# Patient Record
Sex: Male | Born: 1950 | Race: White | Hispanic: No | Smoking: Never smoker
Health system: Southern US, Community
[De-identification: ages and names within clinical notes are randomized; demographics above are authoritative.]

## PROBLEM LIST (undated history)

## (undated) DIAGNOSIS — E079 Disorder of thyroid, unspecified: Secondary | ICD-10-CM

## (undated) DIAGNOSIS — F411 Generalized anxiety disorder: Secondary | ICD-10-CM

## (undated) DIAGNOSIS — S78119A Complete traumatic amputation at level between unspecified hip and knee, initial encounter: Secondary | ICD-10-CM

## (undated) DIAGNOSIS — Z87442 Personal history of urinary calculi: Secondary | ICD-10-CM

## (undated) DIAGNOSIS — N529 Male erectile dysfunction, unspecified: Secondary | ICD-10-CM

## (undated) DIAGNOSIS — S62109A Fracture of unspecified carpal bone, unspecified wrist, initial encounter for closed fracture: Secondary | ICD-10-CM

## (undated) DIAGNOSIS — K7460 Unspecified cirrhosis of liver: Secondary | ICD-10-CM

## (undated) DIAGNOSIS — B1710 Acute hepatitis C without hepatic coma: Secondary | ICD-10-CM

## (undated) DIAGNOSIS — G547 Phantom limb syndrome without pain: Secondary | ICD-10-CM

## (undated) DIAGNOSIS — G8929 Other chronic pain: Secondary | ICD-10-CM

## (undated) DIAGNOSIS — E785 Hyperlipidemia, unspecified: Secondary | ICD-10-CM

## (undated) HISTORY — DX: Male erectile dysfunction, unspecified: N52.9

## (undated) HISTORY — DX: Disorder of thyroid, unspecified: E07.9

## (undated) HISTORY — DX: Other chronic pain: G89.29

## (undated) HISTORY — DX: Hypocalcemia: E83.51

## (undated) HISTORY — DX: Unspecified cirrhosis of liver: K74.60

## (undated) HISTORY — PX: ABOVE KNEE LEG AMPUTATION: SUR20

## (undated) HISTORY — DX: Generalized anxiety disorder: F41.1

## (undated) HISTORY — DX: Personal history of urinary calculi: Z87.442

## (undated) HISTORY — DX: Phantom limb syndrome without pain: G54.7

## (undated) HISTORY — DX: Hyperlipidemia, unspecified: E78.5

## (undated) HISTORY — DX: Fracture of unspecified carpal bone, unspecified wrist, initial encounter for closed fracture: S62.109A

## (undated) HISTORY — DX: Complete traumatic amputation at level between unspecified hip and knee, initial encounter: S78.119A

## (undated) HISTORY — DX: Acute hepatitis C without hepatic coma: B17.10

---

## 1995-05-13 LAB — HM COLONOSCOPY

## 1999-05-24 ENCOUNTER — Encounter (HOSPITAL_COMMUNITY): Admission: RE | Admit: 1999-05-24 | Discharge: 1999-08-22 | Payer: Self-pay | Admitting: Gastroenterology

## 1999-08-31 ENCOUNTER — Encounter (HOSPITAL_COMMUNITY): Admission: RE | Admit: 1999-08-31 | Discharge: 1999-11-29 | Payer: Self-pay | Admitting: Gastroenterology

## 1999-11-27 ENCOUNTER — Encounter (HOSPITAL_COMMUNITY): Admission: RE | Admit: 1999-11-27 | Discharge: 2000-02-25 | Payer: Self-pay | Admitting: Gastroenterology

## 2004-08-21 ENCOUNTER — Emergency Department (HOSPITAL_COMMUNITY): Admission: EM | Admit: 2004-08-21 | Discharge: 2004-08-21 | Payer: Self-pay | Admitting: Family Medicine

## 2005-03-15 ENCOUNTER — Emergency Department (HOSPITAL_COMMUNITY): Admission: EM | Admit: 2005-03-15 | Discharge: 2005-03-16 | Payer: Self-pay | Admitting: Emergency Medicine

## 2005-03-19 ENCOUNTER — Inpatient Hospital Stay (HOSPITAL_COMMUNITY): Admission: EM | Admit: 2005-03-19 | Discharge: 2005-04-02 | Payer: Self-pay | Admitting: *Deleted

## 2005-03-19 ENCOUNTER — Ambulatory Visit: Payer: Self-pay | Admitting: Infectious Diseases

## 2005-03-20 ENCOUNTER — Encounter (INDEPENDENT_AMBULATORY_CARE_PROVIDER_SITE_OTHER): Payer: Self-pay | Admitting: *Deleted

## 2005-03-26 ENCOUNTER — Ambulatory Visit: Payer: Self-pay | Admitting: Cardiology

## 2005-03-30 ENCOUNTER — Encounter (INDEPENDENT_AMBULATORY_CARE_PROVIDER_SITE_OTHER): Payer: Self-pay | Admitting: *Deleted

## 2005-06-26 ENCOUNTER — Ambulatory Visit: Payer: Self-pay | Admitting: Endocrinology

## 2005-07-02 ENCOUNTER — Encounter: Admission: RE | Admit: 2005-07-02 | Discharge: 2005-09-07 | Payer: Self-pay | Admitting: Orthopedic Surgery

## 2005-07-10 ENCOUNTER — Ambulatory Visit: Payer: Self-pay | Admitting: Oncology

## 2005-07-17 ENCOUNTER — Ambulatory Visit: Payer: Self-pay | Admitting: Gastroenterology

## 2005-07-27 ENCOUNTER — Encounter (HOSPITAL_COMMUNITY): Payer: Self-pay | Admitting: Oncology

## 2005-07-27 ENCOUNTER — Encounter (INDEPENDENT_AMBULATORY_CARE_PROVIDER_SITE_OTHER): Payer: Self-pay | Admitting: *Deleted

## 2005-07-27 ENCOUNTER — Ambulatory Visit (HOSPITAL_COMMUNITY): Admission: RE | Admit: 2005-07-27 | Discharge: 2005-07-27 | Payer: Self-pay | Admitting: Oncology

## 2005-07-27 ENCOUNTER — Ambulatory Visit: Payer: Self-pay | Admitting: Oncology

## 2005-07-31 ENCOUNTER — Ambulatory Visit: Payer: Self-pay | Admitting: Gastroenterology

## 2005-09-06 ENCOUNTER — Ambulatory Visit: Payer: Self-pay | Admitting: Endocrinology

## 2005-10-05 ENCOUNTER — Ambulatory Visit: Payer: Self-pay | Admitting: Oncology

## 2005-11-19 LAB — CBC WITH DIFFERENTIAL/PLATELET
BASO%: 0.5 % (ref 0.0–2.0)
EOS%: 2.6 % (ref 0.0–7.0)
LYMPH%: 29.8 % (ref 14.0–48.0)
MCH: 37.4 pg — ABNORMAL HIGH (ref 28.0–33.4)
MCHC: 35.6 g/dL (ref 32.0–35.9)
MONO#: 0.3 10*3/uL (ref 0.1–0.9)
RBC: 3.21 10*6/uL — ABNORMAL LOW (ref 4.20–5.71)
WBC: 3.1 10*3/uL — ABNORMAL LOW (ref 4.0–10.0)
lymph#: 0.9 10*3/uL (ref 0.9–3.3)

## 2005-11-22 LAB — LACTATE DEHYDROGENASE: LDH: 209 U/L (ref 94–250)

## 2005-11-22 LAB — COMPREHENSIVE METABOLIC PANEL
ALT: 34 U/L (ref 0–40)
AST: 28 U/L (ref 0–37)
CO2: 21 mEq/L (ref 19–32)
Chloride: 104 mEq/L (ref 96–112)
Creatinine, Ser: 0.8 mg/dL (ref 0.4–1.5)
Sodium: 137 mEq/L (ref 135–145)
Total Bilirubin: 1.7 mg/dL — ABNORMAL HIGH (ref 0.3–1.2)
Total Protein: 7.7 g/dL (ref 6.0–8.3)

## 2005-11-22 LAB — PNH PROFILE (-HIGH SENSITIVITY)
% CD55: 100 %
% CD59: 100 %

## 2005-11-23 LAB — HAPTOGLOBIN: Haptoglobin: <6 mg/dL — ABNORMAL LOW (ref 16–200)

## 2005-11-23 LAB — HEMOCHROMATOSIS DNA-PCR(C282Y,H63D)

## 2006-02-14 ENCOUNTER — Ambulatory Visit: Payer: Self-pay | Admitting: Oncology

## 2006-05-04 ENCOUNTER — Emergency Department (HOSPITAL_COMMUNITY): Admission: EM | Admit: 2006-05-04 | Discharge: 2006-05-04 | Payer: Self-pay | Admitting: Emergency Medicine

## 2006-05-24 ENCOUNTER — Inpatient Hospital Stay (HOSPITAL_COMMUNITY): Admission: EM | Admit: 2006-05-24 | Discharge: 2006-05-31 | Payer: Self-pay | Admitting: *Deleted

## 2006-05-27 ENCOUNTER — Encounter (INDEPENDENT_AMBULATORY_CARE_PROVIDER_SITE_OTHER): Payer: Self-pay | Admitting: Interventional Cardiology

## 2006-05-28 ENCOUNTER — Encounter (INDEPENDENT_AMBULATORY_CARE_PROVIDER_SITE_OTHER): Payer: Self-pay | Admitting: Specialist

## 2006-06-03 ENCOUNTER — Ambulatory Visit: Payer: Self-pay | Admitting: Endocrinology

## 2006-06-23 ENCOUNTER — Inpatient Hospital Stay (HOSPITAL_COMMUNITY): Admission: EM | Admit: 2006-06-23 | Discharge: 2006-06-25 | Payer: Self-pay | Admitting: Emergency Medicine

## 2006-07-06 ENCOUNTER — Observation Stay (HOSPITAL_COMMUNITY): Admission: EM | Admit: 2006-07-06 | Discharge: 2006-07-07 | Payer: Self-pay | Admitting: Emergency Medicine

## 2006-07-07 ENCOUNTER — Ambulatory Visit: Payer: Self-pay | Admitting: Internal Medicine

## 2006-07-07 ENCOUNTER — Ambulatory Visit: Payer: Self-pay | Admitting: Oncology

## 2006-07-09 ENCOUNTER — Ambulatory Visit: Payer: Self-pay | Admitting: Oncology

## 2006-07-09 LAB — FERRITIN: Ferritin: 1082 ng/mL — ABNORMAL HIGH (ref 22–322)

## 2006-07-09 LAB — CBC & DIFF AND RETIC
Basophils Absolute: 0 10*3/uL (ref 0.0–0.1)
Eosinophils Absolute: 0.1 10*3/uL (ref 0.0–0.5)
HCT: 26.5 % — ABNORMAL LOW (ref 38.7–49.9)
IRF: 0.49 — ABNORMAL HIGH (ref 0.070–0.380)
LYMPH%: 22.2 % (ref 14.0–48.0)
MCH: 31 pg (ref 28.0–33.4)
MCV: 89.7 fL (ref 81.6–98.0)
MONO%: 5.5 % (ref 0.0–13.0)
NEUT#: 2.3 10*3/uL (ref 1.5–6.5)
NEUT%: 69 % (ref 40.0–75.0)
Platelets: 81 10*3/uL — ABNORMAL LOW (ref 145–400)
RDW: 18.7 % — ABNORMAL HIGH (ref 11.2–14.6)
RETIC #: 127.3 10*3/uL — ABNORMAL HIGH (ref 31.8–103.9)
WBC: 3.4 10*3/uL — ABNORMAL LOW (ref 4.0–10.0)
lymph#: 0.8 10*3/uL — ABNORMAL LOW (ref 0.9–3.3)

## 2006-07-09 LAB — MORPHOLOGY: PLT EST: DECREASED

## 2006-07-09 LAB — COMPREHENSIVE METABOLIC PANEL
AST: 14 U/L (ref 0–37)
CO2: 25 mEq/L (ref 19–32)
Chloride: 99 mEq/L (ref 96–112)
Glucose, Bld: 124 mg/dL — ABNORMAL HIGH (ref 70–99)
Total Bilirubin: 2.2 mg/dL — ABNORMAL HIGH (ref 0.3–1.2)
Total Protein: 8.1 g/dL (ref 6.0–8.3)

## 2006-07-09 LAB — ERYTHROCYTE SEDIMENTATION RATE: Sed Rate: 104 mm/hr — ABNORMAL HIGH (ref 0–20)

## 2006-07-09 LAB — IRON AND TIBC
%SAT: 22 % (ref 20–55)
TIBC: 277 ug/dL (ref 215–435)

## 2006-07-09 LAB — HAPTOGLOBIN: Haptoglobin: 154 mg/dL (ref 16–200)

## 2006-07-09 LAB — CHCC SMEAR

## 2006-07-15 ENCOUNTER — Encounter (HOSPITAL_COMMUNITY): Admission: RE | Admit: 2006-07-15 | Discharge: 2006-07-25 | Payer: Self-pay | Admitting: Oncology

## 2006-07-15 LAB — CBC WITH DIFFERENTIAL/PLATELET
BASO%: 0.4 % (ref 0.0–2.0)
EOS%: 2.7 % (ref 0.0–7.0)
MCH: 31.3 pg (ref 28.0–33.4)
MCHC: 34.7 g/dL (ref 32.0–35.9)
MONO#: 0.2 10*3/uL (ref 0.1–0.9)
RBC: 2.84 10*6/uL — ABNORMAL LOW (ref 4.20–5.71)
RDW: 18.8 % — ABNORMAL HIGH (ref 11.2–14.6)
WBC: 3.3 10*3/uL — ABNORMAL LOW (ref 4.0–10.0)
lymph#: 0.7 10*3/uL — ABNORMAL LOW (ref 0.9–3.3)

## 2006-07-17 LAB — TYPE & CROSSMATCH - CHCC

## 2006-07-26 LAB — CBC & DIFF AND RETIC
BASO%: 0.5 % (ref 0.0–2.0)
EOS%: 3.2 % (ref 0.0–7.0)
HCT: 33.7 % — ABNORMAL LOW (ref 38.7–49.9)
LYMPH%: 20.6 % (ref 14.0–48.0)
MCH: 31 pg (ref 28.0–33.4)
MCHC: 34.6 g/dL (ref 32.0–35.9)
MCV: 89.6 fL (ref 81.6–98.0)
MONO%: 7.1 % (ref 0.0–13.0)
NEUT%: 68.6 % (ref 40.0–75.0)
lymph#: 1 10*3/uL (ref 0.9–3.3)

## 2006-07-26 LAB — COMPREHENSIVE METABOLIC PANEL
Albumin: 3.9 g/dL (ref 3.5–5.2)
BUN: 13 mg/dL (ref 6–23)
CO2: 26 mEq/L (ref 19–32)
Calcium: 8.6 mg/dL (ref 8.4–10.5)
Chloride: 100 mEq/L (ref 96–112)
Glucose, Bld: 102 mg/dL — ABNORMAL HIGH (ref 70–99)
Potassium: 3.8 mEq/L (ref 3.5–5.3)
Total Protein: 7.3 g/dL (ref 6.0–8.3)

## 2006-07-26 LAB — HAPTOGLOBIN: Haptoglobin: 53 mg/dL (ref 16–200)

## 2006-08-02 LAB — CBC WITH DIFFERENTIAL/PLATELET
BASO%: 0.2 % (ref 0.0–2.0)
Basophils Absolute: 0 10*3/uL (ref 0.0–0.1)
HCT: 30.6 % — ABNORMAL LOW (ref 38.7–49.9)
HGB: 10.4 g/dL — ABNORMAL LOW (ref 13.0–17.1)
LYMPH%: 18.7 % (ref 14.0–48.0)
MCHC: 33.9 g/dL (ref 32.0–35.9)
MONO#: 0.1 10*3/uL (ref 0.1–0.9)
NEUT%: 70.2 % (ref 40.0–75.0)
Platelets: 66 10*3/uL — ABNORMAL LOW (ref 145–400)
WBC: 2.5 10*3/uL — ABNORMAL LOW (ref 4.0–10.0)

## 2006-08-27 ENCOUNTER — Ambulatory Visit: Payer: Self-pay | Admitting: Oncology

## 2006-10-14 ENCOUNTER — Ambulatory Visit: Payer: Self-pay | Admitting: Oncology

## 2006-10-16 LAB — CBC WITH DIFFERENTIAL/PLATELET
BASO%: 0.8 % (ref 0.0–2.0)
Basophils Absolute: 0 10*3/uL (ref 0.0–0.1)
EOS%: 2.3 % (ref 0.0–7.0)
HCT: 27.1 % — ABNORMAL LOW (ref 38.7–49.9)
LYMPH%: 24.5 % (ref 14.0–48.0)
MCH: 36.1 pg — ABNORMAL HIGH (ref 28.0–33.4)
MCHC: 36.3 g/dL — ABNORMAL HIGH (ref 32.0–35.9)
MCV: 99.6 fL — ABNORMAL HIGH (ref 81.6–98.0)
MONO%: 9.8 % (ref 0.0–13.0)
NEUT%: 62.6 % (ref 40.0–75.0)
Platelets: 69 10*3/uL — ABNORMAL LOW (ref 145–400)
lymph#: 0.8 10*3/uL — ABNORMAL LOW (ref 0.9–3.3)

## 2006-11-15 LAB — CBC WITH DIFFERENTIAL/PLATELET
Eosinophils Absolute: 0.1 10*3/uL (ref 0.0–0.5)
HCT: 29.8 % — ABNORMAL LOW (ref 38.7–49.9)
HGB: 10.7 g/dL — ABNORMAL LOW (ref 13.0–17.1)
LYMPH%: 24.6 % (ref 14.0–48.0)
MONO#: 0.3 10*3/uL (ref 0.1–0.9)
NEUT#: 3 10*3/uL (ref 1.5–6.5)
Platelets: 66 10*3/uL — ABNORMAL LOW (ref 145–400)
RBC: 3.07 10*6/uL — ABNORMAL LOW (ref 4.20–5.71)
WBC: 4.6 10*3/uL (ref 4.0–10.0)

## 2006-11-26 ENCOUNTER — Ambulatory Visit: Payer: Self-pay | Admitting: Oncology

## 2006-11-28 LAB — CBC WITH DIFFERENTIAL/PLATELET
BASO%: 0.4 % (ref 0.0–2.0)
Eosinophils Absolute: 0.1 10*3/uL (ref 0.0–0.5)
HCT: 25.9 % — ABNORMAL LOW (ref 38.7–49.9)
LYMPH%: 26.3 % (ref 14.0–48.0)
MCHC: 36 g/dL — ABNORMAL HIGH (ref 32.0–35.9)
MONO#: 0.4 10*3/uL (ref 0.1–0.9)
NEUT%: 61.3 % (ref 40.0–75.0)
Platelets: 84 10*3/uL — ABNORMAL LOW (ref 145–400)
WBC: 3.8 10*3/uL — ABNORMAL LOW (ref 4.0–10.0)

## 2006-12-12 LAB — CBC WITH DIFFERENTIAL/PLATELET
BASO%: 0.8 % (ref 0.0–2.0)
EOS%: 2.1 % (ref 0.0–7.0)
HCT: 30.1 % — ABNORMAL LOW (ref 38.7–49.9)
LYMPH%: 18.6 % (ref 14.0–48.0)
MCH: 34.1 pg — ABNORMAL HIGH (ref 28.0–33.4)
MCHC: 35.1 g/dL (ref 32.0–35.9)
MCV: 97.3 fL (ref 81.6–98.0)
MONO%: 4.7 % (ref 0.0–13.0)
NEUT%: 73.8 % (ref 40.0–75.0)
Platelets: 81 10*3/uL — ABNORMAL LOW (ref 145–400)

## 2006-12-26 LAB — CBC WITH DIFFERENTIAL/PLATELET
BASO%: 0.2 % (ref 0.0–2.0)
EOS%: 2.5 % (ref 0.0–7.0)
MCH: 32.3 pg (ref 28.0–33.4)
MCHC: 35.9 g/dL (ref 32.0–35.9)
MONO%: 4 % (ref 0.0–13.0)
NEUT%: 81.3 % — ABNORMAL HIGH (ref 40.0–75.0)
RDW: 20.5 % — ABNORMAL HIGH (ref 11.2–14.6)
lymph#: 0.6 10*3/uL — ABNORMAL LOW (ref 0.9–3.3)

## 2007-01-09 LAB — CBC WITH DIFFERENTIAL/PLATELET
Basophils Absolute: 0 10*3/uL (ref 0.0–0.1)
EOS%: 4.4 % (ref 0.0–7.0)
HGB: 11.1 g/dL — ABNORMAL LOW (ref 13.0–17.1)
MCH: 33.1 pg (ref 28.0–33.4)
NEUT#: 2.3 10*3/uL (ref 1.5–6.5)
RBC: 3.36 10*6/uL — ABNORMAL LOW (ref 4.20–5.71)
RDW: 22.3 % — ABNORMAL HIGH (ref 11.2–14.6)
lymph#: 0.6 10*3/uL — ABNORMAL LOW (ref 0.9–3.3)

## 2007-01-09 LAB — COMPREHENSIVE METABOLIC PANEL
ALT: 20 U/L (ref 0–53)
AST: 20 U/L (ref 0–37)
Alkaline Phosphatase: 97 U/L (ref 39–117)
BUN: 10 mg/dL (ref 6–23)
Creatinine, Ser: 0.63 mg/dL (ref 0.40–1.50)
Total Bilirubin: 1.9 mg/dL — ABNORMAL HIGH (ref 0.3–1.2)

## 2007-01-09 LAB — ERYTHROCYTE SEDIMENTATION RATE: Sed Rate: 46 mm/hr — ABNORMAL HIGH (ref 0–20)

## 2007-01-14 ENCOUNTER — Ambulatory Visit: Payer: Self-pay | Admitting: Oncology

## 2007-01-30 LAB — CBC WITH DIFFERENTIAL/PLATELET
Basophils Absolute: 0 10*3/uL (ref 0.0–0.1)
Eosinophils Absolute: 0.1 10*3/uL (ref 0.0–0.5)
HGB: 11.4 g/dL — ABNORMAL LOW (ref 13.0–17.1)
LYMPH%: 37.8 % (ref 14.0–48.0)
MCV: 97.8 fL (ref 81.6–98.0)
MONO#: 0.2 10*3/uL (ref 0.1–0.9)
MONO%: 8.1 % (ref 0.0–13.0)
NEUT#: 1 10*3/uL — ABNORMAL LOW (ref 1.5–6.5)
Platelets: 42 10*3/uL — ABNORMAL LOW (ref 145–400)
RDW: 22.8 % — ABNORMAL HIGH (ref 11.2–14.6)
WBC: 1.9 10*3/uL — ABNORMAL LOW (ref 4.0–10.0)

## 2007-02-20 LAB — COMPREHENSIVE METABOLIC PANEL
Albumin: 4.9 g/dL (ref 3.5–5.2)
Alkaline Phosphatase: 107 U/L (ref 39–117)
CO2: 24 mEq/L (ref 19–32)
Calcium: 9 mg/dL (ref 8.4–10.5)
Chloride: 106 mEq/L (ref 96–112)
Glucose, Bld: 96 mg/dL (ref 70–99)
Potassium: 3.8 mEq/L (ref 3.5–5.3)
Sodium: 142 mEq/L (ref 135–145)
Total Protein: 7.9 g/dL (ref 6.0–8.3)

## 2007-02-20 LAB — CBC WITH DIFFERENTIAL/PLATELET
Basophils Absolute: 0 10*3/uL (ref 0.0–0.1)
Eosinophils Absolute: 0.1 10*3/uL (ref 0.0–0.5)
HCT: 37.3 % — ABNORMAL LOW (ref 38.7–49.9)
HGB: 13.5 g/dL (ref 13.0–17.1)
MONO#: 0.3 10*3/uL (ref 0.1–0.9)
NEUT#: 2.1 10*3/uL (ref 1.5–6.5)
RDW: 20.4 % — ABNORMAL HIGH (ref 11.2–14.6)
lymph#: 1.2 10*3/uL (ref 0.9–3.3)

## 2007-03-04 ENCOUNTER — Encounter: Admission: RE | Admit: 2007-03-04 | Discharge: 2007-06-02 | Payer: Self-pay | Admitting: Orthopedic Surgery

## 2007-03-11 ENCOUNTER — Ambulatory Visit: Payer: Self-pay | Admitting: Oncology

## 2007-03-24 ENCOUNTER — Encounter: Payer: Self-pay | Admitting: Endocrinology

## 2007-03-24 DIAGNOSIS — B1710 Acute hepatitis C without hepatic coma: Secondary | ICD-10-CM | POA: Insufficient documentation

## 2007-03-24 DIAGNOSIS — F419 Anxiety disorder, unspecified: Secondary | ICD-10-CM | POA: Insufficient documentation

## 2007-03-24 DIAGNOSIS — F411 Generalized anxiety disorder: Secondary | ICD-10-CM

## 2007-03-24 DIAGNOSIS — K7460 Unspecified cirrhosis of liver: Secondary | ICD-10-CM

## 2007-03-24 HISTORY — DX: Acute hepatitis C without hepatic coma: B17.10

## 2007-03-24 HISTORY — DX: Generalized anxiety disorder: F41.1

## 2007-03-24 HISTORY — DX: Unspecified cirrhosis of liver: K74.60

## 2007-04-22 ENCOUNTER — Ambulatory Visit: Payer: Self-pay | Admitting: Oncology

## 2007-05-22 ENCOUNTER — Encounter: Admission: RE | Admit: 2007-05-22 | Discharge: 2007-07-30 | Payer: Self-pay | Admitting: Orthopedic Surgery

## 2007-06-30 LAB — CONVERTED CEMR LAB: PSA: NORMAL ng/mL

## 2007-07-04 ENCOUNTER — Ambulatory Visit: Payer: Self-pay | Admitting: Oncology

## 2007-07-04 ENCOUNTER — Encounter: Payer: Self-pay | Admitting: Endocrinology

## 2007-07-04 LAB — ERYTHROCYTE SEDIMENTATION RATE: Sed Rate: 22 mm/hr — ABNORMAL HIGH (ref 0–20)

## 2007-07-04 LAB — COMPREHENSIVE METABOLIC PANEL
ALT: 92 U/L — ABNORMAL HIGH (ref 0–53)
AST: 67 U/L — ABNORMAL HIGH (ref 0–37)
Albumin: 4.4 g/dL (ref 3.5–5.2)
Alkaline Phosphatase: 99 U/L (ref 39–117)
Glucose, Bld: 124 mg/dL — ABNORMAL HIGH (ref 70–99)
Potassium: 3.7 mEq/L (ref 3.5–5.3)
Sodium: 139 mEq/L (ref 135–145)
Total Protein: 7.4 g/dL (ref 6.0–8.3)

## 2007-07-04 LAB — CBC WITH DIFFERENTIAL/PLATELET
BASO%: 2.9 % — ABNORMAL HIGH (ref 0.0–2.0)
LYMPH%: 22.5 % (ref 14.0–48.0)
MCHC: 36.5 g/dL — ABNORMAL HIGH (ref 32.0–35.9)
MONO#: 0.3 10*3/uL (ref 0.1–0.9)
MONO%: 9.6 % (ref 0.0–13.0)
Platelets: 56 10*3/uL — ABNORMAL LOW (ref 145–400)
RBC: 3.19 10*6/uL — ABNORMAL LOW (ref 4.20–5.71)
WBC: 2.7 10*3/uL — ABNORMAL LOW (ref 4.0–10.0)

## 2007-07-08 LAB — PSA, MEDICARE: PSA: 0.51 ng/mL (ref 0.10–4.00)

## 2007-10-30 ENCOUNTER — Ambulatory Visit: Payer: Self-pay | Admitting: Oncology

## 2007-11-04 ENCOUNTER — Encounter: Payer: Self-pay | Admitting: Endocrinology

## 2007-11-04 LAB — CBC WITH DIFFERENTIAL/PLATELET
BASO%: 1.1 % (ref 0.0–2.0)
EOS%: 2.8 % (ref 0.0–7.0)
HCT: 33.3 % — ABNORMAL LOW (ref 38.7–49.9)
MCHC: 36.4 g/dL — ABNORMAL HIGH (ref 32.0–35.9)
NEUT#: 1.2 10*3/uL — ABNORMAL LOW (ref 1.5–6.5)
WBC: 2.4 10*3/uL — ABNORMAL LOW (ref 4.0–10.0)
lymph#: 0.9 10*3/uL (ref 0.9–3.3)

## 2007-11-04 LAB — COMPREHENSIVE METABOLIC PANEL
AST: 48 U/L — ABNORMAL HIGH (ref 0–37)
BUN: 9 mg/dL (ref 6–23)
CO2: 26 mEq/L (ref 19–32)
Calcium: 9.2 mg/dL (ref 8.4–10.5)
Chloride: 105 mEq/L (ref 96–112)
Glucose, Bld: 122 mg/dL — ABNORMAL HIGH (ref 70–99)

## 2007-12-23 ENCOUNTER — Ambulatory Visit: Payer: Self-pay | Admitting: Endocrinology

## 2007-12-23 DIAGNOSIS — S78119A Complete traumatic amputation at level between unspecified hip and knee, initial encounter: Secondary | ICD-10-CM

## 2007-12-23 DIAGNOSIS — Z87442 Personal history of urinary calculi: Secondary | ICD-10-CM

## 2007-12-23 DIAGNOSIS — N529 Male erectile dysfunction, unspecified: Secondary | ICD-10-CM | POA: Insufficient documentation

## 2007-12-23 HISTORY — DX: Personal history of urinary calculi: Z87.442

## 2007-12-23 HISTORY — DX: Male erectile dysfunction, unspecified: N52.9

## 2007-12-23 HISTORY — DX: Hypocalcemia: E83.51

## 2007-12-23 HISTORY — DX: Complete traumatic amputation at level between unspecified hip and knee, initial encounter: S78.119A

## 2007-12-24 ENCOUNTER — Ambulatory Visit: Payer: Self-pay | Admitting: Endocrinology

## 2007-12-24 LAB — CONVERTED CEMR LAB
BUN: 9 mg/dL (ref 6–23)
CO2: 27 meq/L (ref 19–32)
Calcium: 9.4 mg/dL (ref 8.4–10.5)
Chloride: 108 meq/L (ref 96–112)
GFR calc non Af Amer: 123 mL/min
Glucose, Bld: 95 mg/dL (ref 70–99)

## 2007-12-25 LAB — CONVERTED CEMR LAB: Calcium, Total (PTH): 9.4 mg/dL (ref 8.4–10.5)

## 2008-02-21 ENCOUNTER — Other Ambulatory Visit: Payer: Self-pay

## 2008-02-21 ENCOUNTER — Emergency Department: Payer: Self-pay | Admitting: Emergency Medicine

## 2008-02-27 ENCOUNTER — Encounter: Payer: Self-pay | Admitting: Endocrinology

## 2008-03-02 ENCOUNTER — Ambulatory Visit: Payer: Self-pay | Admitting: Oncology

## 2009-04-28 ENCOUNTER — Encounter: Payer: Self-pay | Admitting: Endocrinology

## 2009-07-22 ENCOUNTER — Emergency Department: Payer: Self-pay

## 2009-07-25 ENCOUNTER — Emergency Department: Payer: Self-pay | Admitting: Emergency Medicine

## 2009-09-12 ENCOUNTER — Telehealth: Payer: Self-pay | Admitting: Endocrinology

## 2009-09-12 ENCOUNTER — Ambulatory Visit: Payer: Self-pay | Admitting: Endocrinology

## 2009-09-12 DIAGNOSIS — E785 Hyperlipidemia, unspecified: Secondary | ICD-10-CM | POA: Insufficient documentation

## 2009-09-12 DIAGNOSIS — G547 Phantom limb syndrome without pain: Secondary | ICD-10-CM | POA: Insufficient documentation

## 2009-09-12 HISTORY — DX: Phantom limb syndrome without pain: G54.7

## 2009-09-12 HISTORY — DX: Hyperlipidemia, unspecified: E78.5

## 2009-09-12 LAB — CONVERTED CEMR LAB: Calcium, Total (PTH): 9.1 mg/dL (ref 8.4–10.5)

## 2009-09-13 LAB — CONVERTED CEMR LAB
Basophils Relative: 0.7 % (ref 0.0–3.0)
CO2: 28 meq/L (ref 19–32)
Calcium: 9 mg/dL (ref 8.4–10.5)
Chloride: 107 meq/L (ref 96–112)
Cholesterol: 116 mg/dL (ref 0–200)
Creatinine, Ser: 0.5 mg/dL (ref 0.4–1.5)
Direct LDL: 22.1 mg/dL
GFR calc non Af Amer: 180.15 mL/min (ref >60)
Glucose, Bld: 100 mg/dL — ABNORMAL HIGH (ref 70–99)
HCT: 36.4 % — ABNORMAL LOW (ref 39.0–52.0)
HDL: 31.8 mg/dL — ABNORMAL LOW (ref >39.00)
Monocytes Absolute: 0.2 10*3/uL (ref 0.1–1.0)
Monocytes Relative: 8.5 % (ref 3.0–12.0)
Neutrophils Relative %: 51.1 % (ref 43.0–77.0)
RBC: 3.37 M/uL — ABNORMAL LOW (ref 4.22–5.81)
RDW: 13.4 % (ref 11.5–14.6)
Sodium: 140 meq/L (ref 135–145)
Total CHOL/HDL Ratio: 4
Triglycerides: 714 mg/dL — ABNORMAL HIGH (ref 0.0–149.0)
WBC: 2.2 10*3/uL — ABNORMAL LOW (ref 4.5–10.5)

## 2009-09-22 ENCOUNTER — Ambulatory Visit: Payer: Self-pay | Admitting: Oncology

## 2009-09-27 ENCOUNTER — Encounter: Payer: Self-pay | Admitting: Endocrinology

## 2009-09-27 ENCOUNTER — Telehealth: Payer: Self-pay | Admitting: Endocrinology

## 2009-09-27 LAB — CBC WITH DIFFERENTIAL/PLATELET
BASO%: 0.4 % (ref 0.0–2.0)
Basophils Absolute: 0 10*3/uL (ref 0.0–0.1)
EOS%: 1.6 % (ref 0.0–7.0)
HCT: 43.2 % (ref 38.4–49.9)
HGB: 15.4 g/dL (ref 13.0–17.1)
MCH: 38.7 pg — ABNORMAL HIGH (ref 27.2–33.4)
MONO%: 7.1 % (ref 0.0–14.0)
RBC: 3.99 10*6/uL — ABNORMAL LOW (ref 4.20–5.82)
WBC: 4.5 10*3/uL (ref 4.0–10.3)
lymph#: 1 10*3/uL (ref 0.9–3.3)

## 2009-09-27 LAB — COMPREHENSIVE METABOLIC PANEL
ALT: 68 U/L — ABNORMAL HIGH (ref 0–53)
Alkaline Phosphatase: 97 U/L (ref 39–117)
BUN: 11 mg/dL (ref 6–23)
CO2: 23 mEq/L (ref 19–32)
Calcium: 9.5 mg/dL (ref 8.4–10.5)
Chloride: 103 mEq/L (ref 96–112)
Creatinine, Ser: 0.7 mg/dL (ref 0.40–1.50)
Sodium: 141 mEq/L (ref 135–145)
Total Bilirubin: 1.6 mg/dL — ABNORMAL HIGH (ref 0.3–1.2)
Total Protein: 8.4 g/dL — ABNORMAL HIGH (ref 6.0–8.3)

## 2009-10-12 ENCOUNTER — Ambulatory Visit: Payer: Self-pay | Admitting: Endocrinology

## 2009-10-12 DIAGNOSIS — S62109A Fracture of unspecified carpal bone, unspecified wrist, initial encounter for closed fracture: Secondary | ICD-10-CM | POA: Insufficient documentation

## 2009-10-12 HISTORY — DX: Fracture of unspecified carpal bone, unspecified wrist, initial encounter for closed fracture: S62.109A

## 2009-10-13 ENCOUNTER — Telehealth (INDEPENDENT_AMBULATORY_CARE_PROVIDER_SITE_OTHER): Payer: Self-pay | Admitting: *Deleted

## 2009-10-19 ENCOUNTER — Encounter: Payer: Self-pay | Admitting: Endocrinology

## 2009-10-20 ENCOUNTER — Encounter (INDEPENDENT_AMBULATORY_CARE_PROVIDER_SITE_OTHER): Payer: Self-pay | Admitting: *Deleted

## 2009-10-20 ENCOUNTER — Encounter: Payer: Self-pay | Admitting: Endocrinology

## 2009-10-20 LAB — CONVERTED CEMR LAB
Basophils Relative: 0.01 %
CO2: 24 meq/L
Calcium: 9.9 mg/dL
Eosinophils Relative: 0.04 %
Neutrophils Relative %: 67.6 %
Platelets: 59 10*3/uL
RBC: 3.75 M/uL
RDW: 15.5 %
WBC: 3.6 10*3/uL

## 2009-10-21 ENCOUNTER — Encounter: Payer: Self-pay | Admitting: Endocrinology

## 2009-11-22 ENCOUNTER — Encounter (INDEPENDENT_AMBULATORY_CARE_PROVIDER_SITE_OTHER): Payer: Self-pay | Admitting: *Deleted

## 2010-01-03 ENCOUNTER — Encounter: Payer: Self-pay | Admitting: Endocrinology

## 2010-01-31 ENCOUNTER — Ambulatory Visit: Payer: Self-pay | Admitting: Oncology

## 2010-04-20 ENCOUNTER — Encounter: Payer: Self-pay | Admitting: Endocrinology

## 2010-08-15 ENCOUNTER — Encounter: Payer: Self-pay | Admitting: Endocrinology

## 2010-08-31 NOTE — Letter (Signed)
Summary: [facility]  [employer]   Imported By: Lester [place] 11/16/2009 09:32:47  _____________________________________________________________________  External Attachment:    Type:   Image     Comment:   External Document

## 2010-08-31 NOTE — Letter (Signed)
Summary: [facility]  [employer]   Imported By: Lester [place] 10/25/2009 08:37:02  _____________________________________________________________________  External Attachment:    Type:   Image     Comment:   External Document

## 2010-08-31 NOTE — Assessment & Plan Note (Signed)
Summary: EVAL FOR POWER CHAIR/ NWS   Vital Signs:  Patient profile:   60 year old male Height:      72 inches (182.88 cm) Weight:      198.13 pounds (90.06 kg) O2 Sat:      96 % on Room air Temp:     97.7 degrees F (36.50 degrees C) oral Pulse rate:   77 / minute BP sitting:   138 / 82  (right arm) Cuff size:   large  Vitals Entered By: Josph Macho RMA (October 12, 2009 10:22 AM)  O2 Flow:  Room air CC: Evaluation for power chair/ pt thinks he wants to use [facility] [facility] instead of [employer]/ CF Is Patient Diabetic? No   CC:  Evaluation for power chair/ pt thinks he wants to use [employer] instead of [employer]/ CF.  History of Present Illness: pt states he has few mos of moderate pain at both wrists (left > right), after a fall.  he could not undergo surgical repain due to h/o staphlococcal infections and thrombocytopenia.  there is slight associated numbness at the left hand.  Current Medications (verified): 1)  Centrum Silver   Tabs (Multiple Vitamins-Minerals) .... Take 1 By Mouth Qd 2)  Alprazolam 1 Mg  Tabs (Alprazolam) .... Take 1 By Mouth Three Times A Day Qd 3)  Oxycodone Hcl 5 Mg  Tabs (Oxycodone Hcl) .... Take 1 Three Times A Day Once Daily Prn 4)  Viagra 100 Mg  Tabs (Sildenafil Citrate) .... For As Needed Use 5)  Xanax 1 Mg Tabs (Alprazolam) .Marland Kitchen.. 1 in Am and 2 in Pm 6)  Morphine Sulfate 30 Mg Tabs (Morphine Sulfate)  Allergies (verified): No Known Drug Allergies  Review of Systems  The patient denies syncope.         he has chronic gait instability due to his high right aka  Physical Exam  General:  no distress  Msk:  the left wrist is slightly swollen. strength is 4/5 at the right wrist, and 3/5 at the left wrist the wrists are nontender.  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  Problem # 3:  PANCYTOPENIA (ICD-284.1) this limits his medical operability  Problem # 4:  h/o staphlococcal infection this also limits his medical operability  Other Orders: Est. Patient Level IV (16109)  Patient Instructions: 1)  i did form for power chair or scooter

## 2010-08-31 NOTE — Letter (Signed)
Summary: [facility]   Imported By: Lester [place] 01/10/2010 15:16:25  _____________________________________________________________________  External Attachment:    Type:   Image     Comment:   External Document

## 2010-08-31 NOTE — Letter (Signed)
Summary: [employer]  [employer]   Imported By: Sherian Rein 10/17/2009 08:01:31  _____________________________________________________________________  External Attachment:    Type:   Image     Comment:   External Document

## 2010-08-31 NOTE — Progress Notes (Signed)
----   Converted from flag ---- ---- 09/12/2009 11:33 AM, Josph Macho RMA wrote: Platelet count 39 White count 2.2 ------------------------------  please call pt.  platelets are lower (39,000).  i have requested a f/u appt with dr Arline Asp.  Appended Document:  Left message for pt to call office.  Appended Document:  pt informed of results and referral, will expect call from [facility]

## 2010-08-31 NOTE — Progress Notes (Signed)
Summary: Sports administrator of Call: Faxed completed paperwork to [employer] and sent a copy to be scanned. Initial call taken by: Josph Macho RMA,  October 13, 2009 8:27 AM     Appended Document: [facility] [facility] Faxed Product Description paperwork to the [employer]. Sent a copy to be scanned.

## 2010-08-31 NOTE — Letter (Signed)
[facility]   Imported By: Lester [place] 10/13/2009 08:37:44  _____________________________________________________________________  External Attachment:    Type:   Image     Comment:   External Document

## 2010-08-31 NOTE — Letter (Signed)
[facility]   Imported By: Sherian Rein 05/17/2010 09:16:12  _____________________________________________________________________  External Attachment:    Type:   Image     Comment:   External Document

## 2010-08-31 NOTE — Letter (Signed)
Summary: Revised CMN for Wheelchair[facility]  Revised CMN for Wheelchair[facility]   Imported By: Lanelle Bal 10/24/2009 11:47:21  _____________________________________________________________________  External Attachment:    Type:   Image     Comment:   External Document

## 2010-08-31 NOTE — Letter (Signed)
[facility]   Imported By: Sherian Rein 11/25/2009 09:09:46  _____________________________________________________________________  External Attachment:    Type:   Image     Comment:   External Document

## 2010-08-31 NOTE — Letter (Signed)
Summary: Revised CMN for Power Wheelchair/[facility]  Revised CMN for [employer] Wheel[employer]/[facility]   Imported By: Sherian Rein 10/25/2009 10:22:31  _____________________________________________________________________  External Attachment:    Type:   Image     Comment:   External Document

## 2010-08-31 NOTE — Letter (Signed)
Summary: [employer] [facility]  [employer] [facility]   Imported By: Sherian Rein 10/24/2009 11:15:02  _____________________________________________________________________  External Attachment:    Type:   Image     Comment:   External Document

## 2010-08-31 NOTE — Assessment & Plan Note (Signed)
Summary: YEARLY FU/ MEDICARE/ MEDICAID/ LABS SAME DAY /NWS #   Vital Signs:  Patient profile:   60 year old male Height:      72 inches (182.88 cm) Weight:      204.13 pounds (92.79 kg) BMI:     27.79 O2 Sat:      97 % on Room air Temp:     96.9 degrees F (36.06 degrees C) oral Pulse rate:   69 / minute BP sitting:   128 / 72  (left arm) Cuff size:   large  Vitals Entered By: Josph Macho CMA (September 12, 2009 8:34 AM)  O2 Flow:  Room air CC: Physical/ Flu vax/ Pt states he is not taking Oxycodone 30mg /CF   CC:  Physical/ Flu vax/ Pt states he is not taking Oxycodone 30mg /CF.  History of Present Illness: here for regular wellness examination.  He's feeling pretty well in general, and does not drink or smoke.  he says he seldom falls, and can do adl's with help (has an Geophysicist/field seismologist at home).   Current Medications (verified): 1)  Centrum Silver   Tabs (Multiple Vitamins-Minerals) .... Take 1 By Mouth Qd 2)  Alprazolam 1 Mg  Tabs (Alprazolam) .... Take 1 By Mouth Three Times A Day Qd 3)  Oxycodone Hcl 30 Mg  Tabs (Oxycodone Hcl) .... Take 1 By Mouth Qd 4)  Oxycodone Hcl 5 Mg  Tabs (Oxycodone Hcl) .... Take 1 Three Times A Day Once Daily Prn 5)  Viagra 100 Mg  Tabs (Sildenafil Citrate) .... For As Needed Use 6)  Xanax 1 Mg Tabs (Alprazolam) .Marland Kitchen.. 1 in Am and 2 in Pm 7)  Morphine Sulfate 30 Mg Tabs (Morphine Sulfate)  Allergies (verified): No Known Drug Allergies  Past History:  Past Medical History: Last updated: 03/24/2007 Anxiety Cirrhosis Hepatitis C Chronic Pain Staph Bacteria Hardware (03/2005) Urolithiasis  Family History: Reviewed history from 12/23/2007 and no changes required. no cancer  Social History: Reviewed history from 12/23/2007 and no changes required. widowed 1998 disabled lives alone  Review of Systems  The patient denies chest pain, abdominal pain, hematochezia, hematuria, and depression.         denies insomnia  Physical  Exam  General:  normal appearance.   Head:  head: no deformity eyes: no periorbital swelling, no proptosis external nose and ears are normal mouth: no lesion seen Ears:  TM's intact and clear with normal canals with grossly normal hearing.   Neck:  Supple without thyroid enlargement or tenderness. No cervical lymphadenopathy, neck masses or tracheal deviation.  Lungs:  Clear to auscultation bilaterally. Normal respiratory effort.  Heart:  Regular rate and rhythm without murmurs or gallops noted. Normal S1,S2.   Abdomen:  abdomen is soft, nontender.  no hepatosplenomegaly.   not distended.  no hernia  Rectal:  normal external and internal exam.  heme neg  Prostate:  Normal size prostate without masses or tenderness.  Msk:  muscle bulk and strength are grossly normal.  no obvious joint swelling.  gait is normal and steady.  left forearm is in a cast  Pulses:  dorsalis pedis intact on the left.  no carotid bruit Extremities:  right aka left foot is normal.  left ankle has ? of charcot joint. Neurologic:  cn 2-12 grossly intact.   readily moves all 4's.   sensation is intact to touch on the feet  Skin:  normal texture and temp.  no rash.  not diaphoretic  Cervical Nodes:  No significant adenopathy.  Psych:  Alert and cooperative; normal mood and affect; normal attention span and concentration.     Impression & Recommendations:  Problem # 1:  ROUTINE GENERAL MEDICAL EXAM@HEALTH  CARE FACL (ICD-V70.0)  Medications Added to Medication List This Visit: 1)  Xanax 1 Mg Tabs (Alprazolam) .Marland Kitchen.. 1 in am and 2 in pm 2)  Morphine Sulfate 30 Mg Tabs (Morphine sulfate)  Other Orders: Flu Vaccine 11yrs + (62952) Administration Flu vaccine - MCR (G0008) EKG w/ Interpretation (93000) T-Parathyroid Hormone, Intact w/ Calcium (84132-44010) Hematology Referral (Hematology) TLB-Lipid Panel (80061-LIPID) TLB-BMP (Basic Metabolic Panel-BMET) (80048-METABOL) TLB-CBC Platelet - w/Differential  (85025-CBCD) TLB-TSH (Thyroid Stimulating Hormone) (84443-TSH) TLB-PSA (Prostate Specific Antigen) (84153-PSA) First annual wellness visit with prevention plan  (U7253) Flu Vaccine Consent Questions     Do you have a history of severe allergic reactions to this vaccine? no    Any prior history of allergic reactions to egg and/or gelatin? no    Do you have a sensitivity to the preservative Thimersol? no    Do you have a past history of Guillan-Barre Syndrome? no    Do you currently have an acute febrile illness? no    Have you ever had a severe reaction to latex? no    Vaccine information given and explained to patient? yes    Are you currently pregnant? no    Lot Number:[id number]   Exp Date:01/26/2010   Site Given  Left Deltoid IM Flu Vaccine 69yrs + (66440) Administration Flu vaccine - MCR (H4742)  Patient Instructions: 1)  please consider these measures for your health:  minimize alcohol.  do not use tobacco products.  have a colonoscopy at least every 10 years from age 1.  keep firearms safely stored.  always use seat belts.  have working smoke alarms in your home.  see the dentist regularly.  never drive under the influence of alcohol or drugs (including prescription drugs).   2)  return 1 year. 3)  blood tests today. 4)  tests are being ordered for you today.  a few days after the test(s), please call [phone] to hear your test results. 5)  we discussed code status.  he defers to his son (same name) 6)  i advised x ray of left ankle.  he says he'll get at ortho. .lbmedflu

## 2010-08-31 NOTE — Progress Notes (Signed)
Summary: referral  Phone Note Call from Patient Call back at Home Phone [phone]   Caller: Patient [facility]  Summary of Call: pt walking into clinic requesting referral to Derm for rash on LT forearm under cast. Pt was seen today at Dr. Park Pope office and was told to request from PCP. Initial call taken by: Margaret Pyle, CMA,  September 27, 2009 9:44 AM  Follow-up for Phone Call        options: refer derm ov here (faster) Follow-up by: Minus Breeding MD,  September 27, 2009 12:24 PM  Additional Follow-up for Phone Call Additional follow up Details #1::        per pt he would prefer referral to Derm because Dr. Doreatha Lew told him this would be best Additional Follow-up by: Margaret Pyle, CMA,  September 27, 2009 1:03 PM  New Problems: SKIN RASH (ICD-782.1)   Additional Follow-up for Phone Call Additional follow up Details #2::    done  Follow-up by: Minus Breeding MD,  September 27, 2009 2:02 PM  New Problems: SKIN RASH (ICD-782.1)

## 2010-09-06 NOTE — Letter (Signed)
[facility]   Imported By: Sherian Rein 09/01/2010 11:53:46  _____________________________________________________________________  External Attachment:    Type:   Image     Comment:   External Document

## 2010-09-12 ENCOUNTER — Encounter: Payer: Self-pay | Admitting: Endocrinology

## 2010-09-26 NOTE — Letter (Signed)
[facility]   Imported By: Sherian Rein 09/20/2010 09:21:09  _____________________________________________________________________  External Attachment:    Type:   Image     Comment:   External Document

## 2010-12-15 NOTE — H&P (Signed)
NAMEINDALECIO, MALMSTROM NO.:  [id number]   MEDICAL RECORD NO.:  [id number]          PATIENT TYPE:  OBV   LOCATION:  1827                         FACILITY:  [facility]   PHYSICIAN:  Bevelyn Buckles. Bensimhon, MDDATE OF BIRTH:  Oct 27, 1950   DATE OF ADMISSION:  07/06/2006  DATE OF DISCHARGE:                              HISTORY & PHYSICAL   PRIMARY CARE PHYSICIAN:  Dr. Everardo All.   HEMATOLOGIST:  Dr. Arline Asp.   REASON FOR ADMISSION:  Pancytopenia, need for transfusion.   Mr. Salamone is a 60 year old male with a history of hepatitis C thought  related to blood transfusion, chronic pancytopenia and remote right  femur fracture 12 years ago, status post repair.  Mr. Hull has  unfortunately had multiple problems with his femur fracture repair site  with disruption of the rods.  He has had previous MRSA infection of the  site.  He is now pending surgical removal of the hardware at [facility] on  July 15, 2006.  He went to [facility] earlier in the week for a preop  workup.  Tonight, he got a call at home telling him that his blood count  was low and that he needed to go to the emergency room for a  transfusion.  He presented here.  He had a CBC drawn which showed a  white count of 3.0 with a hemoglobin of 6.6 and platelets of 68,000.  He  was just at [facility] from November 25 to the 26th and a CBC on  admission showed a white count of 2.9, a hemoglobin of 5.5 and platelets  of 78,000 with a chronically elevated sed rate greater than 140.  He was  admitted on the hospitalist service and transfused 3 units of PRBCs with  a hemoglobin at 8.0.  He presents today saying he feels weak, but denies  any evidence of bleeding.  He has not had melena or bright red blood per  rectum, no cutaneous bleeding or mucosal bleeding.  He denies alcohol  use.  He says although that he was on the hospitalist service  previously, he is followed by Dr. Everardo All and was seen at his office  less than a month  ago for his flu shot and routine maintenance.   REVIEW OF SYSTEMS:  Notable for chronic leg and back pain as well as his  pancytopenia.  He also does have fatigue and it is difficult for him to  get around much.  He denies chest pain or shortness of breath.  As  above, no bleeding, no recent fevers or chills, no nausea or vomiting.  The remainder of the review of systems is negative.   PAST MEDICAL HISTORY:  1. Chronic pancytopenia previously evaluated by Dr. Arline Asp in      Hematology.  2. History of right femur fracture 12 years ago secondary to fall with      previous MRSA infection, now with disruption of the rods and      pending surgical removal of the hardware at [facility].  3. Chronic leg and back pain.  4. Anxiety/depression.   MEDICATIONS:  1. Vicodin  5/500 one tablet t.i.d. as needed.  2. Xanax 1 mg three times a day as needed.  3. Paxil 20 mg a day.  4. Celebrex p.r.n.   ALLERGIES:  No known drug allergies.   SOCIAL HISTORY:  He lives alone.  He is disabled.  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  He denies tobacco or  alcohol currently.  He said he did used to drink when he lost his son.   FAMILY HISTORY:  Mother is alive at age 85.  Father died of a drowning  accident.  He had a brother with hepatitis C and a sister who is alive  and well.   PHYSICAL EXAM:  GENERAL:  He is a somewhat chronically ill-appearing  male in no acute distress.  Respirations are unlabored.  VITAL SIGNS:  Blood pressure is 96/75.  His heart rate is 75.  He is  afebrile.  HEENT:  Sclerae are anicteric.  EOMI.  No xanthelasmas.  Mucous  membranes are moist and oropharynx is clear.  NECK:  Supple.  There is no JVD.  Carotids are 2+ bilaterally without  any bruits.  There is no lymphadenopathy or thyromegaly.  CARDIAC:  He has regular rate and rhythm.  No murmurs, rubs, or gallops.  LUNGS:  Clear.  ABDOMEN:  Soft, nontender and non-distended.  He does appear to  have  mild hepatomegaly.  He has good bowel sounds.  There are no bruits or  masses appreciated.  EXTREMITIES:  Warm with no cyanosis, clubbing or edema.  He does have  some mild deformity of the right knee, but no erythema or fluctuance.  He is mildly tender to palpation.  There are no rashes.  NEUROLOGIC:  He is alert and oriented x3.  Cranial nerves II-XII are  intact.  He moves all extremities without difficulty.  Affect is  somewhat flattened.   LABORATORY DATA:  White count of 3.0, hemoglobin of 6.6, platelets of  68,000.  AST is 16, ALT is 11, alkaline phosphatase is 80, total  bilirubin is 1.3, albumin is 3.1.  Iron levels were normal on June 23, 2006.   ASSESSMENT:  1. Acute on chronic pancytopenia.  2. Hepatitis C presumed secondary to previous blood transfusions.  3. History of right femur fracture with disrupted repair as described      above.  4. Chronic pain.  5. Chronically elevated erythrocyte sedimentation rate.   PLAN/DISCUSSION:  We will bring him in for 23-hour observation for RBC  transfusion.  To me, his pancytopenia seems out of proportion to his  degree of liver dysfunction, given his relatively normal albumin.  I  would suggest outpatient followup with Dr. Arline Asp.  While he is here,  we will check a sed rate and HIV, a TSH, a serum protein electrophoresis  and guaiac his stools.  I suspect he can be discharged home later today.      Bevelyn Buckles. Bensimhon, MD  Electronically Signed     DRB/MEDQ  D:  07/06/2006  T:  07/06/2006  Job:  [id number]   cc:   Gregary Signs A. Everardo All, MD  Samul Dada, M.D.

## 2010-12-15 NOTE — Op Note (Signed)
[name], [name] NO.:  [id number]   MEDICAL RECORD NO.:  [id number]          PATIENT TYPE:  INP   LOCATION:  5527                         FACILITY:  [facility]   PHYSICIAN:  Dyke Brackett, M.D.    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  Again, due to the severe pain, swelling, and warmth of the  shoulder, it was thought to be consistent with possible infection.   PREOPERATIVE DIAGNOSIS:  Septic arthritis, acromioclavicular joint.   POSTOPERATIVE DIAGNOSES:  1. 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  2. Labral tearing.  3. Subacromial bursitis.   PROCEDURES PERFORMED:  1. Arthroscopic debridement of torn labrum.  2. Arthroscopic bursectomy with release of coracoacromial ligament.  3. Open excision, distal clavicle.   SURGEON:  Dyke Brackett, M.D.   DESCRIPTION OF PROCEDURE:  The patient had arthroscopic portals created  laterally, anteriorly, and posteriorly.  Aspiration of the [facility] joint was  not productive of any purulent material.  He was arthroscoped into the  joint, which showed no significant degeneration of the joint, mild  hyperemia of the cuff attachment, with degenerative tear of the labrum,  which was debrided.  The subacromial space was hypertrophied and  inflamed.  There was no evidence of any gross contamination of pus,  however.  This was debrided aggressively.  In view of the significant  erosive changes on the clavicle, this was exposed  through a small  incision.  There was significant erosion of the end of the clavicle, but  no obvious purulence.  A small sliver of the clavicle was excised and  sent to pathology for examination.  There was really no purulent  material to culture.  Closure was affected on the [facility] ligamentous tissue  with 0 Vicryl and the skin with staples, placed in a sling, with lightly  compressive sterile dressing applied.  Taken to the recovery room in  stable condition.      Dyke Brackett, M.D.  Electronically Signed     WDC/MEDQ  D:  05/28/2006  T:  05/29/2006  Job:  [id number]

## 2010-12-15 NOTE — Discharge Summary (Signed)
[name], [name] NO.:  [id number]   MEDICAL RECORD NO.:  [id number]          PATIENT TYPE:  OBV   LOCATION:  3738                         FACILITY:  [facility]   PHYSICIAN:  Andres Shad. Rudean Curt, MD     DATE OF BIRTH:  10/18/1950   DATE OF ADMISSION:  05/24/2006  DATE OF DISCHARGE:  05/31/2006                               DISCHARGE SUMMARY   DISCHARGE DIAGNOSES:  1. Drug fever.  2. Degenerative joint disease.  3. Pancytopenia.   SUMMARY OF HOSPITALIZATION:  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  He  presented to the emergency department on May 23, 2006 complaining of  pain in his left foot and his right shoulder.  He had not had fevers  before admission but began to have fevers shortly after admission to the  hospital.  His CBC revealed pancytopenia which has been a chronic issue  for him.  He was treated empirically with Zosyn and vancomycin and then  because of his pancytopenia his Zosyn was switched in October to unison  plus vancomycin.  A 2D echo was performed on the 29th.  It did not  reveal evidence of endocarditis.  An MRI of his shoulder was suggestive  of osteomyelitis and septic arthritis of the right AC joint.  Orthopedics was consulted at this time who brought him to the operating  room October 20 and irrigated his shoulder.  Their findings were no  evidence of osteomyelitis of the shoulder and only degenerative disease  with clear fluid in the shoulder joint.  The shoulder was irrigated and  mild debridement was performed.  A distal clavicle was excised because  of significant degeneration.  Postoperatively the patient continued to  have temperature elevations as high as 103.  Upon looking at his  temperature curve it seems these fevers were corresponding to antibiotic  doses.  Because his pathology and operative findings  were inconsistent  with osteomyelitis.  It seems likely that his fevers were due to a drug  reaction rather than to an infection.  Thus antibiotics were  discontinued and the patient was discharged in stable condition on  November 2.      Andres Shad. Rudean Curt, MD  Electronically Signed     PML/MEDQ  D:  08/29/2006  T:  08/30/2006  Job:  [id number]

## 2010-12-15 NOTE — Consult Note (Signed)
NAMEJONAH, [name]              ACCOUNT NO.:  [id number]   MEDICAL RECORD NO.:  [id number]          PATIENT TYPE:  OBV   LOCATION:  3738                         FACILITY:  [facility]   PHYSICIAN:  Valentino Hue. 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  The patient has a history of remote right femoral  fracture (1996) with rod placement at that time.  10 years later in 2006  this was complicated by rod fracture with associated MRSA infection  status post rod replacement at [facility] September  2006, now with continuing problems, the proximal fixation screw being  fractured and a lucency surrounding the intramedullary rod suggesting at  least loosening, possibly infection. The fracture site itself continues  to look like it has not healed.  Accordingly the patient is planning  revision of the rod at [facility] under Dr. Adalberto Cole on  December 17.  The patient was seen at [facility] and the lab work was drawn  which according to the admission note showed a hemoglobin of less than  6. Accordingly Dr. Everardo All was contacted to arrange for transfusion and  Dr. Alwyn Ren tonight called for heme consult regarding the continued  problem with anemia.   Other problems included hepatitis C positivity which has been present  also for many years.  The patient was treated with a truncated course of  interferon discontinued because of thrombocytopenia. CT scan on August  2006 showed hepatosplenomegaly.  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  Other problems include depression, anxiety attacks, chronic pain,  cholelithiasis, tonsillectomy and  adenoidectomy, remote trauma to the  left thigh, and degenerative arthritis.   FAMILY HISTORY:  The patient's father died from a drowning accident.  The patient's brother also has hepatitis C.  The patient has a sister  who is alive and well and his mother is still alive.   ALLERGIES:  NO KNOWN DRUG ALLERGIES.   MEDICATIONS:  Current medications on this admission include Paxil,  Xanax, Celebrex, Lortab, Roxicodone.   REVIEW OF SYSTEMS:  This evening the patient says he feels fine.  He  denies shortness of breath, dizziness, palpitations, headaches, new  visual changes (he is blind in the left eye), swallowing difficulties,  cough, phlegm production, pleurisy, hemoptysis, hematemesis, bright red  blood per rectum or melena.  His pain is moderately well controlled on  his current medications.   PHYSICAL EXAMINATION:  VITAL SIGNS:  Vital signs show him to be afebrile  this hospitalization with a temperature of 96.6, pulse 70, respirations  20 and a blood pressure of 87/55, it was 129/76 on admission, room air  saturation is 94%.  I do not palpate any adenopathy.  LUNGS:  Lungs were auscultated with the patient supine showed no  crackles  or wheezes.  HEART:  Regular rate and rhythm.  ABDOMEN:  Positive bowel sounds, soft, and muscular. I was unable to  palpate a clear hepatic edge.  MUSCULOSKELETAL:  Exam shows no ecchymoses over the right thigh.  NEUROLOGIC:  Exam is nonfocal.   LABORATORY DATA:  Shows a normal prothrombin time and the PTT.  His post  transfusion CBC shows a white cell count of 2.2, hemoglobin 8.3, and  platelets 64,000 with no differential. His pre transfusion CBC showed a  white cell count of the 2.9 with the platelet count of 78,000. Recent  labs include a normal B12, normal folate and elevated ferritin at  greater than 2000.   IMPRESSION AND PLAN:  60 year old [place] man with a long history of  pancytopenia which is multifactorial.   1. History of  hepatitis C with chronic active hepatitis in the past,      this in itself attempts to lower the white cell production and      contribute to anemia of chronic illness.  2. Likely infection in the right femoral rod area, even though      multiple blood cultures have been negative in the past 2 months;      again this has the marrow suppressive action of anemia of chronic      inflammation.  3. Splenomegaly artifactually reduces both white cells and platelets.      In most cases the patients actually have adequate number of both      white cells and platelets but there is a traffic jam in the      spleen which reduces the number of circulating white cells and      platelets.  4. There is an element of dilution here as the patient dropped his      white cell count and platelet with transfusion of packed red cells,      of course, that is a transient phenomenon.  5. It is possible that the patient may be having some mild occult      bleeding. Some evidence for that would be if he had an elevated      reticulocyte count, which I doubt but this has not been checked and      it will be sent with him next blood draw.  He does have a history      of diverticular disease and of remote peptic ulcer disease, but      recent guaiacs have been negative as noted.  6. Finally I doubt that there is significant hemolysis but I will      check an LDH and haptoglobin since the patient does have a total      bilirubin of 1.3 which of course could be related to his hepatitis      problem.  I am also incidentally sending a cryoglobulin study and      alpha-fetoprotein to complete the workup.   My recommendation is that consideration be given to the use of Neulasta  and Aranesp. This can easily be done as an outpatient and indeed the  Neulasta can not be given as an inpatient for cost reasons.  The patient  could follow up with Dr. Arline Asp on Tuesday, December 11 and receive those drugs then with the lab  work obtained tomorrow morning would be  available by then. If the patient chooses to stay in the hospital,  however, he will see Dr. Arline Asp on Monday. In any case, my expectation  is that  the patient will require further transfusions both platelets and  red cells just preop and postop.  I do not see much benefit in  chronically transfusing the patient to some arbitrary level but would  rather only transfuse for symptom control, mainly shortness of breath,  palpitations, dizziness and so on as far as the anemia is concerned (the  patient seems to do well with a hemoglobin in the 7-8.5 range from a  symptomatic point of view).   I will make sure Dr. Arline Asp follows the patient either in the hospital  or as an outpatient this coming week.      Valentino Hue. Magrinat, M.D.  Electronically Signed     GCM/MEDQ  D:  07/06/2006  T:  07/07/2006  Job:  [id number]   cc:   Titus Dubin. Alwyn Ren, MD,FACP,FCCP  Samul Dada, M.D.  John L. Rendall, M.D.  Bevelyn Buckles. Bensimhon, MD  Dr. Adalberto Cole, [facility]

## 2011-02-01 ENCOUNTER — Telehealth: Payer: Self-pay | Admitting: *Deleted

## 2011-02-01 NOTE — Telephone Encounter (Signed)
Scheduled 02/02/11 @10 :45am

## 2011-02-01 NOTE — Telephone Encounter (Signed)
Pt advised and transferred to schedule appt.  

## 2011-02-01 NOTE — Telephone Encounter (Signed)
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 During this time Pt found out that Dr Jarold Motto treated him for hepatitis C which was ineffective.  Pt states he started Tx at [facility] on September 12, 2010 and is now in week 28 of 48 and was told to get his lab/bloodwork done at PCP and to have it faxed to [facility] on 02/06/11. Pt states "can he bring this in prior to the 10th of July or just bring in & get bloodwork done on the 10th".?  Tried to call Patient and clarify as to what "this" is that he is inquiring about bringing in. LMOM to this matter.

## 2011-02-01 NOTE — Telephone Encounter (Signed)
Please advise ov.  We'll address then 

## 2011-02-01 NOTE — Telephone Encounter (Signed)
Spoke w/Pt about lab request. He has request paperwork from [facility] to have CBC w/diff done with PCP on 02/06/11. Patient has appointment in our [place] on 02/06/11. Informed Pt to bring in request form from [facility] so that we can make copy of and fax it with the results of his lab work when they are resulted. Please enter order for CBC w/diff per patient request.

## 2011-02-02 ENCOUNTER — Ambulatory Visit: Payer: Self-pay | Admitting: Endocrinology

## 2011-02-06 ENCOUNTER — Other Ambulatory Visit (HOSPITAL_COMMUNITY): Payer: Self-pay | Admitting: Oncology

## 2011-02-06 ENCOUNTER — Encounter (HOSPITAL_BASED_OUTPATIENT_CLINIC_OR_DEPARTMENT_OTHER): Payer: Medicare Other | Admitting: Oncology

## 2011-02-06 DIAGNOSIS — D696 Thrombocytopenia, unspecified: Secondary | ICD-10-CM

## 2011-02-06 DIAGNOSIS — K7460 Unspecified cirrhosis of liver: Secondary | ICD-10-CM

## 2011-02-06 DIAGNOSIS — D649 Anemia, unspecified: Secondary | ICD-10-CM

## 2011-02-06 DIAGNOSIS — B182 Chronic viral hepatitis C: Secondary | ICD-10-CM

## 2011-02-06 LAB — CBC WITH DIFFERENTIAL/PLATELET
Basophils Absolute: 0 10*3/uL (ref 0.0–0.1)
EOS%: 3 % (ref 0.0–7.0)
Eosinophils Absolute: 0.1 10*3/uL (ref 0.0–0.5)
HGB: 8.7 g/dL — ABNORMAL LOW (ref 13.0–17.1)
MCHC: 33.2 g/dL (ref 32.0–36.0)
MCV: 104 fL — ABNORMAL HIGH (ref 79.3–98.0)
MONO#: 0.2 10*3/uL (ref 0.1–0.9)
NEUT%: 45.8 % (ref 39.0–75.0)
nRBC: 0 % (ref 0–0)

## 2011-05-16 ENCOUNTER — Other Ambulatory Visit: Payer: Self-pay | Admitting: Orthopedic Surgery

## 2012-01-29 ENCOUNTER — Encounter: Payer: Medicare Other | Admitting: Endocrinology

## 2012-02-26 ENCOUNTER — Other Ambulatory Visit (INDEPENDENT_AMBULATORY_CARE_PROVIDER_SITE_OTHER): Payer: Medicare Other

## 2012-02-26 ENCOUNTER — Ambulatory Visit (INDEPENDENT_AMBULATORY_CARE_PROVIDER_SITE_OTHER): Payer: Medicare Other | Admitting: Endocrinology

## 2012-02-26 ENCOUNTER — Encounter: Payer: Self-pay | Admitting: Endocrinology

## 2012-02-26 VITALS — BP 132/82 | HR 92 | Temp 97.3°F | Wt 213.0 lb

## 2012-02-26 DIAGNOSIS — E785 Hyperlipidemia, unspecified: Secondary | ICD-10-CM

## 2012-02-26 DIAGNOSIS — Z79899 Other long term (current) drug therapy: Secondary | ICD-10-CM | POA: Insufficient documentation

## 2012-02-26 DIAGNOSIS — Z125 Encounter for screening for malignant neoplasm of prostate: Secondary | ICD-10-CM

## 2012-02-26 DIAGNOSIS — D649 Anemia, unspecified: Secondary | ICD-10-CM

## 2012-02-26 DIAGNOSIS — S78119A Complete traumatic amputation at level between unspecified hip and knee, initial encounter: Secondary | ICD-10-CM

## 2012-02-26 DIAGNOSIS — D61818 Other pancytopenia: Secondary | ICD-10-CM

## 2012-02-26 DIAGNOSIS — B1710 Acute hepatitis C without hepatic coma: Secondary | ICD-10-CM

## 2012-02-26 DIAGNOSIS — Z0000 Encounter for general adult medical examination without abnormal findings: Secondary | ICD-10-CM

## 2012-02-26 DIAGNOSIS — D589 Hereditary hemolytic anemia, unspecified: Secondary | ICD-10-CM | POA: Insufficient documentation

## 2012-02-26 LAB — BASIC METABOLIC PANEL
CO2: 33 mEq/L — ABNORMAL HIGH (ref 19–32)
Chloride: 102 mEq/L (ref 96–112)
Potassium: 4.4 mEq/L (ref 3.5–5.1)

## 2012-02-26 LAB — PSA, MEDICARE: PSA: 0.82 ng/ml (ref 0.10–4.00)

## 2012-02-26 LAB — IBC PANEL
Iron: 310 ug/dL — ABNORMAL HIGH (ref 42–165)
Saturation Ratios: 83.5 % — ABNORMAL HIGH (ref 20.0–50.0)

## 2012-02-26 LAB — LIPID PANEL
Total CHOL/HDL Ratio: 3
VLDL: 78.6 mg/dL — ABNORMAL HIGH (ref 0.0–40.0)

## 2012-02-26 LAB — TSH: TSH: 4.5 u[IU]/mL (ref 0.35–5.50)

## 2012-02-26 NOTE — Progress Notes (Signed)
Subjective:    Patient ID: [name], male    DOB: 1950/08/06, 61 y.o.   MRN: [id number]  HPI The state of at least three ongoing medical problems is addressed today: Dyslipidemia: Denies chest pain Anxiety:  Pt says better recently: he denies sob Hypocalcemia: Denies cramps Past Medical History  Diagnosis Date  . HEPATITIS C 03/24/2007  . ANXIETY 03/24/2007  . CIRRHOSIS 03/24/2007  . HYPERLIPIDEMIA 09/12/2009  . Pancytopenia 12/23/2007  . 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 UROLITHIASIS, HX OF 12/23/2007  . Chronic pain   . 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 FRACTURE, WRIST, LEFT 10/12/2009    Qualifier: Diagnosis of  By: Everardo All MD, Cleophas Dunker   . Hypocalcemia 12/23/2007    Qualifier: Diagnosis of  By: Everardo All MD, Emalyn Schou A     No past surgical history on file.  History   Social History  . Marital Status: Single    Spouse Name: N/A    Number of Children: N/A  . Years of Education: N/A   Occupational History  . Disabled    Social History Main Topics  . Smoking status: Never Smoker   . Smokeless tobacco: Not on file  . Alcohol Use: No  . Drug Use: No  . Sexually Active: Not on file   Other Topics Concern  . Not on file   Social History Narrative   Widowed [phone] alone    Current Outpatient Prescriptions on File Prior to Visit  Medication Sig Dispense Refill  . ALPRAZolam (XANAX) 1 MG tablet Take 1 mg by mouth 3 (three) times daily.        . Multiple Vitamins-Minerals (CENTRUM SILVER PO) Take 1 tablet by mouth daily.        . sildenafil (VIAGRA) 100 MG tablet Take 100 mg by mouth as needed.          Not on File  Family History  Problem Relation Age of Onset  . Cancer Neg Hx     BP 132/82  Pulse 92  Temp 97.3 F (36.3 C) (Oral)  Wt 213 lb (96.616 kg)  SpO2 93%    Review of Systems Denies weight gain and decreased urinary stream    Objective:   Physical  Exam VITAL SIGNS:  See vs page GENERAL: no distress LUNGS:  Clear to auscultation HEART:  Regular rate and rhythm without murmurs noted. Normal S1,S2.       Lab Results  Component Value Date   WBC 1.7* 02/06/2011   HGB 8.7* 02/06/2011   HCT 26.2* 02/06/2011   PLT 24* 02/06/2011   GLUCOSE 133* 02/26/2012   CHOL 121 02/26/2012   TRIG 393.0* 02/26/2012   HDL 45.50 02/26/2012   LDLDIRECT 29.4 02/26/2012   ALT 68* 09/27/2009   AST 66* 09/27/2009   NA 144 02/26/2012   K 4.4 02/26/2012   CL 102 02/26/2012   CREATININE 0.6 02/26/2012   BUN 10 02/26/2012   CO2 33* 02/26/2012   TSH 4.50 02/26/2012   PSA 0.82 02/26/2012      Assessment & Plan:  Hypocalcemia, uncertain etiology, resolved Dyslipidemia.  well-controlled, in view of this non-fasting lab Anxiety, improved   Subjective:   Patient here for Medicare annual wellness visit and management of other chronic and acute problems.     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 Preparing food and eating?: No  Bathing yourself: No  Getting dressed: No  Using the toilet: No  Moving around from place to place: yes.  Needs to take his time.   In the past year have you fallen or had a near fall?: yes    Home Safety: Has smoke detector and wears seat belts. No firearms. No excess sun exposure.  Diet and Exercise  Current exercise habits: limited by med probs Dietary issues discussed: pt reports a healthy diet   Depression Screen  Q1: Over the past two weeks, have you felt down, depressed or hopeless? no  Q2: Over the past two weeks, have you felt little interest or pleasure in doing things? No, but he has anxiety.   The following portions of the patient's history were reviewed and updated as  appropriate: allergies, current medications, past family history, past medical history, past social history, past surgical history and problem list.  Past Medical History  Diagnosis Date  . HEPATITIS C 03/24/2007  . ANXIETY 03/24/2007  . CIRRHOSIS 03/24/2007  . HYPERLIPIDEMIA 09/12/2009  . Pancytopenia 12/23/2007  . 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 UROLITHIASIS, HX OF 12/23/2007  . Chronic pain   . 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 FRACTURE, WRIST, LEFT 10/12/2009    Qualifier: Diagnosis of  By: Everardo All MD, Cleophas Dunker   . Hypocalcemia 12/23/2007    Qualifier: Diagnosis of  By: Everardo All MD, Bilan Tedesco A     No past surgical history on file.  History   Social History  . Marital Status: Single    Spouse Name: N/A    Number of Children: N/A  . Years of Education: N/A   Occupational History  . Disabled    Social History Main Topics  . Smoking status: Never Smoker   . Smokeless tobacco: Not on file  . Alcohol Use: No  . Drug Use: No  . Sexually Active: Not on file   Other Topics Concern  . Not on file   Social History Narrative   Widowed [phone] alone    Current Outpatient Prescriptions on File Prior to Visit  Medication Sig Dispense Refill  . ALPRAZolam (XANAX) 1 MG tablet Take 1 mg by mouth 3 (three) times daily.        . Multiple Vitamins-Minerals (CENTRUM SILVER PO) Take 1 tablet by mouth daily.        . sildenafil (VIAGRA) 100 MG tablet Take 100 mg by mouth as needed.          Not on File  Family History  Problem Relation Age of Onset  . Cancer Neg Hx     BP 132/82  Pulse 92  Temp 97.3 F (36.3 C) (Oral)  Wt 213 lb (96.616 kg)  SpO2 93%   Review of Systems  Denies hearing loss, and visual loss Objective:   Vision:  Sees opthalmologist Hearing: grossly normal Body mass index:  See vs page Msk: pt slowly performs "get-up-and-go" from a sitting position, with the aid of  crutches.   Cognitive Impairment Assessment: cognition, memory and judgment appear normal.   remembers 3/3 at 5 minutes.  excellent recall.  can easily read and write a sentence.  alert and  oriented x 3   Assessment:   Medicare wellness utd on preventive parameters.     Plan:   During the course of the visit the patient was educated and counseled about appropriate screening and preventive services including:        Fall prevention  Diabetes screening  Nutrition counseling   Vaccines / LABS Zostavax / Pnemonccoal Vaccine  today  PSA  Patient Instructions (the written plan) was given to the patient.

## 2012-02-26 NOTE — Patient Instructions (Addendum)
please consider these measures for your health:  minimize alcohol.  do not use tobacco products.  have a colonoscopy at least every 10 years from age 61.  keep firearms safely stored.  always use seat belts.  have working smoke alarms in your home.  see an eye doctor and dentist regularly.  never drive under the influence of alcohol or drugs (including prescription drugs).  those with fair skin should take precautions against the sun. please let me know what your wishes would be, if artificial life support measures should become necessary.  it is critically important to prevent falling down (keep floor areas well-lit, dry, and free of loose objects.  If you have a cane, walker, or wheelchair, you should use it, even for short trips around the house.  Also, try not to rush) good diet and exercise habits significanly improve the control of your health.   blood tests are being requested for you today.  You will receive a letter with results. Please return in 1 year.

## 2012-02-27 ENCOUNTER — Telehealth: Payer: Self-pay | Admitting: *Deleted

## 2012-02-27 LAB — PTH, INTACT AND CALCIUM
Calcium, Total (PTH): 9.8 mg/dL (ref 8.4–10.5)
PTH: 40.9 pg/mL (ref 14.0–72.0)

## 2012-02-27 NOTE — Telephone Encounter (Signed)
Called pt to inform of lab results, pt informed via VM and to callback office with any questions/concerns. (Letter also mailed to pt) 

## 2012-05-21 ENCOUNTER — Encounter: Payer: Self-pay | Admitting: Endocrinology

## 2012-05-21 ENCOUNTER — Ambulatory Visit (INDEPENDENT_AMBULATORY_CARE_PROVIDER_SITE_OTHER): Payer: Medicare Other | Admitting: Endocrinology

## 2012-05-21 VITALS — BP 130/82 | HR 75 | Temp 97.6°F | Resp 16 | Wt 212.3 lb

## 2012-05-21 DIAGNOSIS — L02419 Cutaneous abscess of limb, unspecified: Secondary | ICD-10-CM

## 2012-05-21 DIAGNOSIS — Z23 Encounter for immunization: Secondary | ICD-10-CM

## 2012-05-21 DIAGNOSIS — L03115 Cellulitis of right lower limb: Secondary | ICD-10-CM

## 2012-05-21 DIAGNOSIS — L03119 Cellulitis of unspecified part of limb: Secondary | ICD-10-CM

## 2012-05-21 MED ORDER — DOXYCYCLINE HYCLATE 100 MG PO TABS: 100.0000 mg | ORAL_TABLET | Freq: Two times a day (BID) | ORAL | Status: DC

## 2012-05-21 NOTE — Patient Instructions (Addendum)
i have sent a prescription to your pharmacy, for an antibiotic pill. I hope you feel better soon.  If you don't feel better by next week, please call back.  Please go to the ER sooner if you get worse.

## 2012-05-21 NOTE — Progress Notes (Signed)
  Subjective:    Patient ID: [name], male    DOB: 08/23/1950, 61 y.o.   MRN: [id number]  HPI 1 week ago, pt stumbled and fell at home.  Since then, he has moderate pain at the right AKA stump, and assoc redness.  He says he has h/o mrsa there.   Past Medical History  Diagnosis Date  . HEPATITIS C 03/24/2007  . ANXIETY 03/24/2007  . CIRRHOSIS 03/24/2007  . HYPERLIPIDEMIA 09/12/2009  . Pancytopenia 12/23/2007  . 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 UROLITHIASIS, HX OF 12/23/2007  . Chronic pain   . 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 FRACTURE, WRIST, LEFT 10/12/2009    Qualifier: Diagnosis of  By: Everardo All MD, Cleophas Dunker   . Hypocalcemia 12/23/2007    Qualifier: Diagnosis of  By: Everardo All MD, Leroy Pettway A     No past surgical history on file.  History   Social History  . Marital Status: Single    Spouse Name: N/A    Number of Children: N/A  . Years of Education: N/A   Occupational History  . Disabled    Social History Main Topics  . Smoking status: Never Smoker   . Smokeless tobacco: Not on file  . Alcohol Use: No  . Drug Use: No  . Sexually Active: Not on file   Other Topics Concern  . Not on file   Social History Narrative   Widowed [phone] alone    Current Outpatient Prescriptions on File Prior to Visit  Medication Sig Dispense Refill  . ALPRAZolam (XANAX) 1 MG tablet Take 1 mg by mouth 3 (three) times daily.        Marland Kitchen aspirin 81 MG tablet Take 81 mg by mouth daily.      Marland Kitchen MILK THISTLE PO Take 1 capsule by mouth daily.      . Multiple Vitamins-Minerals (CENTRUM SILVER PO) Take 1 tablet by mouth daily.        Marland Kitchen oxycodone (ROXICODONE) 30 MG immediate release tablet Take 30 mg by mouth daily.       . sildenafil (VIAGRA) 100 MG tablet Take 100 mg by mouth as needed.         No current facility-administered medications on file prior to visit.    No Known Allergies  Family History   Problem Relation Age of Onset  . Cancer Neg Hx    BP 130/82  Pulse 75  Temp 97.6 F (36.4 C) (Oral)  Resp 16  Wt 212 lb 5 oz (96.304 kg)  SpO2 97%  Review of Systems Denies fever.    Objective:   Physical Exam VITAL SIGNS:  See vs page GENERAL: no distress Right AKA stump: 7 cm area of erythema and tenderness.  No break in the skin.     Assessment & Plan:  Cellulitis, new

## 2012-05-22 ENCOUNTER — Encounter (HOSPITAL_COMMUNITY): Payer: Self-pay | Admitting: Emergency Medicine

## 2012-05-22 ENCOUNTER — Emergency Department (INDEPENDENT_AMBULATORY_CARE_PROVIDER_SITE_OTHER)
Admission: EM | Admit: 2012-05-22 | Discharge: 2012-05-22 | Disposition: A | Discharge status: Home / Self Care | Payer: Medicare Other | Source: Home / Self Care | Attending: Emergency Medicine | Admitting: Emergency Medicine

## 2012-05-22 DIAGNOSIS — L0390 Cellulitis, unspecified: Secondary | ICD-10-CM

## 2012-05-22 DIAGNOSIS — L0291 Cutaneous abscess, unspecified: Secondary | ICD-10-CM

## 2012-05-22 MED ORDER — CEFTRIAXONE SODIUM 1 G IJ SOLR
INTRAMUSCULAR | Status: AC
  Filled 2012-05-22: qty 10

## 2012-05-22 MED ORDER — DOXYCYCLINE HYCLATE 100 MG PO TABS: 100.0000 mg | ORAL_TABLET | Freq: Two times a day (BID) | ORAL | Status: DC

## 2012-05-22 MED ORDER — CEFTRIAXONE SODIUM 1 G IJ SOLR
1.0000 g | Freq: Once | INTRAMUSCULAR | Status: AC
  Administered 2012-05-22: 1 g via INTRAMUSCULAR

## 2012-05-22 MED ORDER — LIDOCAINE HCL (PF) 1 % IJ SOLN
INTRAMUSCULAR | Status: AC
  Filled 2012-05-22: qty 5

## 2012-05-22 MED ORDER — MUPIROCIN 2 % EX OINT: TOPICAL_OINTMENT | CUTANEOUS | Status: DC

## 2012-05-22 NOTE — ED Notes (Signed)
Pt c/o injury to stump of right leg. Pt states that his dog got tangled up with his hover round and he was trying to get it loose and fell on the pavement hitting right stump of leg.  Pt states that it is red and has puss, some swelling.   Incident happened a week ago.

## 2012-05-22 NOTE — ED Provider Notes (Addendum)
Chief Complaint  Patient presents with  . Fall    History of Present Illness:    The patient is a 61 year old male who has a right above-the-knee amputation. He struck the stump on something about a week ago and ever since then and has been pain and swelling in that area. He has a history of MRSA in the past and is concerned that this could come back again. He denies any drainage, fever, or chills. He also has a history of hepatitis C.  Review of Systems:  Other than noted above, the patient denies any of the following symptoms: Systemic:  No fever, chills or sweats. Skin:  No rash or itching.  PMFSH:  Past medical history, family history, social history, meds, and allergies were reviewed.  No history of diabetes or prior history of abscesses or MRSA.  Physical Exam:   Vital signs:  BP 154/102  Pulse 45  Temp 99.1 F (37.3 C) (Oral)  SpO2 98% Skin:  The tip of the stump was are currently erythematous and area measuring 5 x 5 cm with central fluctuance but no drainage.  Skin exam was otherwise normal.  No rash. Ext:  Distal pulses were full, patient has full ROM of all joints.    Procedure:  Verbal informed consent was obtained.  The patient was informed of the risks and benefits of the procedure and understands and accepts.  Identity of the patient was verified verbally and by wristband.   The abscess area described above was prepped with Betadine and alcohol and anesthetized with 3 mL of 2% Xylocaine with epinephrine.  Using a #11 scalpel blade, a singe straight incision was made into the area of fluctulence, yielding a large amount of prurulent drainage.  Routine cultures were obtained.  Blunt dissection was used to break up loculations and the resulting wound cavity was packed with 1/4 inch Iodoform gauze.  A sterile pressure dressing was applied.  Course in Urgent Care Center:   He was given Rocephin 1 g IM and tolerated this well without any immediate side effects.  Assessment:   The encounter diagnosis was Abscess.  Plan:   1.  The following meds were prescribed:   New Prescriptions   DOXYCYCLINE (VIBRA-TABS) 100 MG TABLET    Take 1 tablet (100 mg total) by mouth 2 (two) times daily.   MUPIROCIN OINTMENT (BACTROBAN) 2 %    Apply a small amount to both nostrils BID for 1 month.   2.  The patient was instructed in symptomatic care and handouts were given. 3.  The patient was instructed to leave the dressing in place and return again in 48 hours for packing removal.   Reuben Likes, MD 05/22/12 2146  Reuben Likes, MD 05/22/12 2147

## 2012-05-24 ENCOUNTER — Emergency Department (INDEPENDENT_AMBULATORY_CARE_PROVIDER_SITE_OTHER)
Admission: EM | Admit: 2012-05-24 | Discharge: 2012-05-24 | Disposition: A | Discharge status: Home / Self Care | Payer: Medicare Other | Source: Home / Self Care | Attending: Emergency Medicine | Admitting: Emergency Medicine

## 2012-05-24 ENCOUNTER — Encounter (HOSPITAL_COMMUNITY): Payer: Self-pay | Admitting: Emergency Medicine

## 2012-05-24 DIAGNOSIS — Z4800 Encounter for change or removal of nonsurgical wound dressing: Secondary | ICD-10-CM

## 2012-05-24 DIAGNOSIS — L02419 Cutaneous abscess of limb, unspecified: Secondary | ICD-10-CM

## 2012-05-24 NOTE — ED Provider Notes (Signed)
History     CSN: [id number]  Arrival date & time 05/24/12  4098   First MD Initiated Contact with Patient 05/24/12 [phone]      Chief Complaint  Patient presents with  . Suture / Staple Removal    pt is here for packing removal    (Consider location/radiation/quality/duration/timing/severity/associated sxs/prior treatment) HPI Comments: Patient presents urgent care this morning to have his wound rechecked and to have the packing removed. He had an I&D of the abscess located on his right amputated lower extremity. Patient describes that the tenderness is much better as he has no pain now and the redness area looks somewhat better and smaller. Some soreness in the area, and denies any drainage. Patient also denies any fevers, chills or constitutional symptoms such as myalgias arthralgias or chills or changes in appetite. Very concerned and wants this not to be MRSA as he had a long course of antibiotic treatment on a previous location  The history is provided by the patient.    Past Medical History  Diagnosis Date  . HEPATITIS C 03/24/2007  . ANXIETY 03/24/2007  . CIRRHOSIS 03/24/2007  . HYPERLIPIDEMIA 09/12/2009  . Pancytopenia 12/23/2007  . 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 UROLITHIASIS, HX OF 12/23/2007  . Chronic pain   . 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 FRACTURE, WRIST, LEFT 10/12/2009    Qualifier: Diagnosis of  By: Everardo All MD, Cleophas Dunker   . Hypocalcemia 12/23/2007    Qualifier: Diagnosis of  By: Everardo All MD, Gregary Signs A     History reviewed. No pertinent past surgical history.  Family History  Problem Relation Age of Onset  . Cancer Neg Hx     History  Substance Use Topics  . Smoking status: Never Smoker   . Smokeless tobacco: Not on file  . Alcohol Use: No      Review of Systems  Constitutional: Negative for fever and chills.  Skin: Positive for wound.    Allergies  Review of  patient's allergies indicates no known allergies.  Home Medications   Current Outpatient Rx  Name Route Sig Dispense Refill  . ALPRAZOLAM 1 MG PO TABS Oral Take 1 mg by mouth 3 (three) times daily.      . ASPIRIN 81 MG PO TABS Oral Take 81 mg by mouth daily.    Marland Kitchen DOXYCYCLINE HYCLATE 100 MG PO TABS Oral Take 1 tablet (100 mg total) by mouth 2 (two) times daily. 14 tablet 0  . DOXYCYCLINE HYCLATE 100 MG PO TABS Oral Take 1 tablet (100 mg total) by mouth 2 (two) times daily. 20 tablet 0  . MILK THISTLE PO Oral Take 1 capsule by mouth daily.    . CENTRUM SILVER PO Oral Take 1 tablet by mouth daily.      Marland Kitchen MUPIROCIN 2 % EX OINT  Apply a small amount to both nostrils BID for 1 month. 22 g 1  . OXYCODONE HCL 30 MG PO TABS Oral Take 30 mg by mouth daily.     Marland Kitchen SILDENAFIL CITRATE 100 MG PO TABS Oral Take 100 mg by mouth as needed.        BP 145/84  Pulse 71  Temp 98.3 F (36.8 C) (Oral)  Resp 18  SpO2 100%  Physical Exam  Nursing note and vitals reviewed. Constitutional: No distress.  Musculoskeletal: He exhibits tenderness.  Legs:   ED Course  Procedures (including critical care time)  Labs Reviewed - No data to display No results found.   1. Abscess of thigh       MDM   Status post I+D 48 hours hours. Patient taking doxycycline. Preliminary culture results reveal staph aureus no antibiotic sensitivity available. Patient describes area seems to be less tender and less swollen and he feels is doing better. Patient was encouraged to return in 2 days for recheck and advised that he will be contacted if any changes will be needed based on for culture results. Patient agrees with followup care and will continue taking antibiotics and daily dressing changes.       Jimmie Molly, MD 05/24/12 1034

## 2012-05-24 NOTE — ED Notes (Signed)
Pt is his here for packing removal from right leg stump.  Pt states that he is having some soreness and more redness but denies pain.  Pt denies any odor or unusual drainage.

## 2012-05-25 LAB — CULTURE, ROUTINE-ABSCESS

## 2012-05-27 NOTE — ED Notes (Signed)
Abscess culture: rare Staph. species (coagulase neg.)- no sensitivity.  Pt. treated with Doxycycline.  Discussed with Dr. Lorenz Coaster and he said tx. was  adequate. No further action needed.

## 2012-05-28 ENCOUNTER — Encounter (HOSPITAL_COMMUNITY): Payer: Self-pay

## 2012-05-28 ENCOUNTER — Emergency Department (HOSPITAL_COMMUNITY)
Admission: EM | Admit: 2012-05-28 | Discharge: 2012-05-28 | Disposition: A | Discharge status: Home / Self Care | Payer: Medicare Other | Source: Home / Self Care

## 2012-05-28 DIAGNOSIS — L0291 Cutaneous abscess, unspecified: Secondary | ICD-10-CM

## 2012-05-28 MED ORDER — MUPIROCIN CALCIUM 2 % EX CREA: TOPICAL_CREAM | Freq: Three times a day (TID) | CUTANEOUS | Status: DC

## 2012-05-28 NOTE — ED Provider Notes (Signed)
History     CSN: [id number]  Arrival date & time 05/28/12  1054   None     Chief Complaint  Patient presents with  . Skin Ulcer    (Consider location/radiation/quality/duration/timing/severity/associated sxs/prior treatment) Patient is a 61 y.o. male presenting with abscess. The history is provided by the patient. No language interpreter was used.  Abscess  This is a new problem. The current episode started today. The problem occurs continuously. The problem has been gradually worsening. The abscess is present on the right upper leg. The problem is mild. The abscess is characterized by redness.  Pt has above the knee amputation.  Pt had I and D to abscess on stump.   Pt reports area looks better,  Decreased redness, decreased swelling,  No fever,  Past Medical History  Diagnosis Date  . HEPATITIS C 03/24/2007  . ANXIETY 03/24/2007  . CIRRHOSIS 03/24/2007  . HYPERLIPIDEMIA 09/12/2009  . Pancytopenia 12/23/2007  . 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 UROLITHIASIS, HX OF 12/23/2007  . Chronic pain   . 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 FRACTURE, WRIST, LEFT 10/12/2009    Qualifier: Diagnosis of  By: Everardo All MD, Cleophas Dunker   . Hypocalcemia 12/23/2007    Qualifier: Diagnosis of  By: Everardo All MD, Gregary Signs A     History reviewed. No pertinent past surgical history.  Family History  Problem Relation Age of Onset  . Cancer Neg Hx     History  Substance Use Topics  . Smoking status: Never Smoker   . Smokeless tobacco: Not on file  . Alcohol Use: No      Review of Systems  Skin: Positive for wound.  All other systems reviewed and are negative.    Allergies  Review of patient's allergies indicates no known allergies.  Home Medications   Current Outpatient Rx  Name Route Sig Dispense Refill  . ALPRAZOLAM 1 MG PO TABS Oral Take 1 mg by mouth 3 (three) times daily.      . ASPIRIN 81 MG PO  TABS Oral Take 81 mg by mouth daily.    Marland Kitchen DOXYCYCLINE HYCLATE 100 MG PO TABS Oral Take 1 tablet (100 mg total) by mouth 2 (two) times daily. 14 tablet 0  . DOXYCYCLINE HYCLATE 100 MG PO TABS Oral Take 1 tablet (100 mg total) by mouth 2 (two) times daily. 20 tablet 0  . MILK THISTLE PO Oral Take 1 capsule by mouth daily.    . CENTRUM SILVER PO Oral Take 1 tablet by mouth daily.      Marland Kitchen MUPIROCIN 2 % EX OINT  Apply a small amount to both nostrils BID for 1 month. 22 g 1  . OXYCODONE HCL 30 MG PO TABS Oral Take 30 mg by mouth daily.     Marland Kitchen SILDENAFIL CITRATE 100 MG PO TABS Oral Take 100 mg by mouth as needed.        BP 168/75  Pulse 70  Temp 98.1 F (36.7 C) (Oral)  Resp 22  SpO2 98%  Physical Exam  Vitals reviewed. Constitutional: He is oriented to person, place, and time. He appears well-developed and well-nourished.  Musculoskeletal: Normal range of motion. He exhibits tenderness.  Neurological: He is alert and oriented to person, place, and time. He has normal reflexes.  Skin: There is erythema.       Healing incision site,  Decrease erythema  from mark circled area  Psychiatric: He has a normal mood and affect.    ED Course  Procedures (including critical care time)  Labs Reviewed - No data to display No results found.   1. Abscess       MDM  Continue antibiotics.   Keep area clean,  rx for bactroban to use if any further skin infections        Elson Areas, [place] 05/28/12 1229  Lonia Skinner [place], [place] 05/28/12 1250

## 2012-05-28 NOTE — ED Notes (Signed)
Report on record of gram pos cocci, rare staph ; here for wound check

## 2012-05-28 NOTE — ED Notes (Signed)
Multiple social issues w son; given printed information regarding [facility] for him and substance abuse treatment , to help w his anxiety about his on. Pt will contact his MD regarding his BP maintainance

## 2012-05-29 NOTE — ED Provider Notes (Signed)
Medical screening examination/treatment/procedure(s) were performed by non-physician practitioner and as supervising physician I was immediately available for consultation/collaboration.   MORENO-COLL,Christyna Letendre; MD   Tomekia Helton Moreno-Coll, MD 05/29/12 0621 

## 2012-06-10 ENCOUNTER — Other Ambulatory Visit: Payer: Self-pay | Admitting: Endocrinology

## 2012-06-11 NOTE — Telephone Encounter (Signed)
Please advise ov 

## 2012-06-11 NOTE — Telephone Encounter (Signed)
aapt 11/14

## 2012-06-11 NOTE — Telephone Encounter (Signed)
Pt advised he needs to come in ofr an ov appt. Scheduled for 06/12/12 at 8:45

## 2012-06-11 NOTE — Telephone Encounter (Signed)
Pt advised he needs to come in for an ov 

## 2012-06-12 ENCOUNTER — Ambulatory Visit (INDEPENDENT_AMBULATORY_CARE_PROVIDER_SITE_OTHER): Payer: Medicare Other | Admitting: Endocrinology

## 2012-06-12 ENCOUNTER — Encounter: Payer: Self-pay | Admitting: Endocrinology

## 2012-06-12 VITALS — BP 126/80 | HR 68 | Temp 97.5°F

## 2012-06-12 DIAGNOSIS — L03115 Cellulitis of right lower limb: Secondary | ICD-10-CM

## 2012-06-12 DIAGNOSIS — L02419 Cutaneous abscess of limb, unspecified: Secondary | ICD-10-CM

## 2012-06-12 MED ORDER — DOXYCYCLINE HYCLATE 100 MG PO TABS: 100.0000 mg | ORAL_TABLET | Freq: Two times a day (BID) | ORAL | Status: DC

## 2012-06-12 NOTE — Progress Notes (Signed)
  Subjective:    Patient ID: [name]., male    DOB: 16-Feb-1951, 61 y.o.   MRN: [id number]  HPI Pt was seen here 3 weeks ago with cellulitis of the left AKA stump.  He was seen in ER the next day, where he underwent I and D.  He says the erythema is improved, but not resolved.    Past Medical History  Diagnosis Date  . HEPATITIS C 03/24/2007  . ANXIETY 03/24/2007  . CIRRHOSIS 03/24/2007  . HYPERLIPIDEMIA 09/12/2009  . Pancytopenia 12/23/2007  . 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 UROLITHIASIS, HX OF 12/23/2007  . Chronic pain   . 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 FRACTURE, WRIST, LEFT 10/12/2009    Qualifier: Diagnosis of  By: Everardo All MD, Cleophas Dunker   . Hypocalcemia 12/23/2007    Qualifier: Diagnosis of  By: Everardo All MD, Shady Padron A     No past surgical history on file.  History   Social History  . Marital Status: Single    Spouse Name: N/A    Number of Children: N/A  . Years of Education: N/A   Occupational History  . Disabled    Social History Main Topics  . Smoking status: Never Smoker   . Smokeless tobacco: Not on file  . Alcohol Use: No  . Drug Use: No  . Sexually Active: Not on file   Other Topics Concern  . Not on file   Social History Narrative   Widowed [phone] alone    Current Outpatient Prescriptions on File Prior to Visit  Medication Sig Dispense Refill  . ALPRAZolam (XANAX) 1 MG tablet Take 1 mg by mouth 3 (three) times daily.        Marland Kitchen aspirin 81 MG tablet Take 81 mg by mouth daily.      Marland Kitchen doxycycline (VIBRA-TABS) 100 MG tablet Take 1 tablet (100 mg total) by mouth 2 (two) times daily.  14 tablet  0  . MILK THISTLE PO Take 1 capsule by mouth daily.      . Multiple Vitamins-Minerals (CENTRUM SILVER PO) Take 1 tablet by mouth daily.        . mupirocin cream (BACTROBAN) 2 % Apply topically 3 (three) times daily.  15 g  0  . mupirocin ointment (BACTROBAN) 2 % Apply  a small amount to both nostrils BID for 1 month.  22 g  1  . oxycodone (ROXICODONE) 30 MG immediate release tablet Take 30 mg by mouth daily.       . sildenafil (VIAGRA) 100 MG tablet Take 100 mg by mouth as needed.          No Known Allergies  Family History  Problem Relation Age of Onset  . Cancer Neg Hx    BP 126/80  Pulse 68  Temp 97.5 F (36.4 C) (Oral)  SpO2 98%  Review of Systems Denies fever.    Objective:   Physical Exam VITAL SIGNS:  See vs page GENERAL: no distress Right AKA stump: Slight erythema, with slight serous drainage.   (i reviewed culture result)    Assessment & Plan:  Cellulitis, improved, but not resolved

## 2012-06-12 NOTE — Patient Instructions (Addendum)
i have sent a prescription to your pharmacy, to refill the antibiotic.  Refer to an orthopedic specialist.  you will receive a phone call, about a day and time for an appointment.

## 2012-06-16 ENCOUNTER — Telehealth: Payer: Self-pay | Admitting: *Deleted

## 2012-06-16 NOTE — Telephone Encounter (Signed)
PATIENT CALLED STATES HE HAS NOT HEARD FROM ANYONE CONCERNING REFERRAL TO ORTHOPEDIC DOCTOR LIKE DR. ELLISON SAID WAS TO DO. PATIENT GIVEN P[facility], MARY , # TO CALL TO CHECK ON THIS.

## 2012-06-17 ENCOUNTER — Telehealth: Payer: Self-pay | Admitting: Endocrinology

## 2012-06-17 NOTE — Telephone Encounter (Signed)
Please ref this message to p[facility]

## 2012-06-17 NOTE — Telephone Encounter (Signed)
Pt called regarding his leg amputation. Still having a lot of trouble. Dr. Everardo All referred him to Dr. Eulah Pont, who would not see him. He is also having trouble getting in with [place]. He spoke to his Careers adviser at [facility], who has agreed to fit him in but they need the records from this office as well as his ER visits sent to them. Attn: Dr. Archie Endo. Fax # [phone].

## 2013-01-26 ENCOUNTER — Telehealth: Payer: Self-pay

## 2013-01-26 DIAGNOSIS — S78119A Complete traumatic amputation at level between unspecified hip and knee, initial encounter: Secondary | ICD-10-CM

## 2013-01-26 NOTE — Telephone Encounter (Signed)
How do I order this?

## 2013-01-26 NOTE — Telephone Encounter (Signed)
Pt states he needs an order for motorized wheelchair, please fax to [facility] Fax # [phone]

## 2013-01-26 NOTE — Telephone Encounter (Signed)
Done

## 2013-01-27 NOTE — Telephone Encounter (Signed)
Order faxed.

## 2013-02-17 ENCOUNTER — Telehealth: Payer: Self-pay | Admitting: *Deleted

## 2013-02-17 NOTE — Telephone Encounter (Signed)
Ann from [employer] left vm inquiring about form that was faxed in for power wheel chair repair. Forms were originally faxed over 02/09/13. Order #[id number] Dewayne Hatch would like a call back with the status on forms. If any questions call [phone] ext: [phone].

## 2013-02-19 ENCOUNTER — Telehealth: Payer: Self-pay | Admitting: *Deleted

## 2013-02-19 NOTE — Telephone Encounter (Signed)
Patient is requesting a new "handicap sticker".  Please call when ready to pick up

## 2013-02-19 NOTE — Telephone Encounter (Signed)
Pt called to request his handicap Placard, he would like to pick it up tomorrow morning to take to the [facility].

## 2013-02-20 NOTE — Telephone Encounter (Signed)
done

## 2013-11-26 ENCOUNTER — Ambulatory Visit (INDEPENDENT_AMBULATORY_CARE_PROVIDER_SITE_OTHER): Payer: Medicare Other | Admitting: Endocrinology

## 2013-11-26 ENCOUNTER — Encounter: Payer: Self-pay | Admitting: Endocrinology

## 2013-11-26 ENCOUNTER — Telehealth: Payer: Self-pay | Admitting: Endocrinology

## 2013-11-26 VITALS — BP 130/80 | HR 112 | Temp 98.2°F | Ht 72.0 in | Wt 209.0 lb

## 2013-11-26 DIAGNOSIS — D649 Anemia, unspecified: Secondary | ICD-10-CM

## 2013-11-26 DIAGNOSIS — Z125 Encounter for screening for malignant neoplasm of prostate: Secondary | ICD-10-CM

## 2013-11-26 DIAGNOSIS — Z0000 Encounter for general adult medical examination without abnormal findings: Secondary | ICD-10-CM

## 2013-11-26 DIAGNOSIS — B1710 Acute hepatitis C without hepatic coma: Secondary | ICD-10-CM

## 2013-11-26 DIAGNOSIS — D61818 Other pancytopenia: Secondary | ICD-10-CM

## 2013-11-26 DIAGNOSIS — G547 Phantom limb syndrome without pain: Secondary | ICD-10-CM

## 2013-11-26 DIAGNOSIS — E785 Hyperlipidemia, unspecified: Secondary | ICD-10-CM

## 2013-11-26 LAB — BASIC METABOLIC PANEL
BUN: 12 mg/dL (ref 6–23)
CALCIUM: 9.7 mg/dL (ref 8.4–10.5)
CHLORIDE: 103 meq/L (ref 96–112)
CO2: 31 meq/L (ref 19–32)
CREATININE: 0.6 mg/dL (ref 0.4–1.5)
GFR: 146.8 mL/min (ref >60.00)
Glucose, Bld: 104 mg/dL — ABNORMAL HIGH (ref 70–99)
Potassium: 3.9 mEq/L (ref 3.5–5.1)
Sodium: 141 mEq/L (ref 135–145)

## 2013-11-26 LAB — CBC WITH DIFFERENTIAL/PLATELET
BASOS ABS: 0 10*3/uL (ref 0.0–0.1)
Basophils Relative: 0.5 % (ref 0.0–3.0)
EOS ABS: 0.1 10*3/uL (ref 0.0–0.7)
Eosinophils Relative: 2.5 % (ref 0.0–5.0)
HCT: 32.7 % — ABNORMAL LOW (ref 39.0–52.0)
Hemoglobin: 11.3 g/dL — ABNORMAL LOW (ref 13.0–17.0)
LYMPHS PCT: 17.7 % (ref 12.0–46.0)
Lymphs Abs: 0.5 10*3/uL — ABNORMAL LOW (ref 0.7–4.0)
MCHC: 34.5 g/dL (ref 30.0–36.0)
MCV: 101.1 fl — AB (ref 78.0–100.0)
MONO ABS: 0.3 10*3/uL (ref 0.1–1.0)
Monocytes Relative: 9.7 % (ref 3.0–12.0)
NEUTROS PCT: 69.6 % (ref 43.0–77.0)
Neutro Abs: 1.9 10*3/uL (ref 1.4–7.7)
RBC: 3.23 Mil/uL — ABNORMAL LOW (ref 4.22–5.81)
RDW: 18.6 % — ABNORMAL HIGH (ref 11.5–14.6)
WBC: 2.7 10*3/uL — ABNORMAL LOW (ref 4.5–10.5)

## 2013-11-26 LAB — IBC PANEL
IRON: 229 ug/dL — AB (ref 42–165)
Saturation Ratios: 66.1 % — ABNORMAL HIGH (ref 20.0–50.0)
TRANSFERRIN: 247.4 mg/dL (ref 212.0–360.0)

## 2013-11-26 LAB — PSA, MEDICARE: PSA: 0.63 ng/mL (ref 0.10–4.00)

## 2013-11-26 LAB — LIPID PANEL
Cholesterol: 104 mg/dL (ref 0–200)
HDL: 26.7 mg/dL — AB (ref >39.00)
Total CHOL/HDL Ratio: 4
VLDL: 87.6 mg/dL — AB (ref 0.0–40.0)

## 2013-11-26 LAB — TSH: TSH: 7.52 u[IU]/mL — ABNORMAL HIGH (ref 0.35–5.50)

## 2013-11-26 MED ORDER — LEVOTHYROXINE SODIUM 50 MCG PO TABS: 50.0000 ug | ORAL_TABLET | Freq: Every day | ORAL | Status: DC

## 2013-11-26 NOTE — Telephone Encounter (Signed)
call [employer] with order for them to fix his chair fax # [phone] phone # [phone]

## 2013-11-26 NOTE — Patient Instructions (Addendum)
good diet and exercise habits significanly improve the control of your health.  please let me know if you wish to be referred to a dietician.  high blood sugar is very risky to your health.  you should see an eye doctor every year.  You are at higher than average risk for pneumonia and hepatitis-B.  You should be vaccinated against both.   blood tests are being requested for you today.  We'll contact you with results. please consider these measures for your health:  minimize alcohol.  do not use tobacco products.  have a colonoscopy at least every 10 years from age 67.  keep firearms safely stored.  always use seat belts.  have working smoke alarms in your home.  see an eye doctor and dentist regularly.  never drive under the influence of alcohol or drugs (including prescription drugs).  those with fair skin should take precautions against the sun. please let me know what your wishes would be, if artificial life support measures should become necessary.  it is critically important to prevent falling down (keep floor areas well-lit, dry, and free of loose objects.  If you have a cane, walker, or wheelchair, you should use it, even for short trips around the house.  Also, try not to rush). You should have a vaccine against shingles (a painful rash which results from the  chickenpox infection which most people had many years ago).  This vaccine reduces, but does not totally eliminate the risk of shingles.  Because this is a medicare part d benefit, you should get it at a pharmacy.   Please return in 1 year.

## 2013-11-26 NOTE — Progress Notes (Signed)
Subjective:    Patient ID: Derek Rough., male    DOB: [birth year], 63 y.o.   MRN: [id number]  HPI The state of at least three ongoing medical problems is addressed today, with interval history of each noted here: Anemia: he denies BRBPR Dyslipidemia: he denies chest pain. Leukopenia/pancytopenia: he denies fever Past Medical History  Diagnosis Date  . HEPATITIS C 03/24/2007  . ANXIETY 03/24/2007  . CIRRHOSIS 03/24/2007  . HYPERLIPIDEMIA 09/12/2009  . Pancytopenia 12/23/2007  . 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 UROLITHIASIS, HX OF 12/23/2007  . Chronic pain   . 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 FRACTURE, WRIST, LEFT 10/12/2009    Qualifier: Diagnosis of  By: Loanne Drilling MD, Jacelyn Pi   . Hypocalcemia 12/23/2007    Qualifier: Diagnosis of  By: Loanne Drilling MD, Feliberto Stockley A     No past surgical history on file.  History   Social History  . Marital Status: Single    Spouse Name: N/A    Number of Children: N/A  . Years of Education: N/A   Occupational History  . Disabled    Social History Main Topics  . Smoking status: Never Smoker   . Smokeless tobacco: Not on file  . Alcohol Use: No  . Drug Use: No  . Sexual Activity: Not on file   Other Topics Concern  . Not on file   Social History Narrative   Widowed 1998   Lives alone    Current Outpatient Prescriptions on File Prior to Visit  Medication Sig Dispense Refill  . ALPRAZolam (XANAX) 1 MG tablet Take 1 mg by mouth 3 (three) times daily.        Marland Kitchen aspirin 81 MG tablet Take 81 mg by mouth daily.      Marland Kitchen MILK THISTLE PO Take 1 capsule by mouth daily.      . Multiple Vitamins-Minerals (CENTRUM SILVER PO) Take 1 tablet by mouth daily.        . mupirocin cream (BACTROBAN) 2 % Apply topically 3 (three) times daily.  15 g  0  . sildenafil (VIAGRA) 100 MG tablet Take 100 mg by mouth as needed.         No current facility-administered  medications on file prior to visit.    No Known Allergies  Family History  Problem Relation Age of Onset  . Cancer Neg Hx    BP 130/80  Pulse 112  Temp(Src) 98.2 F (36.8 C) (Oral)  Ht 6' (1.829 m)  Wt 209 lb (94.802 kg)  BMI 28.34 kg/m2  SpO2 92%  Review of Systems Denies hematuria and cramps.    Objective:   Physical Exam VITAL SIGNS:  See vs page GENERAL: no distress NECK: There is no palpable thyroid enlargement.  No thyroid nodule is palpable.  No palpable lymphadenopathy at the anterior neck. LUNGS:  Clear to auscultation HEART:  Regular rate and rhythm without murmurs noted. Normal S1,S2.   Ext: right AKA  Lab Results  Component Value Date   WBC 2.7* 11/26/2013   HGB 11.3* 11/26/2013   HCT 32.7* 11/26/2013   PLT 66.0 Repeated and verified X2.* 11/26/2013   GLUCOSE 104* 11/26/2013   CHOL 104 11/26/2013   TRIG 438.0 Triglyceride is over 400; calculations on Lipids are invalid.* 11/26/2013   HDL 26.70* 11/26/2013   LDLDIRECT 29.4 02/26/2012   ALT 68* 09/27/2009  AST 66* 09/27/2009   NA 141 11/26/2013   K 3.9 11/26/2013   CL 103 11/26/2013   CREATININE 0.6 11/26/2013   BUN 12 11/26/2013   CO2 31 11/26/2013   TSH 7.52* 11/26/2013   PSA 0.63 11/26/2013   i reviewed electrocardiogram: no change since 2013    Assessment & Plan:  Hypothyroidism, new Dyslipidemia: this can be exac by hypothyroidism. Anemia: improved Leukopenia: stable    Subjective:   Patient here for Medicare annual wellness visit and management of other chronic and acute problems.     Risk factors: advanced age.    Roster of Physicians Providing Medical Care to Patient:  See "snapshot"   Activities of Daily Living: In your present state of health, do you have any difficulty performing the following activities?:  Preparing food and eating?: No  Bathing yourself: No Getting dressed: No  Using the toilet: No  Moving around from place to place: does well with crutches In the past year have you  fallen or had a near fall?: No    Home Safety: Has smoke detector and wears seat belts. No firearms. No excess sun exposure.  Diet and Exercise  Current exercise habits: good despite disability.   Dietary issues discussed: pt reports a healthy diet.    Depression Screen  Q1: Over the past two weeks, have you felt down, depressed or hopeless? no  Q2: Over the past two weeks, have you felt little interest or pleasure in doing things? no   The following portions of the patient's history were reviewed and updated as appropriate: allergies, current medications, past family history, past medical history, past social history, past surgical history and problem list.  Past Medical History  Diagnosis Date  . HEPATITIS C 03/24/2007  . ANXIETY 03/24/2007  . CIRRHOSIS 03/24/2007  . HYPERLIPIDEMIA 09/12/2009  . Pancytopenia 12/23/2007  . 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 UROLITHIASIS, HX OF 12/23/2007  . Chronic pain   . 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 FRACTURE, WRIST, LEFT 10/12/2009    Qualifier: Diagnosis of  By: Loanne Drilling MD, Jacelyn Pi   . Hypocalcemia 12/23/2007    Qualifier: Diagnosis of  By: Loanne Drilling MD, Bonnee Zertuche A     No past surgical history on file.  History   Social History  . Marital Status: Single    Spouse Name: N/A    Number of Children: N/A  . Years of Education: N/A   Occupational History  . Disabled    Social History Main Topics  . Smoking status: Never Smoker   . Smokeless tobacco: Not on file  . Alcohol Use: No  . Drug Use: No  . Sexual Activity: Not on file   Other Topics Concern  . Not on file   Social History Narrative   Widowed 1998   Lives alone    Current Outpatient Prescriptions on File Prior to Visit  Medication Sig Dispense Refill  . ALPRAZolam (XANAX) 1 MG tablet Take 1 mg by mouth 3 (three) times daily.        Marland Kitchen aspirin 81 MG tablet Take 81 mg by mouth daily.       Marland Kitchen MILK THISTLE PO Take 1 capsule by mouth daily.      . Multiple Vitamins-Minerals (CENTRUM SILVER PO) Take 1 tablet by mouth daily.        . mupirocin cream (BACTROBAN) 2 % Apply  topically 3 (three) times daily.  15 g  0  . sildenafil (VIAGRA) 100 MG tablet Take 100 mg by mouth as needed.         No current facility-administered medications on file prior to visit.    No Known Allergies  Family History  Problem Relation Age of Onset  . Cancer Neg Hx     BP 130/80  Pulse 112  Temp(Src) 98.2 F (36.8 C) (Oral)  Ht 6' (1.829 m)  Wt 209 lb (94.802 kg)  BMI 28.34 kg/m2  SpO2 92%  Review of Systems  Denies hearing loss, and visual loss Objective:   Vision:  Sees opthalmologist Hearing: grossly normal Body mass index:  See vs page Msk: pt easily but slowly performs "get-up-and-go" from a sitting position with the aid of crutches Cognitive Impairment Assessment: cognition, memory and judgment appear normal.  remembers 2/3 at 5 minutes (? Effort).  excellent recall.  can easily read and write a sentence.  alert and oriented x 3   Assessment:   Medicare wellness utd on preventive parameters    Plan:   During the course of the visit the patient was educated and counseled about appropriate screening and preventive services including:        Fall prevention   Diabetes screening  Nutrition counseling  Pt says he had colonoscopy at [place] in 2012 Vaccines / LABS Zostavax / Pneumococcal Vaccine  today  PSA  Patient Instructions (the written plan) was given to the patient.   we discussed code status.  pt requests full code, but would not want to be started or maintained on artificial life-support measures if there was not a reasonable chance of recovery.  He wants his son (same name) to help make decisions also.

## 2013-11-27 LAB — PTH, INTACT AND CALCIUM
Calcium: 10 mg/dL (ref 8.4–10.5)
PTH: 31.3 pg/mL (ref 14.0–72.0)

## 2013-12-01 NOTE — Telephone Encounter (Signed)
Waiting on [employer] to call back and discuss.

## 2013-12-07 ENCOUNTER — Telehealth: Payer: Self-pay | Admitting: Endocrinology

## 2013-12-07 NOTE — Telephone Encounter (Signed)
Why was pt put on thyroid medicine. Pt states he doesn't know why it was rx for him

## 2013-12-08 NOTE — Telephone Encounter (Signed)
Pt informed that he was put on thyroid medication due to the fact that his thyroid was overactive last time his labs were done.

## 2013-12-15 ENCOUNTER — Telehealth: Payer: Self-pay | Admitting: Endocrinology

## 2013-12-15 NOTE — Telephone Encounter (Addendum)
Form faxed to [employer]. Pt informed.

## 2013-12-15 NOTE — Telephone Encounter (Signed)
Please call [employer] for wheelchair repair # [phone] F [phone] he needs a rx for the repairs to be done

## 2013-12-23 ENCOUNTER — Ambulatory Visit: Payer: Self-pay | Admitting: Ophthalmology

## 2013-12-23 LAB — CBC WITH DIFFERENTIAL/PLATELET
BASOS ABS: 0 10*3/uL (ref 0.0–0.1)
BASOS PCT: 0.6 %
EOS ABS: 0.1 10*3/uL (ref 0.0–0.7)
Eosinophil %: 2.5 %
HCT: 38 % — ABNORMAL LOW (ref 40.0–52.0)
HGB: 12.5 g/dL — AB (ref 13.0–18.0)
LYMPHS ABS: 0.7 10*3/uL — AB (ref 1.0–3.6)
Lymphocyte %: 20.8 %
MCH: 33.9 pg (ref 26.0–34.0)
MCHC: 33 g/dL (ref 32.0–36.0)
MCV: 103 fL — ABNORMAL HIGH (ref 80–100)
MONOS PCT: 10.1 %
Monocyte #: 0.3 x10 3/mm (ref 0.2–1.0)
Neutrophil #: 2.3 10*3/uL (ref 1.4–6.5)
Neutrophil %: 66 %
PLATELETS: 50 10*3/uL — AB (ref 150–440)
RBC: 3.7 10*6/uL — AB (ref 4.40–5.90)
RDW: 18.7 % — ABNORMAL HIGH (ref 11.5–14.5)
WBC: 3.5 10*3/uL — ABNORMAL LOW (ref 3.8–10.6)

## 2013-12-29 ENCOUNTER — Telehealth: Payer: Self-pay | Admitting: *Deleted

## 2013-12-29 NOTE — Telephone Encounter (Signed)
Called [employer]. Left voice mail stating that we had not received any form or call from [employer] stating they needed chart notes. On voice mail left phone number/fax number to contact medical records for chart note release.

## 2013-12-29 NOTE — Telephone Encounter (Signed)
Patient called about his scooter, he said he still does not have it.  He said the company needs his chart notes  [employer] power chair is the company.

## 2013-12-29 NOTE — Telephone Encounter (Signed)
Called medical records. They state they have received the record release. Records should be faxed this week.

## 2014-01-05 ENCOUNTER — Ambulatory Visit: Payer: Self-pay | Admitting: Ophthalmology

## 2014-10-18 ENCOUNTER — Ambulatory Visit: Payer: Medicare Other | Admitting: Endocrinology

## 2014-11-15 ENCOUNTER — Other Ambulatory Visit: Payer: Self-pay | Admitting: Endocrinology

## 2014-11-20 NOTE — Op Note (Signed)
PATIENT NAME:  [name], [name] MR#:  [id number] DATE OF BIRTH:  June 18, 1951  DATE OF PROCEDURE:  01/05/2014  PREOPERATIVE DIAGNOSIS: Visually significant cataract of the left eye.   POSTOPERATIVE DIAGNOSIS: Visually significant cataract of the left eye.   OPERATIVE PROCEDURE: Cataract extraction by phacoemulsification with implant of intraocular lens to right eye.   SURGEON: Birder Robson, MD.   ANESTHESIA:  1.  Managed anesthesia care.  2. Topical tetracaine drops followed by 2% Xylocaine jelly applied in the preoperative holding area.   COMPLICATIONS: None.   TECHNIQUE:  Stop and chop.  DESCRIPTION OF PROCEDURE: The patient was examined and consented in the preoperative holding area where the aforementioned topical anesthesia was applied to the left eye and then brought back to the Operating Room where the left eye was prepped and draped in the usual sterile ophthalmic fashion and a lid speculum was placed. A paracentesis was created with the side port blade and the anterior chamber was filled with viscoelastic. A near clear corneal incision was performed with the steel keratome. A continuous curvilinear capsulorrhexis was performed with a cystotome followed by the capsulorrhexis forceps. Hydrodissection and hydrodelineation were carried out with BSS on a blunt cannula. The lens was removed in a stop and chop technique and the remaining cortical material was removed with the irrigation-aspiration handpiece. The capsular bag was inflated with viscoelastic and the Tecnis ZCB00 18.5-diopter lens, serial number [id number] was placed in the capsular bag without complication. The remaining viscoelastic was removed from the eye with the irrigation-aspiration handpiece. The wounds were hydrated. The anterior chamber was flushed with Miostat and the eye was inflated to physiologic pressure. 0.1 mL of cefuroxime concentration 10 mg/mL was placed in the anterior chamber. The wounds were found to be water  tight. The eye was dressed with Vigamox. The patient was given protective glasses to wear throughout the day and a shield with which to sleep tonight. The patient was also given drops with which to begin a drop regimen today and will follow-up with me in one day.   Please note that a single 10-0 nylon suture was placed through the incision at the end of the case to ensure watertight globe.        ____________________________ Livingston Diones. Mahamadou Weltz, MD wlp:dmm D: 01/05/2014 15:27:25 ET T: 01/05/2014 19:18:00 ET JOB#: [id number]  cc: Chaniah Cisse L. Ryder Man, MD, <Dictator> Livingston Diones Shermaine Rivet MD ELECTRONICALLY SIGNED 01/08/2014 12:49

## 2014-12-14 ENCOUNTER — Other Ambulatory Visit: Payer: Self-pay | Admitting: Endocrinology

## 2015-01-04 ENCOUNTER — Telehealth: Payer: Self-pay | Admitting: Endocrinology

## 2015-01-04 MED ORDER — LEVOTHYROXINE SODIUM 50 MCG PO TABS: ORAL_TABLET | ORAL | Status: DC

## 2015-01-04 NOTE — Telephone Encounter (Signed)
Rx sent for a 30 day supply pending pt's up coming appointment.

## 2015-01-04 NOTE — Telephone Encounter (Signed)
Pt needs refill on his thyroid medicine , he has a cpe schedule for this Thursday 01/06/15 @ 830

## 2015-01-06 ENCOUNTER — Encounter: Payer: Self-pay | Admitting: Endocrinology

## 2015-01-06 ENCOUNTER — Ambulatory Visit (INDEPENDENT_AMBULATORY_CARE_PROVIDER_SITE_OTHER): Payer: Medicare Other | Admitting: Endocrinology

## 2015-01-06 VITALS — BP 152/90 | HR 86 | Temp 98.2°F | Ht 70.0 in | Wt 200.0 lb

## 2015-01-06 DIAGNOSIS — Z0000 Encounter for general adult medical examination without abnormal findings: Secondary | ICD-10-CM

## 2015-01-06 DIAGNOSIS — D649 Anemia, unspecified: Secondary | ICD-10-CM

## 2015-01-06 DIAGNOSIS — Z125 Encounter for screening for malignant neoplasm of prostate: Secondary | ICD-10-CM

## 2015-01-06 DIAGNOSIS — Z23 Encounter for immunization: Secondary | ICD-10-CM

## 2015-01-06 DIAGNOSIS — I10 Essential (primary) hypertension: Secondary | ICD-10-CM | POA: Diagnosis not present

## 2015-01-06 DIAGNOSIS — E785 Hyperlipidemia, unspecified: Secondary | ICD-10-CM | POA: Diagnosis not present

## 2015-01-06 DIAGNOSIS — D61818 Other pancytopenia: Secondary | ICD-10-CM | POA: Diagnosis not present

## 2015-01-06 DIAGNOSIS — S62109S Fracture of unspecified carpal bone, unspecified wrist, sequela: Secondary | ICD-10-CM

## 2015-01-06 LAB — BASIC METABOLIC PANEL
BUN: 9 mg/dL (ref 6–23)
CALCIUM: 9.9 mg/dL (ref 8.4–10.5)
CHLORIDE: 104 meq/L (ref 96–112)
CO2: 30 mEq/L (ref 19–32)
CREATININE: 0.6 mg/dL (ref 0.40–1.50)
GFR: 143.48 mL/min (ref >60.00)
GLUCOSE: 105 mg/dL — AB (ref 70–99)
Potassium: 4.3 mEq/L (ref 3.5–5.1)
SODIUM: 138 meq/L (ref 135–145)

## 2015-01-06 LAB — URINALYSIS, ROUTINE W REFLEX MICROSCOPIC
Bilirubin Urine: NEGATIVE
KETONES UR: NEGATIVE
Leukocytes, UA: NEGATIVE
NITRITE: NEGATIVE
PH: 6 (ref 5.0–8.0)
Specific Gravity, Urine: 1.025 (ref 1.000–1.030)
Total Protein, Urine: NEGATIVE
Urine Glucose: NEGATIVE
Urobilinogen, UA: 1 (ref 0.0–1.0)

## 2015-01-06 LAB — LIPID PANEL
CHOL/HDL RATIO: 4
Cholesterol: 119 mg/dL (ref 0–200)
HDL: 33.9 mg/dL — AB (ref >39.00)
LDL Cholesterol: 50 mg/dL (ref 0–99)
NonHDL: 85.1
TRIGLYCERIDES: 175 mg/dL — AB (ref 0.0–149.0)
VLDL: 35 mg/dL (ref 0.0–40.0)

## 2015-01-06 LAB — PSA, MEDICARE: PSA: 1.82 ng/ml (ref 0.10–4.00)

## 2015-01-06 LAB — HEPATIC FUNCTION PANEL
ALBUMIN: 4.8 g/dL (ref 3.5–5.2)
ALK PHOS: 84 U/L (ref 39–117)
ALT: 16 U/L (ref 0–53)
AST: 19 U/L (ref 0–37)
Bilirubin, Direct: 0.3 mg/dL (ref 0.0–0.3)
Total Bilirubin: 1.6 mg/dL — ABNORMAL HIGH (ref 0.2–1.2)
Total Protein: 7.9 g/dL (ref 6.0–8.3)

## 2015-01-06 LAB — IBC PANEL
Iron: 192 ug/dL — ABNORMAL HIGH (ref 42–165)
SATURATION RATIOS: 52.3 % — AB (ref 20.0–50.0)
TRANSFERRIN: 262 mg/dL (ref 212.0–360.0)

## 2015-01-06 LAB — CBC WITH DIFFERENTIAL/PLATELET
BASOS PCT: 0.3 % (ref 0.0–3.0)
Basophils Absolute: 0 10*3/uL (ref 0.0–0.1)
EOS PCT: 1.1 % (ref 0.0–5.0)
Eosinophils Absolute: 0 10*3/uL (ref 0.0–0.7)
HCT: 37 % — ABNORMAL LOW (ref 39.0–52.0)
Hemoglobin: 12.6 g/dL — ABNORMAL LOW (ref 13.0–17.0)
LYMPHS ABS: 0.5 10*3/uL — AB (ref 0.7–4.0)
Lymphocytes Relative: 18.3 % (ref 12.0–46.0)
MCHC: 34.1 g/dL (ref 30.0–36.0)
MCV: 94.6 fl (ref 78.0–100.0)
MONOS PCT: 8 % (ref 3.0–12.0)
Monocytes Absolute: 0.2 10*3/uL (ref 0.1–1.0)
Neutro Abs: 2.1 10*3/uL (ref 1.4–7.7)
Neutrophils Relative %: 72.3 % (ref 43.0–77.0)
PLATELETS: 63 10*3/uL — AB (ref 150.0–400.0)
RBC: 3.91 Mil/uL — ABNORMAL LOW (ref 4.22–5.81)
RDW: 17.7 % — AB (ref 11.5–15.5)
WBC: 2.9 10*3/uL — AB (ref 4.0–10.5)

## 2015-01-06 LAB — TSH: TSH: 0.69 u[IU]/mL (ref 0.35–4.50)

## 2015-01-06 MED ORDER — LEVOTHYROXINE SODIUM 50 MCG PO TABS: 50.0000 ug | ORAL_TABLET | Freq: Every day | ORAL | Status: DC

## 2015-01-06 MED ORDER — LOSARTAN POTASSIUM 50 MG PO TABS: 50.0000 mg | ORAL_TABLET | Freq: Every day | ORAL | Status: DC

## 2015-01-06 NOTE — Patient Instructions (Addendum)
i have sent a prescription to your pharmacy, for the blood pressure Please come in for a blood pressure recheck in 1 month.   blood tests are requested for you today.  We'll let you know about the results. please consider these measures for your health:  minimize alcohol.  do not use tobacco products.  have a colonoscopy at least every 10 years from age 64.  keep firearms safely stored.  always use seat belts.  have working smoke alarms in your home.  see an eye doctor and dentist regularly.  never drive under the influence of alcohol or drugs (including prescription drugs).  those with fair skin should take precautions against the sun. it is critically important to prevent falling down (keep floor areas well-lit, dry, and free of loose objects.  If you have a cane, walker, or wheelchair, you should use it, even for short trips around the house.  Also, try not to rush) good diet and exercise significantly improve your health.  please let me know if you wish to be referred to a dietician.  high blood sugar is very risky to your health.  you should see an eye doctor and dentist every year.  It is very important to get all recommended vaccinations.

## 2015-01-06 NOTE — Progress Notes (Signed)
Subjective:    Patient ID: Derek Rough., male    DOB: 1951-02-18, 64 y.o.   MRN: [id number]  HPI The state of at least three ongoing medical problems is addressed today, with interval history of each noted here:  Anemia: he denies BRBPR.   Dyslipidemia: he denies chest pain.   Leukopenia/pancytopenia: he denies fever.  He has 20 years of moderate pain at both UE's, and assoc weakness.   HTN: he takes cozaar as rx'ed Past Medical History  Diagnosis Date  . HEPATITIS C 03/24/2007  . ANXIETY 03/24/2007  . CIRRHOSIS 03/24/2007  . HYPERLIPIDEMIA 09/12/2009  . Pancytopenia 12/23/2007  . 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 UROLITHIASIS, HX OF 12/23/2007  . Chronic pain   . 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 FRACTURE, WRIST, LEFT 10/12/2009    Qualifier: Diagnosis of  By: Loanne Drilling MD, Jacelyn Pi   . Hypocalcemia 12/23/2007    Qualifier: Diagnosis of  By: Loanne Drilling MD, Shaena Parkerson A     No past surgical history on file.  History   Social History  . Marital Status: Widowed    Spouse Name: N/A  . Number of Children: N/A  . Years of Education: N/A   Occupational History  . Disabled    Social History Main Topics  . Smoking status: Never Smoker   . Smokeless tobacco: Not on file  . Alcohol Use: No  . Drug Use: No  . Sexual Activity: Not on file   Other Topics Concern  . Not on file   Social History Narrative   Widowed 1998   Lives alone    Current Outpatient Prescriptions on File Prior to Visit  Medication Sig Dispense Refill  . ALPRAZolam (XANAX) 1 MG tablet Take 1 mg by mouth 3 (three) times daily.      Marland Kitchen levothyroxine (SYNTHROID, LEVOTHROID) 50 MCG tablet Take 1 tablet by mouth every day before breakfast. *APPOINTMENT NEEDED FOR FURTHER REFILLS* 30 tablet 0  . mupirocin cream (BACTROBAN) 2 % Apply topically 3 (three) times daily. 15 g 0  . sildenafil (VIAGRA) 100 MG tablet Take 100 mg by  mouth as needed.       No current facility-administered medications on file prior to visit.    No Known Allergies  Family History  Problem Relation Age of Onset  . Cancer Neg Hx     BP 152/90 mmHg  Pulse 86  Temp(Src) 98.2 F (36.8 C) (Oral)  Ht 5\' 10"  (1.778 m)  Wt 200 lb (90.719 kg)  BMI 28.70 kg/m2  SpO2 98%    Review of Systems Denies sob.  He has chronic generalized pain (due to 1996 fall--sees pain mgmt).     Objective:   Physical Exam VITAL SIGNS:  See vs page GENERAL: no distress NECK: There is no palpable thyroid enlargement.  No thyroid nodule is palpable.  No palpable.  lymphadenopathy at the anterior neck. LUNGS:  Clear to auscultation HEART:  Regular rate and rhythm without murmurs noted. Normal S1,S2.   MSK: strength is 4/5 at both UE's Neuro: sensation is intact to touch on the hands.     Lab Results  Component Value Date   WBC 2.9* 01/06/2015   HGB 12.6* 01/06/2015   HCT 37.0* 01/06/2015   PLT 63.0* 01/06/2015   GLUCOSE 105* 01/06/2015   CHOL 119 01/06/2015   TRIG 175.0* 01/06/2015  HDL 33.90* 01/06/2015   LDLDIRECT 29.4 02/26/2012   LDLCALC 50 01/06/2015   ALT 16 01/06/2015   AST 19 01/06/2015   NA 138 01/06/2015   K 4.3 01/06/2015   CL 104 01/06/2015   CREATININE 0.60 01/06/2015   BUN 9 01/06/2015   CO2 30 01/06/2015   TSH 0.69 01/06/2015   PSA 1.82 01/06/2015       Assessment & Plan:  pancytopenia: stable but elevated iron persists (he sees hepatology at [facility]).  Pt is advised to avoid fe supplements.  We'll follow. Hypothyroidism: well-replaced. HTN: well-controlled. UE sxs due to injury in 1996, persistent: I told pt it is unlikely he qualifies for scooter.   Subjective:   Patient here for Medicare annual wellness visit and management of other chronic and acute problems.     Risk factors: advanced age    40 of Physicians Providing Medical Care to Patient:  See "snapshot"   Activities of Daily Living: In your  present state of health, do you have any difficulty performing the following activities (has home assistant)?:  Preparing food and eating?: yes Bathing yourself: yes Getting dressed: No  Using the toilet: No  Moving around from place to place: yes In the past year have you fallen or had a near fall?: No    Home Safety: Has smoke detector and wears seat belts. No firearms. No excess sun exposure.  Diet and Exercise  Current exercise habits: limited by health probs. Dietary issues discussed: pt reports a healthy diet.   Depression Screen  Q1: Over the past two weeks, have you felt down, depressed or hopeless? no (sees plovsky for anxiety) Q2: Over the past two weeks, have you felt little interest or pleasure in doing things? no   The following portions of the patient's history were reviewed and updated as appropriate: allergies, current medications, past family history, past medical history, past social history, past surgical history and problem list.   Review of Systems  Denies hearing loss, and visual loss Objective:   Vision:  [facility] Hearing: grossly normal Body mass index:  See vs page Msk: pt performs "get-up-and-go" from a sitting position with crutches Cognitive Impairment Assessment: cognition, memory and judgment appear normal.  remembers 3/3 at 5 minutes.  excellent recall.  can easily read and write a sentence.  alert and oriented x 3.    Assessment:   Medicare wellness utd on preventive parameters    Plan:   During the course of the visit the patient was educated and counseled about appropriate screening and preventive services including:       Fall prevention   Diabetes screening  Nutrition counseling   Vaccines / LABS Zostavax / Pneumococcal Vaccine  today  PSA  Patient Instructions (the written plan) was given to the patient.

## 2015-01-06 NOTE — Progress Notes (Signed)
we discussed code status.  pt requests full code. 

## 2015-01-07 LAB — PTH, INTACT AND CALCIUM
Calcium: 9.4 mg/dL (ref 8.4–10.5)
PTH: 51 pg/mL (ref 14–64)

## 2015-04-14 DIAGNOSIS — S78111A Complete traumatic amputation at level between right hip and knee, initial encounter: Secondary | ICD-10-CM | POA: Insufficient documentation

## 2015-06-20 ENCOUNTER — Other Ambulatory Visit (HOSPITAL_COMMUNITY): Payer: Self-pay | Admitting: Psychiatry

## 2015-07-07 ENCOUNTER — Other Ambulatory Visit (HOSPITAL_COMMUNITY): Payer: Self-pay | Admitting: Psychiatry

## 2015-09-27 ENCOUNTER — Other Ambulatory Visit (HOSPITAL_COMMUNITY): Payer: Self-pay | Admitting: Psychiatry

## 2015-09-27 ENCOUNTER — Other Ambulatory Visit: Payer: Self-pay | Admitting: Endocrinology

## 2015-12-16 ENCOUNTER — Other Ambulatory Visit: Payer: Self-pay | Admitting: Endocrinology

## 2015-12-16 ENCOUNTER — Other Ambulatory Visit (HOSPITAL_COMMUNITY): Payer: Self-pay | Admitting: Psychiatry

## 2016-03-22 ENCOUNTER — Other Ambulatory Visit: Payer: Self-pay | Admitting: Endocrinology

## 2016-03-22 NOTE — Telephone Encounter (Signed)
Please refill x 1 Ov is due  

## 2016-05-22 DIAGNOSIS — R768 Other specified abnormal immunological findings in serum: Secondary | ICD-10-CM | POA: Insufficient documentation

## 2016-06-12 ENCOUNTER — Other Ambulatory Visit: Payer: Self-pay | Admitting: Endocrinology

## 2016-08-30 ENCOUNTER — Other Ambulatory Visit: Payer: Self-pay | Admitting: Endocrinology

## 2016-09-25 ENCOUNTER — Encounter: Payer: Self-pay | Admitting: Endocrinology

## 2016-09-25 ENCOUNTER — Ambulatory Visit (INDEPENDENT_AMBULATORY_CARE_PROVIDER_SITE_OTHER): Payer: Medicare Other | Admitting: Endocrinology

## 2016-09-25 VITALS — BP 152/88 | HR 74 | Ht 70.0 in | Wt 212.0 lb

## 2016-09-25 DIAGNOSIS — Z125 Encounter for screening for malignant neoplasm of prostate: Secondary | ICD-10-CM

## 2016-09-25 DIAGNOSIS — E039 Hypothyroidism, unspecified: Secondary | ICD-10-CM | POA: Diagnosis not present

## 2016-09-25 DIAGNOSIS — I10 Essential (primary) hypertension: Secondary | ICD-10-CM

## 2016-09-25 DIAGNOSIS — Z0000 Encounter for general adult medical examination without abnormal findings: Secondary | ICD-10-CM | POA: Diagnosis not present

## 2016-09-25 LAB — VITAMIN D 25 HYDROXY (VIT D DEFICIENCY, FRACTURES): VITD: 20.89 ng/mL — AB (ref 30.00–100.00)

## 2016-09-25 LAB — TSH: TSH: 2.07 u[IU]/mL (ref 0.35–4.50)

## 2016-09-25 LAB — PSA, MEDICARE: PSA: 1.49 ng/mL (ref 0.10–4.00)

## 2016-09-25 MED ORDER — LOSARTAN POTASSIUM 100 MG PO TABS: 100.0000 mg | ORAL_TABLET | Freq: Every day | ORAL | 3 refills | Status: DC

## 2016-09-25 NOTE — Progress Notes (Signed)
we discussed code status.  pt requests full code. 

## 2016-09-25 NOTE — Patient Instructions (Addendum)
I have sent a prescription to your pharmacy, to increase the losartan.  blood tests are requested for you today.  We'll let you know about the results. good diet and exercise significantly improve your health.  please let me know if you wish to be referred to a dietician.  high blood sugar is very risky to your health.  you should see an eye doctor and dentist every year.  It is very important to get all recommended vaccinations.  Please consider these measures for your health:  minimize alcohol.  Do not use tobacco products.  Have a colonoscopy at least every 10 years from age 65.  Keep firearms safely stored.  Always use seat belts.  have working smoke alarms in your home.  See an eye doctor and dentist regularly.  Never drive under the influence of alcohol or drugs (including prescription drugs).  Those with fair skin should take precautions against the sun, and should carefully examine their skin once per month, for any new or changed moles. It is critically important to prevent falling down (keep floor areas well-lit, dry, and free of loose objects.  If you have a cane, walker, or wheelchair, you should use it, even for short trips around the house.  Wear flat-soled shoes.  Also, try not to rush). Please return in 1 year.

## 2016-09-25 NOTE — Progress Notes (Signed)
Subjective:    Patient ID: [name]., male    DOB: Dec 31, 1950, 66 y.o.   MRN: [id number]  HPI  The state of at least three ongoing medical problems is addressed today, with interval history of each noted here: HTN: denies SOB Hypocalcemia: denies leg cramps.  He takes vit-D, 4000 units/day.  Hypothyroidism: no weight change.  Past Medical History:  Diagnosis Date  . 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 ANXIETY 03/24/2007  . Chronic pain   . CIRRHOSIS 03/24/2007  . 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 FRACTURE, WRIST, LEFT 10/12/2009   Qualifier: Diagnosis of  By: Loanne Drilling MD, Jacelyn Pi   . HEPATITIS C 03/24/2007  . HYPERLIPIDEMIA 09/12/2009  . Hypocalcemia 12/23/2007   Qualifier: Diagnosis of  By: Loanne Drilling MD, Jacelyn Pi   . Pancytopenia 12/23/2007  . 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 UROLITHIASIS, HX OF 12/23/2007    No past surgical history on file.  Social History   Social History  . Marital status: Widowed    Spouse name: N/A  . Number of children: N/A  . Years of education: N/A   Occupational History  . Disabled    Social History Main Topics  . Smoking status: Never Smoker  . Smokeless tobacco: Never Used  . Alcohol use No  . Drug use: No  . Sexual activity: Not on file   Other Topics Concern  . Not on file   Social History Narrative   Widowed 1998   Lives alone    Current Outpatient Prescriptions on File Prior to Visit  Medication Sig Dispense Refill  . ALPRAZolam (XANAX) 1 MG tablet Take 1 mg by mouth 3 (three) times daily.      Marland Kitchen levothyroxine (SYNTHROID, LEVOTHROID) 50 MCG tablet TAKE 1 TABLET BY MOUTH EVERY DAY 90 tablet 0  . mupirocin cream (BACTROBAN) 2 % Apply topically 3 (three) times daily. 15 g 0  . sildenafil (VIAGRA) 100 MG tablet Take 100 mg by mouth as needed.       No current facility-administered medications on file prior to visit.     No Known Allergies  Family History  Problem  Relation Age of Onset  . Cancer Neg Hx     BP (!) 152/88   Pulse 74   Ht 5\' 10"  (1.778 m)   Wt 212 lb (96.2 kg)   SpO2 97%   BMI 30.42 kg/m   Review of Systems Denies chest pain.  He has mild dry skin.      Objective:   Physical Exam VITAL SIGNS:  See vs page GENERAL: no distress NECK: There is no palpable thyroid enlargement.  No thyroid nodule is palpable.  No palpable lymphadenopathy at the anterior neck. LUNGS:  Clear to auscultation HEART:  Regular rate and rhythm without murmurs noted. Normal S1,S2.     I personally reviewed electrocardiogram tracing (today): Indication: HTN Impression: NSR.  No MI.  No hypertrophy.  PVC's.  Compared to 2016: PVC's are new  Lab Results  Component Value Date   TSH 2.07 09/25/2016   25-OH vit-D=21    Assessment & Plan:  Vit-D deficiency: I advised non-rx vit-D Hypothyroidism: well-replaced.  Please continue the same medication. HTN: borderline control.  Please continue the same medication for now.  Recheck 1-2 weeks.    Subjective:   Patient here for Medicare annual wellness visit and management of other chronic and acute problems.  Risk factors: advanced age    36 of Physicians Providing Medical Care to Patient:  See "snapshot"    Activities of Daily Living: In your present state of health, do you have any difficulty performing the following activities (lives alone)?:  Preparing food and eating?: No  Bathing yourself: yes (has home care worker) Getting dressed: No  Using the toilet: No  Moving around from place to place: No  In the past year have you fallen or had a near fall?: No    Home Safety: Has smoke detector and wears seat belts. No firearms. No excess sun exposure.    Diet and Exercise  Current exercise habits: limited by health problems Dietary issues discussed: pt reports a healthy diet.   Depression Screen  Q1: Over the past two weeks, have you felt down, depressed or hopeless? no  Q2: Over the  past two weeks, have you felt little interest or pleasure in doing things? no   The following portions of the patient's history were reviewed and updated as appropriate: allergies, current medications, past family history, past medical history, past social history, past surgical history and problem list.   Review of Systems  Denies hearing loss, and visual loss Objective:   Vision:  Sees opthalmologist [facility], so he declines [facility] today.  Hearing: grossly normal Body mass index:  See vs page Msk: pt slowly (due to amputation), performs "get-up-and-go" from a sitting .  Cognitive Impairment Assessment: cognition, memory and judgment appear normal.  remembers 3/3 at 5 minutes.  excellent recall.  can easily read and write a sentence.  alert and oriented x 3.    Assessment:   Medicare wellness utd on preventive parameters    Plan:   During the course of the visit the patient was educated and counseled about appropriate screening and preventive services including:       Fall prevention   Diabetes screening  Nutrition counseling   LABS are ordered today Pneumococcal Vaccine is given today  PSA.   Patient Instructions (the written plan) was given to the patient.

## 2016-09-26 LAB — PTH, INTACT AND CALCIUM
Calcium: 9.5 mg/dL (ref 8.6–10.3)
PTH: 54 pg/mL (ref 14–64)

## 2016-09-26 MED ORDER — VITAMIN D (ERGOCALCIFEROL) 1.25 MG (50000 UNIT) PO CAPS: 50000.0000 [IU] | ORAL_CAPSULE | ORAL | 0 refills | Status: AC

## 2016-09-27 ENCOUNTER — Telehealth: Payer: Self-pay | Admitting: Endocrinology

## 2016-09-27 NOTE — Telephone Encounter (Signed)
please call patient: BP was high Please come in for recheck 1-2 weeks.

## 2016-09-27 NOTE — Telephone Encounter (Signed)
I contacted the patient and requested a call back to schedule nurse visit for BP to be rechecked.

## 2016-10-19 ENCOUNTER — Other Ambulatory Visit: Payer: Self-pay | Admitting: Endocrinology

## 2016-11-19 ENCOUNTER — Telehealth: Payer: Self-pay | Admitting: Endocrinology

## 2016-11-19 NOTE — Telephone Encounter (Signed)
Ok to move up appt to tue or wed

## 2016-11-19 NOTE — Telephone Encounter (Signed)
Pt is asking if the handwritten note is complete that was dropped off last week

## 2016-11-19 NOTE — Telephone Encounter (Signed)
Patient stated due to other doctors appointments he could not move his appointment up.

## 2016-11-19 NOTE — Telephone Encounter (Signed)
I contacted the patient and advised him we have received his letter. Dr. Loanne Drilling wanted to wait until his office visit on 11/22/2016 to further discuss his letter. Patient stated his lawyer needed to contents of the letter by 11/21/2016 due to his case going to court. Patient wanted to know if this could be done earlier than his scheduled appointment? Thanks !

## 2016-11-22 ENCOUNTER — Encounter: Payer: Self-pay | Admitting: Endocrinology

## 2016-11-22 ENCOUNTER — Ambulatory Visit (INDEPENDENT_AMBULATORY_CARE_PROVIDER_SITE_OTHER): Payer: Medicare Other | Admitting: Endocrinology

## 2016-11-22 ENCOUNTER — Ambulatory Visit: Payer: Medicare Other | Admitting: Endocrinology

## 2016-11-22 VITALS — BP 138/80 | HR 91 | Ht 70.0 in | Wt 214.2 lb

## 2016-11-22 DIAGNOSIS — G547 Phantom limb syndrome without pain: Secondary | ICD-10-CM | POA: Diagnosis not present

## 2016-11-22 MED ORDER — TRIAMCINOLONE ACETONIDE 0.1 % EX CREA: 1.0000 "application " | TOPICAL_CREAM | Freq: Four times a day (QID) | CUTANEOUS | 0 refills | Status: DC

## 2016-11-22 NOTE — Progress Notes (Signed)
Subjective:    Patient ID: [name]., male    DOB: 07-16-1951, 66 y.o.   MRN: [id number]  HPI Pt returns for f/u of chronic pain syndrome.  he has a nurse who comes in daily to help him.  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 He has assoc chronic bilat wrist pain, due to crutches.  He sees ortho for this.   He says he has been disabled since age 79, but he still drives. Past Medical History:  Diagnosis Date  . 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 ANXIETY 03/24/2007  . Chronic pain   . CIRRHOSIS 03/24/2007  . 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 FRACTURE, WRIST, LEFT 10/12/2009   Qualifier: Diagnosis of  By: Loanne Drilling MD, Jacelyn Pi   . HEPATITIS C 03/24/2007  . HYPERLIPIDEMIA 09/12/2009  . Hypocalcemia 12/23/2007   Qualifier: Diagnosis of  By: Loanne Drilling MD, Jacelyn Pi   . Pancytopenia 12/23/2007  . 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 UROLITHIASIS, HX OF 12/23/2007    No past surgical history on file.  Social History   Social History  . Marital status: Widowed    Spouse name: N/A  . Number of children: N/A  . Years of education: N/A   Occupational History  . Disabled    Social History Main Topics  . Smoking status: Never Smoker  . Smokeless tobacco: Never Used  . Alcohol use No  . Drug use: No  . Sexual activity: Not on file   Other Topics Concern  . Not on file   Social History Narrative   Widowed 1998   Lives alone    Current Outpatient Prescriptions on File Prior to Visit  Medication Sig Dispense Refill  . ALPRAZolam (XANAX) 1 MG tablet Take 1 mg by mouth 3 (three) times daily.      Marland Kitchen levothyroxine (SYNTHROID, LEVOTHROID) 50 MCG tablet TAKE 1 TABLET BY MOUTH EVERY DAY 90 tablet 0  . losartan (COZAAR) 100 MG tablet Take 1 tablet (100 mg total) by mouth daily. 90 tablet 3  . sildenafil (VIAGRA) 100 MG tablet Take 100 mg by mouth as needed.      . mupirocin cream (BACTROBAN) 2 % Apply topically 3 (three) times  daily. (Patient not taking: Reported on 11/22/2016) 15 g 0   No current facility-administered medications on file prior to visit.     No Known Allergies  Family History  Problem Relation Age of Onset  . Cancer Neg Hx     BP 138/80   Pulse 91   Ht 5\' 10"  (1.778 m)   Wt 214 lb 3.2 oz (97.2 kg)   SpO2 97%   BMI 30.73 kg/m    Review of Systems He has occasional falls.  He has diffuse itching    Objective:   Physical Exam VITAL SIGNS:  See vs page GENERAL: no distress Gait: steady with crutches Ext: right AKA.  no deformity of the left foot Pulses: dorsalis pedis intact bilat.   CV: no left leg edema.   Skin:  no ulcer on the left foot, but the skin is dry.  normal color and temp on the left foot Neuro: sensation is intact to touch on the left foot  PSYCH: Alert and well-oriented.  Does not appear anxious nor depressed.       Assessment & Plan:  Chronic pain syndrome.  I told pt I can't rate him totally  disabled if he still drives.  HTN: well-controlled Chronic itching: he needs ongoing rx.    Patient Instructions  I'll do the form you brought.  There are many places doing disability examinations.  Please contact one of them.  Please continue the same medication for blood pressure.   I have sent a prescription to your pharmacy, for the itching I'll see you next time.

## 2016-11-22 NOTE — Patient Instructions (Addendum)
I'll do the form you brought.  There are many places doing disability examinations.  Please contact one of them.  Please continue the same medication for blood pressure.   I have sent a prescription to your pharmacy, for the itching I'll see you next time.

## 2016-11-23 ENCOUNTER — Telehealth: Payer: Self-pay | Admitting: Endocrinology

## 2016-11-23 DIAGNOSIS — G894 Chronic pain syndrome: Secondary | ICD-10-CM

## 2016-11-23 NOTE — Telephone Encounter (Signed)
Patient notified referral has been placed.

## 2016-11-23 NOTE — Telephone Encounter (Signed)
See message and please advise, Thanks!  

## 2016-11-23 NOTE — Telephone Encounter (Signed)
Patient stated he need a referral to [facility]   [phone]  He ask for a call back

## 2016-11-28 NOTE — Telephone Encounter (Signed)
Left a voice mail for the clinic to call me back- I did see that the referral has been placed and it does state the clinic will call the patient once the referral is reviewed

## 2016-11-28 NOTE — Telephone Encounter (Signed)
Patient stated that the pain clinic haven't received the referral, he state his forms he sent need to be Fax before Monday. Please call him he asked

## 2016-11-28 NOTE — Telephone Encounter (Signed)
Unable to reach the patient by phone- see note below orders have ben placed and he will get a call from the clinic

## 2016-11-28 NOTE — Telephone Encounter (Signed)
Pt called in to speak with you regarding his paper work and referral, requests call back as soon as possible.

## 2016-11-29 NOTE — Telephone Encounter (Signed)
Patient called back and stated to disregard everything, [employer] [facility] is giving him 45 extra days.

## 2016-11-29 NOTE — Telephone Encounter (Deleted)
Patient stated that [facility] need a copy of patient last visit and a letter head stating patient need help during the day with the necessities of every day life, like bathing, getting on the toilet, getting dressed,etc.  He stated he will call back tomorrow with the fax number.  Patient stated the forms was faxed back, but Dr Loanne Drilling did not fill it out he just signed it. They did not except it, it needs to be filled out.

## 2016-11-30 NOTE — Telephone Encounter (Signed)
Patient stated the pain clinic at [facility] haven't received his referral, please advise   Fax# [phone]  Phone # [phone]

## 2016-11-30 NOTE — Telephone Encounter (Signed)
Please forward message to [facility] 

## 2016-12-03 NOTE — Telephone Encounter (Signed)
[name] Self [phone]  [name] would like a call as soon as the referral to the pain clinic has been sent. Would like this sent ASAP, he stated the appointments are already into June.

## 2017-01-01 ENCOUNTER — Other Ambulatory Visit: Payer: Self-pay | Admitting: Endocrinology

## 2017-03-05 ENCOUNTER — Other Ambulatory Visit: Payer: Self-pay | Admitting: Endocrinology

## 2017-04-03 ENCOUNTER — Telehealth: Payer: Self-pay | Admitting: Endocrinology

## 2017-04-03 DIAGNOSIS — G894 Chronic pain syndrome: Secondary | ICD-10-CM

## 2017-04-03 NOTE — Telephone Encounter (Signed)
Patient called in reference to note below about referral. Informed patient that there was a referral placed according to note below. Gave patient phone number to Dr. Mirna Mires. Patient stated he would call and make an appointment.

## 2017-04-03 NOTE — Telephone Encounter (Signed)
Call patient to discuss the pain rehab referral. Patient is concerned.   [phone] Dr. Phineas Douglas needs a referral

## 2017-04-04 ENCOUNTER — Other Ambulatory Visit: Payer: Self-pay | Admitting: Endocrinology

## 2017-04-04 NOTE — Telephone Encounter (Signed)
Patient called to check the status of the notes below. Please call patient to update on the status of his pain rehab referral. Call [phone]

## 2017-04-04 NOTE — Telephone Encounter (Signed)
Patient called in reference to note below. Patient stated he would like to talk to someone today. Please call patient and advise. OK to leave message.

## 2017-04-05 NOTE — Telephone Encounter (Signed)
Patient is still waiting on an update on this referral. Call patient as soon as possible. Patient is very upset that he has not heard anything.

## 2017-04-05 NOTE — Telephone Encounter (Signed)
Referral was done a few months ago.  Pt was declined by [facility].  I have redone the referral, to see if another practice could see pt.

## 2017-04-05 NOTE — Telephone Encounter (Signed)
Patient calling in to check on notes below. Please call patient and advise.

## 2017-04-05 NOTE — Telephone Encounter (Signed)
Routing to you °

## 2017-04-08 NOTE — Telephone Encounter (Signed)
Patient calling to check the status of the referral. I advised the patient that Dr. Loanne Drilling did place another referral. Patient stated he would call Dr. Brown Human office to see if they would see him.

## 2017-04-08 NOTE — Telephone Encounter (Signed)
Patient called again to see if Dr. Loanne Drilling sent the referral to Dr. Eulogio Bear office, he called and they did not have a referral. Call Dr. Brown Human office to ensure that they have the referral at [phone] and fax it to [phone]. Patient states he will keep calling to make sure this is done. Please execute as soon as possible and call the patient so that he is aware.

## 2017-04-10 NOTE — Telephone Encounter (Signed)
Ok, please forward this message to [facility]

## 2017-04-10 NOTE — Telephone Encounter (Signed)
[name] from Dr. Brown Human office called in reference to patient stating to them that he was told by stephanie that a referral was sent for him to Dr. Brown Human office in August. [name] stated they do not have anything and she wanted to know if a referral was sent. Please call Dr. Brown Human office and advise at number provided or [phone].

## 2017-04-10 NOTE — Telephone Encounter (Signed)
Patient wants his referral to go to Dr. Brandy Hale P:[phone].  Please call back once sent.  Ty,  -LL

## 2017-04-11 NOTE — Telephone Encounter (Signed)
Patient calling to check on status of referral to Dr. Mirna Mires.  He would like a call back once referral is placed.  He mentioned going to buy marijuana for his pain b/c he is not able to get the help he needs.  Ty,  -LL

## 2017-04-11 NOTE — Telephone Encounter (Signed)
I tried to call patient but I got no answer. I have referred this to [facility] as Dr. Loanne Drilling advised.

## 2017-04-11 NOTE — Telephone Encounter (Signed)
Patient calling back to check on status of referral to Dr. Mirna Mires.  -LL

## 2017-04-19 NOTE — Telephone Encounter (Signed)
Patient called in reference to notes below. I informed patient that the [facility] was taking care of this and he would be hearing from the [facility].

## 2017-04-22 NOTE — Telephone Encounter (Signed)
Pt states he already went to [facility] , he wanted to go to  Tried to call this office for 2wk - no one will call me back to schedule this pt - informed pt is this who he wants to go to pt stated yes will try again to get this pt schedule  [facility] [address] [place], [place] [address] [phone] Called office to see what I need to schedule

## 2017-06-24 ENCOUNTER — Other Ambulatory Visit: Payer: Self-pay | Admitting: Endocrinology

## 2017-07-25 ENCOUNTER — Other Ambulatory Visit: Payer: Self-pay | Admitting: Endocrinology

## 2017-07-29 ENCOUNTER — Other Ambulatory Visit: Payer: Self-pay | Admitting: Endocrinology

## 2017-09-17 ENCOUNTER — Other Ambulatory Visit: Payer: Self-pay | Admitting: Endocrinology

## 2017-09-25 ENCOUNTER — Ambulatory Visit: Payer: Medicare Other | Admitting: Endocrinology

## 2017-11-08 ENCOUNTER — Other Ambulatory Visit: Payer: Self-pay | Admitting: Endocrinology

## 2017-11-09 NOTE — Telephone Encounter (Signed)
Please refill x 1 Ov is due  

## 2018-01-06 ENCOUNTER — Other Ambulatory Visit: Payer: Self-pay | Admitting: Endocrinology

## 2018-02-26 DIAGNOSIS — M1712 Unilateral primary osteoarthritis, left knee: Secondary | ICD-10-CM | POA: Insufficient documentation

## 2018-04-04 ENCOUNTER — Other Ambulatory Visit: Payer: Self-pay

## 2018-04-04 ENCOUNTER — Emergency Department: Payer: Medicare Other

## 2018-04-04 ENCOUNTER — Emergency Department
Admission: EM | Admit: 2018-04-04 | Discharge: 2018-04-04 | Disposition: A | Discharge status: Home / Self Care | Payer: Medicare Other | Source: Home / Self Care | Attending: Emergency Medicine | Admitting: Emergency Medicine

## 2018-04-04 DIAGNOSIS — Y999 Unspecified external cause status: Secondary | ICD-10-CM

## 2018-04-04 DIAGNOSIS — Y939 Activity, unspecified: Secondary | ICD-10-CM

## 2018-04-04 DIAGNOSIS — Z79899 Other long term (current) drug therapy: Secondary | ICD-10-CM

## 2018-04-04 DIAGNOSIS — X58XXXA Exposure to other specified factors, initial encounter: Secondary | ICD-10-CM

## 2018-04-04 DIAGNOSIS — K7200 Acute and subacute hepatic failure without coma: Secondary | ICD-10-CM | POA: Diagnosis not present

## 2018-04-04 DIAGNOSIS — E039 Hypothyroidism, unspecified: Secondary | ICD-10-CM | POA: Insufficient documentation

## 2018-04-04 DIAGNOSIS — R1031 Right lower quadrant pain: Secondary | ICD-10-CM

## 2018-04-04 DIAGNOSIS — S32020A Wedge compression fracture of second lumbar vertebra, initial encounter for closed fracture: Secondary | ICD-10-CM

## 2018-04-04 DIAGNOSIS — K7290 Hepatic failure, unspecified without coma: Secondary | ICD-10-CM | POA: Diagnosis not present

## 2018-04-04 DIAGNOSIS — Y929 Unspecified place or not applicable: Secondary | ICD-10-CM | POA: Insufficient documentation

## 2018-04-04 DIAGNOSIS — R109 Unspecified abdominal pain: Secondary | ICD-10-CM

## 2018-04-04 LAB — URINALYSIS, COMPLETE (UACMP) WITH MICROSCOPIC
BACTERIA UA: NONE SEEN
Bilirubin Urine: NEGATIVE
Glucose, UA: NEGATIVE mg/dL
KETONES UR: NEGATIVE mg/dL
Leukocytes, UA: NEGATIVE
Nitrite: NEGATIVE
PROTEIN: 100 mg/dL — AB
SQUAMOUS EPITHELIAL / LPF: NONE SEEN (ref 0–5)
Specific Gravity, Urine: 1.021 (ref 1.005–1.030)
pH: 5 (ref 5.0–8.0)

## 2018-04-04 LAB — COMPREHENSIVE METABOLIC PANEL
ALBUMIN: 4.9 g/dL (ref 3.5–5.0)
ALT: 19 U/L (ref 0–44)
ANION GAP: 14 (ref 5–15)
AST: 31 U/L (ref 15–41)
Alkaline Phosphatase: 102 U/L (ref 38–126)
BUN: 9 mg/dL (ref 8–23)
CALCIUM: 9.4 mg/dL (ref 8.9–10.3)
CHLORIDE: 103 mmol/L (ref 98–111)
CO2: 23 mmol/L (ref 22–32)
Creatinine, Ser: 0.62 mg/dL (ref 0.61–1.24)
GFR calc Af Amer: >60 mL/min (ref >60)
GLUCOSE: 122 mg/dL — AB (ref 70–99)
POTASSIUM: 3.6 mmol/L (ref 3.5–5.1)
Sodium: 140 mmol/L (ref 135–145)
Total Bilirubin: 2.8 mg/dL — ABNORMAL HIGH (ref 0.3–1.2)
Total Protein: 8.2 g/dL — ABNORMAL HIGH (ref 6.5–8.1)

## 2018-04-04 LAB — CBC
HCT: 30.9 % — ABNORMAL LOW (ref 40.0–52.0)
Hemoglobin: 10.5 g/dL — ABNORMAL LOW (ref 13.0–18.0)
MCH: 32.8 pg (ref 26.0–34.0)
MCHC: 34 g/dL (ref 32.0–36.0)
MCV: 96.5 fL (ref 80.0–100.0)
PLATELETS: 42 10*3/uL — AB (ref 150–440)
RBC: 3.2 MIL/uL — ABNORMAL LOW (ref 4.40–5.90)
RDW: 19.6 % — AB (ref 11.5–14.5)
WBC: 2.3 10*3/uL — AB (ref 3.8–10.6)

## 2018-04-04 LAB — LIPASE, BLOOD: Lipase: 22 U/L (ref 11–51)

## 2018-04-04 LAB — TROPONIN I

## 2018-04-04 MED ORDER — MORPHINE SULFATE (PF) 2 MG/ML IV SOLN
2.0000 mg | Freq: Once | INTRAVENOUS | Status: AC
  Administered 2018-04-04: 2 mg via INTRAVENOUS
  Filled 2018-04-04: qty 1

## 2018-04-04 MED ORDER — IOPAMIDOL (ISOVUE-370) INJECTION 76%
100.0000 mL | Freq: Once | INTRAVENOUS | Status: AC | PRN
  Administered 2018-04-04: 100 mL via INTRAVENOUS

## 2018-04-04 MED ORDER — MORPHINE SULFATE (PF) 4 MG/ML IV SOLN
4.0000 mg | Freq: Once | INTRAVENOUS | Status: AC
  Administered 2018-04-04: 4 mg via INTRAVENOUS
  Filled 2018-04-04: qty 1

## 2018-04-04 MED ORDER — ONDANSETRON HCL 4 MG/2ML IJ SOLN
4.0000 mg | Freq: Once | INTRAMUSCULAR | Status: AC
  Administered 2018-04-04: 4 mg via INTRAVENOUS
  Filled 2018-04-04: qty 2

## 2018-04-04 MED ORDER — ALPRAZOLAM 0.5 MG PO TABS
1.0000 mg | ORAL_TABLET | Freq: Once | ORAL | Status: AC
  Administered 2018-04-04: 1 mg via ORAL
  Filled 2018-04-04: qty 2

## 2018-04-04 NOTE — ED Triage Notes (Signed)
Pt c/o right flank pain since last night with nausea . States he has a hx of kidney stones states this feels the same

## 2018-04-04 NOTE — ED Notes (Signed)
Report given to Brooke, RN.

## 2018-04-04 NOTE — ED Notes (Signed)
Patient transported to CT 

## 2018-04-04 NOTE — Discharge Instructions (Signed)
Follow-up with your doctor, vascular surgery (for your narrowed arteries), and neurosurgery (regarding a small compression fracture in the back).  Please return to the emergency room right away if you are to develop a fever, severe nausea, your pain becomes severe or worsens, you are unable to keep food down, begin vomiting any dark or bloody fluid, you develop any dark or bloody stools, feel dehydrated, or other new concerns or symptoms arise.

## 2018-04-04 NOTE — ED Provider Notes (Signed)
[facility] Emergency Department Provider Note   ____________________________________________   First MD Initiated Contact with Patient 04/04/18 [phone]     (approximate)  I have reviewed the triage vital signs and the nursing notes.   HISTORY  Chief Complaint Flank Pain    HPI [name]. is a 67 y.o. male previous history of kidney stones, cirrhosis, chronic pain, hepatitis  Patient very pleasant.  Reports that about mid day yesterday began experiencing a pain in his right flank that he calls a "kidney stone" in his right kidney.  Reports he had the same about 7 years ago presenting the same when he had a 9 mm stone that had to be removed by urologist at that time.  Reports yesterday he thought he saw a small amount of blood in his urine and then began having back pain.  Its moderate to severe in intensity.  He did not come in last night because he did not have anyone to drive him to the hospital, and reports he did not feel that he needed to call an ambulance.  Some nausea but no vomiting.  No chest pain or trouble breathing.  No pain under the ribs, reports all the pain is looking in his right mid/lower back region.  He had a previous right leg amputation.  Takes oxycodone 30 mg daily few times a day, follows with pain medicine.  Is not taking his pain medication yet today    Past Medical History:  Diagnosis Date  . 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 ANXIETY 03/24/2007  . Chronic pain   . CIRRHOSIS 03/24/2007  . 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 FRACTURE, WRIST, LEFT 10/12/2009   Qualifier: Diagnosis of  By: Loanne Drilling MD, Jacelyn Pi   . HEPATITIS C 03/24/2007  . HYPERLIPIDEMIA 09/12/2009  . Hypocalcemia 12/23/2007   Qualifier: Diagnosis of  By: Loanne Drilling MD, Jacelyn Pi   . Pancytopenia 12/23/2007  . 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 UROLITHIASIS, HX OF 12/23/2007    Patient Active Problem List     Diagnosis Date Noted  . Chronic pain syndrome 11/23/2016  . Hypothyroidism 09/25/2016  . Cellulitis of right thigh 05/21/2012  . Pancytopenia ([place]) 02/26/2012  . Encounter for long-term (current) use of other medications 02/26/2012  . Screening for prostate cancer 02/26/2012  . Anemia 02/26/2012  . Closed fracture of carpal bone 10/12/2009  . Dyslipidemia 09/12/2009  . Phantom limb syndrome ([place]) 09/12/2009  . HYPOCALCEMIA 12/23/2007  . 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 UROLITHIASIS, HX OF 12/23/2007  . 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 HEPATITIS C 03/24/2007  . ANXIETY 03/24/2007  . CIRRHOSIS 03/24/2007    History reviewed. No pertinent surgical history.  Prior to Admission medications   Medication Sig Start Date End Date Taking? Authorizing Provider  ALPRAZolam Duanne Moron) 1 MG tablet Take 1 mg by mouth 3 (three) times daily.      [provider]  levothyroxine (SYNTHROID, LEVOTHROID) 50 MCG tablet TAKE 1 TABLET BY MOUTH EVERY DAY 01/06/18   Renato Shin, MD  losartan (COZAAR) 100 MG tablet TAKE 1 TABLET BY MOUTH DAILY 01/06/18   Renato Shin, MD  mupirocin cream (BACTROBAN) 2 % Apply topically 3 (three) times daily. Patient not taking: Reported on 11/22/2016 05/28/12   Fransico Meadow, PA-C  sildenafil (VIAGRA) 100 MG tablet Take 100 mg by mouth as needed.      [provider]  triamcinolone cream (KENALOG) 0.1 % APPLY TO THE AFFECTED AREA FOUR TIMES DAILY AS NEEDED FOR ITCHING 01/06/18   Renato Shin, MD    Allergies Patient has no known allergies.  Family History  Problem Relation Age of Onset  . Cancer Neg Hx     Social History Social History   Tobacco Use  . Smoking status: Never Smoker  . Smokeless tobacco: Never Used  Substance Use Topics  . Alcohol use: No    Alcohol/week: 0.0 standard drinks  . Drug use: No    Review of Systems Constitutional: No fever/chills Eyes: No visual changes. ENT: No sore throat. Cardiovascular:  Denies chest pain. Respiratory: Denies shortness of breath. Gastrointestinal: No abdominal pain, or other reports the pain is back in "kidney" on the right Some mild nausea.  No vomiting.  No diarrhea.  No constipation. Genitourinary: Negative for dysuria. Musculoskeletal: Negative for back pain. Skin: Negative for rash. Neurological: Negative for headaches, focal weakness or numbness.    ____________________________________________   PHYSICAL EXAM:  VITAL SIGNS: ED Triage Vitals  Enc Vitals Group     BP 04/04/18 0752 (!) 158/81     Pulse Rate 04/04/18 0752 88     Resp 04/04/18 0752 20     Temp 04/04/18 0752 97.9 F (36.6 C)     Temp Source 04/04/18 0752 Oral     SpO2 04/04/18 0752 100 %     Weight 04/04/18 0753 210 lb (95.3 kg)     Height 04/04/18 0753 6' (1.829 m)     Head Circumference --      Peak Flow --      Pain Score 04/04/18 0748 8     Pain Loc --      Pain Edu? --      Excl. in [place]? --     Constitutional: Alert and oriented.  Very pleasant, but sitting upright appears uncomfortable though I cannot find a comfortable position.  Appears in moderate pain. Eyes: Conjunctivae are normal. Head: Atraumatic. Nose: No congestion/rhinnorhea. Mouth/Throat: Mucous membranes are moist. Neck: No stridor.   Cardiovascular: Normal rate, regular rhythm. Grossly normal heart sounds.  Good peripheral circulation. Respiratory: Normal respiratory effort.  No retractions. Lungs CTAB.  Gastrointestinal: Soft and nontender. No distention.  There is right-sided CVA tenderness.  No left CVA tenderness.  Pain does not occur with eating. Musculoskeletal: No lower extremity tenderness nor edema.  Denies mid back or low back pain.  Reports the pain is on his right lower side. Neurologic:  Normal speech and language. No gross focal neurologic deficits are appreciated.  Skin:  Skin is warm, dry and intact. No rash noted. Psychiatric: Mood and affect are normal. Speech and behavior are  normal.  ____________________________________________   LABS (all labs ordered are listed, but only abnormal results are displayed)  Labs Reviewed  CBC - Abnormal; Notable for the following components:      Result Value   WBC 2.3 (*)    RBC 3.20 (*)    Hemoglobin 10.5 (*)    HCT 30.9 (*)    RDW 19.6 (*)    Platelets 42 (*)    All other components within normal limits  COMPREHENSIVE METABOLIC PANEL - Abnormal; Notable for the following components:   Glucose, Bld 122 (*)    Total Protein 8.2 (*)    Total Bilirubin 2.8 (*)    All other components within normal limits  URINALYSIS, COMPLETE (UACMP) WITH MICROSCOPIC - Abnormal; Notable for the following  components:   Color, Urine AMBER (*)    APPearance CLOUDY (*)    Hgb urine dipstick SMALL (*)    Protein, ur 100 (*)    All other components within normal limits  LIPASE, BLOOD  TROPONIN I   ____________________________________________  EKG  Reviewed enterotomy at 940 Heart rate 70 QRS 100 QTc 420 PR interval normal Normal sinus occasional PVC.  No evidence of ischemia. ____________________________________________  RADIOLOGY  Ct Renal Stone Study  Result Date: 04/04/2018 CLINICAL DATA:  Right flank pain and nausea EXAM: CT ABDOMEN AND PELVIS WITHOUT CONTRAST TECHNIQUE: Multidetector CT imaging of the abdomen and pelvis was performed following the standard protocol without oral or IV contrast. COMPARISON:  CT abdomen and pelvis March 24, 2005 FINDINGS: Lower chest: There is mild bibasilar atelectasis. There is no lung base edema or consolidation. There are foci of coronary artery calcification. Note right-sided thoracic aorta, unchanged. Hepatobiliary: No focal liver lesions are appreciable on this noncontrast enhanced study. Liver contour is subtly nodular. Gallbladder is mildly distended with multiple gallstones present. There is no gallbladder wall thickening. There is no appreciable biliary duct dilatation. Pancreas: No  pancreatic mass or inflammatory focus evident. Spleen: Spleen measures 17.0 x 14.9 x 8.4 cm with a measured splenic volume of 1,064 cubic cm. No focal splenic lesions are demonstrable. There are perisplenic varices. Adrenals/Urinary Tract: Adrenals bilaterally appear unremarkable. There is no evident renal mass or hydronephrosis on either side. There are several calcifications in the mid right kidney which are aligned in a linear fashion measuring between 3 and 6 mm in length. No calculi are noted in the left kidney. There is no ureteral calculus on either side. The urinary bladder is midline with slight urinary bladder wall thickening. Stomach/Bowel: Rectum is distended with stool. No perirectal wall thickening evident. There are multiple sigmoid diverticula without diverticulitis. There also descending colonic diverticula without diverticulitis. There is moderate stool in the colon elsewhere. No evident bowel wall or mesenteric thickening. There is no bowel obstruction. No free air or portal venous air. There are varices near the gastric cardia. Vascular/Lymphatic: There is aortoiliac atherosclerosis. There is also calcification in both proximal renal arteries. There is fairly extensive calcification in the proximal superior mesenteric artery. There is modest calcification in the proximal celiac artery. No evident aneurysm. No adenopathy is evident in the abdomen or pelvis. Reproductive: Prostate and seminal vesicles appear normal in size and contour. There is minimal prostatic calcification. No mass is seen in the pelvis. Other: The appendix appears normal. There is no ascites or abscess in the abdomen or pelvis. There is a minimal ventral hernia containing only fat. Musculoskeletal: There is degenerative change in the lumbar spine with vacuum phenomenon at L5-S1. There is anterior wedging of the L2 vertebral body, age uncertain. No blastic or lytic bone lesions are evident. Postoperative change noted in the  proximal right femur. There is atrophy of the right gluteal and proximal right thigh muscles. Muscles on the left appear normal. IMPRESSION: 1. Nonobstructing calculi in the right kidney. No hydronephrosis or ureteral calculus on either side. There is a degree of urinary bladder wall thickening, concerning for cystitis. Correlation with urinalysis in this regard advised. 2. Findings indicative of hepatics cirrhosis. Splenomegaly noted. There are perisplenic and perigastric varices. 3. Gallbladder distended with cholelithiasis. No gallbladder wall thickening evident. 4. 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 Note that there is a right-sided thoracic aorta. 5. Anterior wedging of the L2 vertebral body. Marked narrowing L5-S1 discs. 6.  Atrophy of the right buttocks and right thigh muscles. 7. Colonic diverticulosis without diverticulitis. No bowel obstruction. No abscess in the abdomen or pelvis. Rectum is somewhat distended with stool without perirectal wall thickening. Appendix appears normal. 8.  Minimal ventral hernia containing only fat. Aortic Atherosclerosis (ICD10-I70.0). Electronically Signed   By: Lowella Grip III M.D.   On: 04/04/2018 08:39   Ct Angio Abd/pel W And/or Wo Contrast  Result Date: 04/04/2018 CLINICAL DATA:  Pt with increased back and flank pain since last night. Renal stone study preformed earlier today. Pt states complicated hx of surgeries due to 3 story fall many years ago. EXAM: CTA ABDOMEN AND PELVIS WITH CONTRAST TECHNIQUE: Multidetector CT imaging of the abdomen and pelvis was performed using the standard protocol during bolus administration of intravenous contrast. Multiplanar reconstructed images and MIPs were obtained and reviewed to evaluate the vascular anatomy. CONTRAST:  177mL ISOVUE-370 IOPAMIDOL (ISOVUE-370) INJECTION 76% COMPARISON:  04/04/2018 FINDINGS: VASCULAR Aorta: Visualized descending thoracic aorta is mildly tortuous with scattered  calcified plaque. There is moderate calcified plaque in the infrarenal aorta. No aneurysm, dissection, or stenosis. Celiac: Partially calcified ostial plaque without high-grade stenosis, patent distally. SMA: Partially calcified ostial plaque extending over length of approximately 2.5 cm resulting in at least moderately severe stenosis. The vessel is patent distally with classic distal branch anatomy. Renals: Single bilaterally, both with calcified ostial plaque resulting in at least mild stenosis, patent distally. IMA: Patent without evidence of aneurysm, dissection, vasculitis or significant stenosis. Inflow: Moderate calcified plaque in bilateral common iliac arteries without high-grade stenosis or aneurysm. External and internal iliac arteries patent. Mild iliac tortuosity. Proximal Outflow: Calcified plaque involving bilateral common femoral arteries resulting in at least moderate stenosis. Origin occlusion of the right SFA. Veins: Patent hepatic veins, portal vein, SMV, splenic vein, bilateral renal veins, IVC, and iliac venous systems. Enhancing large gastric varices with a prominent left retroperitoneal splenorenal venous shunt. Review of the MIP images confirms the above findings. NON-VASCULAR Lower chest: Posterior right lower lobe calcified granuloma. Lung bases otherwise clear. No pleural or pericardial effusion Hepatobiliary: Mildly nodular liver contour without focal lesion or biliary ductal dilatation. The gallbladder is distended containing multiple subcentimeter gallstones. No definite wall thickening, abnormal enhancement, or adjacent inflammatory/edematous change. Pancreas: Unremarkable. No pancreatic ductal dilatation or surrounding inflammatory changes. Spleen: Splenomegaly at least 17.6 craniocaudal length without focal lesion. Adrenals/Urinary Tract: Adrenal glands are unremarkable. Kidneys are normal, without renal calculi, focal lesion, or hydronephrosis. Bladder is unremarkable.  Stomach/Bowel: Stomach is nondistended. Large gastric varices as above. Small bowel is nondilated. Normal appendix. Colon nondilated. Scattered distal descending and sigmoid diverticula, without significant adjacent inflammatory/edematous change or abscess. Rectal distention by fecal material. Lymphatic: No abdominal or pelvic adenopathy. Reproductive: Prostatic enlargement Other: No ascites.  No free air. Musculoskeletal: L2 compression fracture deformity as before; no bony retropulsion or posterior element involvement. Degenerative disc disease L5-S1. Fixation hardware in the proximal right femur, incompletely visualized. Right hip DJD. IMPRESSION: VASCULAR 1. 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 2. 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 Patent celiac axis and IMA likely render this clinically insignificant. 3. Origin occlusion of right superficial femoral artery. NON-VASCULAR 1. Subacute or chronic L2 compression fracture. If pain persists and intervention is considered, recommend MR lumbar spine or bone scan for further characterization of acuity. 2. Cirrhosis with portal venous hypertension as evidenced by splenomegaly, gastric varices, left splenorenal venous shunt. 3. Descending and sigmoid diverticulosis. 4.  Cholelithiasis Electronically Signed   By: Lucrezia Europe M.D.   On: 04/04/2018 10:27    CT imaging reviewed.  Discussed with gastroenterology as well as Dr. Lucky Cowboy. ____________________________________________   PROCEDURES  Procedure(s) performed: None  Procedures  Critical Care performed: No  ____________________________________________   INITIAL IMPRESSION / ASSESSMENT AND PLAN / ED COURSE  Pertinent labs & imaging results that were available during my care of the patient were reviewed by me and considered in my medical decision making (see chart for details).  Differential diagnosis includes  but is not limited to, abdominal perforation, aortic dissection, cholecystitis, appendicitis, diverticulitis, colitis, esophagitis/gastritis, kidney stone, pyelonephritis, urinary tract infection, aortic aneurysm. All are considered in decision and treatment plan. Based upon the patient's presentation and risk factors, his clinical history seem to suggest primarily likely renal etiology such as a kidney stone.  Reports same pain in the past, pain over the right CVA and on abdominal exam really does not have much at all for abdominal pain but rather reports all pain is located in the right flank in the region region of the right kidney.  We will proceed with pain control, labs, and CT scan without contrast to evaluate for possible renal disease.     Clinical Course as of Apr 06 1609  Fri Apr 04, 2018  1023 Patient pain improving, but requesting Xanax which she is prescribed 3 times daily.  Will provide.  Fully awake and alert sitting at the bedside.  Await results from CT with angiography performed to evaluate for vascular etiology for pain as noncontrast CT did not show any definite cause for right flank pain.   [MQ]  5784 Reviewed CTA imaging and labs with Dr. 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    [MQ]  1152 Case discussed with Dr. Dian Situ, reviewed CT angio and clinical history, exam. Advises follow-up with vascular outpatient, no acute.     Patient currently resting comfortably, pain improved. No distress. Reports he is doing well.   [MQ]    Clinical Course User Index [MQ] Delman Kitten, MD     ____________________________________________   FINAL CLINICAL IMPRESSION(S) / ED DIAGNOSES  Final diagnoses:  Acute right flank pain  Closed wedge compression fracture of second lumbar vertebra, initial encounter [facility])      NEW MEDICATIONS STARTED DURING THIS  VISIT:  Discharge Medication List as of 04/04/2018  1:49 PM       Note:  This document was prepared using Dragon voice recognition software and may include unintentional dictation errors.     Delman Kitten, MD 04/06/18 1610

## 2018-04-04 NOTE — ED Notes (Signed)
Pt stating that has had progressively worsening pain in his right flank. Pt stating "I know I have kidney stones." Pt stating urine "with trace of blood."

## 2018-04-07 ENCOUNTER — Encounter: Payer: Self-pay | Admitting: Emergency Medicine

## 2018-04-07 ENCOUNTER — Emergency Department: Payer: Medicare Other

## 2018-04-07 ENCOUNTER — Inpatient Hospital Stay
Admission: EM | Admit: 2018-04-07 | Discharge: 2018-04-11 | DRG: 443 | Disposition: A | Discharge status: Home Health | Payer: Medicare Other | Attending: Internal Medicine | Admitting: Internal Medicine

## 2018-04-07 ENCOUNTER — Other Ambulatory Visit: Payer: Self-pay

## 2018-04-07 DIAGNOSIS — E039 Hypothyroidism, unspecified: Secondary | ICD-10-CM | POA: Diagnosis present

## 2018-04-07 DIAGNOSIS — I10 Essential (primary) hypertension: Secondary | ICD-10-CM | POA: Diagnosis present

## 2018-04-07 DIAGNOSIS — Z89611 Acquired absence of right leg above knee: Secondary | ICD-10-CM | POA: Diagnosis not present

## 2018-04-07 DIAGNOSIS — E785 Hyperlipidemia, unspecified: Secondary | ICD-10-CM | POA: Diagnosis present

## 2018-04-07 DIAGNOSIS — E876 Hypokalemia: Secondary | ICD-10-CM | POA: Diagnosis present

## 2018-04-07 DIAGNOSIS — B1920 Unspecified viral hepatitis C without hepatic coma: Secondary | ICD-10-CM | POA: Diagnosis present

## 2018-04-07 DIAGNOSIS — K7200 Acute and subacute hepatic failure without coma: Secondary | ICD-10-CM | POA: Diagnosis present

## 2018-04-07 DIAGNOSIS — Z79899 Other long term (current) drug therapy: Secondary | ICD-10-CM

## 2018-04-07 DIAGNOSIS — I639 Cerebral infarction, unspecified: Secondary | ICD-10-CM

## 2018-04-07 DIAGNOSIS — E538 Deficiency of other specified B group vitamins: Secondary | ICD-10-CM | POA: Diagnosis present

## 2018-04-07 DIAGNOSIS — F419 Anxiety disorder, unspecified: Secondary | ICD-10-CM | POA: Diagnosis present

## 2018-04-07 DIAGNOSIS — K7460 Unspecified cirrhosis of liver: Secondary | ICD-10-CM | POA: Diagnosis present

## 2018-04-07 DIAGNOSIS — K7290 Hepatic failure, unspecified without coma: Secondary | ICD-10-CM | POA: Diagnosis present

## 2018-04-07 DIAGNOSIS — K7682 Hepatic encephalopathy: Secondary | ICD-10-CM | POA: Diagnosis present

## 2018-04-07 DIAGNOSIS — D696 Thrombocytopenia, unspecified: Secondary | ICD-10-CM | POA: Diagnosis present

## 2018-04-07 LAB — COMPREHENSIVE METABOLIC PANEL
ALK PHOS: 122 U/L (ref 38–126)
ALT: 19 U/L (ref 0–44)
ANION GAP: 8 (ref 5–15)
AST: 35 U/L (ref 15–41)
Albumin: 4.8 g/dL (ref 3.5–5.0)
BILIRUBIN TOTAL: 3 mg/dL — AB (ref 0.3–1.2)
BUN: 14 mg/dL (ref 8–23)
CALCIUM: 9.3 mg/dL (ref 8.9–10.3)
CO2: 25 mmol/L (ref 22–32)
CREATININE: 0.64 mg/dL (ref 0.61–1.24)
Chloride: 104 mmol/L (ref 98–111)
Glucose, Bld: 103 mg/dL — ABNORMAL HIGH (ref 70–99)
Potassium: 3.9 mmol/L (ref 3.5–5.1)
Sodium: 137 mmol/L (ref 135–145)
TOTAL PROTEIN: 7.8 g/dL (ref 6.5–8.1)

## 2018-04-07 LAB — URINALYSIS, COMPLETE (UACMP) WITH MICROSCOPIC
BACTERIA UA: NONE SEEN
Bilirubin Urine: NEGATIVE
Glucose, UA: NEGATIVE mg/dL
Ketones, ur: 5 mg/dL — AB
Leukocytes, UA: NEGATIVE
NITRITE: NEGATIVE
PROTEIN: 30 mg/dL — AB
Specific Gravity, Urine: 1.024 (ref 1.005–1.030)
Squamous Epithelial / LPF: NONE SEEN (ref 0–5)
pH: 5 (ref 5.0–8.0)

## 2018-04-07 LAB — CBC
HCT: 31.2 % — ABNORMAL LOW (ref 40.0–52.0)
HEMOGLOBIN: 10.8 g/dL — AB (ref 13.0–18.0)
MCH: 33.3 pg (ref 26.0–34.0)
MCHC: 34.5 g/dL (ref 32.0–36.0)
MCV: 96.5 fL (ref 80.0–100.0)
Platelets: 52 10*3/uL — ABNORMAL LOW (ref 150–440)
RBC: 3.23 MIL/uL — AB (ref 4.40–5.90)
RDW: 21.2 % — ABNORMAL HIGH (ref 11.5–14.5)
WBC: 4.8 10*3/uL (ref 3.8–10.6)

## 2018-04-07 LAB — TROPONIN I: Troponin I: <0.03 ng/mL (ref <0.03)

## 2018-04-07 LAB — MAGNESIUM: MAGNESIUM: 1.7 mg/dL (ref 1.7–2.4)

## 2018-04-07 LAB — AMMONIA: Ammonia: 54 umol/L — ABNORMAL HIGH (ref 9–35)

## 2018-04-07 MED ORDER — OXYCODONE HCL 5 MG PO TABS
5.0000 mg | ORAL_TABLET | Freq: Four times a day (QID) | ORAL | Status: DC | PRN
  Administered 2018-04-08 – 2018-04-09 (×3): 5 mg via ORAL
  Filled 2018-04-07 (×4): qty 1

## 2018-04-07 MED ORDER — ALPRAZOLAM 0.5 MG PO TABS
0.5000 mg | ORAL_TABLET | Freq: Three times a day (TID) | ORAL | Status: DC | PRN
  Administered 2018-04-08 – 2018-04-11 (×8): 0.5 mg via ORAL
  Filled 2018-04-07 (×9): qty 1

## 2018-04-07 MED ORDER — ONDANSETRON HCL 4 MG/2ML IJ SOLN: 4.0000 mg | Freq: Four times a day (QID) | INTRAMUSCULAR | Status: DC | PRN

## 2018-04-07 MED ORDER — SODIUM CHLORIDE 0.9 % IV SOLN
INTRAVENOUS | Status: AC
  Administered 2018-04-07: via INTRAVENOUS

## 2018-04-07 MED ORDER — LOSARTAN POTASSIUM 50 MG PO TABS
100.0000 mg | ORAL_TABLET | Freq: Every day | ORAL | Status: DC
  Administered 2018-04-08 – 2018-04-11 (×4): 100 mg via ORAL
  Filled 2018-04-07 (×4): qty 2

## 2018-04-07 MED ORDER — IBUPROFEN 400 MG PO TABS: 400.0000 mg | ORAL_TABLET | Freq: Four times a day (QID) | ORAL | Status: DC | PRN

## 2018-04-07 MED ORDER — LACTULOSE 10 GM/15ML PO SOLN
30.0000 g | Freq: Once | ORAL | Status: AC
  Administered 2018-04-07: 30 g via ORAL
  Filled 2018-04-07: qty 60

## 2018-04-07 MED ORDER — ONDANSETRON HCL 4 MG PO TABS: 4.0000 mg | ORAL_TABLET | Freq: Four times a day (QID) | ORAL | Status: DC | PRN

## 2018-04-07 MED ORDER — MORPHINE SULFATE (PF) 2 MG/ML IV SOLN
2.0000 mg | INTRAVENOUS | Status: DC | PRN
  Administered 2018-04-07 – 2018-04-11 (×9): 2 mg via INTRAVENOUS
  Filled 2018-04-07 (×9): qty 1

## 2018-04-07 MED ORDER — LEVOTHYROXINE SODIUM 50 MCG PO TABS
50.0000 ug | ORAL_TABLET | Freq: Every day | ORAL | Status: DC
  Administered 2018-04-08 – 2018-04-11 (×4): 50 ug via ORAL
  Filled 2018-04-07 (×4): qty 1

## 2018-04-07 MED ORDER — LACTULOSE 10 GM/15ML PO SOLN
15.0000 g | Freq: Three times a day (TID) | ORAL | Status: DC
  Administered 2018-04-08: 11:00:00 15 g via ORAL
  Filled 2018-04-07: qty 30

## 2018-04-07 NOTE — H&P (Signed)
[facility] NAME: Jamerius Barretta    MR#:  [id number]  DATE OF BIRTH:  Aug 26, 1950  DATE OF ADMISSION:  04/07/2018  PRIMARY CARE PHYSICIAN: Renato Shin, MD   REQUESTING/REFERRING PHYSICIAN: Alfred Levins, MD  CHIEF COMPLAINT:   Chief Complaint  Patient presents with  . Altered Mental Status    HISTORY OF PRESENT ILLNESS:  Warden Horrell  is a 67 y.o. male who presents with chief complaint as above.  Patient is confused, and his contribution to the information in the HPI is partial and only moderately reliable at best.  He does seem to have some sense of his current condition, stating that he understands that he is confused and "does not feel like himself".  His brother is at bedside and contributes to the history.  His brother states that he spoke with the patient on Saturday and Sunday and the patient seemed okay, but that today when the "cleaning lady" went to his house she told him that he did not seem to be acting like himself, and she called the brother.  The brother then stated that he went to the patient's house and found him significantly confused.  They then brought him to the ED for evaluation.  Here he was found to have an elevated ammonia level.  Patient and brother state that he has never had anything like this before, but that he has been following at [facility] for some hepatitis C study being performed there.  Given his current level of encephalopathy, hospitalist were called for admission  PAST MEDICAL HISTORY:   Past Medical History:  Diagnosis Date  . 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 ANXIETY 03/24/2007  . Chronic pain   . CIRRHOSIS 03/24/2007  . 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 FRACTURE, WRIST, LEFT 10/12/2009   Qualifier: Diagnosis of  By: Loanne Drilling MD, Jacelyn Pi   . HEPATITIS C 03/24/2007  . HYPERLIPIDEMIA 09/12/2009  . Hypocalcemia 12/23/2007   Qualifier: Diagnosis  of  By: Loanne Drilling MD, Jacelyn Pi   . Pancytopenia 12/23/2007  . 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 UROLITHIASIS, HX OF 12/23/2007     PAST SURGICAL HISTORY:   Past Surgical History:  Procedure Laterality Date  . ABOVE KNEE LEG AMPUTATION Right      SOCIAL HISTORY:   Social History   Tobacco Use  . Smoking status: Never Smoker  . Smokeless tobacco: Never Used  Substance Use Topics  . Alcohol use: Not Currently    Alcohol/week: 0.0 standard drinks     FAMILY HISTORY:   Family History  Problem Relation Age of Onset  . Heart disease Mother   . Cancer Neg Hx      DRUG ALLERGIES:  No Known Allergies  MEDICATIONS AT HOME:   Prior to Admission medications   Medication Sig Start Date End Date Taking? Authorizing Provider  ALPRAZolam Duanne Moron) 1 MG tablet Take 1 mg by mouth 3 (three) times daily.     Yes [provider]  levothyroxine (SYNTHROID, LEVOTHROID) 50 MCG tablet TAKE 1 TABLET BY MOUTH EVERY DAY 01/06/18  Yes Renato Shin, MD  losartan (COZAAR) 100 MG tablet TAKE 1 TABLET BY MOUTH DAILY 01/06/18  Yes Renato Shin, MD  oxycodone (ROXICODONE) 30 MG immediate release tablet Take 1 tablet by mouth 5 (five) times daily as needed. 03/24/18  Yes [provider]  mupirocin cream (BACTROBAN) 2 % Apply  topically 3 (three) times daily. Patient not taking: Reported on 11/22/2016 05/28/12   Fransico Meadow, PA-C  sildenafil (VIAGRA) 100 MG tablet Take 100 mg by mouth as needed.      [provider]  triamcinolone cream (KENALOG) 0.1 % APPLY TO THE AFFECTED AREA FOUR TIMES DAILY AS NEEDED FOR ITCHING Patient not taking: Reported on 04/07/2018 01/06/18   Renato Shin, MD    REVIEW OF SYSTEMS:  Review of Systems  Constitutional: Negative for chills, fever, malaise/fatigue and weight loss.  HENT: Negative for ear pain, hearing loss and tinnitus.   Eyes: Negative for blurred vision, double vision, pain and redness.  Respiratory: Negative for cough, hemoptysis and  shortness of breath.   Cardiovascular: Negative for chest pain, palpitations, orthopnea and leg swelling.  Gastrointestinal: Negative for abdominal pain, constipation, diarrhea, nausea and vomiting.  Genitourinary: Negative for dysuria, frequency and hematuria.  Musculoskeletal: Negative for back pain, joint pain and neck pain.  Skin:       No acne, rash, or lesions  Neurological: Negative for dizziness, tremors, focal weakness and weakness.       Confusion  Endo/Heme/Allergies: Negative for polydipsia. Does not bruise/bleed easily.  Psychiatric/Behavioral: Negative for depression. The patient is not nervous/anxious and does not have insomnia.     Due to the patient's encephalopathy, his review of systems is only moderately reliable VITAL SIGNS:   Vitals:   04/07/18 1733 04/07/18 1734  BP: (!) 155/80   Pulse: 100   Resp: 20   Temp: 99.7 F (37.6 C)   TempSrc: Oral   SpO2: 98%   Height:  6' (1.829 m)   Wt Readings from Last 3 Encounters:  04/04/18 95.3 kg  11/22/16 97.2 kg  09/25/16 96.2 kg    PHYSICAL EXAMINATION:  Physical Exam  Vitals reviewed. Constitutional: He appears well-developed and well-nourished. No distress.  HENT:  Head: Normocephalic and atraumatic.  Mouth/Throat: Oropharynx is clear and moist.  Eyes: Pupils are equal, round, and reactive to light. Conjunctivae and EOM are normal. No scleral icterus.  Neck: Normal range of motion. Neck supple. No JVD present. No thyromegaly present.  Cardiovascular: Normal rate, regular rhythm and intact distal pulses. Exam reveals no gallop and no friction rub.  No murmur heard. Respiratory: Effort normal and breath sounds normal. No respiratory distress. He has no wheezes. He has no rales.  GI: Soft. Bowel sounds are normal. He exhibits no distension. There is no tenderness.  Musculoskeletal: Normal range of motion. He exhibits no edema.  No arthritis, no gout  Lymphadenopathy:    He has no cervical adenopathy.   Neurological: He is alert. No cranial nerve deficit.  Patient is oriented to person and place, not to time, and not entirely to circumstance, though he does seem to have some sense that he does not feel "normal".  Patient does have asterixis  Skin: Skin is warm and dry. No rash noted. No erythema.  Psychiatric:  Unable to fully assess due to patient's confusion    LABORATORY PANEL:   CBC Recent Labs  Lab 04/07/18 1740  WBC 4.8  HGB 10.8*  HCT 31.2*  PLT 52*   ------------------------------------------------------------------------------------------------------------------  Chemistries  Recent Labs  Lab 04/07/18 1740 04/07/18 1830  NA 137  --   K 3.9  --   CL 104  --   CO2 25  --   GLUCOSE 103*  --   BUN 14  --   CREATININE 0.64  --   CALCIUM 9.3  --  MG  --  1.7  AST 35  --   ALT 19  --   ALKPHOS 122  --   BILITOT 3.0*  --    ------------------------------------------------------------------------------------------------------------------  Cardiac Enzymes Recent Labs  Lab 04/07/18 1830  TROPONINI <0.03   ------------------------------------------------------------------------------------------------------------------  RADIOLOGY:  Dg Chest 2 View  Result Date: 04/07/2018 CLINICAL DATA:  Confusion, not making sense, history cirrhosis EXAM: CHEST - 2 VIEW COMPARISON:  05/23/2006 FINDINGS: Upper normal heart size. RIGHT-side aortic arch on previous exam less well demonstrated on current study with LEFT paraspinal calcification identified at the aberrant LEFT subclavian artery as demonstrated on prior CT. Mediastinal contours and pulmonary vascularity otherwise normal. Atherosclerotic calcifications aorta. Minimal RIGHT basilar atelectasis. Lungs otherwise clear. No pleural effusion or pneumothorax. Bones demineralized. 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 Minimal RIGHT basilar atelectasis. Electronically Signed   By:  Lavonia Dana M.D.   On: 04/07/2018 19:11   Ct Head Wo Contrast  Result Date: 04/07/2018 CLINICAL DATA:  Confusion, not making sense, rambling sentences, altered behavior, history hyperlipidemia, cirrhosis EXAM: CT HEAD WITHOUT CONTRAST TECHNIQUE: Contiguous axial images were obtained from the base of the skull through the vertex without intravenous contrast. Sagittal and coronal MPR images reconstructed from axial data set. COMPARISON:  None FINDINGS: Brain: Generalized atrophy. Normal ventricular morphology. No midline shift or mass effect. Minimal small vessel chronic ischemic changes of deep cerebral white matter. No intracranial hemorrhage, mass lesion, evidence of acute infarction, or extra-axial fluid collection. Vascular: Extensive atherosclerotic calcifications of internal carotid and to lesser degree vertebral arteries at skull base Skull: Demineralized but intact Sinuses/Orbits: Clear Other: N/A IMPRESSION: Atrophy with small vessel chronic ischemic changes of deep cerebral white matter. No acute intracranial abnormalities. Electronically Signed   By: Lavonia Dana M.D.   On: 04/07/2018 19:08    EKG:   Orders placed or performed during the hospital encounter of 04/07/18  . ED EKG  . ED EKG  . EKG 12-Lead  . EKG 12-Lead  . EKG 12-Lead  . EKG 12-Lead    IMPRESSION AND PLAN:  Principal Problem:   Acute hepatic encephalopathy -patient is not chronically on lactulose at home, and has not had difficulty with his ammonia level previously per his and his brother's report, and seemingly concordantly with chart review.  First dose of lactulose given in the ED.  We will continue this medication here and check an a.m. ammonia level. Active Problems:   Hepatic cirrhosis (HCC) -avoid hepatotoxins, other treatment as above, patient has follow-up with [facility] on the 12th for his hep C study treatment and cirrhosis.   Anxiety -home dose anxiolytic, reduced dose so as not to exacerbate his encephalopathy    Dyslipidemia -Home dose antilipid   Hypothyroidism -home dose thyroid replacement  Chart review performed and case discussed with ED provider. Labs, imaging and/or ECG reviewed by provider and discussed with patient/family. Management plans discussed with the patient and/or family.  DVT PROPHYLAXIS: SubQ lovenox   GI PROPHYLAXIS:  None  ADMISSION STATUS: Inpatient     CODE STATUS: Full  TOTAL TIME TAKING CARE OF THIS PATIENT: 45 minutes.   Sophia Cubero FIELDING 04/07/2018, 8:58 PM  [facility]  Office  [phone]  CC: Primary care physician; Renato Shin, MD  Note:  This document was prepared using Dragon voice recognition software and may include unintentional dictation errors.

## 2018-04-07 NOTE — ED Notes (Signed)
Patient transported to CT 

## 2018-04-07 NOTE — ED Triage Notes (Signed)
Pt was brought here by brother for confusion. He is rambling and his sentences are not making sense. His home health called his brother and told him that the pt was not acting right and was very confused. He was seen here last Wednesday and they told him that he was dehydration. The brother brought him some food and water because pt told him he has not ate or drank in 4 days. Brother also reports that his pain medication could not be found.
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
## 2018-04-08 ENCOUNTER — Inpatient Hospital Stay: Admit: 2018-04-08 | Payer: Medicare Other

## 2018-04-08 ENCOUNTER — Inpatient Hospital Stay: Payer: Medicare Other

## 2018-04-08 DIAGNOSIS — K7200 Acute and subacute hepatic failure without coma: Principal | ICD-10-CM

## 2018-04-08 LAB — BASIC METABOLIC PANEL
ANION GAP: 9 (ref 5–15)
BUN: 12 mg/dL (ref 8–23)
CHLORIDE: 102 mmol/L (ref 98–111)
CO2: 26 mmol/L (ref 22–32)
Calcium: 9.2 mg/dL (ref 8.9–10.3)
Creatinine, Ser: 0.63 mg/dL (ref 0.61–1.24)
GFR calc Af Amer: >60 mL/min (ref >60)
GFR calc non Af Amer: >60 mL/min (ref >60)
Glucose, Bld: 136 mg/dL — ABNORMAL HIGH (ref 70–99)
POTASSIUM: 3.1 mmol/L — AB (ref 3.5–5.1)
Sodium: 137 mmol/L (ref 135–145)

## 2018-04-08 LAB — LIPID PANEL
Cholesterol: 103 mg/dL (ref 0–200)
HDL: 25 mg/dL — ABNORMAL LOW (ref >40)
LDL Cholesterol: 61 mg/dL (ref 0–99)
TRIGLYCERIDES: 86 mg/dL (ref <150)
Total CHOL/HDL Ratio: 4.1 RATIO
VLDL: 17 mg/dL (ref 0–40)

## 2018-04-08 LAB — URINE DRUG SCREEN, QUALITATIVE (ARMC ONLY)
AMPHETAMINES, UR SCREEN: NOT DETECTED
Barbiturates, Ur Screen: NOT DETECTED
Benzodiazepine, Ur Scrn: NOT DETECTED
COCAINE METABOLITE, UR ~~LOC~~: NOT DETECTED
Cannabinoid 50 Ng, Ur ~~LOC~~: NOT DETECTED
MDMA (Ecstasy)Ur Screen: NOT DETECTED
Methadone Scn, Ur: NOT DETECTED
Opiate, Ur Screen: POSITIVE — AB
Phencyclidine (PCP) Ur S: NOT DETECTED
TRICYCLIC, UR SCREEN: POSITIVE — AB

## 2018-04-08 LAB — CBC
HEMATOCRIT: 30.9 % — AB (ref 40.0–52.0)
HEMOGLOBIN: 10.9 g/dL — AB (ref 13.0–18.0)
MCH: 33.9 pg (ref 26.0–34.0)
MCHC: 35.3 g/dL (ref 32.0–36.0)
MCV: 96.1 fL (ref 80.0–100.0)
Platelets: 55 10*3/uL — ABNORMAL LOW (ref 150–440)
RBC: 3.22 MIL/uL — AB (ref 4.40–5.90)
RDW: 20.3 % — ABNORMAL HIGH (ref 11.5–14.5)
WBC: 3.1 10*3/uL — ABNORMAL LOW (ref 3.8–10.6)

## 2018-04-08 LAB — HEMOGLOBIN A1C
Hgb A1c MFr Bld: 3.6 % — ABNORMAL LOW (ref 4.8–5.6)
MEAN PLASMA GLUCOSE: 56.62 mg/dL

## 2018-04-08 LAB — VITAMIN B12: VITAMIN B 12: 245 pg/mL (ref 180–914)

## 2018-04-08 LAB — AMMONIA: Ammonia: 16 umol/L (ref 9–35)

## 2018-04-08 LAB — TSH: TSH: 3.067 u[IU]/mL (ref 0.350–4.500)

## 2018-04-08 MED ORDER — IBUPROFEN 400 MG PO TABS: 400.0000 mg | ORAL_TABLET | Freq: Four times a day (QID) | ORAL | Status: DC | PRN

## 2018-04-08 MED ORDER — ASPIRIN 81 MG PO CHEW
81.0000 mg | CHEWABLE_TABLET | Freq: Every day | ORAL | Status: DC
  Administered 2018-04-08 – 2018-04-11 (×4): 81 mg via ORAL
  Filled 2018-04-08 (×4): qty 1

## 2018-04-08 MED ORDER — POTASSIUM CHLORIDE CRYS ER 20 MEQ PO TBCR
40.0000 meq | EXTENDED_RELEASE_TABLET | Freq: Once | ORAL | Status: AC
  Administered 2018-04-08: 11:00:00 40 meq via ORAL
  Filled 2018-04-08: qty 2

## 2018-04-08 MED ORDER — LORAZEPAM 2 MG/ML IJ SOLN
1.0000 mg | Freq: Once | INTRAMUSCULAR | Status: AC
  Administered 2018-04-08: 20:00:00 1 mg via INTRAVENOUS
  Filled 2018-04-08: qty 1

## 2018-04-08 MED ORDER — LORAZEPAM 2 MG/ML IJ SOLN
1.0000 mg | Freq: Once | INTRAMUSCULAR | Status: AC
  Administered 2018-04-08: 1 mg via INTRAVENOUS
  Filled 2018-04-08: qty 1

## 2018-04-08 NOTE — Plan of Care (Signed)
  Problem: Clinical Measurements: Goal: Ability to maintain clinical measurements within normal limits will improve Outcome: Progressing Goal: Will remain free from infection Outcome: Progressing   Problem: Coping: Goal: Level of anxiety will decrease Outcome: Progressing   Problem: Pain Managment: Goal: General experience of comfort will improve Outcome: Progressing   Problem: Safety: Goal: Ability to remain free from injury will improve Outcome: Progressing   Problem: Skin Integrity: Goal: Risk for impaired skin integrity will decrease Outcome: Progressing

## 2018-04-08 NOTE — Progress Notes (Signed)
[facility] [place] NAME: Siddhant Sherrard    MR#:  [id number]  DATE OF BIRTH:  1950-10-12  SUBJECTIVE:   Patient seems confused and speech appears garbled  REVIEW OF SYSTEMS:    Unable to obtain   Tolerating Diet: yes      DRUG ALLERGIES:  No Known Allergies  VITALS:  Blood pressure (!) 143/75, pulse 88, temperature 99.5 F (37.5 C), temperature source Oral, resp. rate 18, height 6' (1.829 m), weight 93.9 kg, SpO2 98 %.  PHYSICAL EXAMINATION:  Constitutional: Appears well-developed and well-nourished. No distress. HENT: Normocephalic. Marland Kitchen Oropharynx is clear and moist.  Eyes: Conjunctivae and EOM are normal. PERRLA, no scleral icterus.  Neck: Normal ROM. Neck supple. No JVD. No tracheal deviation. CVS: RRR, S1/S2 +, no murmurs, no gallops, no carotid bruit.  Pulmonary: Effort and breath sounds normal, no stridor, rhonchi, wheezes, rales.  Abdominal: Soft. BS +,  no distension, tenderness, rebound or guarding.  Musculoskeletal: Normal range of motion. No edema and no tenderness.  Neuro: Alert. CN 2-12 grossly intact. No focal deficits. Patient may be a phasic and confused Skin: Skin is warm and dry. No rash noted. Psychiatric: Confused     LABORATORY PANEL:   CBC Recent Labs  Lab 04/08/18 0435  WBC 3.1*  HGB 10.9*  HCT 30.9*  PLT 55*   ------------------------------------------------------------------------------------------------------------------  Chemistries  Recent Labs  Lab 04/07/18 1740 04/07/18 1830 04/08/18 0435  NA 137  --  137  K 3.9  --  3.1*  CL 104  --  102  CO2 25  --  26  GLUCOSE 103*  --  136*  BUN 14  --  12  CREATININE 0.64  --  0.63  CALCIUM 9.3  --  9.2  MG  --  1.7  --   AST 35  --   --   ALT 19  --   --   ALKPHOS 122  --   --   BILITOT 3.0*  --   --    ------------------------------------------------------------------------------------------------------------------  Cardiac  Enzymes Recent Labs  Lab 04/04/18 0810 04/07/18 1830  TROPONINI <0.03 <0.03   ------------------------------------------------------------------------------------------------------------------  RADIOLOGY:  Dg Chest 2 View  Result Date: 04/07/2018 CLINICAL DATA:  Confusion, not making sense, history cirrhosis EXAM: CHEST - 2 VIEW COMPARISON:  05/23/2006 FINDINGS: Upper normal heart size. RIGHT-side aortic arch on previous exam less well demonstrated on current study with LEFT paraspinal calcification identified at the aberrant LEFT subclavian artery as demonstrated on prior CT. Mediastinal contours and pulmonary vascularity otherwise normal. Atherosclerotic calcifications aorta. Minimal RIGHT basilar atelectasis. Lungs otherwise clear. No pleural effusion or pneumothorax. Bones demineralized. 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 Minimal RIGHT basilar atelectasis. Electronically Signed   By: Lavonia Dana M.D.   On: 04/07/2018 19:11   Ct Head Wo Contrast  Result Date: 04/07/2018 CLINICAL DATA:  Confusion, not making sense, rambling sentences, altered behavior, history hyperlipidemia, cirrhosis EXAM: CT HEAD WITHOUT CONTRAST TECHNIQUE: Contiguous axial images were obtained from the base of the skull through the vertex without intravenous contrast. Sagittal and coronal MPR images reconstructed from axial data set. COMPARISON:  None FINDINGS: Brain: Generalized atrophy. Normal ventricular morphology. No midline shift or mass effect. Minimal small vessel chronic ischemic changes of deep cerebral white matter. No intracranial hemorrhage, mass lesion, evidence of acute infarction, or extra-axial fluid collection. Vascular: Extensive atherosclerotic calcifications of internal carotid and to lesser degree vertebral arteries at  skull base Skull: Demineralized but intact Sinuses/Orbits: Clear Other: N/A IMPRESSION: Atrophy with small vessel chronic ischemic changes  of deep cerebral white matter. No acute intracranial abnormalities. Electronically Signed   By: Lavonia Dana M.D.   On: 04/07/2018 19:08     ASSESSMENT AND PLAN:   67 year old male with history of hepatitis C and liver cirrhosis who presents with encephalopathy.  1.  Acute encephalopathy: Initially this is thought to be hepatic in nature however ammonia level is normal and patient still appears confused. Order MRI to evaluate for CVA Order echocardiogram and carotid Doppler Neurochecks every 4 hours Further work-up pending these results. Order B12 and TSH  2.  Liver cirrhosis with hepatitis C: Patient has outpatient follow[facility] at [facility]  3.  Hypokalemia: Replete and recheck in a.m.  4.  Hypothyroidism: Continue Synthroid and check TSH  5.  Essential hypertension: Continue losartan  6.  Thrombocytopenia: This is chronic in nature and due to liver cirrhosis   Management plans discussed with nursing  CODE STATUS: FULL  TOTAL TIME TAKING CARE OF THIS PATIENT: 29 minutes.     POSSIBLE D/C 1-2 days, DEPENDING ON CLINICAL CONDITION.   Yanin Muhlestein M.D on 04/08/2018 at 10:37 AM  Between 7am to 6pm - Pager - [phone] After 6pm go to [email or web address] - password [email or web address] [facility]  Office  [phone]  CC: Primary care physician; Renato Shin, MD  Note: This dictation was prepared with Dragon dictation along with smaller phrase technology. Any transcriptional errors that result from this process are unintentional.

## 2018-04-08 NOTE — Progress Notes (Signed)
PT Cancellation Note  Patient Details Name: [name]. MRN: [id number] DOB: [birth year]   Cancelled Treatment:    Reason Eval/Treat Not Completed: PT evaluation to be held at this time per nursing request secondary to pt's level of confusion.  Will attempt to see pt at a future date as medically appropriate.    Linus Salmons PT, DPT 04/08/18, 11:35 AM

## 2018-04-08 NOTE — Progress Notes (Signed)
OT Cancellation Note  Patient Details Name: [name] [name]. MRN: [id number] DOB: Feb 10, 1951   Cancelled Treatment:    Reason Eval/Treat Not Completed: Patient at procedure or test/ unavailable. Order received, chart reviewed. Pt out of room for testing. Will re-attempt OT evaluation at later date/time as pt is available and medically appropriate.  Jeni Salles, MPH, MS, OTR/L ascom [phone] 04/08/18, 10:47 AM

## 2018-04-08 NOTE — Progress Notes (Addendum)
Security code called because pt got out of bed with using bedside table, hitting, scratching and yelling at staff when attempting to help him back to bed. Pt tried to kick nurses.  Dr called and prn order received.  Nursing supervisor able pt place IV while pt in bed and MS given and then ativan IV. Dorna Bloom RN 2018  Pt still attempting to get out of bed, States he wants his pain medication.  Pt talking repetitively about numbers and not making any sense.  Called [facility] and asked for sitter for this pt. Dorna Bloom RN

## 2018-04-08 NOTE — Consult Note (Signed)
Reason for Consult:Altered mental status Referring Physician: [name], MD  CC: Confusion  HPI: [name]. is an 67 y.o. malewith pertinent history of cirrhosis presenting with altered mental status. He is very confused and unable to provide history. Per ED records, patient was brought to the ED by brother for confusion. He was noted to be rambling and his sentences were not making sense. His home health called his brother and reported that he was not acting himself and was very confused. He was recently seen in the ED on Wednesday for dehydration. The brother brought him some food and water because pt told him he has not ate or drank in 4 days. Brother also reports that his pain medication could not be found at that time concerning for possible overdose. Initial labs and urine negative for UTI, however ammonia was elevated at 54, stable hemoglobin, no leukocytosis.  Chest x-ray showed no infiltrate.  Head CT with no acute changes. He was admitted for further work up.  Past Medical History:  Diagnosis Date  . 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 ANXIETY 03/24/2007  . Chronic pain   . CIRRHOSIS 03/24/2007  . 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 FRACTURE, WRIST, LEFT 10/12/2009   Qualifier: Diagnosis of  By: Loanne Drilling MD, Jacelyn Pi   . HEPATITIS C 03/24/2007  . HYPERLIPIDEMIA 09/12/2009  . Hypocalcemia 12/23/2007   Qualifier: Diagnosis of  By: Loanne Drilling MD, Jacelyn Pi   . Pancytopenia 12/23/2007  . 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 UROLITHIASIS, HX OF 12/23/2007    Past Surgical History:  Procedure Laterality Date  . ABOVE KNEE LEG AMPUTATION Right     Family History  Problem Relation Age of Onset  . Heart disease Mother   . Cancer Neg Hx     Social History:  reports that he has never smoked. He has never used smokeless tobacco. He reports that he drank alcohol. He reports that he does not use drugs.  No Known  Allergies  Medications:  I have reviewed the patient's current medications. Prior to Admission:  Medications Prior to Admission  Medication Sig Dispense Refill Last Dose  . ALPRAZolam (XANAX) 1 MG tablet Take 1 mg by mouth 3 (three) times daily.     04/07/2018 at Unknown time  . levothyroxine (SYNTHROID, LEVOTHROID) 50 MCG tablet TAKE 1 TABLET BY MOUTH EVERY DAY 90 tablet 0 04/07/2018 at Unknown time  . losartan (COZAAR) 100 MG tablet TAKE 1 TABLET BY MOUTH DAILY 90 tablet 0 04/07/2018 at Unknown time  . oxycodone (ROXICODONE) 30 MG immediate release tablet Take 1 tablet by mouth 5 (five) times daily as needed.   04/07/2018 at Unknown time  . mupirocin cream (BACTROBAN) 2 % Apply topically 3 (three) times daily. (Patient not taking: Reported on 11/22/2016) 15 g 0 Not Taking at Unknown time  . sildenafil (VIAGRA) 100 MG tablet Take 100 mg by mouth as needed.     0prn at prn  . triamcinolone cream (KENALOG) 0.1 % APPLY TO THE AFFECTED AREA FOUR TIMES DAILY AS NEEDED FOR ITCHING (Patient not taking: Reported on 04/07/2018) 30 g 0 Not Taking at Unknown time   Scheduled: . aspirin  81 mg Oral Daily  . levothyroxine  50 mcg Oral QAC breakfast  . losartan  100 mg Oral Daily    ROS: Unable to obtain due to altered mental sttus   Physical Examination: Blood pressure (!) 143/75,  pulse 88, temperature 99.5 F (37.5 C), temperature source Oral, resp. rate 18, height 6' (1.829 m), weight 93.9 kg, SpO2 98 %.   ? Neurological Exam  Mental Status: Alert, but confused Disoriented to time, place, and situation Attention span and concentration seemed inappropriate, mumbling to self requiring re-direction  Cranial Nerves: I. Olfactory not examined II: Visual fields were full. Pupils were equal, round and reactive to light and accommodation III,IV, VI: ptosis not present, extra-ocular motions intact bilaterally V,VII: smile symmetric, facial light touch sensation normal bilaterally VIII: Finger rub was  heard symmetric in both ears IX, X: Palate and uvular movements are normal and oral sensations are OK, gag reflex deffered XI: Neck muscle strength and shoulder shrug is normal XII: midline tongue extension  Motor Exam: Muscle strength in all extremities except right lower extremities is 5/5. No abnormal movements, fasciculations or atrophy seen  Deep Tendon Reflexes: Right Biceps is 2+, Left Biceps is 2+ Right Triceps is 2+, Left Triceps is 2+ Right Brachioradialis is 2+, Left Brachioradialis is 2+ Left Knee Jerk is 2+ Left Ankle Jerk is 2+ Left Toes are down going Right below knee amputation  Sensory Exam: Sensations were intact to light touch in all extremities  Co-ordination: Finger to nose is normal  Gait: Not tested, right below knee amputation   Data Reviewed  Laboratory Studies:   Basic Metabolic Panel: Recent Labs  Lab 04/04/18 0810 04/07/18 1740 04/07/18 1830 04/08/18 0435  NA 140 137  --  137  K 3.6 3.9  --  3.1*  CL 103 104  --  102  CO2 23 25  --  26  GLUCOSE 122* 103*  --  136*  BUN 9 14  --  12  CREATININE 0.62 0.64  --  0.63  CALCIUM 9.4 9.3  --  9.2  MG  --   --  1.7  --     Liver Function Tests: Recent Labs  Lab 04/04/18 0810 04/07/18 1740  AST 31 35  ALT 19 19  ALKPHOS 102 122  BILITOT 2.8* 3.0*  PROT 8.2* 7.8  ALBUMIN 4.9 4.8   Recent Labs  Lab 04/04/18 0810  LIPASE 22   Recent Labs  Lab 04/07/18 1830 04/08/18 0435  AMMONIA 54* 16    CBC: Recent Labs  Lab 04/04/18 0810 04/07/18 1740 04/08/18 0435  WBC 2.3* 4.8 3.1*  HGB 10.5* 10.8* 10.9*  HCT 30.9* 31.2* 30.9*  MCV 96.5 96.5 96.1  PLT 42* 52* 55*    Cardiac Enzymes: Recent Labs  Lab 04/04/18 0810 04/07/18 1830  TROPONINI <0.03 <0.03    BNP: Invalid input(s): POCBNP  CBG: No results for input(s): GLUCAP in the last 168 hours.  Microbiology: Results for orders placed or performed during the hospital encounter of 05/22/12  Culture, routine-abscess      Status: None   Collection Time: 05/22/12 12:17 PM  Result Value Ref Range Status   Specimen Description ABSCESS LEG RIGHT  Final   Special Requests BOIL ON STUMP OF RIGHT LEG  Final   Gram Stain   Final    ABUNDANT WBC PRESENT, PREDOMINANTLY PMN RARE SQUAMOUS EPITHELIAL CELLS PRESENT ABUNDANT GRAM POSITIVE COCCI IN PAIRS   Culture RARE STAPHYLOCOCCUS SPECIES (COAGULASE NEGATIVE)  Final   Report Status 05/25/2012 FINAL  Final    Coagulation Studies: No results for input(s): LABPROT, INR in the last 72 hours.  Urinalysis:  Recent Labs  Lab 04/04/18 0850 04/07/18 1830  COLORURINE AMBER* AMBER*  LABSPEC 1.021 1.024  PHURINE 5.0 5.0  GLUCOSEU NEGATIVE NEGATIVE  HGBUR SMALL* SMALL*  BILIRUBINUR NEGATIVE NEGATIVE  KETONESUR NEGATIVE 5*  PROTEINUR 100* 30*  NITRITE NEGATIVE NEGATIVE  LEUKOCYTESUR NEGATIVE NEGATIVE    Lipid Panel:     Component Value Date/Time   CHOL 103 04/08/2018 0435   TRIG 86 04/08/2018 0435   HDL 25 (L) 04/08/2018 0435   CHOLHDL 4.1 04/08/2018 0435   VLDL 17 04/08/2018 0435   LDLCALC 61 04/08/2018 0435    HgbA1C: No results found for: HGBA1C  Urine Drug Screen:  No results found for: LABOPIA, COCAINSCRNUR, LABBENZ, AMPHETMU, THCU, LABBARB  Alcohol Level: No results for input(s): ETH in the last 168 hours.  Other results: EKG: normal EKG, normal sinus rhythm, unchanged from previous tracings.  Imaging: Dg Chest 2 View  Result Date: 04/07/2018 CLINICAL DATA:  Confusion, not making sense, history cirrhosis EXAM: CHEST - 2 VIEW COMPARISON:  05/23/2006 FINDINGS: Upper normal heart size. RIGHT-side aortic arch on previous exam less well demonstrated on current study with LEFT paraspinal calcification identified at the aberrant LEFT subclavian artery as demonstrated on prior CT. Mediastinal contours and pulmonary vascularity otherwise normal. Atherosclerotic calcifications aorta. Minimal RIGHT basilar atelectasis. Lungs otherwise clear. No pleural effusion  or pneumothorax. Bones demineralized. 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 Minimal RIGHT basilar atelectasis. Electronically Signed   By: Lavonia Dana M.D.   On: 04/07/2018 19:11   Ct Head Wo Contrast  Result Date: 04/07/2018 CLINICAL DATA:  Confusion, not making sense, rambling sentences, altered behavior, history hyperlipidemia, cirrhosis EXAM: CT HEAD WITHOUT CONTRAST TECHNIQUE: Contiguous axial images were obtained from the base of the skull through the vertex without intravenous contrast. Sagittal and coronal MPR images reconstructed from axial data set. COMPARISON:  None FINDINGS: Brain: Generalized atrophy. Normal ventricular morphology. No midline shift or mass effect. Minimal small vessel chronic ischemic changes of deep cerebral white matter. No intracranial hemorrhage, mass lesion, evidence of acute infarction, or extra-axial fluid collection. Vascular: Extensive atherosclerotic calcifications of internal carotid and to lesser degree vertebral arteries at skull base Skull: Demineralized but intact Sinuses/Orbits: Clear Other: N/A IMPRESSION: Atrophy with small vessel chronic ischemic changes of deep cerebral white matter. No acute intracranial abnormalities. Electronically Signed   By: Lavonia Dana M.D.   On: 04/07/2018 19:08   US Carotid Bilateral  Result Date: 04/08/2018 CLINICAL DATA:  Stroke EXAM: BILATERAL CAROTID DUPLEX ULTRASOUND TECHNIQUE: Pearline Cables scale imaging, color Doppler and duplex ultrasound were performed of bilateral carotid and vertebral arteries in the neck. COMPARISON:  None. FINDINGS: Criteria: Quantification of carotid stenosis is based on velocity parameters that correlate the residual internal carotid diameter with NASCET-based stenosis levels, using the diameter of the distal internal carotid lumen as the denominator for stenosis measurement. The following velocity measurements were obtained: RIGHT ICA: 140/42 cm/sec CCA: 16/10  cm/sec SYSTOLIC ICA/CCA RATIO:  1.5 ECA: 106 cm/sec LEFT ICA: 104/25 cm/sec CCA: 96/04 cm/sec SYSTOLIC ICA/CCA RATIO:  1.1 ECA: 103 cm/sec RIGHT CAROTID ARTERY: Calcified plaque in the carotid bulb and proximal ICA resulting in at least mild stenosis. Normal waveforms and color Doppler signal. RIGHT VERTEBRAL ARTERY:  Normal flow direction and waveform. LEFT CAROTID ARTERY: Eccentric plaque in the common carotid artery. Calcified plaque in the bulb and proximal ICA. No high-grade stenosis. Normal waveforms and color Doppler signal. LEFT VERTEBRAL ARTERY:  Normal flow direction and waveform. IMPRESSION: 1. Bilateral carotid bifurcation and proximal ICA plaque, resulting in less than 50% diameter stenosis on the left,  50-69% diameter proximal right ICA stenosis. 2.  Antegrade bilateral vertebral arterial flow. Electronically Signed   By: Lucrezia Europe M.D.   On: 04/08/2018 11:56    Patient seen and examined.  Clinical course and management discussed.  Necessary edits performed.  I agree with the above.  Assessment and plan of care developed and discussed below.     Assessment: 67 year old male with a history of cirrhosis and chronic pain who presents for altered mental status concerning for hepatic encephalopathy. Cannot rule out possibility of stroke even though unlikely in the absence of focal neurological deficit. Pending MRI brain. Carotid ultrasound showed less than 50% stenosis on the left, and 50-69% on right ICA. Also unclear if patient has been taking pain medications as prescribed.  Labs and urine negative for UTI, leukocytosis, ammonia initially elevated at 54, given lactulose with improvement.  Chest x-ray with no infiltrate.  Head CT personally reviewed with no acute changes. Although metabolic situation is improved, mental status may lag behind improvement in the numbers.  Plan 1. MRI brain pending 2. Urine toxicology, thiamine level 3. Will continue to follow with you   This patient was  staffed with Dr. Magda Paganini, Doy Mince who personally evaluated patient, reviewed documentation and agreed with assessment and plan of care as above.  Rufina Falco, DNP, FNP-BC Board certified Nurse Practitioner Neurology Department   04/08/2018, 2:12 PM    Alexis Goodell, MD Neurology [phone]  04/08/2018  4:34 PM

## 2018-04-08 NOTE — Progress Notes (Signed)
OT Cancellation Note  Patient Details Name: [name]. MRN: [id number] DOB: 08/14/1950   Cancelled Treatment:    Reason Eval/Treat Not Completed: Other (comment). Spoke with PT. Per PT, therapy to be held at this time per nursing request secondary to pt's level of confusion.  Will re-attempt OT evaluation at later date as pt is medically appropriate.   Jeni Salles, MPH, MS, OTR/L ascom [phone] 04/08/18, 11:44 AM

## 2018-04-08 NOTE — Progress Notes (Signed)
Speech Therapy Note: received order, consulted NSG re: pt's status. NSG reported pt is "very confused" and is Edentulous. D/t being unable to see pt this late in the afternoon, diet consistency was modified to be conservative and aspiration precautions recommended also. NSG agreed.  ST services f/u w/ evaluation in the morning.    Orinda Kenner, [place], CCC-SLP

## 2018-04-08 NOTE — Progress Notes (Signed)
Patient has not urinated until now. Bladder scan revealed 19 cc. Will continue to monitor patient.

## 2018-04-08 NOTE — Progress Notes (Signed)
Pt is very confused this morning, called security to place belongings in locker. Pt had a wallet, 3 keys, $318 dollars in cash, 2 visa cards, 2 master cards, 1 EBT card and 2 ID's. Belongings were place in envelope, officer at bedside. Officer to bring locker key to Nurse. I will place the key in the chart.

## 2018-04-09 ENCOUNTER — Inpatient Hospital Stay: Payer: Medicare Other

## 2018-04-09 ENCOUNTER — Ambulatory Visit: Payer: Medicare Other | Admitting: Endocrinology

## 2018-04-09 ENCOUNTER — Inpatient Hospital Stay (HOSPITAL_COMMUNITY)
Admit: 2018-04-09 | Discharge: 2018-04-09 | Disposition: A | Discharge status: Home / Self Care | Payer: Medicare Other | Attending: Internal Medicine | Admitting: Internal Medicine

## 2018-04-09 DIAGNOSIS — Z0289 Encounter for other administrative examinations: Secondary | ICD-10-CM

## 2018-04-09 DIAGNOSIS — I10 Essential (primary) hypertension: Secondary | ICD-10-CM

## 2018-04-09 LAB — BASIC METABOLIC PANEL
ANION GAP: 9 (ref 5–15)
BUN: 14 mg/dL (ref 8–23)
CALCIUM: 9.1 mg/dL (ref 8.9–10.3)
CO2: 24 mmol/L (ref 22–32)
CREATININE: 0.46 mg/dL — AB (ref 0.61–1.24)
Chloride: 106 mmol/L (ref 98–111)
GFR calc Af Amer: >60 mL/min (ref >60)
Glucose, Bld: 122 mg/dL — ABNORMAL HIGH (ref 70–99)
Potassium: 3.5 mmol/L (ref 3.5–5.1)
SODIUM: 139 mmol/L (ref 135–145)

## 2018-04-09 LAB — HIV ANTIBODY (ROUTINE TESTING W REFLEX): HIV Screen 4th Generation wRfx: NONREACTIVE

## 2018-04-09 MED ORDER — DOCUSATE SODIUM 100 MG PO CAPS
100.0000 mg | ORAL_CAPSULE | Freq: Two times a day (BID) | ORAL | Status: DC
  Administered 2018-04-09 – 2018-04-11 (×4): 100 mg via ORAL
  Filled 2018-04-09 (×3): qty 1

## 2018-04-09 MED ORDER — CYANOCOBALAMIN 1000 MCG/ML IJ SOLN
1000.0000 ug | Freq: Once | INTRAMUSCULAR | Status: AC
  Administered 2018-04-09: 1000 ug via INTRAMUSCULAR
  Filled 2018-04-09: qty 1

## 2018-04-09 MED ORDER — OXYCODONE HCL 5 MG PO TABS
30.0000 mg | ORAL_TABLET | Freq: Every day | ORAL | Status: DC | PRN
  Administered 2018-04-09 – 2018-04-11 (×7): 30 mg via ORAL
  Filled 2018-04-09 (×7): qty 6

## 2018-04-09 MED ORDER — LORAZEPAM 2 MG/ML IJ SOLN
3.0000 mg | Freq: Once | INTRAMUSCULAR | Status: AC
  Administered 2018-04-09: 10:00:00 3 mg via INTRAVENOUS
  Filled 2018-04-09: qty 2

## 2018-04-09 MED ORDER — VITAMIN B-12 1000 MCG PO TABS: 1000.0000 ug | ORAL_TABLET | Freq: Every day | ORAL | Status: DC

## 2018-04-09 MED ORDER — SENNA 8.6 MG PO TABS
1.0000 | ORAL_TABLET | Freq: Every day | ORAL | Status: DC
  Administered 2018-04-10: 8.6 mg via ORAL
  Filled 2018-04-09: qty 1

## 2018-04-09 MED ORDER — QUETIAPINE FUMARATE 25 MG PO TABS
25.0000 mg | ORAL_TABLET | Freq: Once | ORAL | Status: AC
  Administered 2018-04-09: 01:00:00 25 mg via ORAL
  Filled 2018-04-09: qty 1

## 2018-04-09 NOTE — Progress Notes (Signed)
*  PRELIMINARY RESULTS* Echocardiogram 2D Echocardiogram has been performed.  [name] Headings 04/09/2018, 9:13 AM

## 2018-04-09 NOTE — Progress Notes (Signed)
Went into pt room on to round and found pt sitting on side of bed with only a blanket over him. Feces was all over the room. All sheets, including bottom sheet were off the bed and in the floor. Asked pt what had happened. He stated "All kinds of stuff happened. I was asleep and having dreams of things. I want my pain medicine and Xanax!" Pt received medications, a bath, and linen change.

## 2018-04-09 NOTE — Discharge Summary (Signed)
[facility] NAME: [name]    MR#:  [id number]  DATE OF BIRTH:  [birth year]  DATE OF ADMISSION:  04/07/2018 ADMITTING PHYSICIAN: [name], MD  DATE OF DISCHARGE: 04/09/2018  PRIMARY CARE PHYSICIAN: Renato Shin, MD    ADMISSION DIAGNOSIS:  Hepatic encephalopathy ([place]) [K72.90]  DISCHARGE DIAGNOSIS:  Principal Problem:   Acute hepatic encephalopathy Active Problems:   Dyslipidemia   Anxiety   Hepatic cirrhosis (HCC)   Hypothyroidism   Hepatic encephalopathy ([place])   SECONDARY DIAGNOSIS:   Past Medical History:  Diagnosis Date  . 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 ANXIETY 03/24/2007  . Chronic pain   . CIRRHOSIS 03/24/2007  . 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 FRACTURE, WRIST, LEFT 10/12/2009   Qualifier: Diagnosis of  By: Loanne Drilling MD, Jacelyn Pi   . HEPATITIS C 03/24/2007  . HYPERLIPIDEMIA 09/12/2009  . Hypocalcemia 12/23/2007   Qualifier: Diagnosis of  By: Loanne Drilling MD, Jacelyn Pi   . Pancytopenia 12/23/2007  . 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 UROLITHIASIS, HX OF 12/23/2007    HOSPITAL COURSE:  67 year old male with history of hepatitis C and liver cirrhosis who presents with encephalopathy.  1.  Acute encephalopathy: Patient had a combination of hepatic encephalopathy due to elevated ammonia level as well as pain medication induced.  He underwent CVA work-up including MRI, echo and carotid Doppler essentially all of these were unremarkable.  He is back to his baseline.  I am suspecting a combination of mild hepatic encephalopathy and opioid/pain medication and reduce encephalopathy.  2.  Liver cirrhosis with hepatitis C: Patient has outpatient follow-up at [facility]  3.  Hypokalemia: This was repleted  4.  Hypothyroidism: Continue Synthroid. TSH was within normal limits  5.  Essential hypertension: Continue losartan  6.  Thrombocytopenia: This is chronic in  nature and due to liver cirrhosis  7.  B12 deficiency: Patient will need oral B12 supplements DISCHARGE CONDITIONS AND DIET:   Stable Cardiac diet  CONSULTS OBTAINED:  Treatment Team:  Alexis Goodell, MD  DRUG ALLERGIES:  No Known Allergies  DISCHARGE MEDICATIONS:   Allergies as of 04/09/2018   No Known Allergies     Medication List    STOP taking these medications   mupirocin cream 2 % Commonly known as:  BACTROBAN   triamcinolone cream 0.1 % Commonly known as:  KENALOG     TAKE these medications   ALPRAZolam 1 MG tablet Commonly known as:  XANAX Take 1 mg by mouth 3 (three) times daily.   levothyroxine 50 MCG tablet Commonly known as:  SYNTHROID, LEVOTHROID TAKE 1 TABLET BY MOUTH EVERY DAY   losartan 100 MG tablet Commonly known as:  COZAAR TAKE 1 TABLET BY MOUTH DAILY   oxycodone 30 MG immediate release tablet Commonly known as:  ROXICODONE Take 1 tablet by mouth 5 (five) times daily as needed.   sildenafil 100 MG tablet Commonly known as:  VIAGRA Take 100 mg by mouth as needed.   vitamin B-12 1000 MCG tablet Commonly known as:  CYANOCOBALAMIN Take 1 tablet (1,000 mcg total) by mouth daily.         Today   CHIEF COMPLAINT:  Patient is back to baseline this am Son at bedside   VITAL SIGNS:  Blood pressure (!) 144/71, pulse 81, temperature 98.1 F (36.7 C), temperature source Oral, resp. rate 18, height 6' (1.829 m), weight  93.9 kg, SpO2 100 %.   REVIEW OF SYSTEMS:  Review of Systems  Constitutional: Negative.  Negative for chills, fever and malaise/fatigue.  HENT: Negative.  Negative for ear discharge, ear pain, hearing loss, nosebleeds and sore throat.   Eyes: Negative.  Negative for blurred vision and pain.  Respiratory: Negative.  Negative for cough, hemoptysis, shortness of breath and wheezing.   Cardiovascular: Negative.  Negative for chest pain, palpitations and leg swelling.  Gastrointestinal: Negative.  Negative for abdominal  pain, blood in stool, diarrhea, nausea and vomiting.  Genitourinary: Negative.  Negative for dysuria.  Musculoskeletal: Negative.  Negative for back pain.  Skin: Negative.   Neurological: Negative for dizziness, tremors, speech change, focal weakness, seizures and headaches.  Endo/Heme/Allergies: Negative.  Does not bruise/bleed easily.  Psychiatric/Behavioral: Negative.  Negative for depression, hallucinations and suicidal ideas.     PHYSICAL EXAMINATION:  GENERAL:  67 y.o.-year-old patient lying in the bed with no acute distress.  NECK:  Supple, no jugular venous distention. No thyroid enlargement, no tenderness.  LUNGS: Normal breath sounds bilaterally, no wheezing, rales,rhonchi  No use of accessory muscles of respiration.  CARDIOVASCULAR: S1, S2 normal. No murmurs, rubs, or gallops.  ABDOMEN: Soft, non-tender, non-distended. Bowel sounds present. No organomegaly or mass.  EXTREMITIES: No pedal edema, cyanosis, or clubbing. Left AKA PSYCHIATRIC: The patient is alert and oriented x 3.  SKIN: No obvious rash, lesion, or ulcer.   DATA REVIEW:   CBC Recent Labs  Lab 04/08/18 0435  WBC 3.1*  HGB 10.9*  HCT 30.9*  PLT 55*    Chemistries  Recent Labs  Lab 04/07/18 1740 04/07/18 1830  04/09/18 0817  NA 137  --    < > 139  K 3.9  --    < > 3.5  CL 104  --    < > 106  CO2 25  --    < > 24  GLUCOSE 103*  --    < > 122*  BUN 14  --    < > 14  CREATININE 0.64  --    < > 0.46*  CALCIUM 9.3  --    < > 9.1  MG  --  1.7  --   --   AST 35  --   --   --   ALT 19  --   --   --   ALKPHOS 122  --   --   --   BILITOT 3.0*  --   --   --    < > = values in this interval not displayed.    Cardiac Enzymes Recent Labs  Lab 04/04/18 0810 04/07/18 1830  TROPONINI <0.03 <0.03    Microbiology Results  @[email or web address] @  RADIOLOGY:  Dg Chest 2 View  Result Date: 04/07/2018 CLINICAL DATA:  Confusion, not making sense, history cirrhosis EXAM: CHEST - 2 VIEW COMPARISON:  05/23/2006  FINDINGS: Upper normal heart size. RIGHT-side aortic arch on previous exam less well demonstrated on current study with LEFT paraspinal calcification identified at the aberrant LEFT subclavian artery as demonstrated on prior CT. Mediastinal contours and pulmonary vascularity otherwise normal. Atherosclerotic calcifications aorta. Minimal RIGHT basilar atelectasis. Lungs otherwise clear. No pleural effusion or pneumothorax. Bones demineralized. 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 Minimal RIGHT basilar atelectasis. Electronically Signed   By: Lavonia Dana M.D.   On: 04/07/2018 19:11   Ct Head Wo Contrast  Result Date: 04/07/2018 CLINICAL DATA:  Confusion, not making sense,  rambling sentences, altered behavior, history hyperlipidemia, cirrhosis EXAM: CT HEAD WITHOUT CONTRAST TECHNIQUE: Contiguous axial images were obtained from the base of the skull through the vertex without intravenous contrast. Sagittal and coronal MPR images reconstructed from axial data set. COMPARISON:  None FINDINGS: Brain: Generalized atrophy. Normal ventricular morphology. No midline shift or mass effect. Minimal small vessel chronic ischemic changes of deep cerebral white matter. No intracranial hemorrhage, mass lesion, evidence of acute infarction, or extra-axial fluid collection. Vascular: Extensive atherosclerotic calcifications of internal carotid and to lesser degree vertebral arteries at skull base Skull: Demineralized but intact Sinuses/Orbits: Clear Other: N/A IMPRESSION: Atrophy with small vessel chronic ischemic changes of deep cerebral white matter. No acute intracranial abnormalities. Electronically Signed   By: Lavonia Dana M.D.   On: 04/07/2018 19:08   Mr Brain Wo Contrast  Result Date: 04/09/2018 CLINICAL DATA:  Confusion.  Mental status changes. EXAM: MRI HEAD WITHOUT CONTRAST TECHNIQUE: Multiplanar, multiecho pulse sequences of the brain and surrounding structures were  obtained without intravenous contrast. COMPARISON:  Head CT 04/07/2018 FINDINGS: Brain: The study suffers from motion degradation but is sufficiently diagnostic. Diffusion imaging does not show any acute or subacute infarction. The brainstem and cerebellum are normal. Cerebral hemispheres show mild generalized atrophy with mild chronic small-vessel change of the deep white matter. 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 No large vessel territory infarction. No mass lesion, hemorrhage, hydrocephalus or extra-axial collection. Vascular: Major vessels at the base of the brain show flow. Skull and upper cervical spine: Negative Sinuses/Orbits: Clear. No acute orbital finding. Previous lens implant on the left. Other: None IMPRESSION: No acute finding by MRI. Generalized atrophy. Mild chronic small-vessel change of the white matter. 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 Electronically Signed   By: Nelson Chimes M.D.   On: 04/09/2018 11:48   US Carotid Bilateral  Result Date: 04/08/2018 CLINICAL DATA:  Stroke EXAM: BILATERAL CAROTID DUPLEX ULTRASOUND TECHNIQUE: Pearline Cables scale imaging, color Doppler and duplex ultrasound were performed of bilateral carotid and vertebral arteries in the neck. COMPARISON:  None. FINDINGS: Criteria: Quantification of carotid stenosis is based on velocity parameters that correlate the residual internal carotid diameter with NASCET-based stenosis levels, using the diameter of the distal internal carotid lumen as the denominator for stenosis measurement. The following velocity measurements were obtained: RIGHT ICA: 140/42 cm/sec CCA: 66/44 cm/sec SYSTOLIC ICA/CCA RATIO:  1.5 ECA: 106 cm/sec LEFT ICA: 104/25 cm/sec CCA: 03/47 cm/sec SYSTOLIC ICA/CCA RATIO:  1.1 ECA: 103 cm/sec RIGHT CAROTID ARTERY: Calcified plaque in the carotid bulb and proximal ICA resulting  in at least mild stenosis. Normal waveforms and color Doppler signal. RIGHT VERTEBRAL ARTERY:  Normal flow direction and waveform. LEFT CAROTID ARTERY: Eccentric plaque in the common carotid artery. Calcified plaque in the bulb and proximal ICA. No high-grade stenosis. Normal waveforms and color Doppler signal. LEFT VERTEBRAL ARTERY:  Normal flow direction and waveform. IMPRESSION: 1. Bilateral carotid bifurcation and proximal ICA plaque, resulting in less than 50% diameter stenosis on the left, 50-69% diameter proximal right ICA stenosis. 2.  Antegrade bilateral vertebral arterial flow. Electronically Signed   By: Lucrezia Europe M.D.   On: 04/08/2018 11:56      Allergies as of 04/09/2018   No Known Allergies     Medication List    STOP taking these medications   mupirocin cream 2 % Commonly known as:  [employer]  triamcinolone cream 0.1 % Commonly known as:  KENALOG     TAKE these medications   ALPRAZolam 1 MG tablet Commonly known as:  XANAX Take 1 mg by mouth 3 (three) times daily.   levothyroxine 50 MCG tablet Commonly known as:  SYNTHROID, LEVOTHROID TAKE 1 TABLET BY MOUTH EVERY DAY   losartan 100 MG tablet Commonly known as:  COZAAR TAKE 1 TABLET BY MOUTH DAILY   oxycodone 30 MG immediate release tablet Commonly known as:  ROXICODONE Take 1 tablet by mouth 5 (five) times daily as needed.   sildenafil 100 MG tablet Commonly known as:  VIAGRA Take 100 mg by mouth as needed.   vitamin B-12 1000 MCG tablet Commonly known as:  CYANOCOBALAMIN Take 1 tablet (1,000 mcg total) by mouth daily.        }   Management plans discussed with the patient and he is in agreement. Stable for discharge   Patient should follow up with pcp  CODE STATUS:     Code Status Orders  (From admission, onward)         Start     Ordered   04/07/18 2207  Full code  Continuous     04/07/18 2206        Code Status History    This patient has a current code status but no historical  code status.      TOTAL TIME TAKING CARE OF THIS PATIENT: 38 minutes.    Note: This dictation was prepared with Dragon dictation along with smaller phrase technology. Any transcriptional errors that result from this process are unintentional.  Emsley Custer M.D on 04/09/2018 at 12:07 PM  Between 7am to 6pm - Pager - [phone] After 6pm go to [email or web address] - password [email or web address] [facility]  Office  [phone]  CC: Primary care physician; Renato Shin, MD

## 2018-04-09 NOTE — Evaluation (Addendum)
Clinical/Bedside Swallow Evaluation Patient Details  Name: [name] [name]. MRN: [id number] Date of Birth: [birth year]  Today's Date: 04/09/2018 Time: SLP Start Time (ACUTE ONLY): 68 SLP Stop Time (ACUTE ONLY): 1015 SLP Time Calculation (min) (ACUTE ONLY): 60 min  Past Medical History:  Past Medical History:  Diagnosis Date  . 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 ANXIETY 03/24/2007  . Chronic pain   . CIRRHOSIS 03/24/2007  . 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 FRACTURE, WRIST, LEFT 10/12/2009   Qualifier: Diagnosis of  By: Loanne Drilling MD, Jacelyn Pi   . HEPATITIS C 03/24/2007  . HYPERLIPIDEMIA 09/12/2009  . Hypocalcemia 12/23/2007   Qualifier: Diagnosis of  By: Loanne Drilling MD, Jacelyn Pi   . Pancytopenia 12/23/2007  . 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 UROLITHIASIS, HX OF 12/23/2007   Past Surgical History:  Past Surgical History:  Procedure Laterality Date  . ABOVE KNEE LEG AMPUTATION Right    HPI:  Pt is a 67 y.o. male who presents with chief complaint acute encephalopathy.  Urine toxicology positive for opiods and tricyclics.  Patient is confused, and his contribution to the information in the HPI is partial and only moderately reliable at best.  He does seem to have some sense of his current condition, stating that he understands that he is confused and "does not feel like himself".  His brother is at bedside and contributes to the history.  His brother states that he spoke with the patient on Saturday and Sunday and the patient seemed okay, but that today when the "cleaning lady" went to his house she told him that he did not seem to be acting like himself, and she called the brother.  The brother then stated that he went to the patient's house and found him significantly confused.  They then brought him to the ED for evaluation.  Here he was found to have an elevated ammonia level.  Patient if being followed at [facility] for some hepatitis C study  being performed there. Patient is less confused per Son present this morning but endorsed some confusion still.  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    Assessment / Plan / Recommendation Clinical Impression  Pt appears to present w/ fairly adequate oropharyngeal phase swallowing function w/ only slight increased oral phase time for mastication effort and efficiency d/t missing dentition - pt is not wearing his partial plate. Pt appears at reduced risk for aspiration when following general aspiration precautions. Pt required min setup and positioning assistance then fed self trials of thin liquids via Cup and purees/soft solids w/ no overt s/s of aspiration noted; clear vocal quality noted b/t trials and no decline in respiratory status post trials. Oral phase c/b min increased masticaiton time/effort for increased textured foods but pt was able to effectively chew, swallow, and clear all trials he fed self. Min assistance given during feeding as he tended to lean to his Left side and not use his Left hand to support feeding self as much/intermittently. Pt's Cognitive status is declined at this time as noted in his verbal engagement w/ Son and SLP. Unsure of pt's baseline Cognitive status - see MRI results of old infarcts in frontal lobes. Recommend a Mech Soft diet w/ thin liquids  w/ general aspiration precautions. Pills Whole in Puree for easier swallowing as needed. Tray setup at meals. Son denied any swallowing issues as did pt. Recommend ST f/u for formal Cognitive assessment if pt is returning home alone to address awareness of safety issues, responses. Son updated on recommendation for Supervision if returning home alone. CM/NSG updated.  SLP Visit Diagnosis: Dysphagia, oral  phase (R13.11)(missing dentition impacting efficiency)    Aspiration Risk  (reduced following general aspiration precautions)    Diet Recommendation  Dysphagia level 3 (mech soft) w/ Thin liquids; aspiration precautions  Medication Administration: Whole meds with puree(for safer swallowing)    Other  Recommendations Recommended Consults: (Neurology f/u) Oral Care Recommendations: Oral care BID;Patient independent with oral care;Staff/trained caregiver to provide oral care Other Recommendations: (n/a)   Follow up Recommendations None      Frequency and Duration (n/a)  (n/a)       Prognosis Prognosis for Safe Diet Advancement: Fair(-Good) Barriers to Reach Goals: Cognitive deficits      Swallow Study   General Date of Onset: 04/07/18 HPI: Pt is a 67 y.o. male who presents with chief complaint acute encephalopathy.  Patient is confused, and his contribution to the information in the HPI is partial and only moderately reliable at best.  He does seem to have some sense of his current condition, stating that he understands that he is confused and "does not feel like himself".  His brother is at bedside and contributes to the history.  His brother states that he spoke with the patient on Saturday and Sunday and the patient seemed okay, but that today when the "cleaning lady" went to his house she told him that he did not seem to be acting like himself, and she called the brother.  The brother then stated that he went to the patient's house and found him significantly confused.  They then brought him to the ED for evaluation.  Here he was found to have an elevated ammonia level.  Patient if being followed at [facility] for some hepatitis C study being performed there. Patient is less confused per Son present this morning but endorsed some confusion still.  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  Type of Study: Bedside Swallow Evaluation Previous Swallow Assessment: none Diet Prior to this Study: Regular;Thin liquids Temperature Spikes Noted: No(wbc 3.1) Respiratory Status: Room air History of Recent Intubation: No Behavior/Cognition: Alert;Cooperative;Pleasant mood;Distractible;Requires cueing;Confused Oral Cavity Assessment: Within Functional Limits Oral Care Completed by SLP: Recent completion by staff Oral Cavity - Dentition: Missing dentition(wears an upper partial - but not in place) Vision: Functional for self-feeding Self-Feeding Abilities: Able to feed self;Needs assist;Needs set up Patient Positioning: Upright in bed(needed more positioning) Baseline Vocal Quality: Normal Volitional Cough: Strong Volitional Swallow: Able to elicit    Oral/Motor/Sensory Function Overall Oral Motor/Sensory Function: Within functional limits(grossly appeared w/ bolus management and clearing)   Ice Chips Ice chips: Not tested   Thin Liquid Thin Liquid: Within functional limits Presentation: Cup;Self Fed(~6+ ozs)    Nectar Thick Nectar Thick Liquid: Not tested   Honey Thick Honey Thick Liquid: Not tested   Puree Puree: Within functional limits Presentation: Spoon;Self Fed(~8+ ozs)   Solid     Solid: Impaired Presentation: Self Fed;Spoon(8-9+ bites) Oral Phase Impairments: Impaired mastication(increased time needed) Oral Phase Functional Implications:  Impaired mastication(missing dentition) Pharyngeal Phase Impairments: (none)      Orinda Kenner, MS, CCC-SLP Watson,Katherine 04/09/2018,1:18 PM

## 2018-04-09 NOTE — Progress Notes (Signed)
OT Cancellation Note  Patient Details Name: [name] [name]. MRN: [id number] DOB: 04/08/1951   Cancelled Treatment:    Reason Eval/Treat Not Completed: Other (comment). Chart reviewed. Spoke with RN. Pt sleeping now, had a rough night with staff. Pt not appropriate for OT evaluation this morning. Will re-attempt this afternoon as pt is medically appropriate and as schedule permits.   Jeni Salles, MPH, MS, OTR/L ascom [phone] 04/09/18, 8:08 AM

## 2018-04-09 NOTE — Progress Notes (Addendum)
[facility] NAME: [name]    MR#:  [id number]  DATE OF BIRTH:  Oct 22, 1950  SUBJECTIVE:   Patient much improved this am  REVIEW OF SYSTEMS:    Review of Systems  Constitutional: Negative.  Negative for chills, fever and malaise/fatigue.  HENT: Negative.  Negative for ear discharge, ear pain, hearing loss, nosebleeds and sore throat.   Eyes: Negative.  Negative for blurred vision and pain.  Respiratory: Negative.  Negative for cough, hemoptysis, shortness of breath and wheezing.   Cardiovascular: Negative.  Negative for chest pain, palpitations and leg swelling.  Gastrointestinal: Negative.  Negative for abdominal pain, blood in stool, diarrhea, nausea and vomiting.  Genitourinary: Negative.  Negative for dysuria.  Musculoskeletal: Negative.  Negative for back pain.  Skin: Negative.   Neurological: Negative for dizziness, tremors, speech change, focal weakness, seizures and headaches.  Endo/Heme/Allergies: Negative.  Does not bruise/bleed easily.  Psychiatric/Behavioral: Negative.  Negative for depression, hallucinations and suicidal ideas.      Tolerating Diet: yes      DRUG ALLERGIES:  No Known Allergies  VITALS:  Blood pressure (!) 144/71, pulse 81, temperature 98.1 F (36.7 C), temperature source Oral, resp. rate 18, height 6' (1.829 m), weight 93.9 kg, SpO2 100 %.  PHYSICAL EXAMINATION:  Constitutional: Appears well-developed and well-nourished. No distress. HENT: Normocephalic. Marland Kitchen Oropharynx is clear and moist.  Eyes: Conjunctivae and EOM are normal. PERRLA, no scleral icterus.  Neck: Normal ROM. Neck supple. No JVD. No tracheal deviation. CVS: RRR, S1/S2 +, no murmurs, no gallops, no carotid bruit.  Pulmonary: Effort and breath sounds normal, no stridor, rhonchi, wheezes, rales.  Abdominal: Soft. BS +,  no distension, tenderness, rebound or guarding.  Musculoskeletal: Normal range of motion. No edema and  no tenderness. riggt AKA Neuro: Alert. CN 2-12 grossly intact. No focal deficits. Patient may be a phasic and confused Skin: Skin is warm and dry. No rash noted. Psychiatric: Confused     LABORATORY PANEL:   CBC Recent Labs  Lab 04/08/18 0435  WBC 3.1*  HGB 10.9*  HCT 30.9*  PLT 55*   ------------------------------------------------------------------------------------------------------------------  Chemistries  Recent Labs  Lab 04/07/18 1740 04/07/18 1830  04/09/18 0817  NA 137  --    < > 139  K 3.9  --    < > 3.5  CL 104  --    < > 106  CO2 25  --    < > 24  GLUCOSE 103*  --    < > 122*  BUN 14  --    < > 14  CREATININE 0.64  --    < > 0.46*  CALCIUM 9.3  --    < > 9.1  MG  --  1.7  --   --   AST 35  --   --   --   ALT 19  --   --   --   ALKPHOS 122  --   --   --   BILITOT 3.0*  --   --   --    < > = values in this interval not displayed.   ------------------------------------------------------------------------------------------------------------------  Cardiac Enzymes Recent Labs  Lab 04/04/18 0810 04/07/18 1830  TROPONINI <0.03 <0.03   ------------------------------------------------------------------------------------------------------------------  RADIOLOGY:  Dg Chest 2 View  Result Date: 04/07/2018 CLINICAL DATA:  Confusion, not making sense, history cirrhosis EXAM: CHEST - 2 VIEW COMPARISON:  05/23/2006 FINDINGS: Upper normal heart size. RIGHT-side aortic arch  on previous exam less well demonstrated on current study with LEFT paraspinal calcification identified at the aberrant LEFT subclavian artery as demonstrated on prior CT. Mediastinal contours and pulmonary vascularity otherwise normal. Atherosclerotic calcifications aorta. Minimal RIGHT basilar atelectasis. Lungs otherwise clear. No pleural effusion or pneumothorax. Bones demineralized. 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 Minimal RIGHT basilar  atelectasis. Electronically Signed   By: Lavonia Dana M.D.   On: 04/07/2018 19:11   Ct Head Wo Contrast  Result Date: 04/07/2018 CLINICAL DATA:  Confusion, not making sense, rambling sentences, altered behavior, history hyperlipidemia, cirrhosis EXAM: CT HEAD WITHOUT CONTRAST TECHNIQUE: Contiguous axial images were obtained from the base of the skull through the vertex without intravenous contrast. Sagittal and coronal MPR images reconstructed from axial data set. COMPARISON:  None FINDINGS: Brain: Generalized atrophy. Normal ventricular morphology. No midline shift or mass effect. Minimal small vessel chronic ischemic changes of deep cerebral white matter. No intracranial hemorrhage, mass lesion, evidence of acute infarction, or extra-axial fluid collection. Vascular: Extensive atherosclerotic calcifications of internal carotid and to lesser degree vertebral arteries at skull base Skull: Demineralized but intact Sinuses/Orbits: Clear Other: N/A IMPRESSION: Atrophy with small vessel chronic ischemic changes of deep cerebral white matter. No acute intracranial abnormalities. Electronically Signed   By: Lavonia Dana M.D.   On: 04/07/2018 19:08   Mr Brain Wo Contrast  Result Date: 04/09/2018 CLINICAL DATA:  Confusion.  Mental status changes. EXAM: MRI HEAD WITHOUT CONTRAST TECHNIQUE: Multiplanar, multiecho pulse sequences of the brain and surrounding structures were obtained without intravenous contrast. COMPARISON:  Head CT 04/07/2018 FINDINGS: Brain: The study suffers from motion degradation but is sufficiently diagnostic. Diffusion imaging does not show any acute or subacute infarction. The brainstem and cerebellum are normal. Cerebral hemispheres show mild generalized atrophy with mild chronic small-vessel change of the deep white matter. 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 No large vessel territory  infarction. No mass lesion, hemorrhage, hydrocephalus or extra-axial collection. Vascular: Major vessels at the base of the brain show flow. Skull and upper cervical spine: Negative Sinuses/Orbits: Clear. No acute orbital finding. Previous lens implant on the left. Other: None IMPRESSION: No acute finding by MRI. Generalized atrophy. Mild chronic small-vessel change of the white matter. 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 Electronically Signed   By: Nelson Chimes M.D.   On: 04/09/2018 11:48   US Carotid Bilateral  Result Date: 04/08/2018 CLINICAL DATA:  Stroke EXAM: BILATERAL CAROTID DUPLEX ULTRASOUND TECHNIQUE: Pearline Cables scale imaging, color Doppler and duplex ultrasound were performed of bilateral carotid and vertebral arteries in the neck. COMPARISON:  None. FINDINGS: Criteria: Quantification of carotid stenosis is based on velocity parameters that correlate the residual internal carotid diameter with NASCET-based stenosis levels, using the diameter of the distal internal carotid lumen as the denominator for stenosis measurement. The following velocity measurements were obtained: RIGHT ICA: 140/42 cm/sec CCA: 24/40 cm/sec SYSTOLIC ICA/CCA RATIO:  1.5 ECA: 106 cm/sec LEFT ICA: 104/25 cm/sec CCA: 10/27 cm/sec SYSTOLIC ICA/CCA RATIO:  1.1 ECA: 103 cm/sec RIGHT CAROTID ARTERY: Calcified plaque in the carotid bulb and proximal ICA resulting in at least mild stenosis. Normal waveforms and color Doppler signal. RIGHT VERTEBRAL ARTERY:  Normal flow direction and waveform. LEFT CAROTID ARTERY: Eccentric plaque in the common carotid artery. Calcified plaque in the bulb and proximal ICA. No high-grade stenosis. Normal waveforms and color Doppler  signal. LEFT VERTEBRAL ARTERY:  Normal flow direction and waveform. IMPRESSION: 1. Bilateral carotid bifurcation and proximal ICA plaque, resulting in less than 50% diameter stenosis on the left, 50-69% diameter proximal right ICA stenosis. 2.   Antegrade bilateral vertebral arterial flow. Electronically Signed   By: Lucrezia Europe M.D.   On: 04/08/2018 11:56     ASSESSMENT AND PLAN:   67 year old male with history of hepatitis C and liver cirrhosis who presents with encephalopathy.  1. Acute encephalopathy: Patient had a combination of hepatic encephalopathy due to elevated ammonia level as well as pain medication induced.  He underwent CVA work-up including MRI, echo and carotid Doppler essentially all of these were unremarkable.  He is back to his baseline.  I am suspecting a combination of mild hepatic encephalopathy and opioid/pain medication and reduce encephalopathy.  2. Liver cirrhosis with hepatitis C: Patient has outpatient follow-up at [facility]  3. Hypokalemia: This was repleted  4. Hypothyroidism: Continue Synthroid. TSH was within normal limits  5. Essential hypertension: Continue losartan  6. Thrombocytopenia: This is chronic in nature and due to liver cirrhosis  7.  B12 deficiency: Patient will need oral B12 supplements  Management plans discussed with nursing  CODE STATUS: FULL  TOTAL TIME TAKING CARE OF THIS PATIENT: 6 minutes.  D/w patient's son   POSSIBLE D/C today, DEPENDING ON CLINICAL CONDITION.   Kassius Battiste M.D on 04/09/2018 at 12:58 PM  Between 7am to 6pm - Pager - [phone] After 6pm go to [email or web address] - password [email or web address] [facility]  Office  [phone]  CC: Primary care physician; Renato Shin, MD  Note: This dictation was prepared with Dragon dictation along with smaller phrase technology. Any transcriptional errors that result from this process are unintentional.

## 2018-04-09 NOTE — Progress Notes (Signed)
PT Cancellation Note  Patient Details Name: [name]. MRN: [id number] DOB: [birth year]   Cancelled Treatment:    Reason Eval/Treat Not Completed: Patient's level of consciousness.  PT evaluation held this PM per nursing request secondary to pt presenting with increased confusion.  Will attempt PT evaluation at another date/time as medically appropriate.     Linus Salmons PT, DPT 04/09/18, 3:59 PM

## 2018-04-09 NOTE — Progress Notes (Signed)
OT Cancellation Note  Patient Details Name: [name]. MRN: [id number] DOB: [birth year]   Cancelled Treatment:    Reason Eval/Treat Not Completed: Patient's level of consciousness. OT evaluation held this PM per nursing request secondary to pt presenting with increased confusion.  Will attempt OT evaluation at another date/time as medically appropriate.    Jeni Salles, MPH, MS, OTR/L ascom [phone] 04/09/18, 4:17 PM

## 2018-04-09 NOTE — Progress Notes (Signed)
Security code called for second time as pt is trying to get out of bed and cannot be redirected. Once officer came in room, pt laid back down in bed. Pt talking rapidly and nonsensical. Unable to reorient.  Spoke with Dr Jannifer Franklin and  Sequel ordered and given. Dorna Bloom RN

## 2018-04-09 NOTE — Progress Notes (Signed)
Subjective: Per nursing staff, security code called twice at around 12:30 am as patient was trying to get out of bed and cannot be redirected. Once officer came in room,he was able to calm down but still talking rapidly and nonsensical. Unable to reorient. He was given Seroquel once with improvement. He is awake and alert this morning with son at bedside. He is oriented x3 and was able to converse appropriately without any difficulty. Per son, his mental status seemed much better than previous when he saw him.  Objective: Current vital signs: BP (!) 144/71   Pulse 81   Temp 98.1 F (36.7 C) (Oral)   Resp 18   Ht 6' (1.829 m)   Wt 93.9 kg   SpO2 100%   BMI 28.07 kg/m  Vital signs in last 24 hours: Temp:  [97.7 F (36.5 C)-98.2 F (36.8 C)] 98.1 F (36.7 C) (09/11 1022) Pulse Rate:  [81-102] 81 (09/11 1022) Resp:  [18] 18 (09/10 1921) BP: (127-162)/(71-92) 144/71 (09/11 1022) SpO2:  [97 %-100 %] 100 % (09/11 1022)  Intake/Output from previous day: 09/10 0701 - 09/11 0700 In: -  Out: 250 [Urine:250] Intake/Output this shift: No intake/output data recorded. Nutritional status:  Diet Order            DIET DYS 3 Room service appropriate? Yes with Assist; Fluid consistency: Thin  Diet effective now             Physical Exam   Vitals Blood pressure (!) 144/71, pulse 81, temperature 98.1 F (36.7 C), temperature source Oral, resp. rate 18, height 6' (1.829 m), weight 93.9 kg, SpO2 100 %.  Neurological Exam   Mental Status: Alert, oriented to person, place and situation. Able to identify son by name and relationship, knew his exact birth date.  Speech fluent without evidence of aphasia.  Able to follow 2 step commands with difficulty.  He required re-direction. Attention span and concentration seemed mildly inappropriate, goes off topic Cranial Nerves: II: Discs flat bilaterally; Visual fields grossly normal, pupils equal, round, reactive to light and accommodation III,IV,  VI: ptosis not present, extra-ocular motions intact bilaterally V,VII: smile symmetric, facial light touch sensation intact VIII: hearing normal bilaterally IX,X: gag reflex present XI: bilateral shoulder shrug XII: midline tongue extension Motor: Right :  Upper extremity   5-/5 without pronator drift      Left: Upper extremity   5/5 without pronator drift Right:   Below knee amputation                                        Left: Lower extremity   5/5 Tone and bulk:normal tone throughout; no atrophy noted Sensory: Pinprick and light touch intact throughout Deep Tendon Reflexes: 2+ and symmetric throughout Plantars: Right: Below know amputation                           Left: mute Cerebellar: Finger-to-nose testing intact bilaterally.  Gait: not tested due to safety as patient with right below knee amputation  Lab Results: Basic Metabolic Panel: Recent Labs  Lab 04/04/18 0810 04/07/18 1740 04/07/18 1830 04/08/18 0435 04/09/18 0817  NA 140 137  --  137 139  K 3.6 3.9  --  3.1* 3.5  CL 103 104  --  102 106  CO2 23 25  --  26 24  GLUCOSE 122*  103*  --  136* 122*  BUN 9 14  --  12 14  CREATININE 0.62 0.64  --  0.63 0.46*  CALCIUM 9.4 9.3  --  9.2 9.1  MG  --   --  1.7  --   --     Liver Function Tests: Recent Labs  Lab 04/04/18 0810 04/07/18 1740  AST 31 35  ALT 19 19  ALKPHOS 102 122  BILITOT 2.8* 3.0*  PROT 8.2* 7.8  ALBUMIN 4.9 4.8   Recent Labs  Lab 04/04/18 0810  LIPASE 22   Recent Labs  Lab 04/07/18 1830 04/08/18 0435  AMMONIA 54* 16    CBC: Recent Labs  Lab 04/04/18 0810 04/07/18 1740 04/08/18 0435  WBC 2.3* 4.8 3.1*  HGB 10.5* 10.8* 10.9*  HCT 30.9* 31.2* 30.9*  MCV 96.5 96.5 96.1  PLT 42* 52* 55*    Cardiac Enzymes: Recent Labs  Lab 04/04/18 0810 04/07/18 1830  TROPONINI <0.03 <0.03    Lipid Panel: Recent Labs  Lab 04/08/18 0435  CHOL 103  TRIG 86  HDL 25*  CHOLHDL 4.1  VLDL 17  LDLCALC 61    CBG: No results for  input(s): GLUCAP in the last 168 hours.  Microbiology: Results for orders placed or performed during the hospital encounter of 05/22/12  Culture, routine-abscess     Status: None   Collection Time: 05/22/12 12:17 PM  Result Value Ref Range Status   Specimen Description ABSCESS LEG RIGHT  Final   Special Requests BOIL ON STUMP OF RIGHT LEG  Final   Gram Stain   Final    ABUNDANT WBC PRESENT, PREDOMINANTLY PMN RARE SQUAMOUS EPITHELIAL CELLS PRESENT ABUNDANT GRAM POSITIVE COCCI IN PAIRS   Culture RARE STAPHYLOCOCCUS SPECIES (COAGULASE NEGATIVE)  Final   Report Status 05/25/2012 FINAL  Final    Coagulation Studies: No results for input(s): LABPROT, INR in the last 72 hours.  Imaging: Dg Chest 2 View  Result Date: 04/07/2018 CLINICAL DATA:  Confusion, not making sense, history cirrhosis EXAM: CHEST - 2 VIEW COMPARISON:  05/23/2006 FINDINGS: Upper normal heart size. RIGHT-side aortic arch on previous exam less well demonstrated on current study with LEFT paraspinal calcification identified at the aberrant LEFT subclavian artery as demonstrated on prior CT. Mediastinal contours and pulmonary vascularity otherwise normal. Atherosclerotic calcifications aorta. Minimal RIGHT basilar atelectasis. Lungs otherwise clear. No pleural effusion or pneumothorax. Bones demineralized. 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 Minimal RIGHT basilar atelectasis. Electronically Signed   By: Lavonia Dana M.D.   On: 04/07/2018 19:11   Ct Head Wo Contrast  Result Date: 04/07/2018 CLINICAL DATA:  Confusion, not making sense, rambling sentences, altered behavior, history hyperlipidemia, cirrhosis EXAM: CT HEAD WITHOUT CONTRAST TECHNIQUE: Contiguous axial images were obtained from the base of the skull through the vertex without intravenous contrast. Sagittal and coronal MPR images reconstructed from axial data set. COMPARISON:  None FINDINGS: Brain: Generalized atrophy.  Normal ventricular morphology. No midline shift or mass effect. Minimal small vessel chronic ischemic changes of deep cerebral white matter. No intracranial hemorrhage, mass lesion, evidence of acute infarction, or extra-axial fluid collection. Vascular: Extensive atherosclerotic calcifications of internal carotid and to lesser degree vertebral arteries at skull base Skull: Demineralized but intact Sinuses/Orbits: Clear Other: N/A IMPRESSION: Atrophy with small vessel chronic ischemic changes of deep cerebral white matter. No acute intracranial abnormalities. Electronically Signed   By: Lavonia Dana M.D.   On: 04/07/2018 19:08   US Carotid Bilateral  Result Date: 04/08/2018 CLINICAL DATA:  Stroke EXAM: BILATERAL CAROTID DUPLEX ULTRASOUND TECHNIQUE: Pearline Cables scale imaging, color Doppler and duplex ultrasound were performed of bilateral carotid and vertebral arteries in the neck. COMPARISON:  None. FINDINGS: Criteria: Quantification of carotid stenosis is based on velocity parameters that correlate the residual internal carotid diameter with NASCET-based stenosis levels, using the diameter of the distal internal carotid lumen as the denominator for stenosis measurement. The following velocity measurements were obtained: RIGHT ICA: 140/42 cm/sec CCA: 38/25 cm/sec SYSTOLIC ICA/CCA RATIO:  1.5 ECA: 106 cm/sec LEFT ICA: 104/25 cm/sec CCA: 05/39 cm/sec SYSTOLIC ICA/CCA RATIO:  1.1 ECA: 103 cm/sec RIGHT CAROTID ARTERY: Calcified plaque in the carotid bulb and proximal ICA resulting in at least mild stenosis. Normal waveforms and color Doppler signal. RIGHT VERTEBRAL ARTERY:  Normal flow direction and waveform. LEFT CAROTID ARTERY: Eccentric plaque in the common carotid artery. Calcified plaque in the bulb and proximal ICA. No high-grade stenosis. Normal waveforms and color Doppler signal. LEFT VERTEBRAL ARTERY:  Normal flow direction and waveform. IMPRESSION: 1. Bilateral carotid bifurcation and proximal ICA plaque,  resulting in less than 50% diameter stenosis on the left, 50-69% diameter proximal right ICA stenosis. 2.  Antegrade bilateral vertebral arterial flow. Electronically Signed   By: Lucrezia Europe M.D.   On: 04/08/2018 11:56    Medications:  I have reviewed the patient's current medications. Prior to Admission:  Medications Prior to Admission  Medication Sig Dispense Refill Last Dose  . ALPRAZolam (XANAX) 1 MG tablet Take 1 mg by mouth 3 (three) times daily.     04/07/2018 at Unknown time  . levothyroxine (SYNTHROID, LEVOTHROID) 50 MCG tablet TAKE 1 TABLET BY MOUTH EVERY DAY 90 tablet 0 04/07/2018 at Unknown time  . losartan (COZAAR) 100 MG tablet TAKE 1 TABLET BY MOUTH DAILY 90 tablet 0 04/07/2018 at Unknown time  . oxycodone (ROXICODONE) 30 MG immediate release tablet Take 1 tablet by mouth 5 (five) times daily as needed.   04/07/2018 at Unknown time  . mupirocin cream (BACTROBAN) 2 % Apply topically 3 (three) times daily. (Patient not taking: Reported on 11/22/2016) 15 g 0 Not Taking at Unknown time  . sildenafil (VIAGRA) 100 MG tablet Take 100 mg by mouth as needed.     0prn at prn  . triamcinolone cream (KENALOG) 0.1 % APPLY TO THE AFFECTED AREA FOUR TIMES DAILY AS NEEDED FOR ITCHING (Patient not taking: Reported on 04/07/2018) 30 g 0 Not Taking at Unknown time   Scheduled: . aspirin  81 mg Oral Daily  . levothyroxine  50 mcg Oral QAC breakfast  . losartan  100 mg Oral Daily    Patient seen and examined.  Clinical course and management discussed.  Necessary edits performed.  I agree with the above.  Assessment and plan of care developed and discussed below.    Assessment: 67 year old male with a history of cirrhosis and chronic pain who presents for altered mental status likely due to hepatic encephalopathy.  Patient improving. Ammonia levels down from 54 umo/L to 16 umo/L. Urine toxicology positive for opiods and tricyclics. MRI remains pending  Plan 1. MRI brain pending.  If no acute changes noted  no further neurologic intervention is recommended at this time.  If further questions arise, please call or page at that time.  Thank you for allowing neurology to participate in the care of this patient. 2. Agree with continuing medical management   This patient was staffed with Dr. Magda Paganini, Doy Mince who personally evaluated patient, reviewed  documentation and agreed with assessment and plan of care as above.  Rufina Falco, DNP, FNP-BC Board certified Nurse Practitioner Neurology Department   LOS: 2 days    04/09/2018  11:00 AM   Alexis Goodell, MD Neurology [phone]  04/09/2018  1:10 PM

## 2018-04-09 NOTE — Progress Notes (Signed)
PT Cancellation Note  Patient Details Name: [name]. MRN: [id number] DOB: 1951/04/07   Cancelled Treatment:    Reason Eval/Treat Not Completed: Other (comment): Per RN, pt sleeping now, had a rough night with staff. Pt not appropriate for PT evaluation this morning. Will re-attempt this afternoon as medically appropriate and as schedule permits.    Linus Salmons PT, DPT 04/09/18, 8:21 AM

## 2018-04-10 LAB — ECHOCARDIOGRAM COMPLETE
HEIGHTINCHES: 72 in
Weight: 3312 oz

## 2018-04-10 MED ORDER — LACTULOSE 10 GM/15ML PO SOLN
30.0000 g | Freq: Every day | ORAL | Status: DC
  Administered 2018-04-10 – 2018-04-11 (×2): 30 g via ORAL
  Filled 2018-04-10 (×2): qty 60

## 2018-04-10 NOTE — Evaluation (Signed)
Speech Language Pathology Evaluation Patient Details Name: [name]. MRN: [id number] DOB: [birth year] Today's Date: 04/10/2018 Time: 6269-4854 SLP Time Calculation (min) (ACUTE ONLY): 45 min  Problem List:  Patient Active Problem List   Diagnosis Date Noted  . Acute hepatic encephalopathy 04/07/2018  . Hepatic encephalopathy ([place]) 04/07/2018  . Chronic pain syndrome 11/23/2016  . Hypothyroidism 09/25/2016  . Cellulitis of right thigh 05/21/2012  . Pancytopenia ([place]) 02/26/2012  . Encounter for long-term (current) use of other medications 02/26/2012  . Screening for prostate cancer 02/26/2012  . Anemia 02/26/2012  . Closed fracture of carpal bone 10/12/2009  . Dyslipidemia 09/12/2009  . Phantom limb syndrome ([place]) 09/12/2009  . HYPOCALCEMIA 12/23/2007  . 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 UROLITHIASIS, HX OF 12/23/2007  . 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 HEPATITIS C 03/24/2007  . Anxiety 03/24/2007  . Hepatic cirrhosis ([place]) 03/24/2007   Past Medical History:  Past Medical History:  Diagnosis Date  . 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 ANXIETY 03/24/2007  . Chronic pain   . CIRRHOSIS 03/24/2007  . 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 FRACTURE, WRIST, LEFT 10/12/2009   Qualifier: Diagnosis of  By: Loanne Drilling MD, Jacelyn Pi   . HEPATITIS C 03/24/2007  . HYPERLIPIDEMIA 09/12/2009  . Hypocalcemia 12/23/2007   Qualifier: Diagnosis of  By: Loanne Drilling MD, Jacelyn Pi   . Pancytopenia 12/23/2007  . 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 UROLITHIASIS, HX OF 12/23/2007   Past Surgical History:  Past Surgical History:  Procedure Laterality Date  . ABOVE KNEE LEG AMPUTATION Right    HPI:  Pt is a 67 y.o. male who presents with chief complaint hepatic encephalopathy.  Urine toxicology positive for opiods and tricyclics.  Patient is confused, and his contribution to the information in the HPI is partial and only moderately reliable at  best.  He does seem to have some sense of his current condition, stating that he understands that he is confused and "does not feel like himself".  His brother is at bedside and contributes to the history.  His brother states that he spoke with the patient on Saturday and Sunday and the patient seemed okay, but that today when the "cleaning lady" went to his house she told him that he did not seem to be acting like himself, and she called the brother.  The brother then stated that he went to the patient's house and found him significantly confused.  They then brought him to the ED for evaluation.  Here he was found to have an elevated ammonia level.  Patient if being followed at [facility] for some hepatitis C study being performed there. Patient is less confused per Son present this morning but endorsed some confusion still.  MRI revealed generalized atrophy with chronic small-vessel change of the deep white matter w/ old scattered cortical and subcortical infarctions, most notable in the left posterior frontal and right posterior frontal regions.   Assessment / Plan / Recommendation Clinical Impression  Pt seen today for an informal Cognitive-linguistic screening using the [facility] Cognitive Assessment [facility]). Pt was sitting in his chair in the room; mild discomfort d/t chronic back issues(NSG aware). Pt is verbally conversive often talking quickly. He appeared somewhat distracted but was able to focus on all tasks of the screening tool. Pt wears glasses (in place) and wears partials w/ native dentition. Overall speech intelligibility was adequate but improved when pt slowed  down. No overt Motor Speech deficits noted. Pt demonstrated a score of 22/30 on the [facility] w/ strengths in the areas of naming, attention w/ calculation, repetition(immediate), and abstraction. Pt exhibited mild weaknesses in the areas of executive functioning, delayed recall of words/info, fluency in language, and orientation. With verbal cues  (semantic and/or choice of 2/3), pt was able to improve accuracy of responses. Suspect recent illness could impact Cognitive attention and follow through but unsure of pt's baseline Cognitive functioning in general as Son was not present. Pt does live alone in an apartment but does have personal care attendants for some self care items daily per chart notes. Recommend f/u w/ a more formal Cognitive assessment and tx if warranted in order to address any safety awareness issues/concerns.      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    Follow Up Recommendations  Home health SLP    Frequency and Duration           SLP Evaluation Cognition  Overall Cognitive Status: Impaired/Different from baseline Arousal/Alertness: Awake/alert Orientation Level: Oriented to person;Oriented to place(Oriented to month; NOT oriented to year) Attention: Focused Focused Attention: Impaired Focused Attention Impairment: Verbal complex;Functional complex(somewhat distracted) Memory: Impaired Memory Impairment: Decreased recall of new information Awareness: Appears intact(grossly) Problem Solving: Impaired(min) Problem Solving Impairment: Verbal complex;Functional complex(min) Executive Function: Reasoning;Sequencing Reasoning: Impaired Reasoning Impairment: Verbal complex;Functional complex(min) Sequencing: Impaired Sequencing Impairment: Verbal complex;Functional complex(min) Behaviors: Impulsive;Perseveration(repeated some information) Safety/Judgment: (needs further assessment in ADLs)       Comprehension  Auditory Comprehension Overall Auditory Comprehension: Appears within functional limits for tasks assessed(grossly w/ basic information) Visual  Recognition/Discrimination Discrimination: Not tested Reading Comprehension Reading Status: Not tested    Expression Expression Primary Mode of Expression: Verbal Verbal Expression Overall Verbal Expression: Appears within functional limits for tasks assessed(grossly w/ basic information) Written Expression Dominant Hand: Right Written Expression: Not tested   Oral / Motor  Oral Motor/Sensory Function Overall Oral Motor/Sensory Function: Within functional limits(grossly) Motor Speech Overall Motor Speech: Appears within functional limits for tasks assessed(grossly; wears partials) Respiration: Within functional limits Phonation: Normal Resonance: Within functional limits Articulation: (slightly decreased but grossly WFL - no changes per pt) Intelligibility: Intelligible Motor Planning: Witnin functional limits Motor Speech Errors: Not applicable Interfering Components: Inadequate dentition(wears partials ) Effective Techniques: Slow rate   GO                     Orinda Kenner, MS, CCC-SLP Starling Jessie 04/10/2018, 2:23 PM

## 2018-04-10 NOTE — Care Management Note (Signed)
Case Management Note  Patient Details  Name: [name]. MRN: [id number] Date of Birth: May 17, 1951  Subjective/Objective: Admitted to [facility] with the diagnosis of acute hepatic encephalopathy. Lives alone. Son is [name] [phone]). Dr. Hilliard Clark is listed at primary care physician., Prescriptions are filled at [facility]. No home health. No skilled facility. No home oxygen. Receives 60 hours per CAP program. Motorized wheelchair, wheelchair, and crutches in the home. Takes care of all basic activities of daily living himself, doesn't drive.  Family will transport                 Action/Plan:Will need services in the home. Discussed agencies in the home. Would like [facility]. Floydene Flock, [facility] updated. Will need rolling walker   Expected Discharge Date:  04/09/18               Expected Discharge Plan:     In-House Referral:   yes  Discharge planning Services   yes  Post Acute Care Choice:   yes Choice offered to:   patient  DME Arranged:   yes [facility] Agency[facility]  HH Arranged:   yes HH Agency[facility]   [facility]  Status of Service:     If discussed at [place] of Stay Meetings, dates discussed:    Additional Comments:  Shelbie Ammons, RN MSN CCM Care Management [phone] 04/10/2018, 11:18 AM

## 2018-04-10 NOTE — Evaluation (Signed)
Physical Therapy Evaluation Patient Details Name: [name] [name]. MRN: [id number] DOB: 1951-07-12 Today's Date: 04/10/2018   History of Present Illness  Pt is a66 y.o.malewho presents with chief complaint of AMS. Pt's brother stated that he spoke with the patient and found him significantly confused. They then brought him to the ED for evaluation. Pt was found to have an elevated ammonia level. Patient and brother stated that he has never had anything like this before, but that he has been following at [facility] for some hepatitis C study being performed there. Given his level of encephalopathy, hospitalist were called for admission.  Assessment includes: Acute encephalopathy secondary to a combination of hepatic encephalopathy due to elevated ammonia level as well as pain medication induced, liver cirrhosis with hep C, hypokalemia, hypothyroidism, HTN, thrombocytopenia, B12 deficiency, weakness, and a h/o RLE AKA.     Clinical Impression  Pt presents with deficits in strength, transfers, mobility, gait, balance, and activity tolerance.  Pt required extra time and effort with bed mobility tasks but no physical assistance.  Pt required CGA and cues for sequencing to stand from an elevated surface and was steady once in standing with a RW.  Pt was able to amb with hop-to pattern 10+ feet in the room with occasional min instability but no physical assistance required to prevent LOB.  Overall pt performed well during the session and will benefit from HHPT services upon discharge to safely address above deficits for decreased caregiver assistance and eventual return to PLOF.      Follow Up Recommendations Home health PT;Supervision for mobility/OOB    Equipment Recommendations  Rolling walker with 5" wheels    Recommendations for Other Services       Precautions / Restrictions Precautions Precautions: Fall Restrictions Weight Bearing Restrictions: No      Mobility  Bed  Mobility Overal bed mobility: Modified Independent             General bed mobility comments: Extra time and effort but no physical assistance required  Transfers Overall transfer level: Needs assistance Equipment used: Rolling walker (2 wheeled) Transfers: Sit to/from Stand Sit to Stand: From elevated surface;Min guard         General transfer comment: Mod verbal cues for sequencing  Ambulation/Gait Ambulation/Gait assistance: Min guard Gait Distance (Feet): 10 Feet Assistive device: Rolling walker (2 wheeled)       General Gait Details: Hop-to gait pattern with min instability that pt was able to self-correct without therapist intervention  Stairs            Wheelchair Mobility    Modified Rankin (Stroke Patients Only)       Balance Overall balance assessment: Mild deficits observed, not formally tested                                           Pertinent Vitals/Pain Pain Assessment: 0-10 Pain Score: 7  Pain Location: Back pain, chronic per pt Pain Descriptors / Indicators: Aching;Sore Pain Intervention(s): Premedicated before session;Monitored during session    [place] expects to be discharged to:: Private residence Living Arrangements: Alone Available Help at Discharge: Family;Personal care attendant;Available PRN/intermittently(PCA 6 days/wk for 2-3 hours/day) Type of Home: Apartment Home Access: Level entry     Home Layout: One level Home Equipment: Crutches;Wheelchair - power;Wheelchair - manual      Prior Function Level of Independence: Needs  assistance   Gait / Transfers Assistance Needed: Limited amb in home with crutches, w/c for most home and community access, no fall history   ADL's / Homemaking Assistance Needed: PCA assists with bathing and set-up for dressing        Hand Dominance        Extremity/Trunk Assessment   Upper Extremity Assessment Upper Extremity Assessment: Overall WFL  for tasks assessed    Lower Extremity Assessment Lower Extremity Assessment: Generalized weakness       Communication   Communication: No difficulties  Cognition Arousal/Alertness: Awake/alert Behavior During Therapy: WFL for tasks assessed/performed Overall Cognitive Status: Impaired/Different from baseline Area of Impairment: Safety/judgement;Awareness                         Safety/Judgement: Decreased awareness of safety     General Comments: Pt able to follow commands and provide a history but pt reports continues to feel "foggy" and minimally confused at times; pt impulsive during the session      General Comments      Exercises Other Exercises Other Exercises: Sit to/from stand transfer training from various surfaces with cues for sequencing   Assessment/Plan    PT Assessment Patient needs continued PT services  PT Problem List Decreased strength;Decreased activity tolerance;Decreased balance;Decreased mobility;Decreased knowledge of use of DME;Decreased safety awareness       PT Treatment Interventions DME instruction;Gait training;Functional mobility training;Balance training;Therapeutic exercise;Therapeutic activities;Patient/family education    PT Goals (Current goals can be found in the Care Plan section)  Acute Rehab PT Goals Patient Stated Goal: To get stronger and return home PT Goal Formulation: With patient Time For Goal Achievement: 04/23/18 Potential to Achieve Goals: Good    Frequency Min 2X/week   Barriers to discharge        Co-evaluation               AM-PAC PT "6 Clicks" Daily Activity  Outcome Measure Difficulty turning over in bed (including adjusting bedclothes, sheets and blankets)?: A Little Difficulty moving from lying on back to sitting on the side of the bed? : A Little Difficulty sitting down on and standing up from a chair with arms (e.g., wheelchair, bedside commode, etc,.)?: Unable Help needed moving to and  from a bed to chair (including a wheelchair)?: A Little Help needed walking in hospital room?: A Little Help needed climbing 3-5 steps with a railing? : Total 6 Click Score: 14    End of Session Equipment Utilized During Treatment: Gait belt Activity Tolerance: Patient tolerated treatment well Patient left: in chair;with chair alarm set;with call bell/phone within reach Nurse Communication: Mobility status PT Visit Diagnosis: Muscle weakness (generalized) (M62.81);Difficulty in walking, not elsewhere classified (R26.2)    Time: 0932-6712 PT Time Calculation (min) (ACUTE ONLY): 25 min   Charges:   PT Evaluation $PT Eval Low Complexity: 1 Low PT Treatments $Therapeutic Activity: 8-22 mins        D. Scott Jennye Runquist PT, DPT 04/10/18, 1:25 PM

## 2018-04-10 NOTE — Progress Notes (Signed)
[facility] [place] NAME: [name]    MR#:  [id number]  DATE OF BIRTH:  [birth year]  SUBJECTIVE:   Patient seen today Alert and responds to all commands No fever  REVIEW OF SYSTEMS:    Review of Systems  Constitutional: Negative.  Negative for chills, fever and malaise/fatigue.  HENT: Negative.  Negative for ear discharge, ear pain, hearing loss, nosebleeds and sore throat.   Eyes: Negative.  Negative for blurred vision and pain.  Respiratory: Negative.  Negative for cough, hemoptysis, shortness of breath and wheezing.   Cardiovascular: Negative.  Negative for chest pain, palpitations and leg swelling.  Gastrointestinal: Negative.  Negative for abdominal pain, blood in stool, diarrhea, nausea and vomiting.  Genitourinary: Negative.  Negative for dysuria.  Musculoskeletal: Negative.  Negative for back pain.  Skin: Negative.   Neurological: Negative for dizziness, tremors, speech change, focal weakness, seizures and headaches.  Endo/Heme/Allergies: Negative.  Does not bruise/bleed easily.  Psychiatric/Behavioral: Negative.  Negative for depression, hallucinations and suicidal ideas.      Tolerating Diet: yes      DRUG ALLERGIES:  No Known Allergies  VITALS:  Blood pressure 137/81, pulse 91, temperature 98.7 F (37.1 C), temperature source Oral, resp. rate 18, height 6' (1.829 m), weight 93.9 kg, SpO2 98 %.  PHYSICAL EXAMINATION:  Constitutional: Appears well-developed and well-nourished. No distress. HENT: Normocephalic. Marland Kitchen Oropharynx is clear and moist.  Eyes: Conjunctivae and EOM are normal. PERRLA, no scleral icterus.  Neck: Normal ROM. Neck supple. No JVD. No tracheal deviation. CVS: RRR, S1/S2 +, no murmurs, no gallops, no carotid bruit.  Pulmonary: Effort and breath sounds normal, no stridor, rhonchi, wheezes, rales.  Abdominal: Soft. BS +,  no distension, tenderness, rebound or guarding.  Musculoskeletal: Normal  range of motion. No edema and no tenderness. right AKA Neuro: Alert. CN 2-12 grossly intact. No focal deficits. Patient may be a phasic and confused Skin: Skin is warm and dry. No rash noted. Psychiatric: Confused     LABORATORY PANEL:   CBC Recent Labs  Lab 04/08/18 0435  WBC 3.1*  HGB 10.9*  HCT 30.9*  PLT 55*   ------------------------------------------------------------------------------------------------------------------  Chemistries  Recent Labs  Lab 04/07/18 1740 04/07/18 1830  04/09/18 0817  NA 137  --    < > 139  K 3.9  --    < > 3.5  CL 104  --    < > 106  CO2 25  --    < > 24  GLUCOSE 103*  --    < > 122*  BUN 14  --    < > 14  CREATININE 0.64  --    < > 0.46*  CALCIUM 9.3  --    < > 9.1  MG  --  1.7  --   --   AST 35  --   --   --   ALT 19  --   --   --   ALKPHOS 122  --   --   --   BILITOT 3.0*  --   --   --    < > = values in this interval not displayed.   ------------------------------------------------------------------------------------------------------------------  Cardiac Enzymes Recent Labs  Lab 04/04/18 0810 04/07/18 1830  TROPONINI <0.03 <0.03   ------------------------------------------------------------------------------------------------------------------  RADIOLOGY:  Mr Brain Wo Contrast  Result Date: 04/09/2018 CLINICAL DATA:  Confusion.  Mental status changes. EXAM: MRI HEAD WITHOUT CONTRAST TECHNIQUE: Multiplanar, multiecho pulse sequences of the  brain and surrounding structures were obtained without intravenous contrast. COMPARISON:  Head CT 04/07/2018 FINDINGS: Brain: The study suffers from motion degradation but is sufficiently diagnostic. Diffusion imaging does not show any acute or subacute infarction. The brainstem and cerebellum are normal. Cerebral hemispheres show mild generalized atrophy with mild chronic small-vessel change of the deep white matter. There are a few old scattered cortical and subcortical infarctions,  most notable in the left posterior frontal and right posterior frontal regions. No large vessel territory infarction. No mass lesion, hemorrhage, hydrocephalus or extra-axial collection. Vascular: Major vessels at the base of the brain show flow. Skull and upper cervical spine: Negative Sinuses/Orbits: Clear. No acute orbital finding. Previous lens implant on the left. Other: None IMPRESSION: No acute finding by MRI. Generalized atrophy. Mild chronic small-vessel change of the white matter. 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 Electronically Signed   By: Nelson Chimes M.D.   On: 04/09/2018 11:48     ASSESSMENT AND PLAN:   67 year old male with history of hepatitis C and liver cirrhosis who presents with encephalopathy.  1. Acute encephalopathy: Patient had a combination of hepatic encephalopathy due to elevated ammonia level as well as pain medication induced.  He underwent CVA work-up including MRI, echo and carotid Doppler essentially all of these were unremarkable.  He is back to his baseline.  I am suspecting a combination of mild hepatic encephalopathy and opioid/pain medication induced encephalopathy. Lactulose resumed  2. Liver cirrhosis with hepatitis C: Patient has outpatient follow-up at [facility]  3. Hypokalemia: This was repleted  4. Hypothyroidism: Continue Synthroid. TSH was within normal limits  5. Essential hypertension: Continue losartan  6. Thrombocytopenia: This is chronic in nature and due to liver cirrhosis  7.  B12 deficiency: Patient will need oral B12 supplements  8. Ambulatory dysfunction and weakness PT evaluation today  Management plans discussed with nursing  CODE STATUS: FULL  TOTAL TIME TAKING CARE OF THIS PATIENT: 32 minutes.  D/w patient's son   POSSIBLE D/C today, DEPENDING ON CLINICAL CONDITION.   Saundra Shelling M.D on 04/10/2018 at 11:09 AM  Between 7am to 6pm - Pager - [phone] After 6pm go to  [email or web address] - password [email or web address] [place] [facility]  Office  [phone]  CC: Primary care physician; Renato Shin, MD  Note: This dictation was prepared with Dragon dictation along with smaller phrase technology. Any transcriptional errors that result from this process are unintentional.

## 2018-04-10 NOTE — Care Management Important Message (Signed)
Important Message  Patient Details  Name: [name]. MRN: [id number] Date of Birth: 1950/11/15   Medicare Important Message Given:  Yes    Juliann Pulse A Ameliarose Shark 04/10/2018, 11:25 AM

## 2018-04-10 NOTE — Evaluation (Signed)
Occupational Therapy Evaluation Patient Details Name: [name]. MRN: [id number] DOB: 01-01-1951 Today's Date: 04/10/2018    History of Present Illness Pt is a66 y.o.malewho presents with chief complaint of AMS. Pt's brother stated that he spoke with the patient and found him significantly confused. They then brought him to the ED for evaluation. Pt was found to have an elevated ammonia level. Patient and brother stated that he has never had anything like this before, but that he has been following at [facility] for some hepatitis C study being performed there. Given his level of encephalopathy, hospitalist were called for admission.  Assessment includes: Acute encephalopathy secondary to a combination of hepatic encephalopathy due to elevated ammonia level as well as pain medication induced, liver cirrhosis with hep C, hypokalemia, hypothyroidism, HTN, thrombocytopenia, B12 deficiency, weakness, and a h/o RLE AKA.    Clinical Impression   Pt seen for OT evaluation this date. Prior to hospital admission, pt was living independently in a handicap accessible apartment. Pt typically in electric scooter or w/c for mobility needs. Indep with ADL and IADL, including driving despite R AKA. Currently pt demonstrates impairments in chronic pain, activity tolerance, cognition, and requiring min assist for LB ADL and CGA for mobility w/ RW. Pt/brother instructed in home/routines mods, set up, medication mgt strategies, and falls prevention to maximize safety at home. Pt would benefit from skilled OT to address noted impairments and functional limitations (see below for any additional details) in order to maximize safety and independence while minimizing falls risk and caregiver burden.  Upon hospital discharge, recommend pt discharge to home with initial 24/7 assist and [place] services to maximize safe return to prior daily routines.     Follow Up Recommendations  Home health OT;Supervision/Assistance -  24 hour    Equipment Recommendations  None recommended by OT    Recommendations for Other Services       Precautions / Restrictions Precautions Precautions: Fall Restrictions Weight Bearing Restrictions: No      Mobility Bed Mobility             General bed mobility comments: deferred, up in recliner  Transfers Overall transfer level: Needs assistance Equipment used: Rolling walker (2 wheeled) Transfers: Sit to/from Stand Sit to Stand: From elevated surface;Min guard         General transfer comment: Mod verbal cues for sequencing    Balance Overall balance assessment: Mild deficits observed, not formally tested                                         ADL either performed or assessed with clinical judgement   ADL Overall ADL's : Needs assistance/impaired Eating/Feeding: Sitting;Independent   Grooming: Sitting;Independent   Upper Body Bathing: Sitting;Supervision/ safety   Lower Body Bathing: Sit to/from stand;Minimal assistance   Upper Body Dressing : Sitting;Supervision/safety   Lower Body Dressing: Sit to/from stand;Minimal assistance   Toilet Transfer: RW;BSC;Ambulation;Min guard                   Vision Baseline Vision/History: Wears glasses Wears Glasses: At all times Patient Visual Report: No change from baseline Vision Assessment?: No apparent visual deficits     Perception     Praxis      Pertinent Vitals/Pain Pain Assessment: 0-10 Pain Score: 7  Pain Location: Back pain, chronic per pt Pain Descriptors / Indicators: Aching;Sore Pain Intervention(s): Limited  activity within patient's tolerance;Monitored during session;Premedicated before session;Repositioned     Hand Dominance Right   Extremity/Trunk Assessment Upper Extremity Assessment Upper Extremity Assessment: Overall WFL for tasks assessed   Lower Extremity Assessment Lower Extremity Assessment: Defer to PT evaluation;Generalized weakness        Communication Communication Communication: No difficulties   Cognition Arousal/Alertness: Awake/alert Behavior During Therapy: WFL for tasks assessed/performed Overall Cognitive Status: Impaired/Different from baseline Area of Impairment: Safety/judgement;Awareness                         Safety/Judgement: Decreased awareness of safety     General Comments: Pt able to follow commands and provide a history but pt reports continues to feel "foggy" and minimally confused at times; pt impulsive during the session   General Comments       Exercises Other Exercises Other Exercises: OT facilitated problem solving for eventual return home. Pt/brother verbalized plan to set up home delivery for medications and for groceries. Son setting up medical transport per pt.  Other Exercises: Pt/brother educated in medication mgt options to maximize adherence. Pt states he places all medicine bottles in a plastic bag. OT educated pt/brother in benefits of compensatory tools such as weekly pill organizers to maximize safety and adherence   Shoulder Instructions      Home Living Family/patient expects to be discharged to:: Private residence Living Arrangements: Alone Available Help at Discharge: Family;Personal care attendant;Available PRN/intermittently(PCA ~2hrs/day, 60 hrs/mo) Type of Home: Apartment Home Access: Level entry     Home Layout: One level     Bathroom Shower/Tub: Occupational psychologist: Handicapped height Bathroom Accessibility: Yes   Home Equipment: Crutches;Wheelchair - power;Wheelchair - Press photographer      Lives With: Alone    Prior Functioning/Environment Level of Independence: Needs assistance  Gait / Transfers Assistance Needed: Limited amb in home with crutches, w/c for most home and community access, ~12 falls/12 months ADL's / Homemaking Assistance Needed: PCA assists with bathing, set up for dressing, and housekeeping tasks             OT Problem List: Decreased strength;Decreased knowledge of use of DME or AE;Decreased activity tolerance;Decreased cognition;Pain;Impaired balance (sitting and/or standing);Decreased safety awareness      OT Treatment/Interventions: Self-care/ADL training;Balance training;Therapeutic exercise;Therapeutic activities;DME and/or AE instruction;Patient/family education    OT Goals(Current goals can be found in the care plan section) Acute Rehab OT Goals Patient Stated Goal: To get stronger and return home OT Goal Formulation: With patient/family Time For Goal Achievement: 04/24/18 Potential to Achieve Goals: Good ADL Goals Pt Will Perform Lower Body Dressing: with modified independence;sit to/from stand Pt Will Transfer to Toilet: with modified independence;ambulating;regular height toilet(LRAD for amb) Additional ADL Goal #1: Pt will demonstrate independence with medication mgt with 100% accuracy including reading bottle labels, filling pill box, and verbalizing schedule for taking each to maximize safety and independence with medication mgt at home.  OT Frequency: Min 1X/week   Barriers to D/C: Decreased caregiver support          Co-evaluation              AM-PAC PT "6 Clicks" Daily Activity     Outcome Measure Help from another person eating meals?: None Help from another person taking care of personal grooming?: None Help from another person toileting, which includes using toliet, bedpan, or urinal?: A Little Help from another person bathing (including washing, rinsing, drying)?: A Little Help from another  person to put on and taking off regular upper body clothing?: None Help from another person to put on and taking off regular lower body clothing?: A Little 6 Click Score: 21   End of Session    Activity Tolerance: Patient tolerated treatment well Patient left: in chair;with call bell/phone within reach;with chair alarm set;with family/visitor present  OT  Visit Diagnosis: Other abnormalities of gait and mobility (R26.89);Repeated falls (R29.6)                Time: 1314-3888 OT Time Calculation (min): 18 min Charges:  OT General Charges $OT Visit: 1 Visit OT Evaluation $OT Eval Low Complexity: 1 Low OT Treatments $Self Care/Home Management : 8-22 mins  Jeni Salles, MPH, MS, OTR/L ascom [phone] 04/10/18, 2:37 PM

## 2018-04-11 ENCOUNTER — Other Ambulatory Visit: Payer: Self-pay | Admitting: Endocrinology

## 2018-04-11 MED ORDER — LACTULOSE 10 GM/15ML PO SOLN: 30.0000 g | Freq: Two times a day (BID) | ORAL | 0 refills | Status: DC | PRN

## 2018-04-11 NOTE — Discharge Summary (Signed)
[facility] [place] NAME: [name]    MR#:  [id number]  DATE OF BIRTH:  [birth year]  DATE OF ADMISSION:  04/07/2018 ADMITTING PHYSICIAN: [name], MD  DATE OF DISCHARGE: 04/11/2018  PRIMARY CARE PHYSICIAN: Renato Shin, MD    ADMISSION DIAGNOSIS:  Hepatic encephalopathy ([place]) [K72.90]  DISCHARGE DIAGNOSIS:  Principal Problem:   Acute hepatic encephalopathy Active Problems:   Dyslipidemia   Anxiety   Hepatic cirrhosis (H[facility])   Hypothyroidism   Hepatic encephalopathy ([place])   SECONDARY DIAGNOSIS:   Past Medical History:  Diagnosis Date  . 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 ANXIETY 03/24/2007  . Chronic pain   . CIRRHOSIS 03/24/2007  . 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 FRACTURE, WRIST, LEFT 10/12/2009   Qualifier: Diagnosis of  By: Loanne Drilling MD, Jacelyn Pi   . HEPATITIS C 03/24/2007  . HYPERLIPIDEMIA 09/12/2009  . Hypocalcemia 12/23/2007   Qualifier: Diagnosis of  By: Loanne Drilling MD, Jacelyn Pi   . Pancytopenia 12/23/2007  . 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 UROLITHIASIS, HX OF 12/23/2007    HOSPITAL COURSE:  67 year old male with history of hepatitis C and liver cirrhosis who presents with encephalopathy.  1.  Acute encephalopathy: Patient had a combination of hepatic encephalopathy due to elevated ammonia level as well as pain medication induced.  He underwent CVA work-up including MRI, echo and carotid Doppler essentially all of these were unremarkable.  He is back to his baseline.  I am suspecting a combination of mild hepatic encephalopathy and opioid/pain medication and reduce encephalopathy.  2.  Liver cirrhosis with hepatitis C: Patient has outpatient follow-up at [facility]  3.  Hypokalemia: This was repleted  4.  Hypothyroidism: Continue Synthroid. TSH was within normal limits  5.  Essential hypertension: Continue losartan  6.  Thrombocytopenia: This is chronic in  nature and due to liver cirrhosis  7.  B12 deficiency: Patient will need oral B12 supplements DISCHARGE CONDITIONS AND DIET:   Stable Cardiac diet  CONSULTS OBTAINED:  Treatment Team:  Alexis Goodell, MD  DRUG ALLERGIES:  No Known Allergies  DISCHARGE MEDICATIONS:   Allergies as of 04/11/2018   No Known Allergies     Medication List    STOP taking these medications   mupirocin cream 2 % Commonly known as:  BACTROBAN   triamcinolone cream 0.1 % Commonly known as:  KENALOG     TAKE these medications   ALPRAZolam 1 MG tablet Commonly known as:  XANAX Take 1 mg by mouth 3 (three) times daily.   lactulose 10 GM/15ML solution Commonly known as:  CHRONULAC Take 45 mLs (30 g total) by mouth 2 (two) times daily as needed for mild constipation or moderate constipation.   levothyroxine 50 MCG tablet Commonly known as:  SYNTHROID, LEVOTHROID TAKE 1 TABLET BY MOUTH EVERY DAY   losartan 100 MG tablet Commonly known as:  COZAAR TAKE 1 TABLET BY MOUTH DAILY   oxycodone 30 MG immediate release tablet Commonly known as:  ROXICODONE Take 1 tablet by mouth 5 (five) times daily as needed.   sildenafil 100 MG tablet Commonly known as:  VIAGRA Take 100 mg by mouth as needed.   vitamin B-12 1000 MCG tablet Commonly known as:  CYANOCOBALAMIN Take 1 tablet (1,000 mcg total) by mouth daily.            Durable Medical Equipment  (From admission, onward)  Start     Ordered   04/11/18 0906  For home use only DME Walker rolling  Once    Question:  Patient needs a walker to treat with the following condition  Answer:  Ambulatory dysfunction   04/11/18 0905            Today  Patient seen and evaluated today No new complaints Tolerating diet well Will be discharged home with home health services and rolling walker  CHIEF COMPLAINT:  Patient is back to baseline this am Son at bedside   VITAL SIGNS:  Blood pressure 124/71, pulse 88, temperature 98.1 F  (36.7 C), temperature source Oral, resp. rate 18, height 6' (1.829 m), weight 93.9 kg, SpO2 98 %.   REVIEW OF SYSTEMS:  Review of Systems  Constitutional: Negative.  Negative for chills, fever and malaise/fatigue.  HENT: Negative.  Negative for ear discharge, ear pain, hearing loss, nosebleeds and sore throat.   Eyes: Negative.  Negative for blurred vision and pain.  Respiratory: Negative.  Negative for cough, hemoptysis, shortness of breath and wheezing.   Cardiovascular: Negative.  Negative for chest pain, palpitations and leg swelling.  Gastrointestinal: Negative.  Negative for abdominal pain, blood in stool, diarrhea, nausea and vomiting.  Genitourinary: Negative.  Negative for dysuria.  Musculoskeletal: Negative.  Negative for back pain.  Skin: Negative.   Neurological: Negative for dizziness, tremors, speech change, focal weakness, seizures and headaches.  Endo/Heme/Allergies: Negative.  Does not bruise/bleed easily.  Psychiatric/Behavioral: Negative.  Negative for depression, hallucinations and suicidal ideas.     PHYSICAL EXAMINATION:  GENERAL:  67 y.o.-year-old patient lying in the bed with no acute distress.  NECK:  Supple, no jugular venous distention. No thyroid enlargement, no tenderness.  LUNGS: Normal breath sounds bilaterally, no wheezing, rales,rhonchi  No use of accessory muscles of respiration.  CARDIOVASCULAR: S1, S2 normal. No murmurs, rubs, or gallops.  ABDOMEN: Soft, non-tender, non-distended. Bowel sounds present. No organomegaly or mass.  EXTREMITIES: No pedal edema, cyanosis, or clubbing. Left AKA PSYCHIATRIC: The patient is alert and oriented x 3.  SKIN: No obvious rash, lesion, or ulcer.   DATA REVIEW:   CBC Recent Labs  Lab 04/08/18 0435  WBC 3.1*  HGB 10.9*  HCT 30.9*  PLT 55*    Chemistries  Recent Labs  Lab 04/07/18 1740 04/07/18 1830  04/09/18 0817  NA 137  --    < > 139  K 3.9  --    < > 3.5  CL 104  --    < > 106  CO2 25  --    <  > 24  GLUCOSE 103*  --    < > 122*  BUN 14  --    < > 14  CREATININE 0.64  --    < > 0.46*  CALCIUM 9.3  --    < > 9.1  MG  --  1.7  --   --   AST 35  --   --   --   ALT 19  --   --   --   ALKPHOS 122  --   --   --   BILITOT 3.0*  --   --   --    < > = values in this interval not displayed.    Cardiac Enzymes Recent Labs  Lab 04/07/18 1830  TROPONINI <0.03    Microbiology Results  @MICRORSLT48 @  RADIOLOGY:  Mr Brain Wo Contrast  Result Date: 04/09/2018 CLINICAL DATA:  Confusion.  Mental status changes. EXAM: MRI HEAD WITHOUT CONTRAST TECHNIQUE: Multiplanar, multiecho pulse sequences of the brain and surrounding structures were obtained without intravenous contrast. COMPARISON:  Head CT 04/07/2018 FINDINGS: Brain: The study suffers from motion degradation but is sufficiently diagnostic. Diffusion imaging does not show any acute or subacute infarction. The brainstem and cerebellum are normal. Cerebral hemispheres show mild generalized atrophy with mild chronic small-vessel change of the deep white matter. 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 No large vessel territory infarction. No mass lesion, hemorrhage, hydrocephalus or extra-axial collection. Vascular: Major vessels at the base of the brain show flow. Skull and upper cervical spine: Negative Sinuses/Orbits: Clear. No acute orbital finding. Previous lens implant on the left. Other: None IMPRESSION: No acute finding by MRI. Generalized atrophy. Mild chronic small-vessel change of the white matter. 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 Electronically Signed   By: Nelson Chimes M.D.   On: 04/09/2018 11:48      Allergies as of 04/11/2018   No Known Allergies     Medication List    STOP taking these medications   mupirocin cream 2 % Commonly known as:  BACTROBAN   triamcinolone cream 0.1  % Commonly known as:  KENALOG     TAKE these medications   ALPRAZolam 1 MG tablet Commonly known as:  XANAX Take 1 mg by mouth 3 (three) times daily.   lactulose 10 GM/15ML solution Commonly known as:  CHRONULAC Take 45 mLs (30 g total) by mouth 2 (two) times daily as needed for mild constipation or moderate constipation.   levothyroxine 50 MCG tablet Commonly known as:  SYNTHROID, LEVOTHROID TAKE 1 TABLET BY MOUTH EVERY DAY   losartan 100 MG tablet Commonly known as:  COZAAR TAKE 1 TABLET BY MOUTH DAILY   oxycodone 30 MG immediate release tablet Commonly known as:  ROXICODONE Take 1 tablet by mouth 5 (five) times daily as needed.   sildenafil 100 MG tablet Commonly known as:  VIAGRA Take 100 mg by mouth as needed.   vitamin B-12 1000 MCG tablet Commonly known as:  CYANOCOBALAMIN Take 1 tablet (1,000 mcg total) by mouth daily.            Durable Medical Equipment  (From admission, onward)         Start     Ordered   04/11/18 0906  For home use only DME Walker rolling  Once    Question:  Patient needs a walker to treat with the following condition  Answer:  Ambulatory dysfunction   04/11/18 0905           }   Management plans discussed with the patient and he is in agreement. Stable for discharge   Patient should follow up with pcp  CODE STATUS: Full code    Code Status Orders  (From admission, onward)         Start     Ordered   04/07/18 2207  Full code  Continuous     04/07/18 2206        Code Status History    This patient has a current code status but no historical code status.      TOTAL TIME TAKING CARE OF THIS PATIENT: 35 minutes.    Note: This dictation was prepared with Dragon dictation along with smaller phrase technology. Any transcriptional errors that result from this process are unintentional.  Saundra Shelling M.D on 04/11/2018 at 11:00 AM  Between 7am to 6pm - Pager - [phone] After 6pm go to [email or web address] -  password [email or web address] [facility]  Office  [phone]  CC: Primary care physician; Renato Shin, MD

## 2018-04-11 NOTE — Progress Notes (Signed)
9/13 @ 1345 RX for Lactulose 30g PO daily called into [facility] per verbal order from Dr. Mamie Nick. [place].   Pernell Dupre, PharmD, BCPS Clinical Pharmacist 04/11/2018 2:01 PM

## 2018-04-11 NOTE — Progress Notes (Signed)
Pt being discharge home with home health, discharge instructions reviewed with pt and son, states understanding, belongings returned from safe, pt with no complaints

## 2018-04-14 LAB — VITAMIN B1: VITAMIN B1 (THIAMINE): 85.1 nmol/L (ref 66.5–200.0)

## 2018-04-16 ENCOUNTER — Other Ambulatory Visit: Payer: Self-pay

## 2018-04-16 MED ORDER — LOSARTAN POTASSIUM 100 MG PO TABS: 100.0000 mg | ORAL_TABLET | Freq: Every day | ORAL | 0 refills | Status: DC

## 2018-04-16 MED ORDER — LEVOTHYROXINE SODIUM 50 MCG PO TABS: 50.0000 ug | ORAL_TABLET | Freq: Every day | ORAL | 0 refills | Status: DC

## 2018-05-21 DIAGNOSIS — C4492 Squamous cell carcinoma of skin, unspecified: Secondary | ICD-10-CM

## 2018-05-21 HISTORY — DX: Squamous cell carcinoma of skin, unspecified: C44.92

## 2018-07-12 ENCOUNTER — Other Ambulatory Visit: Payer: Self-pay | Admitting: Endocrinology

## 2018-07-12 NOTE — Telephone Encounter (Signed)
Please refill x 3 months Further refills would have to be considered by new PCP   

## 2018-08-11 ENCOUNTER — Ambulatory Visit: Payer: Self-pay | Admitting: Family Medicine

## 2018-09-19 ENCOUNTER — Telehealth: Payer: Self-pay | Admitting: Endocrinology

## 2018-09-19 ENCOUNTER — Other Ambulatory Visit: Payer: Self-pay | Admitting: Endocrinology

## 2018-09-19 NOTE — Telephone Encounter (Signed)
MEDICATION: losartan (COZAAR) 100 MG tablet  PHARMACY:  [id number] - [place], [place] - [address] AT [address] OF [address] THIS A 90 DAY SUPPLY :  30 DAY SUPPLY ONLY  IS PATIENT OUT OF MEDICATION:  yes IF NOT; HOW MUCH IS LEFT:   LAST APPOINTMENT DATE: @2 /21/2020  NEXT APPOINTMENT DATE:@Visit  date not found  DO WE HAVE YOUR PERMISSION TO LEAVE A DETAILED MESSAGE:  OTHER COMMENTS:   Patient would like a refill of the above medication.  He was advised to establish care with a new PCP, he does not have one at the time since Dr Loanne Drilling no longer does primary care.   He would like to see if a 30 day supply could be sent     **Let patient know to contact pharmacy at the end of the day to make sure medication is ready. **  ** Please notify patient to allow 48-72 hours to process**  **Encourage patient to contact the pharmacy for refills or they can request refills through [facility]**

## 2018-09-22 NOTE — Telephone Encounter (Signed)
Called [phone] to inform pt that this Rx refill request will need to be sent to PCP. Male automated voice informed # not in service. Will not be able to send Rx refill request d/t Dr. Loanne Drilling no longer providing primary care.

## 2018-10-01 ENCOUNTER — Telehealth: Payer: Self-pay | Admitting: Endocrinology

## 2018-10-01 NOTE — Telephone Encounter (Signed)
Called pt and left detailed voicemail was left for pt explaining that all refills for Losartan must come from PCP, as Dr. Loanne Drilling is no longer a PCP.

## 2018-10-01 NOTE — Telephone Encounter (Signed)
Per patient we were supposed to have sent Losartan to the [facility] in [place]. Notes show that there was a final 30 day supply to be sent 09/22/2018.  Patient states that he went to pharmacy today and that there was not an RX there for him for his blood pressure medication.    Please call him at [phone]

## 2018-10-03 ENCOUNTER — Ambulatory Visit (INDEPENDENT_AMBULATORY_CARE_PROVIDER_SITE_OTHER): Payer: Medicare Other | Admitting: Family Medicine

## 2018-10-03 ENCOUNTER — Encounter: Payer: Self-pay | Admitting: Family Medicine

## 2018-10-03 VITALS — BP 140/80 | HR 86 | Temp 98.3°F

## 2018-10-03 DIAGNOSIS — Z23 Encounter for immunization: Secondary | ICD-10-CM | POA: Diagnosis not present

## 2018-10-03 DIAGNOSIS — E039 Hypothyroidism, unspecified: Secondary | ICD-10-CM

## 2018-10-03 DIAGNOSIS — I10 Essential (primary) hypertension: Secondary | ICD-10-CM | POA: Diagnosis not present

## 2018-10-03 MED ORDER — LOSARTAN POTASSIUM 100 MG PO TABS: ORAL_TABLET | ORAL | 0 refills | Status: DC

## 2018-10-03 MED ORDER — LEVOTHYROXINE SODIUM 50 MCG PO TABS: ORAL_TABLET | ORAL | 0 refills | Status: DC

## 2018-10-03 NOTE — Progress Notes (Signed)
Subjective:    Patient ID: [name]., male    DOB: 07-20-1951, 68 y.o.   MRN: [id number]  HPI   Patient presents to clinic to establish with PCP.  Patient has an extensive past medical history.  Patient's active problem list reviewed and updated to reflect current issues being treated.  Patient's past medical records reviewed via epic.  Patient's main concern today is to get refills on medications for his blood pressure and thyroid.  He also would like the seasonal influenza vaccine.  Currently he denies any issues with chest pain, shortness of breath, wheezing, nausea, vomiting, diarrhea.  He does have chronic pain and is treated and managed by a pain management.  Past social, surgical and family history also reviewed and updated accordingly in chart.  Patient Active Problem List   Diagnosis Date Noted  . Acute hepatic encephalopathy 04/07/2018  . Hepatic encephalopathy ([place]) 04/07/2018  . Chronic pain syndrome 11/23/2016  . Hypothyroidism 09/25/2016  . 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 Cellulitis of right thigh 05/21/2012  . Pancytopenia ([place]) 02/26/2012  . Encounter for long-term (current) use of other medications 02/26/2012  . Screening for prostate cancer 02/26/2012  . Anemia 02/26/2012  . Closed fracture of carpal bone 10/12/2009  . Dyslipidemia 09/12/2009  . Phantom limb syndrome ([place]) 09/12/2009  . HYPOCALCEMIA 12/23/2007  . 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 UROLITHIASIS, HX OF 12/23/2007  . 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 HEPATITIS C 03/24/2007  . Anxiety 03/24/2007  . Hepatic cirrhosis ([place]) 03/24/2007   Social History   Tobacco Use  . Smoking status: Never Smoker  . Smokeless tobacco: Never Used  Substance Use Topics  . Alcohol use: Not Currently    Alcohol/week: 0.0 standard drinks   Past Surgical History:  Procedure Laterality Date  . ABOVE KNEE LEG AMPUTATION Right      Family History  Problem Relation Age of Onset  . Heart disease Mother   . Cancer Neg Hx    Review of Systems  Constitutional: Negative for chills, fatigue and fever.  HENT: Negative for congestion, ear pain, sinus pain and sore throat.   Eyes: Negative.   Respiratory: Negative for cough, shortness of breath and wheezing.   Cardiovascular: Negative for chest pain, palpitations and leg swelling.  Gastrointestinal: Negative for abdominal pain, diarrhea, nausea and vomiting.  Genitourinary: Negative for dysuria, frequency and urgency.  Musculoskeletal:+chronic pain syndrome  Skin: Negative for color change, pallor and rash.  Neurological: Negative for syncope, light-headedness and headaches.  Psychiatric/Behavioral: The patient is not nervous/anxious.       Objective:   Physical Exam  Constitutional: He appears well-developed and well-nourished. No distress.  HENT:  Head: Normocephalic and atraumatic.  Eyes: Pupils are equal, round, and reactive to light. Conjunctivae and EOM are normal. No scleral icterus.  Neck: Normal range of motion. Neck supple. No tracheal deviation present.  Cardiovascular: Normal rate, regular rhythm and normal heart sounds.  Pulmonary/Chest: Effort normal and breath sounds normal. No respiratory distress. He has no wheezes. He has no rales.  Abdominal: Soft. Bowel sounds are normal. There is no tenderness.  Neurological: He is alert and oriented to person, place, and time.  Musculoskeletal: Right leg above-knee amputation.  Walks with a crutch. Skin: Skin is warm and dry. He is not diaphoretic. No pallor.  Psychiatric: He has a normal mood and affect. His behavior is normal. Thought content normal.  Nursing note and vitals reviewed.  Vitals:   10/03/18 1323  BP: 140/80  Pulse: 86  Temp: 98.3 F (36.8 C)  SpO2: 98%       Assessment & Plan:   Essential hypertension- pressure remains controlled on current medications.  He will continue  losartan.  Hypothyroidism- thyroid levels remain stable on current dose of levothyroxine.  He will continue this dose.  Refill sent in.  Offered to do lab work today in clinic, but patient declines.  States he had lab work recently and his endocrinologist.  We will request these records.  High-dose flu vaccine given in clinic.  Patient agreeable to follow-up in approximately 3 months, we will plan to do complete physical exam with full panel fasting blood work at that time.  Patient aware he can return to clinic sooner if any issues arise.

## 2018-11-19 ENCOUNTER — Ambulatory Visit: Payer: Medicare Other | Admitting: Family Medicine

## 2018-12-29 ENCOUNTER — Other Ambulatory Visit: Payer: Self-pay | Admitting: Family Medicine

## 2018-12-29 DIAGNOSIS — I10 Essential (primary) hypertension: Secondary | ICD-10-CM

## 2019-02-09 ENCOUNTER — Encounter: Payer: Medicare Other | Admitting: Family Medicine

## 2019-03-21 ENCOUNTER — Emergency Department
Admission: EM | Admit: 2019-03-21 | Discharge: 2019-03-21 | Disposition: A | Discharge status: Home / Self Care | Payer: Medicare Other | Attending: Student in an Organized Health Care Education/Training Program | Admitting: Student in an Organized Health Care Education/Training Program

## 2019-03-21 ENCOUNTER — Emergency Department: Payer: Medicare Other

## 2019-03-21 ENCOUNTER — Other Ambulatory Visit: Payer: Self-pay

## 2019-03-21 DIAGNOSIS — L02414 Cutaneous abscess of left upper limb: Secondary | ICD-10-CM | POA: Insufficient documentation

## 2019-03-21 DIAGNOSIS — L0291 Cutaneous abscess, unspecified: Secondary | ICD-10-CM

## 2019-03-21 DIAGNOSIS — Z79899 Other long term (current) drug therapy: Secondary | ICD-10-CM | POA: Diagnosis not present

## 2019-03-21 DIAGNOSIS — E039 Hypothyroidism, unspecified: Secondary | ICD-10-CM | POA: Diagnosis not present

## 2019-03-21 DIAGNOSIS — L539 Erythematous condition, unspecified: Secondary | ICD-10-CM | POA: Diagnosis present

## 2019-03-21 LAB — COMPREHENSIVE METABOLIC PANEL
ALT: 21 U/L (ref 0–44)
AST: 28 U/L (ref 15–41)
Albumin: 4.7 g/dL (ref 3.5–5.0)
Alkaline Phosphatase: 67 U/L (ref 38–126)
Anion gap: 9 (ref 5–15)
BUN: 11 mg/dL (ref 8–23)
CO2: 22 mmol/L (ref 22–32)
Calcium: 9.9 mg/dL (ref 8.9–10.3)
Chloride: 107 mmol/L (ref 98–111)
Creatinine, Ser: 0.51 mg/dL — ABNORMAL LOW (ref 0.61–1.24)
GFR calc Af Amer: >60 mL/min (ref >60)
GFR calc non Af Amer: >60 mL/min (ref >60)
Glucose, Bld: 161 mg/dL — ABNORMAL HIGH (ref 70–99)
Potassium: 3.7 mmol/L (ref 3.5–5.1)
Sodium: 138 mmol/L (ref 135–145)
Total Bilirubin: 2.9 mg/dL — ABNORMAL HIGH (ref 0.3–1.2)
Total Protein: 8.2 g/dL — ABNORMAL HIGH (ref 6.5–8.1)

## 2019-03-21 LAB — CBC
HCT: 33.3 % — ABNORMAL LOW (ref 39.0–52.0)
Hemoglobin: 11.1 g/dL — ABNORMAL LOW (ref 13.0–17.0)
MCH: 32.6 pg (ref 26.0–34.0)
MCHC: 33.3 g/dL (ref 30.0–36.0)
MCV: 97.7 fL (ref 80.0–100.0)
Platelets: 65 10*3/uL — ABNORMAL LOW (ref 150–400)
RBC: 3.41 MIL/uL — ABNORMAL LOW (ref 4.22–5.81)
RDW: 18.8 % — ABNORMAL HIGH (ref 11.5–15.5)
WBC: 4.1 10*3/uL (ref 4.0–10.5)
nRBC: 0 % (ref 0.0–0.2)

## 2019-03-21 MED ORDER — TETRAHYDROZOLINE HCL 0.05 % OP SOLN: 2.0000 [drp] | Freq: Three times a day (TID) | OPHTHALMIC | 0 refills | Status: DC

## 2019-03-21 MED ORDER — FLUORESCEIN SODIUM 1 MG OP STRP
1.0000 | ORAL_STRIP | Freq: Once | OPHTHALMIC | Status: AC
  Administered 2019-03-21: 1 via OPHTHALMIC
  Filled 2019-03-21: qty 1

## 2019-03-21 MED ORDER — SULFAMETHOXAZOLE-TRIMETHOPRIM 800-160 MG PO TABS
1.0000 | ORAL_TABLET | Freq: Once | ORAL | Status: AC
  Administered 2019-03-21: 1 via ORAL
  Filled 2019-03-21: qty 1

## 2019-03-21 MED ORDER — SULFAMETHOXAZOLE-TRIMETHOPRIM 800-160 MG PO TABS: 1.0000 | ORAL_TABLET | Freq: Two times a day (BID) | ORAL | 0 refills | Status: AC

## 2019-03-21 MED ORDER — LIDOCAINE HCL (PF) 1 % IJ SOLN
5.0000 mL | Freq: Once | INTRAMUSCULAR | Status: DC
  Filled 2019-03-21: qty 5

## 2019-03-21 MED ORDER — BACITRACIN ZINC 500 UNIT/GM EX OINT
TOPICAL_OINTMENT | Freq: Once | CUTANEOUS | Status: AC
  Administered 2019-03-21: 1 via TOPICAL
  Filled 2019-03-21: qty 0.9

## 2019-03-21 NOTE — ED Notes (Signed)
Dr at bedside for I&d

## 2019-03-21 NOTE — ED Triage Notes (Signed)
Pt arrives via pov from home. Pt c/o arm infection starting 4 day ago. Thinks he may hit it against something. Wound on left anterior forearm. Around 3" open skin in area with redness extending around the area. 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 NAD noted at this time.

## 2019-03-21 NOTE — ED Notes (Signed)
Pt declined signing form for discharge

## 2019-03-21 NOTE — ED Provider Notes (Signed)
[facility] Emergency Department Provider Note    First MD Initiated Contact with Patient 03/21/19 1252     (approximate)  I have reviewed the triage vital signs and the nursing notes.   HISTORY  Chief Complaint Wound Infection    HPI [name] Derek Bonito. is a 68 y.o. male presents the ER for evaluation of wound and pain to the left forearm.  States that he thinks that he burned or hit his arm on a frying pan a few days ago and has developed worsening pain and swelling to that area.  No measured fevers.  Does have complex past medical history with a history of cirrhosis as well as staph infections.  Not currently on any antibiotics.    Past Medical History:  Diagnosis Date  . 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 ANXIETY 03/24/2007  . Chronic pain   . CIRRHOSIS 03/24/2007  . 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 FRACTURE, WRIST, LEFT 10/12/2009   Qualifier: Diagnosis of  By: Loanne Drilling MD, Jacelyn Pi   . HEPATITIS C 03/24/2007  . HYPERLIPIDEMIA 09/12/2009  . Hypocalcemia 12/23/2007   Qualifier: Diagnosis of  By: Loanne Drilling MD, Jacelyn Pi   . Pancytopenia 12/23/2007  . 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 UROLITHIASIS, HX OF 12/23/2007   Family History  Problem Relation Age of Onset  . Heart disease Mother   . Cancer Neg Hx    Past Surgical History:  Procedure Laterality Date  . ABOVE KNEE LEG AMPUTATION Right    Patient Active Problem List   Diagnosis Date Noted  . Acute hepatic encephalopathy 04/07/2018  . Hepatic encephalopathy ([place]) 04/07/2018  . Chronic pain syndrome 11/23/2016  . Hypothyroidism 09/25/2016  . 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 Cellulitis of right thigh 05/21/2012  . Pancytopenia ([place]) 02/26/2012  . Encounter for long-term (current) use of other medications 02/26/2012  . Screening for prostate cancer 02/26/2012  . Anemia  02/26/2012  . Closed fracture of carpal bone 10/12/2009  . Dyslipidemia 09/12/2009  . Phantom limb syndrome ([place]) 09/12/2009  . HYPOCALCEMIA 12/23/2007  . 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 UROLITHIASIS, HX OF 12/23/2007  . 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 HEPATITIS C 03/24/2007  . Anxiety 03/24/2007  . Hepatic cirrhosis ([place]) 03/24/2007      Prior to Admission medications   Medication Sig Start Date End Date Taking? Authorizing Provider  ALPRAZolam Duanne Moron) 1 MG tablet Take 1 mg by mouth 3 (three) times daily.      [provider]  lactulose (CHRONULAC) 10 GM/15ML solution Take 45 mLs (30 g total) by mouth 2 (two) times daily as needed for mild constipation or moderate constipation. 04/11/18   Saundra Shelling, MD  levothyroxine (SYNTHROID, LEVOTHROID) 50 MCG tablet TAKE 1 TABLET(50 MCG) BY MOUTH DAILY 10/03/18   Guse, Jacquelynn Cree, FNP  losartan (COZAAR) 100 MG tablet TAKE 1 TABLET(100 MG) BY MOUTH DAILY 12/29/18   Guse, Jacquelynn Cree, FNP  lubiprostone (AMITIZA) 24 MCG capsule daily with breakfast. Reported on 10/18/2015 08/13/14   [provider]  oxycodone (ROXICODONE) 30 MG immediate release tablet Take 1 tablet by mouth 5 (five) times daily as needed. 03/24/18   [provider]  sildenafil (VIAGRA) 100 MG tablet Take 100 mg by mouth as needed.      [provider]  sulfamethoxazole-trimethoprim (BACTRIM DS) 800-160 MG  tablet Take 1 tablet by mouth 2 (two) times daily for 7 days. 03/21/19 03/28/19  Merlyn Lot, MD  vitamin B-12 (CYANOCOBALAMIN) 1000 MCG tablet Take 1 tablet (1,000 mcg total) by mouth daily. 04/09/18   Bettey Costa, MD    Allergies Clindamycin    Social History Social History   Tobacco Use  . Smoking status: Never Smoker  . Smokeless tobacco: Never Used  Substance Use Topics  . Alcohol use: Not Currently    Alcohol/week: 0.0 standard drinks  . Drug use: No    Review of Systems Patient denies headaches, rhinorrhea, blurry  vision, numbness, shortness of breath, chest pain, edema, cough, abdominal pain, nausea, vomiting, diarrhea, dysuria, fevers, rashes or hallucinations unless otherwise stated above in HPI. ____________________________________________   PHYSICAL EXAM:  VITAL SIGNS: Vitals:   03/21/19 1205  BP: (!) 152/62  Pulse: (!) 106  Resp: 20  Temp: 98.9 F (37.2 C)  SpO2: 99%    Constitutional: Alert and oriented.  Eyes: Conjunctivae are normal. Normal woods lamp exam, no conjunctivitis.  eomi Head: Atraumatic. Nose: No congestion/rhinnorhea. Mouth/Throat: Mucous membranes are moist.   Neck: No stridor. Painless ROM.  Cardiovascular: Normal rate, regular rhythm. Grossly normal heart sounds.  Good peripheral circulation. Respiratory: Normal respiratory effort.  No retractions. Lungs CTAB. Gastrointestinal: Soft and nontender. No distention. No abdominal bruits. No CVA tenderness. Genitourinary:  Musculoskeletal: No left mid forearm volar aspect has a 1.5 cm fluctuant area consistent with abscess with some overlying cellulitis just distal to what appears to be previous abrasion her small burn.  No streaking more proximally.  No lymphadenopathy.  No crepitus.  No lower extremity tenderness nor edema.   Neurologic:  Normal speech and language. No gross focal neurologic deficits are appreciated. No facial droop Skin:  Skin is warm, dry and intact. No rash noted. Psychiatric: Mood and affect are normal. Speech and behavior are normal.  ____________________________________________   LABS (all labs ordered are listed, but only abnormal results are displayed)  Results for orders placed or performed during the hospital encounter of 03/21/19 (from the past 24 hour(s))  CBC     Status: Abnormal   Collection Time: 03/21/19 12:16 PM  Result Value Ref Range   WBC 4.1 4.0 - 10.5 K/uL   RBC 3.41 (L) 4.22 - 5.81 MIL/uL   Hemoglobin 11.1 (L) 13.0 - 17.0 g/dL   HCT 33.3 (L) 39.0 - 52.0 %   MCV 97.7 80.0  - 100.0 fL   MCH 32.6 26.0 - 34.0 pg   MCHC 33.3 30.0 - 36.0 g/dL   RDW 18.8 (H) 11.5 - 15.5 %   Platelets 65 (L) 150 - 400 K/uL   nRBC 0.0 0.0 - 0.2 %  Comprehensive metabolic panel     Status: Abnormal   Collection Time: 03/21/19 12:16 PM  Result Value Ref Range   Sodium 138 135 - 145 mmol/L   Potassium 3.7 3.5 - 5.1 mmol/L   Chloride 107 98 - 111 mmol/L   CO2 22 22 - 32 mmol/L   Glucose, Bld 161 (H) 70 - 99 mg/dL   BUN 11 8 - 23 mg/dL   Creatinine, Ser 0.51 (L) 0.61 - 1.24 mg/dL   Calcium 9.9 8.9 - 10.3 mg/dL   Total Protein 8.2 (H) 6.5 - 8.1 g/dL   Albumin 4.7 3.5 - 5.0 g/dL   AST 28 15 - 41 U/L   ALT 21 0 - 44 U/L   Alkaline Phosphatase 67 38 - 126 U/L  Total Bilirubin 2.9 (H) 0.3 - 1.2 mg/dL   GFR calc non Af Amer >60 >60 mL/min   GFR calc Af Amer >60 >60 mL/min   Anion gap 9 5 - 15   ____________________________________________  ____________________________________________  RADIOLOGY  I personally reviewed all radiographic images ordered to evaluate for the above acute complaints and reviewed radiology reports and findings.  These findings were personally discussed with the patient.  Please see medical record for radiology report.  ____________________________________________   PROCEDURES  Procedure(s) performed:  Marland KitchenMarland KitchenIncision and Drainage  Date/Time: 03/21/2019 1:25 PM Performed by: Merlyn Lot, MD Authorized by: Merlyn Lot, MD   Consent:    Consent obtained:  Verbal   Consent given by:  Patient   Risks discussed:  Bleeding, infection, incomplete drainage and pain   Alternatives discussed:  Alternative treatment, delayed treatment and observation Location:    Type:  Abscess   Location:  Upper extremity   Upper extremity location:  Arm   Arm location:  L lower arm Pre-procedure details:    Skin preparation:  Betadine Anesthesia (see MAR for exact dosages):    Anesthesia method:  Local infiltration   Local anesthetic:  Lidocaine 1% w/o epi  Procedure type:    Complexity:  Simple Procedure details:    Incision types:  Stab incision   Scalpel blade:  11   Wound management:  Probed and deloculated   Drainage amount:  Moderate   Wound treatment:  Wound left open   Packing materials:  None Post-procedure details:    Patient tolerance of procedure:  Tolerated well, no immediate complications      Critical Care performed: no ____________________________________________   INITIAL IMPRESSION / ASSESSMENT AND PLAN / ED COURSE  Pertinent labs & imaging results that were available during my care of the patient were reviewed by me and considered in my medical decision making (see chart for details).   DDX: Cellulitis, abscess, burn, abrasion, foreign body, osteo  Donne Anon [place]kn[place] Derek Bonito. is a 68 y.o. who presents to the ED with early developing abscess with some surrounding cellulitic changes to the left forearm.  Patient was also complaining some irritation and itching of his right eye.  Wood's lamp exam shows no evidence of ulcer or fluorescein uptake.  Does not seem to have any evidence of conjunctivitis.  No hyphema or hypopyon.  I&D performed on the left forearm with purulent drainage.  Wound care was provided.  Patient started on Bactrim.  He has normal renal function at this time.  Patient instructed to return to the ER or PCP in 48 hours for wound check.  Have discussed with the patient and available family all diagnostics and treatments performed thus far and all questions were answered to the best of my ability. The patient demonstrates understanding and agreement with plan.      The patient was evaluated in Emergency Department today for the symptoms described in the history of present illness. He/she was evaluated in the context of the global COVID-19 pandemic, which necessitated consideration that the patient might be at risk for infection with the SARS-CoV-2 virus that causes COVID-19. Institutional protocols and algorithms  that pertain to the evaluation of patients at risk for COVID-19 are in a state of rapid change based on information released by regulatory bodies including the CDC and federal and state organizations. These policies and algorithms were followed during the patient's care in the ED.  As part of my medical decision making, I reviewed the following data within  the electronic MEDICAL RECORD NUMBER Nursing notes reviewed and incorporated, Labs reviewed, notes from prior ED visits and [place] Controlled Substance Database   ____________________________________________   FINAL CLINICAL IMPRESSION(S) / ED DIAGNOSES  Final diagnoses:  Abscess      NEW MEDICATIONS STARTED DURING THIS VISIT:  New Prescriptions   SULFAMETHOXAZOLE-TRIMETHOPRIM (BACTRIM DS) 800-160 MG TABLET    Take 1 tablet by mouth 2 (two) times daily for 7 days.     Note:  This document was prepared using Dragon voice recognition software and may include unintentional dictation errors.    Merlyn Lot, MD 03/21/19 [phone]

## 2019-03-21 NOTE — Discharge Instructions (Addendum)
As we discussed, please change the dressing twice a day.  Please reapply triple antibiotic ointment.  Please return in 48 hours either to the ER or with your primary care physician to be evaluated for a wound recheck.  Return sooner if you develop any fevers or if you have worsening pain or redness extending beyond your elbow.

## 2019-03-25 ENCOUNTER — Other Ambulatory Visit: Payer: Self-pay

## 2019-03-25 ENCOUNTER — Other Ambulatory Visit: Payer: Self-pay | Admitting: Family Medicine

## 2019-03-25 DIAGNOSIS — I10 Essential (primary) hypertension: Secondary | ICD-10-CM

## 2019-03-25 DIAGNOSIS — E039 Hypothyroidism, unspecified: Secondary | ICD-10-CM

## 2019-03-27 ENCOUNTER — Ambulatory Visit: Payer: Medicare Other | Admitting: Physician Assistant

## 2019-05-26 ENCOUNTER — Telehealth: Payer: Self-pay | Admitting: Endocrinology

## 2019-05-26 DIAGNOSIS — E039 Hypothyroidism, unspecified: Secondary | ICD-10-CM

## 2019-05-27 NOTE — Telephone Encounter (Signed)
Please advise 

## 2019-05-27 NOTE — Telephone Encounter (Signed)
1.  Please schedule f/u appt 2.  Then please refill x 1, pending that appt.  

## 2019-05-28 ENCOUNTER — Telehealth: Payer: Self-pay

## 2019-05-28 ENCOUNTER — Other Ambulatory Visit: Payer: Self-pay | Admitting: Lab

## 2019-05-28 DIAGNOSIS — E039 Hypothyroidism, unspecified: Secondary | ICD-10-CM

## 2019-05-28 DIAGNOSIS — I10 Essential (primary) hypertension: Secondary | ICD-10-CM

## 2019-05-28 NOTE — Telephone Encounter (Signed)
Called patient to schedule appointment-patient was confused due to Dr. Loanne Hall was his PCP for years and was told that he needed to find a new PCP. Patient is seeing a PCP and will call back to schedule an appointment with Dr. Loanne Hall as a Specialist to treat him if his PCP will not treat his Thyroid condition.

## 2019-05-28 NOTE — Telephone Encounter (Signed)
Per Dr. Ellison, unable to refill Levothyroxine without an appt. Routing this message to the front desk for scheduling purposes.  

## 2019-05-28 NOTE — Telephone Encounter (Signed)
Noted  

## 2019-05-28 NOTE — Telephone Encounter (Signed)
Copied from [place] [id number]. Topic: Quick Communication - Rx Refill/Question >> May 28, 2019 11:26 AM Carolyn Stare wrote: Medication   levothyroxine (SYNTHROID) 50 MCG tablet     Pt never picked up the RX sent in in Aug please resend    losartan (COZAAR) 100 MG tablet   Preferred Pharmacy [facility]   Agent: Please be advised that RX refills may take up to 3 business days. We ask that you follow-up with your pharmacy.

## 2019-05-29 ENCOUNTER — Other Ambulatory Visit: Payer: Self-pay

## 2019-05-29 DIAGNOSIS — E039 Hypothyroidism, unspecified: Secondary | ICD-10-CM

## 2019-05-29 MED ORDER — LEVOTHYROXINE SODIUM 50 MCG PO TABS: 50.0000 ug | ORAL_TABLET | Freq: Every day | ORAL | 0 refills | Status: DC

## 2019-05-29 NOTE — Telephone Encounter (Signed)
levothyroxine (SYNTHROID) 50 MCG tablet 30 tablet 0 05/29/2019    Sig - Route: Take 1 tablet (50 mcg total) by mouth daily before breakfast. - Oral   Sent to pharmacy as: levothyroxine (SYNTHROID) 50 MCG tablet   E-Prescribing Status: Receipt confirmed by pharmacy (05/29/2019 11:26 AM EDT)

## 2019-05-29 NOTE — Telephone Encounter (Signed)
Patient scheduled 06/01/19 11:15

## 2019-06-01 ENCOUNTER — Other Ambulatory Visit: Payer: Self-pay | Admitting: Endocrinology

## 2019-06-01 ENCOUNTER — Ambulatory Visit: Payer: Medicare Other | Admitting: Endocrinology

## 2019-06-01 DIAGNOSIS — E039 Hypothyroidism, unspecified: Secondary | ICD-10-CM

## 2019-06-08 ENCOUNTER — Other Ambulatory Visit: Payer: Self-pay

## 2019-06-08 ENCOUNTER — Ambulatory Visit (INDEPENDENT_AMBULATORY_CARE_PROVIDER_SITE_OTHER): Payer: Medicare Other | Admitting: Endocrinology

## 2019-06-08 ENCOUNTER — Other Ambulatory Visit: Payer: Self-pay | Admitting: Family Medicine

## 2019-06-08 ENCOUNTER — Encounter: Payer: Self-pay | Admitting: Endocrinology

## 2019-06-08 VITALS — BP 172/70 | HR 79 | Ht 70.0 in | Wt 207.4 lb

## 2019-06-08 DIAGNOSIS — I10 Essential (primary) hypertension: Secondary | ICD-10-CM

## 2019-06-08 DIAGNOSIS — E039 Hypothyroidism, unspecified: Secondary | ICD-10-CM | POA: Diagnosis not present

## 2019-06-08 MED ORDER — LOSARTAN POTASSIUM 100 MG PO TABS: 100.0000 mg | ORAL_TABLET | Freq: Every day | ORAL | 0 refills | Status: DC

## 2019-06-08 NOTE — Telephone Encounter (Signed)
Pt called stating he only has half a pill left. Please advise as he has waited a week.

## 2019-06-08 NOTE — Progress Notes (Signed)
Subjective:    Patient ID: Derek Rough., male    DOB: Nov 04, 1950, 68 y.o.   MRN: [id number]  HPI Pt returns for f/u of chronic primary hypothyroidism (dx'ed 2015; he has been on synthroid since then; he has never had thyroid imaging).   pt states he feels well in general.   Past Medical History:  Diagnosis Date  . 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 ANXIETY 03/24/2007  . Chronic pain   . CIRRHOSIS 03/24/2007  . 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 FRACTURE, WRIST, LEFT 10/12/2009   Qualifier: Diagnosis of  By: Loanne Drilling MD, Jacelyn Pi   . HEPATITIS C 03/24/2007  . HYPERLIPIDEMIA 09/12/2009  . Hypocalcemia 12/23/2007   Qualifier: Diagnosis of  By: Loanne Drilling MD, Jacelyn Pi   . Pancytopenia 12/23/2007  . 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 UROLITHIASIS, HX OF 12/23/2007    Past Surgical History:  Procedure Laterality Date  . ABOVE KNEE LEG AMPUTATION Right     Social History   Socioeconomic History  . Marital status: Widowed    Spouse name: Not on file  . Number of children: Not on file  . Years of education: Not on file  . Highest education level: Not on file  Occupational History  . Occupation: Disabled  Social Needs  . Financial resource strain: Not on file  . Food insecurity    Worry: Not on file    Inability: Not on file  . Transportation needs    Medical: Not on file    Non-medical: Not on file  Tobacco Use  . Smoking status: Never Smoker  . Smokeless tobacco: Never Used  Substance and Sexual Activity  . Alcohol use: Not Currently    Alcohol/week: 0.0 standard drinks  . Drug use: No  . Sexual activity: Not on file  Lifestyle  . Physical activity    Days per week: Not on file    Minutes per session: Not on file  . Stress: Not on file  Relationships  . Social Herbalist on phone: Not on file    Gets together: Not on file    Attends religious service: Not on file    Active member of club or  organization: Not on file    Attends meetings of clubs or organizations: Not on file    Relationship status: Not on file  . Intimate partner violence    Fear of current or ex partner: Not on file    Emotionally abused: Not on file    Physically abused: Not on file    Forced sexual activity: Not on file  Other Topics Concern  . Not on file  Social History Narrative   Widowed 1998   Lives alone    Current Outpatient Medications on File Prior to Visit  Medication Sig Dispense Refill  . ALPRAZolam (XANAX) 1 MG tablet Take 1 mg by mouth 3 (three) times daily.      Marland Kitchen levothyroxine (SYNTHROID) 50 MCG tablet Take 1 tablet (50 mcg total) by mouth daily before breakfast. 30 tablet 0  . losartan (COZAAR) 100 MG tablet TAKE 1 TABLET(100 MG) BY MOUTH DAILY 90 tablet 0  . lubiprostone (AMITIZA) 24 MCG capsule Take 24 mcg by mouth daily as needed.     Marland Kitchen NARCAN 4 MG/0.1ML LIQD nasal spray kit SPRAY 0.1 ML (4 MG) IN 1 NOSTRIL/ MAY REPEAT DOSE EVERY 2 3 MINUTES AS  NEEDED ALTERNATING NOSTRILS    . oxycodone (ROXICODONE) 30 MG immediate release tablet Take 1 tablet by mouth 5 (five) times daily as needed.    Marland Kitchen losartan (COZAAR) 100 MG tablet Take 1 tablet (100 mg total) by mouth daily. 30 tablet 0  . vitamin B-12 (CYANOCOBALAMIN) 1000 MCG tablet Take 1 tablet (1,000 mcg total) by mouth daily. (Patient not taking: Reported on 06/08/2019)     No current facility-administered medications on file prior to visit.     Allergies  Allergen Reactions  . Clindamycin Dermatitis    Family History  Problem Relation Age of Onset  . Heart disease Mother   . Cancer Neg Hx     BP (!) 172/70 (BP Location: Left Arm, Patient Position: Sitting, Cuff Size: Normal)   Pulse 79   Ht 5' 10"  (1.778 m)   Wt 207 lb 6.4 oz (94.1 kg)   SpO2 97%   BMI 29.76 kg/m    Review of Systems Denies weight change.      Objective:   Physical Exam VITAL SIGNS:  See vs page GENERAL: no distress NECK: There is no palpable  thyroid enlargement.  No thyroid nodule is palpable.  No palpable lymphadenopathy at the anterior neck.        Assessment & Plan:  HTN: is noted today Hypothyroidism: recheck today  Patient Instructions  Your blood pressure is high today.  Please see a primary care provider soon, to have it rechecked Blood tests are requested for you today.  We'll let you know about the results.  Please come back for a follow-up appointment in 1 year.

## 2019-06-08 NOTE — Telephone Encounter (Signed)
Spoke to [facility] pharmacy staff who confirmed that pt filled Rx for losartan on 03/26/19. This Rx was for 90 tabs.  Pt should have meds left until the end of the November.  Pt is saying he only has 1 pill left.  Do you want to refill?  Also, pt requesting a refill on Sythriod.  This was refilled for 30 tabs on 05/29/19 and is prescribed by another provider.  Please advise.

## 2019-06-08 NOTE — Telephone Encounter (Signed)
Patient said that he has already contacted Dr. Loanne Drilling to get a refill on the Synthroid.  Pt aware that 30 supply of losartan has been sent in.  Pt scheduled a medication refill appt with Philis Nettle, FNP on 06/15/19 @ 8:20 am.

## 2019-06-08 NOTE — Telephone Encounter (Signed)
If synthroid filled by another provider he should contact them  I will do 30 days of losartan.  He has not been seen since March of 2020 so he either needs to come in before I leave on 06/17/19 to see me or be scheduled at some point with another provider  Thanks  LG

## 2019-06-08 NOTE — Patient Instructions (Addendum)
Your blood pressure is high today.  Please see a primary care provider soon, to have it rechecked Blood tests are requested for you today.  We'll let you know about the results.  Please come back for a follow-up appointment in 1 year.

## 2019-06-08 NOTE — Telephone Encounter (Signed)
Pt is calling and needs a refill on losartan. [facility]

## 2019-06-15 ENCOUNTER — Ambulatory Visit (INDEPENDENT_AMBULATORY_CARE_PROVIDER_SITE_OTHER): Payer: Medicare Other | Admitting: Family Medicine

## 2019-06-15 ENCOUNTER — Encounter: Payer: Self-pay | Admitting: Family Medicine

## 2019-06-15 ENCOUNTER — Other Ambulatory Visit: Payer: Self-pay

## 2019-06-15 VITALS — Ht 70.0 in | Wt 207.0 lb

## 2019-06-15 DIAGNOSIS — E039 Hypothyroidism, unspecified: Secondary | ICD-10-CM | POA: Diagnosis not present

## 2019-06-15 DIAGNOSIS — G894 Chronic pain syndrome: Secondary | ICD-10-CM

## 2019-06-15 DIAGNOSIS — I10 Essential (primary) hypertension: Secondary | ICD-10-CM

## 2019-06-15 MED ORDER — HYDROCHLOROTHIAZIDE 12.5 MG PO TABS: 12.5000 mg | ORAL_TABLET | Freq: Every day | ORAL | 3 refills | Status: DC

## 2019-06-15 NOTE — Progress Notes (Signed)
Patient ID: [name]., male   DOB: May 22, 1951, 68 y.o.   MRN: [id number]    Virtual Visit via ph Note  This visit type was conducted due to national recommendations for restrictions regarding the COVID-19 pandemic (e.g. social distancing).  This format is felt to be most appropriate for this patient at this time.  All issues noted in this document were discussed and addressed.  No physical exam was performed (except for noted visual exam findings with Video Visits).   I connected with Takuma Hornstein today at  8:20 AM EST by a video enabled telemedicine application or telephone and verified that I am speaking with the correct person using two identifiers. Location patient: home Location provider: work or home office Persons participating in the virtual visit: patient, provider  I discussed the limitations, risks, security and privacy concerns of performing an evaluation and management service by telephone and the availability of in person appointments. I also discussed with the patient that there may be a patient responsible charge related to this service. The patient expressed understanding and agreed to proceed.  HPI:  Patient and I connected via telephonic to follow-up on blood pressure.  Patient states she has been taking losartan without any problems with the medicine, tolerating this medicine well for many years.  States at his endocrinology appointment last week his BP was elevated in the 170s over 80s and at home it usually runs in the 150s over 80s.  Denies chest pain, palpitations or any swelling in extremities.  Patient states they were concerned that his blood pressure was running more high.  Follows with endocrinology for hypothyroidism.  Does see chronic pain management for his multiple chronic pain issues.  Has not had lab work in the past many months, last was in office in March 2020   ROS: See pertinent positives and negatives per HPI.  Past Medical History:   Diagnosis Date  . 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 ANXIETY 03/24/2007  . Chronic pain   . CIRRHOSIS 03/24/2007  . 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 FRACTURE, WRIST, LEFT 10/12/2009   Qualifier: Diagnosis of  By: Loanne Drilling MD, Jacelyn Pi   . HEPATITIS C 03/24/2007  . HYPERLIPIDEMIA 09/12/2009  . Hypocalcemia 12/23/2007   Qualifier: Diagnosis of  By: Loanne Drilling MD, Jacelyn Pi   . Pancytopenia 12/23/2007  . 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 UROLITHIASIS, HX OF 12/23/2007    Past Surgical History:  Procedure Laterality Date  . ABOVE KNEE LEG AMPUTATION Right     Family History  Problem Relation Age of Onset  . Heart disease Mother   . Cancer Neg Hx    Social History   Tobacco Use  . Smoking status: Never Smoker  . Smokeless tobacco: Never Used  Substance Use Topics  . Alcohol use: Not Currently    Alcohol/week: 0.0 standard drinks     Current Outpatient Medications:  .  ALPRAZolam (XANAX) 1 MG tablet, Take 1 mg by mouth 3 (three) times daily.  , Disp: , Rfl:  .  levothyroxine (SYNTHROID) 50 MCG tablet, Take 1 tablet (50 mcg total) by mouth daily before breakfast., Disp: 30 tablet, Rfl: 0 .  losartan (COZAAR) 100 MG tablet, TAKE 1 TABLET(100 MG) BY MOUTH DAILY, Disp: 90 tablet, Rfl: 1 .  lubiprostone (AMITIZA) 24 MCG capsule, Take 24 mcg by mouth daily as needed. , Disp: , Rfl:  .  NARCAN 4  MG/0.1ML LIQD nasal spray kit, SPRAY 0.1 ML (4 MG) IN 1 NOSTRIL/ MAY REPEAT DOSE EVERY 2 3 MINUTES AS NEEDED ALTERNATING NOSTRILS, Disp: , Rfl:  .  oxycodone (ROXICODONE) 30 MG immediate release tablet, Take 1 tablet by mouth 5 (five) times daily as needed., Disp: , Rfl:  .  vitamin B-12 (CYANOCOBALAMIN) 1000 MCG tablet, Take 1 tablet (1,000 mcg total) by mouth daily., Disp: , Rfl:  .  hydrochlorothiazide (HYDRODIURIL) 12.5 MG tablet, Take 1 tablet (12.5 mg total) by mouth daily., Disp: 90 tablet, Rfl: 3  EXAM:  GENERAL: alert, oriented, sounds  well and in no acute distress  LUNGS: speaking in full sentences, no signs of respiratory distress, breathing rate appears normal, no obvious gross SOB, gasping or wheezing  PSYCH/NEURO: pleasant and cooperative, no obvious depression or anxiety, speech and thought processing grossly intact  ASSESSMENT AND PLAN:  Discussed the following assessment and plan:  Essential hypertension - Plan: hydrochlorothiazide (HYDRODIURIL) 12.5 MG tablet, Basic metabolic panel, Lipid panel, CBC  Hypothyroidism, unspecified type - Plan: Lipid panel  Chronic pain syndrome  Patient will come into clinic the next few weeks for blood work.  We will also add hydrochlorothiazide on top of losartan for better overall BP control.  In 2 weeks when he comes in for lab work we will plan to do nurse visit blood pressure check as well.  He also needs a flu shot for this season.   I discussed the assessment and treatment plan with the patient. The patient was provided an opportunity to ask questions and all were answered. The patient agreed with the plan and demonstrated an understanding of the instructions.   The patient was advised to call back or seek an in-person evaluation if the symptoms worsen or if the condition fails to improve as anticipated.  I provided 15 minutes of non-face-to-face time during this encounter.   Jodelle Green, FNP

## 2019-06-27 ENCOUNTER — Other Ambulatory Visit: Payer: Self-pay

## 2019-06-27 ENCOUNTER — Emergency Department: Payer: Medicare Other

## 2019-06-27 ENCOUNTER — Emergency Department
Admission: EM | Admit: 2019-06-27 | Discharge: 2019-06-28 | Disposition: A | Discharge status: Home / Self Care | Payer: Medicare Other | Attending: Student | Admitting: Student

## 2019-06-27 ENCOUNTER — Encounter: Payer: Self-pay | Admitting: Emergency Medicine

## 2019-06-27 DIAGNOSIS — Z89611 Acquired absence of right leg above knee: Secondary | ICD-10-CM | POA: Insufficient documentation

## 2019-06-27 DIAGNOSIS — G9340 Encephalopathy, unspecified: Secondary | ICD-10-CM

## 2019-06-27 DIAGNOSIS — E039 Hypothyroidism, unspecified: Secondary | ICD-10-CM | POA: Insufficient documentation

## 2019-06-27 DIAGNOSIS — R4182 Altered mental status, unspecified: Secondary | ICD-10-CM | POA: Diagnosis present

## 2019-06-27 DIAGNOSIS — Z79899 Other long term (current) drug therapy: Secondary | ICD-10-CM | POA: Diagnosis not present

## 2019-06-27 LAB — CBC WITH DIFFERENTIAL/PLATELET
Abs Immature Granulocytes: 0.05 10*3/uL (ref 0.00–0.07)
Basophils Absolute: 0 10*3/uL (ref 0.0–0.1)
Basophils Relative: 1 %
Eosinophils Absolute: 0.2 10*3/uL (ref 0.0–0.5)
Eosinophils Relative: 4 %
HCT: 31.4 % — ABNORMAL LOW (ref 39.0–52.0)
Hemoglobin: 10.4 g/dL — ABNORMAL LOW (ref 13.0–17.0)
Immature Granulocytes: 1 %
Lymphocytes Relative: 35 %
Lymphs Abs: 1.3 10*3/uL (ref 0.7–4.0)
MCH: 32.6 pg (ref 26.0–34.0)
MCHC: 33.1 g/dL (ref 30.0–36.0)
MCV: 98.4 fL (ref 80.0–100.0)
Monocytes Absolute: 0.4 10*3/uL (ref 0.1–1.0)
Monocytes Relative: 9 %
Neutro Abs: 1.9 10*3/uL (ref 1.7–7.7)
Neutrophils Relative %: 50 %
Platelets: 56 10*3/uL — ABNORMAL LOW (ref 150–400)
RBC: 3.19 MIL/uL — ABNORMAL LOW (ref 4.22–5.81)
RDW: 18.7 % — ABNORMAL HIGH (ref 11.5–15.5)
WBC: 3.8 10*3/uL — ABNORMAL LOW (ref 4.0–10.5)
nRBC: 0 % (ref 0.0–0.2)

## 2019-06-27 LAB — URINALYSIS, COMPLETE (UACMP) WITH MICROSCOPIC
Bacteria, UA: NONE SEEN
Bilirubin Urine: NEGATIVE
Glucose, UA: NEGATIVE mg/dL
Hgb urine dipstick: NEGATIVE
Ketones, ur: NEGATIVE mg/dL
Leukocytes,Ua: NEGATIVE
Nitrite: NEGATIVE
Protein, ur: NEGATIVE mg/dL
Specific Gravity, Urine: 1.018 (ref 1.005–1.030)
pH: 5 (ref 5.0–8.0)

## 2019-06-27 LAB — COMPREHENSIVE METABOLIC PANEL
ALT: 21 U/L (ref 0–44)
AST: 28 U/L (ref 15–41)
Albumin: 4.5 g/dL (ref 3.5–5.0)
Alkaline Phosphatase: 66 U/L (ref 38–126)
Anion gap: 11 (ref 5–15)
BUN: 22 mg/dL (ref 8–23)
CO2: 29 mmol/L (ref 22–32)
Calcium: 9.5 mg/dL (ref 8.9–10.3)
Chloride: 102 mmol/L (ref 98–111)
Creatinine, Ser: 0.89 mg/dL (ref 0.61–1.24)
GFR calc Af Amer: >60 mL/min (ref >60)
GFR calc non Af Amer: >60 mL/min (ref >60)
Glucose, Bld: 96 mg/dL (ref 70–99)
Potassium: 3.7 mmol/L (ref 3.5–5.1)
Sodium: 142 mmol/L (ref 135–145)
Total Bilirubin: 2.1 mg/dL — ABNORMAL HIGH (ref 0.3–1.2)
Total Protein: 8.1 g/dL (ref 6.5–8.1)

## 2019-06-27 LAB — URINE DRUG SCREEN, QUALITATIVE (ARMC ONLY)
Amphetamines, Ur Screen: NOT DETECTED
Barbiturates, Ur Screen: NOT DETECTED
Benzodiazepine, Ur Scrn: POSITIVE — AB
Cannabinoid 50 Ng, Ur ~~LOC~~: NOT DETECTED
Cocaine Metabolite,Ur ~~LOC~~: NOT DETECTED
MDMA (Ecstasy)Ur Screen: NOT DETECTED
Methadone Scn, Ur: NOT DETECTED
Opiate, Ur Screen: POSITIVE — AB
Phencyclidine (PCP) Ur S: NOT DETECTED
Tricyclic, Ur Screen: NOT DETECTED

## 2019-06-27 LAB — SALICYLATE LEVEL: Salicylate Lvl: <7 mg/dL (ref 2.8–30.0)

## 2019-06-27 LAB — ETHANOL: Alcohol, Ethyl (B): <10 mg/dL (ref <10)

## 2019-06-27 LAB — ACETAMINOPHEN LEVEL: Acetaminophen (Tylenol), Serum: <10 ug/mL — ABNORMAL LOW (ref 10–30)

## 2019-06-27 NOTE — ED Notes (Signed)
[name], [phone], waiting to come in

## 2019-06-27 NOTE — ED Notes (Signed)
Patient transported to CT 

## 2019-06-27 NOTE — ED Notes (Signed)
Pt back from CT, son and 1st RN called for family back

## 2019-06-27 NOTE — ED Notes (Addendum)
Pt Sats 85%, 2 lpm [place] added   son at bedside updated, reports pt has no hx of abuse, "christian man" and has been treating chronic pain since right leg amputation (d/t

## 2019-06-27 NOTE — ED Triage Notes (Signed)
Patient presents to Emergency Department via [facility] from home (with son) with complaints of difficulty arousing and repeating speech.   Pt has right leg amputation but when asked about that pt repeatedly reports eye injury to left eye, pt reports "I know I'm repeating myself."  History of HTN  Right pupil constricted, empty bottle of alprazolam and hydrocodone (count 5 filled 10/21)

## 2019-06-27 NOTE — ED Provider Notes (Signed)
[facility] Emergency Department Provider Note  ____________________________________________   First MD Initiated Contact with Patient 06/27/19 2113     (approximate)  I have reviewed the triage vital signs and the nursing notes.  History  Chief Complaint Altered Mental Status and possible overdose    HPI [name] Mia [name]. is a 68 y.o. male with history of cirrhosis, hepatitis C, chronic pain, high AKA of RLE who presents to the ER for AMS and possible accidental overdose.  EMS was called by the patient's son, who stated the patient was difficult to arouse, not making sense, repeating himself.  Patient is on chronic opiates as well as benzodiazepines.  Question accidental overdose, though patient denies any purposeful or intentional medication misuse.  Moving all extremities equally.  Oriented to self, birthdate, but repetitive.  Answers further questions with his birthdate.  Caveat: history limited due to patient's AMS.   Past Medical Hx Past Medical History:  Diagnosis Date  . 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 ANXIETY 03/24/2007  . Chronic pain   . CIRRHOSIS 03/24/2007  . 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 FRACTURE, WRIST, LEFT 10/12/2009   Qualifier: Diagnosis of  By: Loanne Drilling MD, Jacelyn Pi   . HEPATITIS C 03/24/2007  . HYPERLIPIDEMIA 09/12/2009  . Hypocalcemia 12/23/2007   Qualifier: Diagnosis of  By: Loanne Drilling MD, Jacelyn Pi   . Pancytopenia 12/23/2007  . 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 UROLITHIASIS, HX OF 12/23/2007    Problem List Patient Active Problem List   Diagnosis Date Noted  . Acute hepatic encephalopathy 04/07/2018  . Hepatic encephalopathy ([place]) 04/07/2018  . Chronic pain syndrome 11/23/2016  . Hypothyroidism 09/25/2016  . 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[place]) 04/14/2015  . Cellulitis of right thigh 05/21/2012  . Pancytopenia  ([place]) 02/26/2012  . Encounter for long-term (current) use of other medications 02/26/2012  . Screening for prostate cancer 02/26/2012  . Anemia 02/26/2012  . Closed fracture of carpal bone 10/12/2009  . Dyslipidemia 09/12/2009  . Phantom limb syndrome ([place]) 09/12/2009  . HYPOCALCEMIA 12/23/2007  . 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 UROLITHIASIS, HX OF 12/23/2007  . 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 HEPATITIS C 03/24/2007  . Anxiety 03/24/2007  . Hepatic cirrhosis ([place]) 03/24/2007    Past Surgical Hx Past Surgical History:  Procedure Laterality Date  . ABOVE KNEE LEG AMPUTATION Right     Medications Prior to Admission medications   Medication Sig Start Date End Date Taking? Authorizing Provider  ALPRAZolam Duanne Moron) 1 MG tablet Take 1 mg by mouth 3 (three) times daily.      [provider]  hydrochlorothiazide (HYDRODIURIL) 12.5 MG tablet Take 1 tablet (12.5 mg total) by mouth daily. 06/15/19   Jodelle Green, FNP  levothyroxine (SYNTHROID) 50 MCG tablet Take 1 tablet (50 mcg total) by mouth daily before breakfast. 05/29/19   Renato Shin, MD  losartan (COZAAR) 100 MG tablet TAKE 1 TABLET(100 MG) BY MOUTH DAILY 06/09/19   Guse, Jacquelynn Cree, FNP  lubiprostone (AMITIZA) 24 MCG capsule Take 24 mcg by mouth daily as needed.  08/13/14   [provider]  NARCAN 4 MG/0.1ML LIQD nasal spray kit SPRAY 0.1 ML (4 MG) IN 1 NOSTRIL/ MAY REPEAT DOSE EVERY 2 3 MINUTES AS NEEDED ALTERNATING NOSTRILS 12/29/18   [provider]  oxycodone (ROXICODONE) 30 MG immediate release tablet Take 1  tablet by mouth 5 (five) times daily as needed. 03/24/18   [provider]  vitamin B-12 (CYANOCOBALAMIN) 1000 MCG tablet Take 1 tablet (1,000 mcg total) by mouth daily. 04/09/18   Bettey Costa, MD    Allergies Clindamycin  Family Hx Family History  Problem Relation Age of Onset  . Heart disease Mother   . Cancer Neg Hx     Social Hx Social History   Tobacco Use  .  Smoking status: Never Smoker  . Smokeless tobacco: Never Used  Substance Use Topics  . Alcohol use: Not Currently    Alcohol/week: 0.0 standard drinks  . Drug use: No     Review of Systems Unable to obtain 2/2 AMS.   Physical Exam  Vital Signs: ED Triage Vitals  Enc Vitals Group     BP 06/27/19 2131 (!) 170/89     Pulse Rate 06/27/19 2131 (!) 101     Resp 06/27/19 2131 20     Temp 06/27/19 2131 98.1 F (36.7 C)     Temp Source 06/27/19 2131 Oral     SpO2 06/27/19 2131 100 %     Weight 06/27/19 2132 200 lb (90.7 kg)     Height 06/27/19 2132 _0  (1.702 m)     Head Circumference --      Peak Flow --      Pain Score 06/27/19 2132 0     Pain Loc --      Pain Edu? --      Excl. in [place]? --     Constitutional: Alert. Drowsy but arouses easily to voice. Oriented to self, birthday, then answers any further questions with his birthday repeatedly.  Head: Normocephalic. Atraumatic. Eyes: R pupil 1-2 mm, pinpoint. L pupil irregular 2/2 prior eye surgery.  Nose: No congestion. No rhinorrhea. Mouth/Throat: Wearing mask.  Neck: No stridor.   Cardiovascular: Mild tachycardia, HR low 100s. Extremities well perfused. Respiratory: Normal respiratory effort.  Lungs CTAB. Placed on [place] when sleeping. No apnea.  Gastrointestinal: Soft. Non-tender. Non-distended.  Musculoskeletal: S/p high AKA on right.  Neurologic:  Moves extremities equally. No focal deficits.  Oriented to self, birthday.  Then any further questions are answered with his birthday. Repetitive. Skin: Skin is warm, dry and intact. No rash noted. Psychiatric: Mood and affect are appropriate for situation.  EKG  N/A    Radiology  XR: IMPRESSION:  No active disease.   CT: IMPRESSION:  1. No acute intracranial abnormality. No acute orbital abnormality.  Prior left lens extraction.  2. Brain atrophy and chronic microvascular angiopathy.    Procedures  Procedure(s) performed (including critical care):   Procedures   Initial Impression / Assessment and Plan / ED Course  68 y.o. male who presents to the ED for AMS as above.  Ddx: accidental overdose/medication misuse, hepatic encephalopathy, infection, intracranial etiology, electrolyte abnormality  Will obtain labs, urine studies, imaging, continue to monitor.  At this point, patient is drowsy, but arouses easily to voice.  Placed on [place] for oxygen while sleeping, but no evidence of apnea or respiratory distress.  No indication for Narcan at this time.  CT head negative.  Electrolytes without actionable derangements.  UA negative for infection.  Awaiting UDS and pneumonia.  Remainder of work-up is unremarkable, suspect likely accidental overdose/medication misuse, and plan to observe until return to baseline.   Final Clinical Impression(s) / ED Diagnosis  Final diagnoses:  Encephalopathy       Note:  This document was prepared using  [employer] education [employer] and may include unintentional dictation errors.   Lilia Pro., MD 06/27/19 [phone]

## 2019-06-28 LAB — AMMONIA: Ammonia: 42 umol/L — ABNORMAL HIGH (ref 9–35)

## 2019-06-28 NOTE — ED Notes (Signed)
Peripheral IV discontinued. Catheter intact. No signs of infiltration or redness. Gauze applied to IV site.   Discharge instructions reviewed with patient. Questions fielded by this RN. Patient verbalizes understanding of instructions. Patient discharged home in stable condition per brown. No acute distress noted at time of discharge.   Pt wheeled to car and loaded; DC discussed with son; POC to document pt's meds taken discussed

## 2019-07-02 ENCOUNTER — Other Ambulatory Visit: Payer: Self-pay | Admitting: Endocrinology

## 2019-07-02 DIAGNOSIS — E039 Hypothyroidism, unspecified: Secondary | ICD-10-CM

## 2019-07-14 ENCOUNTER — Other Ambulatory Visit: Payer: Self-pay

## 2019-07-14 DIAGNOSIS — I10 Essential (primary) hypertension: Secondary | ICD-10-CM

## 2019-07-14 MED ORDER — LOSARTAN POTASSIUM 100 MG PO TABS: ORAL_TABLET | ORAL | 1 refills | Status: DC

## 2019-08-04 ENCOUNTER — Encounter (INDEPENDENT_AMBULATORY_CARE_PROVIDER_SITE_OTHER): Payer: Self-pay

## 2019-08-04 ENCOUNTER — Other Ambulatory Visit: Payer: Self-pay

## 2019-08-04 ENCOUNTER — Telehealth: Payer: Self-pay | Admitting: Endocrinology

## 2019-08-04 ENCOUNTER — Ambulatory Visit (INDEPENDENT_AMBULATORY_CARE_PROVIDER_SITE_OTHER): Payer: Medicare Other

## 2019-08-04 VITALS — Ht 67.0 in | Wt 200.0 lb

## 2019-08-04 DIAGNOSIS — Z0000 Encounter for general adult medical examination without abnormal findings: Secondary | ICD-10-CM | POA: Diagnosis not present

## 2019-08-04 NOTE — Telephone Encounter (Signed)
Pt did not have labs completed as ordered from 06/08/19 OV. Per Dr. Loanne Drilling, unable to refill Levothyroxine without current labs. Routing this message to the front desk for scheduling purposes.

## 2019-08-04 NOTE — Telephone Encounter (Signed)
Re: leothyroxine: 1.  Please have the blood test done 2.  Then please refill x 1, pending that appt.

## 2019-08-04 NOTE — Progress Notes (Addendum)
Subjective:   [name]. is a 69 y.o. male who presents for Medicare Annual/Subsequent preventive examination.  Review of Systems:  No ROS.  Medicare Wellness Virtual Visit.  Visual/audio telehealth visit, UTA vital signs. Wt/Ht provided.    See social history for additional risk factors.   Cardiac Risk Factors include: advanced age (>33mn, >>55women);male gender     Objective:    Vitals: Ht 5' 7"  (1.702 m)   Wt 200 lb (90.7 kg)   BMI 31.32 kg/m   Body mass index is 31.32 kg/m.  Advanced Directives 08/04/2019 06/27/2019 03/21/2019 04/07/2018 04/07/2018 04/04/2018 09/25/2016  Does Patient Have a Medical Advance Directive? No No Yes No No No No  Type of Advance Directive - - Living will - - - -  Does patient want to make changes to medical advance directive? No - Patient declined - - - - - -  Would patient like information on creating a medical advance directive? - No - Patient declined No - Patient declined No - Patient declined No - Patient declined No - Patient declined -    Tobacco Social History   Tobacco Use  Smoking Status Never Smoker  Smokeless Tobacco Never Used     Counseling given: Not Answered   Clinical Intake:  Pre-visit preparation completed: Yes        Diabetes: No  How often do you need to have someone help you when you read instructions, pamphlets, or other written materials from your doctor or pharmacy?: 1 - Never  Interpreter Needed?: No     Past Medical History:  Diagnosis Date  . AKA, RIGHT, HX OF 12/23/2007   Qualifier: Diagnosis of  By: ELoanne DrillingMD, SJacelyn Pi  . ANXIETY 03/24/2007  . Chronic pain   . CIRRHOSIS 03/24/2007  . 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 FRACTURE, WRIST, LEFT 10/12/2009   Qualifier: Diagnosis of  By: ELoanne DrillingMD, SJacelyn Pi  . HEPATITIS C 03/24/2007  . HYPERLIPIDEMIA 09/12/2009  . Hypocalcemia 12/23/2007   Qualifier: Diagnosis of  By: ELoanne DrillingMD, SJacelyn Pi  . Pancytopenia 12/23/2007  . 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 UROLITHIASIS, HX OF 12/23/2007   Past Surgical History:  Procedure Laterality Date  . ABOVE KNEE LEG AMPUTATION Right    Family History  Problem Relation Age of Onset  . Heart disease Mother   . Cancer Neg Hx    Social History   Socioeconomic History  . Marital status: Widowed    Spouse name: Not on file  . Number of children: Not on file  . Years of education: Not on file  . Highest education level: Not on file  Occupational History  . Occupation: Disabled  Tobacco Use  . Smoking status: Never Smoker  . Smokeless tobacco: Never Used  Substance and Sexual Activity  . Alcohol use: Not Currently    Alcohol/week: 0.0 standard drinks  . Drug use: No  . Sexual activity: Not on file  Other Topics Concern  . Not on file  Social History Narrative   Widowed 1998   Lives alone   Social Determinants of Health   Financial Resource Strain:   . Difficulty of Paying Living Expenses: Not on file  Food Insecurity:   . Worried About RCharity fundraiserin the Last Year: Not on file  . Ran Out of Food in the Last Year: Not on file  Transportation Needs:   . Lack of Transportation (Medical): Not on file  .  Lack of Transportation (Non-Medical): Not on file  Physical Activity:   . Days of Exercise per Week: Not on file  . Minutes of Exercise per Session: Not on file  Stress:   . Feeling of Stress : Not on file  Social Connections:   . Frequency of Communication with Friends and Family: Not on file  . Frequency of Social Gatherings with Friends and Family: Not on file  . Attends Religious Services: Not on file  . Active Member of Clubs or Organizations: Not on file  . Attends Archivist Meetings: Not on file  . Marital Status: Not on file    Outpatient Encounter Medications as of 08/04/2019  Medication Sig  . ALPRAZolam (XANAX) 1 MG tablet Take 1 mg by mouth 3 (three) times daily.    . hydrochlorothiazide (HYDRODIURIL) 12.5 MG tablet Take 1 tablet (12.5 mg  total) by mouth daily.  Marland Kitchen levothyroxine (SYNTHROID) 50 MCG tablet TAKE 1 TABLET(50 MCG) BY MOUTH DAILY BEFORE BREAKFAST  . losartan (COZAAR) 100 MG tablet TAKE 1 TABLET(100 MG) BY MOUTH DAILY  . lubiprostone (AMITIZA) 24 MCG capsule Take 24 mcg by mouth daily as needed.   Marland Kitchen NARCAN 4 MG/0.1ML LIQD nasal spray kit SPRAY 0.1 ML (4 MG) IN 1 NOSTRIL/ MAY REPEAT DOSE EVERY 2 3 MINUTES AS NEEDED ALTERNATING NOSTRILS  . oxycodone (ROXICODONE) 30 MG immediate release tablet Take 1 tablet by mouth 5 (five) times daily as needed.  . vitamin B-12 (CYANOCOBALAMIN) 1000 MCG tablet Take 1 tablet (1,000 mcg total) by mouth daily.   No facility-administered encounter medications on file as of 08/04/2019.    Activities of Daily Living In your present state of health, do you have any difficulty performing the following activities: 08/04/2019  Hearing? N  Vision? N  Difficulty concentrating or making decisions? N  Walking or climbing stairs? Y  Comment Unsteady gait; wheelchair, crutches, scooter in use  Dressing or bathing? Y  Comment Assistance as needed  Doing errands, shopping? N  Preparing Food and eating ? N  Using the Toilet? N  In the past six months, have you accidently leaked urine? N  Do you have problems with loss of bowel control? N  Managing your Medications? N  Managing your Finances? N  Housekeeping or managing your Housekeeping? Y  Comment Maid assist  Some recent data might be hidden    Patient Care Team: Jodelle Green, FNP as PCP - General (Family Medicine) Sable Feil, MD as Attending Physician (Gastroenterology) Shanon Ace, MD as Referring Physician (Anesthesiology) Jari Pigg, MD as Attending Physician (Dermatology) Mahalia Longest (Internal Medicine) Norma Fredrickson, MD as Consulting Physician (Psychiatry)   Assessment:   This is a routine wellness examination for [name].  Nurse connected with patient 08/04/19 at  9:30 AM EST by a telephone enabled  telemedicine application and verified that I am speaking with the correct person using two identifiers. Patient stated full name and DOB. Patient gave permission to continue with virtual visit. Patient's location was at home and Nurse's location was at [place] office.   Patient is alert and oriented x3. Patient denies difficulty focusing or concentrating. Patient likes to talk with others and spends time online for brain stimulation.  Health Maintenance Due: -Tdap, Shingles, Influenza vaccine- postponed per patient preference. He plans to complete at later date with doctor in visit or local pharmacy.   -Colonoscopy- declined.  See completed HM at the end of note.   Eye: Visual acuity not assessed. Virtual visit.  Dental: Dentures- yes  Hearing: Demonstrates normal hearing during visit.  Safety:  Patient feels safe at home- yes Patient does have smoke detectors at home- yes Patient does wear sunscreen or protective clothing when in direct sunlight - yes Patient does wear seat belt when in a moving vehicle - yes Patient drives- yes Adequate lighting in walkways free from debris- yes Grab bars and handrails used as appropriate- yes Ambulates with an assistive device- yes; wheelchair as needed, scooter, crutches. Cell phone on person when ambulating outside of the home- yes  Social: Alcohol intake - no      Smoking history- never   Smokers in home? none Illicit drug use? none  Medication: Taking as directed and without issues.  Pill box in use -yes  Self managed - yes   Covid-19: Precautions and sickness symptoms discussed. Wears mask, social distancing, hand hygiene as appropriate.   Activities of Daily Living Patient denies needing assistance with: feeding themselves, getting from bed to chair, getting to the toilet, dressing, managing money, or preparing meals.  Assisted with housekeeping, bathing/dressing as needed.   Discussed the importance of a healthy diet, water  intake and the benefits of aerobic exercise.  Physical activity- walking as tolerated.   Diet:  Regular Water: fair intake  Other Providers Patient Care Team: Jodelle Green, FNP as PCP - General (Family Medicine) Sable Feil, MD as Attending Physician (Gastroenterology) Shanon Ace, MD as Referring Physician (Anesthesiology) Jari Pigg, MD as Attending Physician (Dermatology) Mahalia Longest (Internal Medicine) Norma Fredrickson, MD as Consulting Physician (Psychiatry)  Exercise Activities and Dietary recommendations Current Exercise Habits: Home exercise routine, Type of exercise: walking, Intensity: Mild  Goals      Patient Stated   . Follow up with Primary Care Provider (pt-stated)       Fall Risk Fall Risk  08/04/2019 06/15/2019  Falls in the past year? 0 0  Comment none since last reported -  Number falls in past yr: - 0  Risk for fall due to : Impaired balance/gait -  Follow up Falls prevention discussed Falls evaluation completed   Timed Get Up and Go Performed: no, virtual visit  Depression Screen PHQ 2/9 Scores 08/04/2019 06/15/2019  PHQ - 2 Score 0 0    Cognitive Function     6CIT Screen 08/04/2019  What Year? 0 points  What month? 0 points  What time? 0 points  Count back from 20 0 points  Months in reverse 0 points  Repeat phrase 0 points  Total Score 0    Immunization History  Administered Date(s) Administered  . Influenza Split 05/21/2012  . Influenza Whole 09/12/2009  . Influenza, High Dose Seasonal PF 05/22/2016, 10/03/2018  . Influenza, Seasonal, Injecte, Preservative Fre 05/06/2013  . Influenza-Unspecified 05/17/2011, 04/29/2013  . Pneumococcal Conjugate-13 01/06/2015  . Pneumococcal Polysaccharide-23 08/18/2014  . Pneumococcal-Unspecified 10/20/2009   Screening Tests Health Maintenance  Topic Date Due  . INFLUENZA VACCINE  10/28/2019 (Originally 02/28/2019)  . COLONOSCOPY  08/03/2020 (Originally 05/12/2005)  .  TETANUS/TDAP  08/03/2020 (Originally 06/29/1968)  . PNA vac Low Risk Adult  Completed  . Hepatitis C Screening  Discontinued       Plan:   Keep all routine maintenance appointments.   Follow up 2/15/21and establish with Mable Paris.   Medicare Attestation I have personally reviewed: The patient's medical and social history Their use of alcohol, tobacco or illicit drugs Their current medications and supplements The patient's functional ability including ADLs,fall risks, home safety risks, cognitive,  and hearing and visual impairment Diet and physical activities Evidence for depression   I have reviewed and discussed with patient certain preventive protocols, quality metrics, and best practice recommendations.     Varney Biles, LPN  01/04/6719  Agree with plan. Mable Paris, NP

## 2019-08-04 NOTE — Patient Instructions (Addendum)
  Mr. [name] , Thank you for taking time to come for your Medicare Wellness Visit. I appreciate your ongoing commitment to your health goals. Please review the following plan we discussed and let me know if I can assist you in the future.   These are the goals we discussed: Goals      Patient Stated   . Follow up with Primary Care Provider (pt-stated)       This is a list of the screening recommended for you and due dates:  Health Maintenance  Topic Date Due  . Flu Shot  10/28/2019*  . Colon Cancer Screening  08/03/2020*  . Tetanus Vaccine  08/03/2020*  . Pneumonia vaccines  Completed  .  Hepatitis C: One time screening is recommended by Center for Disease Control  (CDC) for  adults born from [birth year] through 1967.   Discontinued  *Topic was postponed. The date shown is not the original due date.

## 2019-08-06 NOTE — Telephone Encounter (Signed)
Patient scheduled for labs on 08/06/2019

## 2019-08-10 ENCOUNTER — Other Ambulatory Visit: Payer: Medicare Other

## 2019-09-02 ENCOUNTER — Other Ambulatory Visit (INDEPENDENT_AMBULATORY_CARE_PROVIDER_SITE_OTHER): Payer: Medicare Other

## 2019-09-02 DIAGNOSIS — E039 Hypothyroidism, unspecified: Secondary | ICD-10-CM

## 2019-09-02 LAB — TSH: TSH: 8.46 u[IU]/mL — ABNORMAL HIGH (ref 0.35–4.50)

## 2019-09-02 LAB — T4, FREE: Free T4: 0.82 ng/dL (ref 0.60–1.60)

## 2019-09-03 ENCOUNTER — Telehealth: Payer: Self-pay

## 2019-09-03 NOTE — Telephone Encounter (Signed)
Routing this message to the front desk for scheduling purposes. 

## 2019-09-03 NOTE — Telephone Encounter (Signed)
Called patient to schedule appointment. Patient states he was just seen in November. Patient was seen on 06/08/19. Patient states he come is once per year. Per AVS dated 06/08/19 states: Please come back for follow up in 1 year. Patient states he had his labs done at [facility] on [address] yesterday (09/02/19). Patient was scheduled for 1 year follow up on 06/07/20. Patient apologizes for delaying his blood work. If patient needs to come in sooner please advise.  Patient requests the following RX with refills be sent asap:  MEDICATION: levothyroxine (SYNTHROID) 50 MCG tablet  PHARMACY:   [facility] [id number], [place] AT [address] Phone:  [phone]  Fax:  [phone]      IS THIS A 90 DAY SUPPLY : Yes   IS PATIENT OUT OF MEDICATION: Yes-for about 3 weeks  IF NOT; HOW MUCH IS LEFT: 0  LAST APPOINTMENT DATE: @11 /03/2019  NEXT APPOINTMENT DATE:@11 /03/2020  DO WE HAVE YOUR PERMISSION TO LEAVE A DETAILED MESSAGE: Yes  OTHER COMMENTS: Patient states he has been out of the above medication for over 3 weeks and is feeling the effects-sluggish, tired..   **Let patient know to contact pharmacy at the end of the day to make sure medication is ready. **  ** Please notify patient to allow 48-72 hours to process**  **Encourage patient to contact the pharmacy for refills or they can request refills through [facility]**

## 2019-09-03 NOTE — Telephone Encounter (Signed)
-----   Message from Renato Shin, MD sent at 09/02/2019  5:52 PM EST ----- please contact patient: F/u ov is due

## 2019-09-04 ENCOUNTER — Other Ambulatory Visit: Payer: Self-pay

## 2019-09-04 DIAGNOSIS — E039 Hypothyroidism, unspecified: Secondary | ICD-10-CM

## 2019-09-04 MED ORDER — LEVOTHYROXINE SODIUM 50 MCG PO TABS: 50.0000 ug | ORAL_TABLET | Freq: Every day | ORAL | 0 refills | Status: DC

## 2019-09-04 NOTE — Telephone Encounter (Signed)
Based on recent blood work, does pt need to change dose?

## 2019-09-04 NOTE — Telephone Encounter (Signed)
Based on previous entries and given pt statements, is Levothyroxine dosage appropriate to refill BEFORE scheduling an appt. OR do wish for pt to come in FIRST before refilling?

## 2019-09-04 NOTE — Telephone Encounter (Signed)
Patient called today extremely upset- he came in for a visit on 06/08/19 and was told he did not have to come in again for a year and this is on his AVS-patient was told he would have his medication filled for the year and than he would have to follow up-patient is stating his Levothyroxine is not being filled as promised and he has been out for over three weeks-patient reported he was told to have lab work done and he did that on 09/02/19 but his medication was still not sent in-patient stating he should not have to come in just to get a refill on his medicine-he reports coming in is a hardship for him due to being disabled and now he has a gap in therapy since his medication has not been refilled-please advise-patient would like a call back today

## 2019-09-04 NOTE — Telephone Encounter (Signed)
Pt was called and advised of the message below that indicates a message was sent to Dr. Loanne Drilling regarding reviewing pt's blood work to determine if dose needs to be changed. I cannot find a note indicating that pt still requires an office visit.

## 2019-09-04 NOTE — Telephone Encounter (Signed)
-----   Message from Renato Shin, MD sent at 09/02/2019  5:52 PM EST ----- please contact patient: F/u ov is due

## 2019-09-04 NOTE — Telephone Encounter (Signed)
1.  Please schedule f/u appt 2.  Then please refill x 1, pending that appt.  

## 2019-09-04 NOTE — Telephone Encounter (Signed)
Pt schedule 09/07/19. 30 day Rx sent per Dr. Cordelia Pen orders. Please refer to medications re: date/time/location sent.

## 2019-09-07 ENCOUNTER — Ambulatory Visit: Payer: Medicare Other | Admitting: Endocrinology

## 2019-09-14 ENCOUNTER — Encounter: Payer: Self-pay | Admitting: Family

## 2019-09-14 ENCOUNTER — Other Ambulatory Visit: Payer: Self-pay

## 2019-09-14 ENCOUNTER — Ambulatory Visit (INDEPENDENT_AMBULATORY_CARE_PROVIDER_SITE_OTHER): Payer: Medicare Other | Admitting: Family

## 2019-09-14 DIAGNOSIS — F419 Anxiety disorder, unspecified: Secondary | ICD-10-CM

## 2019-09-14 DIAGNOSIS — E039 Hypothyroidism, unspecified: Secondary | ICD-10-CM | POA: Diagnosis not present

## 2019-09-14 DIAGNOSIS — K7460 Unspecified cirrhosis of liver: Secondary | ICD-10-CM | POA: Diagnosis not present

## 2019-09-14 DIAGNOSIS — G894 Chronic pain syndrome: Secondary | ICD-10-CM

## 2019-09-14 DIAGNOSIS — I10 Essential (primary) hypertension: Secondary | ICD-10-CM | POA: Insufficient documentation

## 2019-09-14 MED ORDER — LOSARTAN POTASSIUM 100 MG PO TABS: ORAL_TABLET | ORAL | 1 refills | Status: DC

## 2019-09-14 MED ORDER — HYDROCHLOROTHIAZIDE 12.5 MG PO TABS: 12.5000 mg | ORAL_TABLET | Freq: Every day | ORAL | 3 refills | Status: DC

## 2019-09-14 NOTE — Assessment & Plan Note (Signed)
BP Readings from Last 3 Encounters:  06/28/19 (!) 135/56  06/08/19 (!) 172/70  03/21/19 (!) 152/62   Improved. Asked patient to follow up in office and bring BP cuff so we can ensure accurate blood pressure.

## 2019-09-14 NOTE — Progress Notes (Signed)
Verbal consent for services obtained from patient prior to services given to TELEPHONE visit:   Location of call:  provider at work patient at home  Names of all persons present for services: Mable Paris, NP  Chief complaint:  Establish care, transfer care  HTN-started on HCTZ 4 months ago, Bp at home 140/70, 131/68, 135/74. No CP.   Hypothyroidism-TSH elevated 08/2019. Follows with Dr. Loanne Drilling  Hepatic cirrhosis ( dx 2008), portal hypertension, pancytopenia with prominent thrombocytopenia-follows with Dr. Verl Blalock, prescribes on amitiza, Treated with interferon in 2008. He ensures that he has bowel movement every day.    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 Dr Kennis Carina whom prescribes narcan, roxicodone.   GAD- at stable. Follows with Dr Nehemiah Massed for xanax.   Declines colonoscopy  A/P/next steps: Problem List Items Addressed This Visit      Cardiovascular and Mediastinum   HTN (hypertension)    BP Readings from Last 3 Encounters:  06/28/19 (!) 135/56  06/08/19 (!) 172/70  03/21/19 (!) 152/62   Improved. Asked patient to follow up in office and bring BP cuff so we can ensure accurate blood pressure.       Relevant Medications   hydrochlorothiazide (HYDRODIURIL) 12.5 MG tablet   losartan (COZAAR) 100 MG tablet     Digestive   Hepatic cirrhosis (HCC)    Continues to follow with [facility]; will follow        Endocrine   Hypothyroidism    Patient is not euthyroid. Advised that he makes a follow up with Dr Loanne Drilling; patient aware of this.         Other   Anxiety    Stable. Will continue to follow      Chronic pain syndrome    Chronic, at baseline. Will follow.          I spent 25 min  discussing plan of care over the phone.

## 2019-09-14 NOTE — Assessment & Plan Note (Signed)
Patient is not euthyroid. Advised that he makes a follow up with Dr Loanne Drilling; patient aware of this.

## 2019-09-14 NOTE — Assessment & Plan Note (Signed)
Chronic, at baseline. Will follow.

## 2019-09-14 NOTE — Assessment & Plan Note (Signed)
Stable.  Will continue to follow

## 2019-09-14 NOTE — Assessment & Plan Note (Signed)
Continues to follow with [facility]; will follow

## 2019-09-30 ENCOUNTER — Encounter: Payer: Medicare Other | Admitting: Endocrinology

## 2019-09-30 ENCOUNTER — Other Ambulatory Visit: Payer: Self-pay

## 2019-09-30 NOTE — Progress Notes (Signed)
   Subjective:    Patient ID: [name]., male    DOB: Dec 14, 1950, 69 y.o.   MRN: [id number]  HPI    Review of Systems     Objective:   Physical Exam        Assessment & Plan:

## 2019-10-05 ENCOUNTER — Other Ambulatory Visit: Payer: Self-pay | Admitting: Endocrinology

## 2019-10-05 DIAGNOSIS — E039 Hypothyroidism, unspecified: Secondary | ICD-10-CM

## 2019-11-02 ENCOUNTER — Other Ambulatory Visit: Payer: Self-pay | Admitting: Endocrinology

## 2019-11-02 DIAGNOSIS — E039 Hypothyroidism, unspecified: Secondary | ICD-10-CM

## 2019-11-13 ENCOUNTER — Other Ambulatory Visit: Payer: Self-pay

## 2019-11-13 ENCOUNTER — Other Ambulatory Visit: Payer: Self-pay | Admitting: Family

## 2019-11-13 ENCOUNTER — Telehealth: Payer: Self-pay

## 2019-11-13 ENCOUNTER — Telehealth: Payer: Self-pay | Admitting: Family

## 2019-11-13 ENCOUNTER — Encounter: Payer: Self-pay | Admitting: Family

## 2019-11-13 ENCOUNTER — Ambulatory Visit (INDEPENDENT_AMBULATORY_CARE_PROVIDER_SITE_OTHER): Payer: Medicare Other | Admitting: Family

## 2019-11-13 ENCOUNTER — Other Ambulatory Visit (INDEPENDENT_AMBULATORY_CARE_PROVIDER_SITE_OTHER): Payer: Medicare Other

## 2019-11-13 VITALS — BP 148/64 | HR 84 | Temp 96.3°F | Ht 71.0 in | Wt 210.2 lb

## 2019-11-13 DIAGNOSIS — K7460 Unspecified cirrhosis of liver: Secondary | ICD-10-CM

## 2019-11-13 DIAGNOSIS — R7301 Impaired fasting glucose: Secondary | ICD-10-CM

## 2019-11-13 DIAGNOSIS — E785 Hyperlipidemia, unspecified: Secondary | ICD-10-CM | POA: Diagnosis not present

## 2019-11-13 DIAGNOSIS — G894 Chronic pain syndrome: Secondary | ICD-10-CM | POA: Diagnosis not present

## 2019-11-13 DIAGNOSIS — I10 Essential (primary) hypertension: Secondary | ICD-10-CM | POA: Diagnosis not present

## 2019-11-13 DIAGNOSIS — Z125 Encounter for screening for malignant neoplasm of prostate: Secondary | ICD-10-CM

## 2019-11-13 DIAGNOSIS — R39198 Other difficulties with micturition: Secondary | ICD-10-CM

## 2019-11-13 LAB — CBC WITH DIFFERENTIAL/PLATELET
Basophils Absolute: 0 10*3/uL (ref 0.0–0.1)
Basophils Relative: 0.9 % (ref 0.0–3.0)
Eosinophils Absolute: 0.1 10*3/uL (ref 0.0–0.7)
Eosinophils Relative: 4.1 % (ref 0.0–5.0)
HCT: 29 % — ABNORMAL LOW (ref 39.0–52.0)
Hemoglobin: 9.8 g/dL — ABNORMAL LOW (ref 13.0–17.0)
Lymphocytes Relative: 26.4 % (ref 12.0–46.0)
Lymphs Abs: 0.5 10*3/uL — ABNORMAL LOW (ref 0.7–4.0)
MCHC: 33.7 g/dL (ref 30.0–36.0)
MCV: 97.4 fl (ref 78.0–100.0)
Monocytes Absolute: 0.1 10*3/uL (ref 0.1–1.0)
Monocytes Relative: 7.5 % (ref 3.0–12.0)
Neutro Abs: 1.2 10*3/uL — ABNORMAL LOW (ref 1.4–7.7)
Neutrophils Relative %: 61.1 % (ref 43.0–77.0)
Platelets: 48 10*3/uL — CL (ref 150.0–400.0)
RBC: 2.98 Mil/uL — ABNORMAL LOW (ref 4.22–5.81)
RDW: 22.2 % — ABNORMAL HIGH (ref 11.5–15.5)
WBC: 2 10*3/uL — ABNORMAL LOW (ref 4.0–10.5)

## 2019-11-13 LAB — LIPID PANEL
Cholesterol: 121 mg/dL (ref 0–200)
HDL: 41.3 mg/dL (ref >39.00)
LDL Cholesterol: 54 mg/dL (ref 0–99)
NonHDL: 79.51
Total CHOL/HDL Ratio: 3
Triglycerides: 127 mg/dL (ref 0.0–149.0)
VLDL: 25.4 mg/dL (ref 0.0–40.0)

## 2019-11-13 LAB — COMPREHENSIVE METABOLIC PANEL
ALT: 17 U/L (ref 0–53)
AST: 20 U/L (ref 0–37)
Albumin: 4.6 g/dL (ref 3.5–5.2)
Alkaline Phosphatase: 63 U/L (ref 39–117)
BUN: 11 mg/dL (ref 6–23)
CO2: 26 mEq/L (ref 19–32)
Calcium: 9.5 mg/dL (ref 8.4–10.5)
Chloride: 99 mEq/L (ref 96–112)
Creatinine, Ser: 0.67 mg/dL (ref 0.40–1.50)
GFR: 117.15 mL/min (ref >60.00)
Glucose, Bld: 107 mg/dL — ABNORMAL HIGH (ref 70–99)
Potassium: 4.1 mEq/L (ref 3.5–5.1)
Sodium: 136 mEq/L (ref 135–145)
Total Bilirubin: 2 mg/dL — ABNORMAL HIGH (ref 0.2–1.2)
Total Protein: 7.3 g/dL (ref 6.0–8.3)

## 2019-11-13 LAB — HEMOGLOBIN A1C: Hgb A1c MFr Bld: 3.4 % — ABNORMAL LOW (ref 4.6–6.5)

## 2019-11-13 LAB — PSA, MEDICARE: PSA: 1.29 ng/ml (ref 0.10–4.00)

## 2019-11-13 MED ORDER — PROPRANOLOL HCL 40 MG PO TABS: 40.0000 mg | ORAL_TABLET | Freq: Two times a day (BID) | ORAL | 1 refills | Status: DC

## 2019-11-13 NOTE — Patient Instructions (Addendum)
Please call [facility] Dr Tawanna Solo and very important to  Have follow up for liver disease.  Please also ask when your colonoscopy is due.   [name], [place], [place] [address]  [id number]  Start Propranolol twice daily.  This medication will lower your blood pressure as well as your heart rate.  It I s imperative that you are careful with changing positions.  Please monitor blood pressure at home over the next week we should start seeing it coming down closer to less than 130/80, closer to 120/80. Please call with any questions.

## 2019-11-13 NOTE — Telephone Encounter (Signed)
Critical  Platelet of 48,000 [name] from [facility] called.

## 2019-11-13 NOTE — Assessment & Plan Note (Signed)
Uncontrolled. Reviewed last note with [facility] , in setting portal hypertension, I have opted to use propranolol in adjunct with losartan, hctz. Close follow up

## 2019-11-13 NOTE — Assessment & Plan Note (Signed)
Per patient, pain management office is going out of business.  I given referral to establish.  I explained today that I would not take over his pain management medications

## 2019-11-13 NOTE — Assessment & Plan Note (Signed)
Pending urology consult, psa

## 2019-11-13 NOTE — Assessment & Plan Note (Signed)
Lost to follow-up.  Overdue for surveillance. discussed importance of this with patient today.  Our office will call to inform [facility] of this regard and also given patient phone number to you make an appointment for himself as well. Will follow

## 2019-11-13 NOTE — Progress Notes (Signed)
Subjective:    Patient ID: Derek Rough., male    DOB: 01/15/1951, 69 y.o.   MRN: [id number]  CC: Derek Sudduth Mabee [name]. is a 69 y.o. male who presents today for follow up.   HPI: HTN- compliant with hctz, losartan. At home around 148/64. No NSAID use.   Denies exertional chest pain or pressure, numbness or tingling radiating to left arm or jaw, palpitations, dizziness, frequent headaches, changes in vision, or shortness of breath.   Checked for sleep apnea, and negative per patient.   Needs referral for pain management as [facility] is going out of business.   H/o enlarged prostate. Doesn't feel that empties bladder. No hematuria. No h/o prostate cancer.       CT Angio abdomen 03/2018-mildly nodular liver contour without focal lesion. 9/2019Tawanna Solo PA .portal hypertension with splenomegaly   Patient follows with psychiatry, pain management, endocrine Last colonoscopy 2017 Had followed with  Franco Collet PA at [facility] for cirrhosis  HISTORY:  Past Medical History:  Diagnosis Date  . 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 ANXIETY 03/24/2007  . Chronic pain   . CIRRHOSIS 03/24/2007  . 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 FRACTURE, WRIST, LEFT 10/12/2009   Qualifier: Diagnosis of  By: Loanne Drilling MD, Jacelyn Pi   . HEPATITIS C 03/24/2007  . HYPERLIPIDEMIA 09/12/2009  . Hypocalcemia 12/23/2007   Qualifier: Diagnosis of  By: Loanne Drilling MD, Jacelyn Pi   . Pancytopenia 12/23/2007  . 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 UROLITHIASIS, HX OF 12/23/2007   Past Surgical History:  Procedure Laterality Date  . ABOVE KNEE LEG AMPUTATION Right    Right femur fracture, 11/1994, resulted in right AKA 2006 after staph infection   Family History  Problem Relation Age of Onset  . Heart disease Mother   . Lung cancer Mother 13  . Heart attack Paternal Grandmother   . Heart attack Paternal Grandfather   . Liver cancer Neg Hx     Allergies: Clindamycin Current  Outpatient Medications on File Prior to Visit  Medication Sig Dispense Refill  . ALPRAZolam (XANAX) 1 MG tablet Take 1 mg by mouth 3 (three) times daily.      . Cholecalciferol (VITAMIN D3) 10 MCG (400 UNIT) CAPS Take 4 capsules by mouth daily.    . hydrochlorothiazide (HYDRODIURIL) 12.5 MG tablet Take 1 tablet (12.5 mg total) by mouth daily. 90 tablet 3  . levothyroxine (SYNTHROID) 50 MCG tablet TAKE 1 TABLET(50 MCG) BY MOUTH DAILY BEFORE BREAKFAST 30 tablet 0  . losartan (COZAAR) 100 MG tablet TAKE 1 TABLET(100 MG) BY MOUTH DAILY 90 tablet 1  . lubiprostone (AMITIZA) 24 MCG capsule Take 24 mcg by mouth daily as needed.     Marland Kitchen NARCAN 4 MG/0.1ML LIQD nasal spray kit SPRAY 0.1 ML (4 MG) IN 1 NOSTRIL/ MAY REPEAT DOSE EVERY 2 3 MINUTES AS NEEDED ALTERNATING NOSTRILS    . oxycodone (ROXICODONE) 30 MG immediate release tablet Take 1 tablet by mouth 5 (five) times daily as needed.    . vitamin B-12 (CYANOCOBALAMIN) 1000 MCG tablet Take 1 tablet (1,000 mcg total) by mouth daily.     No current facility-administered medications on file prior to visit.    Social History   Tobacco Use  . Smoking status: Never Smoker  . Smokeless tobacco: Never Used  Substance Use Topics  . Alcohol use: Not Currently    Alcohol/week: 0.0 standard drinks  .  Drug use: No    Review of Systems  Constitutional: Negative for chills and fever.  HENT: Negative for congestion, ear pain, rhinorrhea, sinus pressure and sore throat.   Respiratory: Negative for cough, shortness of breath and wheezing.   Cardiovascular: Negative for chest pain and palpitations.  Gastrointestinal: Negative for diarrhea, nausea and vomiting.  Genitourinary: Positive for difficulty urinating. Negative for dysuria and hematuria.  Musculoskeletal: Negative for myalgias.  Skin: Negative for rash.  Neurological: Negative for headaches.  Hematological: Negative for adenopathy.      Objective:    BP (!) 148/64   Pulse 84   Temp (!) 96.3 F  (35.7 C) (Temporal)   Ht 5' 11"  (1.803 m)   Wt 210 lb 3.2 oz (95.3 kg)   SpO2 98%   BMI 29.32 kg/m  BP Readings from Last 3 Encounters:  11/13/19 (!) 148/64  06/28/19 (!) 135/56  06/08/19 (!) 172/70   Wt Readings from Last 3 Encounters:  11/13/19 210 lb 3.2 oz (95.3 kg)  09/14/19 210 lb (95.3 kg)  08/04/19 200 lb (90.7 kg)    Physical Exam Vitals reviewed.  Constitutional:      Appearance: He is well-developed.  Cardiovascular:     Rate and Rhythm: Regular rhythm.     Heart sounds: Normal heart sounds.  Pulmonary:     Effort: Pulmonary effort is normal. No respiratory distress.     Breath sounds: Normal breath sounds. No wheezing, rhonchi or rales.  Skin:    General: Skin is warm and dry.  Neurological:     Mental Status: He is alert.  Psychiatric:        Speech: Speech normal.        Behavior: Behavior normal.        Assessment & Plan:   Problem List Items Addressed This Visit      Cardiovascular and Mediastinum   HTN (hypertension)    Uncontrolled. Reviewed last note with [facility] , in setting portal hypertension, I have opted to use propranolol in adjunct with losartan, hctz. Close follow up      Relevant Medications   propranolol (INDERAL) 40 MG tablet   Other Relevant Orders   CBC with Differential/Platelet   Comprehensive metabolic panel   Hemoglobin A1c     Digestive   Hepatic cirrhosis ([place])    Lost to follow-up.  Overdue for surveillance. discussed importance of this with patient today.  Our office will call to inform PA [facility] of this regard and also given patient phone number to you make an appointment for himself as well. Will follow      Relevant Orders   CBC with Differential/Platelet   Comprehensive metabolic panel   Lipid panel   Hemoglobin A1c     Other   Chronic pain syndrome - Primary    Per patient, pain management office is going out of business.  I given referral to establish.  I explained today that I would not take over his  pain management medications      Relevant Orders   Ambulatory referral to Pain Clinic   Dyslipidemia   Relevant Orders   Lipid panel   Hemoglobin A1c   Screening for prostate cancer    Pending urology consult, psa      Relevant Orders   Ambulatory referral to Urology   PSA, Medicare ( [place] only)    Other Visit Diagnoses    Difficulty urinating       Impaired fasting glucose  Relevant Orders   Hemoglobin A1c       I am having Derek C. Etcheverry Jr. start on propranolol. I am also having him maintain his ALPRAZolam, oxycodone, vitamin B-12, lubiprostone, Narcan, Vitamin D3, hydrochlorothiazide, losartan, and levothyroxine.   Meds ordered this encounter  Medications  . propranolol (INDERAL) 40 MG tablet    Sig: Take 1 tablet (40 mg total) by mouth 2 (two) times daily.    Dispense:  120 tablet    Refill:  1    Order Specific Question:   Supervising Provider    Answer:   Crecencio Mc [[id number]]    Return precautions given.   Risks, benefits, and alternatives of the medications and treatment plan prescribed today were discussed, and patient expressed understanding.   Education regarding symptom management and diagnosis given to patient on AVS.  Continue to follow with [name], FNP for routine health maintenance.   Derek Anon Ricci Jr. and I agreed with plan.   Mable Paris, FNP

## 2019-11-13 NOTE — Telephone Encounter (Signed)
Close  

## 2019-11-16 ENCOUNTER — Ambulatory Visit (INDEPENDENT_AMBULATORY_CARE_PROVIDER_SITE_OTHER): Payer: Medicare Other | Admitting: Endocrinology

## 2019-11-16 ENCOUNTER — Other Ambulatory Visit: Payer: Self-pay

## 2019-11-16 ENCOUNTER — Encounter: Payer: Self-pay | Admitting: Endocrinology

## 2019-11-16 ENCOUNTER — Telehealth: Payer: Self-pay

## 2019-11-16 VITALS — BP 130/70 | HR 85 | Ht 71.0 in | Wt 205.0 lb

## 2019-11-16 DIAGNOSIS — E039 Hypothyroidism, unspecified: Secondary | ICD-10-CM

## 2019-11-16 LAB — BILIRUBIN, DIRECT: Bilirubin, Direct: 0.4 mg/dL — ABNORMAL HIGH (ref 0.0–0.3)

## 2019-11-16 NOTE — Patient Instructions (Addendum)
Blood tests are requested for you today.  We'll let you know about the results.   ?It is best to never miss the levothyroxine.  However, if you do miss it, next best is to double up the next time.   ?Please come back for a follow-up appointment in 6 months.   ? ?

## 2019-11-16 NOTE — Telephone Encounter (Signed)
Lab at [facility] called and stated they added a Direct Bilirubin instead of the hepatic since a lot of the test where duplicate.

## 2019-11-16 NOTE — Progress Notes (Signed)
Subjective:    Patient ID: Derek Rough., male    DOB: 14-Apr-1951, 69 y.o.   MRN: [id number]  HPI Pt returns for f/u of chronic primary hypothyroidism (dx'ed 2015; he has been on synthroid since then; he has never had thyroid imaging).   pt states he feels well in general.  He takes synthroid as rx'ed.   Past Medical History:  Diagnosis Date  . 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 ANXIETY 03/24/2007  . Chronic pain   . CIRRHOSIS 03/24/2007  . 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 FRACTURE, WRIST, LEFT 10/12/2009   Qualifier: Diagnosis of  By: Loanne Drilling MD, Jacelyn Pi   . HEPATITIS C 03/24/2007  . HYPERLIPIDEMIA 09/12/2009  . Hypocalcemia 12/23/2007   Qualifier: Diagnosis of  By: Loanne Drilling MD, Jacelyn Pi   . Pancytopenia 12/23/2007  . 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 UROLITHIASIS, HX OF 12/23/2007    Past Surgical History:  Procedure Laterality Date  . ABOVE KNEE LEG AMPUTATION Right    Right femur fracture, 11/1994, resulted in right AKA 2006 after staph infection    Social History   Socioeconomic History  . Marital status: Widowed    Spouse name: Not on file  . Number of children: Not on file  . Years of education: Not on file  . Highest education level: Not on file  Occupational History  . Occupation: Disabled  Tobacco Use  . Smoking status: Never Smoker  . Smokeless tobacco: Never Used  Substance and Sexual Activity  . Alcohol use: Not Currently    Alcohol/week: 0.0 standard drinks  . Drug use: No  . Sexual activity: Not on file  Other Topics Concern  . Not on file  Social History Narrative   Widowed 1997   Lives alone   Lost a son in his arms in 13.    3 sons   Social Determinants of Health   Financial Resource Strain:   . Difficulty of Paying Living Expenses:   Food Insecurity:   . Worried About Charity fundraiser in the Last Year:   . Arboriculturist in the Last Year:   Transportation Needs:   . Lexicographer (Medical):   Marland Kitchen Lack of Transportation (Non-Medical):   Physical Activity:   . Days of Exercise per Week:   . Minutes of Exercise per Session:   Stress:   . Feeling of Stress :   Social Connections:   . Frequency of Communication with Friends and Family:   . Frequency of Social Gatherings with Friends and Family:   . Attends Religious Services:   . Active Member of Clubs or Organizations:   . Attends Archivist Meetings:   Marland Kitchen Marital Status:   Intimate Partner Violence:   . Fear of Current or Ex-Partner:   . Emotionally Abused:   Marland Kitchen Physically Abused:   . Sexually Abused:     Current Outpatient Medications on File Prior to Visit  Medication Sig Dispense Refill  . ALPRAZolam (XANAX) 1 MG tablet Take 1 mg by mouth 3 (three) times daily.      . Cholecalciferol (VITAMIN D3) 10 MCG (400 UNIT) CAPS Take 4 capsules by mouth daily.    . hydrochlorothiazide (HYDRODIURIL) 12.5 MG tablet Take 1 tablet (12.5 mg total) by mouth daily. 90 tablet 3  . levothyroxine (SYNTHROID) 50 MCG tablet TAKE 1 TABLET(50 MCG) BY MOUTH DAILY BEFORE  BREAKFAST 30 tablet 0  . losartan (COZAAR) 100 MG tablet TAKE 1 TABLET(100 MG) BY MOUTH DAILY 90 tablet 1  . lubiprostone (AMITIZA) 24 MCG capsule Take 24 mcg by mouth daily as needed.     Marland Kitchen NARCAN 4 MG/0.1ML LIQD nasal spray kit SPRAY 0.1 ML (4 MG) IN 1 NOSTRIL/ MAY REPEAT DOSE EVERY 2 3 MINUTES AS NEEDED ALTERNATING NOSTRILS    . oxycodone (ROXICODONE) 30 MG immediate release tablet Take 1 tablet by mouth 5 (five) times daily as needed.    . propranolol (INDERAL) 40 MG tablet Take 1 tablet (40 mg total) by mouth 2 (two) times daily. 120 tablet 1  . vitamin B-12 (CYANOCOBALAMIN) 1000 MCG tablet Take 1 tablet (1,000 mcg total) by mouth daily.     No current facility-administered medications on file prior to visit.    Allergies  Allergen Reactions  . Clindamycin Dermatitis    Family History  Problem Relation Age of Onset  . Heart disease  Mother   . Lung cancer Mother 76  . Heart attack Paternal Grandmother   . Heart attack Paternal Grandfather   . Liver cancer Neg Hx     BP 130/70   Pulse 85   Ht 5' 11"  (1.803 m)   Wt 205 lb (93 kg)   SpO2 99%   BMI 28.59 kg/m    Review of Systems Denies weight change.      Objective:   Physical Exam VITAL SIGNS:  See vs page GENERAL: no distress NECK: There is no palpable thyroid enlargement.  No thyroid nodule is palpable.  No palpable lymphadenopathy at the anterior neck.     Lab Results  Component Value Date   TSH 4.47 11/16/2019      Assessment & Plan:  Hypothyroidism: well-replaced.  Please continue the same medication

## 2019-11-17 ENCOUNTER — Telehealth: Payer: Self-pay | Admitting: Family

## 2019-11-17 ENCOUNTER — Telehealth: Payer: Self-pay

## 2019-11-17 DIAGNOSIS — K7460 Unspecified cirrhosis of liver: Secondary | ICD-10-CM

## 2019-11-17 LAB — TSH: TSH: 4.47 u[IU]/mL (ref 0.35–4.50)

## 2019-11-17 LAB — T4, FREE: Free T4: 0.88 ng/dL (ref 0.60–1.60)

## 2019-11-17 NOTE — Telephone Encounter (Signed)
I called patient to advise on below. I let him know he should get a call within the day to get set up with an appointment at [facility]. He will come by to pick up stool which I am leaving at front desk.

## 2019-11-17 NOTE — Telephone Encounter (Signed)
[name], I placed urgent referral for pt to get re-established with due to liver disease. Let me know if you need anything from my end.    Sarah, Call patient and let him know that I have submitted referral to our referral coordinator in the office.  Since he last saw Ileana Ladd PA in 2018, advise him to call below to make an appt as soon as possible. Ask him to let us know if any problems.   Please also remind  Him to pick up stool cards here. Please ensure they are out front for patient.  [facility]:  [address], [address]  [place], [place] [address]  [phone]  [phone] (Fax)

## 2019-11-17 NOTE — Telephone Encounter (Signed)
-----   Message from Renato Shin, MD sent at 11/17/2019  2:34 PM EDT ----- please contact patient: Normal.  Please continue the same medication.  I'll see you next time.

## 2019-11-17 NOTE — Telephone Encounter (Signed)
LAB RESULTS  Lab results were reviewed by Dr. Ellison. A letter has been mailed to pt home address. For future reference, letter can be found in Epic. 

## 2019-11-17 NOTE — Telephone Encounter (Signed)
Referral telephone note and ofc notes from last visit ins information was faxed. Thank you for the information.

## 2019-11-17 NOTE — Telephone Encounter (Signed)
Pt states that he needs a referral sent to GI due to recent low platelets. He states that this needs to be done ASAP. [facility] fax # [phone]

## 2019-11-19 ENCOUNTER — Other Ambulatory Visit: Payer: Medicare Other

## 2019-11-19 ENCOUNTER — Other Ambulatory Visit (INDEPENDENT_AMBULATORY_CARE_PROVIDER_SITE_OTHER): Payer: Medicare Other

## 2019-11-19 ENCOUNTER — Other Ambulatory Visit: Payer: Self-pay

## 2019-11-19 DIAGNOSIS — K7460 Unspecified cirrhosis of liver: Secondary | ICD-10-CM

## 2019-11-19 LAB — FECAL OCCULT BLOOD, IMMUNOCHEMICAL: Fecal Occult Bld: NEGATIVE

## 2019-11-20 NOTE — Telephone Encounter (Signed)
FYI I called patient & he does have an appointment Monday with Cardiology at [facility]. He asked if I would fax labs to their office & I will do so.

## 2019-11-20 NOTE — Telephone Encounter (Signed)
Pt returned your call.  

## 2019-11-23 NOTE — Telephone Encounter (Signed)
Call pt ( there is also a result note out there for patient) Cardiology?  Does he mean pain management?

## 2019-11-23 NOTE — Telephone Encounter (Signed)
So sorry I typed Cardiology but it was Gertie Fey that he was going to this morning.

## 2019-11-23 NOTE — Telephone Encounter (Signed)
noted 

## 2019-11-24 ENCOUNTER — Other Ambulatory Visit: Payer: Self-pay | Admitting: Family

## 2019-11-25 ENCOUNTER — Telehealth: Payer: Self-pay | Admitting: Family

## 2019-11-25 ENCOUNTER — Other Ambulatory Visit: Payer: Self-pay | Admitting: Family

## 2019-11-25 DIAGNOSIS — K7460 Unspecified cirrhosis of liver: Secondary | ICD-10-CM

## 2019-11-25 NOTE — Telephone Encounter (Signed)
Angela Nevin from [facility], [phone] is returning your phone call.

## 2019-11-25 NOTE — Telephone Encounter (Signed)
I called patient & let him know that referral was placed. Patient stated to me that he had a lot going through his mind. He wanted to make sure that he wasn't going to die. I told him I would ask you on some guidance with this? I told him that he was going to GI for surveillance & monitoring to help prolong his life. We wanted to keep him as healthy as possible & quality as best as possible. I told him there was no mention of anything to me terminal & that this would have been dicussed with him. I just did not want patient to worry.  Patient also wanted to to thank you so much for your thoroughness & care. He was nervous about having a male physician, but patient stated that he was so happy you were now his PCP.   Elvina Mattes is working on referral now to [facility].

## 2019-11-25 NOTE — Telephone Encounter (Signed)
R,  Urgent referral to local GI due to cirrhorosis How soon can he been seen?  Sarah,  Call pt Let pt know ref is in Let us know if you dont hear back within a week in regards to an appointment being scheduled.

## 2019-11-25 NOTE — Telephone Encounter (Signed)
I called [facility] to get a urgent appt. I had to leave vm. I left my return call information. Referral was sent to [facility].

## 2019-11-25 NOTE — Telephone Encounter (Signed)
I called patient to make sure that he knew about appointment with Dr. Allen Norris tomorrow. I called patient & he stated that they had called him.

## 2019-11-25 NOTE — Telephone Encounter (Signed)
appt with wohl

## 2019-11-26 ENCOUNTER — Ambulatory Visit (INDEPENDENT_AMBULATORY_CARE_PROVIDER_SITE_OTHER): Payer: Medicare Other | Admitting: Gastroenterology

## 2019-11-26 ENCOUNTER — Other Ambulatory Visit: Payer: Self-pay

## 2019-11-26 ENCOUNTER — Encounter: Payer: Self-pay | Admitting: Gastroenterology

## 2019-11-26 VITALS — BP 113/69 | HR 72 | Ht 71.0 in

## 2019-11-26 DIAGNOSIS — K7460 Unspecified cirrhosis of liver: Secondary | ICD-10-CM

## 2019-11-26 NOTE — Progress Notes (Signed)
Gastroenterology Consultation  Referring Provider:     [name], FNP Primary Care Physician:  [name], FNP Primary Gastroenterologist:  Dr. Allen Norris     Reason for Consultation:     Portal hypertension        HPI:   [name]. is a 69 y.o. y/o male referred for consultation & management of portal hypertension by Dr. Vidal Hall, [name], FNP.  This patient comes to see me after being followed at [facility] for many years and had been in contact with them just a few days ago and was made an appointment for July to be seen at their liver clinic.  The patient had labs 13 days ago that showed normal liver enzymes except a bilirubin of 2.0 which showed on fractionation to be mostly indirect and not from the liver.  It appears from the record that the patient had a colonoscopy in 2017 that showed a tubular adenoma.  The patient's last imaging was in 2019 and it was a CT angiogram that showed:  IMPRESSION: VASCULAR  1. 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 2. 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 Patent celiac axis and IMA likely render this clinically insignificant. 3. 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 Subacute or chronic L2 compression fracture. If pain persists and intervention is considered, recommend MR lumbar spine or bone scan for further characterization of acuity. 2. Cirrhosis with portal venous hypertension as evidenced by splenomegaly, gastric varices, left splenorenal venous shunt. 3. Descending and sigmoid diverticulosis. 4. Cholelithiasis  The patient was recently seen by his primary care provider and blood work showed his platelets to be low.  The trending of his platelets were as follows since 2016:  Component     Latest Ref Rng & Units 01/06/2015 04/04/2018 04/07/2018 04/08/2018  Platelets     150.0 -  400.0 K/uL 63.0 (L) 42 (L) 52 (L) 55 (L)   Component     Latest Ref Rng & Units 03/21/2019 06/27/2019  Platelets     150.0 - 400.0 K/uL 65 (L) 56 (L)   Component     Latest Ref Rng & Units 11/13/2019  Platelets     150.0 - 400.0 K/uL 48.0 Repeated and verified X2. (LL)    It appears that the patient's hepatitis C viral load has been negative since 2014.  The patient reports that he has been doing well without any complaints.  The patient was started on propranolol by his primary care provider for his varices and states that his blood pressure has been doing very well.  There is no report of any abdominal pain black stools or bloody stools and he reports that he had a Hemoccult negative stool recently.  He also had his last colonoscopy in 2017.  The patient does have a history of adenomatous polyps.  He now reports that he like to transfer his care from [facility] to me since he is getting older and lives very close to the hospital.  Past Medical History:  Diagnosis Date  . 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 ANXIETY 03/24/2007  . Chronic pain   . CIRRHOSIS 03/24/2007  . 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 FRACTURE, WRIST, LEFT 10/12/2009   Qualifier: Diagnosis of  By: Loanne Drilling MD, Jacelyn Pi   . HEPATITIS C 03/24/2007  .  HYPERLIPIDEMIA 09/12/2009  . Hypocalcemia 12/23/2007   Qualifier: Diagnosis of  By: Loanne Drilling MD, Jacelyn Pi   . Pancytopenia 12/23/2007  . 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 UROLITHIASIS, HX OF 12/23/2007    Past Surgical History:  Procedure Laterality Date  . ABOVE KNEE LEG AMPUTATION Right    Right femur fracture, 11/1994, resulted in right AKA 2006 after staph infection    Prior to Admission medications   Medication Sig Start Date End Date Taking? Authorizing Provider  ALPRAZolam Duanne Moron) 1 MG tablet Take 1 mg by mouth 3 (three) times daily.      [provider]  Cholecalciferol (VITAMIN D3) 10 MCG  (400 UNIT) CAPS Take 4 capsules by mouth daily.    [provider]  hydrochlorothiazide (HYDRODIURIL) 12.5 MG tablet Take 1 tablet (12.5 mg total) by mouth daily. 09/14/19   [name], FNP  levothyroxine (SYNTHROID) 50 MCG tablet TAKE 1 TABLET(50 MCG) BY MOUTH DAILY BEFORE BREAKFAST 11/02/19   Renato Shin, MD  losartan (COZAAR) 100 MG tablet TAKE 1 TABLET(100 MG) BY MOUTH DAILY 09/14/19   [name], FNP  lubiprostone (AMITIZA) 24 MCG capsule Take 24 mcg by mouth daily as needed.  08/13/14   [provider]  NARCAN 4 MG/0.1ML LIQD nasal spray kit SPRAY 0.1 ML (4 MG) IN 1 NOSTRIL/ MAY REPEAT DOSE EVERY 2 3 MINUTES AS NEEDED ALTERNATING NOSTRILS 12/29/18   [provider]  oxycodone (ROXICODONE) 30 MG immediate release tablet Take 1 tablet by mouth 5 (five) times daily as needed. 03/24/18   [provider]  propranolol (INDERAL) 40 MG tablet Take 1 tablet (40 mg total) by mouth 2 (two) times daily. 11/13/19   [name], FNP  vitamin B-12 (CYANOCOBALAMIN) 1000 MCG tablet Take 1 tablet (1,000 mcg total) by mouth daily. 04/09/18   Bettey Costa, MD    Family History  Problem Relation Age of Onset  . Heart disease Mother   . Lung cancer Mother 62  . Heart attack Paternal Grandmother   . Heart attack Paternal Grandfather   . Liver cancer Neg Hx      Social History   Tobacco Use  . Smoking status: Never Smoker  . Smokeless tobacco: Never Used  Substance Use Topics  . Alcohol use: Not Currently    Alcohol/week: 0.0 standard drinks  . Drug use: No    Allergies as of 11/26/2019 - Review Complete 11/16/2019  Allergen Reaction Noted  . Clindamycin Dermatitis 07/08/2012    Review of Systems:    All systems reviewed and negative except where noted in HPI.   Physical Exam:  There were no vitals taken for this visit. No LMP for male patient. General:   Alert,  Well-developed, well-nourished, pleasant and cooperative in NAD Head:   Normocephalic and atraumatic. Eyes:  Sclera clear, no icterus.   Conjunctiva pink. Ears:  Normal auditory acuity. Neck:  Supple; no masses or thyromegaly. Lungs:  Respirations even and unlabored.  Clear throughout to auscultation.   No wheezes, crackles, or rhonchi. No acute distress. Heart:  Regular rate and rhythm; no murmurs, clicks, rubs, or gallops. Abdomen:  Normal bowel sounds.  No bruits.  Soft, non-tender and non-distended without masses, hepatosplenomegaly or hernias noted.  No guarding or rebound tenderness.  Negative Carnett sign.   Rectal:  Deferred.  Pulses:  Normal pulses noted. Extremities:  Right AKA. Neurologic:  Alert and oriented x3;  grossly normal neurologically. Skin:  Intact without significant lesions or rashes.  No  jaundice. Lymph Nodes:  No significant cervical adenopathy. Psych:  Alert and cooperative. Normal mood and affect.  Imaging Studies: No results found.  Assessment and Plan:   Hassan Blackshire Mazzola [name]. is a 69 y.o. y/o male who comes in today with a history of cirrhosis with gastric varices and portal hypertension who has thrombocytopenia.  As the labs show above his thrombocytopenia has been as low as 42 in the past and that has come back up in the 60s and is now back down to 48.  This is not uncommon in patients with cirrhosis and portal hypertension with splenomegaly.  There is nothing to do to alleviate that at this point.  The patient has not followed up with his [facility] surveillance.  The patient will be set up for an ultrasound of the right upper quadrant and blood work for alpha-fetoprotein.  The patient has been explained the plan and agrees with it.    Lucilla Lame, MD. Marval Regal    Note: This dictation was prepared with Dragon dictation along with smaller phrase technology. Any transcriptional errors that result from this process are unintentional.

## 2019-11-27 ENCOUNTER — Telehealth: Payer: Self-pay | Admitting: Family

## 2019-11-27 ENCOUNTER — Other Ambulatory Visit: Payer: Self-pay | Admitting: Family

## 2019-11-27 ENCOUNTER — Encounter: Payer: Self-pay | Admitting: Family

## 2019-11-27 DIAGNOSIS — R7989 Other specified abnormal findings of blood chemistry: Secondary | ICD-10-CM

## 2019-11-27 NOTE — Telephone Encounter (Signed)
Patient called and he will be on the look out for a call about appointment with hematology.

## 2019-11-27 NOTE — Telephone Encounter (Signed)
Call pt Glad he saw Dr Allen Norris yesterday and has est care for surveillance of cirrhosis  I dont want to overwhelm him however I would like to also involve hematology locally at [facility]. HE has seen oncology in the past with Dr Ralene Ok in 2009.  His WBCs are low and in the setting of elevated direct bilirubin and anemia, I want to ensure no further evaluation warranted Referral placed Let us know if you dont hear back within a week in regards to an appointment being scheduled.

## 2019-12-02 ENCOUNTER — Telehealth: Payer: Self-pay

## 2019-12-02 ENCOUNTER — Ambulatory Visit: Payer: Medicare Other | Attending: Gastroenterology

## 2019-12-02 NOTE — Telephone Encounter (Signed)
Patient did not show up for Ultrasound of RUQ this morning on 12/02/2019

## 2019-12-03 ENCOUNTER — Other Ambulatory Visit: Payer: Self-pay

## 2019-12-03 ENCOUNTER — Other Ambulatory Visit: Payer: Self-pay | Admitting: Endocrinology

## 2019-12-03 DIAGNOSIS — E039 Hypothyroidism, unspecified: Secondary | ICD-10-CM

## 2019-12-03 DIAGNOSIS — K7460 Unspecified cirrhosis of liver: Secondary | ICD-10-CM

## 2019-12-03 NOTE — Telephone Encounter (Signed)
Contacted pt regarding missed ultrasound appt. Rescheduled for Tuesday, May 11th @ 9:00am. Pt advised to arrive at 8:45am and to be NPO after midnight.

## 2019-12-08 ENCOUNTER — Ambulatory Visit: Payer: Medicare Other | Attending: Gastroenterology

## 2019-12-09 ENCOUNTER — Ambulatory Visit: Payer: Medicare Other | Admitting: Urology

## 2019-12-14 ENCOUNTER — Telehealth: Payer: Self-pay | Admitting: Family

## 2019-12-14 NOTE — Telephone Encounter (Signed)
Pt canceled apt he will call us back to r/s Urology

## 2019-12-14 NOTE — Telephone Encounter (Signed)
noted 

## 2019-12-14 NOTE — Telephone Encounter (Signed)
Pt declined referral for now & stated that it was nothing urgent. He said that he had been dealing with issues for years now. He recently fell this weekend so has cancelled some of his appointments due to being so sore. He wants to take care of his immediate needs first. He does plan to reschedule abdominal US Dr. Allen Norris ordered & plans next week to see Dr. Janese Banks as well as pain management.

## 2019-12-14 NOTE — Telephone Encounter (Signed)
call patient I see that he is declining urology consult.  Please let them know that I placed this consult as he stated he does not like the empties his bladder very well. This could mean prostate is enlarged.  This may be benign or from malignancy.  I would certainly advise with this consult and for him to call back urology

## 2019-12-16 ENCOUNTER — Ambulatory Visit: Payer: Medicare Other | Admitting: Family

## 2019-12-17 ENCOUNTER — Ambulatory Visit: Payer: Medicare Other | Admitting: Dermatology

## 2019-12-21 ENCOUNTER — Inpatient Hospital Stay: Payer: Medicare Other | Attending: Oncology | Admitting: Oncology

## 2019-12-21 ENCOUNTER — Emergency Department: Payer: Medicare Other

## 2019-12-21 ENCOUNTER — Other Ambulatory Visit: Payer: Self-pay

## 2019-12-21 ENCOUNTER — Encounter: Payer: Self-pay | Admitting: Oncology

## 2019-12-21 ENCOUNTER — Other Ambulatory Visit: Payer: Self-pay | Admitting: *Deleted

## 2019-12-21 ENCOUNTER — Telehealth: Payer: Medicare Other | Admitting: Pain Medicine

## 2019-12-21 ENCOUNTER — Inpatient Hospital Stay: Payer: Medicare Other

## 2019-12-21 ENCOUNTER — Emergency Department
Admission: EM | Admit: 2019-12-21 | Discharge: 2019-12-21 | Disposition: A | Discharge status: Home / Self Care | Payer: Medicare Other | Attending: Emergency Medicine | Admitting: Emergency Medicine

## 2019-12-21 ENCOUNTER — Encounter: Payer: Self-pay | Admitting: *Deleted

## 2019-12-21 VITALS — BP 131/66 | HR 69 | Temp 98.4°F | Resp 18 | Wt 210.0 lb

## 2019-12-21 DIAGNOSIS — Z801 Family history of malignant neoplasm of trachea, bronchus and lung: Secondary | ICD-10-CM | POA: Diagnosis not present

## 2019-12-21 DIAGNOSIS — I517 Cardiomegaly: Secondary | ICD-10-CM | POA: Diagnosis not present

## 2019-12-21 DIAGNOSIS — D649 Anemia, unspecified: Secondary | ICD-10-CM | POA: Diagnosis not present

## 2019-12-21 DIAGNOSIS — F419 Anxiety disorder, unspecified: Secondary | ICD-10-CM | POA: Insufficient documentation

## 2019-12-21 DIAGNOSIS — Z89611 Acquired absence of right leg above knee: Secondary | ICD-10-CM | POA: Insufficient documentation

## 2019-12-21 DIAGNOSIS — G894 Chronic pain syndrome: Secondary | ICD-10-CM | POA: Insufficient documentation

## 2019-12-21 DIAGNOSIS — E039 Hypothyroidism, unspecified: Secondary | ICD-10-CM | POA: Insufficient documentation

## 2019-12-21 DIAGNOSIS — K7290 Hepatic failure, unspecified without coma: Secondary | ICD-10-CM | POA: Diagnosis not present

## 2019-12-21 DIAGNOSIS — R569 Unspecified convulsions: Secondary | ICD-10-CM | POA: Diagnosis present

## 2019-12-21 DIAGNOSIS — J811 Chronic pulmonary edema: Secondary | ICD-10-CM | POA: Insufficient documentation

## 2019-12-21 DIAGNOSIS — E785 Hyperlipidemia, unspecified: Secondary | ICD-10-CM | POA: Insufficient documentation

## 2019-12-21 DIAGNOSIS — R17 Unspecified jaundice: Secondary | ICD-10-CM | POA: Diagnosis not present

## 2019-12-21 DIAGNOSIS — R4189 Other symptoms and signs involving cognitive functions and awareness: Secondary | ICD-10-CM

## 2019-12-21 DIAGNOSIS — K7460 Unspecified cirrhosis of liver: Secondary | ICD-10-CM | POA: Diagnosis not present

## 2019-12-21 DIAGNOSIS — Z85828 Personal history of other malignant neoplasm of skin: Secondary | ICD-10-CM | POA: Insufficient documentation

## 2019-12-21 DIAGNOSIS — D696 Thrombocytopenia, unspecified: Secondary | ICD-10-CM | POA: Diagnosis not present

## 2019-12-21 DIAGNOSIS — Z79899 Other long term (current) drug therapy: Secondary | ICD-10-CM | POA: Insufficient documentation

## 2019-12-21 DIAGNOSIS — I10 Essential (primary) hypertension: Secondary | ICD-10-CM | POA: Diagnosis not present

## 2019-12-21 DIAGNOSIS — I119 Hypertensive heart disease without heart failure: Secondary | ICD-10-CM | POA: Insufficient documentation

## 2019-12-21 DIAGNOSIS — D61818 Other pancytopenia: Secondary | ICD-10-CM | POA: Diagnosis present

## 2019-12-21 DIAGNOSIS — Z20822 Contact with and (suspected) exposure to covid-19: Secondary | ICD-10-CM | POA: Insufficient documentation

## 2019-12-21 DIAGNOSIS — Z0189 Encounter for other specified special examinations: Secondary | ICD-10-CM

## 2019-12-21 DIAGNOSIS — Z881 Allergy status to other antibiotic agents status: Secondary | ICD-10-CM | POA: Diagnosis not present

## 2019-12-21 DIAGNOSIS — J9811 Atelectasis: Secondary | ICD-10-CM | POA: Insufficient documentation

## 2019-12-21 LAB — URINE DRUG SCREEN, QUALITATIVE (ARMC ONLY)
Amphetamines, Ur Screen: NOT DETECTED
Barbiturates, Ur Screen: NOT DETECTED
Benzodiazepine, Ur Scrn: NOT DETECTED
Cannabinoid 50 Ng, Ur ~~LOC~~: NOT DETECTED
Cocaine Metabolite,Ur ~~LOC~~: NOT DETECTED
MDMA (Ecstasy)Ur Screen: NOT DETECTED
Methadone Scn, Ur: NOT DETECTED
Opiate, Ur Screen: POSITIVE — AB
Phencyclidine (PCP) Ur S: NOT DETECTED
Tricyclic, Ur Screen: NOT DETECTED

## 2019-12-21 LAB — CBC WITH DIFFERENTIAL/PLATELET
Abs Immature Granulocytes: 0.1 10*3/uL — ABNORMAL HIGH (ref 0.00–0.07)
Basophils Absolute: 0 10*3/uL (ref 0.0–0.1)
Basophils Relative: 1 %
Eosinophils Absolute: 0.1 10*3/uL (ref 0.0–0.5)
Eosinophils Relative: 2 %
HCT: 28.8 % — ABNORMAL LOW (ref 39.0–52.0)
Hemoglobin: 9.7 g/dL — ABNORMAL LOW (ref 13.0–17.0)
Immature Granulocytes: 3 %
Lymphocytes Relative: 25 %
Lymphs Abs: 0.9 10*3/uL (ref 0.7–4.0)
MCH: 32.4 pg (ref 26.0–34.0)
MCHC: 33.7 g/dL (ref 30.0–36.0)
MCV: 96.3 fL (ref 80.0–100.0)
Monocytes Absolute: 0.3 10*3/uL (ref 0.1–1.0)
Monocytes Relative: 8 %
Neutro Abs: 2.1 10*3/uL (ref 1.7–7.7)
Neutrophils Relative %: 61 %
Platelets: 65 10*3/uL — ABNORMAL LOW (ref 150–400)
RBC: 2.99 MIL/uL — ABNORMAL LOW (ref 4.22–5.81)
RDW: 19.9 % — ABNORMAL HIGH (ref 11.5–15.5)
WBC: 3.5 10*3/uL — ABNORMAL LOW (ref 4.0–10.5)
nRBC: 0 % (ref 0.0–0.2)

## 2019-12-21 LAB — FOLATE: Folate: 6.6 ng/mL (ref >5.9)

## 2019-12-21 LAB — CBC
HCT: 30.2 % — ABNORMAL LOW (ref 39.0–52.0)
Hemoglobin: 10.3 g/dL — ABNORMAL LOW (ref 13.0–17.0)
MCH: 32.9 pg (ref 26.0–34.0)
MCHC: 34.1 g/dL (ref 30.0–36.0)
MCV: 96.5 fL (ref 80.0–100.0)
Platelets: 99 10*3/uL — ABNORMAL LOW (ref 150–400)
RBC: 3.13 MIL/uL — ABNORMAL LOW (ref 4.22–5.81)
RDW: 20.3 % — ABNORMAL HIGH (ref 11.5–15.5)
WBC: 6.2 10*3/uL (ref 4.0–10.5)
nRBC: 0.8 % — ABNORMAL HIGH (ref 0.0–0.2)

## 2019-12-21 LAB — IRON AND TIBC
Iron: 129 ug/dL (ref 45–182)
Saturation Ratios: 30 % (ref 17.9–39.5)
TIBC: 434 ug/dL (ref 250–450)
UIBC: 305 ug/dL

## 2019-12-21 LAB — BASIC METABOLIC PANEL
Anion gap: 10 (ref 5–15)
BUN: 19 mg/dL (ref 8–23)
CO2: 28 mmol/L (ref 22–32)
Calcium: 9.3 mg/dL (ref 8.9–10.3)
Chloride: 98 mmol/L (ref 98–111)
Creatinine, Ser: 1.06 mg/dL (ref 0.61–1.24)
GFR calc Af Amer: >60 mL/min (ref >60)
GFR calc non Af Amer: >60 mL/min (ref >60)
Glucose, Bld: 123 mg/dL — ABNORMAL HIGH (ref 70–99)
Potassium: 4.1 mmol/L (ref 3.5–5.1)
Sodium: 136 mmol/L (ref 135–145)

## 2019-12-21 LAB — COMPREHENSIVE METABOLIC PANEL
ALT: 25 U/L (ref 0–44)
AST: 29 U/L (ref 15–41)
Albumin: 4.7 g/dL (ref 3.5–5.0)
Alkaline Phosphatase: 72 U/L (ref 38–126)
Anion gap: 11 (ref 5–15)
BUN: 16 mg/dL (ref 8–23)
CO2: 27 mmol/L (ref 22–32)
Calcium: 9.5 mg/dL (ref 8.9–10.3)
Chloride: 97 mmol/L — ABNORMAL LOW (ref 98–111)
Creatinine, Ser: 0.95 mg/dL (ref 0.61–1.24)
GFR calc Af Amer: >60 mL/min (ref >60)
GFR calc non Af Amer: >60 mL/min (ref >60)
Glucose, Bld: 142 mg/dL — ABNORMAL HIGH (ref 70–99)
Potassium: 3.5 mmol/L (ref 3.5–5.1)
Sodium: 135 mmol/L (ref 135–145)
Total Bilirubin: 3.1 mg/dL — ABNORMAL HIGH (ref 0.3–1.2)
Total Protein: 8.1 g/dL (ref 6.5–8.1)

## 2019-12-21 LAB — FERRITIN: Ferritin: 540 ng/mL — ABNORMAL HIGH (ref 24–336)

## 2019-12-21 LAB — PROTIME-INR
INR: 1.2 (ref 0.8–1.2)
Prothrombin Time: 14.6 seconds (ref 11.4–15.2)

## 2019-12-21 LAB — URINALYSIS, COMPLETE (UACMP) WITH MICROSCOPIC
Bacteria, UA: NONE SEEN
Bilirubin Urine: NEGATIVE
Glucose, UA: NEGATIVE mg/dL
Hgb urine dipstick: NEGATIVE
Ketones, ur: NEGATIVE mg/dL
Leukocytes,Ua: NEGATIVE
Nitrite: NEGATIVE
Protein, ur: NEGATIVE mg/dL
Specific Gravity, Urine: 1.018 (ref 1.005–1.030)
Squamous Epithelial / HPF: NONE SEEN (ref 0–5)
pH: 5 (ref 5.0–8.0)

## 2019-12-21 LAB — HEPATIC FUNCTION PANEL
ALT: 22 U/L (ref 0–44)
AST: 31 U/L (ref 15–41)
Albumin: 4.2 g/dL (ref 3.5–5.0)
Alkaline Phosphatase: 62 U/L (ref 38–126)
Bilirubin, Direct: 0.3 mg/dL — ABNORMAL HIGH (ref 0.0–0.2)
Indirect Bilirubin: 3 mg/dL — ABNORMAL HIGH (ref 0.3–0.9)
Total Bilirubin: 3.3 mg/dL — ABNORMAL HIGH (ref 0.3–1.2)
Total Protein: 7.2 g/dL (ref 6.5–8.1)

## 2019-12-21 LAB — LACTIC ACID, PLASMA: Lactic Acid, Venous: 1.5 mmol/L (ref 0.5–1.9)

## 2019-12-21 LAB — SARS CORONAVIRUS 2 BY RT PCR (HOSPITAL ORDER, PERFORMED IN ~~LOC~~ HOSPITAL LAB): SARS Coronavirus 2: NEGATIVE

## 2019-12-21 LAB — LACTATE DEHYDROGENASE: LDH: 256 U/L — ABNORMAL HIGH (ref 98–192)

## 2019-12-21 LAB — APTT: aPTT: 32 seconds (ref 24–36)

## 2019-12-21 LAB — BILIRUBIN, FRACTIONATED(TOT/DIR/INDIR)
Bilirubin, Direct: 0.4 mg/dL — ABNORMAL HIGH (ref 0.0–0.2)
Indirect Bilirubin: 2.7 mg/dL — ABNORMAL HIGH (ref 0.3–0.9)
Total Bilirubin: 3.1 mg/dL — ABNORMAL HIGH (ref 0.3–1.2)

## 2019-12-21 LAB — TECHNOLOGIST SMEAR REVIEW: Plt Morphology: DECREASED

## 2019-12-21 LAB — RETICULOCYTES
Immature Retic Fract: 29.8 % — ABNORMAL HIGH (ref 2.3–15.9)
RBC.: 3.02 MIL/uL — ABNORMAL LOW (ref 4.22–5.81)
Retic Count, Absolute: 318.6 10*3/uL — ABNORMAL HIGH (ref 19.0–186.0)
Retic Ct Pct: 10.6 % — ABNORMAL HIGH (ref 0.4–3.1)

## 2019-12-21 LAB — DAT, POLYSPECIFIC AHG (ARMC ONLY): Polyspecific AHG test: NEGATIVE

## 2019-12-21 LAB — VITAMIN B12: Vitamin B-12: 1441 pg/mL — ABNORMAL HIGH (ref 180–914)

## 2019-12-21 LAB — TROPONIN I (HIGH SENSITIVITY)
Troponin I (High Sensitivity): 6 ng/L (ref <18)
Troponin I (High Sensitivity): 13 ng/L (ref <18)

## 2019-12-21 LAB — GLUCOSE, CAPILLARY: Glucose-Capillary: 123 mg/dL — ABNORMAL HIGH (ref 70–99)

## 2019-12-21 LAB — AMMONIA: Ammonia: 48 umol/L — ABNORMAL HIGH (ref 9–35)

## 2019-12-21 MED ORDER — MIDAZOLAM HCL 5 MG/5ML IJ SOLN
2.0000 mg | Freq: Once | INTRAMUSCULAR | Status: AC
  Administered 2019-12-21: 2 mg via INTRAVENOUS

## 2019-12-21 MED ORDER — SODIUM CHLORIDE 0.9% FLUSH: 3.0000 mL | Freq: Once | INTRAVENOUS | Status: DC

## 2019-12-21 MED ORDER — FENTANYL CITRATE (PF) 100 MCG/2ML IJ SOLN
50.0000 ug | Freq: Once | INTRAMUSCULAR | Status: AC
  Administered 2019-12-21: 50 ug via INTRAVENOUS

## 2019-12-21 MED ORDER — NOREPINEPHRINE 4 MG/250ML-% IV SOLN
0.0000 ug/min | INTRAVENOUS | Status: DC
  Administered 2019-12-21: 5 ug/min via INTRAVENOUS

## 2019-12-21 MED ORDER — NOREPINEPHRINE 4 MG/250ML-% IV SOLN
INTRAVENOUS | Status: AC
  Filled 2019-12-21: qty 250

## 2019-12-21 MED ORDER — ROCURONIUM BROMIDE 50 MG/5ML IV SOLN
30.0000 mg | Freq: Once | INTRAVENOUS | Status: AC
  Administered 2019-12-21: 30 mg via INTRAVENOUS
  Filled 2019-12-21: qty 3

## 2019-12-21 MED ORDER — PROPOFOL 1000 MG/100ML IV EMUL
INTRAVENOUS | Status: AC
  Filled 2019-12-21: qty 100

## 2019-12-21 MED ORDER — ETOMIDATE 2 MG/ML IV SOLN
30.0000 mg | Freq: Once | INTRAVENOUS | Status: AC
  Administered 2019-12-21: 30 mg via INTRAVENOUS

## 2019-12-21 NOTE — ED Notes (Signed)
Son in room with pt again.

## 2019-12-21 NOTE — Discharge Instructions (Addendum)
Please feel free to return at any time if you change your mind about admission. Please seek medical attention for any high fevers, chest pain, shortness of breath, change in behavior, persistent vomiting, bloody stool or any other new or concerning symptoms.

## 2019-12-21 NOTE — ED Triage Notes (Signed)
Pt brought in via ems from home with witnessed seizures.  Pt bagged on arrival to ER by EMS  Iv in place/  Versed 5mg  given by EMS  md and RT at bedside.

## 2019-12-21 NOTE — ED Notes (Addendum)
Pt brought in via ems from home with seizures witnessed by EMS.  Pt had a recent fall and has red area to center of forehead.  No hx seizures.  Pt has right aka . Pt bagged on arrival by ems with oral airway in place.  Ems gave 5 mg ov versed en route.  Pt intubated by dr Archie Balboa with 7.5 ETT and placed on the vent after meds given for sedation.  nsr on monitor.

## 2019-12-21 NOTE — ED Notes (Addendum)
Pt extubated by RT.  Pt talking.    md with pt.  nsr on monitor.  Pt on 4 liters oxygen [place]

## 2019-12-21 NOTE — ED Notes (Signed)
Pt awake.   md wants to extubate pt.  RT at bedside.  md at bedside.

## 2019-12-21 NOTE — ED Notes (Signed)
Return from ct scan  meds given  Rt with pt and md.

## 2019-12-21 NOTE — ED Notes (Addendum)
Son at bedside with pt.   Offered son chaplain to room, son states not at this time.

## 2019-12-21 NOTE — ED Notes (Signed)
Levophed turned off per dr Archie Balboa order.

## 2019-12-21 NOTE — Progress Notes (Signed)
Patient extubated per Dr. Montine Circle. Patient now on 4L nasal cannula.

## 2019-12-21 NOTE — ED Notes (Addendum)
To ct scan with RT and RN  Levophed infusing   md with pt to ct scan

## 2019-12-21 NOTE — ED Notes (Signed)
1 blue tennis shoe, shorts, wallet, blue underwear given to son.

## 2019-12-21 NOTE — ED Notes (Signed)
fsbs 126 on arrival

## 2019-12-21 NOTE — ED Provider Notes (Signed)
[name] Emergency Department Provider Note   ____________________________________________   I have reviewed the triage vital signs and the nursing notes.   HISTORY  Chief Complaint Seizures   History limited by and level 5 caveat due to: Unresponsiveness, hx obtained from EMS and family   HPI [name]. is a 69 y.o. male who presents to the emergency department today via EMS as emergency traffic because of concerns for she seizure-like activity.  Per the son they were actually in the middle of discussing a living well when he started having full body tonic-clonic motion.  When EMS arrived he was still having the seizure activity.  He was given Versed. Oral airway was placed and patient was being bagged. Son states that he was seen in oncology clinic earlier today.  Records reviewed. Per medical record review patient has a history of cirrhosis, right aka.   Past Medical History:  Diagnosis Date  . 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 ANXIETY 03/24/2007  . Chronic pain   . CIRRHOSIS 03/24/2007  . 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 FRACTURE, WRIST, LEFT 10/12/2009   Qualifier: Diagnosis of  By: Loanne Drilling MD, Jacelyn Pi   . HEPATITIS C 03/24/2007  . HYPERLIPIDEMIA 09/12/2009  . Hypocalcemia 12/23/2007   Qualifier: Diagnosis of  By: Loanne Drilling MD, Jacelyn Pi   . Pancytopenia 12/23/2007  . 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 Squamous cell carcinoma of skin 05/21/2018   R mid volar forearm  . Thyroid disease   . UROLITHIASIS, HX OF 12/23/2007    Patient Active Problem List   Diagnosis Date Noted  . HTN (hypertension) 09/14/2019  . Hepatic encephalopathy ([place] 04/07/2018  . Chronic pain syndrome 11/23/2016  . Hypothyroidism 09/25/2016  . 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 Cellulitis of right thigh 05/21/2012  . Pancytopenia ([place]) 02/26/2012   . Encounter for long-term (current) use of other medications 02/26/2012  . Screening for prostate cancer 02/26/2012  . Anemia 02/26/2012  . Closed fracture of carpal bone 10/12/2009  . Dyslipidemia 09/12/2009  . Phantom limb syndrome ([place]) 09/12/2009  . HYPOCALCEMIA 12/23/2007  . 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 UROLITHIASIS, HX OF 12/23/2007  . Anxiety 03/24/2007  . Hepatic cirrhosis ([place]) 03/24/2007    Past Surgical History:  Procedure Laterality Date  . ABOVE KNEE LEG AMPUTATION Right    Right femur fracture, 11/1994, resulted in right AKA 2006 after staph infection    Prior to Admission medications   Medication Sig Start Date End Date Taking? Authorizing Provider  ALPRAZolam Duanne Moron) 1 MG tablet Take 1 mg by mouth 3 (three) times daily.      [provider]  Cholecalciferol (VITAMIN D3) 10 MCG (400 UNIT) CAPS Take 4 capsules by mouth daily.    [provider]  hydrochlorothiazide (HYDRODIURIL) 12.5 MG tablet Take 1 tablet (12.5 mg total) by mouth daily. 09/14/19   [name], FNP  levothyroxine (SYNTHROID) 50 MCG tablet TAKE 1 TABLET(50 MCG) BY MOUTH DAILY BEFORE BREAKFAST 12/03/19   Renato Shin, MD  losartan (COZAAR) 100 MG tablet TAKE 1 TABLET(100 MG) BY MOUTH DAILY 09/14/19   [name], FNP  lubiprostone (AMITIZA) 24 MCG capsule Take 24 mcg by mouth daily as needed.  08/13/14   [provider]  NARCAN 4 MG/0.1ML LIQD nasal spray kit SPRAY 0.1 ML (4 MG) IN 1  NOSTRIL/ MAY REPEAT DOSE EVERY 2 3 MINUTES AS NEEDED ALTERNATING NOSTRILS 12/29/18   [provider]  oxycodone (ROXICODONE) 30 MG immediate release tablet Take 1 tablet by mouth 5 (five) times daily as needed. 03/24/18   [provider]  propranolol (INDERAL) 40 MG tablet Take 1 tablet (40 mg total) by mouth 2 (two) times daily. 11/13/19   [name], FNP  vitamin B-12 (CYANOCOBALAMIN) 1000 MCG tablet Take 1 tablet (1,000 mcg total) by mouth daily.  04/09/18   Bettey Costa, MD    Allergies Clindamycin  Family History  Problem Relation Age of Onset  . Heart disease Mother   . Lung cancer Mother 2  . Heart attack Paternal Grandmother   . Heart attack Paternal Grandfather   . Liver cancer Neg Hx     Social History Social History   Tobacco Use  . Smoking status: Never Smoker  . Smokeless tobacco: Never Used  Substance Use Topics  . Alcohol use: Not Currently    Alcohol/week: 0.0 standard drinks  . Drug use: No    Review of Systems Unable to obtain secondary to unresponsiveness ____________________________________________   PHYSICAL EXAM:  VITAL SIGNS: ED Triage Vitals  Enc Vitals Group     BP 12/21/19 1607 (!) 85/41     Pulse Rate 12/21/19 1607 62     Resp 12/21/19 1607 16     Temp --      Temp src --      SpO2 12/21/19 1607 100 %     Weight 12/21/19 1608 210 lb (95.3 kg)     Height 12/21/19 1608 6' (1.829 m)   Constitutional: Unresponsive.  Eyes: Conjunctivae are normal.  ENT      Head: Normocephalic and atraumatic.      Nose: No congestion/rhinnorhea.      Mouth/Throat: Mucous membranes are moist.      Neck: No stridor. Hematological/Lymphatic/Immunilogical: No cervical lymphadenopathy. Cardiovascular: Normal rate, regular rhythm.  No murmurs, rubs, or gallops.  Respiratory: BVM. Breath sounds clear bilaterally. Gastrointestinal: Soft and non tender. No rebound. No guarding.  Genitourinary: Deferred Musculoskeletal: s/p right aka. Neurologic:  Unresponsive. Minimal gag reflex. No response to sternal rub. Skin:  Skin is warm, dry and intact. No rash noted.  ____________________________________________    LABS (pertinent positives/negatives)  BMP wnl except glu 123 Trop hs 6 Hepatic function panel wnl except t bili 3.3, bili d 0.3, indirect bili 3.0 Ammonia 48 INR 1.2 UA clear, unremarkable  ____________________________________________   EKG  I, Nance Pear, attending physician,  personally viewed and interpreted this EKG  EKG Time: 1607 Rate: 61 Rhythm: sinus rhythm Axis: left axis deviation Intervals: qtc 460 QRS: narrow ST changes: no st elevation Impression: abnormal ekg  ____________________________________________    RADIOLOGY  CT head No acute intracranial process  CXR ET tube in satisfactory position, mild pulmonary edema.  ____________________________________________   PROCEDURES  Procedures  CRITICAL CARE Performed by: Nance Pear   Total critical care time: 40 minutes  Critical care time was exclusive of separately billable procedures and treating other patients.  Critical care was necessary to treat or prevent imminent or life-threatening deterioration.  Critical care was time spent personally by me on the following activities: development of treatment plan with patient and/or surrogate as well as nursing, discussions with consultants, evaluation of patient's response to treatment, examination of patient, obtaining history from patient or surrogate, ordering and performing treatments and interventions, ordering and review of laboratory studies, ordering and review of  radiographic studies, pulse oximetry and re-evaluation of patient's condition.  INTUBATION Performed by: Nance Pear  Required items: required blood products, implants, devices, and special equipment available Patient identity confirmed: provided demographic data and hospital-assigned identification number Time out: Immediately prior to procedure a "time out" was called to verify the correct patient, procedure, equipment, support staff and site/side marked as required.  Indications: unresponsiveness, airway protection  Intubation method: Glidescope   Preoxygenation: BVM  Sedatives: Etomidate Paralytic: Rocuronium   Tube Size: 7.5 cuffed  Post-procedure assessment: chest rise and ETCO2 monitor Breath sounds: equal and absent over the epigastrium Tube  secured with: ETT holder Chest x-ray interpreted by radiologist and me.  Chest x-ray findings: endotracheal tube in appropriate position  Patient tolerated the procedure well with no immediate complications.    ____________________________________________   INITIAL IMPRESSION / ASSESSMENT AND PLAN / ED COURSE  Pertinent labs & imaging results that were available during my care of the patient were reviewed by me and considered in my medical decision making (see chart for details).   Patient presented to the emergency department today via EMS because of concerns for seizure and decreased responsiveness. MS did give the patient Versed on route. They were bagging patient and placed an oral airway. On my initial exam patient did have spontaneous breathing. Very minimal gag reflex and no response to sternal rub. I did discussion with the son at this time is that the patient would want to be intubated for further evaluation however would not want to be intubated if there was a known terminal or nonrecoverable process. Given concern for airway protection as well as decreased responsiveness and possible intracranial lesion the patient was intubated. Patient tolerated intubation well. Stat head CT was performed which did not show any bleed. Broad blood work was initiated. Sometime after CT scan the patient did appear to wake up. He was able to mouth words over his ET tube. Given this level of alertness the decision was made to extubate the patient. The patient tolerated extubation well without any breathing difficulty. Blood work without obvious etiology of the patient's seizure. Ammonia is slightly elevated however is not far off of previous results. The son stated the patient was back to his baseline mental status. The patient did not want to be admitted to the hospital. I had a long discussion with the patient discussed my concerns about further seizure with resultant neurologic dysfunction or death. Also  discussed that further tests could be performed to try to find etiology of the patient's seizure. The patient was able to verbalize back to me my concerns. At this time given his level of alertness and able to answer questions appropriately and verbalize my response I do not feel he lacks capacity to make his decisions. I had another discussion with the son who felt also unwilling to want to override his father's wishes at this time. I did have a discussion with the patient about importance of returning for any concern.  ____________________________________________   FINAL CLINICAL IMPRESSION(S) / ED DIAGNOSES  Final diagnoses:  Seizure ([place])  Unresponsiveness     Note: This dictation was prepared with Dragon dictation. Any transcriptional errors that result from this process are unintentional     Nance Pear, MD 12/21/19 1859

## 2019-12-21 NOTE — Progress Notes (Signed)
Transported patient to and from CT with no issues. 

## 2019-12-21 NOTE — ED Notes (Signed)
Pt alert and talking  Oxygen in place.  Foley in place.  nsr on monitor.  Skin warm and dry.  Son at bedside.

## 2019-12-21 NOTE — ED Notes (Signed)
nsr on monitor

## 2019-12-21 NOTE — Progress Notes (Signed)
Patient here for initial oncology appointment, expresses complaints of new bruises otherwise no other concerns.

## 2019-12-22 LAB — HAPTOGLOBIN: Haptoglobin: <10 mg/dL — ABNORMAL LOW (ref 32–363)

## 2019-12-22 LAB — AFP TUMOR MARKER: AFP, Serum, Tumor Marker: 4.3 ng/mL (ref 0.0–8.3)

## 2019-12-23 LAB — MULTIPLE MYELOMA PANEL, SERUM
Albumin SerPl Elph-Mcnc: 4.3 g/dL (ref 2.9–4.4)
Albumin/Glob SerPl: 1.4 (ref 0.7–1.7)
Alpha 1: 0.3 g/dL (ref 0.0–0.4)
Alpha2 Glob SerPl Elph-Mcnc: 0.6 g/dL (ref 0.4–1.0)
B-Globulin SerPl Elph-Mcnc: 1.1 g/dL (ref 0.7–1.3)
Gamma Glob SerPl Elph-Mcnc: 1.3 g/dL (ref 0.4–1.8)
Globulin, Total: 3.3 g/dL (ref 2.2–3.9)
IgA: 297 mg/dL (ref 61–437)
IgG (Immunoglobin G), Serum: 1150 mg/dL (ref 603–1613)
IgM (Immunoglobulin M), Srm: 113 mg/dL (ref 20–172)
Total Protein ELP: 7.6 g/dL (ref 6.0–8.5)

## 2019-12-24 ENCOUNTER — Encounter: Payer: Self-pay | Admitting: Oncology

## 2019-12-24 NOTE — Progress Notes (Signed)
Hematology/Oncology Consult note [facility] [phone] [phone]  Patient Care Team: [name], FNP as PCP - General (Family Medicine) Sable Feil, MD as Attending Physician (Gastroenterology) Shanon Ace, MD as Referring Physician (Anesthesiology) Jari Pigg, MD as Attending Physician (Dermatology) Mahalia Longest (Internal Medicine) Norma Fredrickson, MD as Consulting Physician (Psychiatry)   Name of the patient: [name]  [id number]  Dec 10, 1950    Reason for referral-pancytopenia   Referring physician-Margaret Arnett, FNP  Date of visit: 12/24/19   History of presenting illness- Patient is a 69 year old male with a past medical history significant for hepatitis C, prior history of alcohol intake, cirrhosis, 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  He has seen Dr. Allen Norris for cirrhosis in the past.  He has not had any recent episodes of ascites.  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  With regards to his CBC patient has had baseline thrombocytopenia and his platelet counts mainly fluctuate between 40-60 at least since 2015.  He also has chronic leukopenia with a white count that fluctuates between 2.3-3.5 at least dating back to 2015.  Differential has mainly showed lymphopenia but at times neutropenia.  Hemoglobin has been mostly between 9.5-10.5 most recently patient was found to have a white count of 3.5, H&H of 9.7/28.8 and a platelet count of 65 and has been referred to [place] for pancytopenia.  In terms of his CMP he has had elevated bilirubin which fluctuates between 1.6-2.9 in the past but was recently noted to have a bilirubin of 3.1 mostly indirect hyperbilirubinemia with normal AST and ALT  Patient uses a motorized wheelchair for ambulation but is independent of his ADLs and IADLs and is able to drive as well.  Appetite and weight have been  stable.  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  Currently patient denies any bleeding in his stool or urine.  Denies any dark tarry stools  ECOG PS- 1  Pain scale- 0   Review of systems- Review of Systems  Constitutional: Positive for malaise/fatigue. Negative for chills, fever and weight loss.  HENT: Negative for congestion, ear discharge and nosebleeds.   Eyes: Negative for blurred vision.  Respiratory: Negative for cough, hemoptysis, sputum production, shortness of breath and wheezing.   Cardiovascular: Negative for chest pain, palpitations, orthopnea and claudication.  Gastrointestinal: Negative for abdominal pain, blood in stool, constipation, diarrhea, heartburn, melena, nausea and vomiting.  Genitourinary: Negative for dysuria, flank pain, frequency, hematuria and urgency.  Musculoskeletal: Negative for back pain, joint pain and myalgias.  Skin: Negative for rash.  Neurological: Negative for dizziness, tingling, focal weakness, seizures, weakness and headaches.  Endo/Heme/Allergies: Does not bruise/bleed easily.  Psychiatric/Behavioral: Negative for depression and suicidal ideas. The patient does not have insomnia.     Allergies  Allergen Reactions  . Clindamycin Dermatitis    Patient Active Problem List   Diagnosis Date Noted  . HTN (hypertension) 09/14/2019  . Hepatic encephalopathy ([place]) 04/07/2018  . Chronic pain syndrome 11/23/2016  . Hypothyroidism 09/25/2016  . 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 Cellulitis of right thigh 05/21/2012  . Pancytopenia ([place]) 02/26/2012  . Encounter for long-term (current) use of other medications 02/26/2012  . Screening for prostate cancer 02/26/2012  . Anemia 02/26/2012  . Closed fracture of carpal bone 10/12/2009  .  Dyslipidemia 09/12/2009  . Phantom limb syndrome  ([place]) 09/12/2009  . HYPOCALCEMIA 12/23/2007  . 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 UROLITHIASIS, HX OF 12/23/2007  . Anxiety 03/24/2007  . Hepatic cirrhosis ([place]) 03/24/2007     Past Medical History:  Diagnosis Date  . 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 ANXIETY 03/24/2007  . Chronic pain   . CIRRHOSIS 03/24/2007  . 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 FRACTURE, WRIST, LEFT 10/12/2009   Qualifier: Diagnosis of  By: Loanne Drilling MD, Jacelyn Pi   . HEPATITIS C 03/24/2007  . HYPERLIPIDEMIA 09/12/2009  . Hypocalcemia 12/23/2007   Qualifier: Diagnosis of  By: Loanne Drilling MD, Jacelyn Pi   . Pancytopenia 12/23/2007  . 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 Squamous cell carcinoma of skin 05/21/2018   R mid volar forearm  . Thyroid disease   . UROLITHIASIS, HX OF 12/23/2007     Past Surgical History:  Procedure Laterality Date  . ABOVE KNEE LEG AMPUTATION Right    Right femur fracture, 11/1994, resulted in right AKA 2006 after staph infection    Social History   Socioeconomic History  . Marital status: Widowed    Spouse name: Not on file  . Number of children: Not on file  . Years of education: Not on file  . Highest education level: Not on file  Occupational History  . Occupation: Disabled  Tobacco Use  . Smoking status: Never Smoker  . Smokeless tobacco: Never Used  Substance and Sexual Activity  . Alcohol use: Not Currently    Alcohol/week: 0.0 standard drinks  . Drug use: No  . Sexual activity: Not on file  Other Topics Concern  . Not on file  Social History Narrative   Widowed 1997   Lives alone   Lost a son in his arms in 23.    3 sons   Social Determinants of Health   Financial Resource Strain:   . Difficulty of Paying Living Expenses:   Food Insecurity:   . Worried About Charity fundraiser in the Last Year:   . Arboriculturist in the Last Year:   Transportation Needs:   . Film/video editor (Medical):   Marland Kitchen  Lack of Transportation (Non-Medical):   Physical Activity:   . Days of Exercise per Week:   . Minutes of Exercise per Session:   Stress:   . Feeling of Stress :   Social Connections:   . Frequency of Communication with Friends and Family:   . Frequency of Social Gatherings with Friends and Family:   . Attends Religious Services:   . Active Member of Clubs or Organizations:   . Attends Archivist Meetings:   Marland Kitchen Marital Status:   Intimate Partner Violence:   . Fear of Current or Ex-Partner:   . Emotionally Abused:   Marland Kitchen Physically Abused:   . Sexually Abused:      Family History  Problem Relation Age of Onset  . Heart disease Mother   . Lung cancer Mother 13  . Heart attack Paternal Grandmother   . Heart attack Paternal Grandfather   . Liver cancer Neg Hx      Current Outpatient Medications:  .  ALPRAZolam (XANAX) 1 MG tablet, Take 1 mg by mouth 3 (three) times daily.  , Disp: , Rfl:  .  Cholecalciferol (VITAMIN D3) 10 MCG (400 UNIT) CAPS, Take 4 capsules by mouth daily., Disp: ,  Rfl:  .  hydrochlorothiazide (HYDRODIURIL) 12.5 MG tablet, Take 1 tablet (12.5 mg total) by mouth daily., Disp: 90 tablet, Rfl: 3 .  levothyroxine (SYNTHROID) 50 MCG tablet, TAKE 1 TABLET(50 MCG) BY MOUTH DAILY BEFORE BREAKFAST, Disp: 30 tablet, Rfl: 3 .  losartan (COZAAR) 100 MG tablet, TAKE 1 TABLET(100 MG) BY MOUTH DAILY, Disp: 90 tablet, Rfl: 1 .  lubiprostone (AMITIZA) 24 MCG capsule, Take 24 mcg by mouth daily as needed. , Disp: , Rfl:  .  oxycodone (ROXICODONE) 30 MG immediate release tablet, Take 1 tablet by mouth 5 (five) times daily as needed., Disp: , Rfl:  .  propranolol (INDERAL) 40 MG tablet, Take 1 tablet (40 mg total) by mouth 2 (two) times daily., Disp: 120 tablet, Rfl: 1 .  vitamin B-12 (CYANOCOBALAMIN) 1000 MCG tablet, Take 1 tablet (1,000 mcg total) by mouth daily., Disp: , Rfl:  .  NARCAN 4 MG/0.1ML LIQD nasal spray kit, SPRAY 0.1 ML (4 MG) IN 1 NOSTRIL/ MAY REPEAT DOSE EVERY  2 3 MINUTES AS NEEDED ALTERNATING NOSTRILS, Disp: , Rfl:    Physical exam:  Vitals:   12/21/19 1341  BP: 131/66  Pulse: 69  Resp: 18  Temp: 98.4 F (36.9 C)  TempSrc: Oral  SpO2: 100%  Weight: 210 lb (95.3 kg)   Physical Exam Constitutional:      General: He is not in acute distress. Cardiovascular:     Rate and Rhythm: Normal rate and regular rhythm.     Heart sounds: Normal heart sounds.  Pulmonary:     Effort: Pulmonary effort is normal.     Breath sounds: Normal breath sounds.  Abdominal:     General: Bowel sounds are normal.     Palpations: Abdomen is soft.     Comments: Obese, distended.  Spleen tip is palpable.  No palpable hepatomegaly  Musculoskeletal:     Comments: Right AKA  Skin:    General: Skin is warm and dry.  Neurological:     Mental Status: He is alert and oriented to person, place, and time.        CMP Latest Ref Rng & Units 12/21/2019  Glucose 70 - 99 mg/dL 123(H)  BUN 8 - 23 mg/dL 19  Creatinine 0.61 - 1.24 mg/dL 1.06  Sodium 135 - 145 mmol/L 136  Potassium 3.5 - 5.1 mmol/L 4.1  Chloride 98 - 111 mmol/L 98  CO2 22 - 32 mmol/L 28  Calcium 8.9 - 10.3 mg/dL 9.3  Total Protein 6.5 - 8.1 g/dL 7.2  Total Bilirubin 0.3 - 1.2 mg/dL 3.3(H)  Alkaline Phos 38 - 126 U/L 62  AST 15 - 41 U/L 31  ALT 0 - 44 U/L 22   CBC Latest Ref Rng & Units 12/21/2019  WBC 4.0 - 10.5 K/uL 6.2  Hemoglobin 13.0 - 17.0 g/dL 10.3(L)  Hematocrit 39.0 - 52.0 % 30.2(L)  Platelets 150 - 400 K/uL 99(L)    No images are attached to the encounter.  CT Head Wo Contrast  Result Date: 12/21/2019 CLINICAL DATA:  Seizures. EXAM: CT HEAD WITHOUT CONTRAST TECHNIQUE: Contiguous axial images were obtained from the base of the skull through the vertex without intravenous contrast. COMPARISON:  June 27, 2019 FINDINGS: Brain: There is mild cerebral atrophy with widening of the extra-axial spaces and ventricular dilatation. There are areas of decreased attenuation within the white  matter tracts of the supratentorial brain, consistent with microvascular disease changes. Vascular: No hyperdense vessel or unexpected calcification. Skull: Normal. Negative for fracture or  focal lesion. Sinuses/Orbits: No acute finding. Other: None. IMPRESSION: No acute intracranial abnormality. Electronically Signed   By: Virgina Norfolk M.D.   On: 12/21/2019 16:46   DG Chest Portable 1 View  Result Date: 12/21/2019 CLINICAL DATA:  Status post intubation. EXAM: PORTABLE CHEST 1 VIEW COMPARISON:  None. FINDINGS: An endotracheal tube is seen. Its distal tip is to the left of midline and is approximately 1.5 cm from the carina. Decreased lung volumes are seen with mild diffusely increased interstitial lung markings. Mild atelectasis is noted within the bilateral lung bases. There is no evidence of a pleural effusion or pneumothorax. The cardiac silhouette is moderately enlarged. The visualized skeletal structures are unremarkable. IMPRESSION: 1. Endotracheal tube positioning, as described above. 2. Mild interstitial pulmonary edema. 3. Mild bibasilar atelectasis. 4. Moderate cardiomegaly. Electronically Signed   By: Virgina Norfolk M.D.   On: 12/21/2019 16:41    Assessment and plan- Patient is a 69 y.o. male with history of hep C related cirrhosis referred for pancytopenia  1.  Patient has had chronic pancytopenia dating back to 2015 and his platelet counts at baseline are in the 50s.  Suspect pancytopenia secondary to cirrhosis and hypersplenism causing sequestration.  Today I will check a comprehensive anemia work-up including CBC with differential, CMP, smear review, ferritin and iron studies, B12 and folate, haptoglobin reticulocyte count, myeloma panel, LDH and Coombs test.  I do not see an indication for bone marrow biopsy at this time.  If his cytopenias worsen over time it may be indicated  2.  Given patient's history of cirrhosis he is at increased risk of hepatocellular carcinoma and I will  check an AFPAs well as right upper quadrant ultrasound today.  I will see him back in 2 weeks time for an in person visit.  Patient was here with his son today who has come from [place].  They had multiple insightful questions which were answered to their satisfaction.   Thank you for this kind referral and the opportunity to participate in the care of this patient   Visit Diagnosis 1. Elevated bilirubin   2. Normocytic anemia   3. Thrombocytopenia ([place])   4. Cirrhosis of liver without ascites, unspecified hepatic cirrhosis type ([place])     Dr. Randa Evens, MD, MPH [facility] [phone] 12/24/2019  10:24 AM

## 2019-12-31 ENCOUNTER — Ambulatory Visit: Payer: Medicare Other

## 2020-01-01 ENCOUNTER — Other Ambulatory Visit: Payer: Self-pay

## 2020-01-01 ENCOUNTER — Ambulatory Visit
Admission: RE | Admit: 2020-01-01 | Discharge: 2020-01-01 | Disposition: A | Discharge status: Home / Self Care | Payer: Medicare Other | Source: Ambulatory Visit | Attending: Oncology | Admitting: Oncology

## 2020-01-01 DIAGNOSIS — D696 Thrombocytopenia, unspecified: Secondary | ICD-10-CM | POA: Diagnosis present

## 2020-01-01 DIAGNOSIS — K7460 Unspecified cirrhosis of liver: Secondary | ICD-10-CM

## 2020-01-04 ENCOUNTER — Other Ambulatory Visit: Payer: Self-pay

## 2020-01-04 ENCOUNTER — Encounter: Payer: Self-pay | Admitting: Oncology

## 2020-01-04 ENCOUNTER — Inpatient Hospital Stay: Payer: Medicare Other | Attending: Oncology | Admitting: Oncology

## 2020-01-04 VITALS — BP 133/47 | HR 61 | Temp 97.6°F | Resp 16 | Wt 210.0 lb

## 2020-01-04 DIAGNOSIS — D696 Thrombocytopenia, unspecified: Secondary | ICD-10-CM | POA: Insufficient documentation

## 2020-01-04 DIAGNOSIS — D591 Autoimmune hemolytic anemia, unspecified: Secondary | ICD-10-CM | POA: Insufficient documentation

## 2020-01-04 DIAGNOSIS — D594 Other nonautoimmune hemolytic anemias: Secondary | ICD-10-CM | POA: Diagnosis not present

## 2020-01-04 DIAGNOSIS — Z89512 Acquired absence of left leg below knee: Secondary | ICD-10-CM | POA: Insufficient documentation

## 2020-01-04 DIAGNOSIS — D72819 Decreased white blood cell count, unspecified: Secondary | ICD-10-CM | POA: Diagnosis not present

## 2020-01-04 DIAGNOSIS — Z801 Family history of malignant neoplasm of trachea, bronchus and lung: Secondary | ICD-10-CM | POA: Insufficient documentation

## 2020-01-04 DIAGNOSIS — R161 Splenomegaly, not elsewhere classified: Secondary | ICD-10-CM | POA: Insufficient documentation

## 2020-01-04 DIAGNOSIS — K7460 Unspecified cirrhosis of liver: Secondary | ICD-10-CM | POA: Insufficient documentation

## 2020-01-04 MED ORDER — FOLIC ACID 1 MG PO TABS
1.0000 mg | ORAL_TABLET | Freq: Every day | ORAL | 3 refills | Status: DC
Start: 2020-01-04 — End: 2020-03-15

## 2020-01-04 NOTE — Progress Notes (Signed)
Patient here for initial oncology appointment, expresses complaints of generalized pain, no other complaints.

## 2020-01-05 ENCOUNTER — Ambulatory Visit: Payer: Medicare Other | Attending: Orthopedic Surgery

## 2020-01-05 ENCOUNTER — Other Ambulatory Visit: Payer: Self-pay

## 2020-01-05 DIAGNOSIS — M6281 Muscle weakness (generalized): Secondary | ICD-10-CM

## 2020-01-05 DIAGNOSIS — R293 Abnormal posture: Secondary | ICD-10-CM

## 2020-01-05 NOTE — Therapy (Addendum)
[place] MAIN [facility] SERVICES [address] [place], [place], [address] Phone: [phone]   Fax:  [phone]  Physical Therapy Evaluation  Patient Details  Name: [name]. MRN: [id number] Date of Birth: 12-27-1950 Referring Provider (PT): [place]   Encounter Date: 01/05/2020  PT End of Session - 01/06/20 0933    Visit Number  1    Number of Visits  1    Date for PT Re-Evaluation  01/05/20    PT Start Time  9480    PT Stop Time  1620    PT Time Calculation (min)  65 min    Equipment Utilized During Treatment  Gait belt    Activity Tolerance  Patient tolerated treatment well;Patient limited by fatigue    Behavior During Therapy  WFL for tasks assessed/performed       Past Medical History:  Diagnosis Date  . 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 ANXIETY 03/24/2007  . Chronic pain   . CIRRHOSIS 03/24/2007  . 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 FRACTURE, WRIST, LEFT 10/12/2009   Qualifier: Diagnosis of  By: Loanne Drilling MD, Jacelyn Pi   . HEPATITIS C 03/24/2007  . HYPERLIPIDEMIA 09/12/2009  . Hypocalcemia 12/23/2007   Qualifier: Diagnosis of  By: Loanne Drilling MD, Jacelyn Pi   . Pancytopenia 12/23/2007  . 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 Squamous cell carcinoma of skin 05/21/2018   R mid volar forearm  . Thyroid disease   . UROLITHIASIS, HX OF 12/23/2007    Past Surgical History:  Procedure Laterality Date  . ABOVE KNEE LEG AMPUTATION Right    Right femur fracture, 11/1994, resulted in right AKA 2006 after staph infection    There were no vitals filed for this visit.   Subjective Assessment - 01/06/20 0930    Subjective  Patient presents to physical therapy wheelchair evaluation    Pertinent History  History of seizures, AKA (R 2009), anxiety, chronic pain syndrome, cirrhosis, fracture L wrist, Hep C, HLD, hypocalcemia, pancytopenia, phantom limb syndrome, squamous cell carcinoma  of skin, thyroid disease, gilbert's syndrome, anemia, cellulitis of R thigh.  Patient was intubated on last visit to ED, was extubated without difficulty however patient declined admission to hospital despite doctor's education on necessity. Patient uses a motorized wheelchair for mobility but is independent of his ADLs and IADLs and is able to drive as well. Used to hop with crutches but can't anymore due to L knee pain. Uses crutches to get into/out of car into/out of wheelchair but has severe pain and frequent falls.  Patient uses his chair for all ADL's in home.   Has spasms in R hip with phantom limb pain, as well as pain in L knee/leg; back, and shoulders.    Limitations  Standing;Walking;House hold activities;Other (comment)    How long can you sit comfortably?  n/a    How long can you stand comfortably?  n/a    How long can you walk comfortably?  n/a ; non ambulatory    Patient Stated Goals  to have a new power chair    Currently in Pain?  --   multiple pain locations including phantom limb pain, L knee pain, back pain, shoulder pain        [facility] PT Assessment - 01/06/20 0001      Assessment   Medical Diagnosis  R AKA    Referring Provider (PT)  Mack Guise  Lennette Bihari    Onset Date/Surgical Date  --   2009   Hand Dominance  Right      Precautions   Precautions  Fall      Restrictions   Weight Bearing Restrictions  No      Balance Screen   Has the patient fallen in the past 6 months  Yes    How many times?  multiple    Has the patient had a decrease in activity level because of a fear of falling?   Yes    Is the patient reluctant to leave their home because of a fear of falling?   Yes      [place]  Private residence    Living Arrangements  Alone    Available Help at Discharge  Available PRN/intermittently;Home health    Type of [place] Access  Level entry    [place]  One level    Home Equipment  Wheelchair -  power;Crutches;Shower seat;Grab bars - toilet;Grab bars - tub/shower;Adaptive equipment           PATIENT INFORMATION: This Evaluation form will serve as the LMN for the following suppliers:  Supplier: [employer] Contact Person: Mammie Lorenzo Phone: [phone]   Reason for Referral: Patient/caregiver Goals: Patient was seen for face-to-face evaluation for new power wheelchair.  Also present was   Deberah Pelton from [facility]    to discuss recommendations and wheelchair options.  Further paperwork was completed and sent to vendor.  Patient appears to qualify for power mobility device at this time per objective findings.   MEDICAL HISTORY: Diagnosis: Above Knee Amputation, wheelchair bound Primary Diagnosis Onset: 12/23/2007 _0 Progressive Disease Relevant Past and Future Surgeries: amputation (above mentioned),  Height: 6 ft Weight: 210 lb Explain and recent changes or trends in weight: weight fluctuates a little, nothing too bad.   Relevant History including falls:  History of seizures, AKA (R 2009), anxiety, chronic pain syndrome, cirrhosis, fracture L wrist, Hep C, HLD, hypocalcemia, pancytopenia, phantom limb syndrome, squamous cell carcinoma of skin, thyroid disease, gilbert's syndrome, anemia, cellulitis of R thigh.  Patient was intubated on last visit to ED, was extubated without difficulty however patient declined admission to hospital despite doctor's education on necessity. Patient uses a motorized wheelchair for mobility but is independent of his ADLs and IADLs and is able to drive as well. Used to hop with crutches but can't anymore due to L knee pain. Uses crutches to get into/out of car into/out of wheelchair but has severe pain and frequent falls.  Patient uses his chair for all ADL's in home.   Has spasms in R hip with phantom limb pain, as well as pain in L knee/leg; back, and shoulders.    HOME ENVIRONMENT: _1 House  _2 Condo/town home  _3 Apartment  _4 Assisted Living     _5 Lives Alone _6  Lives with Others                                                    Hours with caregiver:  5 days a week have an aide who helps with household chores; getting ready, meal prep, etc.   _7 Home is accessible to patient            Stairs  _8 Yes _9  No     Ramp _10 Yes [  x]No Comments:  Handicap accessible s/p amputation.  Level entry, 36" door, been there 12 years.    COMMUNITY ADL: TRANSPORTATION: _0 Car    _1 Van    <XTGGYIRSWNIOEVOJ>_5<\/KKXFGHWEXHBZJIRC>_7 Public Transportation    _3 Adapted w/c Lift   _4 Ambulance   _5 Other:       _6 Sits in wheelchair during transport  Employment/School:     Specific requirements pertaining to mobility                                                     Other:   Uses left leg to drive, no hand controls.       Looking to get a new vehicle to drive his power chair into.                               FUNCTIONAL/SENSORY PROCESSING SKILLS:  Handedness:   _7 Right     _8 Left    _9 NA  Comments:                                 Functional Processing Skills for Wheeled Mobility _10 Processing Skills are adequate for safe wheelchair operation  Areas of concern than may interfere with safe operation of wheelchair Description of problem   _11  Attention to environment     _12 Judgment     _13  Hearing  _14  Vision or visual processing    _15 Motor Planning  _16  Fluctuations in Behavior                                                   VERBAL COMMUNICATION: _17 WFL receptive _18  WFL expressive _19 Understandable  _20 Difficult to understand  _21 non-communicative _22  Uses an augmented communication device    CURRENT SEATING / MOBILITY: Current Mobility Base:   _23 None  _24 Dependent  _25 Manual  _26 Scooter  _27 Power   Type of Control:                       Manufacturer:   Engineering geologist                  Size:  standard                Age:     69 years old                      Current Condition of Mobility Base:  52+ years old, very worn with disrepair  Current Wheelchair components:   Patient did not bring chair in with him, came in transport chair                                                                                                                                Describe posture in present seating system:        Didn't come in chair, does have posterior pelvic tilt with external rotation of LLE in hospital transport chair.                                                                     SENSATION and SKIN ISSUES: Sensation _0 Intact _1 Impaired _2 Absent   Level of sensation: impaired left shin and below.                           Pressure Relief: Able to perform effective pressure relief :   _3 Yes  _4  No Method:   tricep press: unable to extend wrists to performs with punch motion                                                                         If not, Why?:    Limited mobility and strength of UE's; not able to clear bottom.   Unable to extend wrists for proper weight shift at this time.        **ADDENDUM CREATED 06/02/20 DUE TO INCORRECT BOX CLICKED ON EVALUATION DATE OF 01/05/20. INITIAL EVALUATION BUTTON CLICKED WAS YES, SHOULD BE NO AS PATIENT IS UNABLE TO EFFECTIVELY PRESSURE RELIEF DUE TO CONTRACTIONS OF HANDS/WRISTS WHICH ADDITIONALLY LIMIT HIS ABILITY TO TRANSFER AS WELL.  Janna Arch, PT, DPT                                                               Skin Issues/Skin Integrity Current Skin Issues   _5 Yes _6 No  _7 Intact _8  Red area _9  Open Area  _10 Scar Tissue _11 At risk from prolonged sitting  Where                              History of Skin Issues   _12 Yes _13   No  Where; cellulitis R leg,                                         When                                               Hx of skin flap surgeries _0 Yes _1 No  Where                  When                                                  Limited sitting tolerance  _2 Yes _3 No Hours spent sitting in wheelchair daily: all day                                                        Complaint of Pain:  Please describe:   Bilateral LE pain, back pain, wrist pain.                                                                                                           Swelling/Edema:    L knee, and left lower leg                                                                                                                                              ADL STATUS (in reference to wheelchair use):  Indep Assist Unable Indep with Equip Not assessed Comments  Dressing                                   x                   Lay in bed and get dressed  Eating                                x                                                                                              Toileting                 x                        x                           Able to do it mostly by self but occasionally needs help. Has to use a bottle next to bed at night.                                                           Bathing                                           x                            Uses a medical seat and arm rest, rides scooter up to it and transfers over with assistance                                                               Grooming/ Hygiene                                          x                                                                                    Meal Prep               x  Aide helps with meal prep                                                              IADLS                 x                                                                                                 Bowel Management: _0 Continent  _1 Incontinent  _2 Accidents Comments:                                                  Bladder Management: _3 Continent  _4 Incontinent  _5 Accidents Comments:           Doesn't always make it in time, sometimes sudden urge                                    WHEELCHAIR SKILLS: Manual w/c Propulsion: _6 UE or LE strength and endurance sufficient to participate in ADLs using manual wheelchair Arm :  _7 left _8 right  _9 Both                                   Foot:   _10 left _11 right  _12 Both  Distance:   Operate Scooter: _13  Strength, hand grip, balance and transfer appropriate for use _14 Living environment is accessible for use of scooter  Operate Power w/c:  _15  Std. Joystick   _16  Alternative Controls Indep _17  Assist _18  Dependent/ Unable _19  N/A _20  _21 Safe          _22  Functional      Distance:  At baseline uses power chair.               Bed confined without wheelchair _23  Yes _24  No   STRENGTH/RANGE OF MOTION:  Range of Motion Strength  Shoulder      Limited by ~ degrees in flexion and abduction.                                                                                                      Grossly 3/5; painful against resistance  Elbow  WFL                                        3+                                                              Wrist/Hand           Unable to extend past neutral, unable to flex more than 2 degrees.                                                               Unable to extend wrist to measure.                                                                 Hip      L hip: limited hip extension                                                                                                                   Grossly 3-/5      Knee             -40 degrees extension L knee                                                                                                        2+/5 knee extension, 3-/5 knee flexion          Ankle  slight eversion at resting position (~10 degrees)       2+/5 foot  MOBILITY/BALANCE:  _0  Patient is totally dependent for  mobility                                                                                               Balance Transfers Ambulation  Sitting Balance: Standing Balance: _1  Independent _2  Independent/Modified Independent  _3  WFL     _4  WFL _5  Supervision; uses sliding transfer and/or crutches to transfer to/from chair/vehicle, multiple falls.  _6  Supervision  _7  Uses UE for balance  _8  Supervision _9  Min Assist _10  Ambulates with Assist                           _11  Min Assist _12  Min assist _13  Mod Assist _14  Ambulates with Device:  _15  RW   _16  StW   _17  Cane   _18                 _19  Mod Assist _20  Mod assist: _21  Max assist   _22  Max Assist _23  Max assist _24  Dependent _25  Indep. Short Distance Only  _26  Unable _27  Unable _28  Lift / Sling Required Distance (in feet)                             _29  Sliding board _30  Unable to Ambulate: (Explain: single limb, multiple falls  Cardio Status:  _31 Intact  _32  Impaired   _33  NA                              Respiratory Status:  _34 Intact   _35 Impaired   _36 NA                                     Orthotics/Prosthetics:    Unable to wear prosthetic on R residual limb due to limited residual tissue.                                                                      Comments (Address manual vs power w/c vs scooter):    Patient is a pleasant 69 year old male who has a high level AKA of RLE, he is unable to utilize a prosthesis due to limited limb length of residual limb. Patient uses a power chair at baseline and occasionally utilizes crutches for squat pivot transfer into/out of chair into/out of bed or car. Patient has frequent falls with crutches now and requires use of an aide 5 days a week for iADLs His UE's are limited with limited to no wrist extension making it so he is unable to propel a manual chair. His back pain and limited trunk stability is not able to support use of a scooter.  The Quantum  Edge 2.0 will be the most applicable fit for patient mobility and safety.                                                          Anterior / Posterior Obliquity Rotation-Pelvis  PELVIS    _0 Neutral  _1  Posterior  _2  Anterior     _3 WFL  _4 Right Elevated  _5 Left Elevated   _6 WFL  _7 Right Anterior _8   Left Anterior    _9  Fixed _10  Partly Flexible _11  Flexible  _12  Other  _13  Fixed  _14  Partly Flexible  _15  Flexible _16  Other  _17  Fixed  _18  Partly Flexible  _19  Flexible _20  Other  TRUNK _21 WFL _22 Thoracic Kyphosis _23 Lumbar Lordosis   _24  WFL _25 Convex Right _26 Convex Left   _27 c-curve _28 s-curve _29 multiple  _30  Neutral _31  Left-anterior _32  Right-anterior    _33  Fixed _34  Flexible _35  Partly Flexible       Other  _36  Fixed _37  Flexible _38  Partly Flexible _39  Other  _40  Fixed           _41  Flexible _42  Partly Flexible _43  Other   Position Windswept   HIPS  _44  Neutral _45  Abduct / External rotate _46  Adduct/ _47  Neutral _48  Right _49  Left       _50  Fixed  _51  Partly Flexible             _52  Dislocated _53  Flexible _54  Subluxed    _55  Fixed _56  Partly Flexible  _57  Flexible _58  Other              Foot Positioning Knee Positioning   Knees and  Feet  _59  WFL _60 Left _61 Right _62  WFL _63 Left _64 Right   KNEES ROM concerns: ROM concerns: limited knee extension   & Dorsi-Flexed                    _65 Lt _66 Rt                                  FEET Plantar Flexed                  _67 Lt _68 Rt     Inversion                    _69 Lt _70 Rt     Eversion                    _71 Lt _72 Rt    HEAD _73  Functional _74  Good Head Control   & _75  Flexed         _76  Extended _77  Adequate Head Control   NECK _78  Rotated  Lt  _79  Lat Flexed Lt _80  Rotated  Rt _81  Lat Flexed Rt _82  Limited Head Control    _83  Cervical Hyperextension _84  Absent  Head Control    SHOULDERS ELBOWS WRIST& HAND         Left     Right    Left     Right  U/E _85 Functional  Left            _86 Functional  Right                                 _87   Fisting             _0 Fisting      _1 elevated Left _2 depressed  Left _3 elevated Right _4 depressed  Right      _5 protracted Left _6 retracted Left _7 protracted Right _8 retracted Right _9 subluxed  Left              _10 subluxed  Right         Goals for Wheelchair Mobility  _11  Independence with mobility in the home with motor related ADLs (MRADLs)  _12  Independence with MRADLs in the community _13  Provide dependent mobility  _14  Provide recline     _15 Provide tilt   Goals for Seating system _16  Optimize pressure distribution _17  Provide support needed to facilitate function or safety _18  Provide corrective forces to assist with maintaining or improving posture _19  Accommodate client's posture: current seated postures and positions are not flexible or will not tolerate corrective forces _20  Client to be independent with relieving pressure in the wheelchair _21 Enhance physiological function such as breathing, swallowing, digestion  Simulation ideas/Equipment trials:      The Quantum Edge 2.0 will be the optimal chair selection for this patient to provide transport and improve independent mobility. Backwards power tilt will allow patient to pressure relief due to limited wrist extension and limb positioning, it will also help relieve edema.  Recline is required for phantom limb pain syndrome resulting in twitches and spasms of residual R limb as well as assist in positioning in chair and pressure relief. TruComfort back and TruBalance seat cushion will provide optimal support and positioning.  A left lateral thigh guide will reduce external rotation of left limb.    Arm rest and foot plates will allow for positioning of self, transferring, and ADL function. Head rest and belt will allow for safe body mechanics and positioning.  In conclusion the Quantum Edge 2.0 is the optimal chair for this patient to improve mobility, safety, reduce health risks, and improve quality of life.                                                                                  State why other equipment was unsuccessful:        Patient uses a power chair at baseline and occasionally utilizes crutches for squat pivot transfer into/out of chair into/out of bed or car. Patient has frequent falls with crutches now and requires use of an aide 5 days a week for iADLs His UE's are limited with limited to no wrist extension making it so he is unable to propel a manual chair. His back pain and limited trunk stability is not able to support use of a scooter.  The Quantum Edge 2.0 will be the most applicable fit for patient mobility and safety.  MOBILITY BASE RECOMMENDATIONS and JUSTIFICATION: MOBILITY COMPONENT JUSTIFICATION  Manufacturer:   Quantum        Model:     Edge 2.0         Size: Width 20          Seat Depth  20           _0 provide transport from point A to B _1 promote Indep mobility  _2 is not a safe, functional ambulator _3 walker or cane inadequate _4 non-standard width/depth necessary to accommodate anatomical measurement _5                             _6 Manual Mobility Base _7 non-functional ambulator    _8 Scooter/POV  _9 can safely operate  _10 can safely transfer   _11 has adequate trunk stability  _12 cannot functionally propel manual w/c  _13 Power Mobility Base  _14 non-ambulatory  _15 cannot functionally propel manual wheelchair  _16  cannot functionally and safely operate scooter/POV _17 can safely operate and willing to  _18 Stroller Base _19 infant/child  _20 unable to propel manual wheelchair _21 allows for growth _22 non-functional ambulator _23 non-functional UE _24 Indep mobility is not a goal at this time  _25 Tilt  _26 Forward                   _27 Backward                  _28 Powered tilt              _29 Manual tilt  _30 change position against gravitational force on head and shoulders  _31 change position for pressure relief/cannot weight shift _32 transfers   _33 management of tone _34 rest periods _35 control edema _36 facilitate postural control  _37                                       _38 Recline  _39 Power recline on power base _40 Manual recline on manual base  _41 accommodate femur to back angle  _42 bring to full recline for ADL care  _43 change position for pressure relief/cannot weight shift _44 rest periods _45 repositioning for transfers or clothing/diaper /catheter changes _46 head positioning, reduction of tremors/spasms of residual limb.   _47 Lighter weight required _48 self- propulsion  _49 lifting _50                                                 _51 Heavy Duty required _52 user weight greater than 250# _53 extreme tone/ over active movement _54 broken frame on previous chair _55                                     _56  Back  _57  Angle Adjustable _58  Custom molded       TruComfort             _59 postural control _60 control of tone/spasticity _61 accommodation of range of motion _62 UE functional control _63 accommodation for seating system _64                                          _65 provide lateral trunk support _66 accommodate deformity _67 provide posterior trunk support _68 provide lumbar/sacral support _69 support trunk in midline _70 Pressure relief over spinal processes  _71  Seat Cushion TruBalance:  skin protectant and positioning                       _0 impaired sensation  _1 decubitus ulcers present _2 history of pressure ulceration _3 prevent pelvic extension _4 low maintenance  _5 stabilize pelvis  _6 accommodate obliquity _7 accommodate multiple deformity _8 neutralize lower extremity position _9 increase pressure distribution _10                                           _11  Pelvic/thigh support  _12  Lateral thigh guide (L) _13  Distal medial pad  _14  Distal lateral pad _15  pelvis in neutral _16 accommodate pelvis _17  position upper legs _18  alignment _19  accommodate ROM _20  decrease adduction _21 accommodate tone _22 removable for  transfers _23 decrease abduction  _24  Lateral trunk Supports _25  Lt     _26  Rt _27 decrease lateral trunk leaning _28 control tone _29 contour for increased contact _30 safety  _31 accommodate asymmetry _32                                                 _33  Mounting hardware  _34 lateral trunk supports  _35 back   _36 seat _37 headrest      _38  thigh support _39 fixed   _40 swing away _41 attach seat platform/cushion to w/c frame _42 attach back cushion to w/c frame _43 mount postural supports _44 mount headrest  _45 swing medial thigh support away _46 swing lateral supports away for transfers  _47                                                     Armrests  _48 fixed _49 adjustable height _50 removable   _51 swing away  _52 flip back   _53 reclining _54 full length pads _55 desk    _56 pads tubular  _57 provide support with elbow at 90   _58 provide support for w/c tray _59 change of height/angles for variable activities _60 remove for transfers _61 allow to come closer to table top _62 remove for access to tables _63                                               Hangers/ Leg rests  _64 60 _65 70 _66 90 _67 elevating _68 heavy duty  _69 articulating _70 fixed _71 lift off _72 swing away     _73 power _74 provide LE support  _75 accommodate to hamstring tightness _76 elevate legs during recline   _77 provide change in position for Legs _78 Maintain placement of feet on footplate _79 durability _80 enable transfers _81 decrease edema _82 Accommodate lower leg length _83                                         Foot support Footplate    <YNWGNFAOZHYQMVHQ>_4<\/ONGEXBMWUXLKGMWN>_02 Lt  _85  Rt  _86  Center mount _87 flip up                            _88 depth/angle adjustable _89 Amputee adapter    _90  Lt     _91  Rt _92 provide foot support _93 accommodate to ankle ROM _94 transfers _95 Provide support for residual extremity _96   allow foot to go under wheelchair base _0  decrease tone  _1                                                 _2  Ankle strap/heel loops _3 support foot on foot support _4 decrease extraneous  movement _5 provide input to heel  _6 protect foot  Tires: _7 pneumatic  _8 flat free inserts  _9 solid  _10 decrease maintenance  _11 prevent frequent flats _12 increase shock absorbency _13 decrease pain from road shock _14 decrease spasms from road shock _15                                              _16  Headrest ; 10 inch  _17 provide posterior head support _18 provide posterior neck support _19 provide lateral head support _20 provide anterior head support _21 support during tilt and recline _22 improve feeding   _23 improve respiration _24 placement of switches _25 safety  _26 accommodate ROM  _27 accommodate tone _28 improve visual orientation  _29  Anterior chest strap _30  Vest _31  Shoulder retractors  _32 decrease forward movement of shoulder _33 accommodation of TLSO _34 decrease forward movement of trunk _35 decrease shoulder elevation _36 added abdominal support _37 alignment _38 assistance with shoulder control  _39                                               Pelvic Positioner _40 Belt _41 SubASIS bar _42 Dual Pull _43 stabilize tone _44 decrease falling out of chair/ **will not Decrease potential for sliding due to pelvic tilting _45 prevent excessive rotation _46 pad for protection over boney prominence _47 prominence comfort _48 special pull angle to control rotation _49                                                  Upper ExtremitySupport  _50 L   _51  R _52 Arm trough   _53 hand support _54  tray       _55 full tray _56 swivel mount _57 decrease edema      _58 decrease subluxation   _59 control tone   _60 placement for AAC/Computer/EADL _61 decrease gravitational pull on shoulders _62 provide midline positioning _63 provide support to increase UE function _64 provide hand support in natural position _65 provide work surface   POWER WHEELCHAIR CONTROLS  _66 Proportional  _67 Non-Proportional Type                                      _68 Left  _69 Right _70 provides access for controlling wheelchair   _71 lacks motor control to operate proportional  drive control <QMGQQPYPPJKDTOIZ>_1<\/IWPYKDXIPJASNKNL>_97 unable to understand proportional controls  Actuator Control Module  _73 Single  _74 Multiple   _75 Allow the client to operate the power seat function(s) through the joystick control   _76 Safety Reset Switches _77 Used to change modes and stop the wheelchair when driving in latch mode    _78 [employer]   _79 programming for accurate control _80 progressive Disease/changing condition _81 non-proportional drive control needed _82 Needed in order to operate power seat functions through joystick control   _83 Display box _84 Allows user to see in which mode and drive the wheelchair is set  _85 necessary for alternate  controls    _0 Digital interface electronics _1 Allows w/c to operate when using alternative drive controls  <EYCXKGYJEHUDJSHF>_0<\/YOVZCHYIFOYDXAJO>_8 ASL Head Array _3 Allows client to operate wheelchair  through switches placed in tri-panel headrest  _4 Sip and puff with tubing kit _5 needed to operate sip and puff drive controls  <NOMVEHMCNOBSJGGE>_3<\/MOQHUTMLYYTKPTWS>_5 Upgraded tracking electronics _7 increase safety when driving <KCLEXNTZGYFVCBSW>_9<\/QPRFFMBWGYKZLDJT>_7 correct tracking when on uneven surfaces  _9 [facility] for switches or joystick _10 Attaches switches to w/c  _11 Swing away for access or transfers _12 midline for optimal placement _13 provides for consistent access  _14 Attendant controlled joystick plus mount _15 safety _16 long distance driving <SVXBLTJQZESPQZRA>_0<\/TMAUQJFHLKTGYBWL>_89 operation of seat functions _18 compliance with transportation regulations _19                                             Rear wheel placement/Axle adjustability _20 None _21 semi adjustable _22 fully adjustable  _23 improved UE access to wheels _24 improved stability _25 changing angle in space for improvement of postural stability _26 1-arm drive access <HTDSKAJGOTLXBWIO>_0<\/BTDHRCBULAGTXMIW>_80 amputee pad placement _28                                Wheel rims/ hand rims  _29 metal   _30 plastic coated _31 oblique projections           _32 vertical projections _33 Provide ability to propel manual wheelchair  _34  Increase self-propulsion with hand weakness/decreased grasp  Push handles _35 extended    _36 angle adjustable              _37 standard _38 caregiver access _39 caregiver assist _40 allows "hooking" to enable increased ability to perform ADLs or maintain balance  One armed device   _41 Lt   _42 Rt _43 enable propulsion of manual wheelchair with one arm   _44                                            Brake/wheel lock extension _45  Lt   _46  Rt _47 increase indep in applying wheel locks   _48 Side guards _49 prevent clothing getting caught in wheel or becoming soiled _50  prevent skin tears/abrasions  Battery:  Group 22x2                                           _51 to power wheelchair                                                         Other:  The above equipment has a life- long use expectancy. Growth and changes in medical and/or functional conditions would be the exceptions. This is to certify that the therapist has no financial relationship with durable medical provider or manufacturer. The therapist will not receive remuneration of any kind for the equipment recommended in this evaluation.   Patient has mobility limitation that significantly impairs safe, timely participation in one or more mobility related ADL's. (bathing, toileting, feeding, dressing, grooming, moving from room to room)  _0  Yes _1  No  Will mobility device sufficiently improve ability to participate and/or be aided in participation of MRADL's?      _2  Yes _3  No  Can limitation be compensated for with use of a cane or walker?                                    _4  Yes _5  No  Does patient or caregiver demonstrate ability/potential ability & willingness to safely use the mobility device?    _6  Yes _7  No  Does patient's home environment support use of recommended mobility device?            _8  Yes _9  No  Does patient have sufficient upper extremity function necessary to functionally propel a manual wheelchair?      _10  Yes _11  No  Does patient have sufficient strength and trunk stability to safely operate a POV (scooter)?                                  _12  Yes _13  No  Does patient need additional features/benefits provided by a power wheelchair for MRADL's in the home?        _14  Yes _15  No  Does the patient demonstrate the ability to safely use a power wheelchair?                   _16  Yes _17  No     Physician's Name Printed:                                                        Physician's Signature:  Date:     This is to certify that I, the above signed therapist have the following affiliations: _18  This DME provider _19  Manufacturer of recommended equipment _20  Patient's long term care facility _21  None of the above  Therapist Name/Signature:        Janna Arch, PT, DPT                                      Date: 01/05/20          Objective measurements completed on examination: See above findings.              PT Education - 01/06/20 0933    Education Details  wheelchair eval    Person(s) Educated  Patient    Methods  Explanation    Comprehension  Verbalized understanding       PT Short Term Goals - 01/06/20 0936      PT SHORT TERM GOAL #1   Title  Pt  and caregivers will understand PT recommendation and appropriate/safe use for wheelchair and seating for home use.    Baseline  met    Time  1    Period  Days    Status  Achieved    Target Date  01/05/20                Plan - 01/06/20 0934    Clinical Impression Statement  Patient is a pleasant 69 year old male who has a high level AKA of RLE, he is unable to utilize a prosthesis due to limited limb length of residual limb. Patient uses a power chair at baseline and occasionally utilizes crutches for squat pivot transfer into/out of chair into/out of bed or car. Patient has frequent falls with crutches now and requires use of an aide 5 days a week for iADLs His UE's are limited with limited to no wrist  extension making it so he is unable to propel a manual chair. His back pain and limited trunk stability is not able to support use of a scooter.  The Quantum Edge 2.0 will be the most applicable fit for patient mobility and safety.    Personal Factors and Comorbidities  Age;Comorbidity 3+;Fitness;Past/Current Experience;Time since onset of injury/illness/exacerbation;Transportation    Comorbidities  seizures, AKA (R 2009), anxiety, chronic pain syndrome, cirrhosis, fracture L wrist, Hep C, HLD, hypocalcemia, pancytopenia, phantom limb syndrome, squamous cell carcinoma of skin, thyroid disease, gilbert's syndrome, anemia, cellulitis of R thigh    Examination-Activity Limitations  Bathing;Bed Mobility;Carry;Caring for Others;Bend;Dressing;Hygiene/Grooming;Squat;Sit;Locomotion Level;Lift;Stand;Toileting;Transfers    Examination-Participation Restrictions  Church;Cleaning;Community Activity;Interpersonal Relationship;Driving;Volunteer;Laundry;Shop;Meal Prep;Yard Work    [employer]  Evolving/Moderate complexity    Clinical Decision Making  Moderate    PT Frequency  One time visit    Consulted and Agree with Plan of Care  Patient       Patient will benefit from skilled therapeutic intervention in order to improve the following deficits and impairments:  Decreased activity tolerance, Decreased endurance, Decreased mobility, Decreased strength, Postural dysfunction, Improper body mechanics, Pain, Obesity, Decreased range of motion, Increased edema, Impaired flexibility  Visit Diagnosis: Muscle weakness (generalized)  Abnormal posture     Problem List Patient Active Problem List   Diagnosis Date Noted  . HTN (hypertension) 09/14/2019  . Hepatic encephalopathy ([place]) 04/07/2018  . Chronic pain syndrome 11/23/2016  . Hypothyroidism 09/25/2016  . 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 Cellulitis of right thigh  05/21/2012  . Pancytopenia ([place]) 02/26/2012  . Encounter for long-term (current) use of other medications 02/26/2012  . Screening for prostate cancer 02/26/2012  . Anemia 02/26/2012  . Closed fracture of carpal bone 10/12/2009  . Dyslipidemia 09/12/2009  . Phantom limb syndrome ([place]) 09/12/2009  . HYPOCALCEMIA 12/23/2007  . 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 UROLITHIASIS, HX OF 12/23/2007  . Anxiety 03/24/2007  . Hepatic cirrhosis ([place]) 03/24/2007   Janna Arch, PT, DPT   01/06/2020, 9:37 AM  **ADDENDUM CREATED 06/02/20 DUE TO INCORRECT BOX CLICKED ON EVALUATION DATE OF 01/05/20. INITIAL EVALUATION BUTTON CLICKED WAS YES, SHOULD BE NO AS PATIENT IS UNABLE TO EFFECTIVELY PRESSURE RELIEF DUE TO CONTRACTIONS OF HANDS/WRISTS WHICH ADDITIONALLY LIMIT HIS ABILITY TO TRANSFER AS WELL.  Janna Arch, PT, [place] [facility] [address] [place], [place], [address] Phone: [phone]   Fax:  [phone]  Name: Nihaal Friesen Chancellor [name]. MRN: [id number] Date of Birth: 1951/03/09

## 2020-01-06 NOTE — Progress Notes (Signed)
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  He has seen Dr. Allen Norris for cirrhosis in the past.  He has not had any recent episodes of ascites.  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  With regards to his CBC patient has had baseline thrombocytopenia and his platelet counts mainly fluctuate between 40-60 at least since 2015.  He also has chronic leukopenia with a white count that fluctuates between 2.3-3.5 at least dating back to 2015.  Differential has mainly showed lymphopenia but at times neutropenia.  Hemoglobin has been mostly between 9.5-10.5 most recently patient was found to have a white count of 3.5, H&H of 9.7/28.8 and a platelet count of 65 and has been referred to [place] for pancytopenia.  In terms of his CMP he has had elevated bilirubin which fluctuates between 1.6-2.9 in the past but was recently noted to have a bilirubin of 3.1 mostly indirect hyperbilirubinemia with normal AST and ALT  Patient uses a motorized wheelchair for ambulation but is  independent of his ADLs and IADLs and is able to drive as well.  Appetite and weight have been stable.  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  Currently patient denies any bleeding in his stool or urine.  Denies any dark tarry stools  Results of blood work from 12/21/2019 were as follows: CBC showed white count of 6.2, H&H of 10.3/30.2 with an MCV of 96 and a platelet count of 99.  Total bilirubin was elevated at 3.1 with predominantly indirect hyperbilirubinemia.  Folic acid was normal at 6.6.  B12 levels were elevated at 1441.  Ferritin levels were elevated at 540 iron studies were normal.  Haptoglobin was less than 10.  Reticulocyte count was elevated at 10.6.  Myeloma panel showed no M protein.  LDH was mildly elevated at 256.  Coombs test was negative.  AFP was normal at 4.3.   Interval history- no acute issues since last visit.   ECOG PS- 2 Pain scale- 0   Review of systems- Review of Systems  Constitutional: Positive for malaise/fatigue. Negative for chills, fever and weight loss.  HENT: Negative for congestion, ear discharge and nosebleeds.   Eyes: Negative for blurred vision.  Respiratory: Negative for cough, hemoptysis, sputum production, shortness of breath and wheezing.   Cardiovascular: Negative for chest pain, palpitations, orthopnea and claudication.  Gastrointestinal: Negative for abdominal pain, blood in stool, constipation, diarrhea, heartburn, melena, nausea and vomiting.  Genitourinary: Negative for dysuria, flank pain, frequency, hematuria and urgency.  Musculoskeletal: Negative for back pain, joint pain and myalgias.  Skin: Negative for rash.  Neurological: Negative for dizziness, tingling, focal  weakness, seizures, weakness and headaches.  Endo/Heme/Allergies: Does not bruise/bleed easily.  Psychiatric/Behavioral: Negative for depression and suicidal ideas. The patient does  not have insomnia.        Allergies  Allergen Reactions  . Clindamycin Dermatitis     Past Medical History:  Diagnosis Date  . 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 ANXIETY 03/24/2007  . Chronic pain   . CIRRHOSIS 03/24/2007  . 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 FRACTURE, WRIST, LEFT 10/12/2009   Qualifier: Diagnosis of  By: Loanne Drilling MD, Jacelyn Pi   . HEPATITIS C 03/24/2007  . HYPERLIPIDEMIA 09/12/2009  . Hypocalcemia 12/23/2007   Qualifier: Diagnosis of  By: Loanne Drilling MD, Jacelyn Pi   . Pancytopenia 12/23/2007  . 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 Squamous cell carcinoma of skin 05/21/2018   R mid volar forearm  . Thyroid disease   . UROLITHIASIS, HX OF 12/23/2007     Past Surgical History:  Procedure Laterality Date  . ABOVE KNEE LEG AMPUTATION Right    Right femur fracture, 11/1994, resulted in right AKA 2006 after staph infection    Social History   Socioeconomic History  . Marital status: Widowed    Spouse name: Not on file  . Number of children: Not on file  . Years of education: Not on file  . Highest education level: Not on file  Occupational History  . Occupation: Disabled  Tobacco Use  . Smoking status: Never Smoker  . Smokeless tobacco: Never Used  Substance and Sexual Activity  . Alcohol use: Not Currently    Alcohol/week: 0.0 standard drinks  . Drug use: No  . Sexual activity: Not on file  Other Topics Concern  . Not on file  Social History Narrative   Widowed 1997   Lives alone   Lost a son in his arms in 5.    3 sons   Social Determinants of Health   Financial Resource Strain:   . Difficulty of Paying Living Expenses:   Food Insecurity:   . Worried About Charity fundraiser in the Last Year:   . Arboriculturist in the Last Year:   Transportation Needs:   . Film/video editor (Medical):   Marland Kitchen Lack of Transportation (Non-Medical):   Physical Activity:   . Days of Exercise per Week:   .  Minutes of Exercise per Session:   Stress:   . Feeling of Stress :   Social Connections:   . Frequency of Communication with Friends and Family:   . Frequency of Social Gatherings with Friends and Family:   . Attends Religious Services:   . Active Member of Clubs or Organizations:   . Attends Archivist Meetings:   Marland Kitchen Marital Status:   Intimate Partner Violence:   . Fear of Current or Ex-Partner:   . Emotionally Abused:   Marland Kitchen Physically Abused:   . Sexually Abused:     Family History  Problem Relation Age of Onset  . Heart disease Mother   . Lung cancer Mother 41  . Heart attack Paternal Grandmother   . Heart attack Paternal Grandfather   . Liver cancer Neg Hx      Current Outpatient Medications:  .  ALPRAZolam (XANAX) 1 MG tablet, Take 1 mg by mouth 3 (three) times daily.  , Disp: , Rfl:  .  Cholecalciferol (VITAMIN D3) 10 MCG (400 UNIT) CAPS, Take 4 capsules by  mouth daily., Disp: , Rfl:  .  hydrochlorothiazide (HYDRODIURIL) 12.5 MG tablet, Take 1 tablet (12.5 mg total) by mouth daily., Disp: 90 tablet, Rfl: 3 .  levothyroxine (SYNTHROID) 50 MCG tablet, TAKE 1 TABLET(50 MCG) BY MOUTH DAILY BEFORE BREAKFAST, Disp: 30 tablet, Rfl: 3 .  losartan (COZAAR) 100 MG tablet, TAKE 1 TABLET(100 MG) BY MOUTH DAILY, Disp: 90 tablet, Rfl: 1 .  lubiprostone (AMITIZA) 24 MCG capsule, Take 24 mcg by mouth daily as needed. , Disp: , Rfl:  .  oxycodone (ROXICODONE) 30 MG immediate release tablet, Take 1 tablet by mouth 5 (five) times daily as needed., Disp: , Rfl:  .  propranolol (INDERAL) 40 MG tablet, Take 1 tablet (40 mg total) by mouth 2 (two) times daily., Disp: 120 tablet, Rfl: 1 .  vitamin B-12 (CYANOCOBALAMIN) 1000 MCG tablet, Take 1 tablet (1,000 mcg total) by mouth daily., Disp: , Rfl:  .  folic acid (FOLVITE) 1 MG tablet, Take 1 tablet (1 mg total) by mouth daily., Disp: 30 tablet, Rfl: 3 .  NARCAN 4 MG/0.1ML LIQD nasal spray kit, SPRAY 0.1 ML (4 MG) IN 1 NOSTRIL/ MAY REPEAT  DOSE EVERY 2 3 MINUTES AS NEEDED ALTERNATING NOSTRILS, Disp: , Rfl:   Physical exam:  Vitals:   01/04/20 1426  BP: (!) 133/47  Pulse: 61  Resp: 16  Temp: 97.6 F (36.4 C)  TempSrc: Tympanic  SpO2: 97%  Weight: 210 lb (95.3 kg)   Physical Exam Cardiovascular:     Rate and Rhythm: Normal rate and regular rhythm.     Heart sounds: Normal heart sounds.  Pulmonary:     Effort: Pulmonary effort is normal.     Breath sounds: Normal breath sounds.  Abdominal:     General: Bowel sounds are normal.     Palpations: Abdomen is soft.  Musculoskeletal:     Comments: Right AKA  Skin:    General: Skin is warm and dry.  Neurological:     Mental Status: He is alert and oriented to person, place, and time.      CMP Latest Ref Rng & Units 12/21/2019  Glucose 70 - 99 mg/dL 123(H)  BUN 8 - 23 mg/dL 19  Creatinine 0.61 - 1.24 mg/dL 1.06  Sodium 135 - 145 mmol/L 136  Potassium 3.5 - 5.1 mmol/L 4.1  Chloride 98 - 111 mmol/L 98  CO2 22 - 32 mmol/L 28  Calcium 8.9 - 10.3 mg/dL 9.3  Total Protein 6.5 - 8.1 g/dL 7.2  Total Bilirubin 0.3 - 1.2 mg/dL 3.3(H)  Alkaline Phos 38 - 126 U/L 62  AST 15 - 41 U/L 31  ALT 0 - 44 U/L 22   CBC Latest Ref Rng & Units 12/21/2019  WBC 4.0 - 10.5 K/uL 6.2  Hemoglobin 13.0 - 17.0 g/dL 10.3(L)  Hematocrit 39.0 - 52.0 % 30.2(L)  Platelets 150 - 400 K/uL 99(L)    No images are attached to the encounter.  CT Head Wo Contrast  Result Date: 12/21/2019 CLINICAL DATA:  Seizures. EXAM: CT HEAD WITHOUT CONTRAST TECHNIQUE: Contiguous axial images were obtained from the base of the skull through the vertex without intravenous contrast. COMPARISON:  June 27, 2019 FINDINGS: Brain: There is mild cerebral atrophy with widening of the extra-axial spaces and ventricular dilatation. There are areas of decreased attenuation within the white matter tracts of the supratentorial brain, consistent with microvascular disease changes. Vascular: No hyperdense vessel or unexpected  calcification. Skull: Normal. Negative for fracture or focal lesion. Sinuses/Orbits: No acute  finding. Other: None. IMPRESSION: No acute intracranial abnormality. Electronically Signed   By: Virgina Norfolk M.D.   On: 12/21/2019 16:46   US Abdomen Complete  Result Date: 01/02/2020 CLINICAL DATA:  Cirrhosis, assess for hepatocellular carcinoma, splenomegaly EXAM: ABDOMEN ULTRASOUND COMPLETE COMPARISON:  03/21/2005 FINDINGS: Gallbladder: Multiple shadowing calculi within gallbladder up to 8 mm diameter. Upper normal gallbladder wall thickness. Small amount of gallbladder sludge. No pericholecystic fluid or sonographic Murphy sign. Common bile duct: Diameter: 5 mm, normal Liver: Heterogeneous slightly increased echogenicity with minimally nodular margins consistent with cirrhosis. No focal hepatic mass or nodularity. Portal vein is patent on color Doppler imaging with normal direction of blood flow towards the liver. IVC: Normal appearance Pancreas: Visualized portion of head and body normal appearance remainder obscured by bowel gas Spleen: 14.2 cm length with a calculated volume of 1104 mL. No focal mass. Right Kidney: Length: 12.3 cm. Normal morphology without mass or hydronephrosis. Left Kidney: Length: 13.3 cm. Normal morphology without mass or hydronephrosis. Abdominal aorta: Normal caliber.  Mild atherosclerotic changes. Other findings: No free fluid. IMPRESSION: Cirrhotic appearing liver with splenomegaly. Gallstones and sludge within gallbladder without evidence of acute cholecystitis. Electronically Signed   By: Lavonia Dana M.D.   On: 01/02/2020 20:57   DG Chest Portable 1 View  Result Date: 12/21/2019 CLINICAL DATA:  Status post intubation. EXAM: PORTABLE CHEST 1 VIEW COMPARISON:  None. FINDINGS: An endotracheal tube is seen. Its distal tip is to the left of midline and is approximately 1.5 cm from the carina. Decreased lung volumes are seen with mild diffusely increased interstitial lung markings.  Mild atelectasis is noted within the bilateral lung bases. There is no evidence of a pleural effusion or pneumothorax. The cardiac silhouette is moderately enlarged. The visualized skeletal structures are unremarkable. IMPRESSION: 1. Endotracheal tube positioning, as described above. 2. Mild interstitial pulmonary edema. 3. Mild bibasilar atelectasis. 4. Moderate cardiomegaly. Electronically Signed   By: Virgina Norfolk M.D.   On: 12/21/2019 16:41     Assessment and plan- Patient is a 69 y.o. male with history of hep C cirrhosis referred for pancytopenia  1.  Thrombocytopenia:Likely secondary to cirrhosis and splenomegaly.  His platelet counts have been between 40s to 60s.  Most recent count was 99.  B12 and folate are normal.  Smear review was unremarkable.  Continue to monitor  Leukopenia: White count fluctuates anywhere between 2-6.  Presently white count is normal.  Differential in the past has mainly showed neutropenia and lymphopenia which I suspect is secondary to his hypersplenism.  Anemia: Results of anemia work-up showed elevated ferritin and B12 consistent with chronic disease.  Folate levels were normal.  Coombs test was negative but he does have an elevated reticulocyte count and low haptoglobin which may indicate ongoing hemolysis.  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  This does not require any steroids or Rituxan.  I will put him on folic acid 1 mg daily and continue to monitor his anemia conservatively which is essentially remained stable around 10 over the last 2 years.  Also his bilirubin fluctuates between 2-3 and is presently 3.3.  Will hold off on bone marrow biopsy at this time   Repeat CBC with differential, CMP, haptoglobin and reticulocyte count in 1 month's time and see me Visit Diagnosis 1. Other non-autoimmune hemolytic anemias ([place])      Dr. Randa Evens, MD,  [facility]  [facility] [phone] 01/06/2020 10:44 AM

## 2020-01-30 ENCOUNTER — Other Ambulatory Visit: Payer: Self-pay | Admitting: Endocrinology

## 2020-01-30 DIAGNOSIS — E039 Hypothyroidism, unspecified: Secondary | ICD-10-CM

## 2020-02-09 ENCOUNTER — Inpatient Hospital Stay: Payer: Medicare Other | Attending: Oncology

## 2020-02-09 ENCOUNTER — Other Ambulatory Visit: Payer: Self-pay

## 2020-02-09 DIAGNOSIS — Z881 Allergy status to other antibiotic agents status: Secondary | ICD-10-CM | POA: Diagnosis not present

## 2020-02-09 DIAGNOSIS — Z79899 Other long term (current) drug therapy: Secondary | ICD-10-CM | POA: Diagnosis not present

## 2020-02-09 DIAGNOSIS — E079 Disorder of thyroid, unspecified: Secondary | ICD-10-CM | POA: Diagnosis not present

## 2020-02-09 DIAGNOSIS — Z85828 Personal history of other malignant neoplasm of skin: Secondary | ICD-10-CM | POA: Insufficient documentation

## 2020-02-09 DIAGNOSIS — Z801 Family history of malignant neoplasm of trachea, bronchus and lung: Secondary | ICD-10-CM | POA: Diagnosis not present

## 2020-02-09 DIAGNOSIS — M7989 Other specified soft tissue disorders: Secondary | ICD-10-CM | POA: Insufficient documentation

## 2020-02-09 DIAGNOSIS — B1920 Unspecified viral hepatitis C without hepatic coma: Secondary | ICD-10-CM | POA: Insufficient documentation

## 2020-02-09 DIAGNOSIS — R161 Splenomegaly, not elsewhere classified: Secondary | ICD-10-CM | POA: Insufficient documentation

## 2020-02-09 DIAGNOSIS — Z8249 Family history of ischemic heart disease and other diseases of the circulatory system: Secondary | ICD-10-CM | POA: Insufficient documentation

## 2020-02-09 DIAGNOSIS — D589 Hereditary hemolytic anemia, unspecified: Secondary | ICD-10-CM | POA: Insufficient documentation

## 2020-02-09 DIAGNOSIS — K7460 Unspecified cirrhosis of liver: Secondary | ICD-10-CM | POA: Insufficient documentation

## 2020-02-09 DIAGNOSIS — Z89611 Acquired absence of right leg above knee: Secondary | ICD-10-CM | POA: Diagnosis not present

## 2020-02-09 DIAGNOSIS — Z87442 Personal history of urinary calculi: Secondary | ICD-10-CM | POA: Insufficient documentation

## 2020-02-09 DIAGNOSIS — E785 Hyperlipidemia, unspecified: Secondary | ICD-10-CM | POA: Diagnosis not present

## 2020-02-09 DIAGNOSIS — I10 Essential (primary) hypertension: Secondary | ICD-10-CM | POA: Diagnosis not present

## 2020-02-09 DIAGNOSIS — R5383 Other fatigue: Secondary | ICD-10-CM | POA: Diagnosis not present

## 2020-02-09 DIAGNOSIS — D594 Other nonautoimmune hemolytic anemias: Secondary | ICD-10-CM

## 2020-02-09 LAB — COMPREHENSIVE METABOLIC PANEL
ALT: 17 U/L (ref 0–44)
AST: 20 U/L (ref 15–41)
Albumin: 4.1 g/dL (ref 3.5–5.0)
Alkaline Phosphatase: 57 U/L (ref 38–126)
Anion gap: 8 (ref 5–15)
BUN: 13 mg/dL (ref 8–23)
CO2: 27 mmol/L (ref 22–32)
Calcium: 8.8 mg/dL — ABNORMAL LOW (ref 8.9–10.3)
Chloride: 102 mmol/L (ref 98–111)
Creatinine, Ser: 0.72 mg/dL (ref 0.61–1.24)
GFR calc Af Amer: >60 mL/min (ref >60)
GFR calc non Af Amer: >60 mL/min (ref >60)
Glucose, Bld: 113 mg/dL — ABNORMAL HIGH (ref 70–99)
Potassium: 3.9 mmol/L (ref 3.5–5.1)
Sodium: 137 mmol/L (ref 135–145)
Total Bilirubin: 1.9 mg/dL — ABNORMAL HIGH (ref 0.3–1.2)
Total Protein: 7.4 g/dL (ref 6.5–8.1)

## 2020-02-09 LAB — RETICULOCYTES
Immature Retic Fract: 26.3 % — ABNORMAL HIGH (ref 2.3–15.9)
RBC.: 2.6 MIL/uL — ABNORMAL LOW (ref 4.22–5.81)
Retic Count, Absolute: 303 10*3/uL — ABNORMAL HIGH (ref 19.0–186.0)
Retic Ct Pct: 11.7 % — ABNORMAL HIGH (ref 0.4–3.1)

## 2020-02-09 LAB — CBC WITH DIFFERENTIAL/PLATELET
Abs Immature Granulocytes: 0.04 10*3/uL (ref 0.00–0.07)
Basophils Absolute: 0 10*3/uL (ref 0.0–0.1)
Basophils Relative: 1 %
Eosinophils Absolute: 0.1 10*3/uL (ref 0.0–0.5)
Eosinophils Relative: 4 %
HCT: 25.6 % — ABNORMAL LOW (ref 39.0–52.0)
Hemoglobin: 8.7 g/dL — ABNORMAL LOW (ref 13.0–17.0)
Immature Granulocytes: 1 %
Lymphocytes Relative: 24 %
Lymphs Abs: 0.7 10*3/uL (ref 0.7–4.0)
MCH: 33.1 pg (ref 26.0–34.0)
MCHC: 34 g/dL (ref 30.0–36.0)
MCV: 97.3 fL (ref 80.0–100.0)
Monocytes Absolute: 0.2 10*3/uL (ref 0.1–1.0)
Monocytes Relative: 9 %
Neutro Abs: 1.7 10*3/uL (ref 1.7–7.7)
Neutrophils Relative %: 61 %
Platelets: 42 10*3/uL — ABNORMAL LOW (ref 150–400)
RBC: 2.63 MIL/uL — ABNORMAL LOW (ref 4.22–5.81)
RDW: 20.1 % — ABNORMAL HIGH (ref 11.5–15.5)
WBC: 2.8 10*3/uL — ABNORMAL LOW (ref 4.0–10.5)
nRBC: 0 % (ref 0.0–0.2)

## 2020-02-09 LAB — PATHOLOGIST SMEAR REVIEW

## 2020-02-09 LAB — LACTATE DEHYDROGENASE: LDH: 178 U/L (ref 98–192)

## 2020-02-10 LAB — HAPTOGLOBIN: Haptoglobin: <10 mg/dL — ABNORMAL LOW (ref 32–363)

## 2020-02-11 ENCOUNTER — Other Ambulatory Visit: Payer: Self-pay

## 2020-02-11 ENCOUNTER — Inpatient Hospital Stay (HOSPITAL_BASED_OUTPATIENT_CLINIC_OR_DEPARTMENT_OTHER): Payer: Medicare Other | Admitting: Oncology

## 2020-02-11 VITALS — BP 98/50 | HR 70 | Temp 99.6°F | Resp 16 | Wt 210.0 lb

## 2020-02-11 DIAGNOSIS — D594 Other nonautoimmune hemolytic anemias: Secondary | ICD-10-CM | POA: Diagnosis not present

## 2020-02-11 DIAGNOSIS — D589 Hereditary hemolytic anemia, unspecified: Secondary | ICD-10-CM | POA: Diagnosis not present

## 2020-02-12 LAB — PNH PROFILE (-HIGH SENSITIVITY)

## 2020-02-13 ENCOUNTER — Encounter: Payer: Self-pay | Admitting: Oncology

## 2020-02-13 NOTE — Progress Notes (Signed)
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 He has seen Dr. Allen Norris for cirrhosis in the past. He has not had any recent episodes of ascites. 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 With regards to his CBC patient has had baseline thrombocytopenia and his platelet counts mainly fluctuate between 40-60 at least since 2015. He also has chronic leukopenia with a white count that fluctuates between 2.3-3.5 at least dating back to 2015. Differential has mainly showed lymphopenia but at times neutropenia. Hemoglobin has been mostly between 9.5-10.5 most recently patient was found to have a white count of 3.5, H&H of 9.7/28.8 and a platelet count of 65 and has been referred to [place] for pancytopenia. In terms of his CMP he has had elevated bilirubin which fluctuates between 1.6-2.9 in the past but was recently noted to have a bilirubin of 3.1 mostly indirect hyperbilirubinemia with normal AST and ALT  Patient uses a motorized wheelchair for ambulation but is independent of his ADLs and IADLs  and isable to drive as well. Appetite and weight have been stable. 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 Currently patient denies any bleeding in his stool or urine. Denies any dark tarry stools  Results of blood work from 12/21/2019 were as follows: CBC showed white count of 6.2, H&H of 10.3/30.2 with an MCV of 96 and a platelet count of 99.  Total bilirubin was elevated at 3.1 with predominantly indirect hyperbilirubinemia.  Folic acid was normal at 6.6.  B12 levels were elevated at 1441.  Ferritin levels were elevated at 540 iron studies were normal.  Haptoglobin was less than 10.  Reticulocyte count was elevated at 10.6.  Myeloma panel showed no M protein.  LDH was mildly elevated at 256.  Coombs test was negative.  AFP was normal at 4.3.   Interval history-patient has been feeling at his baseline.  He has fatigue and bilateral leg swelling which is unchanged.  Appetite and weight have essentially remained stable. His blood pressure has been running low today  ECOG PS- 2 Pain scale- 0  Review of systems- Review of Systems  Constitutional: Positive for malaise/fatigue. Negative for chills, fever and weight loss.  HENT: Negative for congestion, ear discharge and nosebleeds.   Eyes: Negative for blurred vision.  Respiratory: Negative for cough, hemoptysis, sputum production, shortness of breath and wheezing.   Cardiovascular: Positive for leg swelling. Negative for chest pain, palpitations, orthopnea and claudication.  Gastrointestinal: Negative for abdominal pain, blood in stool, constipation, diarrhea, heartburn, melena, nausea and vomiting.  Genitourinary: Negative for dysuria, flank pain, frequency, hematuria and urgency.  Musculoskeletal: Negative for back pain, joint pain and myalgias.  Skin:  Negative for rash.  Neurological: Negative for dizziness, tingling, focal weakness, seizures,  weakness and headaches.  Endo/Heme/Allergies: Does not bruise/bleed easily.  Psychiatric/Behavioral: Negative for depression and suicidal ideas. The patient does not have insomnia.       Allergies  Allergen Reactions  . Clindamycin Dermatitis     Past Medical History:  Diagnosis Date  . 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 ANXIETY 03/24/2007  . Chronic pain   . CIRRHOSIS 03/24/2007  . 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 FRACTURE, WRIST, LEFT 10/12/2009   Qualifier: Diagnosis of  By: Loanne Drilling MD, Jacelyn Pi   . HEPATITIS C 03/24/2007  . HYPERLIPIDEMIA 09/12/2009  . Hypocalcemia 12/23/2007   Qualifier: Diagnosis of  By: Loanne Drilling MD, Jacelyn Pi   . Pancytopenia 12/23/2007  . 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 Squamous cell carcinoma of skin 05/21/2018   R mid volar forearm  . Thyroid disease   . UROLITHIASIS, HX OF 12/23/2007     Past Surgical History:  Procedure Laterality Date  . ABOVE KNEE LEG AMPUTATION Right    Right femur fracture, 11/1994, resulted in right AKA 2006 after staph infection    Social History   Socioeconomic History  . Marital status: Widowed    Spouse name: Not on file  . Number of children: Not on file  . Years of education: Not on file  . Highest education level: Not on file  Occupational History  . Occupation: Disabled  Tobacco Use  . Smoking status: Never Smoker  . Smokeless tobacco: Never Used  Substance and Sexual Activity  . Alcohol use: Not Currently    Alcohol/week: 0.0 standard drinks  . Drug use: No  . Sexual activity: Not on file  Other Topics Concern  . Not on file  Social History Narrative   Widowed 1997   Lives alone   Lost a son in his arms in 50.    3 sons   Social Determinants of Health   Financial Resource Strain:   . Difficulty of Paying Living Expenses:   Food Insecurity:   . Worried About Charity fundraiser in the Last Year:   . Arboriculturist in the Last Year:     Transportation Needs:   . Film/video editor (Medical):   Marland Kitchen Lack of Transportation (Non-Medical):   Physical Activity:   . Days of Exercise per Week:   . Minutes of Exercise per Session:   Stress:   . Feeling of Stress :   Social Connections:   . Frequency of Communication with Friends and Family:   . Frequency of Social Gatherings with Friends and Family:   . Attends Religious Services:   . Active Member of Clubs or Organizations:   . Attends Archivist Meetings:   Marland Kitchen Marital Status:   Intimate Partner Violence:   . Fear of Current or Ex-Partner:   . Emotionally Abused:   Marland Kitchen Physically Abused:   . Sexually Abused:     Family History  Problem Relation Age of Onset  . Heart disease Mother   . Lung cancer Mother 35  . Heart attack Paternal Grandmother   . Heart attack Paternal Grandfather   . Liver cancer Neg Hx      Current Outpatient Medications:  .  ALPRAZolam (XANAX) 1 MG tablet, Take 1 mg by mouth 3 (three) times daily.  , Disp: , Rfl:  .  Cholecalciferol (VITAMIN  D3) 10 MCG (400 UNIT) CAPS, Take 4 capsules by mouth daily., Disp: , Rfl:  .  hydrochlorothiazide (HYDRODIURIL) 12.5 MG tablet, Take 1 tablet (12.5 mg total) by mouth daily., Disp: 90 tablet, Rfl: 3 .  levothyroxine (SYNTHROID) 50 MCG tablet, TAKE 1 TABLET(50 MCG) BY MOUTH DAILY BEFORE BREAKFAST, Disp: 30 tablet, Rfl: 3 .  losartan (COZAAR) 100 MG tablet, TAKE 1 TABLET(100 MG) BY MOUTH DAILY, Disp: 90 tablet, Rfl: 1 .  lubiprostone (AMITIZA) 24 MCG capsule, Take 24 mcg by mouth daily as needed. , Disp: , Rfl:  .  NARCAN 4 MG/0.1ML LIQD nasal spray kit, SPRAY 0.1 ML (4 MG) IN 1 NOSTRIL/ MAY REPEAT DOSE EVERY 2 3 MINUTES AS NEEDED ALTERNATING NOSTRILS, Disp: , Rfl:  .  oxycodone (ROXICODONE) 30 MG immediate release tablet, Take 1 tablet by mouth 5 (five) times daily as needed., Disp: , Rfl:  .  propranolol (INDERAL) 40 MG tablet, Take 1 tablet (40 mg total) by mouth 2 (two) times daily., Disp: 120  tablet, Rfl: 1 .  vitamin B-12 (CYANOCOBALAMIN) 1000 MCG tablet, Take 1 tablet (1,000 mcg total) by mouth daily., Disp: , Rfl:  .  folic acid (FOLVITE) 1 MG tablet, Take 1 tablet (1 mg total) by mouth daily. (Patient not taking: Reported on 02/11/2020), Disp: 30 tablet, Rfl: 3  Physical exam:  Vitals:   02/11/20 1459  BP: (!) 98/50  Pulse: 70  Resp: 16  Temp: 99.6 F (37.6 C)  TempSrc: Tympanic  SpO2: 96%  Weight: 210 lb (95.3 kg)   Physical Exam Constitutional:      Comments: He is sitting in a wheelchair.  Appears in no acute distress.  He is s/p right AKA  Cardiovascular:     Rate and Rhythm: Normal rate and regular rhythm.     Heart sounds: Normal heart sounds.  Pulmonary:     Effort: Pulmonary effort is normal.     Breath sounds: Normal breath sounds.  Abdominal:     General: Bowel sounds are normal.     Palpations: Abdomen is soft.  Musculoskeletal:     Cervical back: Normal range of motion.  Skin:    General: Skin is warm and dry.  Neurological:     Mental Status: He is alert and oriented to person, place, and time.      CMP Latest Ref Rng & Units 02/09/2020  Glucose 70 - 99 mg/dL 113(H)  BUN 8 - 23 mg/dL 13  Creatinine 0.61 - 1.24 mg/dL 0.72  Sodium 135 - 145 mmol/L 137  Potassium 3.5 - 5.1 mmol/L 3.9  Chloride 98 - 111 mmol/L 102  CO2 22 - 32 mmol/L 27  Calcium 8.9 - 10.3 mg/dL 8.8(L)  Total Protein 6.5 - 8.1 g/dL 7.4  Total Bilirubin 0.3 - 1.2 mg/dL 1.9(H)  Alkaline Phos 38 - 126 U/L 57  AST 15 - 41 U/L 20  ALT 0 - 44 U/L 17   CBC Latest Ref Rng & Units 02/09/2020  WBC 4.0 - 10.5 K/uL 2.8(L)  Hemoglobin 13.0 - 17.0 g/dL 8.7(L)  Hematocrit 39 - 52 % 25.6(L)  Platelets 150 - 400 K/uL 42(L)    Assessment and plan- Patient is a 69 y.o. 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  Anemia work-up revealed  evidence of hemolysis given the reticulocytosis and haptoglobin less than 10.  However Coombs test was negative.  PNH testing was also negative.  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  This type of hemolysis does not respond to steroids or IVIG as it is not immune mediated.  I am inclined to monitor his anemia conservatively and if there is a further drop in his hemoglobin I will consider getting a bone marrow biopsy at that time  Thrombocytopenia: Suspect this is again secondary to Cirrhosis his platelet counts wildly fluctuate and was up to 99 in May 2021 and now down to 42.  Historically his platelet counts have been between 40s to 60s.  Continue to monitor   I will see him back in 2 months with CBC with differential, reticulocyte count, haptoglobin, CMP, Coombs test  Visit Diagnosis 1. Other non-autoimmune hemolytic anemias ([place])      Dr. Randa Evens, MD, MPH [facility] [phone] 02/13/2020 12:08 PM

## 2020-02-28 NOTE — Progress Notes (Signed)
No show

## 2020-02-29 ENCOUNTER — Encounter: Payer: Medicare Other | Admitting: Pain Medicine

## 2020-02-29 DIAGNOSIS — R5381 Other malaise: Secondary | ICD-10-CM | POA: Insufficient documentation

## 2020-02-29 DIAGNOSIS — S0590XA Unspecified injury of unspecified eye and orbit, initial encounter: Secondary | ICD-10-CM | POA: Insufficient documentation

## 2020-02-29 DIAGNOSIS — B182 Chronic viral hepatitis C: Secondary | ICD-10-CM | POA: Insufficient documentation

## 2020-02-29 DIAGNOSIS — Z7189 Other specified counseling: Secondary | ICD-10-CM | POA: Insufficient documentation

## 2020-02-29 DIAGNOSIS — K7460 Unspecified cirrhosis of liver: Secondary | ICD-10-CM | POA: Insufficient documentation

## 2020-02-29 DIAGNOSIS — Z862 Personal history of diseases of the blood and blood-forming organs and certain disorders involving the immune mechanism: Secondary | ICD-10-CM | POA: Insufficient documentation

## 2020-02-29 DIAGNOSIS — Z789 Other specified health status: Secondary | ICD-10-CM | POA: Insufficient documentation

## 2020-02-29 DIAGNOSIS — K766 Portal hypertension: Secondary | ICD-10-CM | POA: Insufficient documentation

## 2020-02-29 DIAGNOSIS — Z79899 Other long term (current) drug therapy: Secondary | ICD-10-CM | POA: Insufficient documentation

## 2020-02-29 DIAGNOSIS — Z89619 Acquired absence of unspecified leg above knee: Secondary | ICD-10-CM | POA: Insufficient documentation

## 2020-02-29 DIAGNOSIS — M899 Disorder of bone, unspecified: Secondary | ICD-10-CM | POA: Insufficient documentation

## 2020-03-13 NOTE — Progress Notes (Signed)
This patient's chart is under "My Open Charts". These are cancelled appointments that keep popping into my "In Basket" as a deficiency. See what you can do to remove them.   Thank you. 

## 2020-03-15 ENCOUNTER — Other Ambulatory Visit: Payer: Self-pay | Admitting: Oncology

## 2020-03-15 ENCOUNTER — Other Ambulatory Visit: Payer: Self-pay | Admitting: Family Medicine

## 2020-03-15 ENCOUNTER — Other Ambulatory Visit: Payer: Self-pay | Admitting: Family

## 2020-03-15 DIAGNOSIS — I10 Essential (primary) hypertension: Secondary | ICD-10-CM

## 2020-04-06 ENCOUNTER — Other Ambulatory Visit: Payer: Self-pay

## 2020-04-06 ENCOUNTER — Inpatient Hospital Stay: Payer: Medicare Other | Attending: Oncology

## 2020-04-06 DIAGNOSIS — K7469 Other cirrhosis of liver: Secondary | ICD-10-CM | POA: Insufficient documentation

## 2020-04-06 DIAGNOSIS — D589 Hereditary hemolytic anemia, unspecified: Secondary | ICD-10-CM | POA: Insufficient documentation

## 2020-04-06 DIAGNOSIS — D6959 Other secondary thrombocytopenia: Secondary | ICD-10-CM | POA: Diagnosis not present

## 2020-04-06 DIAGNOSIS — R161 Splenomegaly, not elsewhere classified: Secondary | ICD-10-CM | POA: Insufficient documentation

## 2020-04-06 DIAGNOSIS — D594 Other nonautoimmune hemolytic anemias: Secondary | ICD-10-CM

## 2020-04-06 LAB — CBC WITH DIFFERENTIAL/PLATELET
Abs Immature Granulocytes: 0.03 10*3/uL (ref 0.00–0.07)
Basophils Absolute: 0 10*3/uL (ref 0.0–0.1)
Basophils Relative: 0 %
Eosinophils Absolute: 0 10*3/uL (ref 0.0–0.5)
Eosinophils Relative: 1 %
HCT: 34.5 % — ABNORMAL LOW (ref 39.0–52.0)
Hemoglobin: 11.8 g/dL — ABNORMAL LOW (ref 13.0–17.0)
Immature Granulocytes: 1 %
Lymphocytes Relative: 19 %
Lymphs Abs: 0.7 10*3/uL (ref 0.7–4.0)
MCH: 33.1 pg (ref 26.0–34.0)
MCHC: 34.2 g/dL (ref 30.0–36.0)
MCV: 96.9 fL (ref 80.0–100.0)
Monocytes Absolute: 0.3 10*3/uL (ref 0.1–1.0)
Monocytes Relative: 7 %
Neutro Abs: 2.6 10*3/uL (ref 1.7–7.7)
Neutrophils Relative %: 72 %
Platelets: 50 10*3/uL — ABNORMAL LOW (ref 150–400)
RBC: 3.56 MIL/uL — ABNORMAL LOW (ref 4.22–5.81)
RDW: 17.2 % — ABNORMAL HIGH (ref 11.5–15.5)
WBC: 3.6 10*3/uL — ABNORMAL LOW (ref 4.0–10.5)
nRBC: 0 % (ref 0.0–0.2)

## 2020-04-06 LAB — COMPREHENSIVE METABOLIC PANEL
ALT: 23 U/L (ref 0–44)
AST: 26 U/L (ref 15–41)
Albumin: 5 g/dL (ref 3.5–5.0)
Alkaline Phosphatase: 58 U/L (ref 38–126)
Anion gap: 12 (ref 5–15)
BUN: 14 mg/dL (ref 8–23)
CO2: 24 mmol/L (ref 22–32)
Calcium: 9.4 mg/dL (ref 8.9–10.3)
Chloride: 99 mmol/L (ref 98–111)
Creatinine, Ser: 0.59 mg/dL — ABNORMAL LOW (ref 0.61–1.24)
GFR calc Af Amer: >60 mL/min (ref >60)
GFR calc non Af Amer: >60 mL/min (ref >60)
Glucose, Bld: 114 mg/dL — ABNORMAL HIGH (ref 70–99)
Potassium: 3.8 mmol/L (ref 3.5–5.1)
Sodium: 135 mmol/L (ref 135–145)
Total Bilirubin: 2.8 mg/dL — ABNORMAL HIGH (ref 0.3–1.2)
Total Protein: 8.6 g/dL — ABNORMAL HIGH (ref 6.5–8.1)

## 2020-04-06 LAB — RETICULOCYTES
Immature Retic Fract: 18.2 % — ABNORMAL HIGH (ref 2.3–15.9)
RBC.: 3.2 MIL/uL — ABNORMAL LOW (ref 4.22–5.81)
Retic Count, Absolute: 261 10*3/uL — ABNORMAL HIGH (ref 19.0–186.0)
Retic Ct Pct: 8.2 % — ABNORMAL HIGH (ref 0.4–3.1)

## 2020-04-06 LAB — DAT, POLYSPECIFIC AHG (ARMC ONLY): Polyspecific AHG test: NEGATIVE

## 2020-04-07 LAB — HAPTOGLOBIN: Haptoglobin: <10 mg/dL — ABNORMAL LOW (ref 32–363)

## 2020-04-08 ENCOUNTER — Inpatient Hospital Stay (HOSPITAL_BASED_OUTPATIENT_CLINIC_OR_DEPARTMENT_OTHER): Payer: Medicare Other | Admitting: Oncology

## 2020-04-08 ENCOUNTER — Encounter: Payer: Self-pay | Admitting: Oncology

## 2020-04-08 ENCOUNTER — Other Ambulatory Visit: Payer: Self-pay

## 2020-04-08 VITALS — BP 134/61 | HR 67 | Temp 97.5°F | Resp 16 | Wt 215.0 lb

## 2020-04-08 DIAGNOSIS — K7469 Other cirrhosis of liver: Secondary | ICD-10-CM

## 2020-04-08 DIAGNOSIS — D649 Anemia, unspecified: Secondary | ICD-10-CM | POA: Diagnosis not present

## 2020-04-08 DIAGNOSIS — D589 Hereditary hemolytic anemia, unspecified: Secondary | ICD-10-CM | POA: Diagnosis not present

## 2020-04-13 ENCOUNTER — Telehealth (HOSPITAL_COMMUNITY): Payer: Self-pay | Admitting: *Deleted

## 2020-04-13 NOTE — Telephone Encounter (Signed)
Opened in error

## 2020-04-13 NOTE — Telephone Encounter (Signed)
Pharmacy requested refill of Xanax.  Patient appt not until 11/21.Please advise.

## 2020-04-14 NOTE — Progress Notes (Signed)
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 He has seen Dr. Allen Norris for cirrhosis in the past. He has not had any recent episodes of ascites. 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 With regards to his CBC patient has had baseline thrombocytopenia and his platelet counts mainly fluctuate between 40-60 at least since 2015. He also has chronic leukopenia with a white count that fluctuates between 2.3-3.5 at least dating back to 2015. Differential has mainly showed lymphopenia but at times neutropenia. Hemoglobin has been mostly between 9.5-10.5 most recently patient was found to have a white count of 3.5, H&H of 9.7/28.8 and a platelet count of 65 and has been referred to [place] for pancytopenia. In terms of his CMP he has had elevated bilirubin which fluctuates between 1.6-2.9 in the past but was recently noted to have a bilirubin of 3.1 mostly indirect hyperbilirubinemia with normal AST and ALT  Patient uses a motorized wheelchair for ambulation but is  independent of his ADLs and IADLs and isable to drive as well. Appetite and weight have been stable. 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 Currently patient denies any bleeding in his stool or urine. Denies any dark tarry stools  Results of blood work from 12/21/2019 were as follows: CBC showed white count of 6.2, H&H of 10.3/30.2 with an MCV of 96 and a platelet count of 99. Total bilirubin was elevated at 3.1 with predominantly indirect hyperbilirubinemia. Folic acid was normal at 6.6. B12 levels were elevated at 1441. Ferritin levels were elevated at 540 iron studies were normal. Haptoglobin was less than 10. Reticulocyte count was elevated at 10.6. Myeloma panel showed no M protein. LDH was mildly elevated at 256. Coombs test was negative. AFP was normal at 4.3.  Interval history-no new complaints since his last visit.  He has chronic fatigue.  No bleeding bruising or recurrent infections.  Denies any bleeding in his stool or urine.  Denies any dark melanotic stools  ECOG PS- 2 Pain scale- 0   Review of systems- Review of Systems  Constitutional: Positive for malaise/fatigue. Negative for chills, fever and weight loss.  HENT: Negative for congestion, ear discharge and nosebleeds.   Eyes: Negative for blurred vision.  Respiratory: Negative for cough, hemoptysis, sputum production, shortness of breath and wheezing.   Cardiovascular: Negative for chest pain, palpitations, orthopnea and claudication.  Gastrointestinal: Negative for abdominal pain, blood in stool, constipation, diarrhea, heartburn, melena, nausea and vomiting.  Genitourinary: Negative for dysuria, flank pain, frequency, hematuria and urgency.  Musculoskeletal: Negative for back pain, joint pain and myalgias.  Skin: Negative for rash.  Neurological: Negative for dizziness, tingling, focal  weakness, seizures, weakness  and headaches.  Endo/Heme/Allergies: Does not bruise/bleed easily.  Psychiatric/Behavioral: Negative for depression and suicidal ideas. The patient does not have insomnia.       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   Squamous cell carcinoma of skin 05/21/2018   R mid volar forearm   Thyroid disease    UROLITHIASIS, HX OF 12/23/2007     Past Surgical History:  Procedure Laterality Date   ABOVE KNEE LEG AMPUTATION Right    Right femur fracture, 11/1994, resulted in right AKA 2006 after staph infection    Social History   Socioeconomic History   Marital status: Widowed    Spouse name: Not on file   Number of children: Not on file   Years of education: Not on file   Highest education level: Not on file  Occupational History   Occupation: Disabled  Tobacco Use   Smoking status: Never Smoker   Smokeless tobacco: Never Used  Substance and Sexual Activity   Alcohol use: Not Currently    Alcohol/week: 0.0 standard drinks   Drug use: No   Sexual activity: Not on file  Other Topics Concern   Not on file  Social History Narrative   Widowed 1997   Lives alone   Lost a son in his arms in 67.    3 sons   Social Determinants of Health   Financial Resource Strain:    Difficulty of Paying Living Expenses: Not on file  Food Insecurity:    Worried About Charity fundraiser in the Last Year: Not on file   [employer] of Food in the Last  Year: Not on file  Transportation Needs:    Lack of Transportation (Medical): Not on file   Lack of Transportation (Non-Medical): Not on file  Physical Activity:    Days of Exercise per Week: Not on file   Minutes of Exercise per Session: Not on file  Stress:    Feeling of Stress : Not on file  Social Connections:    Frequency of Communication with Friends and Family: Not on file   Frequency of Social Gatherings with Friends and Family: Not on file   Attends Religious Services: Not on file   Active Member of Clubs or Organizations: Not on file   Attends Archivist Meetings: Not on file   Marital Status: Not on file  Intimate Partner Violence:    Fear of Current or Ex-Partner: Not on file   Emotionally Abused: Not on file   Physically Abused: Not on file   Sexually Abused: Not on file    Family History  Problem Relation Age of Onset   Heart disease Mother    Lung cancer Mother 4   Heart attack Paternal Grandmother    Heart attack Paternal Grandfather    Liver cancer Neg Hx      Current Outpatient Medications:  ALPRAZolam (XANAX) 1 MG tablet, Take 1 mg by mouth 3 (three) times daily.  , Disp: , Rfl:    Cholecalciferol (VITAMIN D3) 10 MCG (400 UNIT) CAPS, Take 4 capsules by mouth daily., Disp: , Rfl:    folic acid (FOLVITE) 1 MG tablet, TAKE 1 TABLET(1 MG) BY MOUTH DAILY, Disp: 30 tablet, Rfl: 3   levothyroxine (SYNTHROID) 50 MCG tablet, TAKE 1 TABLET(50 MCG) BY MOUTH DAILY BEFORE BREAKFAST, Disp: 30 tablet, Rfl: 3   losartan (COZAAR) 100 MG tablet, TAKE 1 TABLET(100 MG) BY MOUTH DAILY, Disp: 90 tablet, Rfl: 1   morphine (MS CONTIN) 15 MG 12 hr tablet, Take 15 mg by mouth 2 (two) times daily as needed., Disp: , Rfl:    NARCAN 4 MG/0.1ML LIQD nasal spray kit, SPRAY 0.1 ML (4 MG) IN 1 NOSTRIL/ MAY REPEAT DOSE EVERY 2 3 MINUTES AS NEEDED ALTERNATING NOSTRILS, Disp: , Rfl:    oxycodone (ROXICODONE) 30 MG immediate release tablet, Take 1  tablet by mouth 5 (five) times daily as needed., Disp: , Rfl:    propranolol (INDERAL) 40 MG tablet, TAKE 1 TABLET(40 MG) BY MOUTH TWICE DAILY, Disp: 120 tablet, Rfl: 1   lubiprostone (AMITIZA) 24 MCG capsule, Take 24 mcg by mouth daily as needed.  (Patient not taking: Reported on 04/08/2020), Disp: , Rfl:    vitamin B-12 (CYANOCOBALAMIN) 1000 MCG tablet, Take 1 tablet (1,000 mcg total) by mouth daily., Disp: , Rfl:   Physical exam:  Vitals:   04/08/20 1350  BP: 134/61  Pulse: 67  Resp: 16  Temp: (!) 97.5 F (36.4 C)  SpO2: 96%  Weight: 215 lb (97.5 kg)   Physical Exam Constitutional:      Comments: Sitting in a wheelchair.  Appears in no acute distress  Cardiovascular:     Rate and Rhythm: Normal rate and regular rhythm.     Heart sounds: Normal heart sounds.  Pulmonary:     Effort: Pulmonary effort is normal.     Breath sounds: Normal breath sounds.  Abdominal:     General: Bowel sounds are normal.     Palpations: Abdomen is soft.  Musculoskeletal:     Comments: S/p right AKA.  Left leg +1 edema  Skin:    General: Skin is warm and dry.  Neurological:     Mental Status: He is alert and oriented to person, place, and time.      CMP Latest Ref Rng & Units 04/06/2020  Glucose 70 - 99 mg/dL 114(H)  BUN 8 - 23 mg/dL 14  Creatinine 0.61 - 1.24 mg/dL 0.59(L)  Sodium 135 - 145 mmol/L 135  Potassium 3.5 - 5.1 mmol/L 3.8  Chloride 98 - 111 mmol/L 99  CO2 22 - 32 mmol/L 24  Calcium 8.9 - 10.3 mg/dL 9.4  Total Protein 6.5 - 8.1 g/dL 8.6(H)  Total Bilirubin 0.3 - 1.2 mg/dL 2.8(H)  Alkaline Phos 38 - 126 U/L 58  AST 15 - 41 U/L 26  ALT 0 - 44 U/L 23   CBC Latest Ref Rng & Units 04/06/2020  WBC 4.0 - 10.5 K/uL 3.6(L)  Hemoglobin 13.0 - 17.0 g/dL 11.8(L)  Hematocrit 39 - 52 % 34.5(L)  Platelets 150 - 400 K/uL 50(L)     Assessment and plan- Patient is a 69 y.o. 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  Normocytic anemia: This waxes and wanes and was down to 8.7 in July  and up to 11.8 today.  Coombs test again was negative.  PNH profile negative.  Haptoglobin remains less than 10 and reticulocyte count elevated at 11.7.  No indication for bone marrow biopsy at this time.  He would also not benefit from steroids or Rituxan as this is nonimmune hemolysis.  He will continue folic acid supplements  2.  Thrombocytopenia: Secondary to cirrhosis and splenomegaly.  Platelet counts have remained stable between 50s to 60s and again waxes and wanes.  3.  Leukopenia: Mild likely secondary to splenic sequestration.  Continue to monitor  4. hcc surveillance: I will consider ordering abdominal ultrasound at next visit.  Baseline AFP was normal  Visit Diagnosis 1. Normocytic anemia   2. Other cirrhosis of liver ([place])      Dr. Randa Evens, MD, MPH [facility] at [facility] [phone] 04/14/2020 9:01 AM

## 2020-05-16 ENCOUNTER — Telehealth: Payer: Self-pay

## 2020-05-16 ENCOUNTER — Other Ambulatory Visit: Payer: Self-pay

## 2020-05-16 NOTE — Telephone Encounter (Signed)
I spoke with patient & he stated he was no longer taking HCTZ. We had received refill request from [facility]. I see where it was d/c by someone idk? It says d/c by provider. Just wanted to make you aware & I have not refilled per patient. He states that his BP is the low end when he checks.

## 2020-05-17 NOTE — Telephone Encounter (Signed)
BP Readings from Last 3 Encounters:  04/08/20 134/61  02/11/20 (!) 98/50  01/04/20 (!) 133/47   Please sch in person f/u so we can check BP in office since off of hctz

## 2020-05-17 NOTE — Telephone Encounter (Signed)
Pt has f/u in 08/05/20 are you okay with patient waiting until then? Currently you are booked until last week oif November.

## 2020-05-18 NOTE — Telephone Encounter (Signed)
Lets move up, nov or early dec is fine

## 2020-05-18 NOTE — Telephone Encounter (Signed)
Just FYI patients appointment moved up to 11/22. He will bring his medications bc he does know their names only their shapes & colors.

## 2020-05-18 NOTE — Telephone Encounter (Signed)
LMTCB to move patient's appointment up sooner.

## 2020-05-24 ENCOUNTER — Other Ambulatory Visit: Payer: Self-pay | Admitting: Endocrinology

## 2020-05-24 DIAGNOSIS — E039 Hypothyroidism, unspecified: Secondary | ICD-10-CM

## 2020-06-05 ENCOUNTER — Other Ambulatory Visit: Payer: Self-pay | Admitting: Family

## 2020-06-05 DIAGNOSIS — I10 Essential (primary) hypertension: Secondary | ICD-10-CM

## 2020-06-07 ENCOUNTER — Ambulatory Visit: Payer: Medicare Other | Admitting: Endocrinology

## 2020-06-20 ENCOUNTER — Ambulatory Visit: Payer: Medicare Other | Admitting: Family

## 2020-07-08 ENCOUNTER — Inpatient Hospital Stay: Payer: Medicare Other | Attending: Oncology

## 2020-07-14 ENCOUNTER — Ambulatory Visit: Payer: Medicare Other | Attending: Oncology

## 2020-08-01 ENCOUNTER — Telehealth (INDEPENDENT_AMBULATORY_CARE_PROVIDER_SITE_OTHER): Payer: Medicare Other | Admitting: Endocrinology

## 2020-08-01 ENCOUNTER — Other Ambulatory Visit: Payer: Self-pay

## 2020-08-01 ENCOUNTER — Encounter: Payer: Self-pay | Admitting: Endocrinology

## 2020-08-01 VITALS — Ht 72.0 in | Wt 220.0 lb

## 2020-08-01 DIAGNOSIS — E039 Hypothyroidism, unspecified: Secondary | ICD-10-CM

## 2020-08-01 NOTE — Progress Notes (Signed)
Subjective:    Patient ID: [name]., male    DOB: April 30, 1951, 70 y.o.   MRN: [id number]  HPI  telehealth visit today via phone x 10 minutes.   Alternatives to telehealth are presented to this patient, and the patient agrees to the telehealth visit. Pt is advised of the cost of the visit, and agrees to this, also.   Patient is at home, and I am at the office.   Persons attending the telehealth visit: the patient and I.   Pt returns for f/u of chronic primary hypothyroidism (dx'ed 2015; he has been on synthroid since then; he has never had thyroid imaging).   pt states he feels well in general, except for phantom limb pain.  He takes synthroid as rx'ed.  Past Medical History:  Diagnosis Date  . 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 ANXIETY 03/24/2007  . Chronic pain   . CIRRHOSIS 03/24/2007  . 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 FRACTURE, WRIST, LEFT 10/12/2009   Qualifier: Diagnosis of  By: Loanne Drilling MD, Jacelyn Pi   . HEPATITIS C 03/24/2007  . HYPERLIPIDEMIA 09/12/2009  . Hypocalcemia 12/23/2007   Qualifier: Diagnosis of  By: Loanne Drilling MD, Jacelyn Pi   . Pancytopenia 12/23/2007  . 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 Squamous cell carcinoma of skin 05/21/2018   R mid volar forearm  . Thyroid disease   . UROLITHIASIS, HX OF 12/23/2007    Past Surgical History:  Procedure Laterality Date  . ABOVE KNEE LEG AMPUTATION Right    Right femur fracture, 11/1994, resulted in right AKA 2006 after staph infection    Social History   Socioeconomic History  . Marital status: Widowed    Spouse name: Not on file  . Number of children: Not on file  . Years of education: Not on file  . Highest education level: Not on file  Occupational History  . Occupation: Disabled  Tobacco Use  . Smoking status: Never Smoker  . Smokeless tobacco: Never Used  Substance and Sexual Activity  . Alcohol use: Not Currently    Alcohol/week: 0.0 standard drinks   . Drug use: No  . Sexual activity: Not on file  Other Topics Concern  . Not on file  Social History Narrative   Widowed 1997   Lives alone   Lost a son in his arms in 82.    3 sons   Social Determinants of Health   Financial Resource Strain: Not on file  Food Insecurity: Not on file  Transportation Needs: Not on file  Physical Activity: Not on file  Stress: Not on file  Social Connections: Not on file  Intimate Partner Violence: Not on file    Current Outpatient Medications on File Prior to Visit  Medication Sig Dispense Refill  . ALPRAZolam (XANAX) 1 MG tablet Take 1 mg by mouth 3 (three) times daily.    . Cholecalciferol (VITAMIN D3) 10 MCG (400 UNIT) CAPS Take 4 capsules by mouth daily.    . folic acid (FOLVITE) 1 MG tablet TAKE 1 TABLET(1 MG) BY MOUTH DAILY 30 tablet 3  . levothyroxine (SYNTHROID) 50 MCG tablet TAKE 1 TABLET(50 MCG) BY MOUTH DAILY BEFORE BREAKFAST 30 tablet 3  . losartan (COZAAR) 100 MG tablet TAKE 1 TABLET(100 MG) BY MOUTH DAILY 90 tablet 1  . lubiprostone (AMITIZA) 24 MCG capsule Take 24 mcg by mouth daily as needed.    Marland Kitchen morphine (  MS CONTIN) 15 MG 12 hr tablet Take 15 mg by mouth 2 (two) times daily as needed.    Marland Kitchen NARCAN 4 MG/0.1ML LIQD nasal spray kit SPRAY 0.1 ML (4 MG) IN 1 NOSTRIL/ MAY REPEAT DOSE EVERY 2 3 MINUTES AS NEEDED ALTERNATING NOSTRILS    . oxycodone (ROXICODONE) 30 MG immediate release tablet Take 1 tablet by mouth 5 (five) times daily as needed.    . propranolol (INDERAL) 40 MG tablet TAKE 1 TABLET(40 MG) BY MOUTH TWICE DAILY 180 tablet 1  . vitamin B-12 (CYANOCOBALAMIN) 1000 MCG tablet Take 1 tablet (1,000 mcg total) by mouth daily.     No current facility-administered medications on file prior to visit.    Allergies  Allergen Reactions  . Clindamycin Dermatitis    Family History  Problem Relation Age of Onset  . Heart disease Mother   . Lung cancer Mother 3  . Heart attack Paternal Grandmother   . Heart attack Paternal  Grandfather   . Liver cancer Neg Hx     Ht 6' (1.829 m)   Wt 220 lb (99.8 kg)   BMI 29.84 kg/m    Review of Systems     Objective:   Physical Exam      Assessment & Plan:  Hypothyroidism, due for recheck.    Patient Instructions  Blood tests are requested for you today.  We'll let you know about the results.   It is best to never miss the levothyroxine.  However, if you do miss it, next best is to double up the next time.   Please come back for a follow-up appointment in 6 months.

## 2020-08-01 NOTE — Patient Instructions (Addendum)
Blood tests are requested for you today.  We'll let you know about the results.   ?It is best to never miss the levothyroxine.  However, if you do miss it, next best is to double up the next time.   ?Please come back for a follow-up appointment in 6 months.   ? ?

## 2020-08-04 ENCOUNTER — Ambulatory Visit: Payer: Medicare Other

## 2020-08-05 ENCOUNTER — Encounter: Payer: Self-pay | Admitting: Family

## 2020-08-05 ENCOUNTER — Other Ambulatory Visit: Payer: Self-pay | Admitting: Oncology

## 2020-08-05 ENCOUNTER — Ambulatory Visit: Payer: Medicare Other | Admitting: Family

## 2020-08-05 ENCOUNTER — Telehealth: Payer: Self-pay

## 2020-08-05 ENCOUNTER — Telehealth (INDEPENDENT_AMBULATORY_CARE_PROVIDER_SITE_OTHER): Payer: Medicare Other | Admitting: Family

## 2020-08-05 VITALS — Wt 220.0 lb

## 2020-08-05 DIAGNOSIS — E039 Hypothyroidism, unspecified: Secondary | ICD-10-CM

## 2020-08-05 DIAGNOSIS — D649 Anemia, unspecified: Secondary | ICD-10-CM

## 2020-08-05 DIAGNOSIS — Z23 Encounter for immunization: Secondary | ICD-10-CM

## 2020-08-05 DIAGNOSIS — I10 Essential (primary) hypertension: Secondary | ICD-10-CM

## 2020-08-05 DIAGNOSIS — K7460 Unspecified cirrhosis of liver: Secondary | ICD-10-CM

## 2020-08-05 NOTE — Assessment & Plan Note (Signed)
Controlled based on readings from other offices. Continue  losartan 100mg , propranolol 40mg  BID. Advised to have IN person visit at follow up so I may assess myself. Advised to get batteries for BP machine at home so he can monitor at home. Patient verbalized understanding.

## 2020-08-05 NOTE — Patient Instructions (Addendum)
Please call Dr Allen Norris, [facility] at [phone] for follow up regarding cirrhosis. You are also do for colon cancer screening, please discus this with Dr Allen Norris as well.  Call Dr Elroy Channel office as well to schedule ultrasound of abdomen.   Always a pleasure to speaking with you; your next visit, please make in person.

## 2020-08-05 NOTE — Progress Notes (Signed)
Verbal consent for services obtained from patient prior to services given to TELEPHONE visit:   Location of call:  provider at work patient at home  Names of all persons present for services: Mable Paris, NP and patient Chief complaint:    Feels well today. He is sitting in his car for visit. He would like to get flu vaccine while sitting in his car today.   No complaints.   HTN- compliant with losartan 148m, propranolol 468mBID. No sob, cp.   Hypothyroidism- follows with Dr ElLoanne Drillinghom ordered repeat thyroid studies 08/02/19. Compliant with synthroid 5043m Anemia- follows with Dr RaoJanese Bankslast seen 03/2020 whom stated no bone marrow biopsy indicated. Thrombocytopenia secondary to cirrhosis and splenomegaly. Pending ultrasound of abdomen.   Cirrhosis last seen by Dr WohAllen Norris2021. Due for follow up.   Left knee pain- using wheelchair due to pain. Follows with Dr KraLorie Apleyth pain management, Dr RavClarnce FlockHe is on roxicodone.   BP Readings from Last 3 Encounters:  04/08/20 134/61  02/11/20 (!) 98/50  01/04/20 (!) 133/47    A/P/next steps:  Problem List Items Addressed This Visit      Cardiovascular and Mediastinum   HTN (hypertension)    Controlled based on readings from other offices. Continue  losartan 100m48mropranolol 40mg63m. Advised to have IN person visit at follow up so I may assess myself. Advised to get batteries for BP machine at home so he can monitor at home. Patient verbalized understanding.         Digestive   Hepatic cirrhosis (HCC) [place]. I have provided patient with number to Dr Wohl Allen Norrispatient states he will call to schedule a follow up.         Endocrine   Hypothyroidism    Stable. Advised he needs to complete labs with Dr elisoArnoldo Lenistinue synthroid.         Other   Anemia    Following closely with Dr Rao. Janese Banksding US abKoreamen. Will follow.        Other Visit Diagnoses    Need for immunization against  influenza    -  Primary   Relevant Orders   Flu Vaccine QUAD High Dose(Fluad) (Completed)       I spent 18 min  discussing plan of care over the phone.

## 2020-08-05 NOTE — Assessment & Plan Note (Signed)
Following closely with Dr Janese Banks. Pending US abdomen. Will follow.

## 2020-08-05 NOTE — Telephone Encounter (Signed)
lvm to schedule 3 month follow up °

## 2020-08-05 NOTE — Assessment & Plan Note (Signed)
Stable. Advised he needs to complete labs with Dr Arnoldo Lenis. Continue synthroid.

## 2020-08-05 NOTE — Assessment & Plan Note (Signed)
Overdue for follow up. I have provided patient with number to Dr Allen Norris and patient states he will call to schedule a follow up.

## 2020-08-08 ENCOUNTER — Ambulatory Visit (INDEPENDENT_AMBULATORY_CARE_PROVIDER_SITE_OTHER): Payer: Medicare Other

## 2020-08-08 VITALS — Ht 73.0 in | Wt 220.0 lb

## 2020-08-08 DIAGNOSIS — Z0000 Encounter for general adult medical examination without abnormal findings: Secondary | ICD-10-CM | POA: Diagnosis not present

## 2020-08-08 NOTE — Progress Notes (Addendum)
Subjective:   Derek Anon Gessel [name]. is a 70 y.o. male who presents for Medicare Annual/Subsequent preventive examination.  Review of Systems    No ROS.  Medicare Wellness Virtual Visit.   Cardiac Risk Factors include: advanced age (>49mn, >>109women);male gender;hypertension     Objective:    Today's Vitals   08/08/20 1037  Weight: 220 lb (99.8 kg)  Height: 6' 1"  (1.854 m)   Body mass index is 29.03 kg/m.  Advanced Directives 08/08/2020 04/08/2020 02/11/2020 01/04/2020 12/21/2019 12/21/2019 08/04/2019  Does Patient Have a Medical Advance Directive? Yes Yes No Yes No No No  Type of AParamedicof [place] will [place] will - [place] Does patient want to make changes to medical advance directive? No - Patient declined - - No - Patient declined - - No - Patient declined  Copy of [place] Chart? Yes - validated most recent copy scanned in chart (See row information) - - Yes - validated most recent copy scanned in chart (See row information) - - -  Would patient like information on creating a medical advance directive? - - - No - Patient declined No - Patient declined Yes (MAU/Ambulatory/Procedural Areas - Information given) -    Current Medications (verified) Outpatient Encounter Medications as of 08/08/2020  Medication Sig  . ALPRAZolam (XANAX) 1 MG tablet Take 1 mg by mouth 3 (three) times daily.  . Cholecalciferol (VITAMIN D3) 10 MCG (400 UNIT) CAPS Take 4 capsules by mouth daily.  . folic acid (FOLVITE) 1 MG tablet TAKE 1 TABLET(1 MG) BY MOUTH DAILY  . levothyroxine (SYNTHROID) 50 MCG tablet TAKE 1 TABLET(50 MCG) BY MOUTH DAILY BEFORE BREAKFAST  . losartan (COZAAR) 100 MG tablet TAKE 1 TABLET(100 MG) BY MOUTH DAILY  . lubiprostone (AMITIZA) 24 MCG capsule Take 24 mcg by mouth daily as needed.  .Marland Kitchenmorphine (MS CONTIN) 15 MG 12 hr tablet Take 15 mg by mouth 2 (two) times daily as  needed.  .Marland KitchenNARCAN 4 MG/0.1ML LIQD nasal spray kit SPRAY 0.1 ML (4 MG) IN 1 NOSTRIL/ MAY REPEAT DOSE EVERY 2 3 MINUTES AS NEEDED ALTERNATING NOSTRILS  . oxycodone (ROXICODONE) 30 MG immediate release tablet Take 1 tablet by mouth 5 (five) times daily as needed.  . propranolol (INDERAL) 40 MG tablet TAKE 1 TABLET(40 MG) BY MOUTH TWICE DAILY  . vitamin B-12 (CYANOCOBALAMIN) 1000 MCG tablet Take 1 tablet (1,000 mcg total) by mouth daily.   No facility-administered encounter medications on file as of 08/08/2020.    Allergies (verified) Clindamycin   History: Past Medical History:  Diagnosis Date  . AKA, RIGHT, HX OF 12/23/2007   Qualifier: Diagnosis of  By: ELoanne DrillingMD, SJacelyn Pi  . ANXIETY 03/24/2007  . Chronic pain   . CIRRHOSIS 03/24/2007  . 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 FRACTURE, WRIST, LEFT 10/12/2009   Qualifier: Diagnosis of  By: ELoanne DrillingMD, SJacelyn Pi  . HEPATITIS C 03/24/2007  . HYPERLIPIDEMIA 09/12/2009  . Hypocalcemia 12/23/2007   Qualifier: Diagnosis of  By: ELoanne DrillingMD, SJacelyn Pi  . Pancytopenia 12/23/2007  . 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 Squamous cell carcinoma of skin 05/21/2018   R mid volar forearm  . Thyroid disease   . UROLITHIASIS, HX OF 12/23/2007   Past Surgical History:  Procedure Laterality Date  . ABOVE KNEE LEG AMPUTATION Right    Right femur fracture, 11/1994, resulted in right AKA  2006 after staph infection   Family History  Problem Relation Age of Onset  . Heart disease Mother   . Lung cancer Mother 38  . Heart attack Paternal Grandmother   . Heart attack Paternal Grandfather   . Liver cancer Neg Hx    Social History   Socioeconomic History  . Marital status: Widowed    Spouse name: Not on file  . Number of children: Not on file  . Years of education: Not on file  . Highest education level: Not on file  Occupational History  . Occupation: Disabled  Tobacco Use  . Smoking status: Never Smoker  . Smokeless tobacco: Never Used  Substance  and Sexual Activity  . Alcohol use: Not Currently    Alcohol/week: 0.0 standard drinks  . Drug use: No  . Sexual activity: Not on file  Other Topics Concern  . Not on file  Social History Narrative   Widowed 1997   Lives alone   Lost a son in his arms in 1978.    3 sons   Social Determinants of Health   Financial Resource Strain: Low Risk   . Difficulty of Paying Living Expenses: Not hard at all  Food Insecurity: No Food Insecurity  . Worried About Charity fundraiser in the Last Year: Never true  . Ran Out of Food in the Last Year: Never true  Transportation Needs: No Transportation Needs  . Lack of Transportation (Medical): No  . Lack of Transportation (Non-Medical): No  Physical Activity: Not on file  Stress: No Stress Concern Present  . Feeling of Stress : Not at all  Social Connections: Unknown  . Frequency of Communication with Friends and Family: More than three times a week  . Frequency of Social Gatherings with Friends and Family: More than three times a week  . Attends Religious Services: Not on file  . Active Member of Clubs or Organizations: Not on file  . Attends Archivist Meetings: Not on file  . Marital Status: Widowed    Tobacco Counseling Counseling given: Not Answered   Clinical Intake:  Pre-visit preparation completed: Yes        Diabetes: No  How often do you need to have someone help you when you read instructions, pamphlets, or other written materials from your doctor or pharmacy?: 1 - Never    Interpreter Needed?: No      Activities of Daily Living In your present state of health, do you have any difficulty performing the following activities: 08/08/2020  Hearing? N  Vision? N  Difficulty concentrating or making decisions? N  Walking or climbing stairs? Y  Comment Wheelchair/crutches in use when ambulating.  Dressing or bathing? Y  Comment Aide assist  Doing errands, shopping? N  Preparing Food and eating ? Y   Comment Aide assist with meal prep. Self feed.  Using the Toilet? N  In the past six months, have you accidently leaked urine? N  Do you have problems with loss of bowel control? N  Managing your Medications? N  Managing your Finances? N  Housekeeping or managing your Housekeeping? Y  Comment Aide assist  Some recent data might be hidden    Patient Care Team: [name], FNP as PCP - General (Family Medicine) Sable Feil, MD as Attending Physician (Gastroenterology) Shanon Ace, MD as Referring Physician (Anesthesiology) Jari Pigg, MD as Attending Physician (Dermatology) Mahalia Longest (Internal Medicine) Norma Fredrickson, MD as Consulting Physician (Psychiatry)  Indicate any recent  Medical Services you may have received from other than [facility] providers in the past year (date may be approximate).     Assessment:   This is a routine wellness examination for [name].  I connected with [name] today by telephone and verified that I am speaking with the correct person using two identifiers. Location patient: home Location provider: work Persons participating in the virtual visit: patient, Marine scientist.    I discussed the limitations, risks, security and privacy concerns of performing an evaluation and management service by telephone and the availability of in person appointments. The patient expressed understanding and verbally consented to this telephonic visit.    Interactive audio and video telecommunications were attempted between this provider and patient, however failed, due to patient having technical difficulties OR patient did not have access to video capability.  We continued and completed visit with audio only.  Some vital signs may be absent or patient reported.   Hearing/Vision screen  Hearing Screening   125Hz  250Hz  500Hz  1000Hz  2000Hz  3000Hz  4000Hz  6000Hz  8000Hz   Right ear:           Left ear:           Comments: Patient is able to hear  conversational tones without difficulty. No issues reported.  Vision Screening Comments: Wears corrective lenses. Visual acuity not assessed, virtual visit.   Dietary issues and exercise activities discussed: Current Exercise Habits: The patient does not participate in regular exercise at present   Healthy diet Good water intake   Goals      Patient Stated   .  Follow up with Primary Care Provider (pt-stated)      Depression Screen PHQ 2/9 Scores 08/08/2020 08/04/2019 06/15/2019  PHQ - 2 Score 0 0 0    Fall Risk Fall Risk  08/08/2020 08/04/2019 06/15/2019  Falls in the past year? 0 0 0  Comment - none since last reported -  Number falls in past yr: 0 - 0  Risk for fall due to : History of fall(s) Impaired balance/gait -  Follow up Falls evaluation completed Falls prevention discussed Falls evaluation completed    FALL RISK PREVENTION PERTAINING TO THE HOME: Handrails in use when climbing stairs?Yes Home free of loose throw rugs in walkways, pet beds, electrical cords, etc? Yes  Adequate lighting in your home to reduce risk of falls? Yes   ASSISTIVE DEVICES UTILIZED TO PREVENT FALLS: Life alert? No  Use of a cane, walker or w/c? Yes   Grab bars in the bathroom? Yes  Shower chair or bench in shower? Yes  Elevated toilet seat or a handicapped toilet? Yes   TIMED UP AND GO: Was the test performed? No . Virtual visit.  Cognitive Function:     6CIT Screen 08/08/2020 08/04/2019  What Year? 0 points 0 points  What month? 0 points 0 points  What time? 0 points 0 points  Count back from 20 0 points 0 points  Months in reverse 0 points 0 points  Repeat phrase - 0 points  Total Score - 0    Immunizations Immunization History  Administered Date(s) Administered  . Fluad Quad(high Dose 65+) 08/05/2020  . Influenza Split 05/21/2012  . Influenza Whole 09/12/2009  . Influenza, High Dose Seasonal PF 05/22/2016, 10/03/2018  . Influenza, Seasonal, Injecte, Preservative Fre  05/06/2013  . Influenza-Unspecified 05/17/2011, 04/29/2013  . PFIZER SARS-COV-2 Vaccination 09/21/2019, 10/12/2019, 07/16/2020  . Pneumococcal Conjugate-13 01/06/2015  . Pneumococcal Polysaccharide-23 08/18/2014  . Pneumococcal-Unspecified 10/20/2009    TDAP status: Due,  Education has been provided regarding the importance of this vaccine. Advised may receive this vaccine at local pharmacy or Health Dept. Aware to provide a copy of the vaccination record if obtained from local pharmacy or Health Dept. Verbalized acceptance and understanding. Deferred.   Health Maintenance Health Maintenance  Topic Date Due  . COLONOSCOPY (Pts 45-70yr Insurance coverage will need to be confirmed)  08/08/2021 (Originally 05/12/2005)  . TETANUS/TDAP  08/08/2021 (Originally [birth year]/1968)  . INFLUENZA VACCINE  Completed  . COVID-19 Vaccine  Completed  . PNA vac Low Risk Adult  Completed  . Hepatitis C Screening  Discontinued   Colonoscopy- Deferred per patient preference.   Lung Cancer Screening: (Low Dose CT Chest recommended if Age 72-80years, 30 pack-year currently smoking OR have quit w/in 15years.) does not qualify.   Hepatitis C Screening: previously discontinued.   Vision Screening: Recommended annual ophthalmology exams for early detection of glaucoma and other disorders of the eye. Is the patient up to date with their annual eye exam?  Yes  Who is the provider or what is the name of the office in which the patient attends annual eye exams? [facility]  Dental Screening: Recommended annual dental exams for proper oral hygiene.  Community Resource Referral / Chronic Care Management: CRR required this visit?  No   CCM required this visit?  No      Plan:   Keep all routine maintenance appointments.   I have personally reviewed and noted the following in the patient's chart:   . Medical and social history . Use of alcohol, tobacco or illicit drugs  . Current medications and  supplements . Functional ability and status . Nutritional status . Physical activity . Advanced directives . List of other physicians . Hospitalizations, surgeries, and ER visits in previous 12 months . Vitals . Screenings to include cognitive, depression, and falls . Referrals and appointments  In addition, I have reviewed and discussed with patient certain preventive protocols, quality metrics, and best practice recommendations. A written personalized care plan for preventive services as well as general preventive health recommendations were provided to patient via mychart.     OVarney Biles LPN   17/67/2094   Agree with plan. MMable Paris NP

## 2020-08-08 NOTE — Patient Instructions (Addendum)
Mr. [name] , Thank you for taking time to come for your Medicare Wellness Visit. I appreciate your ongoing commitment to your health goals. Please review the following plan we discussed and let me know if I can assist you in the future.   These are the goals we discussed: Goals      Patient Stated   .  Follow up with Primary Care Provider (pt-stated)       This is a list of the screening recommended for you and due dates:  Health Maintenance  Topic Date Due  . Colon Cancer Screening  08/08/2021*  . Tetanus Vaccine  08/08/2021*  . Flu Shot  Completed  . COVID-19 Vaccine  Completed  . Pneumonia vaccines  Completed  .  Hepatitis C: One time screening is recommended by Center for Disease Control  (CDC) for  adults born from [birth year] through 1967.   Discontinued  *Topic was postponed. The date shown is not the original due date.    Immunizations Immunization History  Administered Date(s) Administered  . Fluad Quad(high Dose 72+) 08/05/2020  . Influenza Split 05/21/2012  . Influenza Whole 09/12/2009  . Influenza, High Dose Seasonal PF 05/22/2016, 10/03/2018  . Influenza, Seasonal, Injecte, Preservative Fre 05/06/2013  . Influenza-Unspecified 05/17/2011, 04/29/2013  . PFIZER SARS-COV-2 Vaccination 09/21/2019, 10/12/2019, 07/16/2020  . Pneumococcal Conjugate-13 01/06/2015  . Pneumococcal Polysaccharide-23 08/18/2014  . Pneumococcal-Unspecified 10/20/2009   Advanced directives: on file  Conditions/risks identified: none new  Follow up in one year for your annual wellness visit.   Preventive Care 63 Years and Older, Male Preventive care refers to lifestyle choices and visits with your health care provider that can promote health and wellness. What does preventive care include?  A yearly physical exam. This is also called an annual well check.  Dental exams once or twice a year.  Routine eye exams. Ask your health care provider how often you should have your eyes  checked.  Personal lifestyle choices, including:  Daily care of your teeth and gums.  Regular physical activity.  Eating a healthy diet.  Avoiding tobacco and drug use.  Limiting alcohol use.  Practicing safe sex.  Taking low doses of aspirin every day.  Taking vitamin and mineral supplements as recommended by your health care provider. What happens during an annual well check? The services and screenings done by your health care provider during your annual well check will depend on your age, overall health, lifestyle risk factors, and family history of disease. Counseling  Your health care provider may ask you questions about your:  Alcohol use.  Tobacco use.  Drug use.  Emotional well-being.  Home and relationship well-being.  Sexual activity.  Eating habits.  History of falls.  Memory and ability to understand (cognition).  Work and work Statistician. Screening  You may have the following tests or measurements:  Height, weight, and BMI.  Blood pressure.  Lipid and cholesterol levels. These may be checked every 5 years, or more frequently if you are over 54 years old.  Skin check.  Lung cancer screening. You may have this screening every year starting at age 39 if you have a 30-pack-year history of smoking and currently smoke or have quit within the past 15 years.  Fecal occult blood test (FOBT) of the stool. You may have this test every year starting at age 55.  Flexible sigmoidoscopy or colonoscopy. You may have a sigmoidoscopy every 5 years or a colonoscopy every 10 years starting at age 87.  Prostate  cancer screening. Recommendations will vary depending on your family history and other risks.  Hepatitis C blood test.  Hepatitis B blood test.  Sexually transmitted disease (STD) testing.  Diabetes screening. This is done by checking your blood sugar (glucose) after you have not eaten for a while (fasting). You may have this done every 1-3  years.  Abdominal aortic aneurysm (AAA) screening. You may need this if you are a current or former smoker.  Osteoporosis. You may be screened starting at age 42 if you are at high risk. Talk with your health care provider about your test results, treatment options, and if necessary, the need for more tests. Vaccines  Your health care provider may recommend certain vaccines, such as:  Influenza vaccine. This is recommended every year.  Tetanus, diphtheria, and acellular pertussis (Tdap, Td) vaccine. You may need a Td booster every 10 years.  Zoster vaccine. You may need this after age 75.  Pneumococcal 13-valent conjugate (PCV13) vaccine. One dose is recommended after age 65.  Pneumococcal polysaccharide (PPSV23) vaccine. One dose is recommended after age 1. Talk to your health care provider about which screenings and vaccines you need and how often you need them. This information is not intended to replace advice given to you by your health care provider. Make sure you discuss any questions you have with your health care provider. Document Released: 08/12/2015 Document Revised: 04/04/2016 Document Reviewed: 05/17/2015 Elsevier Interactive Patient Education  2017 [place] Prevention in the Home Falls can cause injuries. They can happen to people of all ages. There are many things you can do to make your home safe and to help prevent falls. What can I do on the outside of my home?  Regularly fix the edges of walkways and driveways and fix any cracks.  Remove anything that might make you trip as you walk through a door, such as a raised step or threshold.  Trim any bushes or trees on the path to your home.  Use bright outdoor lighting.  Clear any walking paths of anything that might make someone trip, such as rocks or tools.  Regularly check to see if handrails are loose or broken. Make sure that both sides of any steps have handrails.  Any raised decks and porches  should have guardrails on the edges.  Have any leaves, snow, or ice cleared regularly.  Use sand or salt on walking paths during winter.  Clean up any spills in your garage right away. This includes oil or grease spills. What can I do in the bathroom?  Use night lights.  Install grab bars by the toilet and in the tub and shower. Do not use towel bars as grab bars.  Use non-skid mats or decals in the tub or shower.  If you need to sit down in the shower, use a plastic, non-slip stool.  Keep the floor dry. Clean up any water that spills on the floor as soon as it happens.  Remove soap buildup in the tub or shower regularly.  Attach bath mats securely with double-sided non-slip rug tape.  Do not have throw rugs and other things on the floor that can make you trip. What can I do in the bedroom?  Use night lights.  Make sure that you have a light by your bed that is easy to reach.  Do not use any sheets or blankets that are too big for your bed. They should not hang down onto the floor.  Have a  firm chair that has side arms. You can use this for support while you get dressed.  Do not have throw rugs and other things on the floor that can make you trip. What can I do in the kitchen?  Clean up any spills right away.  Avoid walking on wet floors.  Keep items that you use a lot in easy-to-reach places.  If you need to reach something above you, use a strong step stool that has a grab bar.  Keep electrical cords out of the way.  Do not use floor polish or wax that makes floors slippery. If you must use wax, use non-skid floor wax.  Do not have throw rugs and other things on the floor that can make you trip. What can I do with my stairs?  Do not leave any items on the stairs.  Make sure that there are handrails on both sides of the stairs and use them. Fix handrails that are broken or loose. Make sure that handrails are as long as the stairways.  Check any carpeting to  make sure that it is firmly attached to the stairs. Fix any carpet that is loose or worn.  Avoid having throw rugs at the top or bottom of the stairs. If you do have throw rugs, attach them to the floor with carpet tape.  Make sure that you have a light switch at the top of the stairs and the bottom of the stairs. If you do not have them, ask someone to add them for you. What else can I do to help prevent falls?  Wear shoes that:  Do not have high heels.  Have rubber bottoms.  Are comfortable and fit you well.  Are closed at the toe. Do not wear sandals.  If you use a stepladder:  Make sure that it is fully opened. Do not climb a closed stepladder.  Make sure that both sides of the stepladder are locked into place.  Ask someone to hold it for you, if possible.  Clearly mark and make sure that you can see:  Any grab bars or handrails.  First and last steps.  Where the edge of each step is.  Use tools that help you move around (mobility aids) if they are needed. These include:  Canes.  Walkers.  Scooters.  Crutches.  Turn on the lights when you go into a dark area. Replace any light bulbs as soon as they burn out.  Set up your furniture so you have a clear path. Avoid moving your furniture around.  If any of your floors are uneven, fix them.  If there are any pets around you, be aware of where they are.  Review your medicines with your doctor. Some medicines can make you feel dizzy. This can increase your chance of falling. Ask your doctor what other things that you can do to help prevent falls. This information is not intended to replace advice given to you by your health care provider. Make sure you discuss any questions you have with your health care provider. Document Released: 05/12/2009 Document Revised: 12/22/2015 Document Reviewed: 08/20/2014 Elsevier Interactive Patient Education  2017 [employer].

## 2020-08-19 IMAGING — CT CT HEAD W/O CM
3 series · 15 of 47 positions shown, 18 images · non-contrast
Comparison: CT head 04/07/2018

CLINICAL DATA: Altered mental status

EXAM:
CT HEAD WITHOUT CONTRAST
TECHNIQUE: Contiguous axial images were obtained from the base of the skull
through the vertex without intravenous contrast.

[Series 2: head wo · axial · 0.45mm/px · z∈[+401,+541]mm · 9 of 34 slices shown, 12 images]
[im 3/34  brain]
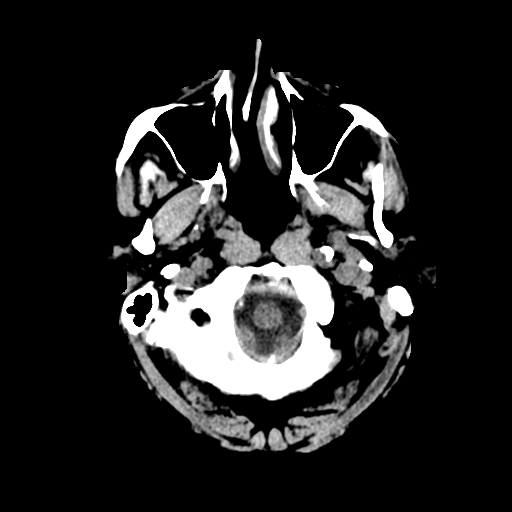
[im 3/34  bone]
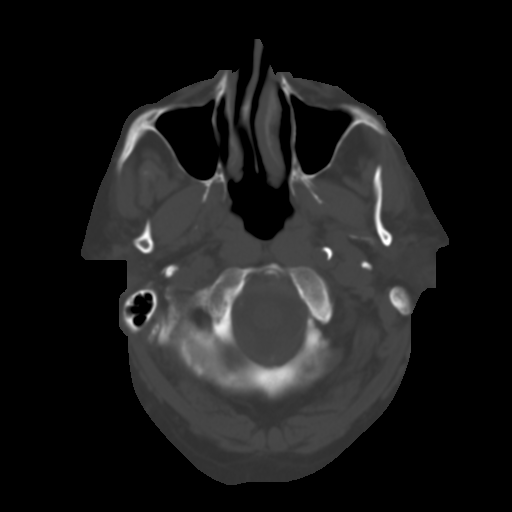
[im 6/34  brain]
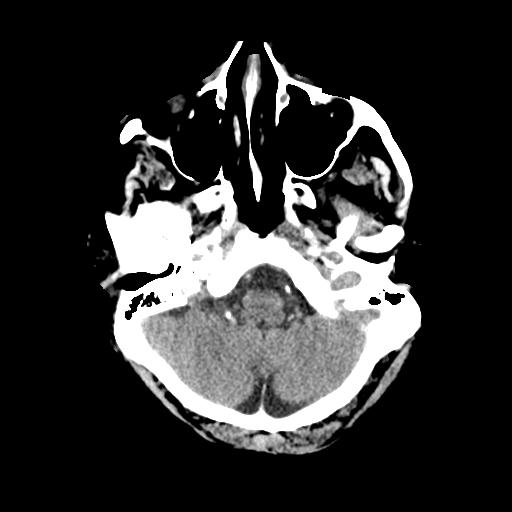
[im 10/34  brain]
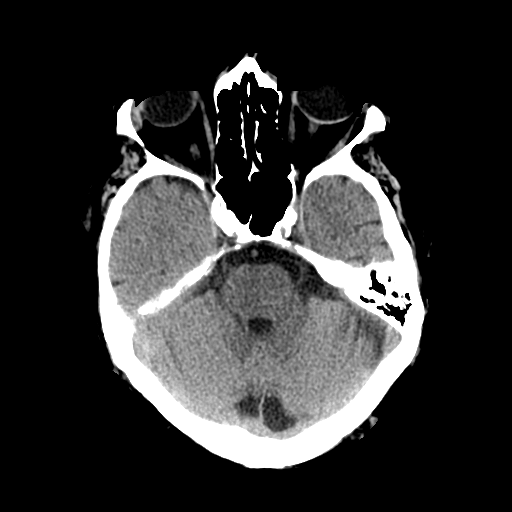
[im 13/34  brain]
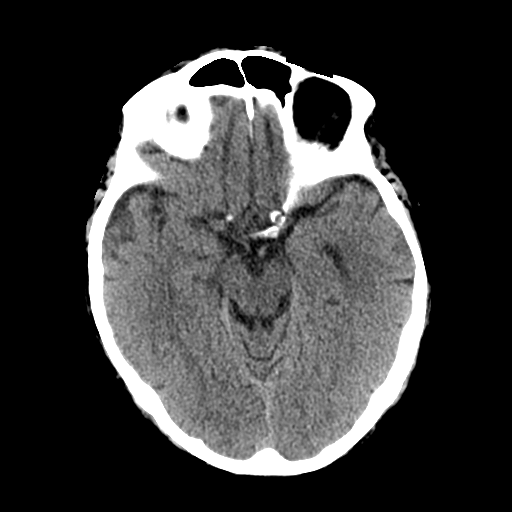
[im 18/34  brain]
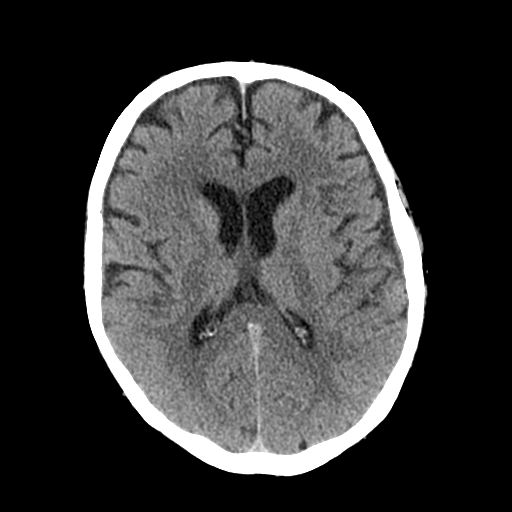
[im 18/34  bone]
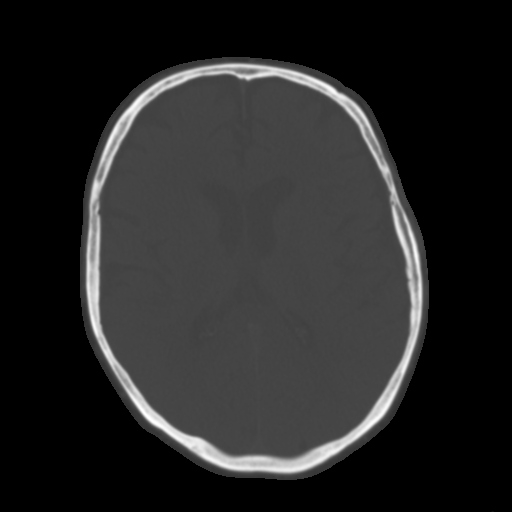
[im 21/34  brain]
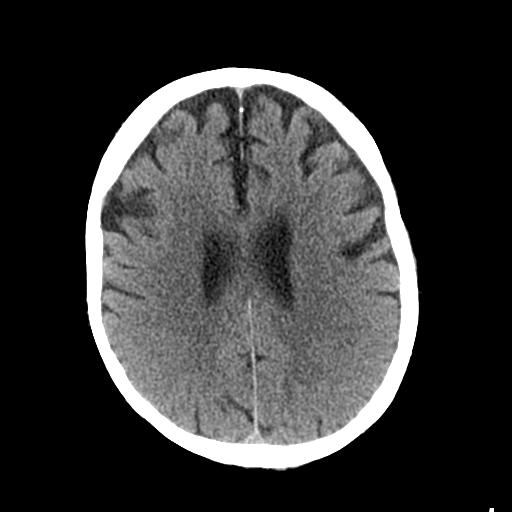
[im 24/34  brain]
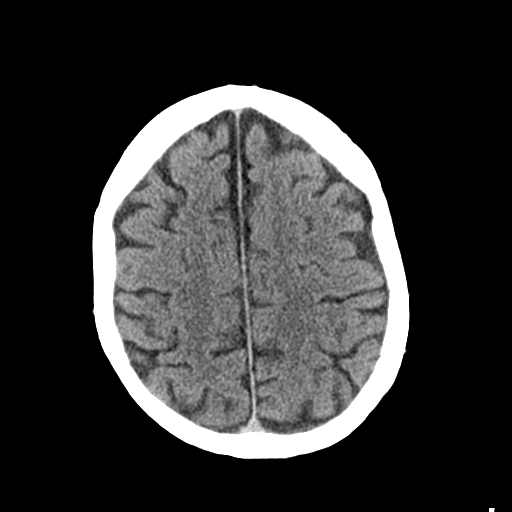
[im 28/34  brain]
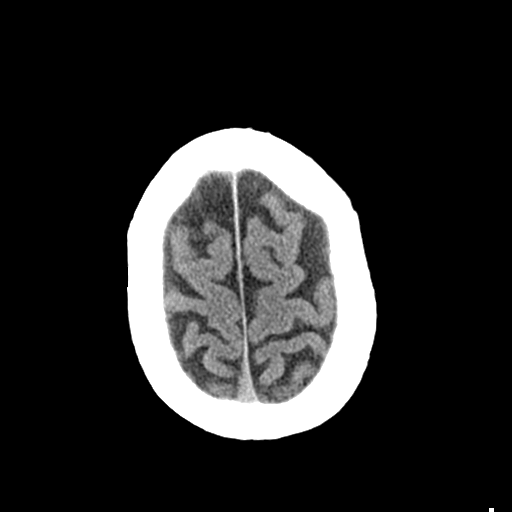
[im 31/34  brain]
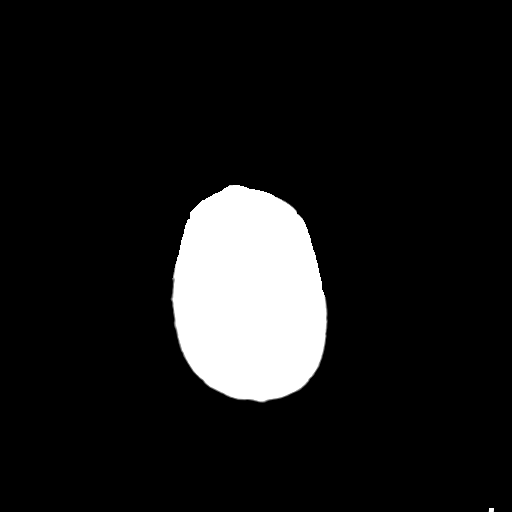
[im 31/34  bone]
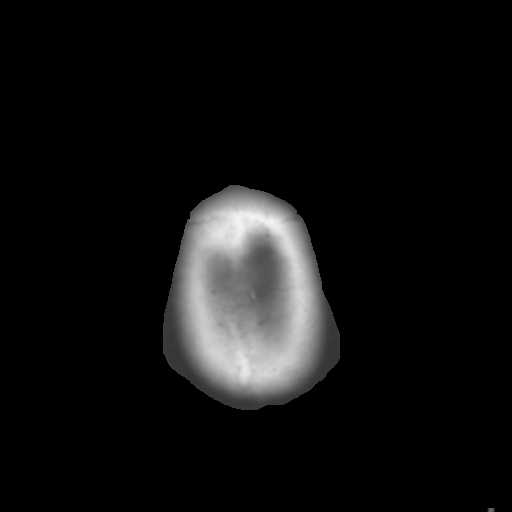

[Series 4: coronal soft tissue · coronal · 0.32mm/px · 3 of 65 slices shown]
[im 22/65  brain]
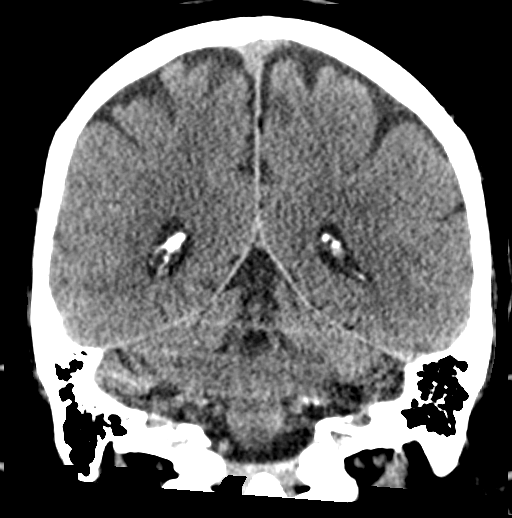
[im 29/65  brain]
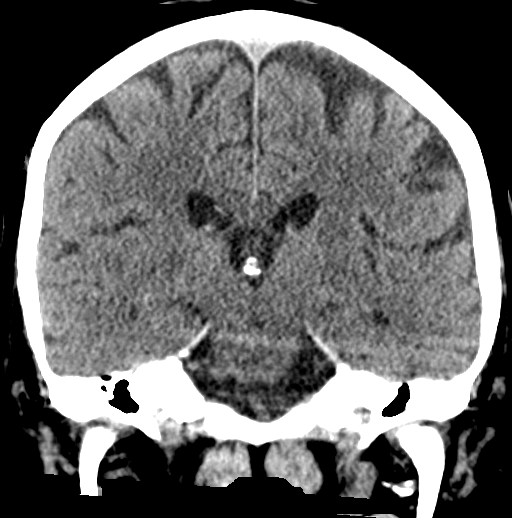
[im 36/65  brain]
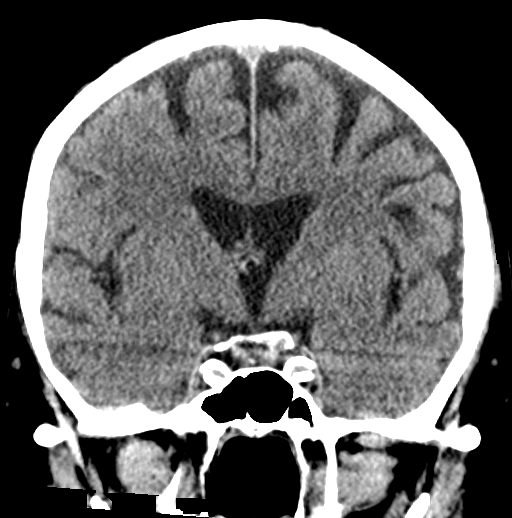

[Series 5: sagittal soft tissue · sagittal · 0.32mm/px · 3 of 52 slices shown]
[im 18/52  brain]
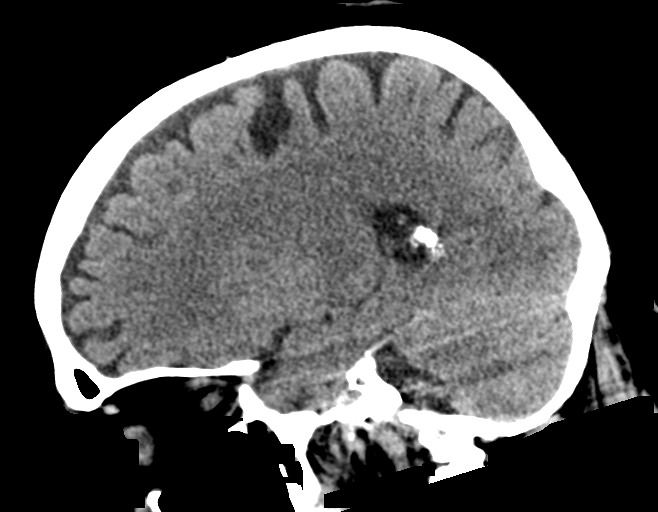
[im 26/52  brain]
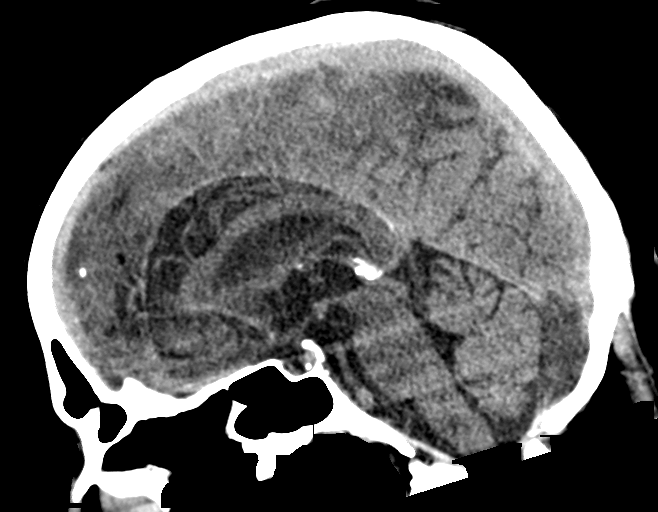
[im 35/52  brain]
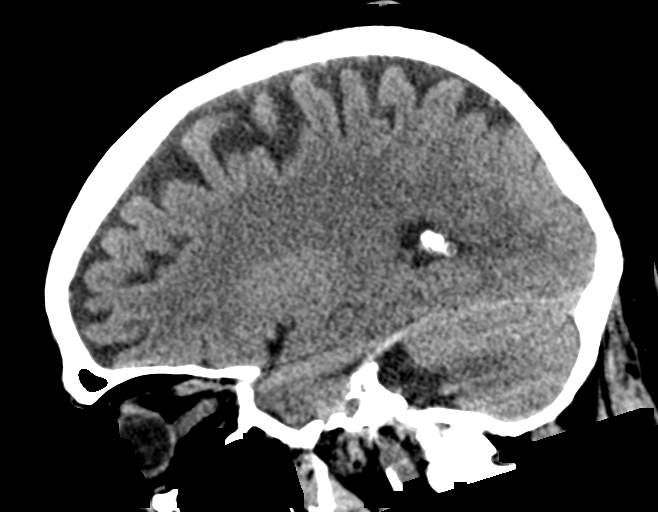

[15 of 47 positions shown; findings below may reference images not displayed]

FINDINGS: Brain: No evidence of acute infarction, hemorrhage, hydrocephalus,
extra-axial collection or mass lesion/mass effect. Symmetric
prominence of the ventricles, cisterns and sulci compatible with
parenchymal volume loss. Patchy areas of white matter
hypoattenuation are most compatible with chronic microvascular
angiopathy.

Vascular: Atherosclerotic calcification of the carotid siphons and
intradural vertebral arteries. No hyperdense vessel.

Skull: No calvarial fracture or suspicious osseous lesion. No scalp
swelling or hematoma.

Sinuses/Orbits: Paranasal sinuses and mastoid air cells are
predominantly clear. Debris in the left external auditory canal,
likely cerumen impaction. Prior left lens extraction. Orbits are
otherwise unremarkable.

Other: None
IMPRESSION: 1. No acute intracranial abnormality. No acute orbital abnormality.
Prior left lens extraction.
2. Brain atrophy and chronic microvascular angiopathy.

## 2020-08-22 ENCOUNTER — Other Ambulatory Visit: Payer: Self-pay | Admitting: Endocrinology

## 2020-08-22 DIAGNOSIS — E039 Hypothyroidism, unspecified: Secondary | ICD-10-CM

## 2020-08-26 ENCOUNTER — Other Ambulatory Visit: Payer: Self-pay | Admitting: Endocrinology

## 2020-08-26 DIAGNOSIS — E039 Hypothyroidism, unspecified: Secondary | ICD-10-CM

## 2020-10-07 ENCOUNTER — Encounter: Payer: Self-pay | Admitting: Oncology

## 2020-10-07 ENCOUNTER — Inpatient Hospital Stay: Payer: Medicare Other | Admitting: Oncology

## 2020-10-07 ENCOUNTER — Inpatient Hospital Stay: Payer: Medicare Other | Attending: Oncology

## 2020-11-03 ENCOUNTER — Telehealth: Payer: Self-pay | Admitting: Oncology

## 2020-11-03 NOTE — Telephone Encounter (Signed)
Left VM with patient to reschedule missed appts on 10/07/20. Requested a call back.

## 2020-11-15 ENCOUNTER — Other Ambulatory Visit: Payer: Self-pay | Admitting: Family

## 2020-11-15 DIAGNOSIS — I10 Essential (primary) hypertension: Secondary | ICD-10-CM

## 2020-11-15 NOTE — Telephone Encounter (Signed)
Please advise to refill looks like was DC by oncology due to labs in September 2021. No labs reported since that date .

## 2020-11-16 ENCOUNTER — Telehealth: Payer: Self-pay | Admitting: Family

## 2020-11-16 NOTE — Telephone Encounter (Signed)
Call pt rec'ed refill request for hctz. I declined.   I wasn't aware he was on this medication  At last visit, he was on losartan 100mg , propranolol 40mg  BID  Please sch IN person f/u for BP management

## 2020-11-17 NOTE — Telephone Encounter (Signed)
LMTCB

## 2020-11-22 NOTE — Telephone Encounter (Signed)
Pt stated that he is taking the propanolol 40mg  BID & the Losartan 100mg  every day as well as HCTZ. He is scheduled 12/09/20 to come into office. He may need to have help getting to building in wheelchair. As of now wheelchair bound due to his left leg giving him trouble & right being amputated. He will call when he is in route to office bc he is only about 10 minutes away.

## 2020-11-23 ENCOUNTER — Other Ambulatory Visit: Payer: Self-pay

## 2020-11-23 ENCOUNTER — Other Ambulatory Visit: Payer: Self-pay | Admitting: Family

## 2020-11-23 MED ORDER — HYDROCHLOROTHIAZIDE 12.5 MG PO TABS: 12.5000 mg | ORAL_TABLET | Freq: Every day | ORAL | 1 refills | Status: DC

## 2020-11-23 NOTE — Telephone Encounter (Signed)
Pt called and I have sent 12.5mg  HCTZ to [facility]. He will come in person & will be early so we can get patient in if needed in his wheelchair.

## 2020-11-23 NOTE — Telephone Encounter (Signed)
Call pt  Please refill hctz at prior dose  Ensure f/u schedule reiterate importance of in person so we can check BP and check electrolytes, renal function to ensure safe to be on medications.

## 2020-12-02 ENCOUNTER — Other Ambulatory Visit: Payer: Self-pay | Admitting: Family

## 2020-12-02 DIAGNOSIS — I10 Essential (primary) hypertension: Secondary | ICD-10-CM

## 2020-12-09 ENCOUNTER — Ambulatory Visit: Payer: Medicare Other | Admitting: Family

## 2020-12-13 ENCOUNTER — Telehealth: Payer: Self-pay | Admitting: Family

## 2020-12-13 NOTE — Telephone Encounter (Signed)
PT called in and requested a appointment for infection in left leg and hip. Provided appointment for May 20 at 10:30 am.

## 2020-12-13 NOTE — Telephone Encounter (Signed)
If patient has drainage  and pain, he may need to be seen this evening for evaluation and labs at emergency room/ Urgent care.

## 2020-12-13 NOTE — Telephone Encounter (Signed)
Patient has to have caregiver bring him to appointments & would be better for him to be seen in the morning when is available to bring him.

## 2020-12-13 NOTE — Telephone Encounter (Signed)
FYI I called patient & he stated that it was not his hip that was possibly infected, but the left knee & only knee ( patient amputee). He said that he saw some pus come out where he had an injection at pain management. I asked if he had let them know & he stated that he hadn't. I asked that he please call them to tell them this as well. Patient started that knee was red, painful & somewhat swollen. He is concerned & told him that I did not feel the need to send him to UC at 4:30p, but that I wanted him seen sooner than Friday. Patient is scheduled for tomorrow with Sharyn Lull at 9:30. He apologized for his no show last week & his caregiver was sick. He is now wheelchair bound & needed her to help get him here. I told & stressed the importance of him being seen tomorrow.

## 2020-12-13 NOTE — Telephone Encounter (Signed)
Unable to leave message for patient to return call back. Phone kept ringing. Pt has appointment in the am @0930 

## 2020-12-14 ENCOUNTER — Emergency Department: Payer: Medicare Other

## 2020-12-14 ENCOUNTER — Inpatient Hospital Stay
Admission: EM | Admit: 2020-12-14 | Discharge: 2020-12-18 | DRG: 603 | Disposition: A | Discharge status: Home Health | Payer: Medicare Other | Attending: Internal Medicine | Admitting: Internal Medicine

## 2020-12-14 ENCOUNTER — Ambulatory Visit (INDEPENDENT_AMBULATORY_CARE_PROVIDER_SITE_OTHER): Payer: Medicare Other | Admitting: Adult Health

## 2020-12-14 ENCOUNTER — Encounter: Payer: Self-pay | Admitting: Adult Health

## 2020-12-14 ENCOUNTER — Telehealth: Payer: Self-pay

## 2020-12-14 ENCOUNTER — Other Ambulatory Visit: Payer: Self-pay

## 2020-12-14 VITALS — BP 120/50 | HR 76 | Temp 97.5°F | Ht 72.99 in

## 2020-12-14 DIAGNOSIS — E878 Other disorders of electrolyte and fluid balance, not elsewhere classified: Secondary | ICD-10-CM | POA: Diagnosis not present

## 2020-12-14 DIAGNOSIS — R579 Shock, unspecified: Secondary | ICD-10-CM | POA: Diagnosis not present

## 2020-12-14 DIAGNOSIS — Z79899 Other long term (current) drug therapy: Secondary | ICD-10-CM

## 2020-12-14 DIAGNOSIS — M7989 Other specified soft tissue disorders: Secondary | ICD-10-CM | POA: Insufficient documentation

## 2020-12-14 DIAGNOSIS — K7031 Alcoholic cirrhosis of liver with ascites: Secondary | ICD-10-CM | POA: Diagnosis not present

## 2020-12-14 DIAGNOSIS — Z89619 Acquired absence of unspecified leg above knee: Secondary | ICD-10-CM

## 2020-12-14 DIAGNOSIS — L03116 Cellulitis of left lower limb: Principal | ICD-10-CM

## 2020-12-14 DIAGNOSIS — R5381 Other malaise: Secondary | ICD-10-CM | POA: Diagnosis present

## 2020-12-14 DIAGNOSIS — Z85828 Personal history of other malignant neoplasm of skin: Secondary | ICD-10-CM

## 2020-12-14 DIAGNOSIS — E039 Hypothyroidism, unspecified: Secondary | ICD-10-CM | POA: Diagnosis present

## 2020-12-14 DIAGNOSIS — K648 Other hemorrhoids: Secondary | ICD-10-CM | POA: Diagnosis present

## 2020-12-14 DIAGNOSIS — G894 Chronic pain syndrome: Secondary | ICD-10-CM | POA: Diagnosis present

## 2020-12-14 DIAGNOSIS — N179 Acute kidney failure, unspecified: Secondary | ICD-10-CM | POA: Diagnosis not present

## 2020-12-14 DIAGNOSIS — B182 Chronic viral hepatitis C: Secondary | ICD-10-CM | POA: Diagnosis present

## 2020-12-14 DIAGNOSIS — K8042 Calculus of bile duct with acute cholecystitis without obstruction: Secondary | ICD-10-CM | POA: Diagnosis not present

## 2020-12-14 DIAGNOSIS — I10 Essential (primary) hypertension: Secondary | ICD-10-CM | POA: Diagnosis present

## 2020-12-14 DIAGNOSIS — D62 Acute posthemorrhagic anemia: Secondary | ICD-10-CM

## 2020-12-14 DIAGNOSIS — E785 Hyperlipidemia, unspecified: Secondary | ICD-10-CM | POA: Diagnosis present

## 2020-12-14 DIAGNOSIS — R21 Rash and other nonspecific skin eruption: Secondary | ICD-10-CM | POA: Diagnosis present

## 2020-12-14 DIAGNOSIS — A419 Sepsis, unspecified organism: Secondary | ICD-10-CM | POA: Diagnosis not present

## 2020-12-14 DIAGNOSIS — K7460 Unspecified cirrhosis of liver: Secondary | ICD-10-CM | POA: Diagnosis present

## 2020-12-14 DIAGNOSIS — K635 Polyp of colon: Secondary | ICD-10-CM | POA: Diagnosis not present

## 2020-12-14 DIAGNOSIS — E871 Hypo-osmolality and hyponatremia: Secondary | ICD-10-CM | POA: Diagnosis present

## 2020-12-14 DIAGNOSIS — D61818 Other pancytopenia: Secondary | ICD-10-CM | POA: Diagnosis present

## 2020-12-14 DIAGNOSIS — Z801 Family history of malignant neoplasm of trachea, bronchus and lung: Secondary | ICD-10-CM

## 2020-12-14 DIAGNOSIS — R778 Other specified abnormalities of plasma proteins: Secondary | ICD-10-CM | POA: Diagnosis not present

## 2020-12-14 DIAGNOSIS — D649 Anemia, unspecified: Secondary | ICD-10-CM

## 2020-12-14 DIAGNOSIS — K819 Cholecystitis, unspecified: Secondary | ICD-10-CM | POA: Diagnosis not present

## 2020-12-14 DIAGNOSIS — F419 Anxiety disorder, unspecified: Secondary | ICD-10-CM | POA: Diagnosis present

## 2020-12-14 DIAGNOSIS — D589 Hereditary hemolytic anemia, unspecified: Secondary | ICD-10-CM

## 2020-12-14 DIAGNOSIS — D594 Other nonautoimmune hemolytic anemias: Secondary | ICD-10-CM | POA: Diagnosis not present

## 2020-12-14 DIAGNOSIS — Z79890 Hormone replacement therapy (postmenopausal): Secondary | ICD-10-CM

## 2020-12-14 DIAGNOSIS — E875 Hyperkalemia: Secondary | ICD-10-CM | POA: Diagnosis not present

## 2020-12-14 DIAGNOSIS — Z87442 Personal history of urinary calculi: Secondary | ICD-10-CM

## 2020-12-14 DIAGNOSIS — Z89611 Acquired absence of right leg above knee: Secondary | ICD-10-CM

## 2020-12-14 DIAGNOSIS — D6959 Other secondary thrombocytopenia: Secondary | ICD-10-CM | POA: Diagnosis present

## 2020-12-14 DIAGNOSIS — Z20822 Contact with and (suspected) exposure to covid-19: Secondary | ICD-10-CM | POA: Diagnosis present

## 2020-12-14 DIAGNOSIS — K5730 Diverticulosis of large intestine without perforation or abscess without bleeding: Secondary | ICD-10-CM | POA: Diagnosis present

## 2020-12-14 DIAGNOSIS — Z8249 Family history of ischemic heart disease and other diseases of the circulatory system: Secondary | ICD-10-CM

## 2020-12-14 DIAGNOSIS — L270 Generalized skin eruption due to drugs and medicaments taken internally: Secondary | ICD-10-CM | POA: Diagnosis not present

## 2020-12-14 DIAGNOSIS — K3189 Other diseases of stomach and duodenum: Secondary | ICD-10-CM | POA: Diagnosis not present

## 2020-12-14 DIAGNOSIS — K7030 Alcoholic cirrhosis of liver without ascites: Secondary | ICD-10-CM | POA: Diagnosis not present

## 2020-12-14 DIAGNOSIS — Z515 Encounter for palliative care: Secondary | ICD-10-CM | POA: Diagnosis not present

## 2020-12-14 DIAGNOSIS — D696 Thrombocytopenia, unspecified: Secondary | ICD-10-CM | POA: Diagnosis not present

## 2020-12-14 DIAGNOSIS — Z7189 Other specified counseling: Secondary | ICD-10-CM | POA: Diagnosis not present

## 2020-12-14 DIAGNOSIS — R6521 Severe sepsis with septic shock: Secondary | ICD-10-CM | POA: Diagnosis not present

## 2020-12-14 DIAGNOSIS — T360X5A Adverse effect of penicillins, initial encounter: Secondary | ICD-10-CM | POA: Diagnosis not present

## 2020-12-14 DIAGNOSIS — L309 Dermatitis, unspecified: Secondary | ICD-10-CM | POA: Diagnosis present

## 2020-12-14 DIAGNOSIS — G9341 Metabolic encephalopathy: Secondary | ICD-10-CM | POA: Diagnosis not present

## 2020-12-14 DIAGNOSIS — K766 Portal hypertension: Secondary | ICD-10-CM | POA: Diagnosis present

## 2020-12-14 DIAGNOSIS — I864 Gastric varices: Secondary | ICD-10-CM | POA: Diagnosis present

## 2020-12-14 DIAGNOSIS — R932 Abnormal findings on diagnostic imaging of liver and biliary tract: Secondary | ICD-10-CM | POA: Diagnosis not present

## 2020-12-14 LAB — HEMOGLOBIN AND HEMATOCRIT, BLOOD
HCT: 20.4 % — ABNORMAL LOW (ref 39.0–52.0)
Hemoglobin: 6.7 g/dL — ABNORMAL LOW (ref 13.0–17.0)

## 2020-12-14 LAB — BASIC METABOLIC PANEL
Anion gap: 11 (ref 5–15)
BUN: 18 mg/dL (ref 8–23)
CO2: 26 mmol/L (ref 22–32)
Calcium: 9.5 mg/dL (ref 8.9–10.3)
Chloride: 100 mmol/L (ref 98–111)
Creatinine, Ser: 0.7 mg/dL (ref 0.61–1.24)
GFR, Estimated: >60 mL/min (ref >60)
Glucose, Bld: 114 mg/dL — ABNORMAL HIGH (ref 70–99)
Potassium: 4.4 mmol/L (ref 3.5–5.1)
Sodium: 137 mmol/L (ref 135–145)

## 2020-12-14 LAB — CBC
HCT: 19.2 % — ABNORMAL LOW (ref 39.0–52.0)
Hemoglobin: 6.1 g/dL — ABNORMAL LOW (ref 13.0–17.0)
MCH: 31.8 pg (ref 26.0–34.0)
MCHC: 31.8 g/dL (ref 30.0–36.0)
MCV: 100 fL (ref 80.0–100.0)
Platelets: 42 10*3/uL — ABNORMAL LOW (ref 150–400)
RBC: 1.92 MIL/uL — ABNORMAL LOW (ref 4.22–5.81)
RDW: 21 % — ABNORMAL HIGH (ref 11.5–15.5)
WBC: 3.5 10*3/uL — ABNORMAL LOW (ref 4.0–10.5)
nRBC: 0 % (ref 0.0–0.2)

## 2020-12-14 LAB — TSH: TSH: 8 u[IU]/mL — ABNORMAL HIGH (ref 0.35–4.50)

## 2020-12-14 LAB — RESP PANEL BY RT-PCR (FLU A&B, COVID) ARPGX2
Influenza A by PCR: NEGATIVE
Influenza B by PCR: NEGATIVE
SARS Coronavirus 2 by RT PCR: NEGATIVE

## 2020-12-14 LAB — LACTIC ACID, PLASMA
Lactic Acid, Venous: 1.6 mmol/L (ref 0.5–1.9)
Lactic Acid, Venous: 1.5 mmol/L (ref 0.5–1.9)

## 2020-12-14 LAB — PREPARE RBC (CROSSMATCH)

## 2020-12-14 MED ORDER — OXYCODONE-ACETAMINOPHEN 5-325 MG PO TABS
2.0000 | ORAL_TABLET | Freq: Once | ORAL | Status: AC
  Administered 2020-12-14: 2 via ORAL
  Filled 2020-12-14: qty 2

## 2020-12-14 MED ORDER — ALBUTEROL SULFATE (2.5 MG/3ML) 0.083% IN NEBU: 2.5000 mg | INHALATION_SOLUTION | RESPIRATORY_TRACT | Status: DC | PRN

## 2020-12-14 MED ORDER — PROPRANOLOL HCL 20 MG PO TABS
20.0000 mg | ORAL_TABLET | Freq: Two times a day (BID) | ORAL | Status: DC
  Administered 2020-12-15 – 2020-12-18 (×6): 20 mg via ORAL
  Filled 2020-12-14 (×7): qty 1

## 2020-12-14 MED ORDER — ADULT MULTIVITAMIN W/MINERALS CH
1.0000 | ORAL_TABLET | Freq: Every day | ORAL | Status: DC
  Administered 2020-12-15 – 2020-12-18 (×4): 1 via ORAL
  Filled 2020-12-14 (×4): qty 1

## 2020-12-14 MED ORDER — SENNA 8.6 MG PO TABS
1.0000 | ORAL_TABLET | Freq: Two times a day (BID) | ORAL | Status: DC
  Administered 2020-12-14 – 2020-12-17 (×4): 8.6 mg via ORAL
  Filled 2020-12-14 (×5): qty 1

## 2020-12-14 MED ORDER — LEVOTHYROXINE SODIUM 50 MCG PO TABS
50.0000 ug | ORAL_TABLET | Freq: Every day | ORAL | Status: DC
  Administered 2020-12-15 – 2020-12-18 (×3): 50 ug via ORAL
  Filled 2020-12-14 (×3): qty 1

## 2020-12-14 MED ORDER — SODIUM CHLORIDE 0.9 % IV SOLN: INTRAVENOUS | Status: AC

## 2020-12-14 MED ORDER — MORPHINE SULFATE ER 15 MG PO TBCR
15.0000 mg | EXTENDED_RELEASE_TABLET | Freq: Two times a day (BID) | ORAL | Status: DC
  Administered 2020-12-14 – 2020-12-18 (×8): 15 mg via ORAL
  Filled 2020-12-14 (×8): qty 1

## 2020-12-14 MED ORDER — HYDRALAZINE HCL 25 MG PO TABS: 25.0000 mg | ORAL_TABLET | Freq: Three times a day (TID) | ORAL | Status: DC | PRN

## 2020-12-14 MED ORDER — SODIUM CHLORIDE 0.9 % IV SOLN: 10.0000 mL/h | Freq: Once | INTRAVENOUS | Status: DC

## 2020-12-14 MED ORDER — VANCOMYCIN HCL IN DEXTROSE 1-5 GM/200ML-% IV SOLN
1000.0000 mg | Freq: Once | INTRAVENOUS | Status: AC
  Administered 2020-12-14: 1000 mg via INTRAVENOUS
  Filled 2020-12-14: qty 200

## 2020-12-14 MED ORDER — OXYCODONE HCL 5 MG PO TABS
30.0000 mg | ORAL_TABLET | Freq: Four times a day (QID) | ORAL | Status: DC | PRN
  Administered 2020-12-14 – 2020-12-18 (×12): 30 mg via ORAL
  Filled 2020-12-14 (×12): qty 6

## 2020-12-14 MED ORDER — PANTOPRAZOLE SODIUM 40 MG IV SOLR
40.0000 mg | Freq: Two times a day (BID) | INTRAVENOUS | Status: DC
  Administered 2020-12-14 – 2020-12-18 (×7): 40 mg via INTRAVENOUS
  Filled 2020-12-14 (×7): qty 40

## 2020-12-14 MED ORDER — SODIUM CHLORIDE 0.9 % IV SOLN
2.0000 g | Freq: Every day | INTRAVENOUS | Status: DC
  Administered 2020-12-14 – 2020-12-15 (×2): 2 g via INTRAVENOUS
  Filled 2020-12-14 (×3): qty 20

## 2020-12-14 MED ORDER — NALOXONE HCL 4 MG/0.1ML NA LIQD
0.4000 mg | NASAL | Status: DC | PRN
  Filled 2020-12-14: qty 8

## 2020-12-14 MED ORDER — ALPRAZOLAM 0.5 MG PO TABS
1.0000 mg | ORAL_TABLET | Freq: Three times a day (TID) | ORAL | Status: DC
  Administered 2020-12-14 – 2020-12-18 (×11): 1 mg via ORAL
  Filled 2020-12-14 (×11): qty 2

## 2020-12-14 MED ORDER — LOSARTAN POTASSIUM 25 MG PO TABS
25.0000 mg | ORAL_TABLET | Freq: Every day | ORAL | Status: DC
  Administered 2020-12-15 – 2020-12-18 (×4): 25 mg via ORAL
  Filled 2020-12-14 (×4): qty 1

## 2020-12-14 MED ORDER — LUBIPROSTONE 24 MCG PO CAPS
24.0000 ug | ORAL_CAPSULE | Freq: Every day | ORAL | Status: DC | PRN
  Filled 2020-12-14: qty 1

## 2020-12-14 NOTE — ED Notes (Signed)
Patient provided pain medication for chronic pain at this time.

## 2020-12-14 NOTE — ED Provider Notes (Signed)
[facility] Emergency Department Provider Note  Time seen: 12:32 PM  I have reviewed the triage vital signs and the nursing notes.   HISTORY  Chief Complaint Leg Pain   HPI [name]. is a 70 y.o. 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  According to the patient over the past 2 weeks or so he has had increased redness swelling and now weeping from the left lower extremity.  Patient saw his doctor who thought he needed to be admitted for IV antibiotics and sent the patient to the emergency department.  Patient denies any fever.  Does state mild discomfort in the leg.  States the redness swelling and weeping is gotten significantly worse over the past 1 to 2 weeks.   Past Medical History:  Diagnosis Date  . 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 ANXIETY 03/24/2007  . Chronic pain   . CIRRHOSIS 03/24/2007  . 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 FRACTURE, WRIST, LEFT 10/12/2009   Qualifier: Diagnosis of  By: Loanne Drilling MD, Jacelyn Pi   . HEPATITIS C 03/24/2007  . HYPERLIPIDEMIA 09/12/2009  . Hypocalcemia 12/23/2007   Qualifier: Diagnosis of  By: Loanne Drilling MD, Jacelyn Pi   . Pancytopenia 12/23/2007  . 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 Squamous cell carcinoma of skin 05/21/2018   R mid volar forearm  . Thyroid disease   . UROLITHIASIS, HX OF 12/23/2007    Patient Active Problem List   Diagnosis Date Noted  . Cellulitis of left lower extremity 12/14/2020  . Eye trauma 02/29/2020  . History of pancytopenia 02/29/2020  . Portal venous hypertension ([place]) 02/29/2020  . S/P AKA (above knee amputation) unilateral ([place]) 02/29/2020  . Hepatic cirrhosis due to chronic hepatitis C infection ([place]) 02/29/2020  . Pharmacologic therapy 02/29/2020  . Disorder of skeletal  system 02/29/2020  . Problems influencing health status 02/29/2020  . HTN (hypertension) 09/14/2019  . Hepatic encephalopathy ([place]) 04/07/2018  . Osteoarthritis of left knee 02/26/2018  . Chronic pain syndrome 11/23/2016  . Hypothyroidism 09/25/2016  . 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 Hepatitis B core antibody positive 05/22/2016  . 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[place]) 04/14/2015  . Cellulitis of right thigh 05/21/2012  . Pancytopenia ([place]) 02/26/2012  . Encounter for long-term (current) use of other medications 02/26/2012  . Screening for prostate cancer 02/26/2012  . Anemia 02/26/2012  . Closed fracture of carpal bone 10/12/2009  . Dyslipidemia 09/12/2009  . Phantom limb syndrome ([place]) 09/12/2009  . Hypocalcemia 12/23/2007  . 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 History of nephrolithiasis 12/23/2007  . Anxiety 03/24/2007  . Hepatic cirrhosis ([place]) 03/24/2007    Past Surgical History:  Procedure Laterality Date  . ABOVE KNEE LEG AMPUTATION Right    Right femur fracture, 11/1994, resulted in right AKA 2006 after staph infection    Prior to Admission medications   Medication Sig Start Date End Date Taking? Authorizing Provider  ALPRAZolam Duanne Moron) 1 MG tablet Take 1 mg by mouth 3 (three) times daily.    [provider]  Cholecalciferol (VITAMIN D3) 10 MCG (400 UNIT) CAPS Take 4 capsules by mouth daily.    [provider]  folic acid (FOLVITE) 1 MG tablet TAKE 1 TABLET(1 MG) BY MOUTH DAILY 08/05/20   Randa Evens  C, MD  hydrochlorothiazide (HYDRODIURIL) 12.5 MG tablet TAKE 1 TABLET(12.5 MG) BY MOUTH DAILY 11/23/20   [name], FNP  levothyroxine (SYNTHROID) 50 MCG tablet TAKE 1 TABLET(50 MCG) BY MOUTH DAILY BEFORE BREAKFAST 08/26/20   Renato Shin, MD  losartan (COZAAR) 100 MG tablet TAKE 1 TABLET(100 MG) BY MOUTH DAILY 11/15/20   [name], FNP  lubiprostone (AMITIZA) 24 MCG capsule Take 24 mcg by mouth daily as needed.  08/13/14   [provider]  meloxicam (MOBIC) 15 MG tablet Take 1 tablet by mouth every morning. 11/11/20   [provider]  morphine (MS CONTIN) 15 MG 12 hr tablet Take 15 mg by mouth 2 (two) times daily as needed. 04/06/20   [provider]  NARCAN 4 MG/0.1ML LIQD nasal spray kit SPRAY 0.1 ML (4 MG) IN 1 NOSTRIL/ MAY REPEAT DOSE EVERY 2 3 MINUTES AS NEEDED ALTERNATING NOSTRILS 12/29/18   [provider]  oxycodone (ROXICODONE) 30 MG immediate release tablet Take 1 tablet by mouth 5 (five) times daily as needed. 03/24/18   [provider]  propranolol (INDERAL) 40 MG tablet TAKE 1 TABLET(40 MG) BY MOUTH TWICE DAILY 12/02/20   [name], FNP  vitamin B-12 (CYANOCOBALAMIN) 1000 MCG tablet Take 1 tablet (1,000 mcg total) by mouth daily. 04/09/18   Bettey Costa, MD    Allergies  Allergen Reactions  . Clindamycin Dermatitis    Family History  Problem Relation Age of Onset  . Heart disease Mother   . Lung cancer Mother 45  . Heart attack Paternal Grandmother   . Heart attack Paternal Grandfather   . Liver cancer Neg Hx     Social History Social History   Tobacco Use  . Smoking status: Never Smoker  . Smokeless tobacco: Never Used  Substance Use Topics  . Alcohol use: Not Currently    Alcohol/week: 0.0 standard drinks  . Drug use: No    Review of Systems Constitutional: Negative for fever. Cardiovascular: Negative for chest pain. Respiratory: Negative for shortness of breath. Gastrointestinal: Negative for abdominal pain Musculoskeletal: Left leg redness swelling and weeping Skin: Redness of the left lower extremity below the knee Neurological: Negative for headache All other ROS negative  ____________________________________________   PHYSICAL EXAM:  VITAL SIGNS: ED Triage Vitals  Enc Vitals Group     BP 12/14/20 1151 (!) 127/44     Pulse Rate 12/14/20 1151 61     Resp 12/14/20 1151 18     Temp 12/14/20 1151 98.4 F (36.9  C)     Temp Source 12/14/20 1151 Oral     SpO2 12/14/20 1151 97 %     Weight 12/14/20 1211 220 lb 7.4 oz (100 kg)     Height 12/14/20 1211 6' (1.829 m)     Head Circumference --      Peak Flow --      Pain Score 12/14/20 1152 7     Pain Loc --      Pain Edu? --      Excl. in [place]? --    Constitutional: Alert and oriented. Well appearing and in no distress. Eyes: Normal exam ENT      Head: Normocephalic and atraumatic.      Mouth/Throat: Mucous membranes are moist. Cardiovascular: Normal rate, regular rhythm.  Respiratory: Normal respiratory effort without tachypnea nor retractions. Breath sounds are clear Gastrointestinal: Soft and nontender. No distention.  Musculoskeletal: Right AKA.  Left lower extremity has 1-2+ edema below the knee with  erythema and several areas of weeping Neurologic:  Normal speech and language. No gross focal neurologic deficits Skin:  Skin is warm.  Redness and weeping as described above in the left lower extremity. Psychiatric: Mood and affect are normal.  ____________________________________________   RADIOLOGY  Ultrasound negative for DVT  ____________________________________________   INITIAL IMPRESSION / ASSESSMENT AND PLAN / ED COURSE  Pertinent labs & imaging results that were available during my care of the patient were reviewed by me and considered in my medical decision making (see chart for details).   Patient presents emergency department for left lower extremity redness discomfort weeping concern for cellulitis sent by his PCP for IV antibiotics.  Patient's exam is consistent with likely cellulitis versus peripheral edema.  We will also check an ultrasound to rule out DVT.  We will check labs including a lactic acid and blood cultures.  We will start the patient on IV vancomycin while awaiting lab results.  Patient agreeable to plan of care.  Patient's hemoglobin is 6.1.  Rectal examination shows light brown stool but it is guaiac  positive.  Likely the cause of the patient's anemia we will transfuse with 2 units of packed red blood cells and admit to the hospitalist service.  Patient's ultrasound is negative for DVT but clinically appears to have cellulitis we will continue with IV antibiotics and admit to the hospitalist service.  Donne Anon Rivero Jr. was evaluated in Emergency Department on 12/14/2020 for the symptoms described in the history of present illness. He was evaluated in the context of the global COVID-19 pandemic, which necessitated consideration that the patient might be at risk for infection with the SARS-CoV-2 virus that causes COVID-19. Institutional protocols and algorithms that pertain to the evaluation of patients at risk for COVID-19 are in a state of rapid change based on information released by regulatory bodies including the CDC and federal and state organizations. These policies and algorithms were followed during the patient's care in the ED.  CRITICAL CARE Performed by: Harvest Dark   Total critical care time: 30 minutes  Critical care time was exclusive of separately billable procedures and treating other patients.  Critical care was necessary to treat or prevent imminent or life-threatening deterioration.  Critical care was time spent personally by me on the following activities: development of treatment plan with patient and/or surrogate as well as nursing, discussions with consultants, evaluation of patient's response to treatment, examination of patient, obtaining history from patient or surrogate, ordering and performing treatments and interventions, ordering and review of laboratory studies, ordering and review of radiographic studies, pulse oximetry and re-evaluation of patient's condition.  ____________________________________________   FINAL CLINICAL IMPRESSION(S) / ED DIAGNOSES  Left lower extremity cellulitis Symptomatic anemia GI bleed   Harvest Dark, MD 12/14/20  1406

## 2020-12-14 NOTE — Progress Notes (Signed)
TSH for follow up visit on 12/14/20 he was sent to the emergency room at that visit and is now admitted.

## 2020-12-14 NOTE — ED Notes (Signed)
Pt moved to room 38. Provided with new urinal and tv remote. Has no additional requests at this time.

## 2020-12-14 NOTE — ED Notes (Signed)
Patient moved to [place] pending admission to floor.

## 2020-12-14 NOTE — Consult Note (Signed)
[name], MD [address], [place], [place], [place], [address] [id number] [place], [place], [place], [address] Phone: [phone]  Fax: [phone]  Consultation  Referring Provider:     Dr. Phineas Inches Primary Care Physician:  [name], FNP Reason for Consultation:     Anemia Primary gastroenterologist: Dr. Allen Norris  Date of Admission:  12/14/2020 Date of Consultation:  12/14/2020         HPI:   [name]. is a 70 y.o. male with history of hep C status posttreatment, cirrhosis, admitted for lower extremity cellulitis, with previous history of right AKA, with GI consulted for anemia with no evidence of active GI bleeding.  The patient denies abdominal or flank pain, anorexia, nausea or vomiting, dysphagia, change in bowel habits or black or bloody stools or weight loss.  Patient has previously been evaluated by hematology, Dr. 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  Patient was previously evaluated at [facility] in 2018 and as per their last clinic note in April 2018, patient had no evidence of varices on upper endoscopy in 2017, esophageal nodule was seen and biopsied, Z-line was regular.  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  Gastric erythema was noted.  Biopsies were negative for H. pylori.    Colonoscopy in 2017 showed poor prep, external hemorrhoids,, internal hemorrhoids, 7 polyps removed and showed tubular adenoma.  Past Medical History:  Diagnosis Date  . AKA, RIGHT, HX OF 12/23/2007   Qualifier: Diagnosis of  By: Loanne Drilling MD,  Jacelyn Pi   . ANXIETY 03/24/2007  . Chronic pain   . CIRRHOSIS 03/24/2007  . 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 FRACTURE, WRIST, LEFT 10/12/2009   Qualifier: Diagnosis of  By: Loanne Drilling MD, Jacelyn Pi   . HEPATITIS C 03/24/2007  . HYPERLIPIDEMIA 09/12/2009  . Hypocalcemia 12/23/2007   Qualifier: Diagnosis of  By: Loanne Drilling MD, Jacelyn Pi   . Pancytopenia 12/23/2007  . 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 Squamous cell carcinoma of skin 05/21/2018   R mid volar forearm  . Thyroid disease   . UROLITHIASIS, HX OF 12/23/2007    Past Surgical History:  Procedure Laterality Date  . ABOVE KNEE LEG AMPUTATION Right    Right femur fracture, 11/1994, resulted in right AKA 2006 after staph infection    Prior to Admission medications   Medication Sig Start Date End Date Taking? Authorizing Provider  ALPRAZolam Duanne Moron) 1 MG tablet Take 1 mg by mouth 3 (three) times daily.   Yes [provider]  Cholecalciferol (VITAMIN D3) 10 MCG (400 UNIT) CAPS Take 4 capsules by mouth daily.   Yes [provider]  folic acid (FOLVITE) 1 MG tablet TAKE 1 TABLET(1 MG) BY MOUTH DAILY 08/05/20  Yes Sindy Guadeloupe, MD  hydrochlorothiazide (HYDRODIURIL) 12.5 MG tablet TAKE 1 TABLET(12.5 MG) BY MOUTH DAILY 11/23/20  Yes [name], FNP  levothyroxine (SYNTHROID) 50 MCG tablet TAKE 1 TABLET(50 MCG) BY MOUTH DAILY BEFORE BREAKFAST 08/26/20  Yes Renato Shin, MD  losartan (COZAAR) 100 MG tablet TAKE 1 TABLET(100 MG) BY MOUTH DAILY 11/15/20  Yes [name],  FNP  lubiprostone (AMITIZA) 24 MCG capsule Take 24 mcg by mouth daily as needed. 08/13/14  Yes [provider]  meloxicam (MOBIC) 15 MG tablet Take 1 tablet by mouth every morning. 11/11/20  Yes [provider]  morphine (MS CONTIN) 15 MG 12 hr tablet Take 15 mg by mouth 2 (two) times daily as needed. 04/06/20  Yes [provider]  oxycodone (ROXICODONE) 30 MG immediate release tablet Take 1 tablet by mouth 5 (five) times daily as  needed. 03/24/18  Yes [provider]  propranolol (INDERAL) 40 MG tablet TAKE 1 TABLET(40 MG) BY MOUTH TWICE DAILY 12/02/20  Yes Arnett, [name], FNP  vitamin B-12 (CYANOCOBALAMIN) 1000 MCG tablet Take 1 tablet (1,000 mcg total) by mouth daily. 04/09/18  Yes Mody, Sital, MD  NARCAN 4 MG/0.1ML LIQD nasal spray kit SPRAY 0.1 ML (4 MG) IN 1 NOSTRIL/ MAY REPEAT DOSE EVERY 2 3 MINUTES AS NEEDED ALTERNATING NOSTRILS 12/29/18   [provider]    Family History  Problem Relation Age of Onset  . Heart disease Mother   . Lung cancer Mother 42  . Heart attack Paternal Grandmother   . Heart attack Paternal Grandfather   . Liver cancer Neg Hx      Social History   Tobacco Use  . Smoking status: Never Smoker  . Smokeless tobacco: Never Used  Substance Use Topics  . Alcohol use: Not Currently    Alcohol/week: 0.0 standard drinks  . Drug use: No    Allergies as of 12/14/2020 - Review Complete 12/14/2020  Allergen Reaction Noted  . Clindamycin Dermatitis 07/08/2012    Review of Systems:    All systems reviewed and negative except where noted in HPI.   Physical Exam:  Vital signs in last 24 hours: Vitals:   12/14/20 1211 12/14/20 1706 12/14/20 1832 12/14/20 1955  BP:  (!) 144/63 (!) 148/62 (!) 105/42  Pulse:  64 77 (!) 59  Resp:  18  18  Temp:  98.7 F (37.1 C)  98 F (36.7 C)  TempSrc:  Oral  Oral  SpO2:  100% 99% 98%  Weight: 100 kg     Height: 6' (1.829 m)        General:   Pleasant, cooperative in NAD Head:  Normocephalic and atraumatic. Eyes:   No icterus.   Conjunctiva pink. PERRLA. Ears:  Normal auditory acuity. Neck:  Supple; no masses or thyroidomegaly Lungs: Respirations even and unlabored. Lungs clear to auscultation bilaterally.   No wheezes, crackles, or rhonchi.  Abdomen:  Soft, nondistended, nontender. Normal bowel sounds. No appreciable masses or hepatomegaly.  No rebound or guarding.  Neurologic:  Alert and oriented x3;  grossly normal  neurologically. Skin:  Intact without significant lesions or rashes. Cervical Nodes:  No significant cervical adenopathy. Psych:  Alert and cooperative. Normal affect.  LAB RESULTS: Recent Labs    12/14/20 1159 12/14/20 1800  WBC 3.5*  --   HGB 6.1* 6.7*  HCT 19.2* 20.4*  PLT 42*  --    BMET Recent Labs    12/14/20 1159  NA 137  K 4.4  CL 100  CO2 26  GLUCOSE 114*  BUN 18  CREATININE 0.70  CALCIUM 9.5   LFT No results for input(s): PROT, ALBUMIN, AST, ALT, ALKPHOS, BILITOT, BILIDIR, IBILI in the last 72 hours. PT/INR No results for input(s): LABPROT, INR in the last 72 hours.  STUDIES: US Venous Img Lower Unilateral Left  Result Date: 12/14/2020 CLINICAL DATA:  Swelling  redness of the left leg. EXAM: LEFT LOWER EXTREMITY VENOUS DOPPLER ULTRASOUND TECHNIQUE: Gray-scale sonography with compression, as well as color and duplex ultrasound, were performed to evaluate the deep venous system(s) from the level of the common femoral vein through the popliteal and proximal calf veins. COMPARISON:  None. FINDINGS: VENOUS Normal compressibility of the common femoral, superficial femoral, and popliteal veins, as well as the visualized calf veins. Visualized portions of profunda femoral vein and great saphenous vein unremarkable. No filling defects to suggest DVT on grayscale or color Doppler imaging. Doppler waveforms show normal direction of venous flow, normal respiratory plasticity and response to augmentation. Limited views of the contralateral common femoral vein are unremarkable. IMPRESSION: No evidence of DVT. Electronically Signed   By: Margaretha Sheffield MD   On: 12/14/2020 14:03      Impression / Plan:   Derek Anon Goley [name]. is a 70 y.o. 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  Once patient is medically optimized as far as improvement in Hgb, treatment of his subacute cellulitis, endoscopy can be done after platelet transfusion with goal of platelet count to be above 50,000.    Patient is also due for EGD for variceal screening as repeat EGD was recommended in 2 years after the one in 2017, and also due for colonoscopy given his history of polyps  Patient is tolerating solid food diet today without difficulty  Please also check INR and correct with vitamin K and FFP's if necessary  Low suspicion for variceal bleed given absence of any gross bleeding  PPI IV twice daily is reasonable at this time  If any evidence of active GI bleeding occur, please also start octreotide drip  PPI IV twice daily  Continue serial CBCs and transfuse PRN Avoid NSAIDs Maintain 2 large-bore IV lines Please page GI with any acute hemodynamic changes, or signs of active GI bleeding   Thank you for involving me in the care of this patient.      LOS: 0 days   Virgel Manifold, MD  12/14/2020, 8:26 PM

## 2020-12-14 NOTE — ED Notes (Signed)
Patient reports weeping, and dry, cracking skin and redness to the LLE. Patient reports his doctor "sent him here to be admitted for antibiotics". Patient is noted to have a AKA to the RLE. Patient reports hx of skin infections, and reports hx of PICC line for home abx. Redness and abrasions noted to LLE.

## 2020-12-14 NOTE — Progress Notes (Signed)
Acute Office Visit  Subjective:    Patient ID: [name]., male    DOB: [birth year], 70 y.o.   MRN: [id number]  Chief Complaint  Patient presents with  . Follow-up    Pt fell and broke his knee. Pt had to have a foam injection in left knee. Pt c/o leg peeling and dry skin, knee pain and swelling.    HPI   His caregiver is with him.   Patient is in today for peeling skin in his lower left leg swelling and foot swelling for 2 months. He does have pain in the area. Warm foot. Warm lower leg and pain is increasing over the last 2 weeks and leg is peeling. Draining. Increasing Fatigue.   He had an injection in his left knee 4 months ago at pain management.  He is amputee of right leg.  Denies any fever or chills.   Denies any loss of appetite.  Mild shortness of breath.    Of note he would like TSH rechecked, he has not had level checked he had another prescribe prescribing it and would like this office to manage.   Past Medical History:  Diagnosis Date  . 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 ANXIETY 03/24/2007  . Chronic pain   . CIRRHOSIS 03/24/2007  . 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 FRACTURE, WRIST, LEFT 10/12/2009   Qualifier: Diagnosis of  By: Loanne Drilling MD, Jacelyn Pi   . HEPATITIS C 03/24/2007  . HYPERLIPIDEMIA 09/12/2009  . Hypocalcemia 12/23/2007   Qualifier: Diagnosis of  By: Loanne Drilling MD, Jacelyn Pi   . Pancytopenia 12/23/2007  . 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 Squamous cell carcinoma of skin 05/21/2018   R mid volar forearm  . Thyroid disease   . UROLITHIASIS, HX OF 12/23/2007    Past Surgical History:  Procedure Laterality Date  . ABOVE KNEE LEG AMPUTATION Right    Right femur fracture, 11/1994, resulted in right AKA 2006 after staph infection    Family History  Problem Relation Age of Onset  . Heart disease Mother   . Lung cancer Mother 4  . Heart attack Paternal Grandmother   . Heart attack  Paternal Grandfather   . Liver cancer Neg Hx     Social History   Socioeconomic History  . Marital status: Widowed    Spouse name: Not on file  . Number of children: Not on file  . Years of education: Not on file  . Highest education level: Not on file  Occupational History  . Occupation: Disabled  Tobacco Use  . Smoking status: Never Smoker  . Smokeless tobacco: Never Used  Substance and Sexual Activity  . Alcohol use: Not Currently    Alcohol/week: 0.0 standard drinks  . Drug use: No  . Sexual activity: Not on file  Other Topics Concern  . Not on file  Social History Narrative   Widowed 1997   Lives alone   Lost a son in his arms in 1978.    3 sons   Social Determinants of Health   Financial Resource Strain: Low Risk   . Difficulty of Paying Living Expenses: Not hard at all  Food Insecurity: No Food Insecurity  . Worried About Charity fundraiser in the Last Year: Never true  . Ran Out of Food in the Last Year: Never true  Transportation Needs: No Transportation Needs  . Lack of Transportation (  Medical): No  . Lack of Transportation (Non-Medical): No  Physical Activity: Not on file  Stress: No Stress Concern Present  . Feeling of Stress : Not at all  Social Connections: Unknown  . Frequency of Communication with Friends and Family: More than three times a week  . Frequency of Social Gatherings with Friends and Family: More than three times a week  . Attends Religious Services: Not on file  . Active Member of Clubs or Organizations: Not on file  . Attends Archivist Meetings: Not on file  . Marital Status: Widowed  Intimate Partner Violence: Not At Risk  . Fear of Current or Ex-Partner: No  . Emotionally Abused: No  . Physically Abused: No  . Sexually Abused: No    Outpatient Medications Prior to Visit  Medication Sig Dispense Refill  . ALPRAZolam (XANAX) 1 MG tablet Take 1 mg by mouth 3 (three) times daily.    . Cholecalciferol (VITAMIN D3) 10  MCG (400 UNIT) CAPS Take 4 capsules by mouth daily.    . folic acid (FOLVITE) 1 MG tablet TAKE 1 TABLET(1 MG) BY MOUTH DAILY 30 tablet 3  . hydrochlorothiazide (HYDRODIURIL) 12.5 MG tablet TAKE 1 TABLET(12.5 MG) BY MOUTH DAILY 90 tablet 1  . levothyroxine (SYNTHROID) 50 MCG tablet TAKE 1 TABLET(50 MCG) BY MOUTH DAILY BEFORE BREAKFAST 90 tablet 0  . losartan (COZAAR) 100 MG tablet TAKE 1 TABLET(100 MG) BY MOUTH DAILY 90 tablet 1  . lubiprostone (AMITIZA) 24 MCG capsule Take 24 mcg by mouth daily as needed.    . meloxicam (MOBIC) 15 MG tablet Take 1 tablet by mouth every morning.    Marland Kitchen morphine (MS CONTIN) 15 MG 12 hr tablet Take 15 mg by mouth 2 (two) times daily as needed.    Marland Kitchen NARCAN 4 MG/0.1ML LIQD nasal spray kit SPRAY 0.1 ML (4 MG) IN 1 NOSTRIL/ MAY REPEAT DOSE EVERY 2 3 MINUTES AS NEEDED ALTERNATING NOSTRILS    . oxycodone (ROXICODONE) 30 MG immediate release tablet Take 1 tablet by mouth 5 (five) times daily as needed.    . propranolol (INDERAL) 40 MG tablet TAKE 1 TABLET(40 MG) BY MOUTH TWICE DAILY 180 tablet 1  . vitamin B-12 (CYANOCOBALAMIN) 1000 MCG tablet Take 1 tablet (1,000 mcg total) by mouth daily.     No facility-administered medications prior to visit.    Allergies  Allergen Reactions  . Clindamycin Dermatitis    Review of Systems  Constitutional: Positive for fatigue. Negative for activity change, appetite change, chills, diaphoresis, fever and unexpected weight change.  HENT: Negative.   Eyes: Negative.   Respiratory: Positive for shortness of breath. Negative for apnea, cough, choking, chest tightness, wheezing and stridor.   Cardiovascular: Negative.   Gastrointestinal: Negative.   Genitourinary: Negative.   Musculoskeletal: Positive for arthralgias and joint swelling.  Skin: Positive for color change and wound.  Neurological: Negative.   Psychiatric/Behavioral: Negative.        Objective:    Physical Exam Vitals reviewed.  Constitutional:      General: He  is not in acute distress.    Appearance: He is ill-appearing. He is not toxic-appearing or diaphoretic.     Comments: Caregiver present with him.   HENT:     Mouth/Throat:     Mouth: Mucous membranes are moist.  Eyes:     Conjunctiva/sclera: Conjunctivae normal.     Pupils: Pupils are equal, round, and reactive to light.  Cardiovascular:     Rate and Rhythm: Normal  rate and regular rhythm.     Pulses: Normal pulses.     Heart sounds: Normal heart sounds.  Pulmonary:     Effort: Pulmonary effort is normal.     Breath sounds: Normal breath sounds.  Abdominal:     Palpations: Abdomen is soft.  Musculoskeletal:        General: Deformity (right leg AKA ) present.     Cervical back: Normal range of motion and neck supple.     Left lower leg: Edema present.  Skin:    General: Skin is warm.     Findings: Erythema, rash and wound present. Rash is crusting and scaling. Rash is not macular.          Comments: Left lower extremity with moderate erythema and peeling skin from knee to foot, tight with fluid, left lower foot is warm, swollen and drainage of clear fluid with moderate erythema entire foot.  DP pulse is not able to be palpated.   Neurological:     Mental Status: He is alert and oriented to person, place, and time.     Gait: Gait abnormal (right leg AKA, ).  Psychiatric:        Mood and Affect: Mood normal.        Behavior: Behavior normal.        Thought Content: Thought content normal.        Judgment: Judgment normal.     BP (!) 120/50 (BP Location: Left Arm, Patient Position: Sitting)   Pulse 76   Temp (!) 97.5 F (36.4 C)   Ht 6' 0.99" (1.854 m)   SpO2 96%   BMI 29.03 kg/m  Wt Readings from Last 3 Encounters:  08/08/20 220 lb (99.8 kg)  08/05/20 220 lb (99.8 kg)  08/01/20 220 lb (99.8 kg)   Today's Vitals   12/14/20 0920  BP: (!) 120/50  Pulse: 76  Temp: (!) 97.5 F (36.4 C)  SpO2: 96%  Height: 6' 0.99" (1.854 m)  PainSc: 6   PainLoc: Leg    Vitals  with BMI 12/14/2020 08/08/2020 08/05/2020  Height 6' 0.992" 6' 1"  -  Weight - 220 lbs 220 lbs  BMI - 16.10 96.04  Systolic 540 (No Data) -  Diastolic 50 (No Data) -  Pulse 76 - -   Body mass index is 29.03 kg/m. There are no preventive care reminders to display for this patient.  There are no preventive care reminders to display for this patient.   Media Information         Document Information  Photos  Left lower foot   12/14/2020 09:44  Attached To:  Office Visit on 12/14/20 with Hydie Langan, Kelby Aline, FNP   Source Information  Anni Hocevar, Kelby Aline, FNP  [facility]   Media Information         Document Information  Photos  Left leg   12/14/2020 09:44  Attached To:  Office Visit on 12/14/20 with Leigha Olberding, Kelby Aline, FNP   Source Information  Doreen Beam, FNP  [facility]     Lab Results  Component Value Date   TSH 4.47 11/16/2019   Lab Results  Component Value Date   WBC 3.6 (L) 04/06/2020   HGB 11.8 (L) 04/06/2020   HCT 34.5 (L) 04/06/2020   MCV 96.9 04/06/2020   PLT 50 (L) 04/06/2020   Lab Results  Component Value Date   NA 135 04/06/2020   K 3.8 04/06/2020   CO2 24 04/06/2020   GLUCOSE 114 (H)  04/06/2020   BUN 14 04/06/2020   CREATININE 0.59 (L) 04/06/2020   BILITOT 2.8 (H) 04/06/2020   ALKPHOS 58 04/06/2020   AST 26 04/06/2020   ALT 23 04/06/2020   PROT 8.6 (H) 04/06/2020   ALBUMIN 5.0 04/06/2020   CALCIUM 9.4 04/06/2020   ANIONGAP 12 04/06/2020   GFR 117.15 11/13/2019   Lab Results  Component Value Date   CHOL 121 11/13/2019   Lab Results  Component Value Date   HDL 41.30 11/13/2019   Lab Results  Component Value Date   LDLCALC 54 11/13/2019   Lab Results  Component Value Date   TRIG 127.0 11/13/2019   Lab Results  Component Value Date   CHOLHDL 3 11/13/2019   Lab Results  Component Value Date   HGBA1C 3.4 (L) 11/13/2019       Assessment & Plan:   Problem List Items Addressed This  Visit      Endocrine   Hypothyroidism   Relevant Orders   TSH     Other   S/P AKA (above knee amputation) unilateral (HCC)   Cellulitis of left lower extremity - Primary   Relevant Orders   CBC with Differential/Platelet   Comprehensive metabolic panel     1. Cellulitis of left lower extremity Given his history in the past with staph and hospitalization, concerned he needs to go to the emergency room for higher level of care ,left lower extremity is very warm and tender to touch and very tight with fluid swelling. He has a caregiver with him and they are both in agreement to go.  He will need blood cultures, DVT lower extremity to rule out DVT and likely IV antibiotics will benefit him the best given the over 2 month duration of present illness.  Provider is concerned for possible sepsis.  He does have low temperature today in office. Heart rate is within normal.   - CBC with Differential/Platelet - Comprehensive metabolic panel  2. Hypothyroidism, unspecified type This is not acute but he request to have office manage his Synthroid. Will check lab.  - TSH  3. S/P AKA (above knee amputation) unilateral ([place])- right knee Noted for consistency of care.  No orders of the defined types were placed in this encounter.   DISPOSITION: GO TO THE EMERGENCY ROOM NOW, Patient and caregiver verbalized understanding of all instructions given and denies any further questions at this time.    Return precautions given. Red Flags discussed. The patient was given clear instructions to go to ER or return to medical center if any red flags develop, symptoms do not improve, worsen or new problems develop. They verbalized understanding.    Risks, benefits, and alternatives of the medications and treatment plan prescribed today were discussed, and patient expressed understanding.    Education regarding symptom management and diagnosis given to patient on AVS.  Patient was in agreement with  treatment plan.   Continue to follow with  Kelby Aline. Kaiel Weide AGNP-C, FNP-C for routine health maintenance.   Kelby Aline. Jayline Kilburg AGNP-C, FNP-C   Marcille Buffy, FNP

## 2020-12-14 NOTE — Addendum Note (Signed)
Addended by: Neta Ehlers on: 12/14/2020 03:38 PM   Modules accepted: Orders

## 2020-12-14 NOTE — ED Notes (Signed)
Ultrasound at bedside

## 2020-12-14 NOTE — Telephone Encounter (Signed)
Received a call from [place] at [facility] hospital. Spoke with Laverna Peace, FNP, and informed Edwin Dada that Zyen is not receiving a direct admission from the ER and that he needs to be evaluated. Sherika verbalized understanding and had no further questions.

## 2020-12-14 NOTE — Patient Instructions (Addendum)
Go to the emergency room now, for cellulitis left lower leg knee to foot will need blood cultures, and IV antibiotics. DVT ultrasound needed.     Cellulitis, Adult  Cellulitis is a skin infection. The infected area is often warm, red, swollen, and sore. It occurs most often in the arms and lower legs. It is very important to get treated for this condition. What are the causes? This condition is caused by bacteria. The bacteria enter through a break in the skin, such as a cut, burn, insect bite, open sore, or crack. What increases the risk? This condition is more likely to occur in people who:  Have a weak body defense system (immune system).  Have open cuts, burns, bites, or scrapes on the skin.  Are older than 72 years of age.  Have a blood sugar problem (diabetes).  Have a long-lasting (chronic) liver disease (cirrhosis) or kidney disease.  Are very overweight (obese).  Have a skin problem, such as: ? Itchy rash (eczema). ? Slow movement of blood in the veins (venous stasis). ? Fluid buildup below the skin (edema).  Have been treated with high-energy rays (radiation).  Use IV drugs. What are the signs or symptoms? Symptoms of this condition include:  Skin that is: ? Red. ? Streaking. ? Spotting. ? Swollen. ? Sore or painful when you touch it. ? Warm.  A fever.  Chills.  Blisters. How is this diagnosed? This condition is diagnosed based on:  Medical history.  Physical exam.  Blood tests.  Imaging tests. How is this treated? Treatment for this condition may include:  Medicines to treat infections or allergies.  Home care, such as: ? Rest. ? Placing cold or warm cloths (compresses) on the skin.  Hospital care, if the condition is very bad. Follow these instructions at home: Medicines  Take over-the-counter and prescription medicines only as told by your doctor.  If you were prescribed an antibiotic medicine, take it as told by your doctor. Do  not stop taking it even if you start to feel better. General instructions  Drink enough fluid to keep your pee (urine) pale yellow.  Do not touch or rub the infected area.  Raise (elevate) the infected area above the level of your heart while you are sitting or lying down.  Place cold or warm cloths on the area as told by your doctor.  Keep all follow-up visits as told by your doctor. This is important.   Contact a doctor if:  You have a fever.  You do not start to get better after 1-2 days of treatment.  Your bone or joint under the infected area starts to hurt after the skin has healed.  Your infection comes back. This can happen in the same area or another area.  You have a swollen bump in the area.  You have new symptoms.  You feel ill and have muscle aches and pains. Get help right away if:  Your symptoms get worse.  You feel very sleepy.  You throw up (vomit) or have watery poop (diarrhea) for a long time.  You see red streaks coming from the area.  Your red area gets larger.  Your red area turns dark in color. These symptoms may represent a serious problem that is an emergency. Do not wait to see if the symptoms will go away. Get medical help right away. Call your local emergency services (911 in the [place] Do not drive yourself to the hospital. Summary  Cellulitis is a  skin infection. The area is often warm, red, swollen, and sore.  This condition is treated with medicines, rest, and cold and warm cloths.  Take all medicines only as told by your doctor.  Tell your doctor if symptoms do not start to get better after 1-2 days of treatment. This information is not intended to replace advice given to you by your health care provider. Make sure you discuss any questions you have with your health care provider. Document Revised: 12/05/2017 Document Reviewed: 12/05/2017 Elsevier Patient Education  [place].

## 2020-12-14 NOTE — H&P (Signed)
.   Chief Complaint: Referred from PCPs office on account of left lower extremity cellulitis. HPI: Derek Vandermeulen Revard [name]. is an 70 y.o. 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  At baseline, patient ambulates with the use of crutches and has been doing well until about a month ago he noted left lower extremity soreness with episodes of skin peeling.  He admitted to intermittent episodes of subjective chills but no recorded fevers.  He has noted progressive worsening soreness in left lower extremity, hence decided to see PCP today and was referred to the ED for further evaluation for suspected cellulitis.  He denies any known history of peripheral vascular disease.  Denies any claudications.  He admitted to an arthrocentesis procedure done about the same time of onset of symptoms about a month ago.  In the ED, duplex ultrasound of left lower extremity was negative for DVT.  Work-up in the ED also revealed acute anemia from baseline hemoglobin of 11.8 in 2021.  Hemoglobin on this admission was 6.1.  He denied any bleeding diathesis.  Denied any hematemesis.  Denied any dark tarry stools.  Stool occult as per ED was positive for blood.  Patient scheduled for 2 units of PRBC transfusion.  Past Medical History:  Diagnosis Date  . 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 ANXIETY 03/24/2007  . Chronic pain   . CIRRHOSIS 03/24/2007  . 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 FRACTURE, WRIST, LEFT 10/12/2009   Qualifier: Diagnosis of  By: Loanne Drilling MD, Jacelyn Pi   . HEPATITIS C 03/24/2007  . HYPERLIPIDEMIA 09/12/2009  . Hypocalcemia 12/23/2007   Qualifier: Diagnosis of  By: Loanne Drilling MD, Jacelyn Pi   . Pancytopenia 12/23/2007  . 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 Squamous cell carcinoma of skin 05/21/2018   R mid volar forearm  .  Thyroid disease   . UROLITHIASIS, HX OF 12/23/2007    Past Surgical History:  Procedure Laterality Date  . ABOVE KNEE LEG AMPUTATION Right    Right femur fracture, 11/1994, resulted in right AKA 2006 after staph infection    Family History  Problem Relation Age of Onset  . Heart disease Mother   . Lung cancer Mother 3  . Heart attack Paternal Grandmother   . Heart attack Paternal Grandfather   . Liver cancer Neg Hx    Social History:  reports that he has never smoked. He has never used smokeless tobacco. He reports previous alcohol use. He reports that he does not use drugs.  Allergies:  Allergies  Allergen Reactions  . Clindamycin Dermatitis    Medications Prior to Admission  Medication Sig Dispense Refill  . ALPRAZolam (XANAX) 1 MG tablet Take 1 mg by mouth 3 (three) times daily.    . Cholecalciferol (VITAMIN D3) 10 MCG (400 UNIT) CAPS Take 4 capsules by mouth daily.    . folic acid (FOLVITE) 1 MG tablet TAKE 1 TABLET(1 MG) BY MOUTH DAILY 30 tablet 3  . hydrochlorothiazide (HYDRODIURIL) 12.5 MG tablet TAKE 1 TABLET(12.5 MG) BY MOUTH DAILY 90 tablet 1  . levothyroxine (SYNTHROID) 50 MCG tablet TAKE 1 TABLET(50 MCG) BY MOUTH DAILY BEFORE BREAKFAST 90 tablet 0  . losartan (COZAAR) 100 MG tablet TAKE 1 TABLET(100 MG) BY MOUTH DAILY 90 tablet 1  . lubiprostone (AMITIZA) 24 MCG capsule Take  24 mcg by mouth daily as needed.    . meloxicam (MOBIC) 15 MG tablet Take 1 tablet by mouth every morning.    Marland Kitchen morphine (MS CONTIN) 15 MG 12 hr tablet Take 15 mg by mouth 2 (two) times daily as needed.    Marland Kitchen oxycodone (ROXICODONE) 30 MG immediate release tablet Take 1 tablet by mouth 5 (five) times daily as needed.    . propranolol (INDERAL) 40 MG tablet TAKE 1 TABLET(40 MG) BY MOUTH TWICE DAILY 180 tablet 1  . vitamin B-12 (CYANOCOBALAMIN) 1000 MCG tablet Take 1 tablet (1,000 mcg total) by mouth daily.    Marland Kitchen NARCAN 4 MG/0.1ML LIQD nasal spray kit SPRAY 0.1 ML (4 MG) IN 1 NOSTRIL/ MAY REPEAT DOSE  EVERY 2 3 MINUTES AS NEEDED ALTERNATING NOSTRILS      Results for orders placed or performed during the hospital encounter of 12/14/20 (from the past 48 hour(s))  CBC     Status: Abnormal   Collection Time: 12/14/20 11:59 AM  Result Value Ref Range   WBC 3.5 (L) 4.0 - 10.5 K/uL   RBC 1.92 (L) 4.22 - 5.81 MIL/uL   Hemoglobin 6.1 (L) 13.0 - 17.0 g/dL   HCT 19.2 (L) 39.0 - 52.0 %   MCV 100.0 80.0 - 100.0 fL   MCH 31.8 26.0 - 34.0 pg   MCHC 31.8 30.0 - 36.0 g/dL   RDW 21.0 (H) 11.5 - 15.5 %   Platelets 42 (L) 150 - 400 K/uL    Comment: Immature Platelet Fraction may be clinically indicated, consider ordering this additional test [id number]    nRBC 0.0 0.0 - 0.2 %    Comment: Performed at [facility], [address] [place], [place] [address]  Basic metabolic panel     Status: Abnormal   Collection Time: 12/14/20 11:59 AM  Result Value Ref Range   Sodium 137 135 - 145 mmol/L   Potassium 4.4 3.5 - 5.1 mmol/L   Chloride 100 98 - 111 mmol/L   CO2 26 22 - 32 mmol/L   Glucose, Bld 114 (H) 70 - 99 mg/dL    Comment: Glucose reference range applies only to samples taken after fasting for at least 8 hours.   BUN 18 8 - 23 mg/dL   Creatinine, Ser 0.70 0.61 - 1.24 mg/dL   Calcium 9.5 8.9 - 10.3 mg/dL   GFR, Estimated >60 >60 mL/min    Comment: (NOTE) Calculated using the CKD-EPI Creatinine Equation (2021)    Anion gap 11 5 - 15    Comment: Performed at [facility], [address] [place], [place] [address]  Lactic acid, plasma     Status: None   Collection Time: 12/14/20 12:00 PM  Result Value Ref Range   Lactic Acid, Venous 1.6 0.5 - 1.9 mmol/L    Comment: Performed at [facility], [address] [place], [place] [address]  Lactic acid, plasma     Status: None   Collection Time: 12/14/20  1:49 PM  Result Value Ref Range   Lactic Acid, Venous 1.5 0.5 - 1.9 mmol/L    Comment: Performed at [facility], [address] [place], [place]  [address]  Prepare RBC (crossmatch)     Status: None (Preliminary result)   Collection Time: 12/14/20  2:05 PM  Result Value Ref Range   Order Confirmation PENDING   Resp Panel by RT-PCR (Flu A&B, Covid) Nasopharyngeal Swab     Status: None   Collection Time: 12/14/20  2:36 PM   Specimen: Nasopharyngeal Swab; Nasopharyngeal(NP) swabs in vial transport medium  Result Value Ref Range   SARS Coronavirus 2 by RT PCR NEGATIVE NEGATIVE    Comment: (NOTE) SARS-CoV-2 target nucleic acids are NOT DETECTED.  The SARS-CoV-2 RNA is generally detectable in upper respiratory specimens during the acute phase of infection. The lowest concentration of SARS-CoV-2 viral copies this assay can detect is 138 copies/mL. A negative result does not preclude SARS-Cov-2 infection and should not be used as the sole basis for treatment or other patient management decisions. A negative result may occur with  improper specimen collection/handling, submission of specimen other than nasopharyngeal swab, presence of viral mutation(s) within the areas targeted by this assay, and inadequate number of viral copies(<138 copies/mL). A negative result must be combined with clinical observations, patient history, and epidemiological information. The expected result is Negative.  Fact Sheet for Patients:  EntrepreneurPulse.com.au  Fact Sheet for Healthcare Providers:  IncredibleEmployment.be  This test is no t yet approved or cleared by the [place] FDA and  has been authorized for detection and/or diagnosis of SARS-CoV-2 by FDA under an Emergency Use Authorization (EUA). This EUA will remain  in effect (meaning this test can be used) for the duration of the COVID-19 declaration under Section 564(b)(1) of the Act, 21 U.S.C.section 360bbb-3(b)(1), unless the authorization is terminated  or revoked sooner.       Influenza A by PCR NEGATIVE NEGATIVE   Influenza B by PCR NEGATIVE  NEGATIVE    Comment: (NOTE) The Xpert Xpress SARS-CoV-2/FLU/RSV plus assay is intended as an aid in the diagnosis of influenza from Nasopharyngeal swab specimens and should not be used as a sole basis for treatment. Nasal washings and aspirates are unacceptable for Xpert Xpress SARS-CoV-2/FLU/RSV testing.  Fact Sheet for Patients: EntrepreneurPulse.com.au  Fact Sheet for Healthcare Providers: IncredibleEmployment.be  This test is not yet approved or cleared by the [place] FDA and has been authorized for detection and/or diagnosis of SARS-CoV-2 by FDA under an Emergency Use Authorization (EUA). This EUA will remain in effect (meaning this test can be used) for the duration of the COVID-19 declaration under Section 564(b)(1) of the Act, 21 U.S.C. section 360bbb-3(b)(1), unless the authorization is terminated or revoked.  Performed at [facility], [address] [place], [place] [address]   Type and screen     Status: None (Preliminary result)   Collection Time: 12/14/20  2:36 PM  Result Value Ref Range   ABO/RH(D) B NEG    Antibody Screen POS    Sample Expiration      12/17/2020,2359 Performed at [facility], [place], [place] [address]    Antibody Identification PENDING    US Venous Img Lower Unilateral Left  Result Date: 12/14/2020 CLINICAL DATA:  Swelling redness of the left leg. EXAM: LEFT LOWER EXTREMITY VENOUS DOPPLER ULTRASOUND TECHNIQUE: Gray-scale sonography with compression, as well as color and duplex ultrasound, were performed to evaluate the deep venous system(s) from the level of the common femoral vein through the popliteal and proximal calf veins. COMPARISON:  None. FINDINGS: VENOUS Normal compressibility of the common femoral, superficial femoral, and popliteal veins, as well as the visualized calf veins. Visualized portions of profunda femoral vein and great saphenous vein  unremarkable. No filling defects to suggest DVT on grayscale or color Doppler imaging. Doppler waveforms show normal direction of venous flow, normal respiratory plasticity and response to augmentation. Limited views of the contralateral common femoral vein are unremarkable.  IMPRESSION: No evidence of DVT. Electronically Signed   By: Margaretha Sheffield MD   On: 12/14/2020 14:03    Review of Systems  Musculoskeletal: Positive for back pain and joint swelling.  Skin: Positive for color change, rash and wound.  Psychiatric/Behavioral: Negative.   All other systems reviewed and are negative.   Blood pressure (!) 127/44, pulse 61, temperature 98.4 F (36.9 C), temperature source Oral, resp. rate 18, height 6' (1.829 m), weight 100 kg, SpO2 97 %. Physical Exam Vitals reviewed.  Constitutional:      Appearance: Normal appearance. He is obese.  HENT:     Head: Normocephalic.     Nose: Nose normal.     Mouth/Throat:     Mouth: Mucous membranes are moist.  Eyes:     Extraocular Movements: Extraocular movements intact.     Pupils: Pupils are equal, round, and reactive to light.  Cardiovascular:     Rate and Rhythm: Regular rhythm.     Pulses: Normal pulses.     Heart sounds: Normal heart sounds.  Abdominal:     General: Bowel sounds are normal.     Palpations: Abdomen is soft.  Musculoskeletal:     Cervical back: Normal range of motion and neck supple.     Comments: S/P right AKA  Skin:    General: Skin is warm.     Findings: Erythema and rash present.     Comments: LLE with swelling and warmth. Skin erosions and crusty rash. Some wounds noted with no obvious discharges appreciated at this time.  Neurological:     General: No focal deficit present.     Mental Status: He is alert and oriented to person, place, and time.  Psychiatric:        Mood and Affect: Mood normal.        Thought Content: Thought content normal.      Assessment/Plan  70 year old gentleman with history of  cirrhosis of the liver secondary to chronic hepatitis C infection.  Admitted on account of left lower extremity cellulitis and found to have hemoglobin of 6.1.  Suspected to be GI blood loss.  Cellulitis of the left lower extremity: Subacute.  Onset of symptoms about a month ago.  DVT was ruled out with duplex ultrasound on this admission.  Patient was initiated on IV antibiotics with vancomycin.  We will continue with 2 g Rocephin as per protocol.  Work-up for MRSA pending.  Elevate foot of bed.  Continue with analgesics.  Acute on chronic anemia: Patient has underlying cirrhosis of liver with chronic thrombocytopenia.  Denies any hematemesis or hematochezia.  Patient will be transfused with 2 units of PRBC.  GI will be consulted to evaluate for possible GI source of bleeding.  Hold all anticoagulation.  Chronic debility secondary to right AKA.  At baseline, patient ambulates with the use of crutches.  Physical therapy and outpatient therapy to work with patient.  History of cirrhosis of the liver: Most likely due to chronic hepatitis C.  Looks compensated.  No significant evidence of fluid overload.  We will continue to monitor for now.  GI inputs pending.  Thrombocytopenia: Most likely secondary to chronic liver disease.  No obvious active bleeding diathesis noted at this time.  Will monitor platelet count daily.    [place], MD 12/14/2020, 4:57 PM

## 2020-12-14 NOTE — ED Triage Notes (Signed)
Pt comes with c/o left leg pain that started months ago. Pt states it has gotten worse. Pt states infection and was sent here for IV meds per MD.  Pt has redness, swelling and drainage.

## 2020-12-15 ENCOUNTER — Encounter: Payer: Self-pay | Admitting: Internal Medicine

## 2020-12-15 DIAGNOSIS — K7460 Unspecified cirrhosis of liver: Secondary | ICD-10-CM

## 2020-12-15 DIAGNOSIS — K766 Portal hypertension: Secondary | ICD-10-CM

## 2020-12-15 DIAGNOSIS — L03116 Cellulitis of left lower limb: Secondary | ICD-10-CM | POA: Diagnosis not present

## 2020-12-15 DIAGNOSIS — B182 Chronic viral hepatitis C: Secondary | ICD-10-CM | POA: Diagnosis not present

## 2020-12-15 DIAGNOSIS — D649 Anemia, unspecified: Secondary | ICD-10-CM | POA: Diagnosis not present

## 2020-12-15 DIAGNOSIS — D696 Thrombocytopenia, unspecified: Secondary | ICD-10-CM

## 2020-12-15 DIAGNOSIS — D61818 Other pancytopenia: Secondary | ICD-10-CM | POA: Diagnosis not present

## 2020-12-15 LAB — CBC WITH DIFFERENTIAL/PLATELET
Abs Immature Granulocytes: 0.04 10*3/uL (ref 0.00–0.07)
Basophils Absolute: 0 10*3/uL (ref 0.0–0.1)
Basophils Relative: 1 %
Eosinophils Absolute: 0.1 10*3/uL (ref 0.0–0.5)
Eosinophils Relative: 4 %
HCT: 26 % — ABNORMAL LOW (ref 39.0–52.0)
Hemoglobin: 8.5 g/dL — ABNORMAL LOW (ref 13.0–17.0)
Immature Granulocytes: 1 %
Lymphocytes Relative: 28 %
Lymphs Abs: 0.9 10*3/uL (ref 0.7–4.0)
MCH: 30.8 pg (ref 26.0–34.0)
MCHC: 32.7 g/dL (ref 30.0–36.0)
MCV: 94.2 fL (ref 80.0–100.0)
Monocytes Absolute: 0.3 10*3/uL (ref 0.1–1.0)
Monocytes Relative: 8 %
Neutro Abs: 2 10*3/uL (ref 1.7–7.7)
Neutrophils Relative %: 58 %
Platelets: 40 10*3/uL — ABNORMAL LOW (ref 150–400)
RBC: 2.76 MIL/uL — ABNORMAL LOW (ref 4.22–5.81)
RDW: 22 % — ABNORMAL HIGH (ref 11.5–15.5)
WBC: 3.4 10*3/uL — ABNORMAL LOW (ref 4.0–10.5)
nRBC: 0 % (ref 0.0–0.2)

## 2020-12-15 LAB — BASIC METABOLIC PANEL
Anion gap: 8 (ref 5–15)
BUN: 19 mg/dL (ref 8–23)
CO2: 26 mmol/L (ref 22–32)
Calcium: 8.8 mg/dL — ABNORMAL LOW (ref 8.9–10.3)
Chloride: 104 mmol/L (ref 98–111)
Creatinine, Ser: 0.72 mg/dL (ref 0.61–1.24)
GFR, Estimated: >60 mL/min (ref >60)
Glucose, Bld: 111 mg/dL — ABNORMAL HIGH (ref 70–99)
Potassium: 4 mmol/L (ref 3.5–5.1)
Sodium: 138 mmol/L (ref 135–145)

## 2020-12-15 LAB — PROTIME-INR
INR: 1.2 (ref 0.8–1.2)
Prothrombin Time: 15.5 seconds — ABNORMAL HIGH (ref 11.4–15.2)

## 2020-12-15 LAB — HEMOGLOBIN AND HEMATOCRIT, BLOOD
HCT: 20.4 % — ABNORMAL LOW (ref 39.0–52.0)
Hemoglobin: 6.7 g/dL — ABNORMAL LOW (ref 13.0–17.0)

## 2020-12-15 LAB — CBC
HCT: 20.9 % — ABNORMAL LOW (ref 39.0–52.0)
Hemoglobin: 6.9 g/dL — ABNORMAL LOW (ref 13.0–17.0)
MCH: 31.4 pg (ref 26.0–34.0)
MCHC: 33 g/dL (ref 30.0–36.0)
MCV: 95 fL (ref 80.0–100.0)
Platelets: 27 10*3/uL — CL (ref 150–400)
RBC: 2.2 MIL/uL — ABNORMAL LOW (ref 4.22–5.81)
RDW: 22.9 % — ABNORMAL HIGH (ref 11.5–15.5)
WBC: 2.2 10*3/uL — ABNORMAL LOW (ref 4.0–10.5)
nRBC: 0 % (ref 0.0–0.2)

## 2020-12-15 LAB — PREPARE RBC (CROSSMATCH)

## 2020-12-15 MED ORDER — DOXYCYCLINE HYCLATE 100 MG PO TABS
100.0000 mg | ORAL_TABLET | Freq: Two times a day (BID) | ORAL | Status: DC
  Administered 2020-12-15 – 2020-12-18 (×7): 100 mg via ORAL
  Filled 2020-12-15 (×7): qty 1

## 2020-12-15 MED ORDER — FUROSEMIDE 10 MG/ML IJ SOLN
40.0000 mg | Freq: Once | INTRAMUSCULAR | Status: AC
  Administered 2020-12-15: 40 mg via INTRAVENOUS
  Filled 2020-12-15: qty 4

## 2020-12-15 MED ORDER — SODIUM CHLORIDE 0.9% IV SOLUTION: Freq: Once | INTRAVENOUS | Status: DC

## 2020-12-15 MED ORDER — PEG 3350-KCL-NA BICARB-NACL 420 G PO SOLR
4000.0000 mL | Freq: Once | ORAL | Status: AC
  Administered 2020-12-15: 4000 mL via ORAL
  Filled 2020-12-15: qty 4000

## 2020-12-15 MED ORDER — BISACODYL 5 MG PO TBEC
10.0000 mg | DELAYED_RELEASE_TABLET | Freq: Once | ORAL | Status: AC
  Administered 2020-12-15: 10 mg via ORAL
  Filled 2020-12-15: qty 2

## 2020-12-15 NOTE — Plan of Care (Signed)

## 2020-12-15 NOTE — Progress Notes (Signed)
Derek Antigua, MD [address], [place], [place], [place], [address] [address], [place], [place], [place], [address] Phone: [phone]  Fax: [phone]   Subjective: Patient denies any episodes of active GI bleeding.  No occult blood test was positive in the ER.  Hemoglobin remains low today at 6.7 despite PRBC transfusion yesterday   Objective: Exam: Vital signs in last 24 hours: Vitals:   12/15/20 1455 12/15/20 1458 12/15/20 1512 12/15/20 1527  BP:  (!) 132/53 (!) 127/57 (!) 133/57  Pulse:  60 (!) 59 61  Resp:  18 18 17   Temp:  98.1 F (36.7 C) 97.8 F (36.6 C) 97.7 F (36.5 C)  TempSrc: Oral Oral  Oral  SpO2:  99% 100% 98%  Weight:      Height:       Weight change:   Intake/Output Summary (Last 24 hours) at 12/15/2020 1555 Last data filed at 12/15/2020 1455 Gross per 24 hour  Intake 2822.25 ml  Output 2150 ml  Net 672.25 ml    General: No acute distress, AAO x3 Abd: Soft, NT/ND, No HSM Skin: Warm, no rashes Neck: Supple, Trachea midline   Lab Results: Lab Results  Component Value Date   WBC 2.2 (L) 12/15/2020   HGB 6.7 (L) 12/15/2020   HCT 20.4 (L) 12/15/2020   MCV 95.0 12/15/2020   PLT 27 (LL) 12/15/2020   Micro Results: Recent Results (from the past 240 hour(s))  Blood culture (routine x 2)     Status: None (Preliminary result)   Collection Time: 12/14/20 12:15 PM   Specimen: BLOOD  Result Value Ref Range Status   Specimen Description BLOOD RIGHT AC  Final   Special Requests   Final    BOTTLES DRAWN AEROBIC AND ANAEROBIC Blood Culture results may not be optimal due to an inadequate volume of blood received in culture bottles   Culture   Final    NO GROWTH < 24 HOURS Performed at [facility], [address] [place], [place] [address]    Report Status PENDING  Incomplete  Blood culture (routine x 2)     Status: None (Preliminary result)   Collection Time: 12/14/20  1:07 PM   Specimen: BLOOD  Result Value Ref Range  Status   Specimen Description BLOOD LEFT AC  Final   Special Requests   Final    BOTTLES DRAWN AEROBIC AND ANAEROBIC Blood Culture results may not be optimal due to an inadequate volume of blood received in culture bottles   Culture   Final    NO GROWTH < 24 HOURS Performed at [facility], [address] [place], [place] [address]    Report Status PENDING  Incomplete  Resp Panel by RT-PCR (Flu A&B, Covid) Nasopharyngeal Swab     Status: None   Collection Time: 12/14/20  2:36 PM   Specimen: Nasopharyngeal Swab; Nasopharyngeal(NP) swabs in vial transport medium  Result Value Ref Range Status   SARS Coronavirus 2 by RT PCR NEGATIVE NEGATIVE Final    Comment: (NOTE) SARS-CoV-2 target nucleic acids are NOT DETECTED.  The SARS-CoV-2 RNA is generally detectable in upper respiratory specimens during the acute phase of infection. The lowest concentration of SARS-CoV-2 viral copies this assay can detect is 138 copies/mL. A negative result does not preclude SARS-Cov-2 infection and should not be used as the sole basis for treatment or other patient management decisions. A negative result may occur with  improper specimen collection/handling, submission of specimen other than nasopharyngeal swab,  presence of viral mutation(s) within the areas targeted by this assay, and inadequate number of viral copies(<138 copies/mL). A negative result must be combined with clinical observations, patient history, and epidemiological information. The expected result is Negative.  Fact Sheet for Patients:  EntrepreneurPulse.com.au  Fact Sheet for Healthcare Providers:  IncredibleEmployment.be  This test is no t yet approved or cleared by the [place] FDA and  has been authorized for detection and/or diagnosis of SARS-CoV-2 by FDA under an Emergency Use Authorization (EUA). This EUA will remain  in effect (meaning this test can be used) for the duration of  the COVID-19 declaration under Section 564(b)(1) of the Act, 21 U.S.C.section 360bbb-3(b)(1), unless the authorization is terminated  or revoked sooner.       Influenza A by PCR NEGATIVE NEGATIVE Final   Influenza B by PCR NEGATIVE NEGATIVE Final    Comment: (NOTE) The Xpert Xpress SARS-CoV-2/FLU/RSV plus assay is intended as an aid in the diagnosis of influenza from Nasopharyngeal swab specimens and should not be used as a sole basis for treatment. Nasal washings and aspirates are unacceptable for Xpert Xpress SARS-CoV-2/FLU/RSV testing.  Fact Sheet for Patients: EntrepreneurPulse.com.au  Fact Sheet for Healthcare Providers: IncredibleEmployment.be  This test is not yet approved or cleared by the [place] FDA and has been authorized for detection and/or diagnosis of SARS-CoV-2 by FDA under an Emergency Use Authorization (EUA). This EUA will remain in effect (meaning this test can be used) for the duration of the COVID-19 declaration under Section 564(b)(1) of the Act, 21 U.S.C. section 360bbb-3(b)(1), unless the authorization is terminated or revoked.  Performed at [facility], [address] [place], [place] [address]    Studies/Results: US Venous Img Lower Unilateral Left  Result Date: 12/14/2020 CLINICAL DATA:  Swelling redness of the left leg. EXAM: LEFT LOWER EXTREMITY VENOUS DOPPLER ULTRASOUND TECHNIQUE: Gray-scale sonography with compression, as well as color and duplex ultrasound, were performed to evaluate the deep venous system(s) from the level of the common femoral vein through the popliteal and proximal calf veins. COMPARISON:  None. FINDINGS: VENOUS Normal compressibility of the common femoral, superficial femoral, and popliteal veins, as well as the visualized calf veins. Visualized portions of profunda femoral vein and great saphenous vein unremarkable. No filling defects to suggest DVT on grayscale or color  Doppler imaging. Doppler waveforms show normal direction of venous flow, normal respiratory plasticity and response to augmentation. Limited views of the contralateral common femoral vein are unremarkable. IMPRESSION: No evidence of DVT. Electronically Signed   By: Margaretha Sheffield MD   On: 12/14/2020 14:03   Medications:  Scheduled Meds: . sodium chloride   Intravenous Once  . ALPRAZolam  1 mg Oral TID  . bisacodyl  10 mg Oral Once  . doxycycline  100 mg Oral Q12H  . levothyroxine  50 mcg Oral Q0600  . losartan  25 mg Oral Daily  . morphine  15 mg Oral Q12H  . multivitamin with minerals  1 tablet Oral Daily  . pantoprazole (PROTONIX) IV  40 mg Intravenous Q12H  . polyethylene glycol-electrolytes  4,000 mL Oral Once  . propranolol  20 mg Oral BID  . senna  1 tablet Oral BID   Continuous Infusions: . sodium chloride Stopped (12/14/20 1425)  . cefTRIAXone (ROCEPHIN)  IV 2 g (12/14/20 1849)   PRN Meds:.albuterol, hydrALAZINE, lubiprostone, naloxone, oxycodone   Assessment: Active Problems:   Pancytopenia (HCC)   Portal venous hypertension (HCC)   Hepatic cirrhosis due to chronic  hepatitis C infection ([place])   Cellulitis of left lower extremity    Plan: Given persistent anemia despite PRBC transfusion, stool occult blood test positive, and this being the lowest hemoglobin patient has had in the past, EGD and colonoscopy indicated for ongoing anemia, requiring multiple PRBC transfusions  Patient's platelet count is 27,000 today and platelet transfusion is underway.  I discussed with Dr. Roosevelt Locks that platelet count will need to be 50,000 or above prior to endoscopy and he states that repeat is pending for tomorrow morning  Once patient is optimized from hemoglobin and platelet standpoint, can proceed with EGD and colonoscopy for evaluation of acute on chronic anemia, FOBT positive.  Please also note patient is past due for his variceal screening and polyp surveillance colonoscopies as  well  I did discuss several options with the patient's including inpatient EGD and colonoscopy, versus outpatient work-up, information prefers to get this done as an inpatient.  Patient has right AKA and states he is not going to be able to take the prep at home and make it to the bathroom with multiple bowel movements with the prep and would rather get it done as an inpatient if possible as well.   I have discussed alternative options, risks & benefits,  which include, but are not limited to, bleeding, infection, perforation,respiratory complication & drug reaction.  The patient agrees with this plan & written consent will be obtained.       LOS: 1 day   Derek Antigua, MD 12/15/2020, 3:55 PM

## 2020-12-15 NOTE — Progress Notes (Signed)
PROGRESS NOTE    Derek Anon Lank Jr.  CHE:[id number] DOB: 12-31-1950 DOA: 12/14/2020 PCP: [name], FNP   Chief complaint.  Left lower extremity redness. Brief Narrative:  [name]. is an 70 y.o. 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  He reported progressively worsening pain in the left lower extremity.  Patient was started on Rocephin.  Patient also had acute on chronic anemia with chronic thrombocytopenia, he received 2 units of PRBC.   Assessment & Plan:   Active Problems:   Cellulitis of left lower extremity  #1.  Cellulitis of the left lower extremity. No DVT.  Patient only has some mild redness today.  Continue Rocephin, added doxycycline.  #2.  Pancytopenia. Acute on chronic symptomatic anemia. Liver cirrhosis with portal hypertension. Has been evaluated by GI for concerning of possible GI bleed from portal hypertension.  He received 2 units of PRBC yesterday, hemoglobin today 6.7, will give another unit PRBC.  Give Lasix 40 mg IV after transfusion. Due to concern of bleed, will transfuse 1 unit of platelets for platelet count of 27. Also started IV Protonix.  #3.  Hypothyroidism. TSH 8.0.  Synthroid started.    DVT prophylaxis: SCDs Code Status: Full Family Communication:  Disposition Plan:  .   Status is: Inpatient  Remains inpatient appropriate because:Inpatient level of care appropriate due to severity of illness   Dispo: The patient is from: Home              Anticipated d/c is to: Home              Patient currently is not medically stable to d/c.   Difficult to place patient No        I/O last 3 completed shifts: In: 1599.3 [Blood:1599.3] Out: 1700 [Urine:1700] No intake/output data recorded.     Consultants:   GI  Procedures: None  Antimicrobials:  Rocephin and  doxycycline.  Subjective: Patient states that her left leg pain is better today.  He did not notice any black stool or rectal bleeding. No abdominal pain. Denies any short of breath or cough. No dysuria hematuria No fever or chills.   Objective: Vitals:   12/15/20 0248 12/15/20 0256 12/15/20 0632 12/15/20 0824  BP: (!) 114/45 (!) 114/45 (!) 131/56 134/69  Pulse: 62 62 61 61  Resp: 17 17 17 20   Temp: 98.4 F (36.9 C) 98.4 F (36.9 C) (!) 97.4 F (36.3 C) 98 F (36.7 C)  TempSrc: Oral Oral Oral   SpO2:  97% 99% 99%  Weight:      Height:        Intake/Output Summary (Last 24 hours) at 12/15/2020 1100 Last data filed at 12/15/2020 0635 Gross per 24 hour  Intake 1599.25 ml  Output 1700 ml  Net -100.75 ml   Filed Weights   12/14/20 1211  Weight: 100 kg    Examination:  General exam: Appears calm and comfortable  Respiratory system: Clear to auscultation. Respiratory effort normal. Cardiovascular system: S1 & S2 heard, RRR. No JVD, murmurs, rubs, gallops or clicks. No pedal edema. Gastrointestinal system: Abdomen is nondistended, soft and nontender. No organomegaly or masses felt. Normal bowel sounds heard. Central nervous system: Alert and oriented. No focal neurological deficits. Extremities: Right AKA, left leg mild edema with redness. Skin: No rashes, lesions or ulcers Psychiatry:  Mood & affect appropriate.  Data Reviewed: I have personally reviewed following labs and imaging studies  CBC: Recent Labs  Lab 12/14/20 1159 12/14/20 1800 12/15/20 0516 12/15/20 0859  WBC 3.5*  --  2.2*  --   HGB 6.1* 6.7* 6.9* 6.7*  HCT 19.2* 20.4* 20.9* 20.4*  MCV 100.0  --  95.0  --   PLT 42*  --  27*  --    Basic Metabolic Panel: Recent Labs  Lab 12/14/20 1159 12/15/20 0516  NA 137 138  K 4.4 4.0  CL 100 104  CO2 26 26  GLUCOSE 114* 111*  BUN 18 19  CREATININE 0.70 0.72  CALCIUM 9.5 8.8*   GFR: Estimated Creatinine Clearance: 103.7 mL/min (by C-G formula  based on SCr of 0.72 mg/dL). Liver Function Tests: No results for input(s): AST, ALT, ALKPHOS, BILITOT, PROT, ALBUMIN in the last 168 hours. No results for input(s): LIPASE, AMYLASE in the last 168 hours. No results for input(s): AMMONIA in the last 168 hours. Coagulation Profile: No results for input(s): INR, PROTIME in the last 168 hours. Cardiac Enzymes: No results for input(s): CKTOTAL, CKMB, CKMBINDEX, TROPONINI in the last 168 hours. BNP (last 3 results) No results for input(s): PROBNP in the last 8760 hours. HbA1C: No results for input(s): HGBA1C in the last 72 hours. CBG: No results for input(s): GLUCAP in the last 168 hours. Lipid Profile: No results for input(s): CHOL, HDL, LDLCALC, TRIG, CHOLHDL, LDLDIRECT in the last 72 hours. Thyroid Function Tests: Recent Labs    12/14/20 1003  TSH 8.00*   Anemia Panel: No results for input(s): VITAMINB12, FOLATE, FERRITIN, TIBC, IRON, RETICCTPCT in the last 72 hours. Sepsis Labs: Recent Labs  Lab 12/14/20 1200 12/14/20 1349  LATICACIDVEN 1.6 1.5    Recent Results (from the past 240 hour(s))  Blood culture (routine x 2)     Status: None (Preliminary result)   Collection Time: 12/14/20 12:15 PM   Specimen: BLOOD  Result Value Ref Range Status   Specimen Description BLOOD RIGHT [facility]  Final   Special Requests   Final    BOTTLES DRAWN AEROBIC AND ANAEROBIC Blood Culture results may not be optimal due to an inadequate volume of blood received in culture bottles   Culture   Final    NO GROWTH < 24 HOURS Performed at [facility], [address], [place], [place] [address]    Report Status PENDING  Incomplete  Blood culture (routine x 2)     Status: None (Preliminary result)   Collection Time: 12/14/20  1:07 PM   Specimen: BLOOD  Result Value Ref Range Status   Specimen Description BLOOD LEFT AC  Final   Special Requests   Final    BOTTLES DRAWN AEROBIC AND ANAEROBIC Blood Culture results may not be optimal due to an  inadequate volume of blood received in culture bottles   Culture   Final    NO GROWTH < 24 HOURS Performed at [facility], [address], [place], [place] [address]    Report Status PENDING  Incomplete  Resp Panel by RT-PCR (Flu A&B, Covid) Nasopharyngeal Swab     Status: None   Collection Time: 12/14/20  2:36 PM   Specimen: Nasopharyngeal Swab; Nasopharyngeal(NP) swabs in vial transport medium  Result Value Ref Range Status   SARS Coronavirus 2 by RT PCR NEGATIVE NEGATIVE Final    Comment: (NOTE) SARS-CoV-2 target nucleic acids are NOT DETECTED.  The SARS-CoV-2 RNA is generally detectable in upper respiratory specimens during the acute  phase of infection. The lowest concentration of SARS-CoV-2 viral copies this assay can detect is 138 copies/mL. A negative result does not preclude SARS-Cov-2 infection and should not be used as the sole basis for treatment or other patient management decisions. A negative result may occur with  improper specimen collection/handling, submission of specimen other than nasopharyngeal swab, presence of viral mutation(s) within the areas targeted by this assay, and inadequate number of viral copies(<138 copies/mL). A negative result must be combined with clinical observations, patient history, and epidemiological information. The expected result is Negative.  Fact Sheet for Patients:  EntrepreneurPulse.com.au  Fact Sheet for Healthcare Providers:  IncredibleEmployment.be  This test is no t yet approved or cleared by the [place] FDA and  has been authorized for detection and/or diagnosis of SARS-CoV-2 by FDA under an Emergency Use Authorization (EUA). This EUA will remain  in effect (meaning this test can be used) for the duration of the COVID-19 declaration under Section 564(b)(1) of the Act, 21 U.S.C.section 360bbb-3(b)(1), unless the authorization is terminated  or revoked sooner.        Influenza A by PCR NEGATIVE NEGATIVE Final   Influenza B by PCR NEGATIVE NEGATIVE Final    Comment: (NOTE) The Xpert Xpress SARS-CoV-2/FLU/RSV plus assay is intended as an aid in the diagnosis of influenza from Nasopharyngeal swab specimens and should not be used as a sole basis for treatment. Nasal washings and aspirates are unacceptable for Xpert Xpress SARS-CoV-2/FLU/RSV testing.  Fact Sheet for Patients: EntrepreneurPulse.com.au  Fact Sheet for Healthcare Providers: IncredibleEmployment.be  This test is not yet approved or cleared by the [place] FDA and has been authorized for detection and/or diagnosis of SARS-CoV-2 by FDA under an Emergency Use Authorization (EUA). This EUA will remain in effect (meaning this test can be used) for the duration of the COVID-19 declaration under Section 564(b)(1) of the Act, 21 U.S.C. section 360bbb-3(b)(1), unless the authorization is terminated or revoked.  Performed at [facility], [address] [place], [place] [address]          Radiology Studies: US Venous Img Lower Unilateral Left  Result Date: 12/14/2020 CLINICAL DATA:  Swelling redness of the left leg. EXAM: LEFT LOWER EXTREMITY VENOUS DOPPLER ULTRASOUND TECHNIQUE: Gray-scale sonography with compression, as well as color and duplex ultrasound, were performed to evaluate the deep venous system(s) from the level of the common femoral vein through the popliteal and proximal calf veins. COMPARISON:  None. FINDINGS: VENOUS Normal compressibility of the common femoral, superficial femoral, and popliteal veins, as well as the visualized calf veins. Visualized portions of profunda femoral vein and great saphenous vein unremarkable. No filling defects to suggest DVT on grayscale or color Doppler imaging. Doppler waveforms show normal direction of venous flow, normal respiratory plasticity and response to augmentation. Limited views of the  contralateral common femoral vein are unremarkable. IMPRESSION: No evidence of DVT. Electronically Signed   By: Margaretha Sheffield MD   On: 12/14/2020 14:03        Scheduled Meds: . sodium chloride   Intravenous Once  . ALPRAZolam  1 mg Oral TID  . furosemide  40 mg Intravenous Once  . levothyroxine  50 mcg Oral Q0600  . losartan  25 mg Oral Daily  . morphine  15 mg Oral Q12H  . multivitamin with minerals  1 tablet Oral Daily  . pantoprazole (PROTONIX) IV  40 mg Intravenous Q12H  . propranolol  20 mg Oral BID  . senna  1 tablet Oral  BID   Continuous Infusions: . sodium chloride Stopped (12/14/20 1425)  . cefTRIAXone (ROCEPHIN)  IV 2 g (12/14/20 1849)     LOS: 1 day    Time spent: 32 minutes    Sharen Hones, MD [facility]   To contact the attending provider between 7A-7P or the covering provider during after hours 7P-7A, please log into the web site [email or web address] and access using universal [place] password for that web site. If you do not have the password, please call the hospital operator.  12/15/2020, 11:00 AM

## 2020-12-15 NOTE — Care Management Important Message (Signed)
Important Message  Patient Details  Name: [name]. MRN: [id number] Date of Birth: [birth year]   Medicare Important Message Given:  N/A - LOS <3 / Initial given by admissions  Initial Medicare IM reviewed with patient by Thornton Dales, Patient Access Associate on 12/15/2020 at 9:48am.     Dannette Barbara 12/15/2020, 2:53 PM

## 2020-12-16 ENCOUNTER — Encounter
Admission: EM | Disposition: A | Discharge status: Home Health | Payer: Self-pay | Source: Home / Self Care | Attending: Internal Medicine

## 2020-12-16 ENCOUNTER — Inpatient Hospital Stay: Payer: Medicare Other | Admitting: Anesthesiology

## 2020-12-16 ENCOUNTER — Encounter: Payer: Self-pay | Admitting: Internal Medicine

## 2020-12-16 ENCOUNTER — Ambulatory Visit: Payer: Medicare Other | Admitting: Family

## 2020-12-16 DIAGNOSIS — D61818 Other pancytopenia: Secondary | ICD-10-CM

## 2020-12-16 DIAGNOSIS — B182 Chronic viral hepatitis C: Secondary | ICD-10-CM | POA: Diagnosis not present

## 2020-12-16 DIAGNOSIS — K635 Polyp of colon: Secondary | ICD-10-CM

## 2020-12-16 DIAGNOSIS — D649 Anemia, unspecified: Secondary | ICD-10-CM | POA: Diagnosis not present

## 2020-12-16 DIAGNOSIS — K3189 Other diseases of stomach and duodenum: Secondary | ICD-10-CM | POA: Diagnosis not present

## 2020-12-16 DIAGNOSIS — D589 Hereditary hemolytic anemia, unspecified: Secondary | ICD-10-CM

## 2020-12-16 DIAGNOSIS — D62 Acute posthemorrhagic anemia: Secondary | ICD-10-CM

## 2020-12-16 DIAGNOSIS — K7460 Unspecified cirrhosis of liver: Secondary | ICD-10-CM | POA: Diagnosis not present

## 2020-12-16 DIAGNOSIS — L03116 Cellulitis of left lower limb: Secondary | ICD-10-CM | POA: Diagnosis not present

## 2020-12-16 HISTORY — PX: ESOPHAGOGASTRODUODENOSCOPY: SHX5428

## 2020-12-16 HISTORY — PX: COLONOSCOPY: SHX5424

## 2020-12-16 LAB — PREPARE PLATELET PHERESIS: Unit division: 0

## 2020-12-16 LAB — CBC WITH DIFFERENTIAL/PLATELET
Abs Immature Granulocytes: 0.03 10*3/uL (ref 0.00–0.07)
Basophils Absolute: 0 10*3/uL (ref 0.0–0.1)
Basophils Relative: 0 %
Eosinophils Absolute: 0.1 10*3/uL (ref 0.0–0.5)
Eosinophils Relative: 4 %
HCT: 24.1 % — ABNORMAL LOW (ref 39.0–52.0)
Hemoglobin: 8 g/dL — ABNORMAL LOW (ref 13.0–17.0)
Immature Granulocytes: 1 %
Lymphocytes Relative: 24 %
Lymphs Abs: 0.7 10*3/uL (ref 0.7–4.0)
MCH: 31 pg (ref 26.0–34.0)
MCHC: 33.2 g/dL (ref 30.0–36.0)
MCV: 93.4 fL (ref 80.0–100.0)
Monocytes Absolute: 0.2 10*3/uL (ref 0.1–1.0)
Monocytes Relative: 7 %
Neutro Abs: 1.7 10*3/uL (ref 1.7–7.7)
Neutrophils Relative %: 64 %
Platelets: 41 10*3/uL — ABNORMAL LOW (ref 150–400)
RBC: 2.58 MIL/uL — ABNORMAL LOW (ref 4.22–5.81)
RDW: 22.1 % — ABNORMAL HIGH (ref 11.5–15.5)
Smear Review: NORMAL
WBC: 2.7 10*3/uL — ABNORMAL LOW (ref 4.0–10.5)
nRBC: 0 % (ref 0.0–0.2)

## 2020-12-16 LAB — BPAM PLATELET PHERESIS
Blood Product Expiration Date: 202205202359
ISSUE DATE / TIME: 202205191200
Unit Type and Rh: 9500

## 2020-12-16 LAB — BASIC METABOLIC PANEL
Anion gap: 10 (ref 5–15)
BUN: 14 mg/dL (ref 8–23)
CO2: 27 mmol/L (ref 22–32)
Calcium: 9 mg/dL (ref 8.9–10.3)
Chloride: 97 mmol/L — ABNORMAL LOW (ref 98–111)
Creatinine, Ser: 0.76 mg/dL (ref 0.61–1.24)
GFR, Estimated: >60 mL/min (ref >60)
Glucose, Bld: 95 mg/dL (ref 70–99)
Potassium: 3.8 mmol/L (ref 3.5–5.1)
Sodium: 134 mmol/L — ABNORMAL LOW (ref 135–145)

## 2020-12-16 LAB — PHOSPHORUS: Phosphorus: 3.3 mg/dL (ref 2.5–4.6)

## 2020-12-16 LAB — MAGNESIUM: Magnesium: 1.5 mg/dL — ABNORMAL LOW (ref 1.7–2.4)

## 2020-12-16 LAB — PLATELET COUNT
Platelets: 45 10*3/uL — ABNORMAL LOW (ref 150–400)
Platelets: 39 10*3/uL — ABNORMAL LOW (ref 150–400)

## 2020-12-16 SURGERY — EGD (ESOPHAGOGASTRODUODENOSCOPY)
Anesthesia: General

## 2020-12-16 MED ORDER — FENTANYL CITRATE (PF) 100 MCG/2ML IJ SOLN: 25.0000 ug | INTRAMUSCULAR | Status: DC | PRN

## 2020-12-16 MED ORDER — PROPOFOL 500 MG/50ML IV EMUL
INTRAVENOUS | Status: AC
  Filled 2020-12-16: qty 50

## 2020-12-16 MED ORDER — ONDANSETRON HCL 4 MG/2ML IJ SOLN: 4.0000 mg | Freq: Once | INTRAMUSCULAR | Status: DC | PRN

## 2020-12-16 MED ORDER — SODIUM CHLORIDE 0.9 % IV SOLN
3.0000 g | Freq: Four times a day (QID) | INTRAVENOUS | Status: DC
  Administered 2020-12-16 – 2020-12-18 (×7): 3 g via INTRAVENOUS
  Filled 2020-12-16 (×2): qty 8
  Filled 2020-12-16: qty 3
  Filled 2020-12-16 (×3): qty 8
  Filled 2020-12-16: qty 3
  Filled 2020-12-16 (×5): qty 8

## 2020-12-16 MED ORDER — SODIUM CHLORIDE 0.9 % IV SOLN
INTRAVENOUS | Status: DC
  Administered 2020-12-16: 20 mL/h via INTRAVENOUS

## 2020-12-16 MED ORDER — PROPOFOL 500 MG/50ML IV EMUL
INTRAVENOUS | Status: DC | PRN
  Administered 2020-12-16: 175 ug/kg/min via INTRAVENOUS

## 2020-12-16 MED ORDER — MAGNESIUM SULFATE 2 GM/50ML IV SOLN
2.0000 g | Freq: Once | INTRAVENOUS | Status: AC
  Administered 2020-12-16: 2 g via INTRAVENOUS
  Filled 2020-12-16: qty 50

## 2020-12-16 MED ORDER — LIDOCAINE HCL (PF) 2 % IJ SOLN
INTRAMUSCULAR | Status: AC
  Filled 2020-12-16: qty 2

## 2020-12-16 MED ORDER — DEXMEDETOMIDINE (PRECEDEX) IN NS 20 MCG/5ML (4 MCG/ML) IV SYRINGE
PREFILLED_SYRINGE | INTRAVENOUS | Status: DC | PRN
  Administered 2020-12-16: 20 ug via INTRAVENOUS

## 2020-12-16 MED ORDER — FUROSEMIDE 40 MG PO TABS
40.0000 mg | ORAL_TABLET | Freq: Every day | ORAL | Status: DC
  Administered 2020-12-16 – 2020-12-18 (×3): 40 mg via ORAL
  Filled 2020-12-16 (×3): qty 1

## 2020-12-16 MED ORDER — PROPOFOL 10 MG/ML IV BOLUS
INTRAVENOUS | Status: DC | PRN
  Administered 2020-12-16: 90 mg via INTRAVENOUS

## 2020-12-16 MED ORDER — SODIUM CHLORIDE 0.9% IV SOLUTION: Freq: Once | INTRAVENOUS | Status: DC

## 2020-12-16 MED ORDER — EPHEDRINE SULFATE 50 MG/ML IJ SOLN
INTRAMUSCULAR | Status: DC | PRN
  Administered 2020-12-16 (×2): 10 mg via INTRAVENOUS

## 2020-12-16 NOTE — Anesthesia Postprocedure Evaluation (Signed)
Anesthesia Post Note  Patient: Derek Hedglin Mcmahen Jr.  Procedure(s) Performed: ESOPHAGOGASTRODUODENOSCOPY (EGD) (N/A ) COLONOSCOPY (N/A )  Patient location during evaluation: PACU Anesthesia Type: General Level of consciousness: awake and alert and oriented Pain management: pain level controlled Vital Signs Assessment: post-procedure vital signs reviewed and stable Respiratory status: spontaneous breathing Cardiovascular status: blood pressure returned to baseline Anesthetic complications: no   No complications documented.   Last Vitals:  Vitals:   12/16/20 1217 12/16/20 1339  BP: (!) 128/56 132/67  Pulse: 64 (!) 59  Resp: 20 17  Temp: 36.5 C (!) 36.1 C  SpO2: 99% 100%    Last Pain:  Vitals:   12/16/20 1339  TempSrc: Temporal  PainSc:                  Dorothy Polhemus

## 2020-12-16 NOTE — Transfer of Care (Signed)
Immediate Anesthesia Transfer of Care Note  Patient: Derek Berg Connelly Jr.  Procedure(s) Performed: ESOPHAGOGASTRODUODENOSCOPY (EGD) (N/A ) COLONOSCOPY (N/A )  Patient Location: PACU  Anesthesia Type:General  Level of Consciousness: sedated  Airway & Oxygen Therapy: Patient Spontanous Breathing and Supernova  Post-op Assessment: Report given to RN and Post -op Vital signs reviewed and stable  Post vital signs: Reviewed and stable  Last Vitals:  Vitals Value Taken Time  BP 132/67 12/16/20 1339  Temp    Pulse 59 12/16/20 1339  Resp 17 12/16/20 1339  SpO2 100 % 12/16/20 1339    Last Pain:  Vitals:   12/16/20 1217  TempSrc: Temporal  PainSc:          Complications: No complications documented.

## 2020-12-16 NOTE — TOC Progression Note (Signed)
Transition of Care (TOC) - Progression Note    Patient Details  Name: [name]. MRN: [id number] Date of Birth: [birth year]  Transition of Care [facility]) CM/SW Romeo, RN Phone Number: 12/16/2020, 8:47 AM  Clinical Narrative:   TOC spoke with patient who was anxious about procedure.  Discussed procedure with patient, remained anxious, stated he was going to call the nurse and Dr.  Donella Stade notified nurse.  Patient lives at home by himself, states he has an Engineer, production for household duties  only (non clinical) through [employer].  He has no concerns about getting to appointments or getting medications, as his aide assists him with this.  No current concerns about returning home after discharge, but asks TOC to follow up with him.  TOC contact information given, TOC will follow for needs.    Expected Discharge Plan:  (TBD) Barriers to Discharge: Continued Medical Work up  Expected Discharge Plan and Services Expected Discharge Plan:  (TBD)   Discharge Planning Services: CM Consult   Living arrangements for the past 2 months: Single Family Home                 DME Arranged:  (TBD)         HH Arranged:  (TBD)           Social Determinants of Health (SDOH) Interventions    Readmission Risk Interventions No flowsheet data found.

## 2020-12-16 NOTE — Anesthesia Preprocedure Evaluation (Signed)
Anesthesia Evaluation  Patient identified by MRN, date of birth, ID band Patient awake    Reviewed: Allergy & Precautions, NPO status , Patient's Chart, lab work & pertinent test results  Airway Mallampati: III       Dental   Pulmonary    Pulmonary exam normal        Cardiovascular hypertension, + Peripheral Vascular Disease  Normal cardiovascular exam     Neuro/Psych Anxiety  Neuromuscular disease    GI/Hepatic (+) Cirrhosis       , Hepatitis -, C  Endo/Other  Hypothyroidism   Renal/GU   negative genitourinary   Musculoskeletal  (+) Arthritis ,   Abdominal Normal abdominal exam  (+)   Peds negative pediatric ROS (+)  Hematology  (+) anemia ,   Anesthesia Other Findings Past Medical History: 12/23/2007: AKA, RIGHT, HX OF     Comment:  Qualifier: Diagnosis of  By: Loanne Drilling MD, Sean A  03/24/2007: ANXIETY No date: Chronic pain 03/24/2007: CIRRHOSIS 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, ORGANIC 10/12/2009: FRACTURE, WRIST, LEFT     Comment:  Qualifier: Diagnosis of  By: Loanne Drilling MD, Sean A  03/24/2007: HEPATITIS C 09/12/2009: HYPERLIPIDEMIA 12/23/2007: Hypocalcemia     Comment:  Qualifier: Diagnosis of  By: Loanne Drilling MD, Sean A  12/23/2007: Pancytopenia 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     Comment:  R mid volar forearm No date: Thyroid disease 12/23/2007: UROLITHIASIS, HX OF  Reproductive/Obstetrics                             Anesthesia Physical Anesthesia Plan  ASA: III  Anesthesia Plan: General   Post-op Pain Management:    Induction: Intravenous  PONV Risk Score and Plan:   Airway Management Planned: Nasal Cannula  Additional Equipment:   Intra-op Plan:   Post-operative Plan:   Informed Consent: I have reviewed the patients History and Physical, chart, labs and discussed the procedure including the risks, benefits and alternatives for  the proposed anesthesia with the patient or authorized representative who has indicated his/her understanding and acceptance.     Dental advisory given  Plan Discussed with: CRNA and Surgeon  Anesthesia Plan Comments:         Anesthesia Quick Evaluation

## 2020-12-16 NOTE — Consult Note (Signed)
[place] Nurse Consult Note: Reason for Consult: Left LE with erythema, edema, mild weeping of serous fluid from partial thickness areas of skin loss. See photos provided from ED Provider and uploaded to EMR. The presence of a thrombus has been ruled out. Patient is s/p amputation on the contralateral limb. Wound type: infectious Pressure Injury POA: N/A Measurement: N/A Wound UMP:NTIR, moist Drainage (amount, consistency, odor) serous to light yellow Periwound: erythematous, edematous. Dressing procedure/placement/frequency: I will implement a conservative POC to augment the systemic antibiotic therapy using an antimicrobial nonadherent (xeroform) over any areas of weeping and securing with Kerlix roll gauze from just below toes to just below knee.This will be topped with an ACE bandage applied in a similar manner, from toes to knee. The foot will be placed into a pressure redistribution heel boot to prevent pressure injury. Dressing changes will be daily for visualization of the affected areas.  [place] nursing team will not follow, but will remain available to this patient, the nursing and medical teams.  Please re-consult if needed. Thanks, Maudie Flakes, MSN, RN, [place], Arther Abbott  Pager# [phone]

## 2020-12-16 NOTE — Op Note (Signed)
[facility] Gastroenterology Patient Name: [name] Procedure Date: 12/16/2020 12:49 PM MRN: [id number] Account #: [id number] Date of Birth: 1951/04/29 Admit Type: Inpatient Age: 70 Room: [facility] ENDO ROOM 2 Gender: Male Note Status: Finalized Procedure:             Colonoscopy Indications:           Positive fecal immunochemical test, Acute on chronic                         anemia. FOBT positive. Providers:             Lennette Bihari. Bonna Gains MD, MD Medicines:             Monitored Anesthesia Care Complications:         No immediate complications. Procedure:             Pre-Anesthesia Assessment:                        - Prior to the procedure, a History and Physical was                         performed, and patient medications, allergies and                         sensitivities were reviewed. The patient's tolerance                         of previous anesthesia was reviewed.                        - The risks and benefits of the procedure and the                         sedation options and risks were discussed with the                         patient. All questions were answered and informed                         consent was obtained.                        - Patient identification and proposed procedure were                         verified prior to the procedure by the physician, the                         nurse, the anesthesiologist, the anesthetist and the                         technician. The procedure was verified in the                         pre-procedure area in the procedure room in the                         endoscopy suite.                        -  Prophylactic Antibiotics: The patient does not                         require prophylactic antibiotics.                        - ASA Grade Assessment: III - A patient with severe                         systemic disease.                        - After reviewing the risks and benefits, the  patient                         was deemed in satisfactory condition to undergo the                         procedure.                        - Monitored anesthesia care was determined to be                         medically necessary for this procedure based on review                         of the patient's medical history, medications, and                         prior anesthesia history.                        - The anesthesia plan was to use monitored anesthesia                         care (MAC).                        After obtaining informed consent, the colonoscope was                         passed under direct vision. Throughout the procedure,                         the patient's blood pressure, pulse, and oxygen                         saturations were monitored continuously. The                         Colonoscope was introduced through the anus and                         advanced to the the cecum, identified by appendiceal                         orifice and ileocecal valve. The colonoscopy was  performed with ease. The patient tolerated the                         procedure well. The quality of the bowel preparation                         was fair. Findings:      The perianal and digital rectal examinations were normal.      Three sessile, non-bleeding polyps were found in the descending colon,       transverse colon and ascending colon. The polyps were 5 to 10 mm in       size. These were not removed due to platelet count less than 50,000 and       to prevent bleeding complications from polypectomy in this setting.      There is no endoscopic evidence of bleeding or mass in the entire colon.      Multiple diverticula were found in the sigmoid colon.      The exam was otherwise normal throughout the examined colon.      Non-bleeding internal hemorrhoids were found during retroflexion. The       hemorrhoids were large. Impression:            -  Preparation of the colon was fair.                        - Three 5 to 10 mm, non-bleeding polyps in the                         descending colon, in the transverse colon and in the                         ascending colon.                        - Diverticulosis in the sigmoid colon.                        - Non-bleeding internal hemorrhoids.                        - No specimens collected. Recommendation:        - Return patient to hospital ward for ongoing care.                        - Consider hematology referral and anemia workup for                         this patient with chronic anemia, and already sees                         hematology as an outpatient                        - Return to GI clinic in 4 weeks.                        - If further anemia workup shows iron deficiency  capsule study can be considered as an outpatient                        - - Repeat colonoscopy within 3 months for removal of                         polyps seen (after appropriate steps to improve                         platelet count pre-procedure).                        - Low sodium diet.                        - The findings and recommendations were discussed with                         the patient. Procedure Code(s):     --- Professional ---                        [phone], Colonoscopy, flexible; diagnostic, including                         collection of specimen(s) by brushing or washing, when                         performed (separate procedure) Diagnosis Code(s):     --- Professional ---                        K63.5, Polyp of colon                        R19.5, Other fecal abnormalities CPT copyright 2019 American Medical Association. All rights reserved. The codes documented in this report are preliminary and upon coder review may  be revised to meet current compliance requirements.  [name], MD Margretta Sidle B. Bonna Gains MD, MD 12/16/2020 1:51:44 PM This  report has been signed electronically. Number of Addenda: 0 Note Initiated On: 12/16/2020 12:49 PM Scope Withdrawal Time: 0 hours 11 minutes 4 seconds  Total Procedure Duration: 0 hours 26 minutes 16 seconds  Estimated Blood Loss:  Estimated blood loss: none.      [facility]

## 2020-12-16 NOTE — Progress Notes (Signed)
Please see procedure report from today for findings and recommendations.  Patient will need outpatient GI and hematology follow-up at the time of discharge.  GI service will sign off at this time.  Please page GI on-call with any concerns or questions, or changes in clinical status

## 2020-12-16 NOTE — Progress Notes (Signed)
PROGRESS NOTE    Derek Anon Printup Jr.  HYI:[id number] DOB: 13-Jul-1951 DOA: 12/14/2020 PCP: [name], FNP   Follow-up on cellulitis. Brief Narrative:  Derek Hallis an 70 y.o.malewith multiple medical comorbidities that include chronic hepatitis C, chronic cirrhosis of the liver, status post right AKA, chronic pain syndrome secondary to trauma in the past.  He reported progressively worsening pain in the left lower extremity.  Patient was started on Rocephin.  Patient also had acute on chronic anemia with chronic thrombocytopenia, he received 2 units of PRBC.   Assessment & Plan:   Active Problems:   Pancytopenia (HCC)   Portal venous hypertension (HCC)   Hepatic cirrhosis due to chronic hepatitis C infection ([place])   Cellulitis of left lower extremity   Hypomagnesemia  #1.  Left lower extremity cellulitis. Upon examination today, patient still has significant swelling and tenderness in the left lower extremity.  Duplex ultrasound did not show DVT.  Currently treated with Rocephin and a doxycycline.  At this point I will change to Unasyn and doxycycline. Obtain wound care.  Due to significant edema and associated with liver cirrhosis, I will also start oral Lasix.  #2.  Pancytopenia. Acute on chronic symptomatic anemia Liver cirrhosis with portal hypertension. Patient is status post a transfusion of PRBC, he also received a 1 unit platelets.  Platelet count 41 today from 27 yesterday.  Discussed with GI, patient is scheduled for work-up with colonoscopy/EGD today.  Give another unit of platelet transfusion, recheck platelet count afterwards to make sure platelet count go up above 50 before procedure. Continue IV Protonix.  #3.  Hypomagnesemia. Mild hyponatremia. Repleted.  4.  Hypothyroidism. Synthroid.    DVT prophylaxis: SCDs Code Status: Full Family Communication:  Disposition Plan:  .   Status is: Inpatient  Remains inpatient appropriate  because:Inpatient level of care appropriate due to severity of illness   Dispo: The patient is from: Home              Anticipated d/c is to: Home              Patient currently is not medically stable to d/c.   Difficult to place patient No        I/O last 3 completed shifts: In: 4302.3 [P.O.:1200; I.V.:420; Blood:2582.3; IV Piggyback:100] Out: 2700 [Urine:2700] Total I/O In: 291 [Blood:291] Out: -      Consultants:   GI  Procedures: None  Antimicrobials:  Unasyn and doxycycline.  Subjective: Patient still has significant swelling in the left leg with the redness.  No significant pain. He does not have any rectal bleeding or black stool.  He had multiple yellow stools after bowel prep last night. Denies any fever or chills. No short of breath or cough. No abdominal pain or nausea vomiting.   Objective: Vitals:   12/16/20 0826 12/16/20 0853 12/16/20 0857 12/16/20 0915  BP: (!) 137/59 (!) 155/73  (!) 122/54  Pulse: 63 67  66  Resp: 16 (P) 16 16   Temp: 97.9 F (36.6 C) 97.7 F (36.5 C)  97.6 F (36.4 C)  TempSrc:  Oral Axillary Oral  SpO2: 96% 99%  99%  Weight:      Height:        Intake/Output Summary (Last 24 hours) at 12/16/2020 1049 Last data filed at 12/16/2020 0857 Gross per 24 hour  Intake 2994 ml  Output 1500 ml  Net 1494 ml   Filed Weights   12/14/20 1211  Weight: 100  kg    Examination:  General exam: Appears calm and comfortable  Respiratory system: Clear to auscultation. Respiratory effort normal. Cardiovascular system: S1 & S2 heard, RRR. No JVD, murmurs, rubs, gallops or clicks. No pedal edema. Gastrointestinal system: Abdomen is nondistended, soft and nontender. No organomegaly or masses felt. Normal bowel sounds heard. Central nervous system: Alert and oriented. No focal neurological deficits. Extremities: Right AKA, left leg still red with significant swelling Skin: No rashes, lesions or ulcers Psychiatry:  Mood & affect  appropriate.     Data Reviewed: I have personally reviewed following labs and imaging studies  CBC: Recent Labs  Lab 12/14/20 1159 12/14/20 1800 12/15/20 0516 12/15/20 0859 12/15/20 1855 12/16/20 0426  WBC 3.5*  --  2.2*  --  3.4* 2.7*  NEUTROABS  --   --   --   --  2.0 1.7  HGB 6.1* 6.7* 6.9* 6.7* 8.5* 8.0*  HCT 19.2* 20.4* 20.9* 20.4* 26.0* 24.1*  MCV 100.0  --  95.0  --  94.2 93.4  PLT 42*  --  27*  --  40* 41*   Basic Metabolic Panel: Recent Labs  Lab 12/14/20 1159 12/15/20 0516 12/16/20 0426  NA 137 138 134*  K 4.4 4.0 3.8  CL 100 104 97*  CO2 26 26 27   GLUCOSE 114* 111* 95  BUN 18 19 14   CREATININE 0.70 0.72 0.76  CALCIUM 9.5 8.8* 9.0  MG  --   --  1.5*  PHOS  --   --  3.3   GFR: Estimated Creatinine Clearance: 103.7 mL/min (by C-G formula based on SCr of 0.76 mg/dL). Liver Function Tests: No results for input(s): AST, ALT, ALKPHOS, BILITOT, PROT, ALBUMIN in the last 168 hours. No results for input(s): LIPASE, AMYLASE in the last 168 hours. No results for input(s): AMMONIA in the last 168 hours. Coagulation Profile: Recent Labs  Lab 12/15/20 0859  INR 1.2   Cardiac Enzymes: No results for input(s): CKTOTAL, CKMB, CKMBINDEX, TROPONINI in the last 168 hours. BNP (last 3 results) No results for input(s): PROBNP in the last 8760 hours. HbA1C: No results for input(s): HGBA1C in the last 72 hours. CBG: No results for input(s): GLUCAP in the last 168 hours. Lipid Profile: No results for input(s): CHOL, HDL, LDLCALC, TRIG, CHOLHDL, LDLDIRECT in the last 72 hours. Thyroid Function Tests: Recent Labs    12/14/20 1003  TSH 8.00*   Anemia Panel: No results for input(s): VITAMINB12, FOLATE, FERRITIN, TIBC, IRON, RETICCTPCT in the last 72 hours. Sepsis Labs: Recent Labs  Lab 12/14/20 1200 12/14/20 1349  LATICACIDVEN 1.6 1.5    Recent Results (from the past 240 hour(s))  Blood culture (routine x 2)     Status: None (Preliminary result)    Collection Time: 12/14/20 12:15 PM   Specimen: BLOOD  Result Value Ref Range Status   Specimen Description BLOOD RIGHT AC  Final   Special Requests   Final    BOTTLES DRAWN AEROBIC AND ANAEROBIC Blood Culture results may not be optimal due to an inadequate volume of blood received in culture bottles   Culture   Final    NO GROWTH 2 DAYS Performed at [facility], [address] [place], [place] [address]    Report Status PENDING  Incomplete  Blood culture (routine x 2)     Status: None (Preliminary result)   Collection Time: 12/14/20  1:07 PM   Specimen: BLOOD  Result Value Ref Range Status   Specimen Description BLOOD LEFT  [facility]  Final   Special Requests   Final    BOTTLES DRAWN AEROBIC AND ANAEROBIC Blood Culture results may not be optimal due to an inadequate volume of blood received in culture bottles   Culture   Final    NO GROWTH 2 DAYS Performed at [facility], [address] [place], [place] [address]    Report Status PENDING  Incomplete  Resp Panel by RT-PCR (Flu A&B, Covid) Nasopharyngeal Swab     Status: None   Collection Time: 12/14/20  2:36 PM   Specimen: Nasopharyngeal Swab; Nasopharyngeal(NP) swabs in vial transport medium  Result Value Ref Range Status   SARS Coronavirus 2 by RT PCR NEGATIVE NEGATIVE Final    Comment: (NOTE) SARS-CoV-2 target nucleic acids are NOT DETECTED.  The SARS-CoV-2 RNA is generally detectable in upper respiratory specimens during the acute phase of infection. The lowest concentration of SARS-CoV-2 viral copies this assay can detect is 138 copies/mL. A negative result does not preclude SARS-Cov-2 infection and should not be used as the sole basis for treatment or other patient management decisions. A negative result may occur with  improper specimen collection/handling, submission of specimen other than nasopharyngeal swab, presence of viral mutation(s) within the areas targeted by this assay, and inadequate number of  viral copies(<138 copies/mL). A negative result must be combined with clinical observations, patient history, and epidemiological information. The expected result is Negative.  Fact Sheet for Patients:  EntrepreneurPulse.com.au  Fact Sheet for Healthcare Providers:  IncredibleEmployment.be  This test is no t yet approved or cleared by the [place] FDA and  has been authorized for detection and/or diagnosis of SARS-CoV-2 by FDA under an Emergency Use Authorization (EUA). This EUA will remain  in effect (meaning this test can be used) for the duration of the COVID-19 declaration under Section 564(b)(1) of the Act, 21 U.S.C.section 360bbb-3(b)(1), unless the authorization is terminated  or revoked sooner.       Influenza A by PCR NEGATIVE NEGATIVE Final   Influenza B by PCR NEGATIVE NEGATIVE Final    Comment: (NOTE) The Xpert Xpress SARS-CoV-2/FLU/RSV plus assay is intended as an aid in the diagnosis of influenza from Nasopharyngeal swab specimens and should not be used as a sole basis for treatment. Nasal washings and aspirates are unacceptable for Xpert Xpress SARS-CoV-2/FLU/RSV testing.  Fact Sheet for Patients: EntrepreneurPulse.com.au  Fact Sheet for Healthcare Providers: IncredibleEmployment.be  This test is not yet approved or cleared by the [place] FDA and has been authorized for detection and/or diagnosis of SARS-CoV-2 by FDA under an Emergency Use Authorization (EUA). This EUA will remain in effect (meaning this test can be used) for the duration of the COVID-19 declaration under Section 564(b)(1) of the Act, 21 U.S.C. section 360bbb-3(b)(1), unless the authorization is terminated or revoked.  Performed at [facility], [address], [place], [place] [address]          Radiology Studies: US Venous Img Lower Unilateral Left  Result Date: 12/14/2020 CLINICAL DATA:   Swelling redness of the left leg. EXAM: LEFT LOWER EXTREMITY VENOUS DOPPLER ULTRASOUND TECHNIQUE: Gray-scale sonography with compression, as well as color and duplex ultrasound, were performed to evaluate the deep venous system(s) from the level of the common femoral vein through the popliteal and proximal calf veins. COMPARISON:  None. FINDINGS: VENOUS Normal compressibility of the common femoral, superficial femoral, and popliteal veins, as well as the visualized calf veins. Visualized portions of profunda femoral vein and great saphenous vein unremarkable.  No filling defects to suggest DVT on grayscale or color Doppler imaging. Doppler waveforms show normal direction of venous flow, normal respiratory plasticity and response to augmentation. Limited views of the contralateral common femoral vein are unremarkable. IMPRESSION: No evidence of DVT. Electronically Signed   By: Margaretha Sheffield MD   On: 12/14/2020 14:03        Scheduled Meds: . sodium chloride   Intravenous Once  . sodium chloride   Intravenous Once  . ALPRAZolam  1 mg Oral TID  . doxycycline  100 mg Oral Q12H  . furosemide  40 mg Oral Daily  . levothyroxine  50 mcg Oral Q0600  . losartan  25 mg Oral Daily  . morphine  15 mg Oral Q12H  . multivitamin with minerals  1 tablet Oral Daily  . pantoprazole (PROTONIX) IV  40 mg Intravenous Q12H  . propranolol  20 mg Oral BID  . senna  1 tablet Oral BID   Continuous Infusions: . sodium chloride 20 mL/hr at 12/16/20 0127  . ampicillin-sulbactam (UNASYN) IV    . magnesium sulfate bolus IVPB       LOS: 2 days    Time spent: 32 minutes    Sharen Hones, MD [facility]   To contact the attending provider between 7A-7P or the covering provider during after hours 7P-7A, please log into the web site [email or web address] and access using universal [place] password for that web site. If you do not have the password, please call the hospital operator.  12/16/2020, 10:49 AM

## 2020-12-16 NOTE — Op Note (Signed)
[facility] Patient Name: [name] Procedure Date: 12/16/2020 12:50 PM MRN: [id number] Account #: [id number] Date of Birth: 14-Jun-1951 Admit Type: Inpatient Age: 70 Room: [facility] ENDO ROOM 2 Gender: Male Note Status: Finalized Procedure:             Upper GI endoscopy Indications:           Anemia Providers:             Yeng Perz B. Bonna Gains MD, MD Medicines:             Monitored Anesthesia Care Complications:         No immediate complications. Procedure:             Pre-Anesthesia Assessment:                        - The risks and benefits of the procedure and the                         sedation options and risks were discussed with the                         patient. All questions were answered and informed                         consent was obtained.                        - Patient identification and proposed procedure were                         verified prior to the procedure.                        - ASA Grade Assessment: III - A patient with severe                         systemic disease.                        After obtaining informed consent, the endoscope was                         passed under direct vision. Throughout the procedure,                         the patient's blood pressure, pulse, and oxygen                         saturations were monitored continuously. The Endoscope                         was introduced through the mouth, and advanced to the                         second part of duodenum. The upper GI endoscopy was                         accomplished with ease. The patient tolerated the  procedure well. Findings:      Mucosal changes characterized by discoloration were found in the distal       esophagus. Biopsies were taken with a cold forceps for histology. The       very distal esophagus close the GE junction was normal. The white       plaques seen here and biopsied were about 2  cm proximal to the GE       junction.      There is no endoscopic evidence of varices in the entire esophagus.      The exam of the esophagus was otherwise normal.      A single 10 mm mucosal papule (nodule) with no bleeding and no stigmata       of recent bleeding was found in the gastric antrum. Biopsies were taken       with a cold forceps for histology.      Varices with no bleeding were found in the gastric fundus. There were no       stigmata of recent bleeding.      The exam of the stomach was otherwise normal.      Patchy mildly erythematous mucosa without active bleeding and with no       stigmata of bleeding was found in the duodenal bulb. Biopsies were taken       with a cold forceps for histology.      The exam of the duodenum was otherwise normal. Impression:            - Discolored mucosa in the esophagus. Biopsied.                        - A single mucosal papule (nodule) found in the                         stomach. Biopsied.                        - Gastric varices, without bleeding.                        - Erythematous duodenopathy. Biopsied. Recommendation:        - Await pathology results.                        - Return patient to hospital ward for ongoing care.                        - Continue present medications.                        - The findings and recommendations were discussed with                         the patient. Procedure Code(s):     --- Professional ---                        [phone], Esophagogastroduodenoscopy, flexible,                         transoral; with biopsy, single or multiple Diagnosis Code(s):     --- Professional ---  K22.8, Other specified diseases of esophagus                        K31.89, Other diseases of stomach and duodenum                        I86.4, Gastric varices CPT copyright 2019 American Medical Association. All rights reserved. The codes documented in this report are preliminary and upon coder  review may  be revised to meet current compliance requirements.  [name], MD Margretta Sidle B. Bonna Gains MD, MD 12/16/2020 1:04:02 PM This report has been signed electronically. Number of Addenda: 0 Note Initiated On: 12/16/2020 12:50 PM Estimated Blood Loss:  Estimated blood loss: none.      [facility]

## 2020-12-16 NOTE — Progress Notes (Signed)
[name], MD [address], [place], [place], [place], [address] [address], [place], [place], [place], [address] Phone: [phone]  Fax: [phone]   Subjective:  Patient denies any abdominal pain.  No episodes of bleeding.  No nausea or vomiting.  Completed most to all of the prep  Objective: Exam: Vital signs in last 24 hours: Vitals:   12/16/20 0853 12/16/20 0857 12/16/20 0915 12/16/20 1130  BP: (!) 155/73  (!) 122/54 132/61  Pulse: 67  66 65  Resp: (P) 16 16  18   Temp: 97.7 F (36.5 C)  97.6 F (36.4 C) 97.6 F (36.4 C)  TempSrc: Oral Axillary Oral Oral  SpO2: 99%  99% 99%  Weight:      Height:       Weight change:   Intake/Output Summary (Last 24 hours) at 12/16/2020 1140 Last data filed at 12/16/2020 1129 Gross per 24 hour  Intake 3298 ml  Output 1500 ml  Net 1798 ml    General: No acute distress, AAO x3 Abd: Soft, NT/ND, No HSM Skin: Warm, no rashes Neck: Supple, Trachea midline   Lab Results: Lab Results  Component Value Date   WBC 2.7 (L) 12/16/2020   HGB 8.0 (L) 12/16/2020   HCT 24.1 (L) 12/16/2020   MCV 93.4 12/16/2020   PLT 45 (L) 12/16/2020   Micro Results: Recent Results (from the past 240 hour(s))  Blood culture (routine x 2)     Status: None (Preliminary result)   Collection Time: 12/14/20 12:15 PM   Specimen: BLOOD  Result Value Ref Range Status   Specimen Description BLOOD RIGHT AC  Final   Special Requests   Final    BOTTLES DRAWN AEROBIC AND ANAEROBIC Blood Culture results may not be optimal due to an inadequate volume of blood received in culture bottles   Culture   Final    NO GROWTH 2 DAYS Performed at [facility], [address] [place], [place] [address]    Report Status PENDING  Incomplete  Blood culture (routine x 2)     Status: None (Preliminary result)   Collection Time: 12/14/20  1:07 PM   Specimen: BLOOD  Result Value Ref Range Status   Specimen Description BLOOD LEFT AC  Final    Special Requests   Final    BOTTLES DRAWN AEROBIC AND ANAEROBIC Blood Culture results may not be optimal due to an inadequate volume of blood received in culture bottles   Culture   Final    NO GROWTH 2 DAYS Performed at [facility], [address] [place], [place] [address]    Report Status PENDING  Incomplete  Resp Panel by RT-PCR (Flu A&B, Covid) Nasopharyngeal Swab     Status: None   Collection Time: 12/14/20  2:36 PM   Specimen: Nasopharyngeal Swab; Nasopharyngeal(NP) swabs in vial transport medium  Result Value Ref Range Status   SARS Coronavirus 2 by RT PCR NEGATIVE NEGATIVE Final    Comment: (NOTE) SARS-CoV-2 target nucleic acids are NOT DETECTED.  The SARS-CoV-2 RNA is generally detectable in upper respiratory specimens during the acute phase of infection. The lowest concentration of SARS-CoV-2 viral copies this assay can detect is 138 copies/mL. A negative result does not preclude SARS-Cov-2 infection and should not be used as the sole basis for treatment or other patient management decisions. A negative result may occur with  improper specimen collection/handling, submission of specimen other than nasopharyngeal swab, presence of viral mutation(s) within the areas targeted by  this assay, and inadequate number of viral copies(<138 copies/mL). A negative result must be combined with clinical observations, patient history, and epidemiological information. The expected result is Negative.  Fact Sheet for Patients:  EntrepreneurPulse.com.au  Fact Sheet for Healthcare Providers:  IncredibleEmployment.be  This test is no t yet approved or cleared by the [place] FDA and  has been authorized for detection and/or diagnosis of SARS-CoV-2 by FDA under an Emergency Use Authorization (EUA). This EUA will remain  in effect (meaning this test can be used) for the duration of the COVID-19 declaration under Section 564(b)(1) of the  Act, 21 U.S.C.section 360bbb-3(b)(1), unless the authorization is terminated  or revoked sooner.       Influenza A by PCR NEGATIVE NEGATIVE Final   Influenza B by PCR NEGATIVE NEGATIVE Final    Comment: (NOTE) The Xpert Xpress SARS-CoV-2/FLU/RSV plus assay is intended as an aid in the diagnosis of influenza from Nasopharyngeal swab specimens and should not be used as a sole basis for treatment. Nasal washings and aspirates are unacceptable for Xpert Xpress SARS-CoV-2/FLU/RSV testing.  Fact Sheet for Patients: EntrepreneurPulse.com.au  Fact Sheet for Healthcare Providers: IncredibleEmployment.be  This test is not yet approved or cleared by the [place] FDA and has been authorized for detection and/or diagnosis of SARS-CoV-2 by FDA under an Emergency Use Authorization (EUA). This EUA will remain in effect (meaning this test can be used) for the duration of the COVID-19 declaration under Section 564(b)(1) of the Act, 21 U.S.C. section 360bbb-3(b)(1), unless the authorization is terminated or revoked.  Performed at [facility], [address] [place], [place] [address]    Studies/Results: US Venous Img Lower Unilateral Left  Result Date: 12/14/2020 CLINICAL DATA:  Swelling redness of the left leg. EXAM: LEFT LOWER EXTREMITY VENOUS DOPPLER ULTRASOUND TECHNIQUE: Gray-scale sonography with compression, as well as color and duplex ultrasound, were performed to evaluate the deep venous system(s) from the level of the common femoral vein through the popliteal and proximal calf veins. COMPARISON:  None. FINDINGS: VENOUS Normal compressibility of the common femoral, superficial femoral, and popliteal veins, as well as the visualized calf veins. Visualized portions of profunda femoral vein and great saphenous vein unremarkable. No filling defects to suggest DVT on grayscale or color Doppler imaging. Doppler waveforms show normal direction of  venous flow, normal respiratory plasticity and response to augmentation. Limited views of the contralateral common femoral vein are unremarkable. IMPRESSION: No evidence of DVT. Electronically Signed   By: Margaretha Sheffield MD   On: 12/14/2020 14:03   Medications:  Scheduled Meds: . sodium chloride   Intravenous Once  . sodium chloride   Intravenous Once  . ALPRAZolam  1 mg Oral TID  . doxycycline  100 mg Oral Q12H  . furosemide  40 mg Oral Daily  . levothyroxine  50 mcg Oral Q0600  . losartan  25 mg Oral Daily  . morphine  15 mg Oral Q12H  . multivitamin with minerals  1 tablet Oral Daily  . pantoprazole (PROTONIX) IV  40 mg Intravenous Q12H  . propranolol  20 mg Oral BID  . senna  1 tablet Oral BID   Continuous Infusions: . sodium chloride 20 mL/hr at 12/16/20 0127  . ampicillin-sulbactam (UNASYN) IV    . magnesium sulfate bolus IVPB     PRN Meds:.albuterol, hydrALAZINE, lubiprostone, naloxone, oxycodone   Assessment: Active Problems:   Pancytopenia (HCC)   Hypothyroidism   Portal venous hypertension (HCC)   Hepatic cirrhosis due to chronic  hepatitis C infection ([place])   Cellulitis of left lower extremity   Hypomagnesemia    Plan: Primary team optimizing the patient with platelet transfusion as it was 27,000 yesterday  INR is less than 1.5  Proceed with EGD and colonoscopy for evaluation of acute on chronic anemia with hemoglobin of 6.7 on presentation and is improved to 8.0 after PRBC transfusion  I have discussed alternative options, risks & benefits,  which include, but are not limited to, bleeding, infection, perforation,respiratory complication & drug reaction.  The patient agrees with this plan & written consent will be obtained.       LOS: 2 days   [name], MD 12/16/2020, 11:40 AM

## 2020-12-16 NOTE — Anesthesia Procedure Notes (Signed)
Performed by: Abou Sterkel, CRNA Pre-anesthesia Checklist: Patient identified, Emergency Drugs available, Suction available and Patient being monitored Patient Re-evaluated:Patient Re-evaluated prior to induction Oxygen Delivery Method: Supernova nasal CPAP Induction Type: IV induction Dental Injury: Teeth and Oropharynx as per pre-operative assessment  Comments: Nasal cannula with etCO2 monitoring       

## 2020-12-16 NOTE — Plan of Care (Signed)
  Problem: Activity: Goal: Risk for activity intolerance will decrease Outcome: Progressing   Problem: Nutrition: Goal: Adequate nutrition will be maintained Outcome: Progressing   Problem: Elimination: Goal: Will not experience complications related to bowel motility Outcome: Progressing Goal: Will not experience complications related to urinary retention Outcome: Progressing   

## 2020-12-17 DIAGNOSIS — D61818 Other pancytopenia: Secondary | ICD-10-CM | POA: Diagnosis not present

## 2020-12-17 DIAGNOSIS — L03116 Cellulitis of left lower limb: Secondary | ICD-10-CM | POA: Diagnosis not present

## 2020-12-17 DIAGNOSIS — B182 Chronic viral hepatitis C: Secondary | ICD-10-CM | POA: Diagnosis not present

## 2020-12-17 DIAGNOSIS — K7460 Unspecified cirrhosis of liver: Secondary | ICD-10-CM | POA: Diagnosis not present

## 2020-12-17 LAB — BASIC METABOLIC PANEL
Anion gap: 9 (ref 5–15)
BUN: 18 mg/dL (ref 8–23)
CO2: 25 mmol/L (ref 22–32)
Calcium: 9 mg/dL (ref 8.9–10.3)
Chloride: 101 mmol/L (ref 98–111)
Creatinine, Ser: 0.8 mg/dL (ref 0.61–1.24)
GFR, Estimated: >60 mL/min (ref >60)
Glucose, Bld: 116 mg/dL — ABNORMAL HIGH (ref 70–99)
Potassium: 4 mmol/L (ref 3.5–5.1)
Sodium: 135 mmol/L (ref 135–145)

## 2020-12-17 LAB — CBC WITH DIFFERENTIAL/PLATELET
Abs Immature Granulocytes: 0.02 10*3/uL (ref 0.00–0.07)
Basophils Absolute: 0 10*3/uL (ref 0.0–0.1)
Basophils Relative: 1 %
Eosinophils Absolute: 0.1 10*3/uL (ref 0.0–0.5)
Eosinophils Relative: 4 %
HCT: 23.7 % — ABNORMAL LOW (ref 39.0–52.0)
Hemoglobin: 7.9 g/dL — ABNORMAL LOW (ref 13.0–17.0)
Immature Granulocytes: 1 %
Lymphocytes Relative: 26 %
Lymphs Abs: 0.5 10*3/uL — ABNORMAL LOW (ref 0.7–4.0)
MCH: 30.9 pg (ref 26.0–34.0)
MCHC: 33.3 g/dL (ref 30.0–36.0)
MCV: 92.6 fL (ref 80.0–100.0)
Monocytes Absolute: 0.1 10*3/uL (ref 0.1–1.0)
Monocytes Relative: 6 %
Neutro Abs: 1.3 10*3/uL — ABNORMAL LOW (ref 1.7–7.7)
Neutrophils Relative %: 62 %
Platelets: 38 10*3/uL — ABNORMAL LOW (ref 150–400)
RBC: 2.56 MIL/uL — ABNORMAL LOW (ref 4.22–5.81)
RDW: 20.7 % — ABNORMAL HIGH (ref 11.5–15.5)
WBC: 2.1 10*3/uL — ABNORMAL LOW (ref 4.0–10.5)
nRBC: 0 % (ref 0.0–0.2)

## 2020-12-17 LAB — MAGNESIUM: Magnesium: 1.9 mg/dL (ref 1.7–2.4)

## 2020-12-17 NOTE — Plan of Care (Signed)
  Problem: Pain Managment: Goal: General experience of comfort will improve Outcome: Progressing   Problem: Safety: Goal: Ability to remain free from injury will improve Outcome: Progressing   Problem: Skin Integrity: Goal: Risk for impaired skin integrity will decrease Outcome: Progressing   

## 2020-12-17 NOTE — Progress Notes (Signed)
PROGRESS NOTE    [name] Jr.  PYP:[id number] DOB: 11-30-1950 DOA: 12/14/2020 PCP: [name], FNP   Follow-up on cellulitis. Brief Narrative:  Derek Hallis an 70 y.o.malewith multiple medical comorbidities that include chronic hepatitis C, chronic cirrhosis of the liver, status post right AKA, chronic pain syndrome secondary to trauma in the past.He reported progressively worsening pain in the left lower extremity. Patient was started on Rocephin.Patient also had acute on chronic anemia with chronic thrombocytopenia, he received 2 units of PRBC. 5/20.  EGD showed gastric varices, no bleeding.  Also biopsied several discolored area.  No active bleeding.  Colonoscopy showed multiple polyps, could not remove due to thrombocytopenia.   Assessment & Plan:   Active Problems:   Pancytopenia ([place])   Hypothyroidism   Portal venous hypertension (HCC)   Hepatic cirrhosis due to chronic hepatitis C infection ([place])   Cellulitis of left lower extremity   Hypomagnesemia   Symptomatic anemia   Gastric nodule   Duodenal erythema   Polyp of colon  #1.  Left lower extremity cellulitis. Antibiotic changed to Unasyn and doxycycline yesterday, Ace wrap applied.  Condition much improved today, I will continue 1 more day antibiotics, I will switch to Augmentin and doxycycline tomorrow and discharge. I also added 40 mg of Lasix orally to reduce edema.  #2.  Pancytopenia. Acute on chronic symptomatic anemia Liver cirrhosis with portal hypertension. EGD and colonoscopy did not show active bleeding.  Pancytopenia likely due to liver cirrhosis.  #3.  Hypomagnesemia Hyponatremia. Continue to follow.  4.  Hypothyroidism. Continue Synthroid.    DVT prophylaxis: SCD Code Status: Full Family Communication:  Disposition Plan:  .   Status is: Inpatient  Remains inpatient appropriate because:Inpatient level of care appropriate due to severity of illness   Dispo:  The patient is from: Home              Anticipated d/c is to: Home              Patient currently is not medically stable to d/c.   Difficult to place patient No        I/O last 3 completed shifts: In: 2095 [P.O.:240; I.V.:1010; Blood:595; IV Piggyback:250] Out: 2000 [Urine:2000] No intake/output data recorded.     Consultants:   GI  Procedures:   Antimicrobials:  Unasyn and doxycycline.  Subjective: Patient doing well today, does not have any short of breath, no cough. Denies any abdominal pain or nausea vomiting.  No black stool or rectal bleeding. No dysuria hematuria pain No fever chills No chest pain or palpitation.  Objective: Vitals:   12/16/20 2008 12/16/20 2200 12/17/20 0516 12/17/20 0852  BP: (!) 109/39 (!) 121/44 (!) 114/49 134/72  Pulse: (!) 59 68 66 68  Resp: 17 17 18 18   Temp: 98 F (36.7 C)  97.7 F (36.5 C) 97.6 F (36.4 C)  TempSrc:      SpO2: 97%  97% 99%  Weight:      Height:        Intake/Output Summary (Last 24 hours) at 12/17/2020 1014 Last data filed at 12/17/2020 0530 Gross per 24 hour  Intake 1284 ml  Output 1350 ml  Net -66 ml   Filed Weights   12/14/20 1211  Weight: 100 kg    Examination:  General exam: Appears calm and comfortable  Respiratory system: Clear to auscultation. Respiratory effort normal. Cardiovascular system: S1 & S2 heard, RRR. No JVD, murmurs, rubs, gallops or clicks. No  pedal edema. Gastrointestinal system: Abdomen is nondistended, soft and nontender. No organomegaly or masses felt. Normal bowel sounds heard. Central nervous system: Alert and oriented. No focal neurological deficits. Extremities: Right AKA, left leg swelling and redness much improved. Skin: No rashes, lesions or ulcers Psychiatry: Judgement and insight appear normal. Mood & affect appropriate.     Data Reviewed: I have personally reviewed following labs and imaging studies  CBC: Recent Labs  Lab 12/14/20 1159 12/14/20 1800  12/15/20 0516 12/15/20 0859 12/15/20 1855 12/16/20 0426 12/16/20 1033 12/16/20 1228 12/17/20 0733  WBC 3.5*  --  2.2*  --  3.4* 2.7*  --   --  2.1*  NEUTROABS  --   --   --   --  2.0 1.7  --   --  1.3*  HGB 6.1*   < > 6.9* 6.7* 8.5* 8.0*  --   --  7.9*  HCT 19.2*   < > 20.9* 20.4* 26.0* 24.1*  --   --  23.7*  MCV 100.0  --  95.0  --  94.2 93.4  --   --  92.6  PLT 42*  --  27*  --  40* 41* 45* 39* 38*   < > = values in this interval not displayed.   Basic Metabolic Panel: Recent Labs  Lab 12/14/20 1159 12/15/20 0516 12/16/20 0426 12/17/20 0733  NA 137 138 134* 135  K 4.4 4.0 3.8 4.0  CL 100 104 97* 101  CO2 26 26 27 25   GLUCOSE 114* 111* 95 116*  BUN 18 19 14 18   CREATININE 0.70 0.72 0.76 0.80  CALCIUM 9.5 8.8* 9.0 9.0  MG  --   --  1.5* 1.9  PHOS  --   --  3.3  --    GFR: Estimated Creatinine Clearance: 103.7 mL/min (by C-G formula based on SCr of 0.8 mg/dL). Liver Function Tests: No results for input(s): AST, ALT, ALKPHOS, BILITOT, PROT, ALBUMIN in the last 168 hours. No results for input(s): LIPASE, AMYLASE in the last 168 hours. No results for input(s): AMMONIA in the last 168 hours. Coagulation Profile: Recent Labs  Lab 12/15/20 0859  INR 1.2   Cardiac Enzymes: No results for input(s): CKTOTAL, CKMB, CKMBINDEX, TROPONINI in the last 168 hours. BNP (last 3 results) No results for input(s): PROBNP in the last 8760 hours. HbA1C: No results for input(s): HGBA1C in the last 72 hours. CBG: No results for input(s): GLUCAP in the last 168 hours. Lipid Profile: No results for input(s): CHOL, HDL, LDLCALC, TRIG, CHOLHDL, LDLDIRECT in the last 72 hours. Thyroid Function Tests: No results for input(s): TSH, T4TOTAL, FREET4, T3FREE, THYROIDAB in the last 72 hours. Anemia Panel: No results for input(s): VITAMINB12, FOLATE, FERRITIN, TIBC, IRON, RETICCTPCT in the last 72 hours. Sepsis Labs: Recent Labs  Lab 12/14/20 1200 12/14/20 1349  LATICACIDVEN 1.6 1.5     Recent Results (from the past 240 hour(s))  Blood culture (routine x 2)     Status: None (Preliminary result)   Collection Time: 12/14/20 12:15 PM   Specimen: BLOOD  Result Value Ref Range Status   Specimen Description BLOOD RIGHT [facility]  Final   Special Requests   Final    BOTTLES DRAWN AEROBIC AND ANAEROBIC Blood Culture results may not be optimal due to an inadequate volume of blood received in culture bottles   Culture   Final    NO GROWTH 3 DAYS Performed at [facility], [address] [place], [place] [address]  Report Status PENDING  Incomplete  Blood culture (routine x 2)     Status: None (Preliminary result)   Collection Time: 12/14/20  1:07 PM   Specimen: BLOOD  Result Value Ref Range Status   Specimen Description BLOOD LEFT AC  Final   Special Requests   Final    BOTTLES DRAWN AEROBIC AND ANAEROBIC Blood Culture results may not be optimal due to an inadequate volume of blood received in culture bottles   Culture   Final    NO GROWTH 3 DAYS Performed at [facility], [address] [place], [place] [address]    Report Status PENDING  Incomplete  Resp Panel by RT-PCR (Flu A&B, Covid) Nasopharyngeal Swab     Status: None   Collection Time: 12/14/20  2:36 PM   Specimen: Nasopharyngeal Swab; Nasopharyngeal(NP) swabs in vial transport medium  Result Value Ref Range Status   SARS Coronavirus 2 by RT PCR NEGATIVE NEGATIVE Final    Comment: (NOTE) SARS-CoV-2 target nucleic acids are NOT DETECTED.  The SARS-CoV-2 RNA is generally detectable in upper respiratory specimens during the acute phase of infection. The lowest concentration of SARS-CoV-2 viral copies this assay can detect is 138 copies/mL. A negative result does not preclude SARS-Cov-2 infection and should not be used as the sole basis for treatment or other patient management decisions. A negative result may occur with  improper specimen collection/handling, submission of specimen  other than nasopharyngeal swab, presence of viral mutation(s) within the areas targeted by this assay, and inadequate number of viral copies(<138 copies/mL). A negative result must be combined with clinical observations, patient history, and epidemiological information. The expected result is Negative.  Fact Sheet for Patients:  EntrepreneurPulse.com.au  Fact Sheet for Healthcare Providers:  IncredibleEmployment.be  This test is no t yet approved or cleared by the [place] FDA and  has been authorized for detection and/or diagnosis of SARS-CoV-2 by FDA under an Emergency Use Authorization (EUA). This EUA will remain  in effect (meaning this test can be used) for the duration of the COVID-19 declaration under Section 564(b)(1) of the Act, 21 U.S.C.section 360bbb-3(b)(1), unless the authorization is terminated  or revoked sooner.       Influenza A by PCR NEGATIVE NEGATIVE Final   Influenza B by PCR NEGATIVE NEGATIVE Final    Comment: (NOTE) The Xpert Xpress SARS-CoV-2/FLU/RSV plus assay is intended as an aid in the diagnosis of influenza from Nasopharyngeal swab specimens and should not be used as a sole basis for treatment. Nasal washings and aspirates are unacceptable for Xpert Xpress SARS-CoV-2/FLU/RSV testing.  Fact Sheet for Patients: EntrepreneurPulse.com.au  Fact Sheet for Healthcare Providers: IncredibleEmployment.be  This test is not yet approved or cleared by the [place] FDA and has been authorized for detection and/or diagnosis of SARS-CoV-2 by FDA under an Emergency Use Authorization (EUA). This EUA will remain in effect (meaning this test can be used) for the duration of the COVID-19 declaration under Section 564(b)(1) of the Act, 21 U.S.C. section 360bbb-3(b)(1), unless the authorization is terminated or revoked.  Performed at [facility], [address],  [place], [place] [address]          Radiology Studies: No results found.      Scheduled Meds: . sodium chloride   Intravenous Once  . sodium chloride   Intravenous Once  . ALPRAZolam  1 mg Oral TID  . doxycycline  100 mg Oral Q12H  . furosemide  40 mg Oral Daily  . levothyroxine  50 mcg Oral Q0600  . losartan  25 mg Oral Daily  . morphine  15 mg Oral Q12H  . multivitamin with minerals  1 tablet Oral Daily  . pantoprazole (PROTONIX) IV  40 mg Intravenous Q12H  . propranolol  20 mg Oral BID  . senna  1 tablet Oral BID   Continuous Infusions: . ampicillin-sulbactam (UNASYN) IV 3 g (12/17/20 0931)     LOS: 3 days    Time spent: 28 minutes    Sharen Hones, MD [facility]   To contact the attending provider between 7A-7P or the covering provider during after hours 7P-7A, please log into the web site [email or web address] and access using universal [place] password for that web site. If you do not have the password, please call the hospital operator.  12/17/2020, 10:14 AM

## 2020-12-18 DIAGNOSIS — B182 Chronic viral hepatitis C: Secondary | ICD-10-CM | POA: Diagnosis not present

## 2020-12-18 DIAGNOSIS — D61818 Other pancytopenia: Secondary | ICD-10-CM | POA: Diagnosis not present

## 2020-12-18 DIAGNOSIS — L03116 Cellulitis of left lower limb: Secondary | ICD-10-CM | POA: Diagnosis not present

## 2020-12-18 DIAGNOSIS — K7460 Unspecified cirrhosis of liver: Secondary | ICD-10-CM | POA: Diagnosis not present

## 2020-12-18 LAB — CBC WITH DIFFERENTIAL/PLATELET
Abs Immature Granulocytes: 0.01 10*3/uL (ref 0.00–0.07)
Basophils Absolute: 0 10*3/uL (ref 0.0–0.1)
Basophils Relative: 1 %
Eosinophils Absolute: 0.1 10*3/uL (ref 0.0–0.5)
Eosinophils Relative: 5 %
HCT: 25.1 % — ABNORMAL LOW (ref 39.0–52.0)
Hemoglobin: 8.1 g/dL — ABNORMAL LOW (ref 13.0–17.0)
Immature Granulocytes: 1 %
Lymphocytes Relative: 28 %
Lymphs Abs: 0.6 10*3/uL — ABNORMAL LOW (ref 0.7–4.0)
MCH: 30.7 pg (ref 26.0–34.0)
MCHC: 32.3 g/dL (ref 30.0–36.0)
MCV: 95.1 fL (ref 80.0–100.0)
Monocytes Absolute: 0.2 10*3/uL (ref 0.1–1.0)
Monocytes Relative: 9 %
Neutro Abs: 1.3 10*3/uL — ABNORMAL LOW (ref 1.7–7.7)
Neutrophils Relative %: 56 %
Platelets: 37 10*3/uL — ABNORMAL LOW (ref 150–400)
RBC: 2.64 MIL/uL — ABNORMAL LOW (ref 4.22–5.81)
RDW: 20.8 % — ABNORMAL HIGH (ref 11.5–15.5)
WBC: 2.2 10*3/uL — ABNORMAL LOW (ref 4.0–10.5)
nRBC: 0 % (ref 0.0–0.2)

## 2020-12-18 LAB — BPAM RBC
Blood Product Expiration Date: 202205242359
Blood Product Expiration Date: 202205252359
Blood Product Expiration Date: 202206242359
Blood Product Expiration Date: 202206242359
Blood Product Expiration Date: 202206282359
ISSUE DATE / TIME: 202205182031
ISSUE DATE / TIME: 202205190012
ISSUE DATE / TIME: 202205191505
Unit Type and Rh: 1700
Unit Type and Rh: 5100
Unit Type and Rh: 5100
Unit Type and Rh: 9500
Unit Type and Rh: 9500

## 2020-12-18 LAB — TYPE AND SCREEN
ABO/RH(D): B NEG
Antibody Screen: POSITIVE
PT AG Type: POSITIVE
Unit division: 0
Unit division: 0
Unit division: 0
Unit division: 0
Unit division: 0

## 2020-12-18 LAB — BASIC METABOLIC PANEL
Anion gap: 8 (ref 5–15)
BUN: 17 mg/dL (ref 8–23)
CO2: 26 mmol/L (ref 22–32)
Calcium: 9 mg/dL (ref 8.9–10.3)
Chloride: 105 mmol/L (ref 98–111)
Creatinine, Ser: 0.73 mg/dL (ref 0.61–1.24)
GFR, Estimated: >60 mL/min (ref >60)
Glucose, Bld: 132 mg/dL — ABNORMAL HIGH (ref 70–99)
Potassium: 4 mmol/L (ref 3.5–5.1)
Sodium: 139 mmol/L (ref 135–145)

## 2020-12-18 LAB — PREPARE PLATELET PHERESIS: Unit division: 0

## 2020-12-18 LAB — BPAM PLATELET PHERESIS
Blood Product Expiration Date: 202205212359
ISSUE DATE / TIME: 202205200846
Unit Type and Rh: 7300

## 2020-12-18 LAB — MAGNESIUM: Magnesium: 1.8 mg/dL (ref 1.7–2.4)

## 2020-12-18 MED ORDER — DOXYCYCLINE HYCLATE 100 MG PO TABS: 100.0000 mg | ORAL_TABLET | Freq: Two times a day (BID) | ORAL | 0 refills | Status: AC

## 2020-12-18 MED ORDER — FUROSEMIDE 40 MG PO TABS: 40.0000 mg | ORAL_TABLET | Freq: Every day | ORAL | 0 refills | Status: DC

## 2020-12-18 MED ORDER — PANTOPRAZOLE SODIUM 40 MG PO TBEC: 40.0000 mg | DELAYED_RELEASE_TABLET | Freq: Every day | ORAL | 0 refills | Status: DC

## 2020-12-18 MED ORDER — AMOXICILLIN-POT CLAVULANATE 875-125 MG PO TABS: 1.0000 | ORAL_TABLET | Freq: Two times a day (BID) | ORAL | 0 refills | Status: AC

## 2020-12-18 NOTE — TOC Transition Note (Addendum)
Transition of Care [facility]) - CM/SW Discharge Note   Patient Details  Name: [name]. MRN: [id number] Date of Birth: [birth year]  [facility] CM/SW Contact:  Boris Sharper, LCSW Phone Number: 12/18/2020, 10:16 AM   Clinical Narrative:    Pt medically stable for discharge per MD. Pt will be transported home by his private pay caregiver Hassan Rowan. Pt will receive [place] RN through [facility] for Wound Care.    Final next level of care: Home w Home Health Services Barriers to Discharge: No Barriers Identified   Patient Goals and CMS Choice   CMS Medicare.gov Compare Post Acute Care list provided to:: Patient Choice offered to / list presented to : Patient  Discharge Placement                  Name of family member notified: R[place] Patient and family notified of of transfer: 12/18/20  Discharge Plan and Services   Discharge Planning Services: CM Consult            DME Arranged:  (TBD)         HH Arranged: RN [place] Agency: [place] Date [place]: 12/18/20 Time [place]: 1015 Representative spoke with at [place]: [place] ([place]) Interventions     Readmission Risk Interventions No flowsheet data found.

## 2020-12-18 NOTE — Discharge Summary (Addendum)
Physician Discharge Summary  Patient ID: [name]. MRN: [id number] DOB/AGE: 1951-06-26 70 y.o.  Admit date: 12/14/2020 Discharge date: 12/18/2020  Admission Diagnoses:  Discharge Diagnoses:  Active Problems:   Pancytopenia ([place])   Hypothyroidism   Portal venous hypertension (HCC)   Hepatic cirrhosis due to chronic hepatitis C infection ([place])   Cellulitis of left lower extremity   Hypomagnesemia   Symptomatic anemia   Gastric nodule   Duodenal erythema   Polyp of colon   Discharged Condition: good  Hospital Course:  Derek Hallis an 70 y.o.malewith multiple medical comorbidities that include chronic hepatitis C, chronic cirrhosis of the liver, status post right AKA, chronic pain syndrome secondary to trauma in the past.He reported progressively worsening pain in the left lower extremity. Patient was started on Rocephin.Patient also had acute on chronic anemia with chronic thrombocytopenia, he received 2 units of PRBC. 5/20.  EGD showed gastric varices, no bleeding.  Also biopsied several discolored area.  No active bleeding.  Colonoscopy showed multiple polyps, could not remove due to thrombocytopenia.  #1.  Left lower extremity cellulitis. Initially treated with Rocephin, and antibiotic changed to Unasyn and doxycycline 5/20, Ace wrap applied.    Condition much improved.  Leg edema much improved.  I also added 40 mg of Lasix orally to reduce edema, will continue 5 days of oral Lasix.  #2. Pancytopenia. Acute on chronic symptomatic anemia Liver cirrhosis with portal hypertension. Colon polyps. Discoloration of stomach.  With gastric nodule. EGD and colonoscopy did not show active bleeding.  Pancytopenia likely due to liver cirrhosis. Patient received total of 3 units of PRBC, also received 2 units of platelet transfusion prior to procedure.  Condition has been stable.  Patient does not have any active bleeding. Currently, hemoglobin and platelets are  stable.  Patient be followed with hematology as outpatient. Patient be also followed by GI as outpatient in 2 weeks.  #3.  Hypomagnesemia Hyponatremia. Improved.  4.  Hypothyroidism. Continue Synthroid.    Consults: GI  Significant Diagnostic Studies:  Colonoscopy - Preparation of the colon was fair. - Three 5 to 10 mm, non-bleeding polyps in the descending colon, in the transverse colon and in the ascending colon. - Diverticulosis in the sigmoid colon. - Non-bleeding internal hemorrhoids. - No specimens collected. Impression: - Return patient to hospital ward for ongoing care. - Consider hematology referral and anemia workup for this patient with chronic anemia, and already sees hematology as an outpatient - Return to GI clinic in 4 weeks. - If further anemia workup shows iron deficiency capsule study can be considered as an outpatient - - Repeat colonoscopy within 3 months for removal of polyps seen (after appropriate steps to improve platelet count pre-procedure). - Low sodium diet. - The findings and recommendations were discussed with the patient.  EGD Discolored mucosa in the esophagus. Biopsied. - A single mucosal papule (nodule) found in the stomach. Biopsied. - Gastric varices, without bleeding. - Erythematous duodenopathy. Biopsied. Impression: - Await pathology results. - Return patient to hospital ward for ongoing care. - Continue present medications. - The findings and recommendations were discussed with the patient.  Treatments: EGD, PRBC, Platelets transfusion  Discharge Exam: Blood pressure 135/63, pulse 71, temperature 99.1 F (37.3 C), resp. rate 16, height 6' (1.829 m), weight 100 kg, SpO2 97 %. General appearance: alert and cooperative Resp: clear to auscultation bilaterally Cardio: regular rate and rhythm, S1, S2 normal, no murmur, click, rub or gallop GI: soft, non-tender; bowel sounds normal;  no masses,  no organomegaly Extremities: Right  AKA, left leg swelling much better, tenderness resolved.  Redness much improved.  Disposition: Discharge disposition: 01-Home or Self Care       Discharge Instructions    Ambulatory referral to Wound Clinic   Complete by: As directed    Diet - low sodium heart healthy   Complete by: As directed    Discharge wound care:   Complete by: As directed    Follow with RN, refer to wc outpatient   Increase activity slowly   Complete by: As directed      Allergies as of 12/18/2020      Reactions   Clindamycin Dermatitis      Medication List    STOP taking these medications   hydrochlorothiazide 12.5 MG tablet Commonly known as: HYDRODIURIL   meloxicam 15 MG tablet Commonly known as: MOBIC     TAKE these medications   ALPRAZolam 1 MG tablet Commonly known as: XANAX Take 1 mg by mouth 3 (three) times daily.   amoxicillin-clavulanate 875-125 MG tablet Commonly known as: Augmentin Take 1 tablet by mouth 2 (two) times daily for 5 days.   doxycycline 100 MG tablet Commonly known as: VIBRA-TABS Take 1 tablet (100 mg total) by mouth every 12 (twelve) hours for 5 days.   folic acid 1 MG tablet Commonly known as: FOLVITE TAKE 1 TABLET(1 MG) BY MOUTH DAILY   furosemide 40 MG tablet Commonly known as: LASIX Take 1 tablet (40 mg total) by mouth daily for 5 days. Start taking on: Dec 19, 2020   levothyroxine 50 MCG tablet Commonly known as: SYNTHROID TAKE 1 TABLET(50 MCG) BY MOUTH DAILY BEFORE BREAKFAST   losartan 100 MG tablet Commonly known as: COZAAR TAKE 1 TABLET(100 MG) BY MOUTH DAILY   lubiprostone 24 MCG capsule Commonly known as: AMITIZA Take 24 mcg by mouth daily as needed.   morphine 15 MG 12 hr tablet Commonly known as: MS CONTIN Take 15 mg by mouth 2 (two) times daily as needed.   Narcan 4 MG/0.1ML Liqd nasal spray kit Generic drug: naloxone SPRAY 0.1 ML (4 MG) IN 1 NOSTRIL/ MAY REPEAT DOSE EVERY 2 3 MINUTES AS NEEDED ALTERNATING NOSTRILS   oxycodone  30 MG immediate release tablet Commonly known as: ROXICODONE Take 1 tablet by mouth 5 (five) times daily as needed.   pantoprazole 40 MG tablet Commonly known as: Protonix Take 1 tablet (40 mg total) by mouth daily.   propranolol 40 MG tablet Commonly known as: INDERAL TAKE 1 TABLET(40 MG) BY MOUTH TWICE DAILY   vitamin B-12 1000 MCG tablet Commonly known as: CYANOCOBALAMIN Take 1 tablet (1,000 mcg total) by mouth daily.   Vitamin D3 10 MCG (400 UNIT) Caps Take 4 capsules by mouth daily.            Discharge Care Instructions  (From admission, onward)         Start     Ordered   12/18/20 0000  Discharge wound care:       Comments: Follow with RN, refer to wc outpatient   12/18/20 0919          Follow-up Information    Schedule an appointment as soon as possible for a visit  with [name], FNP.   Specialty: Family Medicine Contact information: [address] [place] [address] [phone]        Sindy Guadeloupe, MD Follow up in 2 week(s).   Specialty: Oncology Contact  information: [place] [address] [phone]        Virgel Manifold, MD Follow up in 2 week(s).   Specialty: Gastroenterology Contact information: [place] [address] [phone]              32 minutes Signed: Sharen Hones 12/18/2020, 9:19 AM

## 2020-12-18 NOTE — Progress Notes (Signed)
Pt awaiting a ride. IVS out ready to go

## 2020-12-18 NOTE — Plan of Care (Signed)
Ready for discharge

## 2020-12-18 NOTE — Plan of Care (Signed)

## 2020-12-19 ENCOUNTER — Telehealth: Payer: Self-pay

## 2020-12-19 ENCOUNTER — Encounter: Payer: Self-pay | Admitting: Gastroenterology

## 2020-12-19 LAB — CULTURE, BLOOD (ROUTINE X 2)
Culture: NO GROWTH
Culture: NO GROWTH

## 2020-12-19 NOTE — Telephone Encounter (Signed)
Transition Care Management Unsuccessful Follow-up Telephone Call  Date of discharge and from where:  12/18/20 from [facility]  Attempts:  1st Attempt  Reason for unsuccessful TCM follow-up call:  Unable to reach patient HFU scheduled 12/21/20. Will follow.

## 2020-12-19 NOTE — Progress Notes (Signed)
Subjective:    Patient ID: [name]., male    DOB: 1951-01-06, 70 y.o.   MRN: [id number]  CC: [name]. is a 70 y.o. male who presents today for follow up.   HPI: Accompanied by caregiver  Feels well today No new concerns  Redness and pain improved in left lower extremity. He has wrapped with ace wrap today. He is sitting in wheelchair.   Wound care is coming to the house today.   He plans to pick up protonix today. He isnt sure if he taking is lasix since discharge.   Fatigued has improved.  No fever, chills, sob.    hypothyroidism- he has be out of synthroid and has misplaced this medication.   Presented to ED for chills, fever, and suspected cellulitis. Admitted for left lower extremity cellulitis and pancytopenia  12/14/20 and discharged 12/18/20  Hospital course: left lower extremity negative for dvt,  hemoglobin at admission 6.1 and received 2 units PRBC transfusion EGD showed gastric varices, no bleeding. Colonoscopy showed polyps Treated with rocephin then changed to unasyn. Discharged on augmentin and doxycycline. 5 days of PO lasix 78m Blood culture - no growth in 5 days 12/14/20  He was started on protonix.  Following with hematology Referred to wound care as outpatient  H/o right AKA  TSH 7 days ago. He has been taking synthroid 588m  Echo 2019 LV EF 55-60%  HISTORY:  Past Medical History:  Diagnosis Date  . AKA, RIGHT, HX OF 12/23/2007   Qualifier: Diagnosis of  By: ElLoanne DrillingD, SeJacelyn Pi . ANXIETY 03/24/2007  . Chronic pain   . CIRRHOSIS 03/24/2007  . 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 FRACTURE, WRIST, LEFT 10/12/2009   Qualifier: Diagnosis of  By: ElLoanne DrillingD, SeJacelyn Pi . HEPATITIS C 03/24/2007  . HYPERLIPIDEMIA 09/12/2009  . Hypocalcemia 12/23/2007   Qualifier: Diagnosis of  By: ElLoanne DrillingD, SeJacelyn Pi . Pancytopenia 12/23/2007  . 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 Squamous cell carcinoma of skin 05/21/2018   R mid volar  forearm  . Thyroid disease   . UROLITHIASIS, HX OF 12/23/2007   Past Surgical History:  Procedure Laterality Date  . ABOVE KNEE LEG AMPUTATION Right    Right femur fracture, 11/1994, resulted in right AKA 2006 after staph infection  . COLONOSCOPY N/A 12/16/2020   Procedure: COLONOSCOPY;  Surgeon: TaVirgel ManifoldMD;  [facility];  Service: Endoscopy;  Laterality: N/A;  . ESOPHAGOGASTRODUODENOSCOPY N/A 12/16/2020   Procedure: ESOPHAGOGASTRODUODENOSCOPY (EGD);  Surgeon: TaVirgel ManifoldMD;  [facility];  Service: Endoscopy;  Laterality: N/A;   Family History  Problem Relation Age of Onset  . Heart disease Mother   . Lung cancer Mother [id number]. Heart attack Paternal Grandmother   . Heart attack Paternal Grandfather   . Liver cancer Neg Hx     Allergies: Clindamycin Current Outpatient Medications on File Prior to Visit  Medication Sig Dispense Refill  . ALPRAZolam (XANAX) 1 MG tablet Take 1 mg by mouth 3 (three) times daily.    . Marland Kitchenmoxicillin-clavulanate (AUGMENTIN) 875-125 MG tablet Take 1 tablet by mouth 2 (two) times daily for 5 days. 10 tablet 0  . Cholecalciferol (VITAMIN D3) 10 MCG (400 UNIT) CAPS Take 4 capsules by mouth daily.    . Marland Kitchenoxycycline (VIBRA-TABS) 100 MG tablet Take 1 tablet (100 mg total) by mouth every 12 (twelve) hours for 5 days. 10 tablet 0  . folic  acid (FOLVITE) 1 MG tablet TAKE 1 TABLET(1 MG) BY MOUTH DAILY 30 tablet 3  . furosemide (LASIX) 40 MG tablet Take 1 tablet (40 mg total) by mouth daily for 5 days. 5 tablet 0  . losartan (COZAAR) 100 MG tablet TAKE 1 TABLET(100 MG) BY MOUTH DAILY 90 tablet 1  . lubiprostone (AMITIZA) 24 MCG capsule Take 24 mcg by mouth daily as needed.    Marland Kitchen morphine (MS CONTIN) 15 MG 12 hr tablet Take 15 mg by mouth 2 (two) times daily as needed.    Marland Kitchen NARCAN 4 MG/0.1ML LIQD nasal spray kit SPRAY 0.1 ML (4 MG) IN 1 NOSTRIL/ MAY REPEAT DOSE EVERY 2 3 MINUTES AS NEEDED ALTERNATING NOSTRILS    . oxycodone  (ROXICODONE) 30 MG immediate release tablet Take 1 tablet by mouth 5 (five) times daily as needed.    . pantoprazole (PROTONIX) 40 MG tablet Take 1 tablet (40 mg total) by mouth daily. 30 tablet 0  . propranolol (INDERAL) 40 MG tablet TAKE 1 TABLET(40 MG) BY MOUTH TWICE DAILY 180 tablet 1  . vitamin B-12 (CYANOCOBALAMIN) 1000 MCG tablet Take 1 tablet (1,000 mcg total) by mouth daily.     No current facility-administered medications on file prior to visit.    Social History   Tobacco Use  . Smoking status: Never Smoker  . Smokeless tobacco: Never Used  Substance Use Topics  . Alcohol use: Not Currently    Alcohol/week: 0.0 standard drinks  . Drug use: No    Review of Systems  Constitutional: Positive for fatigue (improving). Negative for chills and fever.  Respiratory: Negative for cough.   Cardiovascular: Positive for leg swelling. Negative for chest pain and palpitations.  Gastrointestinal: Negative for nausea and vomiting.  Skin: Positive for wound. Negative for rash.      Objective:    BP 122/60   Pulse 67   Temp 98.4 F (36.9 C)   Ht 6' (1.829 m)   Wt 210 lb (95.3 kg)   SpO2 96%   BMI 28.48 kg/m  BP Readings from Last 3 Encounters:  12/21/20 122/60  12/18/20 135/63  12/14/20 (!) 120/50   Wt Readings from Last 3 Encounters:  12/21/20 210 lb (95.3 kg)  12/14/20 220 lb 7.4 oz (100 kg)  08/08/20 220 lb (99.8 kg)    Physical Exam Vitals reviewed.  Constitutional:      Appearance: He is well-developed.  Cardiovascular:     Rate and Rhythm: Regular rhythm.     Heart sounds: Normal heart sounds.     Comments: RLE edema, +1 , non pitting.  Marland Kitchen No palpable cords or masses. Slight erythema noted dorsal aspect of right foot. Dry scaling skin. No  increased warmth.  No discoloration or varicosities noted. LE warm and palpable pedal pulses.  Pulmonary:     Effort: Pulmonary effort is normal. No respiratory distress.     Breath sounds: Normal breath sounds. No  wheezing, rhonchi or rales.  Musculoskeletal:     Comments: Right aka  Skin:    General: Skin is warm and dry.  Neurological:     Mental Status: He is alert.  Psychiatric:        Speech: Speech normal.        Behavior: Behavior normal.        Assessment & Plan:   Problem List Items Addressed This Visit      Endocrine   Hypothyroidism    Uncontrolled. Suspect contributing to his fatigue. Refilled medication today  as not sure if he has been taking. Counseled on how to take medication. TSH in 6 weeks.       Relevant Medications   levothyroxine (SYNTHROID) 50 MCG tablet   Other Relevant Orders   TSH     Other   Cellulitis of left lower extremity - Primary    Improved. Reviewed hospital course and medications reconciled. Reiterated the importance of taking lasix 21m for 5 days and completion of augmentin, doxycycline. Appointment with wound care today in home and folllow up with GI, hematology arranged for next month. Pending BMP and echocardiogram due to edema. Close follow up in one week's time.       Relevant Orders   ECHOCARDIOGRAM COMPLETE   Basic metabolic panel   Magnesium    Other Visit Diagnoses    Lower extremity edema       Relevant Orders   ECHOCARDIOGRAM COMPLETE       I am having Derek C. Hagadorn Jr. maintain his ALPRAZolam, oxycodone, vitamin B-12, lubiprostone, Narcan, Vitamin D3, morphine, folic acid, losartan, propranolol, amoxicillin-clavulanate, doxycycline, furosemide, pantoprazole, and levothyroxine.   Meds ordered this encounter  Medications  . levothyroxine (SYNTHROID) 50 MCG tablet    Sig: TAKE 1 TABLET(50 MCG) BY MOUTH DAILY BEFORE BREAKFAST    Dispense:  90 tablet    Refill:  1    **Patient requests 90 days supply**ZERO refills remain on this prescription. Your patient is requesting advance approval [place] for this medication to [place]   Order Specific Question:   Supervising Provider    Answer:   TCrecencio Mc [[id number]]    Return precautions given.   Risks, benefits, and alternatives of the medications and treatment plan prescribed today were discussed, and patient expressed understanding.   Education regarding symptom management and diagnosis given to patient on AVS.  Continue to follow with ABurnard Hawthorne FNP for routine health maintenance.   JDonne AnonGleason Jr. and I agreed with plan.   MMable Paris FNP

## 2020-12-20 LAB — SURGICAL PATHOLOGY

## 2020-12-20 NOTE — Telephone Encounter (Signed)
Transition Care Management Follow-up Telephone Call  Date of discharge and from where: 12/18/20 from [facility]  How have you been since you were released from the hospital? Resting well. Wound bandage clean/dry. Bandage change daily.   Any questions or concerns? No   Items Reviewed:  Did the pt receive and understand the discharge instructions provided? Yes   Medications obtained and verified? Yes   Other? 5 days oral lasix at discharge.  Any new allergies since your discharge? No   Dietary orders reviewed? Low sodium heart healthy.  Do you have support at home? Yes   Home Care and Equipment/Supplies: Home health services already in place.   Functional Questionnaire: (I = Independent and D = Dependent) ADLs: Assisted daily  Bathing/Dressing- D  Meal Prep- D  Eating- I  Maintaining continence- I  Transferring/Ambulation- Transport planner, wheelchair  Managing Meds- Aide assist  Follow up appointments reviewed:   PCP Hospital f/u appt confirmed? Yes  Scheduled to see Pcp on 12/21/20 @ 10:00.  [facility] f/u appt confirmed? Not yet scheduled. Declines writing number to Oncology and Gertie Fey during call. Requests to pick up during visit with pcp. Information printed for patient and given to cma.   Are transportation arrangements needed? No   If their condition worsens, is the pt aware to call PCP or go to the Emergency Dept.? Yes  Was the patient provided with contact information for the PCP's office or ED? Yes  Was to pt encouraged to call back with questions or concerns? Yes

## 2020-12-21 ENCOUNTER — Ambulatory Visit (INDEPENDENT_AMBULATORY_CARE_PROVIDER_SITE_OTHER): Payer: Medicare Other | Admitting: Family

## 2020-12-21 ENCOUNTER — Encounter: Payer: Self-pay | Admitting: Family

## 2020-12-21 ENCOUNTER — Other Ambulatory Visit: Payer: Self-pay

## 2020-12-21 ENCOUNTER — Telehealth: Payer: Self-pay | Admitting: Family

## 2020-12-21 ENCOUNTER — Telehealth: Payer: Self-pay | Admitting: Oncology

## 2020-12-21 VITALS — BP 122/60 | HR 67 | Temp 98.4°F | Ht 72.0 in | Wt 210.0 lb

## 2020-12-21 DIAGNOSIS — R600 Localized edema: Secondary | ICD-10-CM

## 2020-12-21 DIAGNOSIS — L03116 Cellulitis of left lower limb: Secondary | ICD-10-CM | POA: Diagnosis not present

## 2020-12-21 DIAGNOSIS — E039 Hypothyroidism, unspecified: Secondary | ICD-10-CM

## 2020-12-21 LAB — BASIC METABOLIC PANEL
BUN: 14 mg/dL (ref 6–23)
CO2: 28 mEq/L (ref 19–32)
Calcium: 9 mg/dL (ref 8.4–10.5)
Chloride: 99 mEq/L (ref 96–112)
Creatinine, Ser: 0.59 mg/dL (ref 0.40–1.50)
GFR: 97.64 mL/min (ref >60.00)
Glucose, Bld: 134 mg/dL — ABNORMAL HIGH (ref 70–99)
Potassium: 3.9 mEq/L (ref 3.5–5.1)
Sodium: 136 mEq/L (ref 135–145)

## 2020-12-21 LAB — MAGNESIUM: Magnesium: 1.3 mg/dL — ABNORMAL LOW (ref 1.5–2.5)

## 2020-12-21 MED ORDER — LEVOTHYROXINE SODIUM 50 MCG PO TABS: ORAL_TABLET | ORAL | 1 refills | Status: DC

## 2020-12-21 NOTE — Patient Instructions (Addendum)
I have ordered Echocardiogram and arranged follow up appointment with Dr Janese Banks and Dr Allen Norris  Let us know if you dont hear back within a week in regards to an appointment being scheduled.   Please confirm that you are taking lasix 40mg  daily for 5 days after discharge.  Please also confirm if you have been taking levothyroxine 18mcg daily.   Please also ensure you are taking synthroid correctly as this can affect thyroid levels-  Take Synthroid once a day, every day at the same time before breakfast. Take Synthroid with only water and on an empty stomach. Wait 30 minutes to 1 hour before eating or drinking anything other than water.Please also separated by >4 hours from acid reflux medications, calcium, iron, multivitamins as may interfere with absorption.   Please complete antibiotics ( doxycycline and augmentin) and if any worsening of redness, swelling, please return to emergency room  Ensure to take probiotics while on antibiotics and also for 2 weeks after completion. This can either be by eating yogurt daily or taking a probiotic supplement over the counter such as Culturelle.It is important to re-colonize the gut with good bacteria and also to prevent any diarrheal infections associated with antibiotic use.    Cellulitis, Adult  Cellulitis is a skin infection. The infected area is often warm, red, swollen, and sore. It occurs most often in the arms and lower legs. It is very important to get treated for this condition. What are the causes? This condition is caused by bacteria. The bacteria enter through a break in the skin, such as a cut, burn, insect bite, open sore, or crack. What increases the risk? This condition is more likely to occur in people who:  Have a weak body defense system (immune system).  Have open cuts, burns, bites, or scrapes on the skin.  Are older than 72 years of age.  Have a blood sugar problem (diabetes).  Have a long-lasting (chronic) liver disease  (cirrhosis) or kidney disease.  Are very overweight (obese).  Have a skin problem, such as: ? Itchy rash (eczema). ? Slow movement of blood in the veins (venous stasis). ? Fluid buildup below the skin (edema).  Have been treated with high-energy rays (radiation).  Use IV drugs. What are the signs or symptoms? Symptoms of this condition include:  Skin that is: ? Red. ? Streaking. ? Spotting. ? Swollen. ? Sore or painful when you touch it. ? Warm.  A fever.  Chills.  Blisters. How is this diagnosed? This condition is diagnosed based on:  Medical history.  Physical exam.  Blood tests.  Imaging tests. How is this treated? Treatment for this condition may include:  Medicines to treat infections or allergies.  Home care, such as: ? Rest. ? Placing cold or warm cloths (compresses) on the skin.  Hospital care, if the condition is very bad. Follow these instructions at home: Medicines  Take over-the-counter and prescription medicines only as told by your doctor.  If you were prescribed an antibiotic medicine, take it as told by your doctor. Do not stop taking it even if you start to feel better. General instructions  Drink enough fluid to keep your pee (urine) pale yellow.  Do not touch or rub the infected area.  Raise (elevate) the infected area above the level of your heart while you are sitting or lying down.  Place cold or warm cloths on the area as told by your doctor.  Keep all follow-up visits as told by your doctor.  This is important.   Contact a doctor if:  You have a fever.  You do not start to get better after 1-2 days of treatment.  Your bone or joint under the infected area starts to hurt after the skin has healed.  Your infection comes back. This can happen in the same area or another area.  You have a swollen bump in the area.  You have new symptoms.  You feel ill and have muscle aches and pains. Get help right away if:  Your  symptoms get worse.  You feel very sleepy.  You throw up (vomit) or have watery poop (diarrhea) for a long time.  You see red streaks coming from the area.  Your red area gets larger.  Your red area turns dark in color. These symptoms may represent a serious problem that is an emergency. Do not wait to see if the symptoms will go away. Get medical help right away. Call your local emergency services (911 in the [place] Do not drive yourself to the hospital. Summary  Cellulitis is a skin infection. The area is often warm, red, swollen, and sore.  This condition is treated with medicines, rest, and cold and warm cloths.  Take all medicines only as told by your doctor.  Tell your doctor if symptoms do not start to get better after 1-2 days of treatment. This information is not intended to replace advice given to you by your health care provider. Make sure you discuss any questions you have with your health care provider. Document Revised: 12/05/2017 Document Reviewed: 12/05/2017 Elsevier Patient Education  [place].

## 2020-12-21 NOTE — Telephone Encounter (Signed)
Call and make f/u appt with dr Janese Banks and dr Allen Norris in 2 weeks per discharge instructions

## 2020-12-21 NOTE — Telephone Encounter (Signed)
Dr. Harlan Stains office called to schedule patient for 2 week hospital f/u.  I scheduled him to see Lorretta Harp on 6/8 as Dr. Janese Banks is on PAL.  She also mentioned that there was an ultrasound of the abd ordered in December that the patient was a NS for.  Do you want that rescheduled?

## 2020-12-21 NOTE — Assessment & Plan Note (Signed)
Uncontrolled. Suspect contributing to his fatigue. Refilled medication today as not sure if he has been taking. Counseled on how to take medication. TSH in 6 weeks.

## 2020-12-21 NOTE — Assessment & Plan Note (Signed)
Improved. Reviewed hospital course and medications reconciled. Reiterated the importance of taking lasix 40mg  for 5 days and completion of augmentin, doxycycline. Appointment with wound care today in home and folllow up with GI, hematology arranged for next month. Pending BMP and echocardiogram due to edema. Close follow up in one week's time.

## 2020-12-21 NOTE — Telephone Encounter (Signed)
Please get US abdomen before appt if possible

## 2020-12-21 NOTE — Telephone Encounter (Signed)
Done

## 2020-12-23 ENCOUNTER — Telehealth: Payer: Self-pay

## 2020-12-23 NOTE — Telephone Encounter (Signed)
Unfortunately, not able to get a [place] until 6/29. Spoke with patient to notify him of appointment.

## 2020-12-23 NOTE — Telephone Encounter (Signed)
-----   Message from Virgel Manifold, MD sent at 12/21/2020  1:19 PM EDT ----- [name] this pt has clinic follow up with Dr. Allen Norris in about a month. He was just in the hospital with acute on chronic anemia and FOBT positive.   He will need repeat colonoscopy for removal of polyps seen during his colonoscopy that could not be removed during the inpatient procedure due to low platelet counts. Please let him know the polyp biopsied in his stomach was hyperplastic, which is benign, but given its size, may also need removal (will refer to Dr. Allen Norris to determine if he would do that himself or refer out for this).  He will need all the above addressed during his next clinic visit. Thank you

## 2020-12-23 NOTE — Telephone Encounter (Signed)
Pt notified of results. Pt confirmed office appt with Dr. Allen Norris on 01/26/21 at 1:45pm.

## 2020-12-28 ENCOUNTER — Encounter: Payer: Self-pay | Admitting: Family

## 2020-12-28 ENCOUNTER — Ambulatory Visit (INDEPENDENT_AMBULATORY_CARE_PROVIDER_SITE_OTHER): Payer: Medicare Other | Admitting: Family

## 2020-12-28 ENCOUNTER — Other Ambulatory Visit: Payer: Self-pay

## 2020-12-28 ENCOUNTER — Telehealth: Payer: Self-pay | Admitting: *Deleted

## 2020-12-28 VITALS — BP 124/62 | HR 65 | Temp 98.2°F | Ht 72.0 in

## 2020-12-28 DIAGNOSIS — L03116 Cellulitis of left lower limb: Secondary | ICD-10-CM

## 2020-12-28 LAB — BASIC METABOLIC PANEL
BUN: 16 mg/dL (ref 6–23)
CO2: 25 mEq/L (ref 19–32)
Calcium: 9 mg/dL (ref 8.4–10.5)
Chloride: 104 mEq/L (ref 96–112)
Creatinine, Ser: 0.75 mg/dL (ref 0.40–1.50)
GFR: 90.8 mL/min (ref >60.00)
Glucose, Bld: 135 mg/dL — ABNORMAL HIGH (ref 70–99)
Potassium: 4.2 mEq/L (ref 3.5–5.1)
Sodium: 140 mEq/L (ref 135–145)

## 2020-12-28 LAB — CBC WITH DIFFERENTIAL/PLATELET
Basophils Absolute: 0 10*3/uL (ref 0.0–0.1)
Basophils Relative: 0.6 % (ref 0.0–3.0)
Eosinophils Absolute: 0.1 10*3/uL (ref 0.0–0.7)
Eosinophils Relative: 5 % (ref 0.0–5.0)
HCT: 24.4 % — ABNORMAL LOW (ref 39.0–52.0)
Hemoglobin: 8.3 g/dL — ABNORMAL LOW (ref 13.0–17.0)
Lymphocytes Relative: 23.6 % (ref 12.0–46.0)
Lymphs Abs: 0.6 10*3/uL — ABNORMAL LOW (ref 0.7–4.0)
MCHC: 34.1 g/dL (ref 30.0–36.0)
MCV: 96.7 fl (ref 78.0–100.0)
Monocytes Absolute: 0.2 10*3/uL (ref 0.1–1.0)
Monocytes Relative: 8 % (ref 3.0–12.0)
Neutro Abs: 1.7 10*3/uL (ref 1.4–7.7)
Neutrophils Relative %: 62.8 % (ref 43.0–77.0)
Platelets: 36 10*3/uL — CL (ref 150.0–400.0)
RBC: 2.52 Mil/uL — ABNORMAL LOW (ref 4.22–5.81)
RDW: 23.7 % — ABNORMAL HIGH (ref 11.5–15.5)
WBC: 2.6 10*3/uL — ABNORMAL LOW (ref 4.0–10.5)

## 2020-12-28 LAB — MAGNESIUM: Magnesium: 1.5 mg/dL (ref 1.5–2.5)

## 2020-12-28 MED ORDER — DOXYCYCLINE HYCLATE 100 MG PO TABS: 100.0000 mg | ORAL_TABLET | Freq: Two times a day (BID) | ORAL | 0 refills | Status: DC

## 2020-12-28 MED ORDER — FUROSEMIDE 40 MG PO TABS
40.0000 mg | ORAL_TABLET | Freq: Every day | ORAL | 0 refills | Status: DC
Start: 2020-12-28 — End: 2021-01-03

## 2020-12-28 MED ORDER — AMOXICILLIN-POT CLAVULANATE 875-125 MG PO TABS: 1.0000 | ORAL_TABLET | Freq: Two times a day (BID) | ORAL | 0 refills | Status: AC

## 2020-12-28 NOTE — Telephone Encounter (Signed)
CRITICAL VALUE STICKER  CRITICAL VALUE: [phone]  RECEIVER (on-site recipient of call): Jari Favre, CMA/XT  DATE & TIME NOTIFIED:  12/28/20 @ 4:05pm  MESSENGER (representative from lab): Santiago Glad @ [facility]  MD NOTIFIED: Mable Paris, NP  TIME OF NOTIFICATION: 4:06pm  RESPONSE: see result

## 2020-12-28 NOTE — Telephone Encounter (Signed)
Noted Patient has h/o cirrhosis and platelets at baseline from 10 days ago

## 2020-12-28 NOTE — Progress Notes (Signed)
Subjective:    Patient ID: Derek Rough., male    DOB: 1951-01-11, 70 y.o.   MRN: [id number]  CC: Derek Germond Freer [name]. is a 70 y.o. male who presents today for follow up.   HPI: Left lower extremity swelling is unchanged. He doesn't feel increased warmth and denies fever. Redness is still present. No drainage. He has not had left leg wrapped.  He is not sure if if he completed augmentin and doxycycline at discharge from hospital.   Caregiver was not present. She comes daily 5 days per week, and stays 2 hours.  Wound care came last week and hasnt returned.   He continues to feel fatigued.   Follow up with Dr Allen Norris 01/26/21  Anemia- follow up with hematology 01/04/21  [place] left LLE 12/14/20 revealed no evidence of DVT        HISTORY:  Past Medical History:  Diagnosis Date  . 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 ANXIETY 03/24/2007  . Chronic pain   . CIRRHOSIS 03/24/2007  . 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 FRACTURE, WRIST, LEFT 10/12/2009   Qualifier: Diagnosis of  By: Loanne Drilling MD, Jacelyn Pi   . HEPATITIS C 03/24/2007  . HYPERLIPIDEMIA 09/12/2009  . Hypocalcemia 12/23/2007   Qualifier: Diagnosis of  By: Loanne Drilling MD, Jacelyn Pi   . Pancytopenia 12/23/2007  . 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 Squamous cell carcinoma of skin 05/21/2018   R mid volar forearm  . Thyroid disease   . UROLITHIASIS, HX OF 12/23/2007   Past Surgical History:  Procedure Laterality Date  . ABOVE KNEE LEG AMPUTATION Right    Right femur fracture, 11/1994, resulted in right AKA 2006 after staph infection  . COLONOSCOPY N/A 12/16/2020   Procedure: COLONOSCOPY;  Surgeon: Virgel Manifold, MD;  Location: [facility] ENDOSCOPY;  Service: Endoscopy;  Laterality: N/A;  . ESOPHAGOGASTRODUODENOSCOPY N/A 12/16/2020   Procedure: ESOPHAGOGASTRODUODENOSCOPY (EGD);  Surgeon: Virgel Manifold, MD;  Location: [facility] ENDOSCOPY;  Service: Endoscopy;  Laterality: N/A;    Family History  Problem Relation Age of Onset  . Heart disease Mother   . Lung cancer Mother 45  . Heart attack Paternal Grandmother   . Heart attack Paternal Grandfather   . Liver cancer Neg Hx     Allergies: Clindamycin Current Outpatient Medications on File Prior to Visit  Medication Sig Dispense Refill  . ALPRAZolam (XANAX) 1 MG tablet Take 1 mg by mouth 3 (three) times daily.    . Cholecalciferol (VITAMIN D3) 10 MCG (400 UNIT) CAPS Take 4 capsules by mouth daily.    . folic acid (FOLVITE) 1 MG tablet TAKE 1 TABLET(1 MG) BY MOUTH DAILY 30 tablet 3  . levothyroxine (SYNTHROID) 50 MCG tablet TAKE 1 TABLET(50 MCG) BY MOUTH DAILY BEFORE BREAKFAST 90 tablet 1  . losartan (COZAAR) 100 MG tablet TAKE 1 TABLET(100 MG) BY MOUTH DAILY 90 tablet 1  . lubiprostone (AMITIZA) 24 MCG capsule Take 24 mcg by mouth daily as needed.    Marland Kitchen morphine (MS CONTIN) 15 MG 12 hr tablet Take 15 mg by mouth 2 (two) times daily as needed.    Marland Kitchen NARCAN 4 MG/0.1ML LIQD nasal spray kit SPRAY 0.1 ML (4 MG) IN 1 NOSTRIL/ MAY REPEAT DOSE EVERY 2 3 MINUTES AS NEEDED ALTERNATING NOSTRILS    . oxycodone (ROXICODONE) 30 MG immediate release tablet Take 1 tablet by mouth 5 (five) times daily as needed.    Marland Kitchen  pantoprazole (PROTONIX) 40 MG tablet Take 1 tablet (40 mg total) by mouth daily. 30 tablet 0  . propranolol (INDERAL) 40 MG tablet TAKE 1 TABLET(40 MG) BY MOUTH TWICE DAILY 180 tablet 1  . vitamin B-12 (CYANOCOBALAMIN) 1000 MCG tablet Take 1 tablet (1,000 mcg total) by mouth daily.     No current facility-administered medications on file prior to visit.    Social History   Tobacco Use  . Smoking status: Never Smoker  . Smokeless tobacco: Never Used  Substance Use Topics  . Alcohol use: Not Currently    Alcohol/week: 0.0 standard drinks  . Drug use: No    Review of Systems  Constitutional: Positive for fatigue. Negative for chills and fever.  Respiratory: Negative for cough and shortness of breath.    Cardiovascular: Positive for leg swelling (left). Negative for chest pain and palpitations.  Gastrointestinal: Negative for nausea and vomiting.      Objective:    BP 124/62 (BP Location: Left Arm, Patient Position: Sitting, Cuff Size: Large)   Pulse 65   Temp 98.2 F (36.8 C) (Oral)   Ht 6' (1.829 m)   SpO2 94%   BMI 28.48 kg/m  BP Readings from Last 3 Encounters:  12/29/20 96/63  12/28/20 124/62  12/21/20 122/60   Wt Readings from Last 3 Encounters:  12/29/20 210 lb (95.3 kg)  12/21/20 210 lb (95.3 kg)  12/14/20 220 lb 7.4 oz (100 kg)    Physical Exam Vitals reviewed.  Constitutional:      Appearance: He is well-developed.  Cardiovascular:     Rate and Rhythm: Regular rhythm.     Heart sounds: Normal heart sounds.     Comments: Left AKA.  +2 RLE edema. No palpable cords or masses. No increased warmth.  Hyper erythematous along dorsal aspect of left foot.   Pulmonary:     Effort: Pulmonary effort is normal. No respiratory distress.     Breath sounds: Normal breath sounds. No wheezing, rhonchi or rales.  Skin:    General: Skin is warm and dry.  Neurological:     Mental Status: He is alert.  Psychiatric:        Speech: Speech normal.        Behavior: Behavior normal.        Assessment & Plan:   Problem List Items Addressed This Visit      Other   Cellulitis of left lower extremity - Primary    Patient well appearing and not septic in appearance. Concerned that he didn't finished augmentin, doxycycline and have refilled for 5 day course. Concern for continued cellulitis versus lymphedema.  Will arrange Infectious disease and vascular consult. Home health referral as I am concerned for patient's safety and medication adherence. Close follow up.        Relevant Medications   amoxicillin-clavulanate (AUGMENTIN) 875-125 MG tablet   doxycycline (VIBRA-TABS) 100 MG tablet   furosemide (LASIX) 40 MG tablet   Other Relevant Orders   Magnesium (Completed)    Ambulatory referral to Infectious Disease   Basic metabolic panel (Completed)   CBC with Differential/Platelet (Completed)   Ambulatory referral to Vascular Surgery   Ambulatory referral to [place]       I am having Derek C. Corales Jr. start on amoxicillin-clavulanate and doxycycline. I am also having him maintain his ALPRAZolam, oxycodone, vitamin B-12, lubiprostone, Narcan, Vitamin D3, morphine, folic acid, losartan, propranolol, pantoprazole, levothyroxine, and furosemide.   Meds ordered this encounter  Medications  . amoxicillin-clavulanate (  AUGMENTIN) 875-125 MG tablet    Sig: Take 1 tablet by mouth 2 (two) times daily for 5 days.    Dispense:  14 tablet    Refill:  0    Order Specific Question:   Supervising Provider    Answer:   Deborra Medina L [[id number]]  . doxycycline (VIBRA-TABS) 100 MG tablet    Sig: Take 1 tablet (100 mg total) by mouth 2 (two) times daily.    Dispense:  10 tablet    Refill:  0    Order Specific Question:   Supervising Provider    Answer:   Deborra Medina L [[id number]]  . furosemide (LASIX) 40 MG tablet    Sig: Take 1 tablet (40 mg total) by mouth daily for 5 days.    Dispense:  5 tablet    Refill:  0    Order Specific Question:   Supervising Provider    Answer:   Crecencio Mc [[id number]]    Return precautions given.   Risks, benefits, and alternatives of the medications and treatment plan prescribed today were discussed, and patient expressed understanding.   Education regarding symptom management and diagnosis given to patient on AVS.  Continue to follow with Derek Hawthorne, FNP for routine health maintenance.   Derek Anon Propes Jr. and I agreed with plan.   Mable Paris, FNP

## 2020-12-28 NOTE — Patient Instructions (Addendum)
Referral in place for infectious disease as concern for lack of resolution of skin infection  Start augmentin and doxycycline - it is very important to complete this course  Ensure to take probiotics while on antibiotics and also for 2 weeks after completion. This can either be by eating yogurt daily or taking a probiotic supplement over the counter such as Culturelle.It is important to re-colonize the gut with good bacteria and also to prevent any diarrheal infections associated with antibiotic use.   Start 5 days of lasix to see if swelling improves as well  Call me with any concerns of redness, swelling or pain.    Cellulitis, Adult  Cellulitis is a skin infection. The infected area is often warm, red, swollen, and sore. It occurs most often in the arms and lower legs. It is very important to get treated for this condition. What are the causes? This condition is caused by bacteria. The bacteria enter through a break in the skin, such as a cut, burn, insect bite, open sore, or crack. What increases the risk? This condition is more likely to occur in people who:  Have a weak body defense system (immune system).  Have open cuts, burns, bites, or scrapes on the skin.  Are older than 72 years of age.  Have a blood sugar problem (diabetes).  Have a long-lasting (chronic) liver disease (cirrhosis) or kidney disease.  Are very overweight (obese).  Have a skin problem, such as: ? Itchy rash (eczema). ? Slow movement of blood in the veins (venous stasis). ? Fluid buildup below the skin (edema).  Have been treated with high-energy rays (radiation).  Use IV drugs. What are the signs or symptoms? Symptoms of this condition include:  Skin that is: ? Red. ? Streaking. ? Spotting. ? Swollen. ? Sore or painful when you touch it. ? Warm.  A fever.  Chills.  Blisters. How is this diagnosed? This condition is diagnosed based on:  Medical history.  Physical exam.  Blood  tests.  Imaging tests. How is this treated? Treatment for this condition may include:  Medicines to treat infections or allergies.  Home care, such as: ? Rest. ? Placing cold or warm cloths (compresses) on the skin.  Hospital care, if the condition is very bad. Follow these instructions at home: Medicines  Take over-the-counter and prescription medicines only as told by your doctor.  If you were prescribed an antibiotic medicine, take it as told by your doctor. Do not stop taking it even if you start to feel better. General instructions  Drink enough fluid to keep your pee (urine) pale yellow.  Do not touch or rub the infected area.  Raise (elevate) the infected area above the level of your heart while you are sitting or lying down.  Place cold or warm cloths on the area as told by your doctor.  Keep all follow-up visits as told by your doctor. This is important.   Contact a doctor if:  You have a fever.  You do not start to get better after 1-2 days of treatment.  Your bone or joint under the infected area starts to hurt after the skin has healed.  Your infection comes back. This can happen in the same area or another area.  You have a swollen bump in the area.  You have new symptoms.  You feel ill and have muscle aches and pains. Get help right away if:  Your symptoms get worse.  You feel very sleepy.  You  throw up (vomit) or have watery poop (diarrhea) for a long time.  You see red streaks coming from the area.  Your red area gets larger.  Your red area turns dark in color. These symptoms may represent a serious problem that is an emergency. Do not wait to see if the symptoms will go away. Get medical help right away. Call your local emergency services (911 in the [place] Do not drive yourself to the hospital. Summary  Cellulitis is a skin infection. The area is often warm, red, swollen, and sore.  This condition is treated with medicines, rest, and  cold and warm cloths.  Take all medicines only as told by your doctor.  Tell your doctor if symptoms do not start to get better after 1-2 days of treatment. This information is not intended to replace advice given to you by your health care provider. Make sure you discuss any questions you have with your health care provider. Document Revised: 12/05/2017 Document Reviewed: 12/05/2017 Elsevier Patient Education  [place].

## 2020-12-29 ENCOUNTER — Ambulatory Visit: Payer: Medicare Other | Attending: Infectious Diseases | Admitting: Infectious Diseases

## 2020-12-29 ENCOUNTER — Telehealth: Payer: Self-pay | Admitting: Family

## 2020-12-29 ENCOUNTER — Encounter: Payer: Self-pay | Admitting: Infectious Diseases

## 2020-12-29 VITALS — BP 96/63 | HR 56 | Temp 97.9°F | Resp 16 | Ht 70.0 in | Wt 210.0 lb

## 2020-12-29 DIAGNOSIS — Z85828 Personal history of other malignant neoplasm of skin: Secondary | ICD-10-CM | POA: Insufficient documentation

## 2020-12-29 DIAGNOSIS — Z8249 Family history of ischemic heart disease and other diseases of the circulatory system: Secondary | ICD-10-CM | POA: Insufficient documentation

## 2020-12-29 DIAGNOSIS — L03116 Cellulitis of left lower limb: Secondary | ICD-10-CM | POA: Diagnosis present

## 2020-12-29 DIAGNOSIS — R161 Splenomegaly, not elsewhere classified: Secondary | ICD-10-CM | POA: Insufficient documentation

## 2020-12-29 DIAGNOSIS — Z89611 Acquired absence of right leg above knee: Secondary | ICD-10-CM | POA: Insufficient documentation

## 2020-12-29 DIAGNOSIS — K766 Portal hypertension: Secondary | ICD-10-CM | POA: Diagnosis not present

## 2020-12-29 DIAGNOSIS — E785 Hyperlipidemia, unspecified: Secondary | ICD-10-CM | POA: Diagnosis not present

## 2020-12-29 DIAGNOSIS — G894 Chronic pain syndrome: Secondary | ICD-10-CM | POA: Insufficient documentation

## 2020-12-29 DIAGNOSIS — K7460 Unspecified cirrhosis of liver: Secondary | ICD-10-CM | POA: Diagnosis not present

## 2020-12-29 DIAGNOSIS — L309 Dermatitis, unspecified: Secondary | ICD-10-CM | POA: Diagnosis not present

## 2020-12-29 DIAGNOSIS — I878 Other specified disorders of veins: Secondary | ICD-10-CM | POA: Diagnosis not present

## 2020-12-29 DIAGNOSIS — R609 Edema, unspecified: Secondary | ICD-10-CM | POA: Insufficient documentation

## 2020-12-29 DIAGNOSIS — B1920 Unspecified viral hepatitis C without hepatic coma: Secondary | ICD-10-CM | POA: Diagnosis not present

## 2020-12-29 DIAGNOSIS — I890 Lymphedema, not elsewhere classified: Secondary | ICD-10-CM | POA: Diagnosis not present

## 2020-12-29 DIAGNOSIS — Z79899 Other long term (current) drug therapy: Secondary | ICD-10-CM | POA: Insufficient documentation

## 2020-12-29 DIAGNOSIS — I872 Venous insufficiency (chronic) (peripheral): Secondary | ICD-10-CM | POA: Insufficient documentation

## 2020-12-29 DIAGNOSIS — D61818 Other pancytopenia: Secondary | ICD-10-CM | POA: Insufficient documentation

## 2020-12-29 NOTE — Telephone Encounter (Signed)
Patient called into get information and phone for referral

## 2020-12-29 NOTE — Progress Notes (Signed)
NAME: Derek Shukla Metivier Jr.  DOB: Mar 31, 1951  MRN: [id number]  Date/Time: 12/29/2020 12:15 PM  Subjective:   ?pt is referred by PCP for left leg cellulitis Donne Anon Berryman [name]. is a 70 y.o. male with a history of hepatitis C] possibly treated, cirrhosis, right AKA was recently hospitalized at [facility] between 12/14/2020 until 12/18/2020 for left lower extremity pain.  The leg was found to be swollen and erythematous and he was started on IV antibiotics for possible cellulitis with ceftriaxone.  He was also found to have acute on chronic anemia, chronic thrombocytopenia.  He received 2 units of PRBC.  On 12/16/2020 he underwent endoscopy that showed gastric varices with no bleeding but also several discolored areas in the esophagus and stomach.  There was no active bleeding.  Colonoscopy showed multiple polyps.  He was asked to follow-up as outpatient with GI and hematology. He was sent home on p.o. Augmentin. Patient today is in wheelchair.  He does not have any fever. He says he has had chronic edema legs He said he used to use Neosporin topically and that caused a rash on his leg. He lives on his own He is got chronic pain syndrome and is on multiple pain medications including morphine.   Past Medical History:  Diagnosis Date  . 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 ANXIETY 03/24/2007  . Chronic pain   . CIRRHOSIS 03/24/2007  . 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 FRACTURE, WRIST, LEFT 10/12/2009   Qualifier: Diagnosis of  By: Loanne Drilling MD, Jacelyn Pi   . HEPATITIS C 03/24/2007  . HYPERLIPIDEMIA 09/12/2009  . Hypocalcemia 12/23/2007   Qualifier: Diagnosis of  By: Loanne Drilling MD, Jacelyn Pi   . Pancytopenia 12/23/2007  . 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 Squamous cell carcinoma of skin 05/21/2018   R mid volar forearm  . Thyroid disease   . UROLITHIASIS, HX OF 12/23/2007    Past Surgical History:  Procedure Laterality Date  . ABOVE KNEE LEG AMPUTATION Right     Right femur fracture, 11/1994, resulted in right AKA 2006 after staph infection  . COLONOSCOPY N/A 12/16/2020   Procedure: COLONOSCOPY;  Surgeon: Virgel Manifold, MD;  [facility] ENDOSCOPY;  Service: Endoscopy;  Laterality: N/A;  . ESOPHAGOGASTRODUODENOSCOPY N/A 12/16/2020   Procedure: ESOPHAGOGASTRODUODENOSCOPY (EGD);  Surgeon: Virgel Manifold, MD;  Location: [facility] ENDOSCOPY;  Service: Endoscopy;  Laterality: N/A;    Social History   Socioeconomic History  . Marital status: Widowed    Spouse name: Not on file  . Number of children: Not on file  . Years of education: Not on file  . Highest education level: Not on file  Occupational History  . Occupation: Disabled  Tobacco Use  . Smoking status: Never Smoker  . Smokeless tobacco: Never Used  Substance and Sexual Activity  . Alcohol use: Not Currently    Alcohol/week: 0.0 standard drinks  . Drug use: No  . Sexual activity: Not on file  Other Topics Concern  . Not on file  Social History Narrative   Widowed 1997   Lives alone   Lost a son in his arms in 1978.    3 sons   Social Determinants of Health   Financial Resource Strain: Low Risk   . Difficulty of Paying Living Expenses: Not hard at all  Food Insecurity: No Food Insecurity  . Worried About Charity fundraiser in the Last Year: Never true  .  Ran Out of Food in the Last Year: Never true  Transportation Needs: No Transportation Needs  . Lack of Transportation (Medical): No  . Lack of Transportation (Non-Medical): No  Physical Activity: Not on file  Stress: No Stress Concern Present  . Feeling of Stress : Not at all  Social Connections: Unknown  . Frequency of Communication with Friends and Family: More than three times a week  . Frequency of Social Gatherings with Friends and Family: More than three times a week  . Attends Religious Services: Not on file  . Active Member of Clubs or Organizations: Not on file  . Attends Archivist Meetings:  Not on file  . Marital Status: Widowed  Intimate Partner Violence: Not At Risk  . Fear of Current or Ex-Partner: No  . Emotionally Abused: No  . Physically Abused: No  . Sexually Abused: No    Family History  Problem Relation Age of Onset  . Heart disease Mother   . Lung cancer Mother 84  . Heart attack Paternal Grandmother   . Heart attack Paternal Grandfather   . Liver cancer Neg Hx    Allergies  Allergen Reactions  . Clindamycin Dermatitis   I? Current Outpatient Medications  Medication Sig Dispense Refill  . ALPRAZolam (XANAX) 1 MG tablet Take 1 mg by mouth 3 (three) times daily.    Marland Kitchen amoxicillin-clavulanate (AUGMENTIN) 875-125 MG tablet Take 1 tablet by mouth 2 (two) times daily for 5 days. 14 tablet 0  . Cholecalciferol (VITAMIN D3) 10 MCG (400 UNIT) CAPS Take 4 capsules by mouth daily.    Marland Kitchen doxycycline (VIBRA-TABS) 100 MG tablet Take 1 tablet (100 mg total) by mouth 2 (two) times daily. 10 tablet 0  . folic acid (FOLVITE) 1 MG tablet TAKE 1 TABLET(1 MG) BY MOUTH DAILY 30 tablet 3  . furosemide (LASIX) 40 MG tablet Take 1 tablet (40 mg total) by mouth daily for 5 days. 5 tablet 0  . levothyroxine (SYNTHROID) 50 MCG tablet TAKE 1 TABLET(50 MCG) BY MOUTH DAILY BEFORE BREAKFAST 90 tablet 1  . losartan (COZAAR) 100 MG tablet TAKE 1 TABLET(100 MG) BY MOUTH DAILY 90 tablet 1  . lubiprostone (AMITIZA) 24 MCG capsule Take 24 mcg by mouth daily as needed.    Marland Kitchen morphine (MS CONTIN) 15 MG 12 hr tablet Take 15 mg by mouth 2 (two) times daily as needed.    Marland Kitchen NARCAN 4 MG/0.1ML LIQD nasal spray kit SPRAY 0.1 ML (4 MG) IN 1 NOSTRIL/ MAY REPEAT DOSE EVERY 2 3 MINUTES AS NEEDED ALTERNATING NOSTRILS    . oxycodone (ROXICODONE) 30 MG immediate release tablet Take 1 tablet by mouth 5 (five) times daily as needed.    . pantoprazole (PROTONIX) 40 MG tablet Take 1 tablet (40 mg total) by mouth daily. 30 tablet 0  . propranolol (INDERAL) 40 MG tablet TAKE 1 TABLET(40 MG) BY MOUTH TWICE DAILY 180  tablet 1  . vitamin B-12 (CYANOCOBALAMIN) 1000 MCG tablet Take 1 tablet (1,000 mcg total) by mouth daily.     No current facility-administered medications for this visit.     Abtx:  Anti-infectives (From admission, onward)   None      REVIEW OF SYSTEMS:  Const: negative fever, negative chills, negative weight loss Eyes: negative diplopia or visual changes, negative eye pain ENT: negative coryza, negative sore throat Resp: negative cough, hemoptysis, dyspnea Cards: negative for chest pain, palpitations, has lower extremity edema GU: negative for frequency, dysuria and hematuria GI: Negative for  abdominal pain, diarrhea, bleeding, constipation Skin as above Heme: negative for easy bruising and gum/nose bleeding MS: Generalized weakness, left knee pain Neurolo:negative for headaches, dizziness, vertigo, memory problems  Psych: negative for feelings of anxiety, depression  Endocrine: negative for thyroid, diabetes Allergy/Immunology-clindamycin o Objective:  VITALS:  BP 96/63   Pulse (!) 56   Temp 97.9 F (36.6 C) (Oral)   Resp 16   Ht 5' 10"  (1.778 m)   Wt 210 lb (95.3 kg)   SpO2 99%   BMI 30.13 kg/m  PHYSICAL EXAM:  General: Patient in no distress.  In wheelchair He nods off at times.  But has a full conversation.   Head: Normocephalic, without obvious abnormality, atraumatic. Eyes: Conjunctivae clear, anicteric sclerae. Pupils are equal ENT cannot be examined because of mask  neck: Supple, symmetrical, no adenopathy, thyroid: non tender no carotid bruit and no JVD. Lungs: Bilateral air entry Heart: Regular rate and rhythm, no murmur, rub or gallop. Abdomen: Did not examine as he is in wheelchair.   Extremities: Right AKA Left leg chronic edema, woody induration, scaling skin         skin: Limited examination but no rash.  Neurologic: Grossly non-focal Pertinent Labs Lab Results CBC    Component Value Date/Time   WBC 2.6 (L) 12/28/2020 1241   RBC 2.52  (L) 12/28/2020 1241   HGB 8.3 Repeated and verified X2. (L) 12/28/2020 1241   HGB 12.5 (L) 12/23/2013 1652   HGB 8.7 (L) 02/06/2011 1015   HCT 24.4 Repeated and verified X2. (L) 12/28/2020 1241   HCT 38.0 (L) 12/23/2013 1652   HCT 26.2 (L) 02/06/2011 1015   PLT 36.0 Repeated and verified X2. (LL) 12/28/2020 1241   PLT 50 (L) 12/23/2013 1652   PLT 24 (L) 02/06/2011 1015   MCV 96.7 12/28/2020 1241   MCV 103 (H) 12/23/2013 1652   MCV 104.0 (H) 02/06/2011 1015   MCH 30.7 12/18/2020 0545   MCHC 34.1 12/28/2020 1241   RDW 23.7 (H) 12/28/2020 1241   RDW 18.7 (H) 12/23/2013 1652   RDW 18.5 (H) 02/06/2011 1015   LYMPHSABS 0.6 (L) 12/28/2020 1241   LYMPHSABS 0.7 (L) 12/23/2013 1652   LYMPHSABS 0.7 (L) 02/06/2011 1015   MONOABS 0.2 12/28/2020 1241   MONOABS 0.3 12/23/2013 1652   MONOABS 0.2 02/06/2011 1015   EOSABS 0.1 12/28/2020 1241   EOSABS 0.1 12/23/2013 1652   BASOSABS 0.0 12/28/2020 1241   BASOSABS 0.0 12/23/2013 1652   BASOSABS 0.0 02/06/2011 1015    CMP Latest Ref Rng & Units 12/28/2020 12/21/2020 12/18/2020  Glucose 70 - 99 mg/dL 135(H) 134(H) 132(H)  BUN 6 - 23 mg/dL 16 14 17   Creatinine 0.40 - 1.50 mg/dL 0.75 0.59 0.73  Sodium 135 - 145 mEq/L 140 136 139  Potassium 3.5 - 5.1 mEq/L 4.2 3.9 4.0  Chloride 96 - 112 mEq/L 104 99 105  CO2 19 - 32 mEq/L 25 28 26   Calcium 8.4 - 10.5 mg/dL 9.0 9.0 9.0  Total Protein 6.5 - 8.1 g/dL - - -  Total Bilirubin 0.3 - 1.2 mg/dL - - -  Alkaline Phos 38 - 126 U/L - - -  AST 15 - 41 U/L - - -  ALT 0 - 44 U/L - - -     ? Impression/Recommendation ? 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 Venous eczema Also Neosporin induced allergic skin changes in the leg He is currently on Augmentin There is no obvious evidence of cellulitis at this  moment. He has already completed 5 days of antibiotics and does not need any.  He needs compression wraps especially wound on foot, elevating the leg and avoiding any topical  Neosporin.  Pancytopenia secondary to cirrhosis of the liver. Has splenomegaly and portal hypertension. Hepatitis C.  Was treated with Simeprevir + Sofosbuvir   for 16 weeks between August and NOV 2014 at [facility] and has had viral response.     Chronic pain syndrome on multiple medications including morphine.    Discussed the management in great detail with the patient.  Also informed his PCP.  Will follow him as needed. ? ___ Note:  This document was prepared using Dragon voice recognition software and may include unintentional dictation errors.

## 2020-12-29 NOTE — Patient Instructions (Signed)
You are here referred by your PCP for cellulitis left leg- you have lymphedema/venous edema of the leg and also some allergic reaction to neosporin topical- please avoid neosporin, keep the leg elevated- need compression wraps, unna boot. Contact your PCP- I let her know

## 2020-12-30 NOTE — Assessment & Plan Note (Signed)
Patient well appearing and not septic in appearance. Concerned that he didn't finished augmentin, doxycycline and have refilled for 5 day course. Concern for continued cellulitis versus lymphedema.  Will arrange Infectious disease and vascular consult. Home health referral as I am concerned for patient's safety and medication adherence. Close follow up.

## 2021-01-03 ENCOUNTER — Ambulatory Visit (INDEPENDENT_AMBULATORY_CARE_PROVIDER_SITE_OTHER): Payer: Medicare Other | Admitting: Family

## 2021-01-03 ENCOUNTER — Encounter: Payer: Self-pay | Admitting: Family

## 2021-01-03 ENCOUNTER — Other Ambulatory Visit: Payer: Self-pay

## 2021-01-03 ENCOUNTER — Telehealth: Payer: Self-pay | Admitting: Family

## 2021-01-03 DIAGNOSIS — M7989 Other specified soft tissue disorders: Secondary | ICD-10-CM

## 2021-01-03 NOTE — Telephone Encounter (Signed)
Would you like me to have patient come back in? He has seemed like he has had some issues with repeating or forgetting last two visits with [place]. Please advise?

## 2021-01-03 NOTE — Telephone Encounter (Signed)
Noted and discussed with patient today

## 2021-01-03 NOTE — Telephone Encounter (Signed)
Call pt  How is he?  Is he confused? Any fever, dysuria? Acute confusion would need urgent evaluation in ED.  Please ask if he would allow Home care RN to evaluate this leg ; she needs to remove bandage to assess for wound and reapply ace wrap.     he was supposed to come in today. Is he coming?  Please ensure he is aware that I placed vascular referral after consulting with infectious disease last week regarding left leg  Infectious disease Dr Vern Claude didn't feel he had infection. She was more concerned that swelling in left leg was related to vascular condition such as lymphedema which is why I placed referral  Call  Home health We are reaching out to pt as well  Ace wrap for left leg swelling and help pt to elevate leg  If he is acutely confused or refusing to let RN evaluate leg, advise pt he could get another infection.

## 2021-01-03 NOTE — Patient Instructions (Signed)
Please continue to follow with home health and would let them come tomorrow for assessment and change the ace wrap in another 2 days - they should come 3 times per week  Referral to vascular  Let us know if you dont hear back within a week in regards to an appointment being scheduled.

## 2021-01-03 NOTE — Progress Notes (Signed)
Subjective:    Patient ID: Derek Rough., male    DOB: [birth year], 70 y.o.   MRN: [id number]  CC: Derek Kamel Howse [name]. is a 70 y.o. male who presents today for follow up.   HPI: Left left swelling and redness is unchanged. No leg pain when standing.   No fever. Clear scant drainage.   Home health is scheduled to come tomorrow but he feels like he doesn't need them to come  No fever, sob  Seeing hematology tomorrow.   He has completed doxycycline, augmentin, lasix.        HISTORY:  Past Medical History:  Diagnosis Date  . 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 ANXIETY 03/24/2007  . Chronic pain   . CIRRHOSIS 03/24/2007  . 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 FRACTURE, WRIST, LEFT 10/12/2009   Qualifier: Diagnosis of  By: Loanne Drilling MD, Jacelyn Pi   . HEPATITIS C 03/24/2007  . HYPERLIPIDEMIA 09/12/2009  . Hypocalcemia 12/23/2007   Qualifier: Diagnosis of  By: Loanne Drilling MD, Jacelyn Pi   . Pancytopenia 12/23/2007  . 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 Squamous cell carcinoma of skin 05/21/2018   R mid volar forearm  . Thyroid disease   . UROLITHIASIS, HX OF 12/23/2007   Past Surgical History:  Procedure Laterality Date  . ABOVE KNEE LEG AMPUTATION Right    Right femur fracture, 11/1994, resulted in right AKA 2006 after staph infection  . COLONOSCOPY N/A 12/16/2020   Procedure: COLONOSCOPY;  Surgeon: Virgel Manifold, MD;  Location: [facility] ENDOSCOPY;  Service: Endoscopy;  Laterality: N/A;  . ESOPHAGOGASTRODUODENOSCOPY N/A 12/16/2020   Procedure: ESOPHAGOGASTRODUODENOSCOPY (EGD);  Surgeon: Virgel Manifold, MD;  Location: [facility] ENDOSCOPY;  Service: Endoscopy;  Laterality: N/A;   Family History  Problem Relation Age of Onset  . Heart disease Mother   . Lung cancer Mother 7  . Heart attack Paternal Grandmother   . Heart attack Paternal Grandfather   . Liver cancer Neg Hx     Allergies: Clindamycin Current Outpatient  Medications on File Prior to Visit  Medication Sig Dispense Refill  . ALPRAZolam (XANAX) 1 MG tablet Take 1 mg by mouth 3 (three) times daily.    . Cholecalciferol (VITAMIN D3) 10 MCG (400 UNIT) CAPS Take 4 capsules by mouth daily.    . folic acid (FOLVITE) 1 MG tablet TAKE 1 TABLET(1 MG) BY MOUTH DAILY 30 tablet 3  . levothyroxine (SYNTHROID) 50 MCG tablet TAKE 1 TABLET(50 MCG) BY MOUTH DAILY BEFORE BREAKFAST 90 tablet 1  . losartan (COZAAR) 100 MG tablet TAKE 1 TABLET(100 MG) BY MOUTH DAILY 90 tablet 1  . lubiprostone (AMITIZA) 24 MCG capsule Take 24 mcg by mouth daily as needed.    Marland Kitchen morphine (MS CONTIN) 15 MG 12 hr tablet Take 15 mg by mouth 2 (two) times daily as needed.    Marland Kitchen NARCAN 4 MG/0.1ML LIQD nasal spray kit SPRAY 0.1 ML (4 MG) IN 1 NOSTRIL/ MAY REPEAT DOSE EVERY 2 3 MINUTES AS NEEDED ALTERNATING NOSTRILS    . oxycodone (ROXICODONE) 30 MG immediate release tablet Take 1 tablet by mouth 5 (five) times daily as needed.    . pantoprazole (PROTONIX) 40 MG tablet Take 1 tablet (40 mg total) by mouth daily. 30 tablet 0  . propranolol (INDERAL) 40 MG tablet TAKE 1 TABLET(40 MG) BY MOUTH TWICE DAILY 180 tablet 1  . vitamin B-12 (CYANOCOBALAMIN) 1000 MCG tablet Take  1 tablet (1,000 mcg total) by mouth daily.     No current facility-administered medications on file prior to visit.    Social History   Tobacco Use  . Smoking status: Never Smoker  . Smokeless tobacco: Never Used  Substance Use Topics  . Alcohol use: Not Currently    Alcohol/week: 0.0 standard drinks  . Drug use: No    Review of Systems  Constitutional: Negative for chills and fever.  Respiratory: Negative for cough and shortness of breath.   Cardiovascular: Positive for leg swelling. Negative for chest pain and palpitations.  Gastrointestinal: Negative for nausea and vomiting.  Skin: Positive for wound. Negative for rash.      Objective:    BP 122/60 (BP Location: Left Arm, Patient Position: Sitting, Cuff Size:  Large)   Pulse 61   Temp 98.5 F (36.9 C) (Oral)   Ht 5' 10"  (1.778 m)   Wt 210 lb (95.3 kg)   SpO2 96%   BMI 30.13 kg/m  BP Readings from Last 3 Encounters:  01/03/21 122/60  12/29/20 96/63  12/28/20 124/62   Wt Readings from Last 3 Encounters:  01/03/21 210 lb (95.3 kg)  12/29/20 210 lb (95.3 kg)  12/21/20 210 lb (95.3 kg)    Physical Exam Vitals reviewed.  Constitutional:      Appearance: He is well-developed.  Cardiovascular:     Rate and Rhythm: Regular rhythm.     Heart sounds: Normal heart sounds.     Comments: Right AKA.   RLE non pitting +2 edema. Skin is very taught, thickening of skin. Nontender to palpation. Scant drops of clear fluid noted when ACE wrap removed. No purulent discharge. No palpable cords or masses. Distal  Erythema dorsal aspect of foot.  No increased warmth. LE warm.   Pulmonary:     Effort: Pulmonary effort is normal. No respiratory distress.     Breath sounds: Normal breath sounds. No wheezing, rhonchi or rales.  Skin:    General: Skin is warm and dry.  Neurological:     Mental Status: He is alert.  Psychiatric:        Speech: Speech normal.        Behavior: Behavior normal.        Assessment & Plan:   Problem List Items Addressed This Visit      Other   Left leg swelling    Presentation is largely unchanged. Patient is well appearing. Reassured by exam and note from infectious disease. No new symptoms to raise concern for cellulitis today.  Vascular consult in place to evaluate for lymphedema, PVD. Advised patient to continue to with  Home health, 3 times per week.           I have discontinued Derek Lose C. Harting Jr.'s doxycycline and furosemide. I am also having him maintain his ALPRAZolam, oxycodone, vitamin B-12, lubiprostone, Narcan, Vitamin D3, morphine, folic acid, losartan, propranolol, pantoprazole, and levothyroxine.   No orders of the defined types were placed in this encounter.   Return precautions given.    Risks, benefits, and alternatives of the medications and treatment plan prescribed today were discussed, and patient expressed understanding.   Education regarding symptom management and diagnosis given to patient on AVS.  Continue to follow with [name], FNP for routine health maintenance.   Derek Anon Richner Jr. and I agreed with plan.   Mable Paris, FNP

## 2021-01-03 NOTE — Telephone Encounter (Signed)
[name] from [facility] Nurse called to advise that the PT seems scatterbrain. She also would like to get some clarification on the instructions she has from the Physicians that states to clean and wrap a ace bandage wrap 3 times weekly. She also states that the PT stated that the Celitis seems to be causing some openings and would not allow her to take a look at it.

## 2021-01-03 NOTE — Assessment & Plan Note (Addendum)
Presentation is largely unchanged. Patient is well appearing. Reassured by exam and note from infectious disease. No new symptoms to raise concern for cellulitis today.  Vascular consult in place to evaluate for lymphedema, PVD. Advised patient to continue to with  Home health, 3 times per week.

## 2021-01-03 NOTE — Telephone Encounter (Signed)
Patient in office today with no confusion, dysuria. He just was unsure of [facility] RN that came in. He was unsure of her qualifications & he also liked the way his leg was dressed at infectious disease. It was a compression wrap & he is unsure if he should continue compression. He has his own regular wrap on today & has just wrapped his foot. He said that nurse that came to his house also came with no supplies & he said he guessed he would have get his own? He said that he was actually fine wrapped his own leg or having his CNA, Brenda wrap his leg.

## 2021-01-04 ENCOUNTER — Inpatient Hospital Stay: Payer: Medicare Other | Admitting: Oncology

## 2021-01-04 NOTE — Progress Notes (Deleted)
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 He has seen Dr. Allen Norris for cirrhosis in the past. He has not had any recent episodes of ascites. 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 With regards to his CBC patient has had baseline thrombocytopenia and his platelet counts mainly fluctuate between 40-60 at least since 2015. He also has chronic leukopenia with a white count that fluctuates between 2.3-3.5 at least dating back to 2015. Differential has mainly showed lymphopenia but at times neutropenia. Hemoglobin has been mostly between 9.5-10.5 most recently patient was found to have a white count of 3.5, H&H of 9.7/28.8 and a platelet count of 65 and has been referred to [place] for pancytopenia. In terms of his CMP he has had elevated bilirubin which fluctuates between 1.6-2.9 in the past but was recently noted to have a bilirubin of 3.1 mostly indirect hyperbilirubinemia with normal AST and ALT  Patient uses a motorized wheelchair for ambulation but is  independent of his ADLs and IADLs and isable to drive as well. Appetite and weight have been stable. 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 Currently patient denies any bleeding in his stool or urine. Denies any dark tarry stools  Results of blood work from 12/21/2019 were as follows: CBC showed white count of 6.2, H&H of 10.3/30.2 with an MCV of 96 and a platelet count of 99. Total bilirubin was elevated at 3.1 with predominantly indirect hyperbilirubinemia. Folic acid was normal at 6.6. B12 levels were elevated at 1441. Ferritin levels were elevated at 540 iron studies were normal. Haptoglobin was less than 10. Reticulocyte count was elevated at 10.6. Myeloma panel showed no M protein. LDH was mildly elevated at 256. Coombs test was negative. AFP was normal at 4.3.  Interval History- He was last seen in the Oncology Clinic by Dr. Janese Banks on 04/08/2020. He presents to the clinic today for a hospital follow up.   He was recently hospitalized from 12/14/2020 until 12/18/2020 for left lower leg pain, initially treated with rocephin, and diagnosed with LLE cellulitis.  -ABX course changed to doxy and Unasyn on 05/20.  -Saw PCP on 12/21/2020, patient to f/u with PCP in 1 week.  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 - Three 5 to 10 mm, non-bleeding polyps in the descending colon, in the transverse colon and in the ascending colon. - Diverticulosis in the sigmoid colon. - Non-bleeding internal hemorrhoids. - No specimens collected. Impression: - Return patient to hospital ward for ongoing care. - Consider hematology referral and anemia workup for this patient with chronic  anemia, and already sees hematology as an outpatient - Return to GI clinic in 4 weeks. - If further anemia workup shows iron  deficiency capsule study can be considered as an outpatient - - Repeat colonoscopy within 3 months for removal of polyps seen (after appropriate steps to improve platelet count pre-procedure). - Low sodium diet. - The findings and recommendations were discussed with the patient.  EGD (12/16/2020)  Discolored mucosa in the esophagus. Biopsied. - A single mucosal papule (nodule) found in the stomach. Biopsied. - Gastric varices, without bleeding. - Erythematous duodenopathy. Biopsied. Impression: - Await pathology results. - Return patient to hospital ward for ongoing care. - Continue present medications. - The findings and recommendations were discussed with the patient.   ECOG PS- 2 Pain scale- 0   Review of Systems  Constitutional: Positive for malaise/fatigue. Negative for fever and weight loss.       Chronic fatigue.  HENT: Negative for congestion and hearing loss.   Eyes: Negative for blurred vision and double vision.  Respiratory: Negative for cough.   Cardiovascular: Negative for chest pain and palpitations.  Gastrointestinal: Negative for abdominal pain, constipation, diarrhea, nausea and vomiting.  Genitourinary: Negative for frequency and urgency.  Skin: Negative for rash.  Neurological: Negative for dizziness, tingling and headaches.  Endo/Heme/Allergies: Does not bruise/bleed easily.  Psychiatric/Behavioral: Negative for depression. The patient is not nervous/anxious and does not have insomnia.       Allergies  Allergen Reactions  . Clindamycin Dermatitis     Past Medical History:  Diagnosis Date  . 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 ANXIETY 03/24/2007  . Chronic pain   . CIRRHOSIS 03/24/2007  . 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 FRACTURE, WRIST, LEFT 10/12/2009   Qualifier: Diagnosis of  By: Loanne Drilling MD, Jacelyn Pi   . HEPATITIS C 03/24/2007  . HYPERLIPIDEMIA 09/12/2009  . Hypocalcemia 12/23/2007    Qualifier: Diagnosis of  By: Loanne Drilling MD, Jacelyn Pi   . Pancytopenia 12/23/2007  . 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 Squamous cell carcinoma of skin 05/21/2018   R mid volar forearm  . Thyroid disease   . UROLITHIASIS, HX OF 12/23/2007     Past Surgical History:  Procedure Laterality Date  . ABOVE KNEE LEG AMPUTATION Right    Right femur fracture, 11/1994, resulted in right AKA 2006 after staph infection  . COLONOSCOPY N/A 12/16/2020   Procedure: COLONOSCOPY;  Surgeon: Virgel Manifold, MD;  Location: [facility] ENDOSCOPY;  Service: Endoscopy;  Laterality: N/A;  . ESOPHAGOGASTRODUODENOSCOPY N/A 12/16/2020   Procedure: ESOPHAGOGASTRODUODENOSCOPY (EGD);  Surgeon: Virgel Manifold, MD;  Location: [facility] ENDOSCOPY;  Service: Endoscopy;  Laterality: N/A;    Social History   Socioeconomic History  . Marital status: Widowed    Spouse name: Not on file  . Number of children: Not on file  . Years of education: Not on file  . Highest education level: Not on file  Occupational History  . Occupation: Disabled  Tobacco Use  . Smoking status: Never Smoker  . Smokeless tobacco: Never Used  Substance and Sexual Activity  . Alcohol use: Not Currently    Alcohol/week: 0.0 standard drinks  . Drug use: No  . Sexual activity: Not on file  Other Topics Concern  . Not on file  Social History Narrative   Widowed 1997   Lives alone   Lost a son in his arms in 1978.    3 sons  Social Determinants of Health   Financial Resource Strain: Low Risk   . Difficulty of Paying Living Expenses: Not hard at all  Food Insecurity: No Food Insecurity  . Worried About Charity fundraiser in the Last Year: Never true  . Ran Out of Food in the Last Year: Never true  Transportation Needs: No Transportation Needs  . Lack of Transportation (Medical): No  . Lack of Transportation (Non-Medical): No  Physical Activity: Not on file  Stress: No Stress Concern Present  . Feeling of Stress : Not at all  Social  Connections: Unknown  . Frequency of Communication with Friends and Family: More than three times a week  . Frequency of Social Gatherings with Friends and Family: More than three times a week  . Attends Religious Services: Not on file  . Active Member of Clubs or Organizations: Not on file  . Attends Archivist Meetings: Not on file  . Marital Status: Widowed  Intimate Partner Violence: Not At Risk  . Fear of Current or Ex-Partner: No  . Emotionally Abused: No  . Physically Abused: No  . Sexually Abused: No    Family History  Problem Relation Age of Onset  . Heart disease Mother   . Lung cancer Mother 62  . Heart attack Paternal Grandmother   . Heart attack Paternal Grandfather   . Liver cancer Neg Hx      Current Outpatient Medications:  .  ALPRAZolam (XANAX) 1 MG tablet, Take 1 mg by mouth 3 (three) times daily., Disp: , Rfl:  .  Cholecalciferol (VITAMIN D3) 10 MCG (400 UNIT) CAPS, Take 4 capsules by mouth daily., Disp: , Rfl:  .  folic acid (FOLVITE) 1 MG tablet, TAKE 1 TABLET(1 MG) BY MOUTH DAILY, Disp: 30 tablet, Rfl: 3 .  levothyroxine (SYNTHROID) 50 MCG tablet, TAKE 1 TABLET(50 MCG) BY MOUTH DAILY BEFORE BREAKFAST, Disp: 90 tablet, Rfl: 1 .  losartan (COZAAR) 100 MG tablet, TAKE 1 TABLET(100 MG) BY MOUTH DAILY, Disp: 90 tablet, Rfl: 1 .  lubiprostone (AMITIZA) 24 MCG capsule, Take 24 mcg by mouth daily as needed., Disp: , Rfl:  .  morphine (MS CONTIN) 15 MG 12 hr tablet, Take 15 mg by mouth 2 (two) times daily as needed., Disp: , Rfl:  .  NARCAN 4 MG/0.1ML LIQD nasal spray kit, SPRAY 0.1 ML (4 MG) IN 1 NOSTRIL/ MAY REPEAT DOSE EVERY 2 3 MINUTES AS NEEDED ALTERNATING NOSTRILS, Disp: , Rfl:  .  oxycodone (ROXICODONE) 30 MG immediate release tablet, Take 1 tablet by mouth 5 (five) times daily as needed., Disp: , Rfl:  .  pantoprazole (PROTONIX) 40 MG tablet, Take 1 tablet (40 mg total) by mouth daily., Disp: 30 tablet, Rfl: 0 .  propranolol (INDERAL) 40 MG tablet,  TAKE 1 TABLET(40 MG) BY MOUTH TWICE DAILY, Disp: 180 tablet, Rfl: 1 .  vitamin B-12 (CYANOCOBALAMIN) 1000 MCG tablet, Take 1 tablet (1,000 mcg total) by mouth daily., Disp: , Rfl:   Physical exam:  There were no vitals filed for this visit.   Physical Exam Vitals and nursing note reviewed.  HENT:     Head: Normocephalic and atraumatic.     Nose: No congestion.     Mouth/Throat:     Mouth: Mucous membranes are moist.     Pharynx: Oropharynx is clear.  Eyes:     General:        Right eye: No discharge.        Left eye: No discharge.  Extraocular Movements: Extraocular movements intact.     Pupils: Pupils are equal, round, and reactive to light.  Cardiovascular:     Rate and Rhythm: Normal rate and regular rhythm.     Pulses: Normal pulses.     Heart sounds: No murmur heard.   Pulmonary:     Effort: Pulmonary effort is normal.  Abdominal:     General: Bowel sounds are normal. There is no distension.     Tenderness: There is no abdominal tenderness. There is no guarding.  Musculoskeletal:        General: No swelling or deformity.     Right lower leg: No edema.     Left lower leg: No edema.     Right Lower Extremity: Right leg is amputated above knee.  Skin:    General: Skin is warm and dry.     Capillary Refill: Capillary refill takes less than 2 seconds.  Neurological:     Mental Status: He is alert and oriented to person, place, and time.  Psychiatric:        Mood and Affect: Mood normal.        Thought Content: Thought content normal.       CMP Latest Ref Rng & Units 12/28/2020  Glucose 70 - 99 mg/dL 135(H)  BUN 6 - 23 mg/dL 16  Creatinine 0.40 - 1.50 mg/dL 0.75  Sodium 135 - 145 mEq/L 140  Potassium 3.5 - 5.1 mEq/L 4.2  Chloride 96 - 112 mEq/L 104  CO2 19 - 32 mEq/L 25  Calcium 8.4 - 10.5 mg/dL 9.0  Total Protein 6.5 - 8.1 g/dL -  Total Bilirubin 0.3 - 1.2 mg/dL -  Alkaline Phos 38 - 126 U/L -  AST 15 - 41 U/L -  ALT 0 - 44 U/L -   CBC Latest Ref Rng &  Units 12/28/2020  WBC 4.0 - 10.5 K/uL 2.6(L)  Hemoglobin 13.0 - 17.0 g/dL 8.3 Repeated and verified X2.(L)  Hematocrit 39.0 - 52.0 % 24.4 Repeated and verified X2.(L)  Platelets 150.0 - 400.0 K/uL 36.0 Repeated and verified X2.(LL)     Assessment and Plan- Patient is a 70 y.o. 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  Normocytic anemia: This waxes and wanes and was down to 8.7 in July 2021, 11.06 April 2020, and *** today.  Coombs test again was negative.  PNH profile negative.  Haptoglobin remains less than 10 and reticulocyte count elevated at 11.7 in August 2021, but down to 11.7 in September 2021. No indication for bone marrow biopsy at this time.  He would also not benefit from steroids or Rituxan as this is nonimmune hemolysis.  He will continue folic acid supplements  2.  Thrombocytopenia Secondary to cirrhosis and splenomegaly.  PLT today is *** Platelet counts have remained stable between 50s to 60s and again waxes and wanes.  3.  Leukopenia:  Mild likely secondary to splenic sequestration.   WBC today is *** Continue to monitor  4. [place] surveillance:  -Baseline AFP was normal -ABD [place] ordered. Scheduled for 01/25/2021.   Visit Diagnosis 1. Normocytic anemia   2. Other non-autoimmune hemolytic anemias ([place])   3. Other cirrhosis of liver (HCC)    Disposition:  RTC in 6 months for MD assessment & labs. CBC with diff, CMP, Retic, and Heptoglobin.     The patient's diagnosis, an outline of the further diagnostic and laboratory studies which will  be required, the recommendation for surgery, and alternatives were discussed with her and her accompanying family members.  All questions were answered to their satisfaction.  I personally had a face to face interaction and evaluated the patient jointly with the NP Student, Mrs. Benedetto Goad.  I have reviewed her history and  available records and have performed the key portions of the physical exam including general, HEENT, abdominal exam, pelvic exam with my findings confirming those documented above by the APP student.  I have discussed the case with the APP student and the patient.  I agree with the above documentation, assessment and plan which was fully formulated by me.  Counseling was completed by me.    Benedetto Goad, Student FNP

## 2021-01-06 ENCOUNTER — Inpatient Hospital Stay: Payer: Medicare Other | Attending: Oncology | Admitting: Nurse Practitioner

## 2021-01-06 DIAGNOSIS — D72819 Decreased white blood cell count, unspecified: Secondary | ICD-10-CM | POA: Insufficient documentation

## 2021-01-06 DIAGNOSIS — M7989 Other specified soft tissue disorders: Secondary | ICD-10-CM | POA: Insufficient documentation

## 2021-01-06 DIAGNOSIS — D649 Anemia, unspecified: Secondary | ICD-10-CM | POA: Insufficient documentation

## 2021-01-06 DIAGNOSIS — Z993 Dependence on wheelchair: Secondary | ICD-10-CM | POA: Insufficient documentation

## 2021-01-06 DIAGNOSIS — D6959 Other secondary thrombocytopenia: Secondary | ICD-10-CM | POA: Insufficient documentation

## 2021-01-10 ENCOUNTER — Ambulatory Visit (INDEPENDENT_AMBULATORY_CARE_PROVIDER_SITE_OTHER): Payer: Medicare Other | Admitting: Nurse Practitioner

## 2021-01-10 ENCOUNTER — Other Ambulatory Visit: Payer: Self-pay

## 2021-01-10 VITALS — BP 109/62 | HR 68 | Ht 72.0 in | Wt 210.0 lb

## 2021-01-10 DIAGNOSIS — I10 Essential (primary) hypertension: Secondary | ICD-10-CM | POA: Diagnosis not present

## 2021-01-10 DIAGNOSIS — E785 Hyperlipidemia, unspecified: Secondary | ICD-10-CM

## 2021-01-10 DIAGNOSIS — R600 Localized edema: Secondary | ICD-10-CM

## 2021-01-10 DIAGNOSIS — M7989 Other specified soft tissue disorders: Secondary | ICD-10-CM

## 2021-01-11 ENCOUNTER — Inpatient Hospital Stay: Payer: Medicare Other

## 2021-01-11 ENCOUNTER — Inpatient Hospital Stay (HOSPITAL_BASED_OUTPATIENT_CLINIC_OR_DEPARTMENT_OTHER): Payer: Medicare Other | Admitting: Oncology

## 2021-01-11 ENCOUNTER — Encounter: Payer: Self-pay | Admitting: Oncology

## 2021-01-11 ENCOUNTER — Other Ambulatory Visit: Payer: Self-pay

## 2021-01-11 VITALS — BP 152/70 | HR 60 | Temp 98.7°F | Resp 16 | Wt 210.0 lb

## 2021-01-11 DIAGNOSIS — D696 Thrombocytopenia, unspecified: Secondary | ICD-10-CM

## 2021-01-11 DIAGNOSIS — D649 Anemia, unspecified: Secondary | ICD-10-CM | POA: Diagnosis present

## 2021-01-11 DIAGNOSIS — D6959 Other secondary thrombocytopenia: Secondary | ICD-10-CM | POA: Diagnosis not present

## 2021-01-11 DIAGNOSIS — D72819 Decreased white blood cell count, unspecified: Secondary | ICD-10-CM | POA: Diagnosis not present

## 2021-01-11 DIAGNOSIS — Z993 Dependence on wheelchair: Secondary | ICD-10-CM | POA: Diagnosis not present

## 2021-01-11 DIAGNOSIS — M7989 Other specified soft tissue disorders: Secondary | ICD-10-CM | POA: Diagnosis not present

## 2021-01-11 LAB — CBC WITH DIFFERENTIAL/PLATELET
Abs Immature Granulocytes: 0.02 10*3/uL (ref 0.00–0.07)
Basophils Absolute: 0 10*3/uL (ref 0.0–0.1)
Basophils Relative: 0 %
Eosinophils Absolute: 0.1 10*3/uL (ref 0.0–0.5)
Eosinophils Relative: 3 %
HCT: 26.5 % — ABNORMAL LOW (ref 39.0–52.0)
Hemoglobin: 9.1 g/dL — ABNORMAL LOW (ref 13.0–17.0)
Immature Granulocytes: 1 %
Lymphocytes Relative: 31 %
Lymphs Abs: 0.8 10*3/uL (ref 0.7–4.0)
MCH: 33.1 pg (ref 26.0–34.0)
MCHC: 34.3 g/dL (ref 30.0–36.0)
MCV: 96.4 fL (ref 80.0–100.0)
Monocytes Absolute: 0.2 10*3/uL (ref 0.1–1.0)
Monocytes Relative: 8 %
Neutro Abs: 1.5 10*3/uL — ABNORMAL LOW (ref 1.7–7.7)
Neutrophils Relative %: 57 %
Platelets: 44 10*3/uL — ABNORMAL LOW (ref 150–400)
RBC: 2.75 MIL/uL — ABNORMAL LOW (ref 4.22–5.81)
RDW: 20.7 % — ABNORMAL HIGH (ref 11.5–15.5)
WBC: 2.7 10*3/uL — ABNORMAL LOW (ref 4.0–10.5)
nRBC: 0 % (ref 0.0–0.2)

## 2021-01-11 LAB — COMPREHENSIVE METABOLIC PANEL
ALT: 19 U/L (ref 0–44)
AST: 25 U/L (ref 15–41)
Albumin: 4.2 g/dL (ref 3.5–5.0)
Alkaline Phosphatase: 61 U/L (ref 38–126)
Anion gap: 9 (ref 5–15)
BUN: 18 mg/dL (ref 8–23)
CO2: 26 mmol/L (ref 22–32)
Calcium: 9.3 mg/dL (ref 8.9–10.3)
Chloride: 100 mmol/L (ref 98–111)
Creatinine, Ser: 0.68 mg/dL (ref 0.61–1.24)
GFR, Estimated: >60 mL/min (ref >60)
Glucose, Bld: 132 mg/dL — ABNORMAL HIGH (ref 70–99)
Potassium: 4 mmol/L (ref 3.5–5.1)
Sodium: 135 mmol/L (ref 135–145)
Total Bilirubin: 2.1 mg/dL — ABNORMAL HIGH (ref 0.3–1.2)
Total Protein: 7.9 g/dL (ref 6.5–8.1)

## 2021-01-11 NOTE — Progress Notes (Signed)
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  He has seen Dr. Allen Norris for cirrhosis in the past.  He has not had any recent episodes of ascites.  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  With regards to his CBC patient has had baseline thrombocytopenia and his platelet counts mainly fluctuate between 40-60 at least since 2015.  He also has chronic leukopenia with a white count that fluctuates between 2.3-3.5 at least dating back to 2015.  Differential has mainly showed lymphopenia but at times neutropenia.  Hemoglobin has been mostly between 9.5-10.5 most recently patient was found to have a white count of 3.5, H&H of 9.7/28.8 and a platelet count of 65 and has been referred to [place] for pancytopenia.  In terms of his CMP he has had elevated bilirubin which fluctuates between 1.6-2.9 in the past but was recently noted to have a bilirubin of 3.1 mostly indirect hyperbilirubinemia with normal AST and ALT   Patient uses a motorized wheelchair for ambulation but is independent of  his ADLs and IADLs and is able to drive as well.  Appetite and weight have been stable.  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  Currently patient denies any bleeding in his stool or urine.  Denies any dark tarry stools   Results of blood work from 12/21/2019 were as follows: CBC showed white count of 6.2, H&H of 10.3/30.2 with an MCV of 96 and a platelet count of 99.  Total bilirubin was elevated at 3.1 with predominantly indirect hyperbilirubinemia.  Folic acid was normal at 6.6.  B12 levels were elevated at 1441.  Ferritin levels were elevated at 540 iron studies were normal.  Haptoglobin was less than 10.  Reticulocyte count was elevated at 10.6.  Myeloma panel showed no M protein.  LDH was mildly elevated at 256.  Coombs test was negative.  AFP was normal at 4.3.   Interval History- He was last seen in the Oncology Clinic by Dr. Janese Banks on 04/08/2020. He presents to the clinic today for a hospital follow up.   He was hospitalized from 12/14/2020-12/18/2020 for left lower leg pain. The leg was found to be swollen and erythematous and he was started on IV antibiotics for possible cellulitis with ceftriaxone.  He was also found to have acute on chronic anemia and thrombocytopenia.  He received 2 units of PRBC.  On 12/16/2020 he underwent endoscopy that showed gastric varices with no bleeding but also several discolored areas in the esophagus and stomach. Colonoscopy showed multiple polyps. He was asked to follow-up as outpatient with GI and hematology.  Today, he reports feeling well.. Admits to stable chronic  fatigue. Appetite overall stable. He performs all his own ADL's. He is in a wheelchair. Reports a rash secondary to the use of neosporin. Comepleted Augmenttin last week. Uses morphine for chronic pain management.   Met with Eulogio Ditch, NP yesterday for f/u for cellulitis and swelling of left leg likely  secondary to venous insufficieny. Recommended he continues UNA boots with weekly evaluation. He also needed u/s of extremity to rule out blood clot. Imaging was negative.  Met with infectious disease Dr. Delaine Lame on 12/29/20 who did not recommend additional antibiotics.  Has abdominal ultrasound scheduled for 01/25/21.   ECOG PS- 2 Pain scale- 0   Review of Systems  Constitutional:  Positive for malaise/fatigue. Negative for fever and weight loss.       Chronic fatigue.  HENT:  Negative for congestion, hearing loss and nosebleeds.   Eyes:  Negative for blurred vision and double vision.  Respiratory:  Negative for cough and hemoptysis.   Cardiovascular:  Negative for chest pain and palpitations.  Gastrointestinal:  Negative for abdominal pain, blood in stool, constipation, diarrhea, melena, nausea and vomiting.  Genitourinary:  Negative for frequency and urgency.  Skin:  Negative for rash.  Neurological:  Negative for dizziness, tingling and headaches.  Endo/Heme/Allergies:  Does not bruise/bleed easily.  Psychiatric/Behavioral:  Negative for depression. The patient is not nervous/anxious and does not have insomnia.      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   Squamous cell carcinoma of skin 05/21/2018   R mid volar forearm   Thyroid disease    UROLITHIASIS, HX OF 12/23/2007     Past Surgical History:  Procedure Laterality Date   ABOVE KNEE LEG AMPUTATION Right     Right femur fracture, 11/1994, resulted in right AKA 2006 after staph infection   COLONOSCOPY N/A 12/16/2020   Procedure: COLONOSCOPY;  Surgeon: Virgel Manifold, MD;  Location: [facility] ENDOSCOPY;  Service: Endoscopy;  Laterality: N/A;   ESOPHAGOGASTRODUODENOSCOPY N/A 12/16/2020   Procedure: ESOPHAGOGASTRODUODENOSCOPY (EGD);  Surgeon: Virgel Manifold, MD;  Location: [facility] ENDOSCOPY;  Service: Endoscopy;  Laterality: N/A;    Social History   Socioeconomic History   Marital status: Widowed    Spouse name: Not on file   Number of children: Not on file   Years of education: Not on file   Highest education level: Not on file  Occupational History   Occupation: Disabled  Tobacco Use   Smoking status: Never   Smokeless tobacco: Never  Substance and Sexual Activity   Alcohol use: Not Currently    Alcohol/week: 0.0 standard drinks   Drug use: No   Sexual activity: Not on file  Other Topics Concern   Not on file  Social History Narrative   Widowed 1997   Lives alone   Lost a son in his arms in 41.    3 sons   Social Determinants of Radio broadcast assistant  Strain: Low Risk    Difficulty of Paying Living Expenses: Not hard at all  Food Insecurity: No Food Insecurity   Worried About Charity fundraiser in the Last Year: Never true   Ran Out of Food in the Last Year: Never true  Transportation Needs: No Transportation Needs   Lack of Transportation (Medical): No   Lack of Transportation (Non-Medical): No  Physical Activity: Not on file  Stress: No Stress Concern Present   Feeling of Stress : Not at all  Social Connections: Unknown   Frequency of Communication with Friends and Family: More than three times a week   Frequency of Social Gatherings with Friends and Family: More than three times a week   Attends Religious Services: Not on file   Active Member of Clubs or Organizations: Not on file   Attends Archivist Meetings: Not on file   Marital Status:  Widowed  Human resources officer Violence: Not At Risk   Fear of Current or Ex-Partner: No   Emotionally Abused: No   Physically Abused: No   Sexually Abused: No    Family History  Problem Relation Age of Onset   Heart disease Mother    Lung cancer Mother 16   Heart attack Paternal Grandmother    Heart attack Paternal Grandfather    Liver cancer Neg Hx      Current Outpatient Medications:    ALPRAZolam (XANAX) 1 MG tablet, Take 1 mg by mouth 3 (three) times daily., Disp: , Rfl:    Cholecalciferol (VITAMIN D3) 10 MCG (400 UNIT) CAPS, Take 4 capsules by mouth daily., Disp: , Rfl:    folic acid (FOLVITE) 1 MG tablet, TAKE 1 TABLET(1 MG) BY MOUTH DAILY, Disp: 30 tablet, Rfl: 3   levothyroxine (SYNTHROID) 50 MCG tablet, TAKE 1 TABLET(50 MCG) BY MOUTH DAILY BEFORE BREAKFAST, Disp: 90 tablet, Rfl: 1   losartan (COZAAR) 100 MG tablet, TAKE 1 TABLET(100 MG) BY MOUTH DAILY, Disp: 90 tablet, Rfl: 1   lubiprostone (AMITIZA) 24 MCG capsule, Take 24 mcg by mouth daily as needed., Disp: , Rfl:    morphine (MS CONTIN) 15 MG 12 hr tablet, Take 15 mg by mouth 2 (two) times daily as needed., Disp: , Rfl:    NARCAN 4 MG/0.1ML LIQD nasal spray kit, SPRAY 0.1 ML (4 MG) IN 1 NOSTRIL/ MAY REPEAT DOSE EVERY 2 3 MINUTES AS NEEDED ALTERNATING NOSTRILS, Disp: , Rfl:    oxycodone (ROXICODONE) 30 MG immediate release tablet, Take 1 tablet by mouth 5 (five) times daily as needed., Disp: , Rfl:    pantoprazole (PROTONIX) 40 MG tablet, Take 1 tablet (40 mg total) by mouth daily., Disp: 30 tablet, Rfl: 0   propranolol (INDERAL) 40 MG tablet, TAKE 1 TABLET(40 MG) BY MOUTH TWICE DAILY, Disp: 180 tablet, Rfl: 1   vitamin B-12 (CYANOCOBALAMIN) 1000 MCG tablet, Take 1 tablet (1,000 mcg total) by mouth daily., Disp: , Rfl:   Physical exam:  Vitals:   01/11/21 1343  BP: (!) 152/70  Pulse: 60  Resp: 16  Temp: 98.7 F (37.1 C)  TempSrc: Tympanic  SpO2: 100%  Weight: 95.3 kg     Physical Exam Vitals and nursing note  reviewed.  HENT:     Head: Normocephalic and atraumatic.     Nose: No congestion.     Mouth/Throat:     Mouth: Mucous membranes are moist.     Pharynx: Oropharynx is clear.  Eyes:     General:  Right eye: No discharge.        Left eye: No discharge.     Extraocular Movements: Extraocular movements intact.     Pupils: Pupils are equal, round, and reactive to light.  Cardiovascular:     Rate and Rhythm: Normal rate and regular rhythm.     Pulses: Normal pulses.     Heart sounds: No murmur heard. Pulmonary:     Effort: Pulmonary effort is normal.  Abdominal:     General: Bowel sounds are normal. There is no distension.     Tenderness: There is no abdominal tenderness. There is no guarding.  Musculoskeletal:        General: No swelling or deformity.     Right lower leg: No edema.     Left lower leg: No edema.     Right Lower Extremity: Right leg is amputated above knee.  Skin:    General: Skin is warm and dry.     Capillary Refill: Capillary refill takes less than 2 seconds.  Neurological:     Mental Status: He is alert and oriented to person, place, and time.  Psychiatric:        Mood and Affect: Mood normal.        Thought Content: Thought content normal.      CMP Latest Ref Rng & Units 12/28/2020  Glucose 70 - 99 mg/dL 135(H)  BUN 6 - 23 mg/dL 16  Creatinine 0.40 - 1.50 mg/dL 0.75  Sodium 135 - 145 mEq/L 140  Potassium 3.5 - 5.1 mEq/L 4.2  Chloride 96 - 112 mEq/L 104  CO2 19 - 32 mEq/L 25  Calcium 8.4 - 10.5 mg/dL 9.0  Total Protein 6.5 - 8.1 g/dL -  Total Bilirubin 0.3 - 1.2 mg/dL -  Alkaline Phos 38 - 126 U/L -  AST 15 - 41 U/L -  ALT 0 - 44 U/L -   CBC Latest Ref Rng & Units 12/28/2020  WBC 4.0 - 10.5 K/uL 2.6(L)  Hemoglobin 13.0 - 17.0 g/dL 8.3 Repeated and verified X2.(L)  Hematocrit 39.0 - 52.0 % 24.4 Repeated and verified X2.(L)  Platelets 150.0 - 400.0 K/uL 36.0 Repeated and verified X2.(LL)     Assessment and Plan- Patient is a 70 y.o. 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  Normocytic anemia: Appears to wax and wane since July 2021-hemoglobin 8.7 Labs from 12/28/20 shows hemoglobin of 8.3. Prevuous work-up shows coombs test was negative, PNH profile negative.  Haptoglobin remains less than 10 and reticulocyte count elevated at 11.7 in August 2021, but down to 11.7 in September 2021. No indication for bone marrow biopsy at this time.   He would also not benefit from steroids or Rituxan as this is nonimmune hemolysis.  He will continue folic acid supplements. Labs from 01/11/21 show hemoglobin 9.1, platelets 44,000.  2.  Thrombocytopenia Secondary to cirrhosis and splenomegaly.  Has u/s scheduled on 01/25/21.  Has f/u with GI on 01/26/21.  Platelet count on 01/11/21 was 44,000 which is his baseline.  No s/s of bleeding.   3.  Leukopenia:  Mild likely secondary to splenic sequestration.   WBC on 01/11/21 is 2700, ANC 1500. Continue to monitor.   4. [place] surveillance:  Baseline AFP was normal ABD [place] cheduled for 01/25/2021.  He has scheduled follow-up.   5. Leg swelling; Followed by vein and vascular and has UNA boots adjusted every Thursday.   Visit  Diagnosis 1. Normocytic anemia   2. Thrombocytopenia (HCC)    Disposition:  RTC in 1 months for MD assessment and monitor of labs.   The patient's diagnosis, an outline of the further diagnostic and laboratory studies which will be required, the recommendation for surgery, and alternatives were discussed with her and her accompanying family members.  All questions were answered to their satisfaction.  I personally had a face to face interaction and evaluated the patient jointly with the NP Student, Mrs. Benedetto Goad.  I have reviewed her history and available records and have performed the key portions of the physical exam including general, HEENT, abdominal exam, pelvic exam with my  findings confirming those documented above by the APP student.  I have discussed the case with the APP student and the patient.  I agree with the above documentation, assessment and plan which was fully formulated by me.  Counseling was completed by me.    Benedetto Goad, Student FNP  Greater than 50% was spent in counseling and coordination of care with this patient including but not limited to discussion of the relevant topics above (See A&P) including, but not limited to diagnosis and management of acute and chronic medical conditions.   Faythe Casa, NP 01/27/2021 9:47 AM

## 2021-01-11 NOTE — Progress Notes (Signed)
Pt reports discharged from hospital 2 weeks.  Right leg amputated.  Pt reports had 4 blood transfusions and 2 platelet transfusions in hospital.  Pt reports has unna boot changed on left leg every week.

## 2021-01-16 ENCOUNTER — Encounter (INDEPENDENT_AMBULATORY_CARE_PROVIDER_SITE_OTHER): Payer: Self-pay | Admitting: Nurse Practitioner

## 2021-01-16 NOTE — Progress Notes (Signed)
Subjective:    Patient ID: [name]., male    DOB: 1950-09-18, 70 y.o.   MRN: [id number] Chief Complaint  Patient presents with   New Patient (Initial Visit)    NP concern for PAD lymphedema . Consult scan done 12/14/20 fir DVT [facility] referred by arnett Vinson Moselle Maharaj is a 70 year old male that presents today after recent evaluation in the emergency room at [facility].  The patient had redness and weeping was seen at [facility].  Today the patient has extensive swelling still.  The patient was treated for cellulitis while at the hospital.  However, he still has persistent swelling.  The patient previously was able to ambulate on crutches despite his right above-knee amputation.  However recently due to balance issues and weakness he is unable to do that any longer.  The patient does have a previous history of portal vein hypertension with previously treated hepatitis C.  He denies any fevers or chills.  He denies any claudication-like symptoms.   Review of Systems  Cardiovascular:  Positive for leg swelling.  Skin:  Negative for wound.  Neurological:  Positive for weakness.  All other systems reviewed and are negative.     Objective:   Physical Exam Vitals reviewed.  HENT:     Head: Normocephalic.  Cardiovascular:     Rate and Rhythm: Normal rate.     Pulses: Normal pulses.  Pulmonary:     Effort: Pulmonary effort is normal.  Musculoskeletal:     Left lower leg: 2+ Pitting Edema present.     Right Lower Extremity: Right leg is amputated above knee.  Neurological:     Mental Status: He is alert and oriented to person, place, and time.     Motor: Weakness present.     Gait: Gait abnormal.  Psychiatric:        Mood and Affect: Mood normal.        Behavior: Behavior normal.        Thought Content: Thought content normal.        Judgment: Judgment normal.    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   FRACTURE, WRIST, LEFT 10/12/2009   Qualifier: Diagnosis of  By: Loanne Drilling MD, Sean A    HEPATITIS C 03/24/2007   HYPERLIPIDEMIA 09/12/2009   Hypocalcemia 12/23/2007   Qualifier: Diagnosis of  By: Loanne Drilling MD, Sean A    Pancytopenia 12/23/2007   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   Squamous cell carcinoma of skin 05/21/2018   R mid volar forearm   Thyroid disease    UROLITHIASIS, HX OF 12/23/2007    Social History   Socioeconomic History   Marital status: Widowed    Spouse name: Not on file   Number of children: Not on file   Years of education: Not on file   Highest education level: Not on file  Occupational History   Occupation: Disabled  Tobacco Use   Smoking status: Never   Smokeless tobacco: Never  Substance and Sexual Activity   Alcohol use: Not Currently    Alcohol/week: 0.0 standard drinks   Drug  use: No   Sexual activity: Not on file  Other Topics Concern   Not on file  Social History Narrative   Widowed 1997   Lives alone   Lost a son in his arms in 28.    3 sons   Social Determinants of Health   Financial Resource Strain: Low Risk    Difficulty of Paying Living Expenses: Not hard at all  Food Insecurity: No Food Insecurity   Worried About Charity fundraiser in the Last Year: Never true   Arboriculturist in the Last Year: Never true  Transportation Needs: No Transportation Needs   Lack of Transportation (Medical): No   Lack of Transportation (Non-Medical): No  Physical Activity: Not on file  Stress: No Stress Concern Present   Feeling of Stress : Not at all  Social Connections: Unknown   Frequency of Communication with Friends and Family: More than three times a week   Frequency of Social Gatherings with Friends and Family: More than three times a week    Attends Religious Services: Not on file   Active Member of Clubs or Organizations: Not on file   Attends Archivist Meetings: Not on file   Marital Status: Widowed  Intimate Partner Violence: Not At Risk   Fear of Current or Ex-Partner: No   Emotionally Abused: No   Physically Abused: No   Sexually Abused: No    Past Surgical History:  Procedure Laterality Date   ABOVE KNEE LEG AMPUTATION Right    Right femur fracture, 11/1994, resulted in right AKA 2006 after staph infection   COLONOSCOPY N/A 12/16/2020   Procedure: COLONOSCOPY;  Surgeon: Virgel Manifold, MD;  Location: [facility] ENDOSCOPY;  Service: Endoscopy;  Laterality: N/A;   ESOPHAGOGASTRODUODENOSCOPY N/A 12/16/2020   Procedure: ESOPHAGOGASTRODUODENOSCOPY (EGD);  Surgeon: Virgel Manifold, MD;  Location: [facility] ENDOSCOPY;  Service: Endoscopy;  Laterality: N/A;    Family History  Problem Relation Age of Onset   Heart disease Mother    Lung cancer Mother 78   Heart attack Paternal Grandmother    Heart attack Paternal Grandfather    Liver cancer Neg Hx     Allergies  Allergen Reactions   Clindamycin Dermatitis    CBC Latest Ref Rng & Units 01/11/2021 12/28/2020 12/18/2020  WBC 4.0 - 10.5 K/uL 2.7(L) 2.6(L) 2.2(L)  Hemoglobin 13.0 - 17.0 g/dL 9.1(L) 8.3 Repeated and verified X2.(L) 8.1(L)  Hematocrit 39.0 - 52.0 % 26.5(L) 24.4 Repeated and verified X2.(L) 25.1(L)  Platelets 150 - 400 K/uL 44(L) 36.0 Repeated and verified X2.(LL) 37(L)      CMP     Component Value Date/Time   NA 135 01/11/2021 1518   K 4.0 01/11/2021 1518   CL 100 01/11/2021 1518   CO2 26 01/11/2021 1518   GLUCOSE 132 (H) 01/11/2021 1518   BUN 18 01/11/2021 1518   CREATININE 0.68 01/11/2021 1518   CALCIUM 9.3 01/11/2021 1518   CALCIUM 9.8 02/26/2012 1428   PROT 7.9 01/11/2021 1518   ALBUMIN 4.2 01/11/2021 1518   AST 25 01/11/2021 1518   ALT 19 01/11/2021 1518   ALKPHOS 61 01/11/2021 1518   BILITOT 2.1 (H) 01/11/2021 1518   GFRNONAA  >60 01/11/2021 1518   GFRAA >60 04/06/2020 1507     No results found.     Assessment & Plan:   1. Left leg swelling No surgery or intervention at this point in time.    I have had a long discussion with the patient  regarding venous insufficiency and why it  causes symptoms, specifically venous ulceration . I have discussed with the patient the chronic skin changes that accompany venous insufficiency and the long term sequela such as infection and recurring  ulceration.  Patient will be placed in [employer] which will be changed weekly drainage permitting.  In addition, behavioral modification including several periods of elevation of the lower extremities during the day will be continued. Achieving a position with the ankles at heart level was stressed to the patient  The patient is instructed to begin routine exercise, especially walking on a daily basis  Patient should undergo duplex ultrasound of the venous system to ensure that DVT or reflux is not present.  Following the review of the ultrasound the patient will follow up in four weeks to reassess the degree of swelling and the control that Unna therapy is offering.   The patient can be assessed for graduated compression stockings or wraps as well as a Lymph Pump once the ulcers are healed.   2. Primary hypertension Continue antihypertensive medications as already ordered, these medications have been reviewed and there are no changes at this time.   3. Dyslipidemia Continue statin as ordered and reviewed, no changes at this time    Current Outpatient Medications on File Prior to Visit  Medication Sig Dispense Refill   ALPRAZolam (XANAX) 1 MG tablet Take 1 mg by mouth 3 (three) times daily.     Cholecalciferol (VITAMIN D3) 10 MCG (400 UNIT) CAPS Take 4 capsules by mouth daily.     folic acid (FOLVITE) 1 MG tablet TAKE 1 TABLET(1 MG) BY MOUTH DAILY 30 tablet 3   levothyroxine (SYNTHROID) 50 MCG tablet TAKE 1 TABLET(50 MCG) BY  MOUTH DAILY BEFORE BREAKFAST 90 tablet 1   losartan (COZAAR) 100 MG tablet TAKE 1 TABLET(100 MG) BY MOUTH DAILY 90 tablet 1   lubiprostone (AMITIZA) 24 MCG capsule Take 24 mcg by mouth daily as needed.     morphine (MS CONTIN) 15 MG 12 hr tablet Take 15 mg by mouth 2 (two) times daily as needed.     NARCAN 4 MG/0.1ML LIQD nasal spray kit SPRAY 0.1 ML (4 MG) IN 1 NOSTRIL/ MAY REPEAT DOSE EVERY 2 3 MINUTES AS NEEDED ALTERNATING NOSTRILS (Patient not taking: Reported on 01/11/2021)     oxycodone (ROXICODONE) 30 MG immediate release tablet Take 1 tablet by mouth 5 (five) times daily as needed.     pantoprazole (PROTONIX) 40 MG tablet Take 1 tablet (40 mg total) by mouth daily. 30 tablet 0   propranolol (INDERAL) 40 MG tablet TAKE 1 TABLET(40 MG) BY MOUTH TWICE DAILY 180 tablet 1   vitamin B-12 (CYANOCOBALAMIN) 1000 MCG tablet Take 1 tablet (1,000 mcg total) by mouth daily.     No current facility-administered medications on file prior to visit.    There are no Patient Instructions on file for this visit. No follow-ups on file.   Kris Hartmann, NP

## 2021-01-17 ENCOUNTER — Ambulatory Visit (INDEPENDENT_AMBULATORY_CARE_PROVIDER_SITE_OTHER): Payer: Medicare Other | Admitting: Nurse Practitioner

## 2021-01-17 ENCOUNTER — Other Ambulatory Visit: Payer: Self-pay

## 2021-01-17 ENCOUNTER — Encounter (INDEPENDENT_AMBULATORY_CARE_PROVIDER_SITE_OTHER): Payer: Self-pay

## 2021-01-17 VITALS — BP 147/78 | HR 65 | Resp 16

## 2021-01-17 DIAGNOSIS — M7989 Other specified soft tissue disorders: Secondary | ICD-10-CM

## 2021-01-17 DIAGNOSIS — R600 Localized edema: Secondary | ICD-10-CM

## 2021-01-17 NOTE — Progress Notes (Signed)
History of Present Illness  There is no documented history at this time  Assessments & Plan   There are no diagnoses linked to this encounter.    Additional instructions  Subjective:  Patient presents with venous ulcer of the Left lower extremity.    Procedure:  3 layer unna wrap was placed Left lower extremity.   Plan:   Follow up in one week.  

## 2021-01-22 ENCOUNTER — Other Ambulatory Visit: Payer: Self-pay | Admitting: Oncology

## 2021-01-23 ENCOUNTER — Encounter (INDEPENDENT_AMBULATORY_CARE_PROVIDER_SITE_OTHER): Payer: Medicare Other

## 2021-01-23 ENCOUNTER — Encounter (INDEPENDENT_AMBULATORY_CARE_PROVIDER_SITE_OTHER): Payer: Self-pay | Admitting: Nurse Practitioner

## 2021-01-24 ENCOUNTER — Ambulatory Visit (INDEPENDENT_AMBULATORY_CARE_PROVIDER_SITE_OTHER): Payer: Medicare Other | Admitting: Nurse Practitioner

## 2021-01-24 ENCOUNTER — Encounter (INDEPENDENT_AMBULATORY_CARE_PROVIDER_SITE_OTHER): Payer: Self-pay

## 2021-01-24 ENCOUNTER — Encounter (INDEPENDENT_AMBULATORY_CARE_PROVIDER_SITE_OTHER): Payer: Medicare Other

## 2021-01-24 ENCOUNTER — Other Ambulatory Visit: Payer: Self-pay

## 2021-01-24 VITALS — BP 145/82 | HR 60 | Resp 16

## 2021-01-24 DIAGNOSIS — M7989 Other specified soft tissue disorders: Secondary | ICD-10-CM

## 2021-01-24 DIAGNOSIS — R600 Localized edema: Secondary | ICD-10-CM | POA: Diagnosis not present

## 2021-01-24 NOTE — Progress Notes (Signed)
History of Present Illness  There is no documented history at this time  Assessments & Plan   There are no diagnoses linked to this encounter.    Additional instructions  Subjective:  Patient presents with venous ulcer of the Left lower extremity.    Procedure:  3 layer unna wrap was placed Left lower extremity.   Plan:   Follow up in one week.  

## 2021-01-25 ENCOUNTER — Ambulatory Visit
Admission: RE | Admit: 2021-01-25 | Discharge: 2021-01-25 | Disposition: A | Discharge status: Home / Self Care | Payer: Medicare Other | Source: Ambulatory Visit | Attending: Oncology | Admitting: Oncology

## 2021-01-25 DIAGNOSIS — K7469 Other cirrhosis of liver: Secondary | ICD-10-CM | POA: Insufficient documentation

## 2021-01-26 ENCOUNTER — Other Ambulatory Visit: Payer: Self-pay

## 2021-01-26 ENCOUNTER — Ambulatory Visit (INDEPENDENT_AMBULATORY_CARE_PROVIDER_SITE_OTHER): Payer: Medicare Other | Admitting: Gastroenterology

## 2021-01-26 VITALS — BP 127/75 | HR 69 | Ht 72.0 in

## 2021-01-26 DIAGNOSIS — D500 Iron deficiency anemia secondary to blood loss (chronic): Secondary | ICD-10-CM

## 2021-01-26 NOTE — Progress Notes (Signed)
Primary Care Physician: [name], FNP  Primary Gastroenterologist:  Dr. Lucilla Lame  No chief complaint on file.   HPI: Derek Guillet Cundari [name]. is a 70 y.o. male here for follow-up after being in the hospital with anemia.  The patient underwent an EGD and colonoscopy and was noted to have 3 polyps that were not removed due to his low platelets.  The patient was recommended to follow-up with me for treatment of his thrombocytopenia and removal of the polyps.  The patient also had an ultrasound done yesterday that showed:  IMPRESSION: 1. Cirrhotic hepatic morphology with sequela of portal hypertension including splenomegaly. No focal hepatic lesion identified. 2. Cholelithiasis and biliary sludge without evidence of acute cholecystitis.  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 The patient denies any problems at the present time and has been following up with hematology.  The patient states that his platelets were higher recently than when he was in the hospital.  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     Cholecalciferol (VITAMIN D3) 10 MCG (400 UNIT) CAPS Take 4 capsules by mouth daily.     folic acid (FOLVITE) 1 MG tablet TAKE 1 TABLET(1 MG) BY  MOUTH DAILY 30 tablet 3   levothyroxine (SYNTHROID) 50 MCG tablet TAKE 1 TABLET(50 MCG) BY MOUTH DAILY BEFORE BREAKFAST 90 tablet 1   losartan (COZAAR) 100 MG tablet TAKE 1 TABLET(100 MG) BY MOUTH DAILY 90 tablet 1   lubiprostone (AMITIZA) 24 MCG capsule Take 24 mcg by mouth daily as needed.     meloxicam (MOBIC) 15 MG tablet Take 15 mg by mouth every morning.     morphine (MS CONTIN) 15 MG 12 hr tablet Take 15 mg by mouth 2 (two) times daily as needed.     NARCAN 4 MG/0.1ML LIQD nasal spray kit SPRAY 0.1 ML (4 MG) IN 1 NOSTRIL/ MAY REPEAT DOSE EVERY 2 3 MINUTES AS NEEDED ALTERNATING NOSTRILS (Patient not taking: No sig reported)     oxycodone (ROXICODONE) 30 MG immediate release tablet Take 1 tablet by mouth 5 (five) times daily as needed.     pantoprazole (PROTONIX) 40 MG tablet Take 1 tablet (40 mg total) by mouth daily. 30 tablet 0   propranolol (INDERAL) 40 MG tablet TAKE 1 TABLET(40 MG) BY MOUTH TWICE DAILY 180 tablet 1   vitamin B-12 (CYANOCOBALAMIN) 1000 MCG tablet Take 1 tablet (1,000 mcg total) by mouth daily.     No current facility-administered medications for this visit.    Allergies as of 01/26/2021 - Review Complete 01/24/2021  Allergen Reaction Noted   Clindamycin Dermatitis 07/08/2012    ROS:  General:  Negative for anorexia, weight loss, fever, chills, fatigue, weakness. ENT: Negative for hoarseness, difficulty swallowing , nasal congestion. CV: Negative for chest pain, angina, palpitations, dyspnea on exertion, peripheral edema.  Respiratory: Negative for dyspnea at rest, dyspnea on exertion, cough, sputum, wheezing.  GI: See history of present illness. GU:  Negative for dysuria, hematuria, urinary incontinence, urinary frequency, nocturnal urination.  Endo: Negative for unusual weight change.    Physical Examination:   There were no vitals taken for this visit.  General: Well-nourished, well-developed in no acute distress.  Eyes: No icterus. Conjunctivae  pink. Neuro: Alert and oriented x 3.  Grossly intact. Skin: Warm and dry, no jaundice.   Psych: Alert and cooperative, normal mood and affect.  Labs:    Imaging Studies: US Abdomen Complete  Result Date: 01/25/2021 CLINICAL DATA:  History of cirrhosis, HCC screen, assess for splenomegaly EXAM: ABDOMEN ULTRASOUND COMPLETE COMPARISON:  Ultrasound January 01, 2020 FINDINGS: Gallbladder: Biliary sludge with gallstones measuring up to 9 mm. No pericholecystic fluid or wall thickening visualized. No sonographic Murphy sign noted by sonographer. Common bile duct: Diameter: 5 mm Liver: No focal lesion identified. Coarsened hepatic echotexture with contour nodularity. Portal vein is patent on color Doppler imaging with normal direction of blood flow towards the liver. IVC: No abnormality visualized. Pancreas: Visualized portion unremarkable. Spleen: Splenomegaly with a splenic volume of 1029 cc. Right Kidney: Length: 12.7 cm. Echogenicity within normal limits. No mass or hydronephrosis visualized. Left Kidney: Length: 13.7 cm. Echogenicity within normal limits. No mass or hydronephrosis visualized. Abdominal aorta: No aneurysm visualized. Other findings: None. IMPRESSION: 1. Cirrhotic hepatic morphology with sequela of portal hypertension including splenomegaly. No focal hepatic lesion identified. 2. Cholelithiasis and biliary sludge without evidence of acute cholecystitis. Electronically Signed   By: Dahlia Bailiff MD   On: 01/25/2021 16:57    Assessment and Plan:   Derek Anon Stull [name]. is a 70 y.o. y/o male who comes in today with a history of cirrhosis from hepatitis C and anemia.  The patient had an EGD and colonoscopy while in the hospital by one of my partners.  The patient had polyps but his platelets were too low and the polyps were not removed.  The patient will be started on medication to increase his platelet count and then he will have the colonoscopy done when his platelet count has increased.  The  patient has been explained the plan and agrees with it.     Lucilla Lame, MD. Marval Regal    Note: This dictation was prepared with Dragon dictation along with smaller phrase technology. Any transcriptional errors that result from this process are unintentional.

## 2021-01-31 ENCOUNTER — Other Ambulatory Visit: Payer: Self-pay

## 2021-01-31 DIAGNOSIS — Z86010 Personal history of colonic polyps: Secondary | ICD-10-CM

## 2021-01-31 MED ORDER — SUPREP BOWEL PREP KIT 17.5-3.13-1.6 GM/177ML PO SOLN: 1.0000 | ORAL | 0 refills | Status: DC

## 2021-02-01 ENCOUNTER — Ambulatory Visit (INDEPENDENT_AMBULATORY_CARE_PROVIDER_SITE_OTHER): Payer: Medicare Other | Admitting: Nurse Practitioner

## 2021-02-01 ENCOUNTER — Encounter (INDEPENDENT_AMBULATORY_CARE_PROVIDER_SITE_OTHER): Payer: Self-pay | Admitting: Nurse Practitioner

## 2021-02-01 ENCOUNTER — Other Ambulatory Visit: Payer: Self-pay

## 2021-02-01 VITALS — BP 134/71 | HR 83 | Ht 72.0 in | Wt 210.0 lb

## 2021-02-01 DIAGNOSIS — R600 Localized edema: Secondary | ICD-10-CM | POA: Diagnosis not present

## 2021-02-01 DIAGNOSIS — M7989 Other specified soft tissue disorders: Secondary | ICD-10-CM

## 2021-02-01 NOTE — Progress Notes (Signed)
History of Present Illness  There is no documented history at this time  Assessments & Plan   There are no diagnoses linked to this encounter.    Additional instructions  Subjective:  Patient presents with venous ulcer of the Left lower extremity.    Procedure:  3 layer unna wrap was placed Left lower extremity.   Plan:   Follow up in one week.  

## 2021-02-03 ENCOUNTER — Other Ambulatory Visit: Payer: Self-pay

## 2021-02-03 ENCOUNTER — Inpatient Hospital Stay (HOSPITAL_BASED_OUTPATIENT_CLINIC_OR_DEPARTMENT_OTHER): Payer: Medicare Other | Admitting: Oncology

## 2021-02-03 ENCOUNTER — Encounter: Payer: Self-pay | Admitting: Oncology

## 2021-02-03 ENCOUNTER — Inpatient Hospital Stay: Payer: Medicare Other | Attending: Oncology

## 2021-02-03 VITALS — BP 145/65 | HR 62 | Temp 98.6°F | Resp 18 | Wt 210.0 lb

## 2021-02-03 DIAGNOSIS — D61818 Other pancytopenia: Secondary | ICD-10-CM | POA: Diagnosis not present

## 2021-02-03 DIAGNOSIS — D6959 Other secondary thrombocytopenia: Secondary | ICD-10-CM | POA: Diagnosis not present

## 2021-02-03 DIAGNOSIS — D72819 Decreased white blood cell count, unspecified: Secondary | ICD-10-CM | POA: Diagnosis not present

## 2021-02-03 DIAGNOSIS — D649 Anemia, unspecified: Secondary | ICD-10-CM | POA: Diagnosis present

## 2021-02-03 DIAGNOSIS — K7460 Unspecified cirrhosis of liver: Secondary | ICD-10-CM | POA: Diagnosis not present

## 2021-02-03 DIAGNOSIS — Z993 Dependence on wheelchair: Secondary | ICD-10-CM | POA: Diagnosis not present

## 2021-02-03 DIAGNOSIS — D696 Thrombocytopenia, unspecified: Secondary | ICD-10-CM | POA: Diagnosis not present

## 2021-02-03 LAB — RETICULOCYTES
Immature Retic Fract: 19.5 % — ABNORMAL HIGH (ref 2.3–15.9)
RBC.: 2.2 MIL/uL — ABNORMAL LOW (ref 4.22–5.81)
Retic Count, Absolute: 241.8 10*3/uL — ABNORMAL HIGH (ref 19.0–186.0)
Retic Ct Pct: 11 % — ABNORMAL HIGH (ref 0.4–3.1)

## 2021-02-03 LAB — CBC WITH DIFFERENTIAL/PLATELET
Abs Immature Granulocytes: 0.01 10*3/uL (ref 0.00–0.07)
Basophils Absolute: 0 10*3/uL (ref 0.0–0.1)
Basophils Relative: 1 %
Eosinophils Absolute: 0.1 10*3/uL (ref 0.0–0.5)
Eosinophils Relative: 5 %
HCT: 22.4 % — ABNORMAL LOW (ref 39.0–52.0)
Hemoglobin: 7.5 g/dL — ABNORMAL LOW (ref 13.0–17.0)
Immature Granulocytes: 1 %
Lymphocytes Relative: 25 %
Lymphs Abs: 0.5 10*3/uL — ABNORMAL LOW (ref 0.7–4.0)
MCH: 34.9 pg — ABNORMAL HIGH (ref 26.0–34.0)
MCHC: 33.5 g/dL (ref 30.0–36.0)
MCV: 104.2 fL — ABNORMAL HIGH (ref 80.0–100.0)
Monocytes Absolute: 0.2 10*3/uL (ref 0.1–1.0)
Monocytes Relative: 10 %
Neutro Abs: 1.2 10*3/uL — ABNORMAL LOW (ref 1.7–7.7)
Neutrophils Relative %: 58 %
Platelets: 35 10*3/uL — ABNORMAL LOW (ref 150–400)
RBC: 2.15 MIL/uL — ABNORMAL LOW (ref 4.22–5.81)
RDW: 20.9 % — ABNORMAL HIGH (ref 11.5–15.5)
Smear Review: NORMAL
WBC: 1.9 10*3/uL — ABNORMAL LOW (ref 4.0–10.5)
nRBC: 0 % (ref 0.0–0.2)

## 2021-02-03 LAB — COMPREHENSIVE METABOLIC PANEL
ALT: 13 U/L (ref 0–44)
AST: 17 U/L (ref 15–41)
Albumin: 3.8 g/dL (ref 3.5–5.0)
Alkaline Phosphatase: 59 U/L (ref 38–126)
Anion gap: 8 (ref 5–15)
BUN: 11 mg/dL (ref 8–23)
CO2: 27 mmol/L (ref 22–32)
Calcium: 9 mg/dL (ref 8.9–10.3)
Chloride: 100 mmol/L (ref 98–111)
Creatinine, Ser: 0.71 mg/dL (ref 0.61–1.24)
GFR, Estimated: >60 mL/min (ref >60)
Glucose, Bld: 153 mg/dL — ABNORMAL HIGH (ref 70–99)
Potassium: 3.6 mmol/L (ref 3.5–5.1)
Sodium: 135 mmol/L (ref 135–145)
Total Bilirubin: 2.2 mg/dL — ABNORMAL HIGH (ref 0.3–1.2)
Total Protein: 7.1 g/dL (ref 6.5–8.1)

## 2021-02-03 NOTE — Progress Notes (Signed)
Concerns of joint pain

## 2021-02-03 NOTE — Progress Notes (Signed)
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  He has seen Dr. Allen Norris for cirrhosis in the past.  He has not had any recent episodes of ascites.  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  With regards to his CBC patient has had baseline thrombocytopenia and his platelet counts mainly fluctuate between 40-60 at least since 2015.  He also has chronic leukopenia with a white count that fluctuates between 2.3-3.5 at least dating back to 2015.  Differential has mainly showed lymphopenia but at times neutropenia.  Hemoglobin has been mostly between 9.5-10.5 most recently patient was found to have a white count of 3.5, H&H of 9.7/28.8 and a platelet count of 65 and has been referred to [place] for pancytopenia.  In terms of his CMP he has had elevated bilirubin which fluctuates between 1.6-2.9 in the past but was recently noted to have a bilirubin of 3.1 mostly indirect hyperbilirubinemia with normal AST and ALT   Patient uses a motorized wheelchair for ambulation but is  independent of his ADLs and IADLs and is able to drive as well.  Appetite and weight have been stable.  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  Currently patient denies any bleeding in his stool or urine.  Denies any dark tarry stools   Results of blood work from 12/21/2019 were as follows: CBC showed white count of 6.2, H&H of 10.3/30.2 with an MCV of 96 and a platelet count of 99.  Total bilirubin was elevated at 3.1 with predominantly indirect hyperbilirubinemia.  Folic acid was normal at 6.6.  B12 levels were elevated at 1441.  Ferritin levels were elevated at 540 iron studies were normal.  Haptoglobin was less than 10.  Reticulocyte count was elevated at 10.6.  Myeloma panel showed no M protein.  LDH was mildly elevated at 256.  Coombs test was negative.  AFP was normal at 4.3.   Interval history-patient currently reports feeling fatigued.  She he also has current ongoing cellulitis of his left lower extremity for which she sees wound care.  ECOG PS- 2 Pain scale- 0  Review of systems- Review of Systems  Constitutional:  Positive for malaise/fatigue. Negative for chills, fever and weight loss.  HENT:  Negative for congestion, ear discharge and nosebleeds.   Eyes:  Negative for blurred vision.  Respiratory:  Negative for cough, hemoptysis, sputum production, shortness of breath and wheezing.   Cardiovascular:  Negative for chest pain, palpitations, orthopnea and claudication.  Gastrointestinal:  Negative for abdominal pain, blood in stool, constipation, diarrhea, heartburn, melena, nausea and vomiting.  Genitourinary:  Negative for dysuria, flank pain, frequency, hematuria and  urgency.  Musculoskeletal:  Negative for back pain, joint pain and myalgias.  Skin:  Negative for rash.  Neurological:  Negative for dizziness, tingling, focal weakness, seizures, weakness and headaches.   Endo/Heme/Allergies:  Does not bruise/bleed easily.  Psychiatric/Behavioral:  Negative for depression and suicidal ideas. The patient does not have insomnia.     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   Squamous cell carcinoma of skin 05/21/2018   R mid volar forearm   Thyroid disease    UROLITHIASIS, HX OF 12/23/2007     Past Surgical History:  Procedure Laterality Date   ABOVE KNEE LEG AMPUTATION Right    Right femur fracture, 11/1994, resulted in right AKA 2006 after staph infection   COLONOSCOPY N/A 12/16/2020   Procedure: COLONOSCOPY;  Surgeon: Virgel Manifold, MD;  Location: [facility] ENDOSCOPY;  Service: Endoscopy;  Laterality: N/A;   ESOPHAGOGASTRODUODENOSCOPY N/A 12/16/2020   Procedure: ESOPHAGOGASTRODUODENOSCOPY (EGD);  Surgeon: Virgel Manifold, MD;  Location: [facility] ENDOSCOPY;  Service: Endoscopy;  Laterality: N/A;    Social History   Socioeconomic History   Marital status: Widowed    Spouse name: Not on file   Number of children: Not on file   Years of education: Not on file   Highest education level: Not on file  Occupational History   Occupation: Disabled  Tobacco Use   Smoking status: Never   Smokeless tobacco: Never  Substance and Sexual Activity   Alcohol use: Not Currently    Alcohol/week: 0.0 standard drinks   Drug use: No   Sexual activity: Not on file  Other Topics Concern   Not on file  Social History Narrative   Widowed 1997    Lives alone   Lost a son in his arms in 57.    3 sons   Social Determinants of Health   Financial Resource Strain: Low Risk    Difficulty of Paying Living Expenses: Not hard at all  Food Insecurity: No Food Insecurity   Worried About Charity fundraiser in the Last Year: Never true   Arboriculturist in the Last Year: Never true  Transportation Needs: No Transportation Needs   Lack of Transportation (Medical): No   Lack of Transportation (Non-Medical): No  Physical Activity: Not on file  Stress: No Stress Concern Present   Feeling of Stress : Not at all  Social Connections: Unknown   Frequency of Communication with Friends and Family: More than three times a week   Frequency of Social Gatherings with Friends and Family: More than three times a week   Attends Religious Services: Not on file   Active Member of Clubs or Organizations: Not on file   Attends Archivist Meetings: Not on file   Marital Status: Widowed  Intimate Partner Violence: Not At Risk   Fear of Current or Ex-Partner: No   Emotionally Abused: No   Physically Abused:  No   Sexually Abused: No    Family History  Problem Relation Age of Onset   Heart disease Mother    Lung cancer Mother 34   Heart attack Paternal Grandmother    Heart attack Paternal Grandfather    Liver cancer Neg Hx      Current Outpatient Medications:    ALPRAZolam (XANAX) 1 MG tablet, Take 1 mg by mouth 3 (three) times daily., Disp: , Rfl:    Cholecalciferol (VITAMIN D3) 10 MCG (400 UNIT) CAPS, Take 4 capsules by mouth daily., Disp: , Rfl:    folic acid (FOLVITE) 1 MG tablet, TAKE 1 TABLET(1 MG) BY MOUTH DAILY, Disp: 30 tablet, Rfl: 3   levothyroxine (SYNTHROID) 50 MCG tablet, TAKE 1 TABLET(50 MCG) BY MOUTH DAILY BEFORE BREAKFAST, Disp: 90 tablet, Rfl: 1   losartan (COZAAR) 100 MG tablet, TAKE 1 TABLET(100 MG) BY MOUTH DAILY, Disp: 90 tablet, Rfl: 1   lubiprostone (AMITIZA) 24 MCG capsule, Take 24 mcg by mouth daily as needed.,  Disp: , Rfl:    meloxicam (MOBIC) 15 MG tablet, Take 15 mg by mouth every morning., Disp: , Rfl:    morphine (MS CONTIN) 15 MG 12 hr tablet, Take 15 mg by mouth 2 (two) times daily as needed., Disp: , Rfl:    Na Sulfate-K Sulfate-Mg Sulf (SUPREP BOWEL PREP KIT) 17.5-3.13-1.6 GM/177ML SOLN, Take 1 kit by mouth as directed., Disp: 354 mL, Rfl: 0   oxycodone (ROXICODONE) 30 MG immediate release tablet, Take 1 tablet by mouth 5 (five) times daily as needed., Disp: , Rfl:    propranolol (INDERAL) 40 MG tablet, TAKE 1 TABLET(40 MG) BY MOUTH TWICE DAILY, Disp: 180 tablet, Rfl: 1   vitamin B-12 (CYANOCOBALAMIN) 1000 MCG tablet, Take 1 tablet (1,000 mcg total) by mouth daily., Disp: , Rfl:    NARCAN 4 MG/0.1ML LIQD nasal spray kit, SPRAY 0.1 ML (4 MG) IN 1 NOSTRIL/ MAY REPEAT DOSE EVERY 2 3 MINUTES AS NEEDED ALTERNATING NOSTRILS (Patient not taking: No sig reported), Disp: , Rfl:    pantoprazole (PROTONIX) 40 MG tablet, Take 1 tablet (40 mg total) by mouth daily., Disp: 30 tablet, Rfl: 0  Physical exam:  Vitals:   02/03/21 1112  BP: (!) 145/65  Pulse: 62  Resp: 18  Temp: 98.6 F (37 C)  TempSrc: Tympanic  SpO2: 99%  Weight: 210 lb (95.3 kg)   Physical Exam Cardiovascular:     Rate and Rhythm: Normal rate and regular rhythm.     Heart sounds: Normal heart sounds.  Pulmonary:     Effort: Pulmonary effort is normal.     Breath sounds: Normal breath sounds.  Abdominal:     General: Bowel sounds are normal.     Palpations: Abdomen is soft.  Musculoskeletal:     Comments: S/p right AKA.  Dressing in place over left leg from ongoing cellulitis  Skin:    General: Skin is warm and dry.  Neurological:     Mental Status: He is alert and oriented to person, place, and time.     CMP Latest Ref Rng & Units 02/03/2021  Glucose 70 - 99 mg/dL 153(H)  BUN 8 - 23 mg/dL 11  Creatinine 0.61 - 1.24 mg/dL 0.71  Sodium 135 - 145 mmol/L 135  Potassium 3.5 - 5.1 mmol/L 3.6  Chloride 98 - 111 mmol/L 100   CO2 22 - 32 mmol/L 27  Calcium 8.9 - 10.3 mg/dL 9.0  Total Protein 6.5 - 8.1 g/dL 7.1  Total Bilirubin  0.3 - 1.2 mg/dL 2.2(H)  Alkaline Phos 38 - 126 U/L 59  AST 15 - 41 U/L 17  ALT 0 - 44 U/L 13   CBC Latest Ref Rng & Units 02/03/2021  WBC 4.0 - 10.5 K/uL 1.9(L)  Hemoglobin 13.0 - 17.0 g/dL 7.5(L)  Hematocrit 39.0 - 52.0 % 22.4(L)  Platelets 150 - 400 K/uL 35(L)    No images are attached to the encounter.  US Abdomen Complete  Result Date: 01/25/2021 CLINICAL DATA:  History of cirrhosis, HCC screen, assess for splenomegaly EXAM: ABDOMEN ULTRASOUND COMPLETE COMPARISON:  Ultrasound January 01, 2020 FINDINGS: Gallbladder: Biliary sludge with gallstones measuring up to 9 mm. No pericholecystic fluid or wall thickening visualized. No sonographic Murphy sign noted by sonographer. Common bile duct: Diameter: 5 mm Liver: No focal lesion identified. Coarsened hepatic echotexture with contour nodularity. Portal vein is patent on color Doppler imaging with normal direction of blood flow towards the liver. IVC: No abnormality visualized. Pancreas: Visualized portion unremarkable. Spleen: Splenomegaly with a splenic volume of 1029 cc. Right Kidney: Length: 12.7 cm. Echogenicity within normal limits. No mass or hydronephrosis visualized. Left Kidney: Length: 13.7 cm. Echogenicity within normal limits. No mass or hydronephrosis visualized. Abdominal aorta: No aneurysm visualized. Other findings: None. IMPRESSION: 1. Cirrhotic hepatic morphology with sequela of portal hypertension including splenomegaly. No focal hepatic lesion identified. 2. Cholelithiasis and biliary sludge without evidence of acute cholecystitis. Electronically Signed   By: Dahlia Bailiff MD   On: 01/25/2021 16:57     Assessment and plan- Patient is a 70 y.o. male with history of normocytic anemia attributed to nonimmune extravascular hemolysis secondary to cirrhosis here for routine follow-up  Patient has baseline pancytopenia with a white  count that fluctuates between 2.5-3.5.  It is lower at 1.9 today.  Patient's hemoglobin has been typically between 9.5-10.5 up until September 2021.  In May 2022 his hemoglobin dropped down to 6.1 and he required blood transfusion.  His hemoglobin had improved to 9.1 about 3 weeks ago and is again dropping down to 7.5 today with an MCV of 104.  He has baseline thrombocytopenia as well which has been attributed to his cirrhosis but that has also gradually decreased from 40s to 50s in September 20 21-35 presently.  While his anemia and his thrombocytopenia can still be explained because of his underlying cirrhosis given his worsening counts I would like to proceed with a bone marrow biopsy at this time to rule out any other etiology such as MDS.  I will be checking ferritin and iron studies B12 folate next week and also offer him 1 unit of PRBC transfusion in the meanwhile.  Patient comprehends my plan well   Visit Diagnosis 1. Pancytopenia ([place])   2. Normocytic anemia   3. Symptomatic anemia   4. Thrombocytopenia ([place])   5. Cirrhosis of liver without ascites, unspecified hepatic cirrhosis type ([place])      Dr. Randa Evens, MD, MPH [facility] [phone] 02/03/2021 3:40 PM

## 2021-02-04 LAB — HAPTOGLOBIN: Haptoglobin: <10 mg/dL — ABNORMAL LOW (ref 34–355)

## 2021-02-06 ENCOUNTER — Other Ambulatory Visit (INDEPENDENT_AMBULATORY_CARE_PROVIDER_SITE_OTHER): Payer: Self-pay | Admitting: Nurse Practitioner

## 2021-02-06 DIAGNOSIS — M7989 Other specified soft tissue disorders: Secondary | ICD-10-CM

## 2021-02-07 ENCOUNTER — Ambulatory Visit (INDEPENDENT_AMBULATORY_CARE_PROVIDER_SITE_OTHER): Payer: Medicare Other

## 2021-02-07 ENCOUNTER — Encounter (INDEPENDENT_AMBULATORY_CARE_PROVIDER_SITE_OTHER): Payer: Self-pay | Admitting: Nurse Practitioner

## 2021-02-07 ENCOUNTER — Other Ambulatory Visit: Payer: Self-pay

## 2021-02-07 ENCOUNTER — Ambulatory Visit (INDEPENDENT_AMBULATORY_CARE_PROVIDER_SITE_OTHER): Payer: Medicare Other | Admitting: Nurse Practitioner

## 2021-02-07 ENCOUNTER — Inpatient Hospital Stay: Payer: Medicare Other

## 2021-02-07 VITALS — BP 146/76 | HR 61 | Ht 72.0 in | Wt 210.0 lb

## 2021-02-07 DIAGNOSIS — M7989 Other specified soft tissue disorders: Secondary | ICD-10-CM | POA: Diagnosis not present

## 2021-02-07 DIAGNOSIS — D61818 Other pancytopenia: Secondary | ICD-10-CM

## 2021-02-07 DIAGNOSIS — I10 Essential (primary) hypertension: Secondary | ICD-10-CM | POA: Diagnosis not present

## 2021-02-07 DIAGNOSIS — R600 Localized edema: Secondary | ICD-10-CM | POA: Diagnosis not present

## 2021-02-07 DIAGNOSIS — E785 Hyperlipidemia, unspecified: Secondary | ICD-10-CM | POA: Diagnosis not present

## 2021-02-07 DIAGNOSIS — D696 Thrombocytopenia, unspecified: Secondary | ICD-10-CM

## 2021-02-07 DIAGNOSIS — D649 Anemia, unspecified: Secondary | ICD-10-CM | POA: Diagnosis not present

## 2021-02-07 LAB — CBC WITH DIFFERENTIAL/PLATELET
Abs Immature Granulocytes: 0.08 10*3/uL — ABNORMAL HIGH (ref 0.00–0.07)
Basophils Absolute: 0 10*3/uL (ref 0.0–0.1)
Basophils Relative: 1 %
Eosinophils Absolute: 0.1 10*3/uL (ref 0.0–0.5)
Eosinophils Relative: 4 %
HCT: 26.7 % — ABNORMAL LOW (ref 39.0–52.0)
Hemoglobin: 8.8 g/dL — ABNORMAL LOW (ref 13.0–17.0)
Immature Granulocytes: 4 %
Lymphocytes Relative: 27 %
Lymphs Abs: 0.6 10*3/uL — ABNORMAL LOW (ref 0.7–4.0)
MCH: 33.3 pg (ref 26.0–34.0)
MCHC: 33 g/dL (ref 30.0–36.0)
MCV: 101.1 fL — ABNORMAL HIGH (ref 80.0–100.0)
Monocytes Absolute: 0.2 10*3/uL (ref 0.1–1.0)
Monocytes Relative: 10 %
Neutro Abs: 1.2 10*3/uL — ABNORMAL LOW (ref 1.7–7.7)
Neutrophils Relative %: 54 %
Platelets: 43 10*3/uL — ABNORMAL LOW (ref 150–400)
RBC: 2.64 MIL/uL — ABNORMAL LOW (ref 4.22–5.81)
RDW: 20.6 % — ABNORMAL HIGH (ref 11.5–15.5)
WBC: 2.2 10*3/uL — ABNORMAL LOW (ref 4.0–10.5)
nRBC: 0 % (ref 0.0–0.2)

## 2021-02-07 LAB — IRON AND TIBC
Iron: 206 ug/dL — ABNORMAL HIGH (ref 45–182)
Saturation Ratios: 48 % — ABNORMAL HIGH (ref 17.9–39.5)
TIBC: 430 ug/dL (ref 250–450)
UIBC: 224 ug/dL

## 2021-02-07 LAB — FERRITIN: Ferritin: 509 ng/mL — ABNORMAL HIGH (ref 24–336)

## 2021-02-07 LAB — SAMPLE TO BLOOD BANK

## 2021-02-07 LAB — FOLATE: Folate: 14.6 ng/mL (ref >5.9)

## 2021-02-07 LAB — VITAMIN B12: Vitamin B-12: 771 pg/mL (ref 180–914)

## 2021-02-07 LAB — DAT, POLYSPECIFIC AHG (ARMC ONLY): Polyspecific AHG test: NEGATIVE

## 2021-02-07 NOTE — Progress Notes (Signed)
Subjective:    Patient ID: [name]., male    DOB: [birth year], 70 y.o.   MRN: [id number] Chief Complaint  Patient presents with  . Follow-up    4 wk Lt unna boot FU    [name] is a 70 year old male that presents today for follow-up after 4 weeks of Unna wraps.  The patient is a right above-knee amputation patient's activity is difficult for him.  However despite that the [place] wraps have been very effective for his edema.  There is no wounds or ulcerations.  The wraps have been very positive factor in his care.  Review of Systems  Cardiovascular:  Positive for leg swelling.  Musculoskeletal:  Positive for gait problem.  Neurological:  Positive for weakness.  All other systems reviewed and are negative.     Objective:   Physical Exam Vitals reviewed.  HENT:     Head: Normocephalic.  Cardiovascular:     Rate and Rhythm: Normal rate.  Pulmonary:     Effort: Pulmonary effort is normal.  Musculoskeletal:     Left lower leg: 1+ Edema present.     Right Lower Extremity: Right leg is amputated above knee.  Neurological:     Mental Status: He is alert and oriented to person, place, and time.  Psychiatric:        Mood and Affect: Mood normal.        Behavior: Behavior normal.        Thought Content: Thought content normal.        Judgment: Judgment normal.    BP (!) 146/76   Pulse 61   Ht 6' (1.829 m)   Wt 210 lb (95.3 kg)   BMI 28.48 kg/m   Past Medical History:  Diagnosis Date  . 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 ANXIETY 03/24/2007  . Chronic pain   . CIRRHOSIS 03/24/2007  . 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 FRACTURE, WRIST, LEFT 10/12/2009   Qualifier: Diagnosis of  By: Loanne Drilling MD, Jacelyn Pi   . HEPATITIS C 03/24/2007  . HYPERLIPIDEMIA 09/12/2009  . Hypocalcemia 12/23/2007   Qualifier: Diagnosis of  By: Loanne Drilling MD, Jacelyn Pi   . Pancytopenia 12/23/2007  . 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 Squamous  cell carcinoma of skin 05/21/2018   R mid volar forearm  . Thyroid disease   . UROLITHIASIS, HX OF 12/23/2007    Social History   Socioeconomic History  . Marital status: Widowed    Spouse name: Not on file  . Number of children: Not on file  . Years of education: Not on file  . Highest education level: Not on file  Occupational History  . Occupation: Disabled  Tobacco Use  . Smoking status: Never  . Smokeless tobacco: Never  Substance and Sexual Activity  . Alcohol use: Not Currently    Alcohol/week: 0.0 standard drinks  . Drug use: No  . Sexual activity: Not on file  Other Topics Concern  . Not on file  Social History Narrative   Widowed 1997   Lives alone   Lost a son in his arms in 1978.    3 sons   Social Determinants of Health   Financial Resource Strain: Low Risk   . Difficulty of Paying Living Expenses: Not hard at all  Food Insecurity: No Food Insecurity  . Worried About Charity fundraiser in the Last Year: Never true  .  Ran Out of Food in the Last Year: Never true  Transportation Needs: No Transportation Needs  . Lack of Transportation (Medical): No  . Lack of Transportation (Non-Medical): No  Physical Activity: Not on file  Stress: No Stress Concern Present  . Feeling of Stress : Not at all  Social Connections: Unknown  . Frequency of Communication with Friends and Family: More than three times a week  . Frequency of Social Gatherings with Friends and Family: More than three times a week  . Attends Religious Services: Not on file  . Active Member of Clubs or Organizations: Not on file  . Attends Archivist Meetings: Not on file  . Marital Status: Widowed  Intimate Partner Violence: Not At Risk  . Fear of Current or Ex-Partner: No  . Emotionally Abused: No  . Physically Abused: No  . Sexually Abused: No    Past Surgical History:  Procedure Laterality Date  . ABOVE KNEE LEG AMPUTATION Right    Right femur fracture, 11/1994, resulted in  right AKA 2006 after staph infection  . COLONOSCOPY N/A 12/16/2020   Procedure: COLONOSCOPY;  Surgeon: Virgel Manifold, MD;  Location: [facility] ENDOSCOPY;  Service: Endoscopy;  Laterality: N/A;  . ESOPHAGOGASTRODUODENOSCOPY N/A 12/16/2020   Procedure: ESOPHAGOGASTRODUODENOSCOPY (EGD);  Surgeon: Virgel Manifold, MD;  Location: [facility] ENDOSCOPY;  Service: Endoscopy;  Laterality: N/A;    Family History  Problem Relation Age of Onset  . Heart disease Mother   . Lung cancer Mother 32  . Heart attack Paternal Grandmother   . Heart attack Paternal Grandfather   . Liver cancer Neg Hx     Allergies  Allergen Reactions  . Clindamycin Dermatitis    CBC Latest Ref Rng & Units 02/03/2021 01/11/2021 12/28/2020  WBC 4.0 - 10.5 K/uL 1.9(L) 2.7(L) 2.6(L)  Hemoglobin 13.0 - 17.0 g/dL 7.5(L) 9.1(L) 8.3 Repeated and verified X2.(L)  Hematocrit 39.0 - 52.0 % 22.4(L) 26.5(L) 24.4 Repeated and verified X2.(L)  Platelets 150 - 400 K/uL 35(L) 44(L) 36.0 Repeated and verified X2.(LL)      CMP     Component Value Date/Time   NA 135 02/03/2021 1037   K 3.6 02/03/2021 1037   CL 100 02/03/2021 1037   CO2 27 02/03/2021 1037   GLUCOSE 153 (H) 02/03/2021 1037   BUN 11 02/03/2021 1037   CREATININE 0.71 02/03/2021 1037   CALCIUM 9.0 02/03/2021 1037   CALCIUM 9.8 02/26/2012 1428   PROT 7.1 02/03/2021 1037   ALBUMIN 3.8 02/03/2021 1037   AST 17 02/03/2021 1037   ALT 13 02/03/2021 1037   ALKPHOS 59 02/03/2021 1037   BILITOT 2.2 (H) 02/03/2021 1037   GFRNONAA >60 02/03/2021 1037   GFRAA >60 04/06/2020 1507     No results found.     Assessment & Plan:   1. Left leg swelling I have had a long discussion with the patient regarding swelling and why it  causes symptoms.  The patient will come out of Unna wraps and progressed to compression stockings.  Patient will begin wearing graduated compression stockings class 1 (20-30 mmHg) on a daily basis a prescription was given. The patient will  beginning  wearing the stockings first thing in the morning and removing them in the evening. The patient is instructed specifically not to sleep in the stockings.   In addition, behavioral modification will be initiated.  This will include frequent elevation.   Consideration for a lymph pump will also be made based upon the effectiveness of conservative  therapy.  This would help to improve the edema control and prevent sequela such as ulcers and infections   She will return in 3 months to evaluate progression of swelling 2. Primary hypertension Continue antihypertensive medications as already ordered, these medications have been reviewed and there are no changes at this time.   3. Dyslipidemia Continue statin as ordered and reviewed, no changes at this time    Current Outpatient Medications on File Prior to Visit  Medication Sig Dispense Refill  . ALPRAZolam (XANAX) 1 MG tablet Take 1 mg by mouth 3 (three) times daily.    . Cholecalciferol (VITAMIN D3) 10 MCG (400 UNIT) CAPS Take 4 capsules by mouth daily.    . folic acid (FOLVITE) 1 MG tablet TAKE 1 TABLET(1 MG) BY MOUTH DAILY 30 tablet 3  . levothyroxine (SYNTHROID) 50 MCG tablet TAKE 1 TABLET(50 MCG) BY MOUTH DAILY BEFORE BREAKFAST 90 tablet 1  . losartan (COZAAR) 100 MG tablet TAKE 1 TABLET(100 MG) BY MOUTH DAILY 90 tablet 1  . lubiprostone (AMITIZA) 24 MCG capsule Take 24 mcg by mouth daily as needed.    . meloxicam (MOBIC) 15 MG tablet Take 15 mg by mouth every morning.    Marland Kitchen morphine (MS CONTIN) 15 MG 12 hr tablet Take 15 mg by mouth 2 (two) times daily as needed.    . Na Sulfate-K Sulfate-Mg Sulf (SUPREP BOWEL PREP KIT) 17.5-3.13-1.6 GM/177ML SOLN Take 1 kit by mouth as directed. 354 mL 0  . NARCAN 4 MG/0.1ML LIQD nasal spray kit SPRAY 0.1 ML (4 MG) IN 1 NOSTRIL/ MAY REPEAT DOSE EVERY 2 3 MINUTES AS NEEDED ALTERNATING NOSTRILS    . oxycodone (ROXICODONE) 30 MG immediate release tablet Take 1 tablet by mouth 5 (five) times daily as needed.     . propranolol (INDERAL) 40 MG tablet TAKE 1 TABLET(40 MG) BY MOUTH TWICE DAILY 180 tablet 1  . vitamin B-12 (CYANOCOBALAMIN) 1000 MCG tablet Take 1 tablet (1,000 mcg total) by mouth daily.    . pantoprazole (PROTONIX) 40 MG tablet Take 1 tablet (40 mg total) by mouth daily. 30 tablet 0   No current facility-administered medications on file prior to visit.    There are no Patient Instructions on file for this visit. No follow-ups on file.   Kris Hartmann, NP

## 2021-02-08 ENCOUNTER — Telehealth: Payer: Self-pay | Admitting: *Deleted

## 2021-02-08 ENCOUNTER — Inpatient Hospital Stay: Payer: Medicare Other

## 2021-02-08 ENCOUNTER — Other Ambulatory Visit: Payer: Self-pay | Admitting: *Deleted

## 2021-02-08 NOTE — Telephone Encounter (Signed)
Called pt and they have an opening for bone marrow bx tom. At 7:30 arrival and will do the bx 8:30, NP after midnight, must have a driver. Pt agreeable . He has to call his brother and see if he can pick him up and then the pt. Will come back the next day and get his car. Pt was given my direct number so he can call and let me know if the brother would pick him up. Otherwise the appt will not work for him

## 2021-02-08 NOTE — Progress Notes (Signed)
Hbg 8.8. No blood tranfusion needed to day. Patient aware and escorted out via wheelchair.

## 2021-02-09 ENCOUNTER — Other Ambulatory Visit: Payer: Self-pay

## 2021-02-09 ENCOUNTER — Other Ambulatory Visit: Payer: Self-pay | Admitting: Student

## 2021-02-09 ENCOUNTER — Ambulatory Visit
Admission: RE | Admit: 2021-02-09 | Discharge: 2021-02-09 | Disposition: A | Discharge status: Home / Self Care | Payer: Medicare Other | Source: Ambulatory Visit | Attending: Oncology | Admitting: Oncology

## 2021-02-09 DIAGNOSIS — D61818 Other pancytopenia: Secondary | ICD-10-CM | POA: Insufficient documentation

## 2021-02-09 LAB — CBC WITH DIFFERENTIAL/PLATELET
Abs Immature Granulocytes: 0.09 10*3/uL — ABNORMAL HIGH (ref 0.00–0.07)
Basophils Absolute: 0 10*3/uL (ref 0.0–0.1)
Basophils Relative: 1 %
Eosinophils Absolute: 0.1 10*3/uL (ref 0.0–0.5)
Eosinophils Relative: 4 %
HCT: 22.3 % — ABNORMAL LOW (ref 39.0–52.0)
Hemoglobin: 7.4 g/dL — ABNORMAL LOW (ref 13.0–17.0)
Immature Granulocytes: 4 %
Lymphocytes Relative: 33 %
Lymphs Abs: 0.7 10*3/uL (ref 0.7–4.0)
MCH: 33.8 pg (ref 26.0–34.0)
MCHC: 33.2 g/dL (ref 30.0–36.0)
MCV: 101.8 fL — ABNORMAL HIGH (ref 80.0–100.0)
Monocytes Absolute: 0.2 10*3/uL (ref 0.1–1.0)
Monocytes Relative: 9 %
Neutro Abs: 1 10*3/uL — ABNORMAL LOW (ref 1.7–7.7)
Neutrophils Relative %: 49 %
Platelets: 34 10*3/uL — ABNORMAL LOW (ref 150–400)
RBC: 2.19 MIL/uL — ABNORMAL LOW (ref 4.22–5.81)
RDW: 20.7 % — ABNORMAL HIGH (ref 11.5–15.5)
WBC: 2.1 10*3/uL — ABNORMAL LOW (ref 4.0–10.5)
nRBC: 0 % (ref 0.0–0.2)

## 2021-02-09 MED ORDER — FENTANYL CITRATE (PF) 100 MCG/2ML IJ SOLN
INTRAMUSCULAR | Status: AC | PRN
  Administered 2021-02-09 (×2): 50 ug via INTRAVENOUS

## 2021-02-09 MED ORDER — MIDAZOLAM HCL 2 MG/2ML IJ SOLN
INTRAMUSCULAR | Status: AC | PRN
  Administered 2021-02-09 (×2): 1 mg via INTRAVENOUS

## 2021-02-09 MED ORDER — HEPARIN SOD (PORK) LOCK FLUSH 100 UNIT/ML IV SOLN
INTRAVENOUS | Status: AC
  Filled 2021-02-09: qty 5

## 2021-02-09 MED ORDER — FENTANYL CITRATE (PF) 100 MCG/2ML IJ SOLN
INTRAMUSCULAR | Status: AC
  Filled 2021-02-09: qty 2

## 2021-02-09 MED ORDER — MIDAZOLAM HCL 2 MG/2ML IJ SOLN
INTRAMUSCULAR | Status: AC
  Filled 2021-02-09: qty 2

## 2021-02-09 MED ORDER — HYDROCODONE-ACETAMINOPHEN 5-325 MG PO TABS
1.0000 | ORAL_TABLET | ORAL | Status: DC | PRN
Start: 2021-02-09 — End: 2021-02-10

## 2021-02-09 MED ORDER — SODIUM CHLORIDE 0.9 % IV SOLN: INTRAVENOUS | Status: DC

## 2021-02-09 NOTE — Procedures (Signed)
  Procedure: CT bone marrow biopsy R iliac EBL:   minimal Complications:  none immediate  See full dictation in [facility] PACS.  D. Buna Cuppett MD Main # [phone] Pager  [phone]    

## 2021-02-10 LAB — SURGICAL PATHOLOGY

## 2021-02-12 IMAGING — CT CT HEAD W/O CM
3 series · 16 of 47 positions shown, 19 images · non-contrast
Comparison: June 27, 2019

CLINICAL DATA: Seizures.

EXAM:
CT HEAD WITHOUT CONTRAST
TECHNIQUE: Contiguous axial images were obtained from the base of the skull
through the vertex without intravenous contrast.

[Series 2: head wo · axial · 0.43mm/px · z∈[+116,+256]mm · 10 of 34 slices shown, 13 images]
[im 3/34  brain]
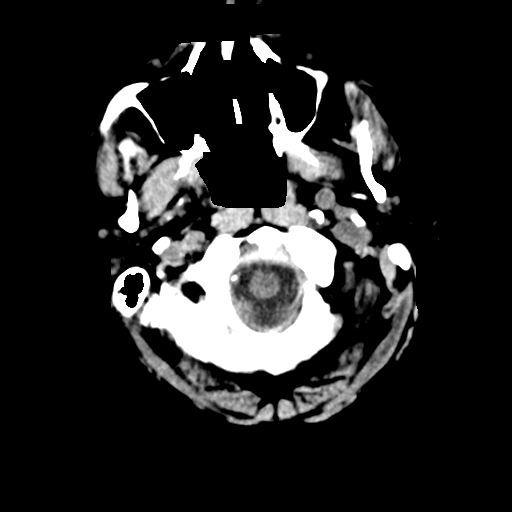
[im 3/34  bone]
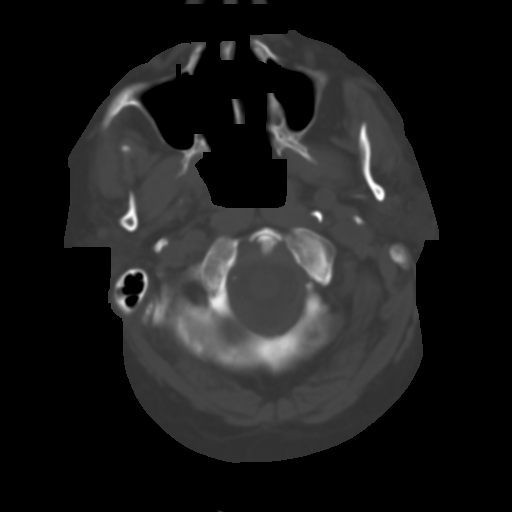
[im 6/34  brain]
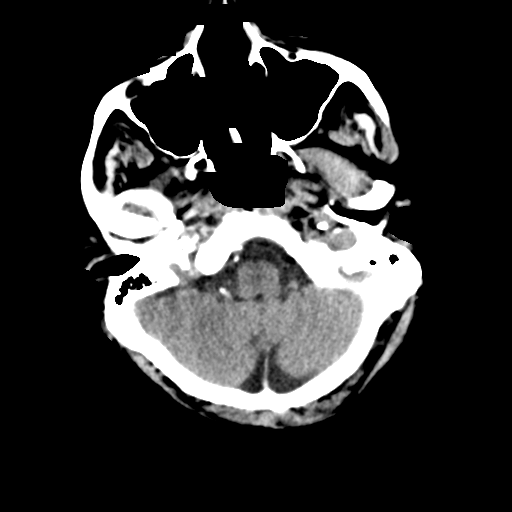
[im 10/34  brain]
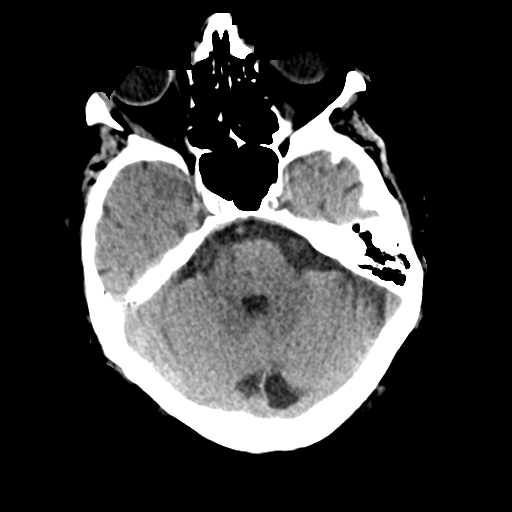
[im 12/34  brain]
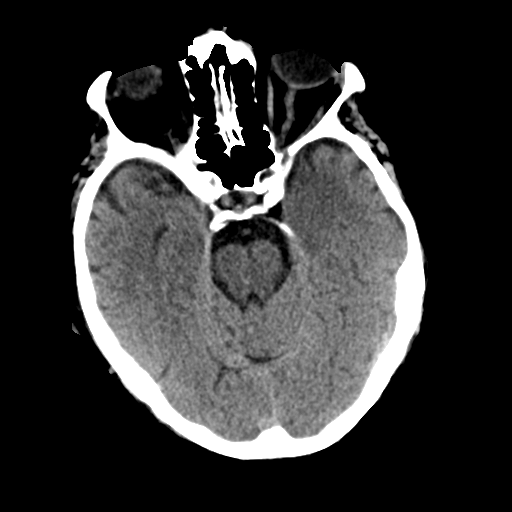
[im 15/34  brain]
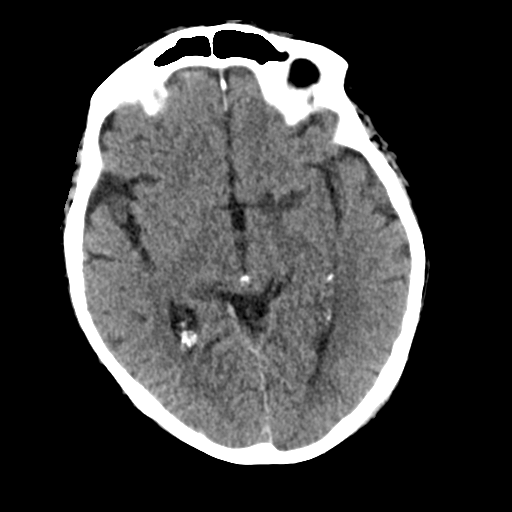
[im 15/34  bone]
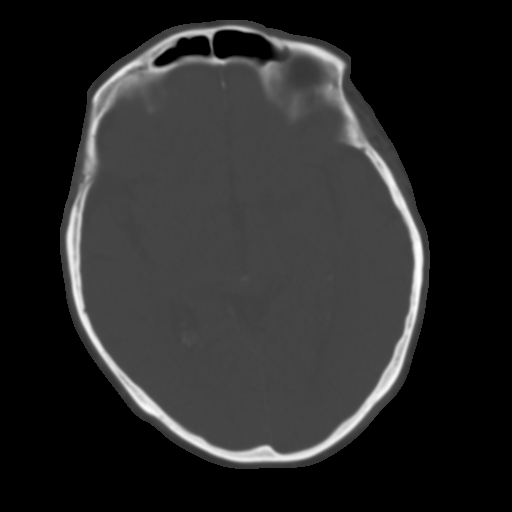
[im 19/34  brain]
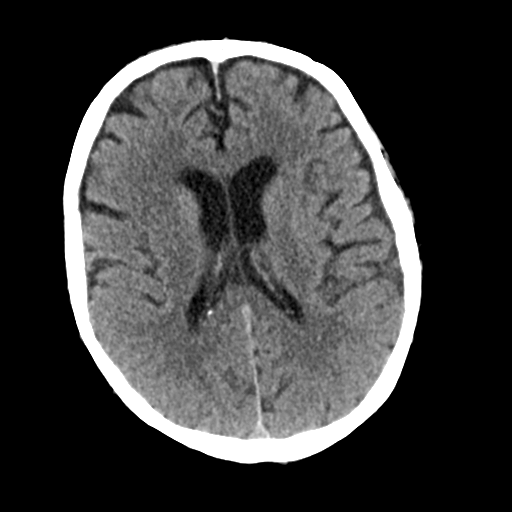
[im 22/34  brain]
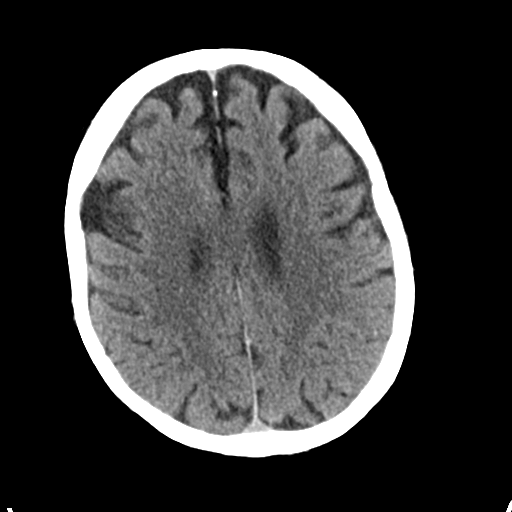
[im 26/34  brain]
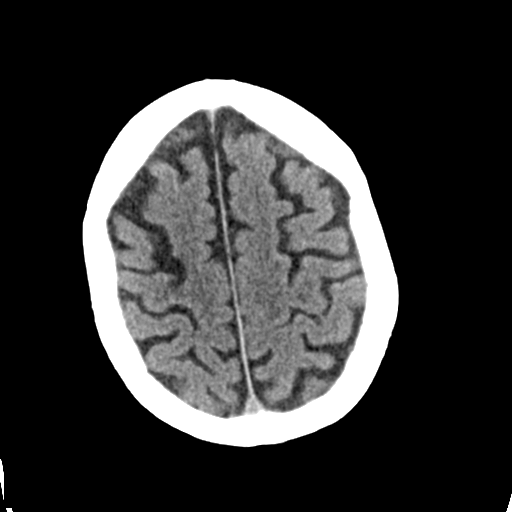
[im 28/34  brain]
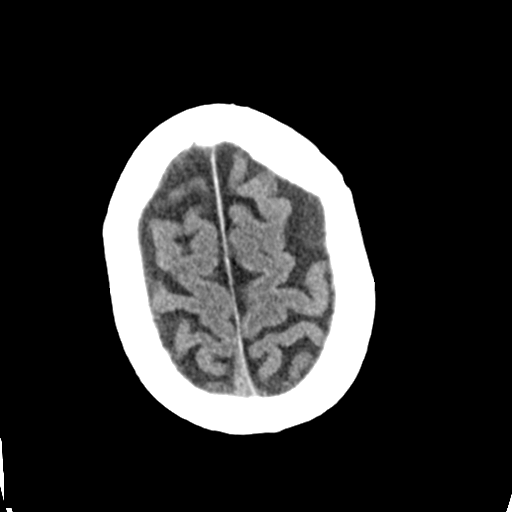
[im 28/34  bone]
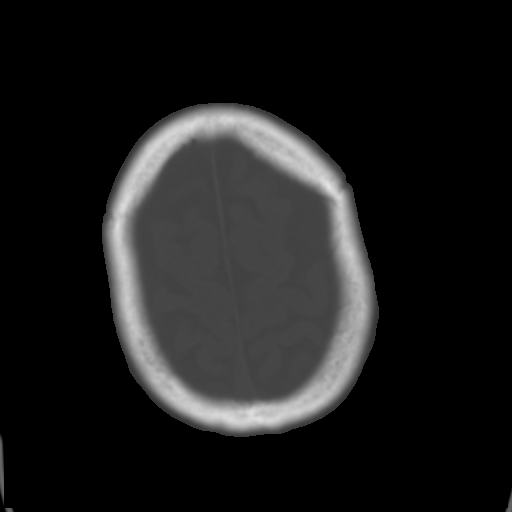
[im 31/34  brain]
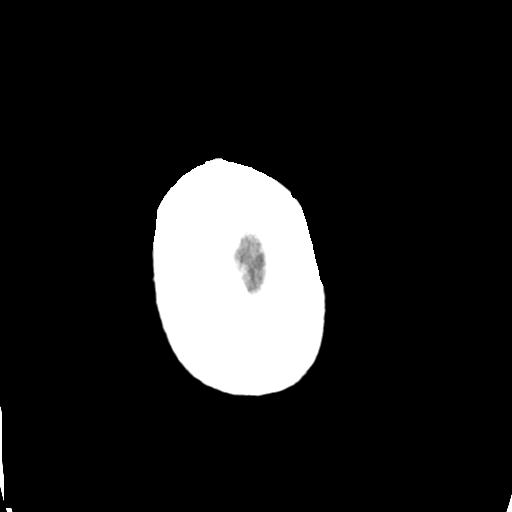

[Series 5: coronal soft tissue · coronal · 0.34mm/px · 3 of 65 slices shown]
[im 23/65  brain]
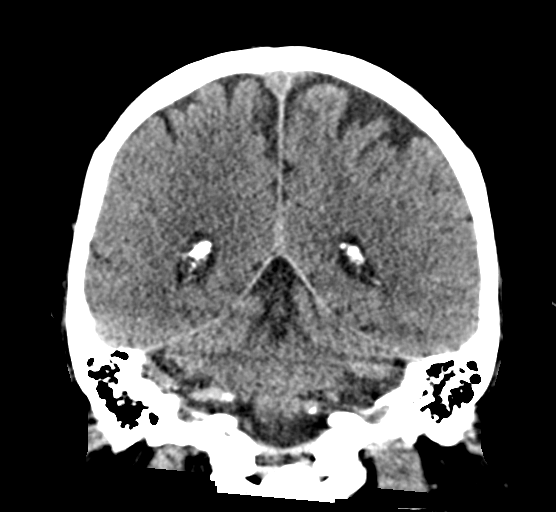
[im 29/65  brain]
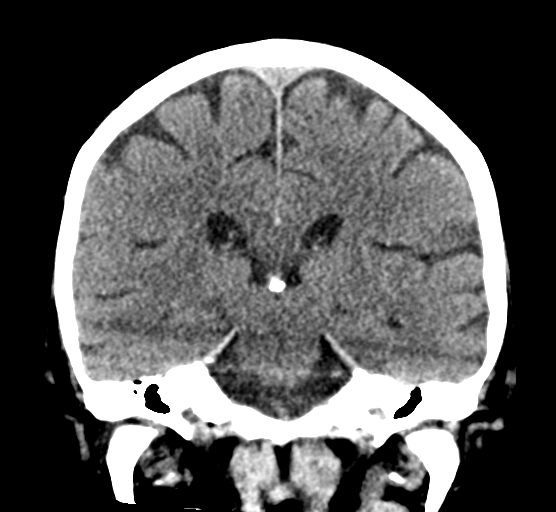
[im 36/65  brain]
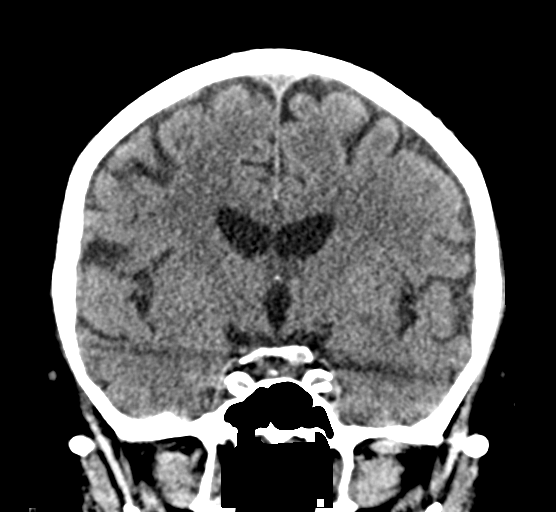

[Series 6: sagittal soft tissue · sagittal · 0.34mm/px · 3 of 58 slices shown]
[im 20/58  brain]
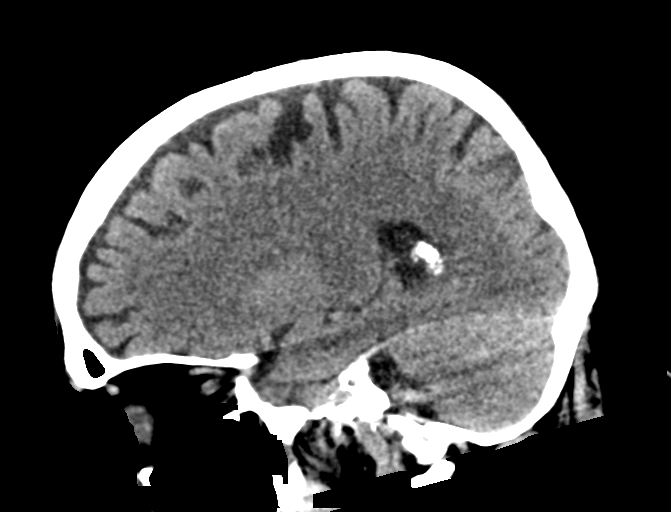
[im 29/58  brain]
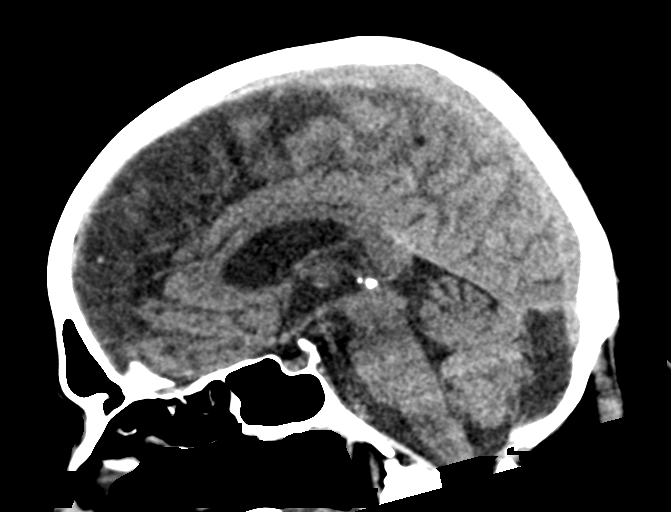
[im 39/58  brain]
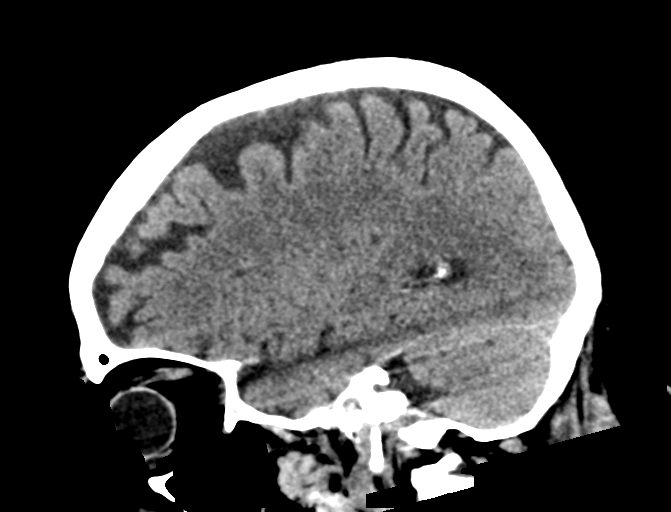

[16 of 47 positions shown; findings below may reference images not displayed]

FINDINGS: Brain: There is mild cerebral atrophy with widening of the
extra-axial spaces and ventricular dilatation.
There are areas of decreased attenuation within the white matter
tracts of the supratentorial brain, consistent with microvascular
disease changes.

Vascular: No hyperdense vessel or unexpected calcification.

Skull: Normal. Negative for fracture or focal lesion.

Sinuses/Orbits: No acute finding.

Other: None.
IMPRESSION: No acute intracranial abnormality.

## 2021-02-13 ENCOUNTER — Other Ambulatory Visit: Payer: Self-pay | Admitting: *Deleted

## 2021-02-13 DIAGNOSIS — D61818 Other pancytopenia: Secondary | ICD-10-CM

## 2021-02-15 ENCOUNTER — Encounter (HOSPITAL_COMMUNITY): Payer: Self-pay | Admitting: Oncology

## 2021-02-16 ENCOUNTER — Telehealth: Payer: Self-pay | Admitting: Family

## 2021-02-16 NOTE — Telephone Encounter (Signed)
FYI I called and spoke with Dallie Piles from [facility]. She stated that she had an open report that patient was not able to take care  of himself properly, that he was unkept & smelt of urine. She asked if we has contact info for his nurse aid Hassan Rowan. I told her that I did not & only had her name, which she already knew. Dreana tried to call the company where she is employed & was unable to reach anyone with given extension options. She said that patient also would not let her in the door. There are also reports that patient's apartment is roach infested. I told Suzzette Righter that when he has been in office he has had a strong smell of urine & has appeared unkept/un-showered. I told her I was unsure of the apartments accommodations. I let her know that he has been compliant as of late with appointments & we saw him several times for cellulitis in May/June. I let her know that he has been regularly seeing [facility], Eulogio Ditch, NP as well as Dr. Janese Banks with hematology at [facility] appears he [place] having infusions. Also saw GI end of June. I told her that patient always seems lucid when he is here, but there had been reports from [facility] that he seemed "scattered brained" at a visit. I am unsure if nurse was from [facility] or [place] by documentation? Appears that [place] is the one who came out for wound dressings. I advised that Hassan Rowan had been in once with patient & they appear to have a playful/feisty relationship here in office. Dreana did take down names of providers he has seen more recently. She stated that if she is unable to prove these accusations that she will have no choice, but to close the case. She has my name & number to call back if needed.

## 2021-02-16 NOTE — Telephone Encounter (Signed)
[place] called to talk to a nurse or Arnett in regards to a open ATS case that is going on. It is a matter about the PT care and would like to be called back at work cell: [phone] or office:[phone].

## 2021-02-17 ENCOUNTER — Encounter (HOSPITAL_COMMUNITY): Payer: Self-pay | Admitting: Oncology

## 2021-02-17 NOTE — Telephone Encounter (Signed)
I called patient & VM is set up, but all options are in spanish. I am not fluent & unsure of what is being stated as VM options. Will try back.

## 2021-02-17 NOTE — Telephone Encounter (Signed)
Noted. Please call patient and ensure he has follow-up scheduled with me.  Please notate on follow-up notes 'discuss home safety, ability to  live alone, ADLs'

## 2021-02-20 ENCOUNTER — Other Ambulatory Visit: Payer: Self-pay | Admitting: Family

## 2021-02-23 IMAGING — US US ABDOMEN COMPLETE
1 series · 13 of 25 positions shown · non-contrast
Comparison: 03/21/2005

CLINICAL DATA: Cirrhosis, assess for hepatocellular carcinoma,
splenomegaly

EXAM:
ABDOMEN ULTRASOUND COMPLETE

[Series 1: us abdomen complete · 0.26mm/px · 13 of 121 slices shown]
[im 1/121]
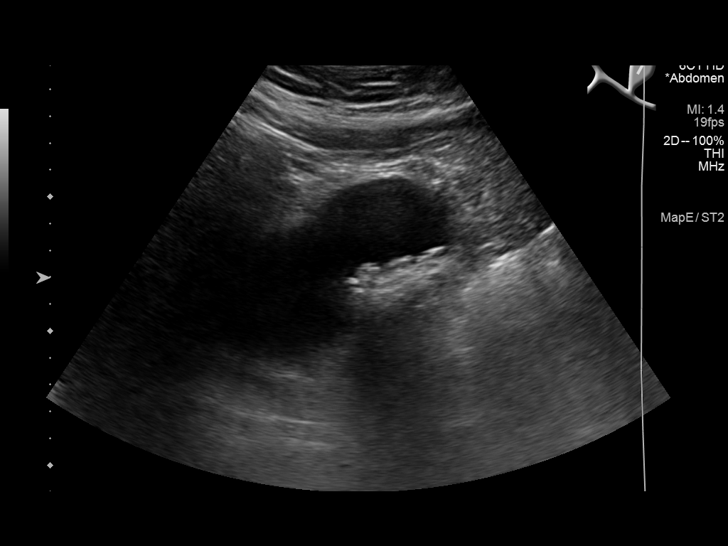
[im 11/121]
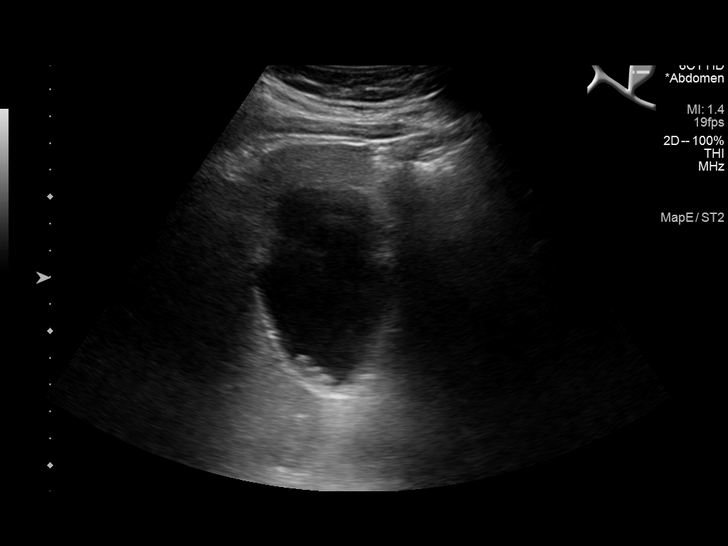
[im 21/121]
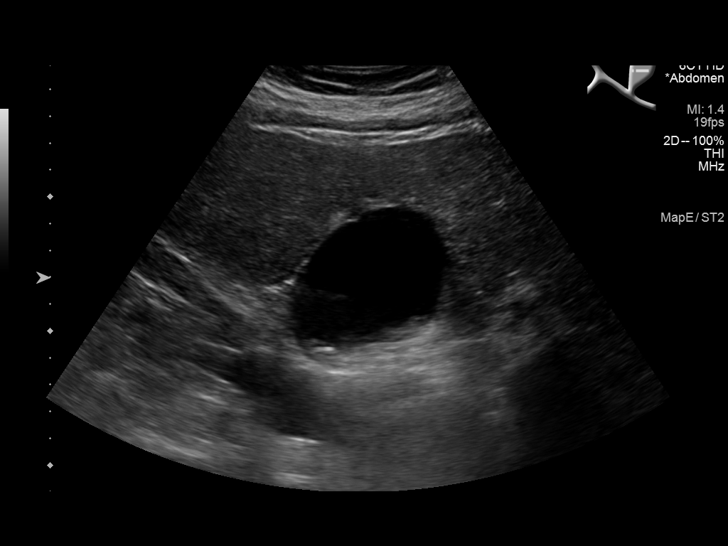
[im 31/121]
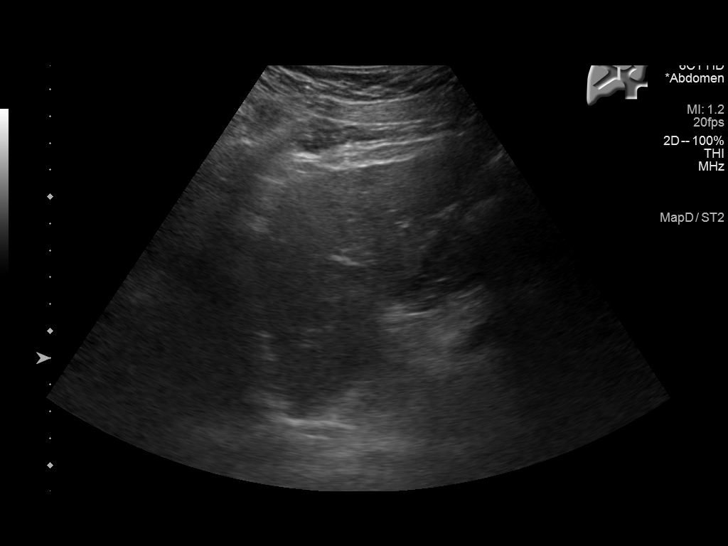
[im 41/121]
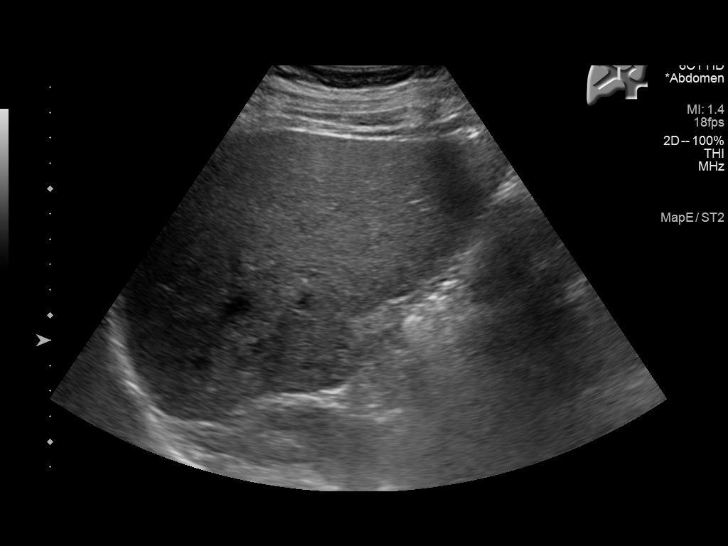
[im 51/121]
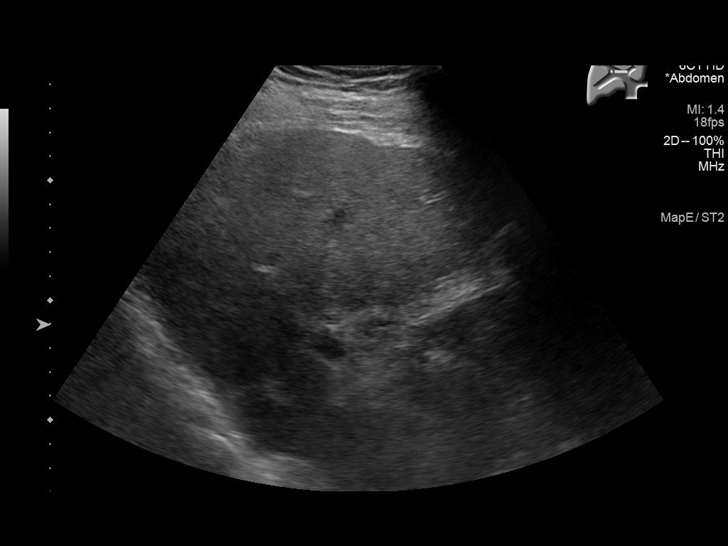
[im 61/121]
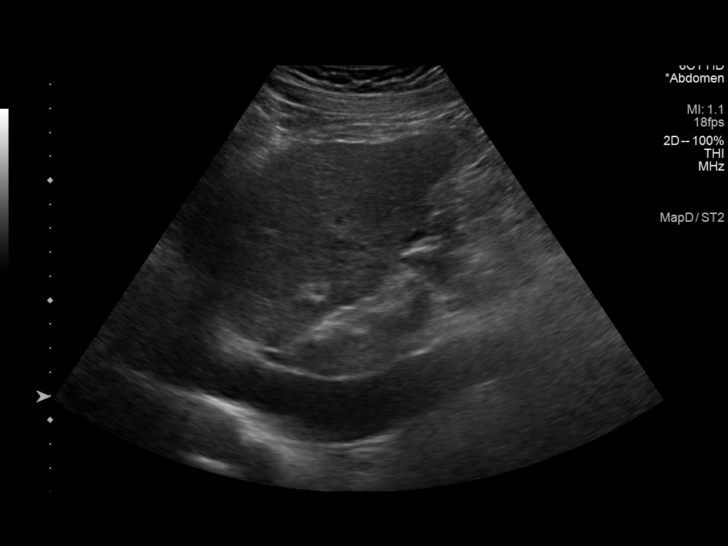
[im 71/121]
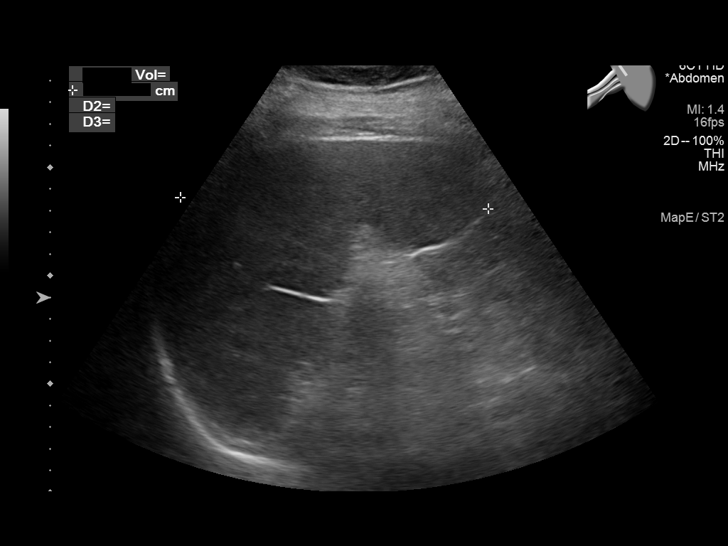
[im 81/121]
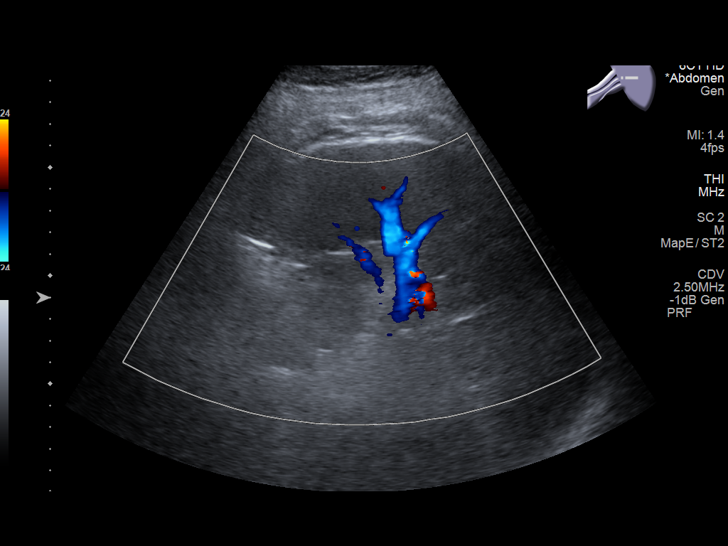
[im 91/121]
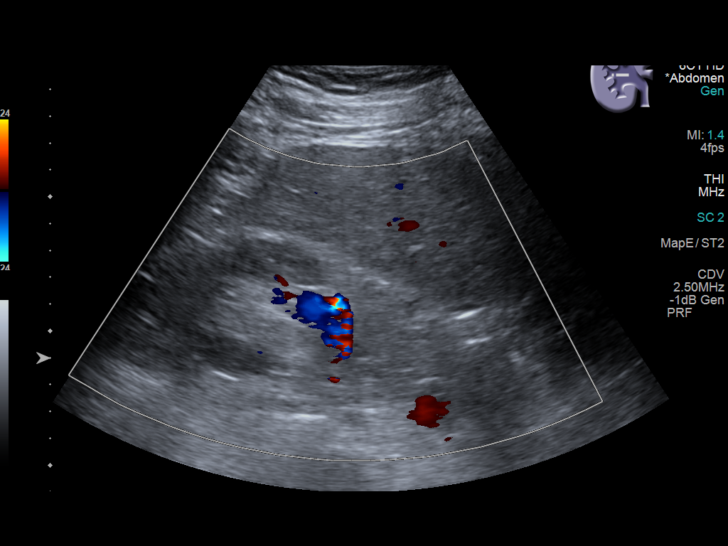
[im 101/121]
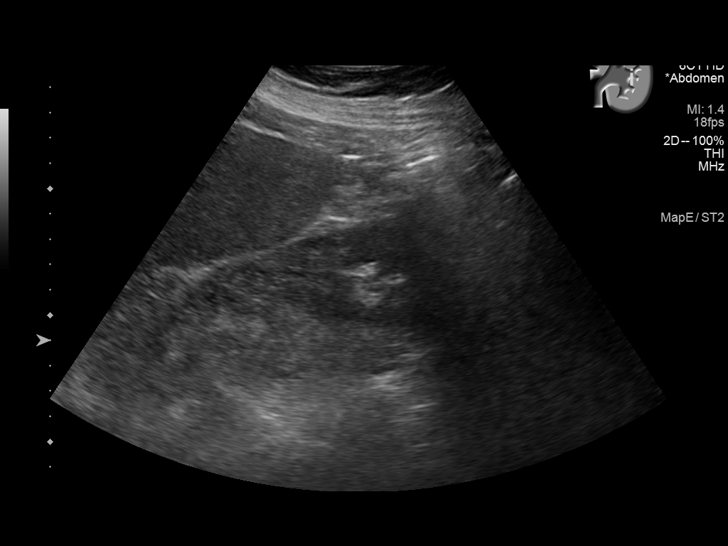
[im 111/121]
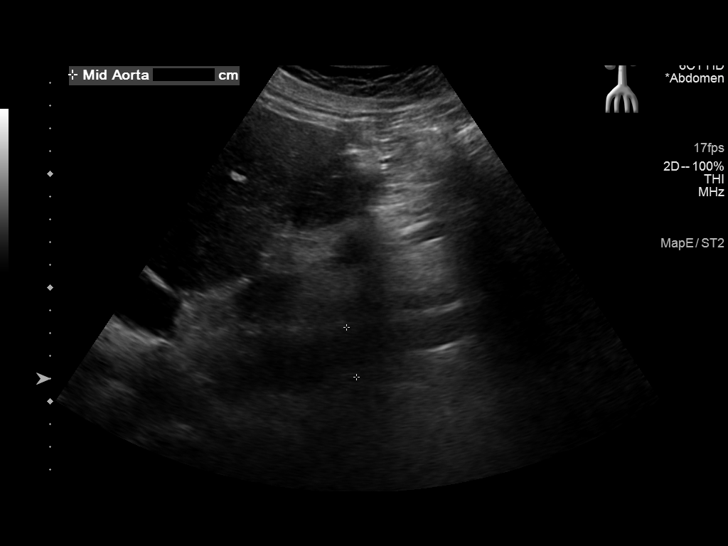
[im 121/121]
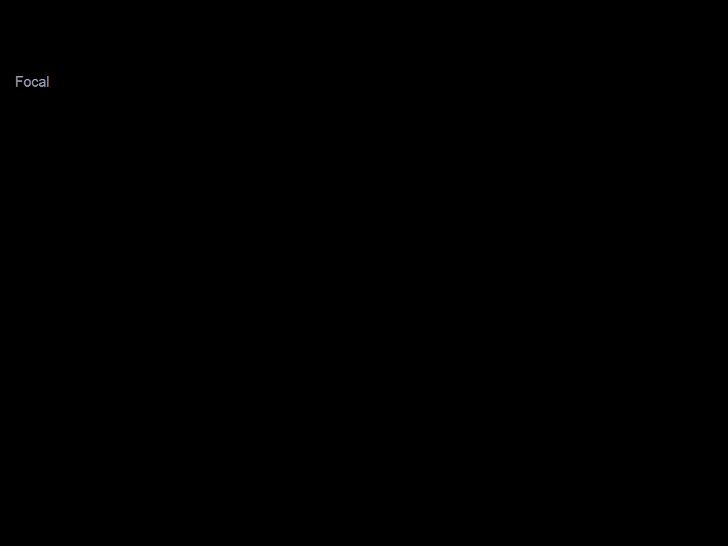

[13 of 25 positions shown; findings below may reference images not displayed]

FINDINGS: Gallbladder: Multiple shadowing calculi within gallbladder up to 8
mm diameter. Upper normal gallbladder wall thickness. Small amount
of gallbladder sludge. No pericholecystic fluid or sonographic
Murphy sign.

Common bile duct: Diameter: 5 mm, normal

Liver: Heterogeneous slightly increased echogenicity with minimally
nodular margins consistent with cirrhosis. No focal hepatic mass or
nodularity. Portal vein is patent on color Doppler imaging with
normal direction of blood flow towards the liver.

IVC: Normal appearance

Pancreas: Visualized portion of head and body normal appearance
remainder obscured by bowel gas

Spleen: 14.2 cm length with a calculated volume of 4495 mL. No focal
mass.

Right Kidney: Length: 12.3 cm. Normal morphology without mass or
hydronephrosis.

Left Kidney: Length: 13.3 cm. Normal morphology without mass or
hydronephrosis.

Abdominal aorta: Normal caliber.  Mild atherosclerotic changes.

Other findings: No free fluid.
IMPRESSION: Cirrhotic appearing liver with splenomegaly.

Gallstones and sludge within gallbladder without evidence of acute
cholecystitis.

## 2021-02-25 ENCOUNTER — Other Ambulatory Visit: Payer: Self-pay | Admitting: *Deleted

## 2021-02-25 DIAGNOSIS — D696 Thrombocytopenia, unspecified: Secondary | ICD-10-CM

## 2021-02-25 DIAGNOSIS — D61818 Other pancytopenia: Secondary | ICD-10-CM

## 2021-02-25 DIAGNOSIS — K7460 Unspecified cirrhosis of liver: Secondary | ICD-10-CM

## 2021-02-25 DIAGNOSIS — D649 Anemia, unspecified: Secondary | ICD-10-CM

## 2021-02-27 ENCOUNTER — Telehealth: Payer: Self-pay | Admitting: Oncology

## 2021-02-27 ENCOUNTER — Inpatient Hospital Stay: Payer: Medicare Other | Admitting: Oncology

## 2021-02-27 ENCOUNTER — Inpatient Hospital Stay: Payer: Medicare Other | Attending: Oncology

## 2021-02-27 DIAGNOSIS — K7460 Unspecified cirrhosis of liver: Secondary | ICD-10-CM | POA: Insufficient documentation

## 2021-02-27 DIAGNOSIS — D649 Anemia, unspecified: Secondary | ICD-10-CM | POA: Insufficient documentation

## 2021-02-27 DIAGNOSIS — D6959 Other secondary thrombocytopenia: Secondary | ICD-10-CM | POA: Insufficient documentation

## 2021-02-27 DIAGNOSIS — Z993 Dependence on wheelchair: Secondary | ICD-10-CM | POA: Insufficient documentation

## 2021-02-27 DIAGNOSIS — D72819 Decreased white blood cell count, unspecified: Secondary | ICD-10-CM | POA: Insufficient documentation

## 2021-02-27 NOTE — Telephone Encounter (Signed)
Left VM for patient to return phone call and reschedule missed appt 02/27/21.

## 2021-02-28 LAB — SURGICAL PATHOLOGY

## 2021-03-07 NOTE — Telephone Encounter (Signed)
I have patient scheduled 04/05/21 at 9:30 so that caregiver can be with patient.

## 2021-03-13 ENCOUNTER — Telehealth: Payer: Self-pay | Admitting: Family

## 2021-03-13 NOTE — Telephone Encounter (Signed)
Patient returned referrals phone call. 

## 2021-03-13 NOTE — Telephone Encounter (Signed)
Lft pt vm to call ofc to sch echo . thanks 

## 2021-03-20 ENCOUNTER — Inpatient Hospital Stay: Payer: Medicare Other

## 2021-03-20 ENCOUNTER — Encounter: Payer: Self-pay | Admitting: Oncology

## 2021-03-20 ENCOUNTER — Inpatient Hospital Stay (HOSPITAL_BASED_OUTPATIENT_CLINIC_OR_DEPARTMENT_OTHER): Payer: Medicare Other | Admitting: Oncology

## 2021-03-20 VITALS — BP 118/63 | HR 57 | Temp 97.1°F | Resp 17 | Wt 210.0 lb

## 2021-03-20 DIAGNOSIS — D649 Anemia, unspecified: Secondary | ICD-10-CM

## 2021-03-20 DIAGNOSIS — K7460 Unspecified cirrhosis of liver: Secondary | ICD-10-CM | POA: Diagnosis not present

## 2021-03-20 DIAGNOSIS — D61818 Other pancytopenia: Secondary | ICD-10-CM | POA: Diagnosis not present

## 2021-03-20 DIAGNOSIS — D696 Thrombocytopenia, unspecified: Secondary | ICD-10-CM | POA: Diagnosis not present

## 2021-03-20 DIAGNOSIS — Z993 Dependence on wheelchair: Secondary | ICD-10-CM | POA: Diagnosis not present

## 2021-03-20 DIAGNOSIS — D72819 Decreased white blood cell count, unspecified: Secondary | ICD-10-CM | POA: Diagnosis not present

## 2021-03-20 DIAGNOSIS — D6959 Other secondary thrombocytopenia: Secondary | ICD-10-CM | POA: Diagnosis not present

## 2021-03-20 LAB — CBC WITH DIFFERENTIAL/PLATELET
Abs Immature Granulocytes: 0.02 10*3/uL (ref 0.00–0.07)
Basophils Absolute: 0 10*3/uL (ref 0.0–0.1)
Basophils Relative: 1 %
Eosinophils Absolute: 0.1 10*3/uL (ref 0.0–0.5)
Eosinophils Relative: 7 %
HCT: 24 % — ABNORMAL LOW (ref 39.0–52.0)
Hemoglobin: 7.7 g/dL — ABNORMAL LOW (ref 13.0–17.0)
Immature Granulocytes: 1 %
Lymphocytes Relative: 30 %
Lymphs Abs: 0.6 10*3/uL — ABNORMAL LOW (ref 0.7–4.0)
MCH: 32.2 pg (ref 26.0–34.0)
MCHC: 32.1 g/dL (ref 30.0–36.0)
MCV: 100.4 fL — ABNORMAL HIGH (ref 80.0–100.0)
Monocytes Absolute: 0.1 10*3/uL (ref 0.1–1.0)
Monocytes Relative: 6 %
Neutro Abs: 1.1 10*3/uL — ABNORMAL LOW (ref 1.7–7.7)
Neutrophils Relative %: 55 %
Platelets: 48 10*3/uL — ABNORMAL LOW (ref 150–400)
RBC: 2.39 MIL/uL — ABNORMAL LOW (ref 4.22–5.81)
RDW: 19 % — ABNORMAL HIGH (ref 11.5–15.5)
WBC: 2 10*3/uL — ABNORMAL LOW (ref 4.0–10.5)
nRBC: 0 % (ref 0.0–0.2)

## 2021-03-20 LAB — DAT, POLYSPECIFIC AHG (ARMC ONLY): Polyspecific AHG test: NEGATIVE

## 2021-03-20 NOTE — Progress Notes (Signed)
Patient here for oncology follow-up appointment,  concerns of occasional lightheaded

## 2021-03-24 NOTE — Progress Notes (Signed)
I connected with [name] on 03/24/21 at  9:45 AM EDT by video enabled telemedicine visit and verified that I am speaking with the correct person using two identifiers.   I discussed the limitations, risks, security and privacy concerns of performing an evaluation and management service by telemedicine and the availability of in-person appointments. I also discussed with the patient that there may be a patient responsible charge related to this service. The patient expressed understanding and agreed to proceed.  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  He has seen Dr. Allen Norris for cirrhosis in the past.  He has not had any recent episodes of ascites.  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  With regards to his CBC patient has had baseline thrombocytopenia and his platelet counts mainly fluctuate between 40-60 at least since 2015.  He also has chronic leukopenia with a white count that fluctuates between 2.3-3.5 at least dating back to 2015.  Differential has mainly showed lymphopenia but at times neutropenia.  Hemoglobin has been mostly between 9.5-10.5 most recently patient was found to have a white count of 3.5, H&H of 9.7/28.8 and a platelet count of 65 and has been referred to [place] for pancytopenia.  In terms of his CMP he has had elevated bilirubin which fluctuates between 1.6-2.9 in the past but was recently noted to have a bilirubin of 3.1 mostly indirect hyperbilirubinemia with normal AST and ALT   Patient uses a motorized wheelchair for ambulation but is  independent of his ADLs and IADLs and is able to drive as well.  Appetite and weight have been stable.  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  Currently patient denies any bleeding in his stool or urine.  Denies any dark tarry stools   Results of blood work from 12/21/2019 were as follows: CBC showed white count of 6.2, H&H of 10.3/30.2 with an MCV of 96 and a platelet count of 99.  Total bilirubin was elevated at 3.1 with predominantly indirect hyperbilirubinemia.  Folic acid was normal at 6.6.  B12 levels were elevated at 1441.  Ferritin levels were elevated at 540 iron studies were normal.  Haptoglobin was less than 10.  Reticulocyte count was elevated at 10.6.  Myeloma panel showed no M protein.  LDH was mildly elevated at 256.  Coombs test was negative.  AFP was normal at 4.3.  Results of bone marrow biopsy from 02/09/2021 showed hypercellular bone marrow with erythroid proliferation.  Generally mild dyserythropoiesis.  No increase in blasts.  Suspect bone marrow changes likely secondary nature due to liver disease, medication and immune mediated process.  Early MDS is in the differential.  [facility] FISH panel for MDS did not show any evidence of abnormalities.  Cytogenetics normal.  Patient underwent EGD and colonoscopy in May 2022.  EGD showed gastric varices without bleeding and erythematous duodenopathy.  Colonoscopy showed 3 5 to 10 mm nonbleeding polyps in the descending transverse and ascending colon.  Interval history patient has baseline fatigue.  No recent  hospitalizations   Review of Systems  Constitutional:  Positive for malaise/fatigue. Negative for chills, fever and weight loss.  HENT:  Negative for congestion, ear discharge and nosebleeds.   Eyes:  Negative for blurred vision.  Respiratory:  Negative for cough, hemoptysis, sputum production, shortness of breath and wheezing.    Cardiovascular:  Negative for chest pain, palpitations, orthopnea and claudication.  Gastrointestinal:  Negative for abdominal pain, blood in stool, constipation, diarrhea, heartburn, melena, nausea and vomiting.  Genitourinary:  Negative for dysuria, flank pain, frequency, hematuria and urgency.  Musculoskeletal:  Negative for back pain, joint pain and myalgias.  Skin:  Negative for rash.  Neurological:  Negative for dizziness, tingling, focal weakness, seizures, weakness and headaches.  Endo/Heme/Allergies:  Does not bruise/bleed easily.  Psychiatric/Behavioral:  Negative for depression and suicidal ideas. The patient does not have insomnia.    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   Squamous cell carcinoma of skin 05/21/2018   R mid volar forearm   Thyroid disease    UROLITHIASIS, HX OF 12/23/2007    Past Surgical History:  Procedure Laterality Date   ABOVE KNEE LEG AMPUTATION Right    Right femur fracture, 11/1994, resulted in right AKA 2006 after staph infection   COLONOSCOPY N/A 12/16/2020   Procedure: COLONOSCOPY;  Surgeon: Virgel Manifold, MD;  Location: [facility] ENDOSCOPY;  Service: Endoscopy;  Laterality: N/A;   ESOPHAGOGASTRODUODENOSCOPY N/A 12/16/2020   Procedure: ESOPHAGOGASTRODUODENOSCOPY (EGD);  Surgeon: Virgel Manifold, MD;  Location: [facility] ENDOSCOPY;  Service: Endoscopy;  Laterality: N/A;    Social History   Socioeconomic History   Marital status:  Widowed    Spouse name: Not on file   Number of children: Not on file   Years of education: Not on file   Highest education level: Not on file  Occupational History   Occupation: Disabled  Tobacco Use   Smoking status: Never   Smokeless tobacco: Never  Substance and Sexual Activity   Alcohol use: Not Currently    Alcohol/week: 0.0 standard drinks   Drug use: No   Sexual activity: Not on file  Other Topics Concern   Not on file  Social History Narrative   Widowed 1997   Lives alone   Lost a son in his arms in 67.    3 sons   Social Determinants of Health   Financial Resource Strain: Low Risk    Difficulty of Paying Living Expenses: Not hard at all  Food Insecurity: No Food Insecurity   Worried About Charity fundraiser in the Last Year: Never true   Arboriculturist in the Last Year: Never true  Transportation Needs: No Transportation Needs   Lack of Transportation (Medical): No   Lack of Transportation (Non-Medical): No  Physical Activity: Not on file  Stress: No Stress Concern Present   Feeling of Stress : Not at all  Social Connections: Unknown  Frequency of Communication with Friends and Family: More than three times a week   Frequency of Social Gatherings with Friends and Family: More than three times a week   Attends Religious Services: Not on file   Active Member of Clubs or Organizations: Not on file   Attends Archivist Meetings: Not on file   Marital Status: Widowed  Human resources officer Violence: Not At Risk   Fear of Current or Ex-Partner: No   Emotionally Abused: No   Physically Abused: No   Sexually Abused: No    Family History  Problem Relation Age of Onset   Heart disease Mother    Lung cancer Mother 71   Heart attack Paternal Grandmother    Heart attack Paternal Grandfather    Liver cancer Neg Hx      Current Outpatient Medications:    ALPRAZolam (XANAX) 1 MG tablet, Take 1 mg by mouth 3 (three) times daily., Disp: , Rfl:     Cholecalciferol (VITAMIN D3) 10 MCG (400 UNIT) CAPS, Take 4 capsules by mouth daily., Disp: , Rfl:    folic acid (FOLVITE) 1 MG tablet, TAKE 1 TABLET(1 MG) BY MOUTH DAILY, Disp: 30 tablet, Rfl: 3   hydrochlorothiazide (HYDRODIURIL) 12.5 MG tablet, Take 12.5 mg by mouth daily., Disp: , Rfl:    levothyroxine (SYNTHROID) 50 MCG tablet, TAKE 1 TABLET(50 MCG) BY MOUTH DAILY BEFORE BREAKFAST, Disp: 90 tablet, Rfl: 1   losartan (COZAAR) 100 MG tablet, TAKE 1 TABLET(100 MG) BY MOUTH DAILY, Disp: 90 tablet, Rfl: 1   lubiprostone (AMITIZA) 24 MCG capsule, Take 24 mcg by mouth daily as needed., Disp: , Rfl:    meloxicam (MOBIC) 15 MG tablet, Take 15 mg by mouth every morning., Disp: , Rfl:    morphine (MS CONTIN) 15 MG 12 hr tablet, Take 15 mg by mouth 2 (two) times daily as needed., Disp: , Rfl:    NARCAN 4 MG/0.1ML LIQD nasal spray kit, SPRAY 0.1 ML (4 MG) IN 1 NOSTRIL/ MAY REPEAT DOSE EVERY 2 3 MINUTES AS NEEDED ALTERNATING NOSTRILS, Disp: , Rfl:    oxycodone (ROXICODONE) 30 MG immediate release tablet, Take 1 tablet by mouth 5 (five) times daily as needed., Disp: , Rfl:    pantoprazole (PROTONIX) 40 MG tablet, Take 1 tablet (40 mg total) by mouth daily., Disp: 30 tablet, Rfl: 0   propranolol (INDERAL) 40 MG tablet, TAKE 1 TABLET(40 MG) BY MOUTH TWICE DAILY, Disp: 180 tablet, Rfl: 1   vitamin B-12 (CYANOCOBALAMIN) 1000 MCG tablet, Take 1 tablet (1,000 mcg total) by mouth daily., Disp: , Rfl:    Na Sulfate-K Sulfate-Mg Sulf (SUPREP BOWEL PREP KIT) 17.5-3.13-1.6 GM/177ML SOLN, Take 1 kit by mouth as directed. (Patient not taking: Reported on 03/20/2021), Disp: 354 mL, Rfl: 0  No results found.  No images are attached to the encounter.   CMP Latest Ref Rng & Units 02/03/2021  Glucose 70 - 99 mg/dL 153(H)  BUN 8 - 23 mg/dL 11  Creatinine 0.61 - 1.24 mg/dL 0.71  Sodium 135 - 145 mmol/L 135  Potassium 3.5 - 5.1 mmol/L 3.6  Chloride 98 - 111 mmol/L 100  CO2 22 - 32 mmol/L 27  Calcium 8.9 - 10.3 mg/dL 9.0   Total Protein 6.5 - 8.1 g/dL 7.1  Total Bilirubin 0.3 - 1.2 mg/dL 2.2(H)  Alkaline Phos 38 - 126 U/L 59  AST 15 - 41 U/L 17  ALT 0 - 44 U/L 13   CBC Latest Ref Rng & Units 03/20/2021  WBC 4.0 - 10.5 K/uL 2.0(L)  Hemoglobin 13.0 - 17.0 g/dL 7.7(L)  Hematocrit 39.0 - 52.0 % 24.0(L)  Platelets 150 - 400 K/uL 48(L)     Observation/objective: Appears in no acute distress over video visit today.  Breathing is nonlabored.  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  More recently it has been between 7-8.  Infrequently he drops his hemoglobin to less than 7 requiring blood transfusion.  Coombs test remains negative he has not an extensive anemia work-up in the past which did not reveal any evidence of iron B12 deficiency.  Haptoglobin levels remain less than 10 likely indicative of extravascular nonimmune hemolysis in the setting of cirrhosis.  He will continue to remain on folate supplements.  Bone marrow biopsy did not reveal any evidence of primary myeloproliferative disorder or MDS/leukemia that would explain his anemia.  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  Follow-up instructions: CBC with differential, reticulocyte count, haptoglobin B12 and folate in 2 in 4 months and I will see him back in 4 months  I discussed the assessment and treatment plan with the patient. The patient was provided an opportunity to ask questions and all were answered. The patient agreed with the plan and demonstrated an understanding of the instructions.   The patient was advised to call back or seek an in-person evaluation if the symptoms worsen or if the condition fails to improve as  anticipated  Visit Diagnosis: 1. Pancytopenia ([place])   2. Normocytic anemia   3. Thrombocytopenia ([place])     Dr. Randa Evens, MD, MPH [facility] Tel- [phone] 03/24/2021 10:11 AM

## 2021-03-28 ENCOUNTER — Ambulatory Visit: Admit: 2021-03-28 | Payer: Medicare Other | Admitting: Gastroenterology

## 2021-03-28 SURGERY — COLONOSCOPY WITH PROPOFOL
Anesthesia: General

## 2021-04-05 ENCOUNTER — Ambulatory Visit: Payer: Medicare Other | Admitting: Family

## 2021-04-27 ENCOUNTER — Telehealth: Payer: Self-pay | Admitting: Family

## 2021-04-27 DIAGNOSIS — M7989 Other specified soft tissue disorders: Secondary | ICD-10-CM

## 2021-04-27 DIAGNOSIS — I10 Essential (primary) hypertension: Secondary | ICD-10-CM

## 2021-04-27 DIAGNOSIS — K7460 Unspecified cirrhosis of liver: Secondary | ICD-10-CM

## 2021-04-27 NOTE — Telephone Encounter (Signed)
-----   Message from Ashley Jacobs sent at 04/20/2021 10:40 AM EDT ----- On 03/14/2021 I spoke with pt he stated wants to wait.  ----- Message ----- From: [name], FNP Sent: 04/20/2021   9:49 AM EDT To: Constance Haw YoungBlood  What is status echocardiogram?  I Ordered in May

## 2021-04-27 NOTE — Telephone Encounter (Signed)
I called patient & he apologized for missing his appointment. He was rescheduled here 11/4 at 10. His caregiver Hassan Rowan is with him week days 9-12p. He also stated that he did want to get echocardiogram rescheduled. Can this be done?

## 2021-04-27 NOTE — Telephone Encounter (Signed)
He told me today that he wasn't doing the best & he knew needed to f/iu with tests as well doctors appointments.

## 2021-04-27 NOTE — Telephone Encounter (Signed)
Call pt He never had echocardiogram in setting of leg swelling  Please make f/u to discuss leg swelling as well as follow up in general

## 2021-04-28 ENCOUNTER — Telehealth: Payer: Self-pay | Admitting: Family

## 2021-04-28 NOTE — Telephone Encounter (Signed)
New echo ordered 

## 2021-04-28 NOTE — Telephone Encounter (Signed)
Lft pt vm to call ofc to sch echo . thanks 

## 2021-05-09 ENCOUNTER — Ambulatory Visit (INDEPENDENT_AMBULATORY_CARE_PROVIDER_SITE_OTHER): Payer: Medicare Other | Admitting: Nurse Practitioner

## 2021-05-09 ENCOUNTER — Encounter (INDEPENDENT_AMBULATORY_CARE_PROVIDER_SITE_OTHER): Payer: Self-pay | Admitting: Nurse Practitioner

## 2021-05-11 ENCOUNTER — Ambulatory Visit: Payer: Medicare Other

## 2021-05-15 ENCOUNTER — Other Ambulatory Visit: Payer: Self-pay | Admitting: Oncology

## 2021-05-15 ENCOUNTER — Other Ambulatory Visit: Payer: Self-pay | Admitting: Family

## 2021-05-15 DIAGNOSIS — I10 Essential (primary) hypertension: Secondary | ICD-10-CM

## 2021-05-22 ENCOUNTER — Other Ambulatory Visit: Payer: Self-pay

## 2021-05-22 ENCOUNTER — Inpatient Hospital Stay: Payer: Medicare Other | Attending: Oncology

## 2021-05-22 DIAGNOSIS — D649 Anemia, unspecified: Secondary | ICD-10-CM | POA: Diagnosis present

## 2021-05-22 DIAGNOSIS — D696 Thrombocytopenia, unspecified: Secondary | ICD-10-CM | POA: Insufficient documentation

## 2021-05-22 DIAGNOSIS — D594 Other nonautoimmune hemolytic anemias: Secondary | ICD-10-CM

## 2021-05-22 DIAGNOSIS — D61818 Other pancytopenia: Secondary | ICD-10-CM

## 2021-05-22 LAB — VITAMIN B12: Vitamin B-12: 625 pg/mL (ref 180–914)

## 2021-05-22 LAB — COMPREHENSIVE METABOLIC PANEL
ALT: 14 U/L (ref 0–44)
AST: 17 U/L (ref 15–41)
Albumin: 3.9 g/dL (ref 3.5–5.0)
Alkaline Phosphatase: 51 U/L (ref 38–126)
Anion gap: 7 (ref 5–15)
BUN: 15 mg/dL (ref 8–23)
CO2: 27 mmol/L (ref 22–32)
Calcium: 8.9 mg/dL (ref 8.9–10.3)
Chloride: 100 mmol/L (ref 98–111)
Creatinine, Ser: 0.54 mg/dL — ABNORMAL LOW (ref 0.61–1.24)
GFR, Estimated: >60 mL/min (ref >60)
Glucose, Bld: 159 mg/dL — ABNORMAL HIGH (ref 70–99)
Potassium: 3.6 mmol/L (ref 3.5–5.1)
Sodium: 134 mmol/L — ABNORMAL LOW (ref 135–145)
Total Bilirubin: 3.6 mg/dL — ABNORMAL HIGH (ref 0.3–1.2)
Total Protein: 7.2 g/dL (ref 6.5–8.1)

## 2021-05-22 LAB — CBC WITH DIFFERENTIAL/PLATELET
Abs Immature Granulocytes: 0.01 10*3/uL (ref 0.00–0.07)
Basophils Absolute: 0 10*3/uL (ref 0.0–0.1)
Basophils Relative: 1 %
Eosinophils Absolute: 0.1 10*3/uL (ref 0.0–0.5)
Eosinophils Relative: 3 %
HCT: 19.8 % — ABNORMAL LOW (ref 39.0–52.0)
Hemoglobin: 6.6 g/dL — ABNORMAL LOW (ref 13.0–17.0)
Immature Granulocytes: 1 %
Lymphocytes Relative: 18 %
Lymphs Abs: 0.4 10*3/uL — ABNORMAL LOW (ref 0.7–4.0)
MCH: 33.8 pg (ref 26.0–34.0)
MCHC: 33.3 g/dL (ref 30.0–36.0)
MCV: 101.5 fL — ABNORMAL HIGH (ref 80.0–100.0)
Monocytes Absolute: 0.1 10*3/uL (ref 0.1–1.0)
Monocytes Relative: 6 %
Neutro Abs: 1.5 10*3/uL — ABNORMAL LOW (ref 1.7–7.7)
Neutrophils Relative %: 71 %
Platelets: 32 10*3/uL — ABNORMAL LOW (ref 150–400)
RBC: 1.95 MIL/uL — ABNORMAL LOW (ref 4.22–5.81)
RDW: 18.4 % — ABNORMAL HIGH (ref 11.5–15.5)
Smear Review: NORMAL
WBC: 2 10*3/uL — ABNORMAL LOW (ref 4.0–10.5)
nRBC: 0 % (ref 0.0–0.2)

## 2021-05-22 LAB — RETICULOCYTES
Immature Retic Fract: 22.2 % — ABNORMAL HIGH (ref 2.3–15.9)
RBC.: 1.92 MIL/uL — ABNORMAL LOW (ref 4.22–5.81)
Retic Count, Absolute: 156.7 10*3/uL (ref 19.0–186.0)
Retic Ct Pct: 8.2 % — ABNORMAL HIGH (ref 0.4–3.1)

## 2021-05-22 LAB — FOLATE: Folate: 27 ng/mL (ref >5.9)

## 2021-05-23 LAB — HAPTOGLOBIN: Haptoglobin: 27 mg/dL — ABNORMAL LOW (ref 34–355)

## 2021-05-24 ENCOUNTER — Inpatient Hospital Stay: Payer: Medicare Other

## 2021-05-24 ENCOUNTER — Other Ambulatory Visit: Payer: Self-pay | Admitting: Oncology

## 2021-05-24 ENCOUNTER — Other Ambulatory Visit: Payer: Self-pay

## 2021-05-24 DIAGNOSIS — D649 Anemia, unspecified: Secondary | ICD-10-CM

## 2021-05-24 DIAGNOSIS — D696 Thrombocytopenia, unspecified: Secondary | ICD-10-CM

## 2021-05-24 DIAGNOSIS — D61818 Other pancytopenia: Secondary | ICD-10-CM

## 2021-05-24 LAB — IRON AND TIBC
Iron: 123 ug/dL (ref 45–182)
Saturation Ratios: 33 % (ref 17.9–39.5)
TIBC: 374 ug/dL (ref 250–450)
UIBC: 251 ug/dL

## 2021-05-24 LAB — CBC WITH DIFFERENTIAL/PLATELET
Abs Immature Granulocytes: 0.02 10*3/uL (ref 0.00–0.07)
Basophils Absolute: 0 10*3/uL (ref 0.0–0.1)
Basophils Relative: 1 %
Eosinophils Absolute: 0.1 10*3/uL (ref 0.0–0.5)
Eosinophils Relative: 4 %
HCT: 20.7 % — ABNORMAL LOW (ref 39.0–52.0)
Hemoglobin: 6.7 g/dL — ABNORMAL LOW (ref 13.0–17.0)
Immature Granulocytes: 1 %
Lymphocytes Relative: 35 %
Lymphs Abs: 0.5 10*3/uL — ABNORMAL LOW (ref 0.7–4.0)
MCH: 34 pg (ref 26.0–34.0)
MCHC: 32.4 g/dL (ref 30.0–36.0)
MCV: 105.1 fL — ABNORMAL HIGH (ref 80.0–100.0)
Monocytes Absolute: 0.1 10*3/uL (ref 0.1–1.0)
Monocytes Relative: 7 %
Neutro Abs: 0.8 10*3/uL — ABNORMAL LOW (ref 1.7–7.7)
Neutrophils Relative %: 52 %
Platelets: 35 10*3/uL — ABNORMAL LOW (ref 150–400)
RBC: 1.97 MIL/uL — ABNORMAL LOW (ref 4.22–5.81)
RDW: 18.3 % — ABNORMAL HIGH (ref 11.5–15.5)
Smear Review: NORMAL
WBC: 1.5 10*3/uL — ABNORMAL LOW (ref 4.0–10.5)
nRBC: 0 % (ref 0.0–0.2)

## 2021-05-24 LAB — RETICULOCYTES
Immature Retic Fract: 20.5 % — ABNORMAL HIGH (ref 2.3–15.9)
RBC.: 1.93 MIL/uL — ABNORMAL LOW (ref 4.22–5.81)
Retic Count, Absolute: 160.2 10*3/uL (ref 19.0–186.0)
Retic Ct Pct: 8.3 % — ABNORMAL HIGH (ref 0.4–3.1)

## 2021-05-24 LAB — FOLATE: Folate: 25 ng/mL (ref >5.9)

## 2021-05-24 LAB — SAMPLE TO BLOOD BANK

## 2021-05-25 ENCOUNTER — Other Ambulatory Visit: Payer: Medicare Other

## 2021-05-25 ENCOUNTER — Inpatient Hospital Stay: Payer: Medicare Other

## 2021-05-25 DIAGNOSIS — D649 Anemia, unspecified: Secondary | ICD-10-CM

## 2021-05-25 LAB — PREPARE RBC (CROSSMATCH)

## 2021-05-25 LAB — HAPTOGLOBIN: Haptoglobin: 30 mg/dL — ABNORMAL LOW (ref 34–355)

## 2021-05-25 MED ORDER — SODIUM CHLORIDE 0.9% IV SOLUTION
250.0000 mL | Freq: Once | INTRAVENOUS | Status: AC
  Administered 2021-05-25: 250 mL via INTRAVENOUS
  Filled 2021-05-25: qty 250

## 2021-05-25 MED ORDER — ACETAMINOPHEN 325 MG PO TABS
650.0000 mg | ORAL_TABLET | Freq: Once | ORAL | Status: AC
  Administered 2021-05-25: 650 mg via ORAL
  Filled 2021-05-25: qty 2

## 2021-05-25 NOTE — Patient Instructions (Signed)
[facility]   Discharge Instructions: Thank you for choosing [facility] to provide your oncology and hematology care.  If you have a lab appointment with the [facility], please go directly to the [facility] and check in at the registration area.  We strive to give you quality time with your provider. You may need to reschedule your appointment if you arrive late (15 or more minutes).  Arriving late affects you and other patients whose appointments are after yours.  Also, if you miss three or more appointments without notifying the office, you may be dismissed from the clinic at the provider's discretion.      For prescription refill requests, have your pharmacy contact our office and allow 72 hours for refills to be completed.    Today you received the following: Blood transfusion.      To help prevent nausea and vomiting after your treatment, we encourage you to take your nausea medication as directed.  BELOW ARE SYMPTOMS THAT SHOULD BE REPORTED IMMEDIATELY: *FEVER GREATER THAN 100.4 F (38 C) OR HIGHER *CHILLS OR SWEATING *NAUSEA AND VOMITING THAT IS NOT CONTROLLED WITH YOUR NAUSEA MEDICATION *UNUSUAL SHORTNESS OF BREATH *UNUSUAL BRUISING OR BLEEDING *URINARY PROBLEMS (pain or burning when urinating, or frequent urination) *BOWEL PROBLEMS (unusual diarrhea, constipation, pain near the anus) TENDERNESS IN MOUTH AND THROAT WITH OR WITHOUT PRESENCE OF ULCERS (sore throat, sores in mouth, or a toothache) UNUSUAL RASH, SWELLING OR PAIN  UNUSUAL VAGINAL DISCHARGE OR ITCHING   Items with * indicate a potential emergency and should be followed up as soon as possible or go to the Emergency Department if any problems should occur.  Please show the CHEMOTHERAPY ALERT CARD or IMMUNOTHERAPY ALERT CARD at check-in to the Emergency Department and triage nurse.  Should you have questions after your visit or need to cancel or reschedule your  appointment, please contact [facility]  [phone] and follow the prompts.  Office hours are 8:00 a.m. to 4:30 p.m. Monday - Friday. Please note that voicemails left after 4:00 p.m. may not be returned until the following business day.  We are closed weekends and major holidays. You have access to a nurse at all times for urgent questions. Please call the main number to the clinic [phone] and follow the prompts.  For any non-urgent questions, you may also contact your provider using MyChart. We now offer e-Visits for anyone 39 and older to request care online for non-urgent symptoms. For details visit [email or web address].   Also download the MyChart app! Go to the app store, search "MyChart", open the app, select [facility], and log in with your MyChart username and password.  Due to Covid, a mask is required upon entering the hospital/clinic. If you do not have a mask, one will be given to you upon arrival. For doctor visits, patients may have 1 support person aged 109 or older with them. For treatment visits, patients cannot have anyone with them due to current Covid guidelines and our immunocompromised population.

## 2021-05-29 LAB — TYPE AND SCREEN
ABO/RH(D): B NEG
Antibody Screen: POSITIVE
Unit division: 0

## 2021-05-29 LAB — BPAM RBC
Blood Product Expiration Date: 202211262359
ISSUE DATE / TIME: 202210271310
Unit Type and Rh: 9500

## 2021-06-02 ENCOUNTER — Other Ambulatory Visit: Payer: Self-pay

## 2021-06-02 ENCOUNTER — Ambulatory Visit (INDEPENDENT_AMBULATORY_CARE_PROVIDER_SITE_OTHER): Payer: Medicare Other | Admitting: Family

## 2021-06-02 ENCOUNTER — Encounter: Payer: Self-pay | Admitting: Family

## 2021-06-02 VITALS — BP 140/62 | HR 71 | Temp 98.6°F | Ht 72.0 in

## 2021-06-02 DIAGNOSIS — D649 Anemia, unspecified: Secondary | ICD-10-CM | POA: Diagnosis not present

## 2021-06-02 DIAGNOSIS — Z23 Encounter for immunization: Secondary | ICD-10-CM

## 2021-06-02 DIAGNOSIS — M7989 Other specified soft tissue disorders: Secondary | ICD-10-CM

## 2021-06-02 DIAGNOSIS — I10 Essential (primary) hypertension: Secondary | ICD-10-CM

## 2021-06-02 MED ORDER — HYDROCHLOROTHIAZIDE 12.5 MG PO TABS: 12.5000 mg | ORAL_TABLET | Freq: Every day | ORAL | 1 refills | Status: DC

## 2021-06-02 NOTE — Assessment & Plan Note (Addendum)
Increased today, however patient has been off of hydrochlorothiazide.  We refilled hydrochlorothiazide 12.5 mg.  Continue losartan 100 mg, propranolol 50mg  BID.

## 2021-06-02 NOTE — Assessment & Plan Note (Signed)
Severe pancytopenia.  Continues to follow with hematology for PRBC transfusion.  Emphasized importance of continued follow-up. Will follow

## 2021-06-02 NOTE — Progress Notes (Signed)
Subjective:    Patient ID: [name]., male    DOB: 1951-03-18, 70 y.o.   MRN: [id number]  CC: [name] [name]. is a 70 y.o. male who presents today for follow up.   HPI: Feels well today. No complaints,   Denies fatigue, sob.  Left leg swelling has improved. At baseline. No wounds, erythema, calf pain, fever, increased warmth.  Following with pain management for oxycodone, morphine.   GAD- follows with Dr Ardis Rowan whom prescribes xanax 80m.   Hypertension-compliant with losartan 100 mg, propranolol 548mBID, hctz 12.73m51mHe is out of HCTZ and hasnt taken today. He requests refill.   following with vascular for left leg swelling.  Last seen July of this year.  Prior unna wrap  Following with hematology, Dr. RaoJanese Banksr pancytopenia. Bone marrow biopsy 02/09/21 .  PRBC transfusion at that time and last transfusion 05/29/21.  Follow up with Dr raoJanese Banks25/22  Cirrhosis- following with Dr WohAllen Norrisast seen 01/26/21.Due for follow up and repeat colonoscopy.    Colonoscopy 11/2020 with endoscopy.   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   Squamous cell carcinoma of skin 05/21/2018   R mid volar forearm   Thyroid disease    UROLITHIASIS, HX OF 12/23/2007   Past Surgical History:  Procedure Laterality Date   ABOVE KNEE LEG AMPUTATION Right    Right femur fracture, 11/1994, resulted in right AKA 2006 after staph infection   COLONOSCOPY N/A 12/16/2020   Procedure: COLONOSCOPY;  Surgeon: TahVirgel ManifoldD;   Location: [facility] ENDOSCOPY;  Service: Endoscopy;  Laterality: N/A;   ESOPHAGOGASTRODUODENOSCOPY N/A 12/16/2020   Procedure: ESOPHAGOGASTRODUODENOSCOPY (EGD);  Surgeon: TahVirgel ManifoldD;  [facility];  Service: Endoscopy;  Laterality: N/A;   Family History  Problem Relation Age of Onset   Heart disease Mother    Lung cancer Mother 83 85Heart attack Paternal Grandmother    Heart attack Paternal Grandfather    Liver cancer Neg Hx     Allergies: Clindamycin Current Outpatient Medications on File Prior to Visit  Medication Sig Dispense Refill   ALPRAZolam (XANAX) 1 MG tablet Take 1 mg by mouth 3 (three) times daily.     Cholecalciferol (VITAMIN D3) 10 MCG (400 UNIT) CAPS Take 4 capsules by mouth daily.     folic acid (FOLVITE) 1 MG tablet TAKE 1 TABLET(1 MG) BY MOUTH DAILY 30 tablet 3   levothyroxine (SYNTHROID) 50 MCG tablet TAKE 1 TABLET(50 MCG) BY MOUTH DAILY BEFORE BREAKFAST 90 tablet 1   losartan (COZAAR) 100 MG tablet TAKE 1 TABLET(100 MG) BY MOUTH DAILY 90 tablet 1   lubiprostone (AMITIZA) 24 MCG capsule Take 24 mcg by mouth daily as needed.     meloxicam (MOBIC) 15 MG tablet Take 15 mg by mouth every morning.     morphine (MS CONTIN) 15 MG 12 hr tablet Take 15 mg by mouth 2 (two) times daily as needed.  Na Sulfate-K Sulfate-Mg Sulf (SUPREP BOWEL PREP KIT) 17.5-3.13-1.6 GM/177ML SOLN Take 1 kit by mouth as directed. 354 mL 0   NARCAN 4 MG/0.1ML LIQD nasal spray kit SPRAY 0.1 ML (4 MG) IN 1 NOSTRIL/ MAY REPEAT DOSE EVERY 2 3 MINUTES AS NEEDED ALTERNATING NOSTRILS     oxycodone (ROXICODONE) 30 MG immediate release tablet Take 1 tablet by mouth 5 (five) times daily as needed.     propranolol (INDERAL) 40 MG tablet TAKE 1 TABLET(40 MG) BY MOUTH TWICE DAILY 180 tablet 1   vitamin B-12 (CYANOCOBALAMIN) 1000 MCG tablet Take 1 tablet (1,000 mcg total) by mouth daily.     pantoprazole (PROTONIX) 40 MG tablet Take 1 tablet (40 mg total) by mouth daily. 30 tablet 0   No  current facility-administered medications on file prior to visit.    Social History   Tobacco Use   Smoking status: Never   Smokeless tobacco: Never  Substance Use Topics   Alcohol use: Not Currently    Alcohol/week: 0.0 standard drinks   Drug use: No    Review of Systems  Constitutional:  Negative for chills and fever.  Respiratory:  Negative for cough.   Cardiovascular:  Negative for chest pain and palpitations.  Gastrointestinal:  Negative for nausea and vomiting.     Objective:    BP 140/62 (BP Location: Left Arm, Patient Position: Sitting, Cuff Size: Large)   Pulse 71   Temp 98.6 F (37 C) (Oral)   Ht 6' (1.829 m)   SpO2 96%   BMI 28.48 kg/m  BP Readings from Last 3 Encounters:  06/02/21 140/62  05/25/21 128/75  03/20/21 118/63   Wt Readings from Last 3 Encounters:  03/20/21 210 lb (95.3 kg)  02/09/21 210 lb (95.3 kg)  02/07/21 210 lb (95.3 kg)    Physical Exam Vitals reviewed.  Constitutional:      Appearance: He is well-developed.  Cardiovascular:     Rate and Rhythm: Regular rhythm.     Heart sounds: Normal heart sounds.     Comments: Right leg amputated Left leg Non pitting +1 LLE edema. No palpable cords or masses. No erythema or increased warmth.  No discoloration or varicosities noted. Extremity warm. Hardened , dry skin    Pulmonary:     Effort: Pulmonary effort is normal. No respiratory distress.     Breath sounds: Normal breath sounds. No wheezing, rhonchi or rales.  Skin:    General: Skin is warm and dry.  Neurological:     Mental Status: He is alert.  Psychiatric:        Speech: Speech normal.        Behavior: Behavior normal.       Assessment & Plan:   Problem List Items Addressed This Visit       Cardiovascular and Mediastinum   HTN (hypertension)    Increased today, however patient has been off of hydrochlorothiazide.  We refilled hydrochlorothiazide 12.5 mg.  Continue losartan 100 mg, propranolol 68m BID.        Other    Anemia    Severe pancytopenia.  Continues to follow with hematology for PRBC transfusion.  Emphasized importance of continued follow-up. Will follow      Left leg swelling    Stable.No symptoms to suggest infection.   Appearance of lymphedema.  Will follow      Other Visit Diagnoses     Need for immunization against influenza    -  Primary   Relevant Orders  Flu Vaccine QUAD High Dose(Fluad) (Completed)        I am having Deitrick C. Scovill Jr. maintain his ALPRAZolam, oxycodone, vitamin B-12, lubiprostone, Narcan, Vitamin D3, morphine, propranolol, pantoprazole, levothyroxine, meloxicam, Suprep Bowel Prep Kit, folic acid, and losartan.   No orders of the defined types were placed in this encounter.   Return precautions given.   Risks, benefits, and alternatives of the medications and treatment plan prescribed today were discussed, and patient expressed understanding.   Education regarding symptom management and diagnosis given to patient on AVS.  Continue to follow with [name], FNP for routine health maintenance.   Donne Anon Giancola Jr. and I agreed with plan.   Mable Paris, FNP

## 2021-06-02 NOTE — Patient Instructions (Addendum)
Please check all medications at home and compare to list that I have given you.   We will make a follow up for you with gastroenterology.   Always nice to see you.

## 2021-06-02 NOTE — Assessment & Plan Note (Signed)
Stable.No symptoms to suggest infection.   Appearance of lymphedema.  Will follow

## 2021-06-14 ENCOUNTER — Other Ambulatory Visit: Payer: Self-pay | Admitting: Family

## 2021-06-14 DIAGNOSIS — E039 Hypothyroidism, unspecified: Secondary | ICD-10-CM

## 2021-06-19 ENCOUNTER — Other Ambulatory Visit: Payer: Self-pay

## 2021-06-19 ENCOUNTER — Inpatient Hospital Stay: Payer: Medicare Other | Attending: Oncology

## 2021-06-19 ENCOUNTER — Telehealth: Payer: Self-pay | Admitting: *Deleted

## 2021-06-19 DIAGNOSIS — D61818 Other pancytopenia: Secondary | ICD-10-CM | POA: Insufficient documentation

## 2021-06-19 DIAGNOSIS — D649 Anemia, unspecified: Secondary | ICD-10-CM

## 2021-06-19 LAB — CBC WITH DIFFERENTIAL/PLATELET
Abs Immature Granulocytes: 0.03 10*3/uL (ref 0.00–0.07)
Basophils Absolute: 0 10*3/uL (ref 0.0–0.1)
Basophils Relative: 1 %
Eosinophils Absolute: 0.1 10*3/uL (ref 0.0–0.5)
Eosinophils Relative: 5 %
HCT: 25 % — ABNORMAL LOW (ref 39.0–52.0)
Hemoglobin: 8 g/dL — ABNORMAL LOW (ref 13.0–17.0)
Immature Granulocytes: 2 %
Lymphocytes Relative: 31 %
Lymphs Abs: 0.6 10*3/uL — ABNORMAL LOW (ref 0.7–4.0)
MCH: 33.9 pg (ref 26.0–34.0)
MCHC: 32 g/dL (ref 30.0–36.0)
MCV: 105.9 fL — ABNORMAL HIGH (ref 80.0–100.0)
Monocytes Absolute: 0.1 10*3/uL (ref 0.1–1.0)
Monocytes Relative: 7 %
Neutro Abs: 1 10*3/uL — ABNORMAL LOW (ref 1.7–7.7)
Neutrophils Relative %: 54 %
Platelets: 35 10*3/uL — ABNORMAL LOW (ref 150–400)
RBC: 2.36 MIL/uL — ABNORMAL LOW (ref 4.22–5.81)
RDW: 20.6 % — ABNORMAL HIGH (ref 11.5–15.5)
WBC: 1.9 10*3/uL — ABNORMAL LOW (ref 4.0–10.5)
nRBC: 0 % (ref 0.0–0.2)

## 2021-06-19 NOTE — Telephone Encounter (Signed)
Called pt and got his voicemail and left message that states that he had blood work today and the hgb 8 so the pt does not need a blood transfusion and he does not need to come to cancer center tomorrow due to his hgb is 8. thanks

## 2021-06-20 ENCOUNTER — Inpatient Hospital Stay: Payer: Medicare Other

## 2021-06-20 LAB — SAMPLE TO BLOOD BANK

## 2021-06-21 ENCOUNTER — Inpatient Hospital Stay: Payer: Medicare Other

## 2021-06-25 ENCOUNTER — Other Ambulatory Visit: Payer: Self-pay | Admitting: Family

## 2021-06-25 DIAGNOSIS — I10 Essential (primary) hypertension: Secondary | ICD-10-CM

## 2021-07-18 ENCOUNTER — Ambulatory Visit: Payer: Medicare Other | Admitting: Gastroenterology

## 2021-07-18 NOTE — Progress Notes (Deleted)
Primary Care Physician: [name], FNP  Primary Gastroenterologist:  Dr. Lucilla Lame  No chief complaint on file.   HPI: [name]. is a 70 y.o. male here with a history of cirrhosis and for follow-up.  The patient was last seen for an EGD and colonoscopy by one of my partners and then followed up in the office with me in June.  At that time we discussed taking off the polyps that were found during his colonoscopy after receiving medication to try and boost his platelet count because he has been running around the 30s.  The patient has had profound anemia and was seen by oncology with a bone marrow aspirate and an exhaustive hematology work-up for his anemia.  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     Cholecalciferol (VITAMIN D3) 10 MCG (400 UNIT) CAPS Take 4 capsules by mouth daily.     folic acid (FOLVITE) 1 MG tablet TAKE 1 TABLET(1 MG) BY MOUTH DAILY 30 tablet 3   hydrochlorothiazide (HYDRODIURIL) 12.5 MG tablet Take 1 tablet (12.5 mg total) by mouth daily. 30 tablet 1   levothyroxine (SYNTHROID) 50 MCG tablet TAKE 1  TABLET(50 MCG) BY MOUTH DAILY BEFORE BREAKFAST 90 tablet 1   losartan (COZAAR) 100 MG tablet TAKE 1 TABLET(100 MG) BY MOUTH DAILY 90 tablet 1   lubiprostone (AMITIZA) 24 MCG capsule Take 24 mcg by mouth daily as needed.     meloxicam (MOBIC) 15 MG tablet Take 15 mg by mouth every morning.     morphine (MS CONTIN) 15 MG 12 hr tablet Take 15 mg by mouth 2 (two) times daily as needed.     Na Sulfate-K Sulfate-Mg Sulf (SUPREP BOWEL PREP KIT) 17.5-3.13-1.6 GM/177ML SOLN Take 1 kit by mouth as directed. 354 mL 0   NARCAN 4 MG/0.1ML LIQD nasal spray kit SPRAY 0.1 ML (4 MG) IN 1 NOSTRIL/ MAY REPEAT DOSE EVERY 2 3 MINUTES AS NEEDED ALTERNATING NOSTRILS     oxycodone (ROXICODONE) 30 MG immediate release tablet Take 1 tablet by mouth 5 (five) times daily as needed.     pantoprazole (PROTONIX) 40 MG tablet Take 1 tablet (40 mg total) by mouth daily. 30 tablet 0   propranolol (INDERAL) 40 MG tablet TAKE 1 TABLET(40 MG) BY MOUTH TWICE DAILY 180 tablet 1   vitamin B-12 (CYANOCOBALAMIN) 1000 MCG tablet Take 1 tablet (1,000 mcg total) by mouth daily.     No current facility-administered medications for this visit.    Allergies as of 07/18/2021 - Review Complete 06/02/2021  Allergen Reaction Noted   Clindamycin Dermatitis 07/08/2012    ROS:  General: Negative for anorexia, weight loss, fever, chills, fatigue, weakness. ENT: Negative for hoarseness, difficulty swallowing , nasal congestion. CV: Negative for chest pain, angina, palpitations, dyspnea on exertion, peripheral edema.  Respiratory: Negative for dyspnea at rest, dyspnea on exertion, cough, sputum, wheezing.  GI: See history of present illness. GU:  Negative for dysuria, hematuria, urinary incontinence, urinary frequency, nocturnal urination.  Endo: Negative for unusual weight change.    Physical Examination:   There were no vitals taken for this visit.  General: Well-nourished, well-developed in no acute distress.  Eyes: No icterus.  Conjunctivae pink. Lungs: Clear to auscultation bilaterally. Non-labored. Heart: Regular rate and rhythm, no murmurs rubs or gallops.  Abdomen: Bowel sounds are normal, nontender, nondistended, no hepatosplenomegaly or masses, no abdominal bruits or hernia , no rebound or guarding.   Extremities: No lower extremity edema. No clubbing or deformities. Neuro: Alert and oriented x 3.  Grossly intact. Skin: Warm and dry, no jaundice.   Psych: Alert and cooperative, normal mood and affect.  Labs:    Imaging Studies: No results found.  Assessment and Plan:   Donne Anon Sparkman [name]. is a 70 y.o. y/o male ***     Lucilla Lame, MD. Marval Regal    Note: This dictation was prepared with Dragon dictation along with smaller phrase technology. Any transcriptional errors that result from this process are unintentional.

## 2021-07-19 ENCOUNTER — Other Ambulatory Visit: Payer: Self-pay | Admitting: *Deleted

## 2021-07-19 DIAGNOSIS — D649 Anemia, unspecified: Secondary | ICD-10-CM

## 2021-07-25 ENCOUNTER — Inpatient Hospital Stay: Payer: Medicare Other

## 2021-07-26 ENCOUNTER — Inpatient Hospital Stay: Payer: Medicare Other

## 2021-07-27 ENCOUNTER — Other Ambulatory Visit: Payer: Self-pay

## 2021-07-27 ENCOUNTER — Telehealth: Payer: Self-pay | Admitting: *Deleted

## 2021-07-27 ENCOUNTER — Inpatient Hospital Stay: Payer: Medicare Other | Attending: Oncology

## 2021-07-27 DIAGNOSIS — D649 Anemia, unspecified: Secondary | ICD-10-CM

## 2021-07-27 DIAGNOSIS — D61818 Other pancytopenia: Secondary | ICD-10-CM | POA: Insufficient documentation

## 2021-07-27 LAB — CBC WITH DIFFERENTIAL/PLATELET
Abs Immature Granulocytes: 0.03 10*3/uL (ref 0.00–0.07)
Basophils Absolute: 0 10*3/uL (ref 0.0–0.1)
Basophils Relative: 1 %
Eosinophils Absolute: 0.1 10*3/uL (ref 0.0–0.5)
Eosinophils Relative: 4 %
HCT: 25.7 % — ABNORMAL LOW (ref 39.0–52.0)
Hemoglobin: 8.6 g/dL — ABNORMAL LOW (ref 13.0–17.0)
Immature Granulocytes: 2 %
Lymphocytes Relative: 34 %
Lymphs Abs: 0.7 10*3/uL (ref 0.7–4.0)
MCH: 34.7 pg — ABNORMAL HIGH (ref 26.0–34.0)
MCHC: 33.5 g/dL (ref 30.0–36.0)
MCV: 103.6 fL — ABNORMAL HIGH (ref 80.0–100.0)
Monocytes Absolute: 0.1 10*3/uL (ref 0.1–1.0)
Monocytes Relative: 6 %
Neutro Abs: 1.1 10*3/uL — ABNORMAL LOW (ref 1.7–7.7)
Neutrophils Relative %: 53 %
Platelets: 32 10*3/uL — ABNORMAL LOW (ref 150–400)
RBC: 2.48 MIL/uL — ABNORMAL LOW (ref 4.22–5.81)
RDW: 18.8 % — ABNORMAL HIGH (ref 11.5–15.5)
WBC: 2 10*3/uL — ABNORMAL LOW (ref 4.0–10.5)
nRBC: 0 % (ref 0.0–0.2)

## 2021-07-27 LAB — SAMPLE TO BLOOD BANK

## 2021-07-27 NOTE — Telephone Encounter (Signed)
Called pt to let him know that his hgb 8.6 and plt 32. Does not need blood tom. He is happy with the results and that he will not need to come tom.

## 2021-07-28 ENCOUNTER — Inpatient Hospital Stay: Payer: Medicare Other

## 2021-08-01 ENCOUNTER — Ambulatory Visit: Payer: Medicare Other | Admitting: Oncology

## 2021-08-01 ENCOUNTER — Other Ambulatory Visit: Payer: Medicare Other

## 2021-08-09 ENCOUNTER — Ambulatory Visit (INDEPENDENT_AMBULATORY_CARE_PROVIDER_SITE_OTHER): Payer: Medicare Other

## 2021-08-09 VITALS — Ht 72.0 in | Wt 210.0 lb

## 2021-08-09 DIAGNOSIS — Z0000 Encounter for general adult medical examination without abnormal findings: Secondary | ICD-10-CM | POA: Diagnosis not present

## 2021-08-09 NOTE — Patient Instructions (Addendum)
Mr. Haluska , Thank you for taking time to come for your Medicare Wellness Visit. I appreciate your ongoing commitment to your health goals. Please review the following plan we discussed and let me know if I can assist you in the future.   These are the goals we discussed:  Goals       Patient Stated     Follow up with Primary Care Provider (pt-stated)      As needed        This is a list of the screening recommended for you and due dates:  Health Maintenance  Topic Date Due   COVID-19 Vaccine (6 - Booster) 08/25/2021*   Zoster (Shingles) Vaccine (1 of 2) 11/07/2021*   Tetanus Vaccine  08/09/2022*   Colon Cancer Screening  12/17/2030   Pneumonia Vaccine  Completed   Flu Shot  Completed   HPV Vaccine  Aged Out   Hepatitis C Screening: USPSTF Recommendation to screen - Ages 18-79 yo.  Discontinued  *Topic was postponed. The date shown is not the original due date.    Advanced directives: on file  Conditions/risks identified: none new  Follow up in one year for your annual wellness visit.   Preventive Care 73 Years and Older, Male Preventive care refers to lifestyle choices and visits with your health care provider that can promote health and wellness. What does preventive care include? A yearly physical exam. This is also called an annual well check. Dental exams once or twice a year. Routine eye exams. Ask your health care provider how often you should have your eyes checked. Personal lifestyle choices, including: Daily care of your teeth and gums. Regular physical activity. Eating a healthy diet. Avoiding tobacco and drug use. Limiting alcohol use. Practicing safe sex. Taking low doses of aspirin every day. Taking vitamin and mineral supplements as recommended by your health care provider. What happens during an annual well check? The services and screenings done by your health care provider during your annual well check will depend on your age, overall health,  lifestyle risk factors, and family history of disease. Counseling  Your health care provider may ask you questions about your: Alcohol use. Tobacco use. Drug use. Emotional well-being. Home and relationship well-being. Sexual activity. Eating habits. History of falls. Memory and ability to understand (cognition). Work and work Statistician. Screening  You may have the following tests or measurements: Height, weight, and BMI. Blood pressure. Lipid and cholesterol levels. These may be checked every 5 years, or more frequently if you are over 73 years old. Skin check. Lung cancer screening. You may have this screening every year starting at age 73 if you have a 30-pack-year history of smoking and currently smoke or have quit within the past 15 years. Fecal occult blood test (FOBT) of the stool. You may have this test every year starting at age 73. Flexible sigmoidoscopy or colonoscopy. You may have a sigmoidoscopy every 5 years or a colonoscopy every 10 years starting at age 73. Prostate cancer screening. Recommendations will vary depending on your family history and other risks. Hepatitis C blood test. Hepatitis B blood test. Sexually transmitted disease (STD) testing. Diabetes screening. This is done by checking your blood sugar (glucose) after you have not eaten for a while (fasting). You may have this done every 1-3 years. Abdominal aortic aneurysm (AAA) screening. You may need this if you are a current or former smoker. Osteoporosis. You may be screened starting at age 73 if you are at high  risk. Talk with your health care provider about your test results, treatment options, and if necessary, the need for more tests. Vaccines  Your health care provider may recommend certain vaccines, such as: Influenza vaccine. This is recommended every year. Tetanus, diphtheria, and acellular pertussis (Tdap, Td) vaccine. You may need a Td booster every 10 years. Zoster vaccine. You may need this  after age 73. Pneumococcal 13-valent conjugate (PCV13) vaccine. One dose is recommended after age 73. Pneumococcal polysaccharide (PPSV23) vaccine. One dose is recommended after age 73. Talk to your health care provider about which screenings and vaccines you need and how often you need them. This information is not intended to replace advice given to you by your health care provider. Make sure you discuss any questions you have with your health care provider. Document Released: 08/12/2015 Document Revised: 04/04/2016 Document Reviewed: 05/17/2015 Elsevier Interactive Patient Education  2017 [place] Prevention in the Home Falls can cause injuries. They can happen to people of all ages. There are many things you can do to make your home safe and to help prevent falls. What can I do on the outside of my home? Regularly fix the edges of walkways and driveways and fix any cracks. Remove anything that might make you trip as you walk through a door, such as a raised step or threshold. Trim any bushes or trees on the path to your home. Use bright outdoor lighting. Clear any walking paths of anything that might make someone trip, such as rocks or tools. Regularly check to see if handrails are loose or broken. Make sure that both sides of any steps have handrails. Any raised decks and porches should have guardrails on the edges. Have any leaves, snow, or ice cleared regularly. Use sand or salt on walking paths during winter. Clean up any spills in your garage right away. This includes oil or grease spills. What can I do in the bathroom? Use night lights. Install grab bars by the toilet and in the tub and shower. Do not use towel bars as grab bars. Use non-skid mats or decals in the tub or shower. If you need to sit down in the shower, use a plastic, non-slip stool. Keep the floor dry. Clean up any water that spills on the floor as soon as it happens. Remove soap buildup in the tub or  shower regularly. Attach bath mats securely with double-sided non-slip rug tape. Do not have throw rugs and other things on the floor that can make you trip. What can I do in the bedroom? Use night lights. Make sure that you have a light by your bed that is easy to reach. Do not use any sheets or blankets that are too big for your bed. They should not hang down onto the floor. Have a firm chair that has side arms. You can use this for support while you get dressed. Do not have throw rugs and other things on the floor that can make you trip. What can I do in the kitchen? Clean up any spills right away. Avoid walking on wet floors. Keep items that you use a lot in easy-to-reach places. If you need to reach something above you, use a strong step stool that has a grab bar. Keep electrical cords out of the way. Do not use floor polish or wax that makes floors slippery. If you must use wax, use non-skid floor wax. Do not have throw rugs and other things on the floor that can  make you trip. What can I do with my stairs? Do not leave any items on the stairs. Make sure that there are handrails on both sides of the stairs and use them. Fix handrails that are broken or loose. Make sure that handrails are as long as the stairways. Check any carpeting to make sure that it is firmly attached to the stairs. Fix any carpet that is loose or worn. Avoid having throw rugs at the top or bottom of the stairs. If you do have throw rugs, attach them to the floor with carpet tape. Make sure that you have a light switch at the top of the stairs and the bottom of the stairs. If you do not have them, ask someone to add them for you. What else can I do to help prevent falls? Wear shoes that: Do not have high heels. Have rubber bottoms. Are comfortable and fit you well. Are closed at the toe. Do not wear sandals. If you use a stepladder: Make sure that it is fully opened. Do not climb a closed stepladder. Make  sure that both sides of the stepladder are locked into place. Ask someone to hold it for you, if possible. Clearly mark and make sure that you can see: Any grab bars or handrails. First and last steps. Where the edge of each step is. Use tools that help you move around (mobility aids) if they are needed. These include: Canes. Walkers. Scooters. Crutches. Turn on the lights when you go into a dark area. Replace any light bulbs as soon as they burn out. Set up your furniture so you have a clear path. Avoid moving your furniture around. If any of your floors are uneven, fix them. If there are any pets around you, be aware of where they are. Review your medicines with your doctor. Some medicines can make you feel dizzy. This can increase your chance of falling. Ask your doctor what other things that you can do to help prevent falls. This information is not intended to replace advice given to you by your health care provider. Make sure you discuss any questions you have with your health care provider. Document Released: 05/12/2009 Document Revised: 12/22/2015 Document Reviewed: 08/20/2014 Elsevier Interactive Patient Education  2017 [employer].

## 2021-08-09 NOTE — Progress Notes (Signed)
Subjective:   [name]. is a 71 y.o. male who presents for Medicare Annual/Subsequent preventive examination.  Review of Systems    No ROS.  Medicare Wellness Virtual Visit.  Visual/audio telehealth visit, UTA vital signs.   See social history for additional risk factors.         Objective:    Today's Vitals   08/09/21 1035  Weight: 210 lb (95.3 kg)  Height: 6' (1.829 m)   Body mass index is 28.48 kg/m.  Advanced Directives 08/09/2021 01/11/2021 12/15/2020 12/14/2020 08/08/2020 04/08/2020 02/11/2020  Does Patient Have a Medical Advance Directive? Yes Yes - No Yes Yes No  Type of Paramedic of [place];Living will - - - [place];Living will [place];Living will -  Does patient want to make changes to medical advance directive? No - Patient declined - - - No - Patient declined - -  Copy of [place] in Chart? Yes - validated most recent copy scanned in chart (See row information) - - - Yes - validated most recent copy scanned in chart (See row information) - -  Would patient like information on creating a medical advance directive? - - No - Patient declined - - - -    Current Medications (verified) Outpatient Encounter Medications as of 08/09/2021  Medication Sig   ALPRAZolam (XANAX) 1 MG tablet Take 1 mg by mouth 3 (three) times daily.   Cholecalciferol (VITAMIN D3) 10 MCG (400 UNIT) CAPS Take 4 capsules by mouth daily.   folic acid (FOLVITE) 1 MG tablet TAKE 1 TABLET(1 MG) BY MOUTH DAILY   hydrochlorothiazide (HYDRODIURIL) 12.5 MG tablet Take 1 tablet (12.5 mg total) by mouth daily.   levothyroxine (SYNTHROID) 50 MCG tablet TAKE 1 TABLET(50 MCG) BY MOUTH DAILY BEFORE BREAKFAST   losartan (COZAAR) 100 MG tablet TAKE 1 TABLET(100 MG) BY MOUTH DAILY   lubiprostone (AMITIZA) 24 MCG capsule Take 24 mcg by mouth daily as needed.   meloxicam (MOBIC) 15 MG tablet Take 15 mg by mouth every morning.    morphine (MS CONTIN) 15 MG 12 hr tablet Take 15 mg by mouth 2 (two) times daily as needed.   Na Sulfate-K Sulfate-Mg Sulf (SUPREP BOWEL PREP KIT) 17.5-3.13-1.6 GM/177ML SOLN Take 1 kit by mouth as directed.   NARCAN 4 MG/0.1ML LIQD nasal spray kit SPRAY 0.1 ML (4 MG) IN 1 NOSTRIL/ MAY REPEAT DOSE EVERY 2 3 MINUTES AS NEEDED ALTERNATING NOSTRILS   oxycodone (ROXICODONE) 30 MG immediate release tablet Take 1 tablet by mouth 5 (five) times daily as needed.   pantoprazole (PROTONIX) 40 MG tablet Take 1 tablet (40 mg total) by mouth daily.   propranolol (INDERAL) 40 MG tablet TAKE 1 TABLET(40 MG) BY MOUTH TWICE DAILY   vitamin B-12 (CYANOCOBALAMIN) 1000 MCG tablet Take 1 tablet (1,000 mcg total) by mouth daily.   No facility-administered encounter medications on file as of 08/09/2021.    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  ° Squamous cell carcinoma of skin 05/21/2018  ° R mid volar forearm  ° Thyroid disease   ° UROLITHIASIS, HX OF 12/23/2007  ° °Past Surgical History:  °Procedure Laterality Date  ° ABOVE KNEE LEG AMPUTATION Right   ° Right femur fracture, 11/1994, resulted in right AKA 2006 after staph infection  ° COLONOSCOPY N/A 12/16/2020  ° Procedure: COLONOSCOPY;  Surgeon: Tahiliani, Varnita B, MD;  Location: [facility] ENDOSCOPY;  Service: Endoscopy;  Laterality: N/A;  ° ESOPHAGOGASTRODUODENOSCOPY N/A 12/16/2020  ° Procedure: ESOPHAGOGASTRODUODENOSCOPY (EGD);  Surgeon: Tahiliani, Varnita B, MD;   Location: [facility] ENDOSCOPY;  Service: Endoscopy;  Laterality: N/A;  ° °Family History  °Problem Relation Age of Onset  ° Heart disease Mother   ° Lung cancer Mother 83  ° Heart attack Paternal Grandmother   ° Heart attack Paternal Grandfather   ° Liver cancer Neg Hx   ° °Social History  ° °Socioeconomic History  ° Marital status: Widowed  °  Spouse name: Not on file  ° Number of children: Not on file  ° Years of education: Not on file  ° Highest education level: Not on file  °Occupational History  ° Occupation: Disabled  °Tobacco Use  ° Smoking status: Never  ° Smokeless tobacco: Never  °Substance and Sexual Activity  ° Alcohol use: Not Currently  °  Alcohol/week: 0.0 standard drinks  ° Drug use: No  ° Sexual activity: Not on file  °Other Topics Concern  ° Not on file  °Social History Narrative  ° Widowed 1997  ° Lives alone  ° Lost a son in his arms in 1978.   ° 3 sons  ° °Social Determinants of Health  ° °Financial Resource Strain: Not on file  °Food Insecurity: Not on file  °Transportation Needs: Not on file  °Physical Activity: Not on file  °Stress: Not on file  °Social Connections: Not on file  ° ° °Tobacco Counseling °Counseling given: Not Answered ° ° °Clinical Intake: ° °Pre-visit preparation completed: Yes ° °  ° °  ° °Diabetes: No ° °How often do you need to have someone help you when you read instructions, pamphlets, or other written materials from your doctor or pharmacy?: 1 - Never ° ° ° °Interpreter Needed?: No ° °  ° ° °Activities of Daily Living °In your present state of health, do you have any difficulty performing the following activities: 08/09/2021 02/09/2021  °Hearing? N N  °Vision? N N  °Difficulty concentrating or making decisions? N N  °Walking or climbing stairs? Y N  °Dressing or bathing? Y N  °Comment Aide -  °Doing errands, shopping? N -  °Preparing Food and eating ? N -  °Comment Meals on wheels. He is able to prepare small meals in the microwave. Self feeds. -  °Using the Toilet? N -  °In  the past six months, have you accidently leaked urine? N -  °Do you have problems with loss of bowel control? N -  °Managing your Medications? N -  °Managing your Finances? N -  °Housekeeping or managing your Housekeeping? Y -  °Comment Aide assist -  °Some recent data might be hidden  ° ° °Patient Care Team: °Arnett, Margaret G, FNP as PCP - General (Family Medicine) °Patterson, David R, MD as Attending Physician (Gastroenterology) °Gyarteng-Dakwa, Kwadwo, MD as Referring Physician (Anesthesiology) °Gould, Karen, MD as Attending Physician (Dermatology) °Jezsik, Janet (Internal Medicine) °Plovsky, Gerald, MD as Consulting Physician (Psychiatry) ° °Indicate   Physician (Psychiatry)  Indicate any recent Medical Services you may have received from other than [facility] providers in the past year (date may be approximate).     Assessment:   This is a routine wellness examination for [place].  Virtual Visit via Telephone Note  I connected with  Derek Anon Mateus Jr. on 08/09/21 at 10:30 AM EST by telephone and verified that I am speaking with the correct person using two identifiers.  Persons participating in the virtual visit: patient/Nurse Health Advisor   I discussed the limitations, risks, security and privacy concerns of performing an evaluation and management service by telephone and the availability of in person appointments. The patient expressed understanding and agreed to proceed.  Interactive audio and video telecommunications were attempted between this nurse and patient, however failed, due to patient having technical difficulties OR patient did not have access to video capability.  We continued and completed visit with audio only.  Some vital signs may be absent or patient reported.   Hearing/Vision screen Hearing Screening - Comments:: Patient is able to hear conversational tones without difficulty. No issues reported. Vision Screening - Comments:: Wears corrective lenses when reading  Dietary issues and exercise activities  discussed:   Regular diet    Goals Addressed               This Visit's Progress     Patient Stated     Follow up with Primary Care Provider (pt-stated)        As needed       Depression Screen PHQ 2/9 Scores 08/09/2021 08/08/2020 08/04/2019 06/15/2019  PHQ - 2 Score 0 0 0 0    Fall Risk Fall Risk  08/09/2021 01/03/2021 12/29/2020 12/14/2020 08/08/2020  Falls in the past year? 0 1 1 0 0  Comment - - - - -  Number falls in past yr: 0 - 1 0 0  Injury with Fall? - 1 1 0 -  Risk for fall due to : History of fall(s);Impaired mobility;Impaired balance/gait - - - History of fall(s)  Follow up Education provided Falls evaluation completed - Falls evaluation completed Falls evaluation completed   [place]: Home free of loose throw rugs in walkways, pet beds, electrical cords, etc? Yes  Adequate lighting in your home to reduce risk of falls? Yes   ASSISTIVE DEVICES UTILIZED TO PREVENT FALLS: Life alert? No  Use of a cane, walker or w/c? Yes  Grab bars in the bathroom? Yes  Shower chair or bench in shower? Yes  Elevated toilet seat or a handicapped toilet? Yes   TIMED UP AND GO: Was the test performed? No .   Cognitive Function:  Patient is alert and oriented x3.    6CIT Screen 08/09/2021 08/08/2020 08/04/2019  What Year? 0 points 0 points 0 points  What month? 0 points 0 points 0 points  What time? 0 points 0 points 0 points  Count back from 20 0 points 0 points 0 points  Months in reverse 0 points 0 points 0 points  Repeat phrase - - 0 points  Total Score - - 0   Immunizations Immunization History  Administered Date(s) Administered   Fluad Quad(high Dose 65+) 08/05/2020, 06/02/2021   Influenza Split 05/21/2012   Influenza Whole 09/12/2009   Influenza, High Dose Seasonal PF 05/22/2016, 10/03/2018   Influenza, Seasonal, Injecte, Preservative Fre 05/06/2013   Influenza-Unspecified 05/17/2011, 04/29/2013   PFIZER Comirnaty(Gray Top)Covid-19  Tri-Sucrose Vaccine 09/21/2019, 10/12/2019   PFIZER(Purple  Top)SARS-COV-2 Vaccination 09/21/2019, 10/12/2019, 07/16/2020   Pneumococcal Conjugate-13 01/06/2015   Pneumococcal Polysaccharide-23 08/18/2014   Pneumococcal-Unspecified 10/20/2009   TDAP status: Due, Education has been provided regarding the importance of this vaccine. Advised may receive this vaccine at local pharmacy or Health Dept. Aware to provide a copy of the vaccination record if obtained from local pharmacy or Health Dept. Verbalized acceptance and understanding. Deferred.   Shingrix Completed?: No.    Education has been provided regarding the importance of this vaccine. Patient has been advised to call insurance company to determine out of pocket expense if they have not yet received this vaccine. Advised may also receive vaccine at local pharmacy or Health Dept. Verbalized acceptance and understanding.  Screening Tests Health Maintenance  Topic Date Due   COVID-19 Vaccine (6 - Booster) 08/25/2021 (Originally 09/10/2020)   Zoster Vaccines- Shingrix (1 of 2) 11/07/2021 (Originally 06/29/1970)   TETANUS/TDAP  08/09/2022 (Originally 06/29/1970)   COLONOSCOPY (Pts 45-67yr Insurance coverage will need to be confirmed)  12/17/2030   Pneumonia Vaccine 71 Years old  Completed   INFLUENZA VACCINE  Completed   HPV VACCINES  Aged Out   Hepatitis C Screening  Discontinued   Health Maintenance There are no preventive care reminders to display for this patient.  Lung Cancer Screening: (Low Dose CT Chest recommended if Age 73-80years, 30 pack-year currently smoking OR have quit w/in 15years.) does not qualify.   Vision Screening: Recommended annual ophthalmology exams for early detection of glaucoma and other disorders of the eye.  Dental Screening: Recommended annual dental exams for proper oral hygiene  Community Resource Referral / Chronic Care Management: CRR required this visit?  No   CCM required this visit?  No       Plan:   Keep all routine maintenance appointments.   I have personally reviewed and noted the following in the patients chart:   Medical and social history Use of alcohol, tobacco or illicit drugs  Current medications and supplements including opioid prescriptions. Patient is currently taking opioid prescriptions. Information provided to patient regarding non-opioid alternatives. Patient advised to discuss non-opioid treatment plan with their provider. Followed by pain clinic.  Functional ability and status Nutritional status Physical activity Advanced directives List of other physicians Hospitalizations, surgeries, and ER visits in previous 12 months Vitals Screenings to include cognitive, depression, and falls Referrals and appointments  In addition, I have reviewed and discussed with patient certain preventive protocols, quality metrics, and best practice recommendations. A written personalized care plan for preventive services as well as general preventive health recommendations were provided to patient.     OVarney Biles LPN   13/25/4982

## 2021-08-10 ENCOUNTER — Other Ambulatory Visit: Payer: Self-pay | Admitting: Family

## 2021-08-15 ENCOUNTER — Other Ambulatory Visit: Payer: Self-pay | Admitting: *Deleted

## 2021-08-15 DIAGNOSIS — D649 Anemia, unspecified: Secondary | ICD-10-CM

## 2021-08-22 ENCOUNTER — Inpatient Hospital Stay: Payer: Medicare Other | Attending: Oncology

## 2021-08-22 ENCOUNTER — Other Ambulatory Visit: Payer: Self-pay

## 2021-08-22 DIAGNOSIS — D594 Other nonautoimmune hemolytic anemias: Secondary | ICD-10-CM | POA: Diagnosis present

## 2021-08-22 DIAGNOSIS — Z801 Family history of malignant neoplasm of trachea, bronchus and lung: Secondary | ICD-10-CM | POA: Insufficient documentation

## 2021-08-22 DIAGNOSIS — Z79899 Other long term (current) drug therapy: Secondary | ICD-10-CM | POA: Insufficient documentation

## 2021-08-22 DIAGNOSIS — D649 Anemia, unspecified: Secondary | ICD-10-CM

## 2021-08-22 LAB — CBC WITH DIFFERENTIAL/PLATELET
Abs Immature Granulocytes: 0.03 10*3/uL (ref 0.00–0.07)
Basophils Absolute: 0 10*3/uL (ref 0.0–0.1)
Basophils Relative: 1 %
Eosinophils Absolute: 0.1 10*3/uL (ref 0.0–0.5)
Eosinophils Relative: 2 %
HCT: 30.3 % — ABNORMAL LOW (ref 39.0–52.0)
Hemoglobin: 10.1 g/dL — ABNORMAL LOW (ref 13.0–17.0)
Immature Granulocytes: 1 %
Lymphocytes Relative: 35 %
Lymphs Abs: 1.1 10*3/uL (ref 0.7–4.0)
MCH: 34.4 pg — ABNORMAL HIGH (ref 26.0–34.0)
MCHC: 33.3 g/dL (ref 30.0–36.0)
MCV: 103.1 fL — ABNORMAL HIGH (ref 80.0–100.0)
Monocytes Absolute: 0.3 10*3/uL (ref 0.1–1.0)
Monocytes Relative: 9 %
Neutro Abs: 1.6 10*3/uL — ABNORMAL LOW (ref 1.7–7.7)
Neutrophils Relative %: 52 %
Platelets: 41 10*3/uL — ABNORMAL LOW (ref 150–400)
RBC: 2.94 MIL/uL — ABNORMAL LOW (ref 4.22–5.81)
RDW: 19.5 % — ABNORMAL HIGH (ref 11.5–15.5)
WBC: 3 10*3/uL — ABNORMAL LOW (ref 4.0–10.5)
nRBC: 0 % (ref 0.0–0.2)

## 2021-08-22 LAB — SAMPLE TO BLOOD BANK

## 2021-08-23 ENCOUNTER — Inpatient Hospital Stay (HOSPITAL_BASED_OUTPATIENT_CLINIC_OR_DEPARTMENT_OTHER): Payer: Medicare Other | Admitting: Oncology

## 2021-08-23 ENCOUNTER — Encounter: Payer: Self-pay | Admitting: Oncology

## 2021-08-23 ENCOUNTER — Inpatient Hospital Stay: Payer: Medicare Other

## 2021-08-23 VITALS — BP 157/76 | HR 72 | Temp 98.6°F | Resp 18 | Ht 72.0 in | Wt 220.0 lb

## 2021-08-23 DIAGNOSIS — D594 Other nonautoimmune hemolytic anemias: Secondary | ICD-10-CM

## 2021-08-23 NOTE — Progress Notes (Signed)
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   Heme/Onc history: Patient is a 71 year old male with a past medical history significant for hepatitis C, prior history of alcohol intake, cirrhosis, 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  He has seen Dr. Allen Norris for cirrhosis in the past.  He has not had any recent episodes of ascites.  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  With regards to his CBC patient has had baseline thrombocytopenia and his platelet counts mainly fluctuate between 40-60 at least since 2015.  He also has chronic leukopenia with a white count that fluctuates between 2.3-3.5 at least dating back to 2015.  Differential has mainly showed lymphopenia but at times neutropenia.  Hemoglobin has been mostly between 9.5-10.5 most recently patient was found to have a white count of 3.5, H&H of 9.7/28.8 and a platelet count of 65 and has been referred to [place] for pancytopenia.  In terms of his CMP he has had elevated bilirubin which fluctuates between 1.6-2.9 in the past but was recently noted to have a bilirubin of 3.1 mostly indirect hyperbilirubinemia with normal AST and ALT   Patient uses a motorized wheelchair for ambulation but is independent of his ADLs and IADLs and is able to drive  as well.  Appetite and weight have been stable.  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  Currently patient denies any bleeding in his stool or urine.  Denies any dark tarry stools   Results of blood work from 12/21/2019 were as follows: CBC showed white count of 6.2, H&H of 10.3/30.2 with an MCV of 96 and a platelet count of 99.  Total bilirubin was elevated at 3.1 with predominantly indirect hyperbilirubinemia.  Folic acid was normal at 6.6.  B12 levels were elevated at 1441.  Ferritin levels were elevated at 540 iron studies were normal.  Haptoglobin was less than 10.  Reticulocyte count was elevated at 10.6.  Myeloma panel showed no M protein.  LDH was mildly elevated at 256.  Coombs test was negative.  AFP was normal at 4.3.   Results of bone marrow biopsy from 02/09/2021 showed hypercellular bone marrow with erythroid proliferation.  Generally mild dyserythropoiesis.  No increase in blasts.  Suspect bone marrow changes likely secondary nature due to liver disease, medication and immune mediated process.  Early MDS is in the differential.  [employer] FISH panel for MDS did not show any evidence of abnormalities.  Cytogenetics normal.   Patient underwent EGD and colonoscopy in May 2022.  EGD showed gastric varices without bleeding and erythematous duodenopathy.  Colonoscopy showed 3 5 to 10 mm nonbleeding polyps in the descending transverse and ascending colon.    Interval history-patient reports doing well overall.  Energy levels are improved.  He remains independent of his ADLs.  No recent hospitalizations  ECOG PS- 2 Pain scale-  0   Review of systems- Review of Systems  Constitutional:  Negative for chills, fever, malaise/fatigue and weight loss.  HENT:  Negative for congestion, ear discharge and nosebleeds.   Eyes:  Negative for blurred vision.  Respiratory:  Negative for cough, hemoptysis,  sputum production, shortness of breath and wheezing.   Cardiovascular:  Negative for chest pain, palpitations, orthopnea and claudication.  Gastrointestinal:  Negative for abdominal pain, blood in stool, constipation, diarrhea, heartburn, melena, nausea and vomiting.  Genitourinary:  Negative for dysuria, flank pain, frequency, hematuria and urgency.  Musculoskeletal:  Negative for back pain, joint pain and myalgias.  Skin:  Negative for rash.  Neurological:  Negative for dizziness, tingling, focal weakness, seizures, weakness and headaches.  Endo/Heme/Allergies:  Does not bruise/bleed easily.  Psychiatric/Behavioral:  Negative for depression and suicidal ideas. The patient does not have insomnia.      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   Squamous cell carcinoma of skin 05/21/2018   R mid volar forearm   Thyroid disease    UROLITHIASIS, HX OF 12/23/2007     Past Surgical History:  Procedure Laterality Date   ABOVE KNEE LEG AMPUTATION Right    Right femur fracture, 11/1994, resulted in right AKA 2006 after staph infection   COLONOSCOPY N/A 12/16/2020   Procedure: COLONOSCOPY;  Surgeon: Virgel Manifold, MD;  Location: [facility] ENDOSCOPY;  Service: Endoscopy;  Laterality: N/A;   ESOPHAGOGASTRODUODENOSCOPY N/A 12/16/2020   Procedure: ESOPHAGOGASTRODUODENOSCOPY (EGD);  Surgeon: Virgel Manifold, MD;  Location[facility] ENDOSCOPY;  Service: Endoscopy;  Laterality: N/A;     Social History   Socioeconomic History   Marital status: Widowed    Spouse name: Not on file   Number of children: Not on file   Years of education: Not on file   Highest education level: Not on file  Occupational History   Occupation: Disabled  Tobacco Use   Smoking status: Never   Smokeless tobacco: Never  Substance and Sexual Activity   Alcohol use: Not Currently    Alcohol/week: 0.0 standard drinks   Drug use: No   Sexual activity: Not on file  Other Topics Concern   Not on file  Social History Narrative   Widowed 1997   Lives alone   Lost a son in his arms in 66.    3 sons   Social Determinants of Health   Financial Resource Strain: Not on file  Food Insecurity: Not on file  Transportation Needs: Not on file  Physical Activity: Not on file  Stress: Not on file  Social Connections: Not on file  Intimate Partner Violence: Not on file    Family History  Problem Relation Age of Onset   Heart disease Mother    Lung cancer Mother 47   Heart attack Paternal Grandmother    Heart attack Paternal Grandfather    Liver cancer Neg Hx  Current Outpatient Medications:    ALPRAZolam (XANAX) 1 MG tablet, Take 1 mg by mouth 3 (three) times daily., Disp: , Rfl:    Cholecalciferol (VITAMIN D3) 10 MCG (400 UNIT) CAPS, Take 4 capsules by mouth daily., Disp: , Rfl:    folic acid (FOLVITE) 1 MG tablet, TAKE 1 TABLET(1 MG) BY MOUTH DAILY, Disp: 30 tablet, Rfl: 3   hydrochlorothiazide (HYDRODIURIL) 12.5 MG tablet, TAKE 1 TABLET(12.5 MG) BY MOUTH DAILY, Disp: 90 tablet, Rfl: 0   levothyroxine (SYNTHROID) 50 MCG tablet, TAKE 1 TABLET(50 MCG) BY MOUTH DAILY BEFORE BREAKFAST, Disp: 90 tablet, Rfl: 1   losartan (COZAAR) 100 MG tablet, TAKE 1 TABLET(100 MG) BY MOUTH DAILY, Disp: 90 tablet, Rfl: 1   lubiprostone (AMITIZA) 24 MCG capsule, Take 24 mcg by mouth daily as needed., Disp: , Rfl:    meloxicam (MOBIC) 15 MG tablet, Take 15 mg by mouth every morning., Disp: , Rfl:     morphine (MS CONTIN) 15 MG 12 hr tablet, Take 15 mg by mouth 2 (two) times daily as needed., Disp: , Rfl:    NARCAN 4 MG/0.1ML LIQD nasal spray kit, SPRAY 0.1 ML (4 MG) IN 1 NOSTRIL/ MAY REPEAT DOSE EVERY 2 3 MINUTES AS NEEDED ALTERNATING NOSTRILS, Disp: , Rfl:    oxycodone (ROXICODONE) 30 MG immediate release tablet, Take 1 tablet by mouth 5 (five) times daily as needed., Disp: , Rfl:    pantoprazole (PROTONIX) 40 MG tablet, Take 1 tablet (40 mg total) by mouth daily., Disp: 30 tablet, Rfl: 0   propranolol (INDERAL) 40 MG tablet, TAKE 1 TABLET(40 MG) BY MOUTH TWICE DAILY, Disp: 180 tablet, Rfl: 1   vitamin B-12 (CYANOCOBALAMIN) 1000 MCG tablet, Take 1 tablet (1,000 mcg total) by mouth daily., Disp: , Rfl:    Na Sulfate-K Sulfate-Mg Sulf (SUPREP BOWEL PREP KIT) 17.5-3.13-1.6 GM/177ML SOLN, Take 1 kit by mouth as directed., Disp: 354 mL, Rfl: 0  Physical exam:  Vitals:   08/23/21 0922  BP: (!) 157/76  Pulse: 72  Resp: 18  Temp: 98.6 F (37 C)  TempSrc: Tympanic  SpO2: 100%  Weight: 220 lb (99.8 kg)  Height: 6' (1.829 m)   Physical Exam Constitutional:      Comments: Sitting in a wheelchair.  Appears in no acute distress  Cardiovascular:     Rate and Rhythm: Normal rate and regular rhythm.     Heart sounds: Normal heart sounds.  Pulmonary:     Effort: Pulmonary effort is normal.     Breath sounds: Normal breath sounds.  Abdominal:     General: Bowel sounds are normal.     Palpations: Abdomen is soft.  Musculoskeletal:     Cervical back: Normal range of motion.     Comments: S/p left AKA  Skin:    General: Skin is warm and dry.  Neurological:     Mental Status: He is alert and oriented to person, place, and time.     CMP Latest Ref Rng & Units 05/22/2021  Glucose 70 - 99 mg/dL 159(H)  BUN 8 - 23 mg/dL 15  Creatinine 0.61 - 1.24 mg/dL 0.54(L)  Sodium 135 - 145 mmol/L 134(L)  Potassium 3.5 - 5.1 mmol/L 3.6  Chloride 98 - 111 mmol/L 100  CO2 22 - 32 mmol/L 27  Calcium 8.9  - 10.3 mg/dL 8.9  Total Protein 6.5 - 8.1 g/dL 7.2  Total Bilirubin 0.3 - 1.2 mg/dL 3.6(H)  Alkaline Phos 38 - 126 U/L 51  AST 15 - 41  U/L 17  ALT 0 - 44 U/L 14   CBC Latest Ref Rng & Units 08/22/2021  WBC 4.0 - 10.5 K/uL 3.0(L)  Hemoglobin 13.0 - 17.0 g/dL 10.1(L)  Hematocrit 39.0 - 52.0 % 30.3(L)  Platelets 150 - 400 K/uL 41(L)    Assessment and plan- Patient is a 71 y.o. male  with history of chronic normocytic anemia likely secondary to nonimmune extravascular hemolysis in the setting of cirrhosis.  He is here for routine follow-up  Patient's hemoglobin fluctuates anywhere between 6-10.  He has required intermittent blood transfusions in the past.  He has not required a blood transfusion in over 2 months now and his hemoglobin is up to 10.1.Platelets have remained stable between 30s to 40s and he has chronic leukopenia with a white count that fluctuates between 1.5-3.  We will continue to monitor his counts conservatively.  He is already had a bone marrow biopsy in the past.  Repeat CBCWith differential reticulocyte count haptoglobin and B12 levels in 3 in 6 months and I will see him back in 6 months   Visit Diagnosis 1. Other non-autoimmune hemolytic anemias ([place])      Dr. Randa Evens, MD, MPH [facility] [phone] 08/23/2021 11:16 AM

## 2021-10-12 ENCOUNTER — Other Ambulatory Visit: Payer: Self-pay | Admitting: Oncology

## 2021-10-27 ENCOUNTER — Other Ambulatory Visit: Payer: Self-pay | Admitting: Family

## 2021-11-14 ENCOUNTER — Other Ambulatory Visit: Payer: Self-pay | Admitting: Family

## 2021-11-14 DIAGNOSIS — I10 Essential (primary) hypertension: Secondary | ICD-10-CM

## 2021-11-21 ENCOUNTER — Inpatient Hospital Stay: Payer: Medicare Other | Attending: Oncology

## 2021-11-21 DIAGNOSIS — D649 Anemia, unspecified: Secondary | ICD-10-CM | POA: Diagnosis not present

## 2021-11-21 DIAGNOSIS — D696 Thrombocytopenia, unspecified: Secondary | ICD-10-CM | POA: Diagnosis not present

## 2021-11-21 DIAGNOSIS — D61818 Other pancytopenia: Secondary | ICD-10-CM | POA: Diagnosis not present

## 2021-11-21 LAB — RETICULOCYTES
Immature Retic Fract: 19 % — ABNORMAL HIGH (ref 2.3–15.9)
RBC.: 2.44 MIL/uL — ABNORMAL LOW (ref 4.22–5.81)
Retic Count, Absolute: 233.3 10*3/uL — ABNORMAL HIGH (ref 19.0–186.0)
Retic Ct Pct: 9.6 % — ABNORMAL HIGH (ref 0.4–3.1)

## 2021-11-21 LAB — IRON AND TIBC
Iron: 140 ug/dL (ref 45–182)
Saturation Ratios: 38 % (ref 17.9–39.5)
TIBC: 370 ug/dL (ref 250–450)
UIBC: 230 ug/dL

## 2021-11-21 LAB — CBC WITH DIFFERENTIAL/PLATELET
Abs Immature Granulocytes: 0.03 10*3/uL (ref 0.00–0.07)
Basophils Absolute: 0 10*3/uL (ref 0.0–0.1)
Basophils Relative: 1 %
Eosinophils Absolute: 0.1 10*3/uL (ref 0.0–0.5)
Eosinophils Relative: 3 %
HCT: 25.2 % — ABNORMAL LOW (ref 39.0–52.0)
Hemoglobin: 8.5 g/dL — ABNORMAL LOW (ref 13.0–17.0)
Immature Granulocytes: 1 %
Lymphocytes Relative: 34 %
Lymphs Abs: 0.9 10*3/uL (ref 0.7–4.0)
MCH: 35.1 pg — ABNORMAL HIGH (ref 26.0–34.0)
MCHC: 33.7 g/dL (ref 30.0–36.0)
MCV: 104.1 fL — ABNORMAL HIGH (ref 80.0–100.0)
Monocytes Absolute: 0.2 10*3/uL (ref 0.1–1.0)
Monocytes Relative: 6 %
Neutro Abs: 1.5 10*3/uL — ABNORMAL LOW (ref 1.7–7.7)
Neutrophils Relative %: 55 %
Platelets: 43 10*3/uL — ABNORMAL LOW (ref 150–400)
RBC: 2.42 MIL/uL — ABNORMAL LOW (ref 4.22–5.81)
RDW: 18.5 % — ABNORMAL HIGH (ref 11.5–15.5)
WBC: 2.6 10*3/uL — ABNORMAL LOW (ref 4.0–10.5)
nRBC: 0 % (ref 0.0–0.2)

## 2021-11-22 LAB — HAPTOGLOBIN: Haptoglobin: <10 mg/dL — ABNORMAL LOW (ref 34–355)

## 2021-12-14 ENCOUNTER — Other Ambulatory Visit: Payer: Self-pay | Admitting: Family

## 2021-12-14 DIAGNOSIS — E039 Hypothyroidism, unspecified: Secondary | ICD-10-CM

## 2021-12-14 NOTE — Telephone Encounter (Signed)
Refill sent.

## 2021-12-18 ENCOUNTER — Other Ambulatory Visit: Payer: Self-pay

## 2021-12-18 ENCOUNTER — Emergency Department (HOSPITAL_COMMUNITY): Payer: Medicare Other

## 2021-12-18 ENCOUNTER — Inpatient Hospital Stay (HOSPITAL_COMMUNITY): Payer: Medicare Other

## 2021-12-18 ENCOUNTER — Inpatient Hospital Stay (HOSPITAL_COMMUNITY)
Admission: EM | Admit: 2021-12-18 | Discharge: 2022-01-05 | DRG: 871 | Disposition: A | Discharge status: Skilled Nursing Facility | Payer: Medicare Other | Attending: Internal Medicine | Admitting: Internal Medicine

## 2021-12-18 ENCOUNTER — Encounter (HOSPITAL_COMMUNITY): Payer: Self-pay

## 2021-12-18 DIAGNOSIS — L270 Generalized skin eruption due to drugs and medicaments taken internally: Secondary | ICD-10-CM

## 2021-12-18 DIAGNOSIS — K2970 Gastritis, unspecified, without bleeding: Secondary | ICD-10-CM | POA: Diagnosis present

## 2021-12-18 DIAGNOSIS — E7220 Disorder of urea cycle metabolism, unspecified: Secondary | ICD-10-CM | POA: Diagnosis present

## 2021-12-18 DIAGNOSIS — R571 Hypovolemic shock: Secondary | ICD-10-CM | POA: Diagnosis present

## 2021-12-18 DIAGNOSIS — Z8249 Family history of ischemic heart disease and other diseases of the circulatory system: Secondary | ICD-10-CM

## 2021-12-18 DIAGNOSIS — L89890 Pressure ulcer of other site, unstageable: Secondary | ICD-10-CM | POA: Diagnosis not present

## 2021-12-18 DIAGNOSIS — G9341 Metabolic encephalopathy: Secondary | ICD-10-CM

## 2021-12-18 DIAGNOSIS — D62 Acute posthemorrhagic anemia: Secondary | ICD-10-CM | POA: Diagnosis present

## 2021-12-18 DIAGNOSIS — R6521 Severe sepsis with septic shock: Secondary | ICD-10-CM | POA: Diagnosis present

## 2021-12-18 DIAGNOSIS — D696 Thrombocytopenia, unspecified: Secondary | ICD-10-CM | POA: Diagnosis present

## 2021-12-18 DIAGNOSIS — Z85828 Personal history of other malignant neoplasm of skin: Secondary | ICD-10-CM

## 2021-12-18 DIAGNOSIS — I10 Essential (primary) hypertension: Secondary | ICD-10-CM | POA: Diagnosis present

## 2021-12-18 DIAGNOSIS — N5089 Other specified disorders of the male genital organs: Secondary | ICD-10-CM

## 2021-12-18 DIAGNOSIS — M79652 Pain in left thigh: Secondary | ICD-10-CM | POA: Diagnosis not present

## 2021-12-18 DIAGNOSIS — E872 Acidosis, unspecified: Secondary | ICD-10-CM | POA: Diagnosis present

## 2021-12-18 DIAGNOSIS — N179 Acute kidney failure, unspecified: Secondary | ICD-10-CM | POA: Diagnosis present

## 2021-12-18 DIAGNOSIS — Z79899 Other long term (current) drug therapy: Secondary | ICD-10-CM

## 2021-12-18 DIAGNOSIS — L8990 Pressure ulcer of unspecified site, unspecified stage: Secondary | ICD-10-CM | POA: Insufficient documentation

## 2021-12-18 DIAGNOSIS — Z20822 Contact with and (suspected) exposure to covid-19: Secondary | ICD-10-CM | POA: Diagnosis present

## 2021-12-18 DIAGNOSIS — Z87442 Personal history of urinary calculi: Secondary | ICD-10-CM

## 2021-12-18 DIAGNOSIS — E8770 Fluid overload, unspecified: Secondary | ICD-10-CM

## 2021-12-18 DIAGNOSIS — E875 Hyperkalemia: Secondary | ICD-10-CM | POA: Diagnosis not present

## 2021-12-18 DIAGNOSIS — K8042 Calculus of bile duct with acute cholecystitis without obstruction: Secondary | ICD-10-CM | POA: Diagnosis not present

## 2021-12-18 DIAGNOSIS — Z66 Do not resuscitate: Secondary | ICD-10-CM | POA: Diagnosis not present

## 2021-12-18 DIAGNOSIS — I248 Other forms of acute ischemic heart disease: Secondary | ICD-10-CM | POA: Diagnosis present

## 2021-12-18 DIAGNOSIS — K2980 Duodenitis without bleeding: Secondary | ICD-10-CM | POA: Diagnosis present

## 2021-12-18 DIAGNOSIS — Z8619 Personal history of other infectious and parasitic diseases: Secondary | ICD-10-CM

## 2021-12-18 DIAGNOSIS — R7989 Other specified abnormal findings of blood chemistry: Secondary | ICD-10-CM

## 2021-12-18 DIAGNOSIS — E8809 Other disorders of plasma-protein metabolism, not elsewhere classified: Secondary | ICD-10-CM | POA: Diagnosis present

## 2021-12-18 DIAGNOSIS — Z515 Encounter for palliative care: Secondary | ICD-10-CM | POA: Diagnosis not present

## 2021-12-18 DIAGNOSIS — K766 Portal hypertension: Secondary | ICD-10-CM | POA: Diagnosis present

## 2021-12-18 DIAGNOSIS — Z79890 Hormone replacement therapy (postmenopausal): Secondary | ICD-10-CM

## 2021-12-18 DIAGNOSIS — T796XXA Traumatic ischemia of muscle, initial encounter: Secondary | ICD-10-CM | POA: Diagnosis not present

## 2021-12-18 DIAGNOSIS — K7031 Alcoholic cirrhosis of liver with ascites: Secondary | ICD-10-CM | POA: Diagnosis not present

## 2021-12-18 DIAGNOSIS — D594 Other nonautoimmune hemolytic anemias: Secondary | ICD-10-CM

## 2021-12-18 DIAGNOSIS — I739 Peripheral vascular disease, unspecified: Secondary | ICD-10-CM | POA: Diagnosis not present

## 2021-12-18 DIAGNOSIS — K648 Other hemorrhoids: Secondary | ICD-10-CM | POA: Diagnosis present

## 2021-12-18 DIAGNOSIS — K921 Melena: Secondary | ICD-10-CM | POA: Diagnosis not present

## 2021-12-18 DIAGNOSIS — G894 Chronic pain syndrome: Secondary | ICD-10-CM | POA: Diagnosis present

## 2021-12-18 DIAGNOSIS — K7682 Hepatic encephalopathy: Secondary | ICD-10-CM | POA: Diagnosis present

## 2021-12-18 DIAGNOSIS — E871 Hypo-osmolality and hyponatremia: Secondary | ICD-10-CM | POA: Diagnosis present

## 2021-12-18 DIAGNOSIS — E785 Hyperlipidemia, unspecified: Secondary | ICD-10-CM | POA: Diagnosis present

## 2021-12-18 DIAGNOSIS — E860 Dehydration: Secondary | ICD-10-CM | POA: Diagnosis present

## 2021-12-18 DIAGNOSIS — E039 Hypothyroidism, unspecified: Secondary | ICD-10-CM | POA: Diagnosis present

## 2021-12-18 DIAGNOSIS — R579 Shock, unspecified: Secondary | ICD-10-CM | POA: Diagnosis not present

## 2021-12-18 DIAGNOSIS — T360X5A Adverse effect of penicillins, initial encounter: Secondary | ICD-10-CM | POA: Diagnosis not present

## 2021-12-18 DIAGNOSIS — Q130 Coloboma of iris: Secondary | ICD-10-CM

## 2021-12-18 DIAGNOSIS — I471 Supraventricular tachycardia, unspecified: Secondary | ICD-10-CM

## 2021-12-18 DIAGNOSIS — Z79891 Long term (current) use of opiate analgesic: Secondary | ICD-10-CM

## 2021-12-18 DIAGNOSIS — K5730 Diverticulosis of large intestine without perforation or abscess without bleeding: Secondary | ICD-10-CM | POA: Diagnosis present

## 2021-12-18 DIAGNOSIS — R400 Somnolence: Secondary | ICD-10-CM

## 2021-12-18 DIAGNOSIS — R778 Other specified abnormalities of plasma proteins: Secondary | ICD-10-CM

## 2021-12-18 DIAGNOSIS — R748 Abnormal levels of other serum enzymes: Secondary | ICD-10-CM

## 2021-12-18 DIAGNOSIS — R531 Weakness: Secondary | ICD-10-CM | POA: Diagnosis not present

## 2021-12-18 DIAGNOSIS — Z993 Dependence on wheelchair: Secondary | ICD-10-CM

## 2021-12-18 DIAGNOSIS — D735 Infarction of spleen: Secondary | ICD-10-CM

## 2021-12-18 DIAGNOSIS — L89626 Pressure-induced deep tissue damage of left heel: Secondary | ICD-10-CM | POA: Diagnosis not present

## 2021-12-18 DIAGNOSIS — M79605 Pain in left leg: Secondary | ICD-10-CM | POA: Diagnosis not present

## 2021-12-18 DIAGNOSIS — A419 Sepsis, unspecified organism: Principal | ICD-10-CM

## 2021-12-18 DIAGNOSIS — R5381 Other malaise: Secondary | ICD-10-CM

## 2021-12-18 DIAGNOSIS — K7040 Alcoholic hepatic failure without coma: Secondary | ICD-10-CM | POA: Diagnosis present

## 2021-12-18 DIAGNOSIS — Y92003 Bedroom of unspecified non-institutional (private) residence as the place of occurrence of the external cause: Secondary | ICD-10-CM

## 2021-12-18 DIAGNOSIS — I70261 Atherosclerosis of native arteries of extremities with gangrene, right leg: Secondary | ICD-10-CM | POA: Diagnosis present

## 2021-12-18 DIAGNOSIS — R21 Rash and other nonspecific skin eruption: Secondary | ICD-10-CM | POA: Diagnosis not present

## 2021-12-18 DIAGNOSIS — E878 Other disorders of electrolyte and fluid balance, not elsewhere classified: Secondary | ICD-10-CM | POA: Diagnosis not present

## 2021-12-18 DIAGNOSIS — F419 Anxiety disorder, unspecified: Secondary | ICD-10-CM | POA: Diagnosis present

## 2021-12-18 DIAGNOSIS — L89150 Pressure ulcer of sacral region, unstageable: Secondary | ICD-10-CM | POA: Diagnosis not present

## 2021-12-18 DIAGNOSIS — K819 Cholecystitis, unspecified: Secondary | ICD-10-CM | POA: Diagnosis not present

## 2021-12-18 DIAGNOSIS — Z791 Long term (current) use of non-steroidal anti-inflammatories (NSAID): Secondary | ICD-10-CM

## 2021-12-18 DIAGNOSIS — W06XXXA Fall from bed, initial encounter: Secondary | ICD-10-CM | POA: Diagnosis present

## 2021-12-18 DIAGNOSIS — R29898 Other symptoms and signs involving the musculoskeletal system: Secondary | ICD-10-CM

## 2021-12-18 DIAGNOSIS — B1920 Unspecified viral hepatitis C without hepatic coma: Secondary | ICD-10-CM | POA: Diagnosis present

## 2021-12-18 DIAGNOSIS — R627 Adult failure to thrive: Secondary | ICD-10-CM | POA: Diagnosis present

## 2021-12-18 DIAGNOSIS — Z89611 Acquired absence of right leg above knee: Secondary | ICD-10-CM

## 2021-12-18 DIAGNOSIS — R160 Hepatomegaly, not elsewhere classified: Secondary | ICD-10-CM

## 2021-12-18 DIAGNOSIS — G9340 Encephalopathy, unspecified: Secondary | ICD-10-CM

## 2021-12-18 DIAGNOSIS — D589 Hereditary hemolytic anemia, unspecified: Secondary | ICD-10-CM | POA: Diagnosis present

## 2021-12-18 DIAGNOSIS — Z881 Allergy status to other antibiotic agents status: Secondary | ICD-10-CM

## 2021-12-18 DIAGNOSIS — Z7189 Other specified counseling: Secondary | ICD-10-CM

## 2021-12-18 DIAGNOSIS — D599 Acquired hemolytic anemia, unspecified: Secondary | ICD-10-CM | POA: Diagnosis not present

## 2021-12-18 DIAGNOSIS — R52 Pain, unspecified: Secondary | ICD-10-CM | POA: Diagnosis not present

## 2021-12-18 DIAGNOSIS — K8062 Calculus of gallbladder and bile duct with acute cholecystitis without obstruction: Secondary | ICD-10-CM | POA: Diagnosis present

## 2021-12-18 DIAGNOSIS — R202 Paresthesia of skin: Secondary | ICD-10-CM | POA: Diagnosis not present

## 2021-12-18 DIAGNOSIS — K7030 Alcoholic cirrhosis of liver without ascites: Secondary | ICD-10-CM

## 2021-12-18 DIAGNOSIS — N529 Male erectile dysfunction, unspecified: Secondary | ICD-10-CM | POA: Diagnosis present

## 2021-12-18 DIAGNOSIS — M6282 Rhabdomyolysis: Secondary | ICD-10-CM

## 2021-12-18 DIAGNOSIS — I70222 Atherosclerosis of native arteries of extremities with rest pain, left leg: Secondary | ICD-10-CM | POA: Diagnosis not present

## 2021-12-18 DIAGNOSIS — R932 Abnormal findings on diagnostic imaging of liver and biliary tract: Secondary | ICD-10-CM | POA: Diagnosis not present

## 2021-12-18 DIAGNOSIS — K922 Gastrointestinal hemorrhage, unspecified: Secondary | ICD-10-CM

## 2021-12-18 LAB — CBC
HCT: 20.1 % — ABNORMAL LOW (ref 39.0–52.0)
Hemoglobin: 6.7 g/dL — CL (ref 13.0–17.0)
MCH: 32.8 pg (ref 26.0–34.0)
MCHC: 33.3 g/dL (ref 30.0–36.0)
MCV: 98.5 fL (ref 80.0–100.0)
Platelets: 58 10*3/uL — ABNORMAL LOW (ref 150–400)
RBC: 2.04 MIL/uL — ABNORMAL LOW (ref 4.22–5.81)
RDW: 19.9 % — ABNORMAL HIGH (ref 11.5–15.5)
WBC: 10.2 10*3/uL (ref 4.0–10.5)
nRBC: 0.2 % (ref 0.0–0.2)

## 2021-12-18 LAB — BLOOD GAS, VENOUS
Acid-Base Excess: 0.3 mmol/L (ref 0.0–2.0)
Bicarbonate: 23.5 mmol/L (ref 20.0–28.0)
O2 Saturation: 80.8 %
Patient temperature: 37
pCO2, Ven: 33 mmHg — ABNORMAL LOW (ref 44–60)
pH, Ven: 7.46 — ABNORMAL HIGH (ref 7.25–7.43)
pO2, Ven: 47 mmHg — ABNORMAL HIGH (ref 32–45)

## 2021-12-18 LAB — RESP PANEL BY RT-PCR (FLU A&B, COVID) ARPGX2
Influenza A by PCR: NEGATIVE
Influenza B by PCR: NEGATIVE
SARS Coronavirus 2 by RT PCR: NEGATIVE

## 2021-12-18 LAB — AMMONIA: Ammonia: 52 umol/L — ABNORMAL HIGH (ref 9–35)

## 2021-12-18 LAB — COMPREHENSIVE METABOLIC PANEL
ALT: 107 U/L — ABNORMAL HIGH (ref 0–44)
ALT: 545 U/L — ABNORMAL HIGH (ref 0–44)
AST: 495 U/L — ABNORMAL HIGH (ref 15–41)
AST: 2284 U/L — ABNORMAL HIGH (ref 15–41)
Albumin: 3.5 g/dL (ref 3.5–5.0)
Albumin: 2.9 g/dL — ABNORMAL LOW (ref 3.5–5.0)
Alkaline Phosphatase: 70 U/L (ref 38–126)
Alkaline Phosphatase: 66 U/L (ref 38–126)
Anion gap: >20 — ABNORMAL HIGH (ref 5–15)
Anion gap: 17 — ABNORMAL HIGH (ref 5–15)
BUN: 38 mg/dL — ABNORMAL HIGH (ref 8–23)
BUN: 42 mg/dL — ABNORMAL HIGH (ref 8–23)
CO2: 17 mmol/L — ABNORMAL LOW (ref 22–32)
CO2: 19 mmol/L — ABNORMAL LOW (ref 22–32)
Calcium: 8.1 mg/dL — ABNORMAL LOW (ref 8.9–10.3)
Calcium: 7.5 mg/dL — ABNORMAL LOW (ref 8.9–10.3)
Chloride: 96 mmol/L — ABNORMAL LOW (ref 98–111)
Chloride: 100 mmol/L (ref 98–111)
Creatinine, Ser: 1.7 mg/dL — ABNORMAL HIGH (ref 0.61–1.24)
Creatinine, Ser: 1.44 mg/dL — ABNORMAL HIGH (ref 0.61–1.24)
GFR, Estimated: 42 mL/min — ABNORMAL LOW (ref >60)
GFR, Estimated: 52 mL/min — ABNORMAL LOW (ref >60)
Glucose, Bld: 145 mg/dL — ABNORMAL HIGH (ref 70–99)
Glucose, Bld: 213 mg/dL — ABNORMAL HIGH (ref 70–99)
Potassium: 5.5 mmol/L — ABNORMAL HIGH (ref 3.5–5.1)
Potassium: 5.1 mmol/L (ref 3.5–5.1)
Sodium: 135 mmol/L (ref 135–145)
Sodium: 136 mmol/L (ref 135–145)
Total Bilirubin: 8.5 mg/dL — ABNORMAL HIGH (ref 0.3–1.2)
Total Bilirubin: 8.8 mg/dL — ABNORMAL HIGH (ref 0.3–1.2)
Total Protein: 7.4 g/dL (ref 6.5–8.1)
Total Protein: 6.1 g/dL — ABNORMAL LOW (ref 6.5–8.1)

## 2021-12-18 LAB — CBC WITH DIFFERENTIAL/PLATELET
Abs Immature Granulocytes: 0.15 10*3/uL — ABNORMAL HIGH (ref 0.00–0.07)
Basophils Absolute: 0 10*3/uL (ref 0.0–0.1)
Basophils Relative: 0 %
Eosinophils Absolute: 0 10*3/uL (ref 0.0–0.5)
Eosinophils Relative: 0 %
HCT: 12.5 % — ABNORMAL LOW (ref 39.0–52.0)
Hemoglobin: 4 g/dL — CL (ref 13.0–17.0)
Immature Granulocytes: 2 %
Lymphocytes Relative: 11 %
Lymphs Abs: 0.9 10*3/uL (ref 0.7–4.0)
MCH: 33.6 pg (ref 26.0–34.0)
MCHC: 32 g/dL (ref 30.0–36.0)
MCV: 105 fL — ABNORMAL HIGH (ref 80.0–100.0)
Monocytes Absolute: 0.5 10*3/uL (ref 0.1–1.0)
Monocytes Relative: 6 %
Neutro Abs: 6.5 10*3/uL (ref 1.7–7.7)
Neutrophils Relative %: 81 %
Platelets: 78 10*3/uL — ABNORMAL LOW (ref 150–400)
RBC: 1.19 MIL/uL — ABNORMAL LOW (ref 4.22–5.81)
RDW: 19.7 % — ABNORMAL HIGH (ref 11.5–15.5)
WBC: 8.1 10*3/uL (ref 4.0–10.5)
nRBC: 0.2 % (ref 0.0–0.2)

## 2021-12-18 LAB — LIPASE, BLOOD: Lipase: 30 U/L (ref 11–51)

## 2021-12-18 LAB — RETICULOCYTES
Immature Retic Fract: 22.2 % — ABNORMAL HIGH (ref 2.3–15.9)
RBC.: 2.06 MIL/uL — ABNORMAL LOW (ref 4.22–5.81)
Retic Count, Absolute: 86.9 10*3/uL (ref 19.0–186.0)
Retic Ct Pct: 4.2 % — ABNORMAL HIGH (ref 0.4–3.1)

## 2021-12-18 LAB — LACTIC ACID, PLASMA
Lactic Acid, Venous: >9 mmol/L (ref 0.5–1.9)
Lactic Acid, Venous: >9 mmol/L (ref 0.5–1.9)

## 2021-12-18 LAB — APTT: aPTT: 32 seconds (ref 24–36)

## 2021-12-18 LAB — TROPONIN I (HIGH SENSITIVITY)
Troponin I (High Sensitivity): 97 ng/L — ABNORMAL HIGH (ref <18)
Troponin I (High Sensitivity): 108 ng/L (ref <18)

## 2021-12-18 LAB — PROCALCITONIN: Procalcitonin: 2.03 ng/mL

## 2021-12-18 LAB — BRAIN NATRIURETIC PEPTIDE: B Natriuretic Peptide: 154 pg/mL — ABNORMAL HIGH (ref 0.0–100.0)

## 2021-12-18 LAB — PROTIME-INR
INR: 1.8 — ABNORMAL HIGH (ref 0.8–1.2)
Prothrombin Time: 20.9 seconds — ABNORMAL HIGH (ref 11.4–15.2)

## 2021-12-18 LAB — PREPARE RBC (CROSSMATCH)

## 2021-12-18 LAB — GLUCOSE, CAPILLARY: Glucose-Capillary: 59 mg/dL — ABNORMAL LOW (ref 70–99)

## 2021-12-18 LAB — POC OCCULT BLOOD, ED: Fecal Occult Bld: NEGATIVE

## 2021-12-18 LAB — BILIRUBIN, DIRECT: Bilirubin, Direct: 1.8 mg/dL — ABNORMAL HIGH (ref 0.0–0.2)

## 2021-12-18 LAB — LACTATE DEHYDROGENASE: LDH: 2998 U/L — ABNORMAL HIGH (ref 98–192)

## 2021-12-18 MED ORDER — DOCUSATE SODIUM 50 MG/5ML PO LIQD: 100.0000 mg | Freq: Two times a day (BID) | ORAL | Status: DC | PRN

## 2021-12-18 MED ORDER — LACTATED RINGERS IV BOLUS (SEPSIS)
1000.0000 mL | Freq: Once | INTRAVENOUS | Status: AC
  Administered 2021-12-18: 1000 mL via INTRAVENOUS

## 2021-12-18 MED ORDER — VANCOMYCIN HCL 2000 MG/400ML IV SOLN
2000.0000 mg | INTRAVENOUS | Status: AC
  Administered 2021-12-18: 2000 mg via INTRAVENOUS
  Filled 2021-12-18: qty 400

## 2021-12-18 MED ORDER — SODIUM CHLORIDE 0.9 % IV SOLN
2.0000 g | INTRAVENOUS | Status: AC
  Administered 2021-12-18: 2 g via INTRAVENOUS
  Filled 2021-12-18: qty 12.5

## 2021-12-18 MED ORDER — DEXTROSE 50 % IV SOLN: 12.5000 g | INTRAVENOUS | Status: AC

## 2021-12-18 MED ORDER — SODIUM CHLORIDE 0.9% IV SOLUTION: Freq: Once | INTRAVENOUS | Status: AC

## 2021-12-18 MED ORDER — SODIUM CHLORIDE 0.9 % IV SOLN
2.0000 g | Freq: Two times a day (BID) | INTRAVENOUS | Status: DC
  Administered 2021-12-19: 2 g via INTRAVENOUS
  Filled 2021-12-18: qty 12.5

## 2021-12-18 MED ORDER — DEXTROSE 50 % IV SOLN
INTRAVENOUS | Status: AC
  Filled 2021-12-18: qty 50

## 2021-12-18 MED ORDER — DOCUSATE SODIUM 100 MG PO CAPS: 100.0000 mg | ORAL_CAPSULE | Freq: Two times a day (BID) | ORAL | Status: DC | PRN

## 2021-12-18 MED ORDER — LACTULOSE 10 GM/15ML PO SOLN
20.0000 g | Freq: Two times a day (BID) | ORAL | Status: DC
  Administered 2021-12-18 – 2021-12-19 (×3): 20 g
  Filled 2021-12-18 (×3): qty 30

## 2021-12-18 MED ORDER — NOREPINEPHRINE 4 MG/250ML-% IV SOLN
0.0000 ug/min | INTRAVENOUS | Status: DC
  Administered 2021-12-18: 14 ug/min via INTRAVENOUS
  Filled 2021-12-18: qty 250

## 2021-12-18 MED ORDER — OXYCODONE HCL 5 MG PO TABS
15.0000 mg | ORAL_TABLET | Freq: Four times a day (QID) | ORAL | Status: DC | PRN
  Administered 2021-12-18 – 2021-12-19 (×4): 15 mg via ORAL
  Filled 2021-12-18 (×5): qty 3

## 2021-12-18 MED ORDER — VANCOMYCIN HCL 1250 MG/250ML IV SOLN: 1250.0000 mg | INTRAVENOUS | Status: DC

## 2021-12-18 MED ORDER — POLYETHYLENE GLYCOL 3350 17 G PO PACK: 17.0000 g | PACK | Freq: Every day | ORAL | Status: DC | PRN

## 2021-12-18 MED ORDER — OXYCODONE HCL 5 MG PO TABS: 20.0000 mg | ORAL_TABLET | Freq: Four times a day (QID) | ORAL | Status: DC | PRN

## 2021-12-18 MED ORDER — SODIUM CHLORIDE 0.9 % IV SOLN
INTRAVENOUS | Status: DC | PRN
Start: 2021-12-18 — End: 2021-12-22

## 2021-12-18 MED ORDER — LACTATED RINGERS IV BOLUS
1000.0000 mL | Freq: Once | INTRAVENOUS | Status: AC
  Administered 2021-12-18: 1000 mL via INTRAVENOUS

## 2021-12-18 MED ORDER — ONDANSETRON HCL 4 MG/2ML IJ SOLN
4.0000 mg | Freq: Once | INTRAMUSCULAR | Status: AC
  Administered 2021-12-18: 4 mg via INTRAVENOUS
  Filled 2021-12-18: qty 2

## 2021-12-18 MED ORDER — LACTULOSE 10 GM/15ML PO SOLN
20.0000 g | Freq: Two times a day (BID) | ORAL | Status: DC
Start: 2021-12-18 — End: 2021-12-18

## 2021-12-18 MED ORDER — CHLORHEXIDINE GLUCONATE CLOTH 2 % EX PADS
6.0000 | MEDICATED_PAD | Freq: Every day | CUTANEOUS | Status: DC
  Administered 2021-12-18 – 2021-12-21 (×4): 6 via TOPICAL

## 2021-12-18 MED ORDER — SODIUM CHLORIDE 0.9 % IV SOLN
250.0000 mL | INTRAVENOUS | Status: DC
Start: 2021-12-18 — End: 2021-12-18
  Administered 2021-12-18 (×2): 250 mL via INTRAVENOUS

## 2021-12-18 MED ORDER — PANTOPRAZOLE SODIUM 40 MG PO TBEC: 40.0000 mg | DELAYED_RELEASE_TABLET | Freq: Every day | ORAL | Status: DC

## 2021-12-18 MED ORDER — PANTOPRAZOLE 2 MG/ML SUSPENSION
40.0000 mg | Freq: Every day | ORAL | Status: DC
  Administered 2021-12-18 – 2021-12-19 (×2): 40 mg
  Filled 2021-12-18 (×2): qty 20

## 2021-12-18 MED ORDER — POLYETHYLENE GLYCOL 3350 17 G PO PACK
17.0000 g | PACK | Freq: Every day | ORAL | Status: DC | PRN
Start: 2021-12-18 — End: 2021-12-19

## 2021-12-18 MED ORDER — NOREPINEPHRINE 4 MG/250ML-% IV SOLN
2.0000 ug/min | INTRAVENOUS | Status: DC
  Administered 2021-12-18: 2 ug/min via INTRAVENOUS
  Filled 2021-12-18 (×2): qty 250

## 2021-12-18 MED ORDER — SODIUM CHLORIDE 0.9 % IV SOLN
10.0000 mL/h | Freq: Once | INTRAVENOUS | Status: AC
  Administered 2021-12-18: 10 mL/h via INTRAVENOUS

## 2021-12-18 NOTE — H&P (Signed)
NAME:  Derek, Hall MRN:  [id number], DOB:  16-Oct-1950, LOS: 0 ADMISSION DATE:  12/18/2021, CONSULTATION DATE:  12/18/21 REFERRING MD:  EDP, CHIEF COMPLAINT:  weakness and confusion   History of Present Illness:  [name] is a 71 y.o. 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  History supplemented by pt's son via phone, states that pt's brother also found him confused and on the ground the day before.  He is supposed to be taking daily medication for cirrhosis, but unsure what it is.  Pt lives independently with health aide, but concerns that he recently has not been able to care for himself.   He has history of variceal changes on [place], denies blood in stool or dark stools  On arrival to the ED pt was hypotensive and altered, labs significant for Hgb of 4.0, lactic acid >9, WBC 8k, Ast 495, ALT 107 and bilirubin 8.   He was given IVF, transfusion ordered and started on Levophed peripherally.  PCCM consulted for admission.  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   Significant Hospital Events: Including procedures, antibiotic start and stop dates in addition to other pertinent events   5/22 presented to [facility] altered and hypotensive, admit to PCCM  Interim History / Subjective:  As  above   Objective   Blood pressure (!) 150/36, pulse 74, temperature (!) 97.5 F (36.4 C), resp. rate (!) 33, SpO2 94 %.        Intake/Output Summary (Last 24 hours) at 12/18/2021 1654 Last data filed at 12/18/2021 1600 Gross per 24 hour  Intake 2025.55 ml  Output --  Net 2025.55 ml   There were no vitals filed for this visit.  General:  critically ill, jaundiced-appearing M asleep in bed HEENT: MM pink/moist, L pupil Coloboma, R pupils responsive to light Neuro: pt somnolent, but arousable he is confused and does not directly answer questions,  CV: s1s2 tachycardic, rrr, no m/r/g PULM:  decreased air movement bilateral bases, mildly tachypneic, no hypoxia on RA GI: soft, non-distended  Extremities: warm/dry, no edema  Skin: no rashes or lesions   Resolved Hospital Problem list     Assessment & Plan:    Shock, suspect combination of hypovolemic from acute on chronic anemia and possibly sepsis Acute Encephalopathy -admitted to ICU, 2 units PRBC's ordered -follow serial hgb, haptoglobin and LDH -monitor for signs of GIB -received 2L IVF -Levophed to maintain MAP >65 -continue broad spectrum abx with Vanc/Cefepime -follow cultures, check procal -check head CT as had unwitnessed fall -ammonia 50, sound like is on chronic lactulose in speaking with son, will resume bid and see if improves encephalopathy   History of Non-immune hemolytic anemia Chronic thrombocytopenia and leukopenia Follows with Dr. Janese Banks, last seen in Jan Underwent bone marrow bx 01/2021 with hypercellular bone marrow with erythroid proliferation, suspected secondary to liver disease, currently being monitored -add on LDH, haptoglobin and reticulocytes to initial  blood draw if able -consult hematology in the AM   AKI Hyperkalemia Creatinine 1.7, normal baseline Suspect secondary to volume depletion and sepsis -follow urine output and renal indices, avoid nephrotoxins -monitor BMP, treat K if  continues to rise  AGMA Suspect secondary to lactic acidosis  -follow BMP and lactic acid, check VBG  Cirrhosis Elevated Transaminases Hyperbilirubinemia New 6cm Liver lesion on [place] -no significant ascites, will see if creatinine improves in the AM and then proceed with CT abd/pelvis with or without contrast    Elevated BNP and troponin Suspect secondary to demand, no ischemic changes on EKG -check Echo, continue to trend troponin      Best Practice (right click and "Reselect all SmartList Selections" daily)   Diet/type: NPO feeding tube ordered DVT prophylaxis: SCD GI prophylaxis: PPI Lines: Central line Foley:  N/A Code Status:  full code Last date of multidisciplinary goals of care discussion [spoke with son, full code for now]  Labs   CBC: Recent Labs  Lab 12/18/21 1124  WBC 8.1  NEUTROABS 6.5  HGB 4.0*  HCT 12.5*  MCV 105.0*  PLT 78*    Basic Metabolic Panel: Recent Labs  Lab 12/18/21 1124  NA 135  K 5.5*  CL 96*  CO2 17*  GLUCOSE 145*  BUN 38*  CREATININE 1.70*  CALCIUM 8.1*   GFR: CrCl cannot be calculated (Unknown ideal weight.). Recent Labs  Lab 12/18/21 1124 12/18/21 1222 12/18/21 1545  WBC 8.1  --   --   LATICACIDVEN  --  >9.0* >9.0*    Liver Function Tests: Recent Labs  Lab 12/18/21 1124  AST 495*  ALT 107*  ALKPHOS 70  BILITOT 8.5*  PROT 7.4  ALBUMIN 3.5   Recent Labs  Lab 12/18/21 1124  LIPASE 30   Recent Labs  Lab 12/18/21 1222  AMMONIA 52*    ABG No results found for: PHART, PCO2ART, PO2ART, HCO3, TCO2, ACIDBASEDEF, O2SAT   Coagulation Profile: Recent Labs  Lab 12/18/21 1124  INR 1.8*    Cardiac Enzymes: No results for input(s): CKTOTAL, CKMB, CKMBINDEX, TROPONINI in the last 168 hours.  HbA1C: Hgb A1c MFr Bld  Date/Time Value Ref Range Status  11/13/2019 11:18 AM 3.4 (L) 4.6 - 6.5 % Final    Comment:    Glycemic Control Guidelines for People with Diabetes:Non Diabetic:  <6%Goal of Therapy:  <7%Additional Action Suggested:  >8%   04/08/2018 04:35 AM 3.6 (L) 4.8 - 5.6 % Final    Comment:    (NOTE) Pre diabetes:          5.7%-6.4% Diabetes:              >6.4% Glycemic control for   <7.0% adults with diabetes     CBG: Recent Labs  Lab 12/18/21 1544  GLUCAP 59*    Review of Systems:   Unable to obatin  Past Medical History:  He,  has a past medical history of AKA, RIGHT, HX OF (12/23/2007), ANXIETY (03/24/2007), Chronic pain, CIRRHOSIS (03/24/2007), ERECTILE DYSFUNCTION, 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   Surgical History:   Past Surgical History:  Procedure Laterality Date   ABOVE KNEE LEG AMPUTATION Right    Right femur fracture, 11/1994, resulted in right AKA 2006 after staph infection   COLONOSCOPY N/A 12/16/2020   Procedure: COLONOSCOPY;  Surgeon: Virgel Manifold, MD;  Location: [facility] ENDOSCOPY;  Service: Endoscopy;  Laterality: N/A;   ESOPHAGOGASTRODUODENOSCOPY N/A 12/16/2020   Procedure: ESOPHAGOGASTRODUODENOSCOPY (EGD);  Surgeon: Virgel Manifold, MD;  Location[facility] ENDOSCOPY;  Service: Endoscopy;  Laterality: N/A;     Social History:   reports that he has never smoked. He has never used smokeless tobacco. He reports that he does not currently use alcohol. He reports that he does not use drugs.   Family History:  His family history includes Heart attack in his paternal grandfather and paternal grandmother; Heart disease in his mother; Lung cancer (age of onset: 44) in his mother. There is no history of Liver cancer.   Allergies Allergies  Allergen Reactions   Clindamycin Dermatitis     Home Medications  Prior to Admission medications   Medication Sig Start Date End Date Taking? Authorizing  Provider  ALPRAZolam Duanne Moron) 1 MG tablet Take 1 mg by mouth 3 (three) times daily.   Yes [provider]  Cholecalciferol (VITAMIN D3) 10 MCG (400 UNIT) CAPS Take 4 capsules by mouth daily.   Yes [provider]  folic acid (FOLVITE) 1 MG tablet TAKE 1 TABLET(1 MG) BY MOUTH DAILY Patient taking differently: Take 1 mg by mouth daily. 10/12/21  Yes Burns, Wandra Feinstein, NP  hydrochlorothiazide (HYDRODIURIL) 12.5 MG tablet TAKE 1 TABLET(12.5 MG) BY MOUTH DAILY Patient taking differently: Take 12.5 mg by mouth daily. 10/27/21  Yes [name], FNP  levothyroxine (SYNTHROID) 50 MCG tablet TAKE 1 TABLET(50 MCG) BY MOUTH DAILY BEFORE BREAKFAST Patient taking differently: Take 50 mcg by mouth daily before breakfast. 12/14/21  Yes Arnett, [name], FNP  losartan (COZAAR) 100 MG tablet TAKE 1 TABLET(100 MG) BY MOUTH DAILY Patient taking differently: Take 100 mg by mouth daily. 11/14/21  Yes Arnett, [name], FNP  lubiprostone (AMITIZA) 24 MCG capsule Take 24 mcg by mouth daily as needed. 08/13/14  Yes [provider]  meloxicam (MOBIC) 15 MG tablet Take 15 mg by mouth every morning. 01/12/21  Yes [provider]  morphine (MS CONTIN) 15 MG 12 hr tablet Take 15 mg by mouth 2 (two) times daily as needed for pain. 04/06/20  Yes [provider]  Oxycodone HCl 20 MG TABS Take 20 mg by mouth 4 (four) times daily. 12/04/21  Yes [provider]  pantoprazole (PROTONIX) 40 MG tablet Take 1 tablet (40 mg total) by mouth daily. 12/18/20 12/18/21 Yes Sharen Hones, MD  vitamin B-12 (CYANOCOBALAMIN) 1000 MCG tablet Take 1 tablet (1,000 mcg total) by mouth daily. 04/09/18  Yes Bettey Costa, MD  NARCAN 4 MG/0.1ML LIQD nasal spray kit Place 1 spray into the nose once. 12/29/18   [provider]     Critical care time: 55 minutes     CRITICAL CARE Performed by: Otilio Carpen Mcgrail   Total critical care time: 55 minutes  Critical care time was exclusive of  separately billable procedures and treating other patients.  Critical care was necessary to treat or prevent imminent or life-threatening deterioration.  Critical care was time spent personally by me on the following activities: development of treatment plan with patient and/or surrogate as well as nursing, discussions with consultants, evaluation of patient's response to treatment, examination of patient, obtaining history from patient or surrogate, ordering and performing treatments and interventions, ordering and review of laboratory studies, ordering and review of radiographic studies, pulse oximetry and re-evaluation of patient's condition.  Otilio Carpen Soper, PA-C [facility] See Amion for pager If no  response to pager , please call [phone] until 7pm After 7:00 pm call Elink  [phone]

## 2021-12-18 NOTE — Progress Notes (Signed)
Pharmacy Antibiotic Note  [name]. is a 71 y.o. male admitted on 12/18/2021 with shock, encephalopathy, severe anemia, and AKI. Pharmacy has been consulted for Vancomycin and Cefepime dosing due to concern for sepsis.  Plan: Vancomycin 2g IV x 1, then '1250mg'$  IV q24h Vancomycin levels at steady state, as indicated Cefepime 2g IV q12h Monitor renal function, cultures, clinical course  Height: 6' (182.9 cm) Weight: 88.8 kg (195 lb 12.3 oz) IBW/kg (Calculated) : 77.6  Temp (24hrs), Avg:97.7 F (36.5 C), Min:97.2 F (36.2 C), Max:98.3 F (36.8 C)  Recent Labs  Lab 12/18/21 1124 12/18/21 1222 12/18/21 1545  WBC 8.1  --   --   CREATININE 1.70*  --   --   LATICACIDVEN  --  >9.0* >9.0*    Estimated Creatinine Clearance: 43.1 mL/min (A) (by C-G formula based on SCr of 1.7 mg/dL (H)).    Allergies  Allergen Reactions   Clindamycin Dermatitis    Antimicrobials this admission: 5/22 Vancomycin >> 5/22 Cefepime >>  Dose adjustments this admission: --  Microbiology results: 5/22 BCx:  5/22 UCx:   5/22 MRSA PCR:   Thank you for allowing pharmacy to be a part of this patient's care.   Lindell Spar, PharmD, BCPS Clinical Pharmacist  12/18/2021 7:47 PM

## 2021-12-18 NOTE — Plan of Care (Signed)
  Interdisciplinary Goals of Care Family Meeting   Date carried out:: 12/18/2021  Location of the meeting: Phone conference  Member's involved: Family Member or next of kin and Other: physician assistant  Durable Power of Attorney or acting medical decision maker: Son [name]    Discussion: We discussed goals of care for [name]. .   Pt's son [name] expressed concern that patient is having trouble caring for himself, relayed current lab findings and that I am concerned pt is very ill and may be heading towards life support.  [name] relayed that [name] had clearly stated he wanted everything done, but did not want to live on life support for a prolonged period of time.  [name] will be travelling to [facility] tomorrow and will continue [name] discussions depending on how pt's course unfolds over the next day.  Continue full code  Code status: Full Code  Disposition: Continue current acute care   Time spent for the meeting:  15 minutes  [name] 12/18/2021, 5:51 PM

## 2021-12-18 NOTE — Progress Notes (Signed)
[facility] Progress Note Patient Name: [name] [name]. DOB: 06/10/1951 MRN: [id number]   Date of Service  12/18/2021  HPI/Events of Note  S/p PRBC x 2 Post-transfusion Hg 6.7  On levophed 14 with BP 140s/50   eICU Interventions  Transfuse PRBC x 1 Wean levophed for SBP goal >100. Parameters adjusted     Intervention Category Intermediate Interventions: Coagulopathy - evaluation and management  Benett Swoyer Rodman Pickle 12/18/2021, 11:21 PM

## 2021-12-18 NOTE — Procedures (Signed)
Central Venous Catheter Insertion Procedure Note  [name]  [id number]  1951/04/13  Date:12/18/21  Time:4:56 PM   Provider Performing:Blenda Wisecup R Deluna   Procedure: Insertion of Non-tunneled Central Venous [phone]) with US guidance (94709)   Indication(s) Medication administration  Consent Risks of the procedure as well as the alternatives and risks of each were explained to the patient and/or caregiver.  Consent for the procedure was obtained and is signed in the bedside chart (spoke with son via telephone)  Anesthesia Topical only with 1% lidocaine   Timeout Verified patient identification, verified procedure, site/side was marked, verified correct patient position, special equipment/implants available, medications/allergies/relevant history reviewed, required imaging and test results available.  Sterile Technique Maximal sterile technique including full sterile barrier drape, hand hygiene, sterile gown, sterile gloves, mask, hair covering, sterile ultrasound probe cover (if used).  Procedure Description Area of catheter insertion was cleaned with chlorhexidine and draped in sterile fashion.  With real-time ultrasound guidance a central venous catheter was placed into the right internal jugular vein. Nonpulsatile blood flow and easy flushing noted in all ports.  The catheter was sutured in place and sterile dressing applied.  Complications/Tolerance None; patient tolerated the procedure well. Chest X-ray is ordered to verify placement for internal jugular or subclavian cannulation.   Chest x-ray is not ordered for femoral cannulation.  EBL Minimal  Specimen(s) None  Derek Carpen Dansby, PA-C

## 2021-12-18 NOTE — Progress Notes (Signed)
RT NOTE:  RT attempted x3 to place a-line and was unsuccessful. RN and MD are aware.

## 2021-12-18 NOTE — ED Provider Notes (Signed)
[facility] DEPT Provider Note   CSN: [id number] Arrival date & time: 12/18/21  1053     History  Chief Complaint  Patient presents with   Failure To Thrive    Derek Anon Cominsky [name]. is a 71 y.o. male.  He has a history of cirrhosis, right AKA, hemolytic anemia pancytopenia.  He is usually wheelchair-bound but lives independently and has been able to transfer.  He is brought in by ambulance from home due to failure to thrive.  He said he has been nauseous for the last few days and has not been able to eat.  Generally weak.  Denies any pain.  Denies vomiting.   The history is provided by the patient and the EMS personnel.  Weakness Severity:  Moderate Onset quality:  Gradual Duration:  3 days Timing:  Constant Progression:  Worsening Chronicity:  New Relieved by:  Nothing Worsened by:  Activity Ineffective treatments:  Rest Associated symptoms: nausea   Associated symptoms: no abdominal pain, no chest pain, no cough, no fever, no foul-smelling urine, no headaches, no shortness of breath, no stroke symptoms and no vomiting   Risk factors: anemia       Home Medications Prior to Admission medications   Medication Sig Start Date End Date Taking? Authorizing Provider  folic acid (FOLVITE) 1 MG tablet TAKE 1 TABLET(1 MG) BY MOUTH DAILY 10/12/21   Jacquelin Hawking, NP  ALPRAZolam Duanne Moron) 1 MG tablet Take 1 mg by mouth 3 (three) times daily.    [provider]  Cholecalciferol (VITAMIN D3) 10 MCG (400 UNIT) CAPS Take 4 capsules by mouth daily.    [provider]  hydrochlorothiazide (HYDRODIURIL) 12.5 MG tablet TAKE 1 TABLET(12.5 MG) BY MOUTH DAILY 10/27/21   [name], FNP  levothyroxine (SYNTHROID) 50 MCG tablet TAKE 1 TABLET(50 MCG) BY MOUTH DAILY BEFORE BREAKFAST 12/14/21   [name], FNP  losartan (COZAAR) 100 MG tablet TAKE 1 TABLET(100 MG) BY MOUTH DAILY 11/14/21   [name], FNP  lubiprostone (AMITIZA) 24  MCG capsule Take 24 mcg by mouth daily as needed. 08/13/14   [provider]  meloxicam (MOBIC) 15 MG tablet Take 15 mg by mouth every morning. 01/12/21   [provider]  morphine (MS CONTIN) 15 MG 12 hr tablet Take 15 mg by mouth 2 (two) times daily as needed. 04/06/20   [provider]  Na Sulfate-K Sulfate-Mg Sulf (SUPREP BOWEL PREP KIT) 17.5-3.13-1.6 GM/177ML SOLN Take 1 kit by mouth as directed. 01/31/21   Lucilla Lame, MD  NARCAN 4 MG/0.1ML LIQD nasal spray kit SPRAY 0.1 ML (4 MG) IN 1 NOSTRIL/ MAY REPEAT DOSE EVERY 2 3 MINUTES AS NEEDED ALTERNATING NOSTRILS 12/29/18   [provider]  oxycodone (ROXICODONE) 30 MG immediate release tablet Take 1 tablet by mouth 5 (five) times daily as needed. 03/24/18   [provider]  pantoprazole (PROTONIX) 40 MG tablet Take 1 tablet (40 mg total) by mouth daily. 12/18/20 08/23/21  Sharen Hones, MD  propranolol (INDERAL) 40 MG tablet TAKE 1 TABLET(40 MG) BY MOUTH TWICE DAILY 06/26/21   [name], FNP  vitamin B-12 (CYANOCOBALAMIN) 1000 MCG tablet Take 1 tablet (1,000 mcg total) by mouth daily. 04/09/18   Bettey Costa, MD      Allergies    Clindamycin    Review of Systems   Review of Systems  Constitutional:  Positive for fatigue. Negative for fever.  HENT:  Negative for sore throat.  Eyes:  Negative for visual disturbance.  Respiratory:  Negative for cough and shortness of breath.   Cardiovascular:  Negative for chest pain.  Gastrointestinal:  Positive for nausea. Negative for abdominal pain and vomiting.  Skin:  Negative for rash.  Neurological:  Positive for weakness. Negative for headaches.   Physical Exam Updated Vital Signs BP 109/65 (BP Location: Left Arm)   Pulse 73   Temp 98.3 F (36.8 C) (Rectal)   Resp 20   SpO2 100%  Physical Exam Vitals and nursing note reviewed.  Constitutional:      General: He is not in acute distress.    Appearance: Normal appearance. He is well-developed.   HENT:     Head: Normocephalic and atraumatic.  Eyes:     Conjunctiva/sclera: Conjunctivae normal.  Cardiovascular:     Rate and Rhythm: Normal rate and regular rhythm.     Heart sounds: No murmur heard. Pulmonary:     Effort: Pulmonary effort is normal. No respiratory distress.     Breath sounds: Normal breath sounds.  Abdominal:     Palpations: Abdomen is soft.     Tenderness: There is no abdominal tenderness. There is no guarding or rebound.  Musculoskeletal:        General: No swelling.     Cervical back: Neck supple.     Comments: Right AKA  Skin:    General: Skin is warm and dry.     Capillary Refill: Capillary refill takes less than 2 seconds.  Neurological:     General: No focal deficit present.     Mental Status: He is alert and oriented to person, place, and time.     Comments: Patient very slow to respond but awake and oriented    ED Results / Procedures / Treatments   Labs (all labs ordered are listed, but only abnormal results are displayed) Labs Reviewed  LACTIC ACID, PLASMA - Abnormal; Notable for the following components:      Result Value   Lactic Acid, Venous >9.0 (*)    All other components within normal limits  COMPREHENSIVE METABOLIC PANEL - Abnormal; Notable for the following components:   Potassium 5.5 (*)    Chloride 96 (*)    CO2 17 (*)    Glucose, Bld 145 (*)    BUN 38 (*)    Creatinine, Ser 1.70 (*)    Calcium 8.1 (*)    AST 495 (*)    ALT 107 (*)    Total Bilirubin 8.5 (*)    GFR, Estimated 42 (*)    Anion gap >20 (*)    All other components within normal limits  CBC WITH DIFFERENTIAL/PLATELET - Abnormal; Notable for the following components:   RBC 1.19 (*)    Hemoglobin 4.0 (*)    HCT 12.5 (*)    MCV 105.0 (*)    RDW 19.7 (*)    Platelets 78 (*)    Abs Immature Granulocytes 0.15 (*)    All other components within normal limits  PROTIME-INR - Abnormal; Notable for the following components:   Prothrombin Time 20.9 (*)    INR 1.8  (*)    All other components within normal limits  BRAIN NATRIURETIC PEPTIDE - Abnormal; Notable for the following components:   B Natriuretic Peptide 154.0 (*)    All other components within normal limits  AMMONIA - Abnormal; Notable for the following components:   Ammonia 52 (*)    All other components within normal limits  BILIRUBIN, DIRECT -  Abnormal; Notable for the following components:   Bilirubin, Direct 1.8 (*)    All other components within normal limits  TROPONIN I (HIGH SENSITIVITY) - Abnormal; Notable for the following components:   Troponin I (High Sensitivity) 97 (*)    All other components within normal limits  RESP PANEL BY RT-PCR (FLU A&B, COVID) ARPGX2  CULTURE, BLOOD (ROUTINE X 2)  CULTURE, BLOOD (ROUTINE X 2)  URINE CULTURE  APTT  LIPASE, BLOOD  LACTIC ACID, PLASMA  URINALYSIS, ROUTINE W REFLEX MICROSCOPIC  POC OCCULT BLOOD, ED  TYPE AND SCREEN  PREPARE RBC (CROSSMATCH)    EKG EKG Interpretation  Date/Time:  Monday Dec 18 2021 11:01:29 EDT Ventricular Rate:  75 PR Interval:  124 QRS Duration: 115 QT Interval:  421 QTC Calculation: 471 R Axis:   2 Text Interpretation: Sinus rhythm Nonspecific intraventricular conduction delay No significant change since prior 5/21 Confirmed by Aletta Edouard [phone]) on 12/18/2021 12:30:50 PM  Radiology DG CHEST PORT 1 VIEW  Result Date: 12/18/2021 CLINICAL DATA:  Central line placement EXAM: PORTABLE CHEST 1 VIEW COMPARISON:  12/18/2021 FINDINGS: Right-sided central venous catheter tip over the SVC. No pneumothorax. No focal opacity, or pleural effusion. Stable borderline cardiomegaly. IMPRESSION: 1. Right IJ central venous catheter tip over the SVC. No pneumothorax 2. Clear lung fields Electronically Signed   By: Donavan Foil M.D.   On: 12/18/2021 16:56   DG Chest Port 1 View  Result Date: 12/18/2021 CLINICAL DATA:  Questionable sepsis. EXAM: PORTABLE CHEST 1 VIEW COMPARISON:  Dec 21, 2019 FINDINGS: Likely  projectional prominence of the cardiac silhouette and vascular pedicle. Aortic atherosclerosis. No focal airspace consolidation. No visible pleural effusion or pneumothorax. No acute abnormality. IMPRESSION: No focal airspace consolidation or visible pleural effusion. Electronically Signed   By: Dahlia Bailiff M.D.   On: 12/18/2021 12:06   US Abdomen Limited RUQ (LIVER/GB)  Result Date: 12/18/2021 CLINICAL DATA:  A 71 year old male presents for evaluation of elevated liver function tests. EXAM: ULTRASOUND ABDOMEN LIMITED RIGHT UPPER QUADRANT COMPARISON:  Imaging from January 25, 2021. FINDINGS: Gallbladder: Gallbladder not well assessed due to limitations with patient positioning and sonographic window. Question of gallbladder wall thickening. The gallbladder is not imaged in its entirety Common bile duct: Diameter: 3.3 mm though not well assessed due to patient positioning. Liver: Heterogeneous hepatic echotexture with lobular hepatic contours. Question of liver mass seen potentially along the LEFT hepatic lobe though not well assessed. Not definitively associated with the liver on some images. Again with limited assessment as this is deeper in the abdomen, not along the superficial hepatic margin. This may measure up to 6.5 x 6.4 x 4.8 cm. Portal vein is patent on color Doppler imaging with normal direction of blood flow towards the liver. Other: Again markedly limited assessment due to difficulty in positioning the patient for the evaluation. IMPRESSION: Very limited evaluation. Gallbladder is not fully assessed with signs of mild thickening along visualized portions. Liver with suspected mass measuring greater than 6 cm. Suggest further evaluation with contrasted CT, if intravenous contrast cannot be administered noncontrast CT would likely be of benefit to exclude acute process and guide further workup/management. Electronically Signed   By: Zetta Bills M.D.   On: 12/18/2021 14:00    Procedures .Critical  Care Performed by: Hayden Rasmussen, MD Authorized by: Hayden Rasmussen, MD   Critical care provider statement:    Critical care time (minutes):  45   Critical care time was exclusive of:  Separately billable  procedures and treating other patients   Critical care was necessary to treat or prevent imminent or life-threatening deterioration of the following conditions:  Dehydration and shock   Critical care was time spent personally by me on the following activities:  Development of treatment plan with patient or surrogate, discussions with consultants, evaluation of patient's response to treatment, examination of patient, obtaining history from patient or surrogate, ordering and performing treatments and interventions, ordering and review of laboratory studies, ordering and review of radiographic studies, pulse oximetry, re-evaluation of patient's condition and review of old charts   I assumed direction of critical care for this patient from another provider in my specialty: no      Medications Ordered in ED Medications  docusate sodium (COLACE) capsule 100 mg (has no administration in time range)  polyethylene glycol (MIRALAX / GLYCOLAX) packet 17 g (has no administration in time range)  0.9 %  sodium chloride infusion (250 mLs Intravenous New Bag/Given 12/18/21 1551)  norepinephrine (LEVOPHED) 74m in 2525m(0.016 mg/mL) premix infusion (5 mcg/min Intravenous Infusion Verify 12/18/21 1502)  Chlorhexidine Gluconate Cloth 2 % PADS 6 each (6 each Topical Given 12/18/21 1550)  0.9 %  sodium chloride infusion (has no administration in time range)  lactulose (CHRONULAC) 10 GM/15ML solution 20 g (has no administration in time range)  pantoprazole (PROTONIX) EC tablet 40 mg (has no administration in time range)  ceFEPIme (MAXIPIME) 2 g in sodium chloride 0.9 % 100 mL IVPB (has no administration in time range)  vancomycin (VANCOREADY) IVPB 2000 mg/400 mL (has no administration in time range)  lactated  ringers bolus 1,000 mL (0 mLs Intravenous Stopped 12/18/21 1421)  ondansetron (ZOFRAN) injection 4 mg (4 mg Intravenous Given 12/18/21 1150)  0.9 %  sodium chloride infusion (10 mL/hr Intravenous New Bag/Given 12/18/21 1550)  lactated ringers bolus 1,000 mL (1,000 mLs Intravenous New Bag/Given 12/18/21 1424)  dextrose 50 % solution 12.5 g ( Intravenous Given 12/18/21 1550)    ED Course/ Medical Decision Making/ A&P Clinical Course as of 12/18/21 1359  Mon Dec 18, 2021  1137 Chest x-ray interpreted by me as no acute infiltrates.  Awaiting radiology reading. [MB]  [id number] notification from nurse that patient's hemoglobin is 4.  2 units packed red blood cells ordered and type and screen ordered.  Rectal exam done with nurse Keri as chaperone.  Decreased tone, green stool in vault.  Sent to lab for guaiac. [MB]  [id number] with critical care who will evaluate the patient for possible admission versus recommendations. [MB]    Clinical Course User Index [MB] BuHayden RasmussenMD                           Medical Decision Making Amount and/or Complexity of Data Reviewed Labs: ordered. Radiology: ordered.  Risk Prescription drug management. Decision regarding hospitalization.  This patient complains of generalized weakness and lethargy; this involves an extensive number of treatment Options and is a complaint that carries with it a high risk of complications and morbidity. The differential includes encephalopathy, liver failure, anemia, GI bleed, infection, sepsis, hypovolemia, renal failure, metabolic derangement  I ordered, reviewed and interpreted labs, which included CBC with normal white count, hemoglobin critically low, platelets low stable from priors, chemistries with mild elevated potassium, new BUN and creatinine elevation, elevated LFTs and bilirubin, elevated gap, troponins elevated need to be trended, lactate critically elevated I ordered medication IV fluids nausea  medication and reviewed PMP  when indicated. I ordered imaging studies which included chest x-ray and right upper quadrant ultrasound and I independently    visualized and interpreted imaging which showed no acute infiltrates.  No acute cholecystitis but does have a liver mass Additional history obtained from EMS Previous records obtained and reviewed in epic including prior oncology notes I consulted critical care and discussed lab and imaging findings and discussed disposition.  Cardiac monitoring reviewed, normal sinus rhythm Social determinants considered, no significant barriers Critical Interventions: Initiation of fluids and transfusion of blood products for critical anemia hypotension  After the interventions stated above, I reevaluated the patient and found patient still to be critically ill Admission and further testing considered, he would benefit from admission to the hospital for further management.  Appreciate critical care being involved          Final Clinical Impression(s) / ED Diagnoses Final diagnoses:  Hypovolemic shock ([place])  Acute encephalopathy  AKI (acute kidney injury) ([place])    Rx / DC Orders ED Discharge Orders     None         Hayden Rasmussen, MD 12/18/21 [phone]

## 2021-12-18 NOTE — ED Triage Notes (Signed)
Pt. [name]. Pt. Has exhibited signs of failure to thrive, pt's family endorses that Pt. Is wheelchair bound and mobility has decreased. PT's family also endorses that Pt.  has common jaundice that has worsened. Pt,. Arrived fecal incontinent.

## 2021-12-18 NOTE — Progress Notes (Signed)
[facility] Progress Note Patient Name: [name]. DOB: August 20, 1950 MRN: [id number]   Date of Service  12/18/2021  HPI/Events of Note  Patient reports 10/10 pain. On chronic meds including MS contin and oxycodone 20 mg QID Verified NGT placement  eICU Interventions  Ok to use NGT PRN oxycodone 15 mg ordered     Intervention Category Intermediate Interventions: Pain - evaluation and management Minor Interventions: Routine modifications to care plan (e.g. PRN medications for pain, fever) Evaluation Type: New Patient Evaluation  [name] Rodman Pickle 12/18/2021, 10:19 PM

## 2021-12-19 ENCOUNTER — Encounter (HOSPITAL_COMMUNITY): Payer: Self-pay | Admitting: Pulmonary Disease

## 2021-12-19 ENCOUNTER — Inpatient Hospital Stay (HOSPITAL_COMMUNITY): Payer: Medicare Other

## 2021-12-19 ENCOUNTER — Other Ambulatory Visit: Payer: Self-pay

## 2021-12-19 DIAGNOSIS — R579 Shock, unspecified: Secondary | ICD-10-CM | POA: Diagnosis not present

## 2021-12-19 DIAGNOSIS — R778 Other specified abnormalities of plasma proteins: Secondary | ICD-10-CM | POA: Diagnosis not present

## 2021-12-19 DIAGNOSIS — I10 Essential (primary) hypertension: Secondary | ICD-10-CM | POA: Diagnosis not present

## 2021-12-19 LAB — ECHOCARDIOGRAM COMPLETE
AR max vel: 2.28 cm2
AV Peak grad: 8.5 mmHg
Ao pk vel: 1.46 m/s
Area-P 1/2: 3.63 cm2
Height: 72 in
S' Lateral: 3.3 cm
Weight: 3139.35 oz

## 2021-12-19 LAB — CBC
HCT: 22.1 % — ABNORMAL LOW (ref 39.0–52.0)
HCT: 21.3 % — ABNORMAL LOW (ref 39.0–52.0)
Hemoglobin: 7.1 g/dL — ABNORMAL LOW (ref 13.0–17.0)
Hemoglobin: 7.2 g/dL — ABNORMAL LOW (ref 13.0–17.0)
MCH: 31.1 pg (ref 26.0–34.0)
MCH: 32.3 pg (ref 26.0–34.0)
MCHC: 32.1 g/dL (ref 30.0–36.0)
MCHC: 33.8 g/dL (ref 30.0–36.0)
MCV: 96.9 fL (ref 80.0–100.0)
MCV: 95.5 fL (ref 80.0–100.0)
Platelets: 40 10*3/uL — ABNORMAL LOW (ref 150–400)
Platelets: 38 10*3/uL — ABNORMAL LOW (ref 150–400)
RBC: 2.28 MIL/uL — ABNORMAL LOW (ref 4.22–5.81)
RBC: 2.23 MIL/uL — ABNORMAL LOW (ref 4.22–5.81)
RDW: 19.7 % — ABNORMAL HIGH (ref 11.5–15.5)
RDW: 20.1 % — ABNORMAL HIGH (ref 11.5–15.5)
WBC: 6.9 10*3/uL (ref 4.0–10.5)
WBC: 8.2 10*3/uL (ref 4.0–10.5)
nRBC: 0.3 % — ABNORMAL HIGH (ref 0.0–0.2)
nRBC: 0.4 % — ABNORMAL HIGH (ref 0.0–0.2)

## 2021-12-19 LAB — URINALYSIS, ROUTINE W REFLEX MICROSCOPIC
Bilirubin Urine: NEGATIVE
Glucose, UA: NEGATIVE mg/dL
Ketones, ur: 5 mg/dL — AB
Leukocytes,Ua: NEGATIVE
Nitrite: NEGATIVE
Protein, ur: 100 mg/dL — AB
Specific Gravity, Urine: 1.013 (ref 1.005–1.030)
pH: 5 (ref 5.0–8.0)

## 2021-12-19 LAB — COMPREHENSIVE METABOLIC PANEL
ALT: 627 U/L — ABNORMAL HIGH (ref 0–44)
AST: 1436 U/L — ABNORMAL HIGH (ref 15–41)
Albumin: 2.7 g/dL — ABNORMAL LOW (ref 3.5–5.0)
Alkaline Phosphatase: 69 U/L (ref 38–126)
Anion gap: 12 (ref 5–15)
BUN: 56 mg/dL — ABNORMAL HIGH (ref 8–23)
CO2: 21 mmol/L — ABNORMAL LOW (ref 22–32)
Calcium: 7.6 mg/dL — ABNORMAL LOW (ref 8.9–10.3)
Chloride: 102 mmol/L (ref 98–111)
Creatinine, Ser: 1.7 mg/dL — ABNORMAL HIGH (ref 0.61–1.24)
GFR, Estimated: 42 mL/min — ABNORMAL LOW (ref >60)
Glucose, Bld: 208 mg/dL — ABNORMAL HIGH (ref 70–99)
Potassium: 4.5 mmol/L (ref 3.5–5.1)
Sodium: 135 mmol/L (ref 135–145)
Total Bilirubin: 5.6 mg/dL — ABNORMAL HIGH (ref 0.3–1.2)
Total Protein: 5.9 g/dL — ABNORMAL LOW (ref 6.5–8.1)

## 2021-12-19 LAB — LACTIC ACID, PLASMA: Lactic Acid, Venous: 2.6 mmol/L (ref 0.5–1.9)

## 2021-12-19 LAB — HAPTOGLOBIN: Haptoglobin: 108 mg/dL (ref 34–355)

## 2021-12-19 LAB — TROPONIN I (HIGH SENSITIVITY): Troponin I (High Sensitivity): 113 ng/L (ref <18)

## 2021-12-19 LAB — BILIRUBIN, DIRECT: Bilirubin, Direct: 1.4 mg/dL — ABNORMAL HIGH (ref 0.0–0.2)

## 2021-12-19 LAB — PROCALCITONIN: Procalcitonin: 3.83 ng/mL

## 2021-12-19 LAB — MAGNESIUM: Magnesium: 1.8 mg/dL (ref 1.7–2.4)

## 2021-12-19 LAB — SAVE SMEAR(SSMR), FOR PROVIDER SLIDE REVIEW

## 2021-12-19 LAB — VITAMIN B12: Vitamin B-12: 3195 pg/mL — ABNORMAL HIGH (ref 180–914)

## 2021-12-19 LAB — PHOSPHORUS: Phosphorus: 4.7 mg/dL — ABNORMAL HIGH (ref 2.5–4.6)

## 2021-12-19 MED ORDER — MAGNESIUM SULFATE 2 GM/50ML IV SOLN
2.0000 g | Freq: Once | INTRAVENOUS | Status: AC
  Administered 2021-12-19: 2 g via INTRAVENOUS
  Filled 2021-12-19: qty 50

## 2021-12-19 MED ORDER — LEVOTHYROXINE SODIUM 50 MCG PO TABS
50.0000 ug | ORAL_TABLET | Freq: Every day | ORAL | Status: DC
  Administered 2021-12-20 – 2022-01-04 (×14): 50 ug via ORAL
  Filled 2021-12-19 (×14): qty 1

## 2021-12-19 MED ORDER — SODIUM CHLORIDE 0.9 % IV SOLN
2.0000 g | Freq: Two times a day (BID) | INTRAVENOUS | Status: DC
  Administered 2021-12-19 (×2): 2 g via INTRAVENOUS
  Filled 2021-12-19 (×2): qty 12.5

## 2021-12-19 MED ORDER — ACETAMINOPHEN 325 MG PO TABS: 650.0000 mg | ORAL_TABLET | Freq: Four times a day (QID) | ORAL | Status: DC | PRN

## 2021-12-19 MED ORDER — PANTOPRAZOLE 2 MG/ML SUSPENSION
40.0000 mg | Freq: Every day | ORAL | Status: DC
Start: 2021-12-20 — End: 2021-12-23
  Administered 2021-12-20 – 2021-12-22 (×3): 40 mg via ORAL
  Filled 2021-12-19 (×3): qty 20

## 2021-12-19 MED ORDER — LEVOTHYROXINE SODIUM 50 MCG PO TABS: 50.0000 ug | ORAL_TABLET | Freq: Every day | ORAL | Status: DC

## 2021-12-19 MED ORDER — ORAL CARE MOUTH RINSE
15.0000 mL | Freq: Two times a day (BID) | OROMUCOSAL | Status: DC
  Administered 2021-12-19 – 2022-01-04 (×28): 15 mL via OROMUCOSAL

## 2021-12-19 MED ORDER — ACETAMINOPHEN 325 MG PO TABS
650.0000 mg | ORAL_TABLET | Freq: Four times a day (QID) | ORAL | Status: DC | PRN
  Administered 2021-12-19 – 2021-12-29 (×3): 650 mg via ORAL
  Filled 2021-12-19 (×3): qty 2

## 2021-12-19 MED ORDER — POLYETHYLENE GLYCOL 3350 17 G PO PACK
17.0000 g | PACK | Freq: Every day | ORAL | Status: DC | PRN
  Administered 2021-12-26: 17 g via ORAL
  Filled 2021-12-19: qty 1

## 2021-12-19 MED ORDER — OXYCODONE HCL 5 MG PO TABS
20.0000 mg | ORAL_TABLET | ORAL | Status: DC | PRN
  Administered 2021-12-20 (×2): 20 mg via ORAL
  Filled 2021-12-19 (×2): qty 4

## 2021-12-19 MED ORDER — DOCUSATE SODIUM 50 MG/5ML PO LIQD: 100.0000 mg | Freq: Two times a day (BID) | ORAL | Status: DC | PRN

## 2021-12-19 MED ORDER — LACTULOSE 10 GM/15ML PO SOLN
20.0000 g | Freq: Two times a day (BID) | ORAL | Status: DC
  Administered 2021-12-20 – 2021-12-22 (×5): 20 g via ORAL
  Filled 2021-12-19 (×6): qty 30

## 2021-12-19 NOTE — Progress Notes (Addendum)
NAME:  Derek, Hall MRN:  [id number], DOB:  09-11-1950, LOS: 1 ADMISSION DATE:  12/18/2021, CONSULTATION DATE:  12/19/21 REFERRING MD:  EDP, CHIEF COMPLAINT:  weakness and confusion   History of Present Illness:  [name] is a 71 y.o. 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  History supplemented by pt's son via phone, states that pt's brother also found him confused and on the ground the day before.  He is supposed to be taking daily medication for cirrhosis, but unsure what it is.  Pt lives independently with health aide, but concerns that he recently has not been able to care for himself.   He has history of variceal changes on [place], denies blood in stool or dark stools  On arrival to the ED pt was hypotensive and altered, labs significant for Hgb of 4.0, lactic acid >9, WBC 8k, Ast 495, ALT 107 and bilirubin 8.   He was given IVF, transfusion ordered and started on Levophed peripherally.  PCCM consulted for admission.  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   Significant Hospital Events: Including procedures, antibiotic start and stop dates in addition to other pertinent events   5/22 presented to [facility] altered and hypotensive, admit to [facility] 5/23 off pressors and more oriented this  morning, received 1 more unit PRBC's overnight  Interim History / Subjective:  More alert and oriented, though still intermittently confused, Hgb 7.1, off pressors, wants to eat Lactic acid clearing  Objective   Blood pressure 137/76, pulse 90, temperature (!) 100.7 F (38.2 C), temperature source Axillary, resp. rate (!) 23, height 6' (1.829 m), weight 89 kg, SpO2 97 %.        Intake/Output Summary (Last 24 hours) at 12/19/2021 2706 Last data filed at 12/19/2021 0800 Gross per 24 hour  Intake 3896.11 ml  Output 300 ml  Net 3596.11 ml    Filed Weights   12/18/21 1800 12/19/21 0500  Weight: 88.8 kg 89 kg    General:  ill and jaundiced-appearing elderly M in no acute distress HEENT: MM pink/moist, L pupil coloboma, scleral icterus, R pupils responsive to light Neuro: awake, oriented to person and place, confused to situation, upset and angry CV: s1s2 rrr, no m/r/g PULM:  decreased air movement bilateral bases, on [place], no distress, equal chest rise GI: soft, bsx4 active, non-tender Extremities: warm/dry, R aka edema  Skin: no rashes or lesions  Labs reviewed Lactic 2.6 Hgb 7.1 Platelets 40 Procal 2.0->3.8  Resolved Hospital Problem list     Assessment & Plan:    Shock, suspect combination of hypovolemic from acute on chronic hemolytic anemia and acutely worsening liver failure Acute Encephalopathy CT head without acute findings -received 3 units PRBC's with resolution of hypotension and improving mental status -continue follow serial hgb -no current signs of bleeding,  -lactic clearing -continue lactulose -likely continue cefepime for possible UTI/empiric SBP coverage, though no noted ascites on abd.  [place]    History of Non-immune hemolytic anemia Chronic thrombocytopenia and leukopenia Follows with Dr. Janese Banks, last seen in Jan Underwent bone marrow bx 01/2021 with hypercellular bone marrow with erythroid proliferation, suspected secondary to liver disease, currently  being monitored -LDH 2998, retic count 86, haptoglobin pending -consult hematology    AKI Hyperkalemia Creatinine 1.7, on admission, pending this AM Suspect secondary to volume depletion and sepsis -follow urine output and renal indices, avoid nephrotoxins -monitor BMP  AGMA Suspect secondary to lactic acidosis  Improving -follow BMP and lactic acid, check VBG  Cirrhosis Elevated Transaminases Hyperbilirubinemia New 6cm Liver lesion on [place] -LFT's rising, pending today -bilirubin 8.8, send direct -no significant ascites, will see if creatinine improves in the AM and then proceed with CT abd/pelvis with or without contrast    Elevated BNP and troponin Suspect secondary to demand, no ischemic changes on EKG -check Echo, troponin pending      Best Practice (right click and "Reselect all SmartList Selections" daily)   Diet/type: NPO feeding tube ordered, advance diet as tolerated today DVT prophylaxis: SCD GI prophylaxis: PPI Lines: Central line Foley:  N/A Code Status:  full code Last date of multidisciplinary goals of care discussion [5/22 spoke with son, full code update pending 5/23]  Labs   CBC: Recent Labs  Lab 12/18/21 1124 12/18/21 2207 12/19/21 0621  WBC 8.1 10.2 6.9  NEUTROABS 6.5  --   --   HGB 4.0* 6.7* 7.1*  HCT 12.5* 20.1* 22.1*  MCV 105.0* 98.5 96.9  PLT 78* 58* 40*     Basic Metabolic Panel: Recent Labs  Lab 12/18/21 1124 12/18/21 1751 12/19/21 0621  NA 135 136  --   K 5.5* 5.1  --   CL 96* 100  --   CO2 17* 19*  --   GLUCOSE 145* 213*  --   BUN 38* 42*  --   CREATININE 1.70* 1.44*  --   CALCIUM 8.1* 7.5*  --   MG  --   --  1.8  PHOS  --   --  4.7*    GFR: Estimated Creatinine Clearance: 50.9 mL/min (A) (by C-G formula based on SCr of 1.44 mg/dL (H)). Recent Labs  Lab 12/18/21 1124 12/18/21 1222 12/18/21 1545 12/18/21 2207 12/19/21 0621  PROCALCITON  --   --   --  2.03 3.83  WBC 8.1  --   --  10.2 6.9  LATICACIDVEN  --   >9.0* >9.0*  --  2.6*     Liver Function Tests: Recent Labs  Lab 12/18/21 1124 12/18/21 1751  AST 495* 2,284*  ALT 107* 545*  ALKPHOS 70 66  BILITOT 8.5* 8.8*  PROT 7.4 6.1*  ALBUMIN 3.5 2.9*    Recent Labs  Lab 12/18/21 1124  LIPASE 30    Recent Labs  Lab 12/18/21 1222  AMMONIA 52*     ABG    Component Value Date/Time   HCO3 23.5 12/18/2021 2207   O2SAT 80.8 12/18/2021 2207     Coagulation Profile: Recent Labs  Lab 12/18/21 1124  INR 1.8*     Cardiac Enzymes: No results for input(s): CKTOTAL, CKMB, CKMBINDEX, TROPONINI in the last 168 hours.  HbA1C: Hgb A1c MFr Bld  Date/Time Value Ref Range Status  11/13/2019 11:18 AM 3.4 (L) 4.6 - 6.5 % Final    Comment:    Glycemic Control Guidelines for People with Diabetes:Non Diabetic:  <6%Goal of Therapy: <7%Additional Action Suggested:  >8%   04/08/2018 04:35 AM 3.6 (  L) 4.8 - 5.6 % Final    Comment:    (NOTE) Pre diabetes:          5.7%-6.4% Diabetes:              >6.4% Glycemic control for   <7.0% adults with diabetes     CBG: Recent Labs  Lab 12/18/21 1544  GLUCAP 59*     Review of Systems:   Unable to obatin  Past Medical History:  He,  has a past medical history of AKA, RIGHT, HX OF (12/23/2007), ANXIETY (03/24/2007), Chronic pain, CIRRHOSIS (03/24/2007), ERECTILE DYSFUNCTION, 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   Surgical History:   Past Surgical History:  Procedure Laterality Date   ABOVE KNEE LEG AMPUTATION Right    Right femur fracture, 11/1994, resulted in right AKA 2006 after staph infection   COLONOSCOPY N/A 12/16/2020   Procedure: COLONOSCOPY;  Surgeon: Virgel Manifold, MD;  Location: [facility] ENDOSCOPY;  Service: Endoscopy;   Laterality: N/A;   ESOPHAGOGASTRODUODENOSCOPY N/A 12/16/2020   Procedure: ESOPHAGOGASTRODUODENOSCOPY (EGD);  Surgeon: Virgel Manifold, MD;  Location: [facility] ENDOSCOPY;  Service: Endoscopy;  Laterality: N/A;     Social History:   reports that he has never smoked. He has never used smokeless tobacco. He reports that he does not currently use alcohol. He reports that he does not use drugs.   Family History:  His family history includes Heart attack in his paternal grandfather and paternal grandmother; Heart disease in his mother; Lung cancer (age of onset: 34) in his mother. There is no history of Liver cancer.   Allergies Allergies  Allergen Reactions   Clindamycin Dermatitis     Home Medications  Prior to Admission medications   Medication Sig Start Date End Date Taking? Authorizing Provider  ALPRAZolam Duanne Moron) 1 MG tablet Take 1 mg by mouth 3 (three) times daily.   Yes [provider]  Cholecalciferol (VITAMIN D3) 10 MCG (400 UNIT) CAPS Take 4 capsules by mouth daily.   Yes [provider]  folic acid (FOLVITE) 1 MG tablet TAKE 1 TABLET(1 MG) BY MOUTH DAILY Patient taking differently: Take 1 mg by mouth daily. 10/12/21  Yes Burns, Wandra Feinstein, NP  hydrochlorothiazide (HYDRODIURIL) 12.5 MG tablet TAKE 1 TABLET(12.5 MG) BY MOUTH DAILY Patient taking differently: Take 12.5 mg by mouth daily. 10/27/21  Yes [name], FNP  levothyroxine (SYNTHROID) 50 MCG tablet TAKE 1 TABLET(50 MCG) BY MOUTH DAILY BEFORE BREAKFAST Patient taking differently: Take 50 mcg by mouth daily before breakfast. 12/14/21  Yes Arnett, [name], FNP  losartan (COZAAR) 100 MG tablet TAKE 1 TABLET(100 MG) BY MOUTH DAILY Patient taking differently: Take 100 mg by mouth daily. 11/14/21  Yes Arnett, [name], FNP  lubiprostone (AMITIZA) 24 MCG capsule Take 24 mcg by mouth daily as needed. 08/13/14  Yes [provider]  meloxicam (MOBIC) 15 MG tablet Take 15 mg by mouth every morning.  01/12/21  Yes [provider]  morphine (MS CONTIN) 15 MG 12 hr tablet Take 15 mg by mouth 2 (two) times daily as needed for pain. 04/06/20  Yes [provider]  Oxycodone HCl 20 MG TABS Take 20 mg by mouth 4 (four) times daily. 12/04/21  Yes [provider]  pantoprazole (PROTONIX) 40 MG tablet Take 1 tablet (40 mg total) by mouth daily. 12/18/20 12/18/21 Yes  Sharen Hones, MD  vitamin B-12 (CYANOCOBALAMIN) 1000 MCG tablet Take 1 tablet (1,000 mcg total) by mouth daily. 04/09/18  Yes Bettey Costa, MD  NARCAN 4 MG/0.1ML LIQD nasal spray kit Place 1 spray into the nose once. 12/29/18   [provider]     Critical care time: 35 minutes     CRITICAL CARE Performed by: Otilio Carpen Roselle   Total critical care time: 35 minutes  Critical care time was exclusive of separately billable procedures and treating other patients.  Critical care was necessary to treat or prevent imminent or life-threatening deterioration.  Critical care was time spent personally by me on the following activities: development of treatment plan with patient and/or surrogate as well as nursing, discussions with consultants, evaluation of patient's response to treatment, examination of patient, obtaining history from patient or surrogate, ordering and performing treatments and interventions, ordering and review of laboratory studies, ordering and review of radiographic studies, pulse oximetry and re-evaluation of patient's condition.  Otilio Carpen Korson, PA-C [facility] See Amion for pager If no response to pager , please call [phone] until 7pm After 7:00 pm call Elink  [phone]?[place]

## 2021-12-19 NOTE — Evaluation (Signed)
Clinical/Bedside Swallow Evaluation Patient Details  Name: [name] [name]. MRN: [id number] Date of Birth: [birth year]  Today's Date: 12/19/2021 Time: SLP Start Time (ACUTE ONLY): 1205 SLP Stop Time (ACUTE ONLY): 1229 SLP Time Calculation (min) (ACUTE ONLY): 24 min  Past Medical History:  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   Squamous cell carcinoma of skin 05/21/2018   R mid volar forearm   Thyroid disease    UROLITHIASIS, HX OF 12/23/2007   Past Surgical History:  Past Surgical History:  Procedure Laterality Date   ABOVE KNEE LEG AMPUTATION Right    Right femur fracture, 11/1994, resulted in right AKA 2006 after staph infection   COLONOSCOPY N/A 12/16/2020   Procedure: COLONOSCOPY;  Surgeon: Virgel Manifold, MD;  Location: [facility] ENDOSCOPY;  Service: Endoscopy;  Laterality: N/A;   ESOPHAGOGASTRODUODENOSCOPY N/A 12/16/2020   Procedure: ESOPHAGOGASTRODUODENOSCOPY (EGD);  Surgeon: Virgel Manifold, MD;  Location: [facility] ENDOSCOPY;  Service: Endoscopy;  Laterality: N/A;   HPI:  Per MD notes, pt has h/o cirrhosis, former ETOH abuse, non-immune hemolytic anemia, chronic thrombocytopenia and leukopenia followed by hematology, R AKA, Hepatitis C with negative viral load since 2014 per last GI note who was brought in by EMS after home health aide found him on the ground after apparently sliding out of bed. Pt required pressors, now is off and wants to eat per notes. Swallow eval ordered.    Assessment / Plan / Recommendation   Clinical Impression  Patient presents with minimal indication or oral dysphagia - mostly characteristic by prolonged mastication - due to mentation.  No indication of aspiration with all po observed including cracker, applesauce and thin liquids.  Pt easily passed 3 ounce [place] water screen on 2nd attempt.  Pt did require assist to self feed due to weakness and benefited from verbal cues to maintain LOA.  Due to mentation, recommend dys3/thin with full supervision and medications with puree.  SLP will follow up briefly to assure po tolerance. SLP Visit Diagnosis: Dysphagia, oral phase (R13.11)    Aspiration Risk  Mild aspiration risk    Diet Recommendation Dysphagia 3 (Mech soft);Thin liquid   Liquid Administration via: Cup;Straw Medication Administration: Whole meds with puree Supervision: Staff to assist with self feeding Compensations: Minimize environmental distractions;Slow rate;Small sips/bites Postural Changes: Seated upright at 90 degrees;Remain upright for at least 30 minutes after po intake    Other  Recommendations Oral Care Recommendations: Oral care BID    Recommendations for follow up therapy are one component of a multi-disciplinary discharge planning process, led by the attending physician.  Recommendations may be updated based on patient status, additional functional criteria and insurance authorization.  Follow up Recommendations No SLP follow up      Assistance Recommended at Discharge Frequent or constant Supervision/Assistance  Functional Status Assessment Patient has had a recent decline in their functional status and demonstrates the ability to make significant improvements in function in a reasonable and predictable amount of time.  Frequency  and Duration min 1 x/week  1 week       Prognosis Prognosis for Safe Diet Advancement: Good Barriers to Reach Goals: Time post onset      Swallow Study   General Date of Onset: 12/19/21 HPI: Per MD notes, pt has h/o  cirrhosis, former ETOH abuse, non-immune hemolytic anemia, chronic thrombocytopenia and leukopenia followed by hematology, R AKA, Hepatitis C with negative viral load since 2014 per last GI note who was brought in by EMS after home health aide found him on the ground after apparently sliding out of bed. Pt required pressors, now is off and wants to eat per notes. Swallow eval ordered. Type of Study: Bedside Swallow Evaluation Diet Prior to this Study: NPO Temperature Spikes Noted: No Respiratory Status: Room air History of Recent Intubation: No Behavior/Cognition: Alert;Cooperative;Pleasant mood Oral Cavity Assessment: Within Functional Limits Oral Care Completed by SLP: No Oral Cavity - Dentition: Other (Comment) (partial) Vision: Functional for self-feeding Self-Feeding Abilities: Needs assist Patient Positioning: Upright in bed Baseline Vocal Quality: Low vocal intensity Volitional Cough: Weak Volitional Swallow: Able to elicit    Oral/Motor/Sensory Function Overall Oral Motor/Sensory Function: Within functional limits   Ice Chips Ice chips: Not tested   Thin Liquid Thin Liquid: Within functional limits Presentation: Straw;Cup    Nectar Thick Nectar Thick Liquid: Not tested   Honey Thick Honey Thick Liquid: Not tested   Puree Puree: Within functional limits Presentation: Self Fed;Spoon   Solid     Solid: Impaired Oral Phase Impairments: Reduced lingual movement/coordination Oral Phase Functional Implications: Prolonged oral transit      Macario Golds 12/19/2021,12:56 PM  Kathleen Lime, MS [facility] SLP Acute Rehab Services Office [phone] Pager [phone]

## 2021-12-19 NOTE — Progress Notes (Signed)
Met with patient's son [name], expresses concern that patient will need SNF or assisted living after discharge.  He has been living alone with home health aide previously, but functional status has been declining.   TOC, PT/OT consults placed    Otilio Carpen Hang, PA-C

## 2021-12-19 NOTE — Consult Note (Addendum)
[place]  [phone] Fax:(336) [phone]   [place]  Referring MD:  Dr. Freda Jackson  Reason for Referral: Anemia, thrombocytopenia  HPI: Mr. Fink is a 71 year old male with a past medical history significant for cirrhosis, former alcohol abuse, nonimmune hemolytic anemia, chronic thrombocytopenia and leukopenia, right AKA, history of hepatitis C with negative viral loads in 2014.  He was brought to the emergency department by EMS after being found on the ground after sliding out of bed.  Family reported that the patient was confused the day prior to admission.  Upon arrival, he was hypotensive and altered.  CBC initially showed WBC of 8.1, hemoglobin of 4.0, MCV of 105, platelets 78,000.  CMET showed a BUN of 38, creatinine 1.7, AST 495, ALT 107, T. bili 8.5.  Direct bili was 1.8.  Stool for occult blood negative.  LDH was elevated at 2998, reticulocyte count percent elevated at 4.2% with immature reticulocyte fraction of 22.2.  He has received total of 3 units PRBCs so far this admission.  Repeat CBC performed this morning showed a hemoglobin of 7.1 and platelet count of 40,000.  His AST and ALT are more elevated this morning at 1436 and 627 respectively.  T. bili down to 5.6 this morning.  The patient had an abdominal ultrasound which showed a suspected liver mass measuring greater than 6 cm.  CT recommended for further evaluation.  The patient's most recent CBC available to me prior to this admission was performed on 11/21/2021 which showed a WBC of 2.6, hemoglobin 8.5, platelets 43,000.  The patient is followed by Dr. Janese Banks at [facility] and was last seen on 08/23/2021.  Progress note from that date has been reviewed.  Cytopenias were thought to be due to to nonimmune extravascular hemolysis in the setting of cirrhosis.  The patient's hemoglobin fluctuates between 6 and 10 and he has required intermittent blood transfusions in the past.  This  morning, the patient is more alert.  He knows that it is 2023 and that he is in [place].  He tells me that he is going to fly to [place].  Upon further questioning, he states that his son lives in [place] and it sounds like he might be going there to live at some point in the near future. The patient reports that he has his chronic baseline pain.  He is not complaining of any headaches or dizziness morning.  Denies chest pain, shortness of breath, cough.  Denies abdominal pain, nausea, vomiting.  Denies bleeding.  The patient recalls being followed by hematologist at [facility].  He tells me that his platelets typically are about 35,000.  He also knows that he has received several transfusions in their office in the past.  The patient is widowed.  He has 1 son.  Has a history of alcohol abuse.  Hematology was asked see the patient to make recommendations regarding his anemia and thrombocytopenia.   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   Squamous cell carcinoma of skin 05/21/2018   R mid volar forearm   Thyroid disease    UROLITHIASIS, HX OF 12/23/2007  :     Past Surgical History:  Procedure Laterality Date   ABOVE KNEE LEG AMPUTATION Right    Right femur fracture, 11/1994, resulted in right AKA 2006 after staph infection   COLONOSCOPY N/A 12/16/2020   Procedure: COLONOSCOPY;  Surgeon: Virgel Manifold, MD;  Location: [facility] ENDOSCOPY;  Service: Endoscopy;  Laterality: N/A;   ESOPHAGOGASTRODUODENOSCOPY N/A 12/16/2020   Procedure:  ESOPHAGOGASTRODUODENOSCOPY (EGD);  Surgeon: Virgel Manifold, MD;  Location: [facility] ENDOSCOPY;  Service: Endoscopy;  Laterality: N/A;  :   CURRENT MEDS: Current Facility-Administered Medications  Medication Dose Route Frequency Provider Last Rate Last Admin   0.9 %  sodium chloride infusion   Intra-arterial PRN Brandstetter, Otilio Carpen, PA-C       ceFEPIme (MAXIPIME) 2 g in sodium chloride 0.9 % 100 mL IVPB  2 g Intravenous Q12H Emiliano Dyer, RPH 200 mL/hr at 12/19/21 1048 2 g at 12/19/21 1048   Chlorhexidine Gluconate Cloth 2 % PADS 6 each  6 each Topical Daily Freddi Starr, MD   6 each at 12/18/21 1550   docusate (COLACE) 50 MG/5ML liquid 100 mg  100 mg Per Tube BID PRN Shimp, Otilio Carpen, PA-C       lactulose (CHRONULAC) 10 GM/15ML solution 20 g  20 g Per Tube BID Snuffer, Otilio Carpen, PA-C   20 g at 12/19/21 0917   [START ON 12/20/2021] levothyroxine (SYNTHROID) tablet 50 mcg  50 mcg Per Tube [id number] Freddi Starr, MD       MEDLINE mouth rinse  15 mL Mouth Rinse BID Freddi Starr, MD   15 mL at 12/19/21 0910   norepinephrine (LEVOPHED) '4mg'$  in 223m (0.016 mg/mL) premix infusion  0-40 mcg/min Intravenous Titrated EMargaretha Seeds MD   Stopped at 12/18/21 2240   oxyCODONE (Oxy IR/ROXICODONE) immediate release tablet 15 mg  15 mg Oral Q6H PRN EMargaretha Seeds MD   15 mg at 12/19/21 0530   pantoprazole sodium (PROTONIX) 40 mg/20 mL oral suspension 40 mg  40 mg Per Tube Daily Horgan, LOtilio Carpen PA-C   40 mg at 12/19/21 05498  polyethylene glycol (MIRALAX / GLYCOLAX) packet 17 g  17 g Per Tube Daily PRN Vallely, LOtilio Carpen PA-C       vancomycin (VANCOREADY) IVPB 1250 mg/250 mL  1,250 mg Intravenous Q24H Gadhia, Jigna M, [facility]          Allergies  Allergen Reactions   Clindamycin Dermatitis  :   Family History  Problem Relation Age of Onset   Heart disease Mother    Lung cancer Mother 840  Heart attack Paternal Grandmother    Heart attack Paternal Grandfather    Liver cancer  Neg Hx   :   Social History   Socioeconomic History   Marital status: Widowed    Spouse name: Not on file   Number of children: Not on file   Years of education: Not on file   Highest education level: Not on file  Occupational History   Occupation: Disabled  Tobacco Use   Smoking status: Never   Smokeless tobacco: Never  Substance and Sexual Activity   Alcohol use: Not Currently    Alcohol/week: 0.0 standard drinks   Drug use: No   Sexual activity: Not on file  Other Topics Concern   Not  on file  Social History Narrative   Widowed 1997   Lives alone   Lost a son in his arms in 6.    3 sons   Social Determinants of Health   Financial Resource Strain: Not on file  Food Insecurity: Not on file  Transportation Needs: Not on file  Physical Activity: Not on file  Stress: Not on file  Social Connections: Not on file  Intimate Partner Violence: Not on file  :  REVIEW OF SYSTEMS:  A comprehensive 14 point review of systems was negative except as noted in the HPI.    Exam: Patient Vitals for the past 24 hrs:  BP Temp Temp src Pulse Resp SpO2 Height Weight  12/19/21 1006 -- -- -- 94 (!) 23 97 % -- --  12/19/21 1000 (!) 117/52 -- -- 93 (!) 34 98 % -- --  12/19/21 0900 103/60 -- -- 95 17 92 % -- --  12/19/21 0800 137/76 99.1 F (37.3 C) Axillary 90 -- 97 % -- --  12/19/21 0710 -- -- -- 92 -- 95 % -- --  12/19/21 0545 -- -- -- 92 -- 95 % -- --  12/19/21 0530 125/68 -- -- 93 -- 96 % -- --  12/19/21 0515 (!) 120/55 -- -- 92 -- 95 % -- --  12/19/21 0500 -- -- -- -- -- -- -- 89 kg  12/19/21 0400 (!) 143/58 -- -- 92 -- 98 % -- --  12/19/21 0345 (!) 136/51 (!) 100.7 F (38.2 C) Axillary 87 -- 95 % -- --  12/19/21 0330 (!) 136/49 -- -- 88 -- 94 % -- --  12/19/21 0300 (!) 148/59 (!) 101.1 F (38.4 C) Axillary 86 (!) 23 95 % -- --  12/19/21 0245 (!) 141/63 -- -- 85 -- 95 % -- --  12/19/21 0200 139/79 -- -- 85 -- 95 % -- --  12/19/21 0145 (!) 138/50 -- -- 87 -- 98 % -- --   12/19/21 0130 126/90 -- -- 86 -- 97 % -- --  12/19/21 0115 103/70 -- -- 87 -- 97 % -- --  12/19/21 0100 (!) 149/56 -- -- 85 -- 95 % -- --  12/19/21 0048 -- (!) 101 F (38.3 C) Axillary -- (!) 22 -- -- --  12/19/21 0045 (!) 147/48 -- -- 86 -- 96 % -- --  12/19/21 0033 (!) 137/38 (!) 101 F (38.3 C) Oral 85 (!) 21 99 % -- --  12/19/21 0030 (!) 137/38 -- -- 86 -- 99 % -- --  12/19/21 0015 -- -- -- 83 -- 98 % -- --  12/19/21 0000 (!) 153/51 -- -- 84 -- 98 % -- --  12/18/21 2345 (!) 144/51 -- -- 81 -- 97 % -- --  12/18/21 2336 -- (!) 100.9 F (38.3 C) Axillary -- -- -- -- --  12/18/21 2330 (!) 106/51 -- -- 83 -- 99 % -- --  12/18/21 2020 (!) 162/36 (!) 97.5 F (36.4 C) Oral -- -- -- -- --  12/18/21 2015 (!) 152/38 -- -- 74 (!) 35 99 % -- --  12/18/21 2000 (!) 161/39 -- -- 76 (!) 30 100 % -- --  12/18/21 1945 135/60 98.6 F (37 C) Axillary 73 (!) 33 98 % -- --  12/18/21 1930 (!) 139/47 -- -- 72 (!) 27 98 % -- --  12/18/21 1915 (!) 134/47 -- -- 72 (!) 30 99 % -- --  12/18/21 1900 (!) 91/52 -- -- -- (!) 22 -- -- --  12/18/21 1839 (!) 144/90 -- -- -- (!) 36 -- -- --  12/18/21 1837 (!) 144/23 -- -- 72 (!) 35 100 % -- --  12/18/21 1833 (!) 145/35 -- -- -- (!) 33 -- -- --  12/18/21 1832 (!) 145/35 97.8 F (36.6 C) Axillary 73 (!) 34 100 % -- --  12/18/21 1830 (!) 156/31 -- -- 71 (!) 32 100 % -- --  12/18/21 1816 (!) 123/39 97.9 F (36.6 C) Axillary -- (!) 35 100 % -- --  12/18/21 1809 (!) 140/46 97.9 F (36.6 C) Axillary 72 -- 100 % -- --  12/18/21 1800 -- -- -- -- -- -- 6' (1.829 m) 88.8 kg  12/18/21 1745 (!) 137/41 -- -- -- 16 -- -- --  12/18/21 1730 (!) 116/38 -- -- 73 19 100 % -- --  12/18/21 1715 (!) 145/38 -- -- -- (!) 31 -- -- --  12/18/21 1700 -- -- -- -- (!) 30 -- -- --  12/18/21 1645 (!) 130/92 -- -- 70 (!) 34 96 % -- --  12/18/21 1630 (!) 146/35 -- -- 73 (!) 26 100 % -- --  12/18/21 1615 (!) 150/36 (!) 97.5 F (36.4 C) -- 74 (!) 33 97 % -- --  12/18/21 1600 (!) 128/36 (!)  97.5 F (36.4 C) Oral 74 (!) 21 94 % -- --  12/18/21 1545 (!) 118/12 97.6 F (36.4 C) Axillary 68 (!) 24 94 % -- --  12/18/21 1530 -- -- -- -- (!) 26 -- -- --  12/18/21 1502 (!) 104/41 -- -- 71 (!) 45 100 % -- --  12/18/21 1443 -- -- -- 72 (!) 34 100 % -- --  12/18/21 1442 (!) 98/39 -- -- -- (!) 21 -- -- --  12/18/21 1439 (!) 85/38 -- -- 72 (!) 23 100 % -- --  12/18/21 1430 (!) 79/40 -- -- -- (!) 40 -- -- --  12/18/21 1429 (!) 81/49 -- -- -- 14 -- -- --  12/18/21 1428 (!) 81/49 (!) 97.2 F (36.2 C) Rectal 67 18 100 % -- --  12/18/21 1400 92/62 -- -- 75 (!) 40 100 % -- --  12/18/21 1345 -- -- -- (!) 47 (!) 25 95 % -- --  12/18/21 1340 (!) 79/52 -- -- 74 (!) 23 100 % -- --  12/18/21 1330 (!) 82/54 -- -- 74 (!) 32 100 % -- --  12/18/21 1320 (!) 87/35 -- -- (!) 40 (!) 41 92 % -- --  12/18/21 1315 -- -- -- 73 (!) 40 100 % -- --  12/18/21 1310 (!) 85/42 -- -- 69 (!) 40 100 % -- --  12/18/21 1300 (!) 86/46 -- -- (!) 44 (!) 39 98 % -- --  12/18/21 1250 (!) 93/35 -- -- (!) 44 (!) 40 95 % -- --  12/18/21 1248 (!) 91/45 -- -- -- (!) 46 -- -- --  12/18/21 1245 -- -- -- (!) 52 (!) 37 95 % -- --  12/18/21 1230 (!) 75/45 -- -- 71 19 100 % -- --  12/18/21 1215 96/75 -- -- 71 (!) 45 100 % -- --  12/18/21 1210 (!) 77/55 -- -- 74 (!) 26 99 % -- --  12/18/21 1205 105/63 -- -- -- (!) 54 -- -- --  12/18/21 1200 (!) 80/49 -- -- -- (!) 47 -- -- --  12/18/21 1149 92/65 -- -- 71 18 100 % -- --  12/18/21 1145 -- -- -- 63 (!) 35 100 % -- --  12/18/21 1130 92/65 -- -- -- (!) 40 -- -- --  12/18/21 1115 100/61 -- -- 97 (!) 9 (!) 88 % -- --  12/18/21 1107 109/65 98.3 F (36.8 C) Rectal 73 20 100 % -- --  12/18/21 1100 -- -- -- -- 10 -- -- --    Physical Exam Vitals reviewed.  Constitutional:      Appearance: He is ill-appearing.  HENT:     Head: Normocephalic.     Mouth/Throat:     Pharynx: No oropharyngeal exudate or posterior oropharyngeal erythema.  Eyes:     Comments: Mild scleral icterus   Cardiovascular:     Rate and Rhythm: Normal rate.  Pulmonary:     Effort: Pulmonary effort is normal. No respiratory distress.  Abdominal:     Palpations: Abdomen is soft.     Tenderness: There is no abdominal tenderness.  Musculoskeletal:     Comments: Right AKA  Skin:    General: Skin is warm and dry.     Coloration: Skin is jaundiced.  Neurological:     Mental Status: He is alert.     Comments: The patient is alert.  He is oriented to person, place, time.  However, he falls asleep quickly while talking to me.    LABS:  Lab Results  Component Value Date   WBC 6.9 12/19/2021   HGB 7.1 (L) 12/19/2021   HCT 22.1 (L) 12/19/2021   PLT 40 (L) 12/19/2021   GLUCOSE 213 (H) 12/18/2021   CHOL 121 11/13/2019   TRIG 127.0 11/13/2019   HDL 41.30 11/13/2019   LDLDIRECT 29.4 02/26/2012   LDLCALC 54 11/13/2019   ALT 545 (H) 12/18/2021   AST 2,284 (H) 12/18/2021   NA 136 12/18/2021   K 5.1 12/18/2021   CL 100 12/18/2021   CREATININE 1.44 (H) 12/18/2021   BUN 42 (H) 12/18/2021   CO2 19 (L) 12/18/2021   PSA 1.29 11/13/2019   INR 1.8 (H) 12/18/2021   HGBA1C 3.4 (L) 11/13/2019    DG Abd 1 View  Result Date: 12/18/2021 CLINICAL DATA:  Enteric catheter placement EXAM: ABDOMEN - 1 VIEW COMPARISON:  None Available. FINDINGS: Frontal view of the lower chest and upper abdomen demonstrates a weighted tip of an enteric feeding catheter projecting over the gastric antrum. Bowel gas pattern is unremarkable. IMPRESSION: 1. Enteric catheter tip projecting over the gastric antrum. Electronically Signed   By: Randa Ngo M.D.   On: 12/18/2021 19:25   CT HEAD WO CONTRAST (5MM)  Result Date: 12/18/2021 CLINICAL DATA:  Altered mental status EXAM: CT HEAD WITHOUT CONTRAST TECHNIQUE: Contiguous axial images were obtained from the base of the skull through the vertex without intravenous contrast. RADIATION DOSE REDUCTION: This exam was performed according to the departmental dose-optimization program  which includes automated exposure control, adjustment of the mA and/or kV according to patient size and/or use of iterative reconstruction technique. COMPARISON:  12/21/2019 FINDINGS: Brain: No evidence of acute infarction, hemorrhage, cerebral edema, mass, mass effect, or midline shift. No hydrocephalus or extra-axial fluid collection. Vascular: No hyperdense vessel. Skull: Normal. Negative for fracture or focal lesion. Sinuses/Orbits: No acute finding. Other: The mastoid air cells are well aerated. IMPRESSION: No acute intracranial process. Electronically Signed   By: Merilyn Baba M.D.   On: 12/18/2021 21:43   DG CHEST PORT 1 VIEW  Result Date: 12/18/2021 CLINICAL DATA:  Central line placement EXAM: PORTABLE CHEST 1 VIEW COMPARISON:  12/18/2021 FINDINGS: Right-sided central venous catheter tip over the SVC. No  pneumothorax. No focal opacity, or pleural effusion. Stable borderline cardiomegaly. IMPRESSION: 1. Right IJ central venous catheter tip over the SVC. No pneumothorax 2. Clear lung fields Electronically Signed   By: Donavan Foil M.D.   On: 12/18/2021 16:56   DG Chest Port 1 View  Result Date: 12/18/2021 CLINICAL DATA:  Questionable sepsis. EXAM: PORTABLE CHEST 1 VIEW COMPARISON:  Dec 21, 2019 FINDINGS: Likely projectional prominence of the cardiac silhouette and vascular pedicle. Aortic atherosclerosis. No focal airspace consolidation. No visible pleural effusion or pneumothorax. No acute abnormality. IMPRESSION: No focal airspace consolidation or visible pleural effusion. Electronically Signed   By: Dahlia Bailiff M.D.   On: 12/18/2021 12:06   ECHOCARDIOGRAM COMPLETE  Result Date: 12/19/2021    ECHOCARDIOGRAM REPORT   Patient Name:   Daison Braxton Haigler [name]. Date of Exam: 12/19/2021 Medical Rec #:  [id number]            Height:       72.0 in Accession #:    [id number]           Weight:       196.2 lb Date of Birth:  Nov 22, 1950            BSA:          2.113 m Patient Age:    13 years             BP:            137/76 mmHg Patient Gender: M                    HR:           89 bpm. Exam Location:  Inpatient Procedure: 2D Echo, Cardiac Doppler and Color Doppler Indications:    Elevated troponin  History:        Patient has prior history of Echocardiogram examinations, most                 recent 04/09/2018. Risk Factors:Hypertension.  Sonographer:    Jefferey Pica Referring Phys: [id number] [place]  1. Left ventricular ejection fraction, by estimation, is 55 to 60%. The left ventricle has normal function. The left ventricle demonstrates regional wall motion abnormalities (basal septal hypokinesis). There is moderate concentric left ventricular hypertrophy. Left ventricular diastolic parameters are consistent with Grade I diastolic dysfunction (impaired relaxation). Qualitatively LV appears slightly underfilled.  2. Right ventricular systolic function is normal. The right ventricular size is normal. Tricuspid regurgitation signal is inadequate for assessing PA pressure.  3. The mitral valve is normal in structure. No evidence of mitral valve regurgitation. No evidence of mitral stenosis.  4. The aortic valve was not well visualized. There is mild calcification of the aortic valve. Aortic valve regurgitation is not visualized.  5. The inferior vena cava is normal in size with greater than 50% respiratory variability, suggesting right atrial pressure of 3 mmHg. Comparison(s): Prior images reviewed side by side. Wall motion abnormalities are new from prior: prior study was not performed off-axis. LV size was normal in prior study. FINDINGS  Left Ventricle: Left ventricular ejection fraction, by estimation, is 55 to 60%. The left ventricle has normal function. The left ventricle demonstrates regional wall motion abnormalities. The left ventricular internal cavity size was normal in size. There is moderate concentric left ventricular hypertrophy. Left ventricular diastolic parameters are consistent  with Grade I diastolic dysfunction (impaired relaxation).  LV Wall Scoring: The basal anteroseptal segment, mid inferoseptal segment, and basal  inferoseptal segment are hypokinetic. Right Ventricle: The right ventricular size is normal. No increase in right ventricular wall thickness. Right ventricular systolic function is normal. Tricuspid regurgitation signal is inadequate for assessing PA pressure. Left Atrium: Left atrial size was normal in size. Right Atrium: Right atrial size was normal in size. Pericardium: Trivial pericardial effusion is present. Presence of epicardial fat layer. Mitral Valve: The mitral valve is normal in structure. No evidence of mitral valve regurgitation. No evidence of mitral valve stenosis. Tricuspid Valve: The tricuspid valve is normal in structure. Tricuspid valve regurgitation is not demonstrated. No evidence of tricuspid stenosis. Aortic Valve: The aortic valve was not well visualized. There is mild calcification of the aortic valve. Aortic valve regurgitation is not visualized. Aortic valve peak gradient measures 8.5 mmHg. Pulmonic Valve: The pulmonic valve was not well visualized. Pulmonic valve regurgitation is not visualized. Aorta: The aortic root and ascending aorta are structurally normal, with no evidence of dilitation. Venous: The inferior vena cava is normal in size with greater than 50% respiratory variability, suggesting right atrial pressure of 3 mmHg. IAS/Shunts: No atrial level shunt detected by color flow Doppler.  LEFT VENTRICLE PLAX 2D LVIDd:         5.10 cm LVIDs:         3.30 cm LV PW:         1.50 cm LV IVS:        1.50 cm LVOT diam:     2.10 cm LV SV:         51 LV SV Index:   24 LVOT Area:     3.46 cm  IVC IVC diam: 1.80 cm LEFT ATRIUM           Index        RIGHT ATRIUM           Index LA diam:      3.30 cm 1.56 cm/m   RA Area:     16.60 cm LA Vol (A4C): 57.0 ml 26.97 ml/m  RA Volume:   48.90 ml  23.14 ml/m  AORTIC VALVE                 PULMONIC VALVE  AV Area (Vmax): 2.28 cm     PV Vmax:       1.06 m/s AV Vmax:        146.00 cm/s  PV Peak grad:  4.5 mmHg AV Peak Grad:   8.5 mmHg LVOT Vmax:      96.00 cm/s LVOT Vmean:     55.600 cm/s LVOT VTI:       0.146 m  AORTA Ao Root diam: 3.70 cm Ao Asc diam:  3.40 cm MITRAL VALVE MV Area (PHT): 3.63 cm    SHUNTS MV Decel Time: 209 msec    Systemic VTI:  0.15 m MV E velocity: 44.60 cm/s  Systemic Diam: 2.10 cm MV A velocity: 56.60 cm/s MV E/A ratio:  0.79 Rudean Haskell MD Electronically signed by Rudean Haskell MD Signature Date/Time: 12/19/2021/8:46:06 AM    Final    US Abdomen Limited RUQ (LIVER/GB)  Result Date: 12/18/2021 CLINICAL DATA:  A 71 year old male presents for evaluation of elevated liver function tests. EXAM: ULTRASOUND ABDOMEN LIMITED RIGHT UPPER QUADRANT COMPARISON:  Imaging from January 25, 2021. FINDINGS: Gallbladder: Gallbladder not well assessed due to limitations with patient positioning and sonographic window. Question of gallbladder wall thickening. The gallbladder is not imaged in its entirety Common bile duct: Diameter: 3.3 mm though not well  assessed due to patient positioning. Liver: Heterogeneous hepatic echotexture with lobular hepatic contours. Question of liver mass seen potentially along the LEFT hepatic lobe though not well assessed. Not definitively associated with the liver on some images. Again with limited assessment as this is deeper in the abdomen, not along the superficial hepatic margin. This may measure up to 6.5 x 6.4 x 4.8 cm. Portal vein is patent on color Doppler imaging with normal direction of blood flow towards the liver. Other: Again markedly limited assessment due to difficulty in positioning the patient for the evaluation. IMPRESSION: Very limited evaluation. Gallbladder is not fully assessed with signs of mild thickening along visualized portions. Liver with suspected mass measuring greater than 6 cm. Suggest further evaluation with contrasted CT, if  intravenous contrast cannot be administered noncontrast CT would likely be of benefit to exclude acute process and guide further workup/management. Electronically Signed   By: Zetta Bills M.D.   On: 12/18/2021 14:00     ASSESSMENT AND PLAN:  This is a 71 year old male with  1.  Nonimmune hemolytic anemia.  The patient has a known history of chronic normocytic anemia due to nonimmune extravascular hemolysis in the setting of cirrhosis.  Baseline hemoglobin per his hematologist note is 6-10.  Recommend close monitoring of his hemoglobin and transfuse for hemoglobin less than 7.  2.  Thrombocytopenia.  Has long any history of thrombocytopenia.  Review of prior lab work show that baseline platelets are in the 35,000-50,000 range for at least the past 3 to 4 years.  Recommend close monitoring.  Would transfuse platelets for active bleeding.  3.  Suspected liver mass.  Liver mass noted on abdominal ultrasound.  Primary team planning to check a CT of the abdomen/pelvis when renal function improves for further evaluation.  4.  Cirrhosis with acutely worsening liver failure.  The patient has a history of alcohol abuse as well as treated hepatitis C.  Worsening liver function likely related to shock.  Monitor.  Thank you for this referral.  Mikey Bussing, DNP, AGPCNP-BC, AOCNP   Oncology addendum.   Patient seen and examined.  71 year old with history of cirrhosis of the liver and not on immune hemolytic anemia that has been followed with intermittent transfusion needs.  His work-up for autoimmune hemolysis has been negative.  He was hospitalized for severe anemia and shock and required transfusion and ICU stay.  Laboratory data reviewed at this time and showed reasonable response to transfusion from hemoglobin of 4.0 up to 7.2 in the last 24 hours with stability of his hemoglobin overnight.  At this time, I do not recommend any additional treatment.  There is no role for steroids given the  nonimmune nature of his hemolysis.  I recommend close follow-up and surveillance upon discharge for possible transfusions.  He follows with Dr. Janese Banks and will alert her of this hospitalization and upon his discharge.  Zola Button MD 12/19/2021

## 2021-12-19 NOTE — Progress Notes (Signed)
[facility] Progress Note Patient Name: [name]. DOB: 13-Sep-1950 MRN: [id number]   Date of Service  12/19/2021  HPI/Events of Note  Patient in chronic pain. On MS contin and oxycodone at home  LFTs improving  eICU Interventions  Increased oxycodone to home 20 mg q4h PRN Added PRN Tylenol for fever and mild pain     Intervention Category Minor Interventions: Routine modifications to care plan (e.g. PRN medications for pain, fever)  Carlis Blanchard Rodman Pickle 12/19/2021, 9:00 PM

## 2021-12-19 NOTE — Progress Notes (Signed)
Transition of Care [facility]) Screening Note  Patient Details  Name: Nixxon Faria Castronova [name]. Date of Birth: 1951-04-06  Transition of Care [facility]) CM/SW Contact:    Sherie Don, LCSW Phone Number: 12/19/2021, 10:12 AM  Transition of Care Department [facility]) has reviewed patient and no TOC needs have been identified at this time. We will continue to monitor patient advancement through interdisciplinary progression rounds. If new patient transition needs arise, please place a TOC consult.

## 2021-12-19 NOTE — Progress Notes (Signed)
Patient's son, [name], states he will return Wednesday 5/24 and would like to speak to [facility] in regards to placement for his father. [name] states he will be on campus around 10am, but if he is not present when someone from the [facility] team rounds, that he wishes to receive a phone call.

## 2021-12-20 ENCOUNTER — Inpatient Hospital Stay (HOSPITAL_COMMUNITY): Payer: Medicare Other

## 2021-12-20 DIAGNOSIS — R5381 Other malaise: Secondary | ICD-10-CM

## 2021-12-20 DIAGNOSIS — K7031 Alcoholic cirrhosis of liver with ascites: Secondary | ICD-10-CM | POA: Diagnosis not present

## 2021-12-20 DIAGNOSIS — T796XXA Traumatic ischemia of muscle, initial encounter: Secondary | ICD-10-CM | POA: Insufficient documentation

## 2021-12-20 DIAGNOSIS — G9341 Metabolic encephalopathy: Secondary | ICD-10-CM

## 2021-12-20 DIAGNOSIS — E875 Hyperkalemia: Secondary | ICD-10-CM

## 2021-12-20 DIAGNOSIS — R932 Abnormal findings on diagnostic imaging of liver and biliary tract: Secondary | ICD-10-CM

## 2021-12-20 DIAGNOSIS — R571 Hypovolemic shock: Secondary | ICD-10-CM

## 2021-12-20 DIAGNOSIS — N179 Acute kidney failure, unspecified: Secondary | ICD-10-CM

## 2021-12-20 DIAGNOSIS — R160 Hepatomegaly, not elsewhere classified: Secondary | ICD-10-CM

## 2021-12-20 DIAGNOSIS — G894 Chronic pain syndrome: Secondary | ICD-10-CM

## 2021-12-20 DIAGNOSIS — M6282 Rhabdomyolysis: Secondary | ICD-10-CM

## 2021-12-20 DIAGNOSIS — E871 Hypo-osmolality and hyponatremia: Secondary | ICD-10-CM

## 2021-12-20 DIAGNOSIS — D696 Thrombocytopenia, unspecified: Secondary | ICD-10-CM

## 2021-12-20 DIAGNOSIS — E878 Other disorders of electrolyte and fluid balance, not elsewhere classified: Secondary | ICD-10-CM

## 2021-12-20 DIAGNOSIS — R748 Abnormal levels of other serum enzymes: Secondary | ICD-10-CM

## 2021-12-20 LAB — COMPREHENSIVE METABOLIC PANEL
ALT: 487 U/L — ABNORMAL HIGH (ref 0–44)
AST: 678 U/L — ABNORMAL HIGH (ref 15–41)
Albumin: 2.5 g/dL — ABNORMAL LOW (ref 3.5–5.0)
Alkaline Phosphatase: 71 U/L (ref 38–126)
Anion gap: 10 (ref 5–15)
BUN: 63 mg/dL — ABNORMAL HIGH (ref 8–23)
CO2: 21 mmol/L — ABNORMAL LOW (ref 22–32)
Calcium: 7.8 mg/dL — ABNORMAL LOW (ref 8.9–10.3)
Chloride: 103 mmol/L (ref 98–111)
Creatinine, Ser: 1.89 mg/dL — ABNORMAL HIGH (ref 0.61–1.24)
GFR, Estimated: 37 mL/min — ABNORMAL LOW (ref >60)
Glucose, Bld: 203 mg/dL — ABNORMAL HIGH (ref 70–99)
Potassium: 3.9 mmol/L (ref 3.5–5.1)
Sodium: 134 mmol/L — ABNORMAL LOW (ref 135–145)
Total Bilirubin: 3.9 mg/dL — ABNORMAL HIGH (ref 0.3–1.2)
Total Protein: 5.7 g/dL — ABNORMAL LOW (ref 6.5–8.1)

## 2021-12-20 LAB — CBC
HCT: 20.3 % — ABNORMAL LOW (ref 39.0–52.0)
Hemoglobin: 6.7 g/dL — CL (ref 13.0–17.0)
MCH: 31.3 pg (ref 26.0–34.0)
MCHC: 33 g/dL (ref 30.0–36.0)
MCV: 94.9 fL (ref 80.0–100.0)
Platelets: 31 10*3/uL — ABNORMAL LOW (ref 150–400)
RBC: 2.14 MIL/uL — ABNORMAL LOW (ref 4.22–5.81)
RDW: 20.4 % — ABNORMAL HIGH (ref 11.5–15.5)
WBC: 8.6 10*3/uL (ref 4.0–10.5)
nRBC: 0.4 % — ABNORMAL HIGH (ref 0.0–0.2)

## 2021-12-20 LAB — HEMOGLOBIN AND HEMATOCRIT, BLOOD
HCT: 29 % — ABNORMAL LOW (ref 39.0–52.0)
Hemoglobin: 9.6 g/dL — ABNORMAL LOW (ref 13.0–17.0)

## 2021-12-20 LAB — URINE CULTURE: Culture: NO GROWTH

## 2021-12-20 LAB — PROCALCITONIN: Procalcitonin: 3.19 ng/mL

## 2021-12-20 LAB — HEMOGLOBIN A1C
Hgb A1c MFr Bld: 4.9 % (ref 4.8–5.6)
Mean Plasma Glucose: 93.93 mg/dL

## 2021-12-20 LAB — PREPARE RBC (CROSSMATCH)

## 2021-12-20 LAB — MAGNESIUM: Magnesium: 2.3 mg/dL (ref 1.7–2.4)

## 2021-12-20 LAB — MRSA NEXT GEN BY PCR, NASAL: MRSA by PCR Next Gen: DETECTED — AB

## 2021-12-20 LAB — CK: Total CK: 4231 U/L — ABNORMAL HIGH (ref 49–397)

## 2021-12-20 MED ORDER — BOOST / RESOURCE BREEZE PO LIQD CUSTOM
1.0000 | Freq: Two times a day (BID) | ORAL | Status: DC
  Administered 2021-12-20 – 2021-12-26 (×9): 1 via ORAL

## 2021-12-20 MED ORDER — PROSOURCE PLUS PO LIQD
30.0000 mL | Freq: Every day | ORAL | Status: DC
  Administered 2021-12-20 – 2022-01-04 (×15): 30 mL via ORAL
  Filled 2021-12-20 (×15): qty 30

## 2021-12-20 MED ORDER — SODIUM CHLORIDE 0.9% IV SOLUTION: Freq: Once | INTRAVENOUS | Status: AC

## 2021-12-20 MED ORDER — ADULT MULTIVITAMIN W/MINERALS CH
1.0000 | ORAL_TABLET | Freq: Every day | ORAL | Status: DC
  Administered 2021-12-21 – 2022-01-04 (×14): 1 via ORAL
  Filled 2021-12-20 (×15): qty 1

## 2021-12-20 MED ORDER — CHLORHEXIDINE GLUCONATE CLOTH 2 % EX PADS: 6.0000 | MEDICATED_PAD | Freq: Every day | CUTANEOUS | Status: DC

## 2021-12-20 MED ORDER — ENSURE ENLIVE PO LIQD
237.0000 mL | ORAL | Status: DC
  Administered 2021-12-21 – 2021-12-26 (×6): 237 mL via ORAL

## 2021-12-20 MED ORDER — IOHEXOL 9 MG/ML PO SOLN
ORAL | Status: AC
  Administered 2021-12-20: 500 mL
  Filled 2021-12-20: qty 1000

## 2021-12-20 MED ORDER — SODIUM CHLORIDE 0.9 % IV SOLN
1.0000 g | INTRAVENOUS | Status: DC
  Administered 2021-12-20: 1 g via INTRAVENOUS
  Filled 2021-12-20 (×2): qty 10

## 2021-12-20 MED ORDER — SODIUM CHLORIDE 0.9 % IV SOLN: INTRAVENOUS | Status: DC

## 2021-12-20 MED ORDER — IOHEXOL 9 MG/ML PO SOLN
500.0000 mL | ORAL | Status: AC
  Administered 2021-12-20 (×2): 500 mL via ORAL

## 2021-12-20 MED ORDER — MUPIROCIN 2 % EX OINT
1.0000 "application " | TOPICAL_OINTMENT | Freq: Two times a day (BID) | CUTANEOUS | Status: AC
  Administered 2021-12-20 – 2021-12-24 (×10): 1 via NASAL
  Filled 2021-12-20 (×2): qty 22

## 2021-12-20 MED ORDER — METRONIDAZOLE 500 MG/100ML IV SOLN
500.0000 mg | Freq: Two times a day (BID) | INTRAVENOUS | Status: DC
  Administered 2021-12-20 – 2021-12-22 (×4): 500 mg via INTRAVENOUS
  Filled 2021-12-20 (×4): qty 100

## 2021-12-20 MED ORDER — OXYCODONE HCL 5 MG PO TABS
20.0000 mg | ORAL_TABLET | Freq: Four times a day (QID) | ORAL | Status: DC | PRN
  Administered 2021-12-20 – 2021-12-28 (×24): 20 mg via ORAL
  Filled 2021-12-20 (×24): qty 4

## 2021-12-20 NOTE — Assessment & Plan Note (Addendum)
-  Patient's Platelet Count went from 25 -> 25 -> 27 -> 22 -> 25 -> 28 -> 24 -> 38 -> 37 -> 26 -Had 2 apheresis of platelet on 5/27. -Transfuse for platelet < 10k or bleeding -Continue to Monitor for S/Sx of Bleeding; No overt bleeding noted  -Repeat CBC in the AM

## 2021-12-20 NOTE — Consult Note (Signed)
CC:   Requesting provider: AMS, shock, anemia  HPI: Derek Overley Koren [name]. is an 71 y.o. male with PMH of EtOH cirrhosis, nonimmune hemolytic anemia, pancytopenia, right AKA and hepatitis C brought to ED with altered mental status after he slid off his bed and found down on the ground by family, and admitted to ICU with hypovolemic shock, hemolytic anemia, liver failure, encephalopathy, AKI and hyperkalemia.  Pt was resuscitated in ICU and is now improving.  RUQ [place] concerning for liver mass.  MRI abd was obtained.  This showed gallbladder wall thickening with fat stranding, liver cirrhosis, signs of portal htn and possible small abscess or fistula between gallbladder and colon.  CT recommended by radiology.  This showed similar findings but limited evaluation due to lack of contrast.  Pt with normal wbc and vitals are stable.    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   Squamous cell carcinoma of skin 05/21/2018   R mid volar forearm   Thyroid disease    UROLITHIASIS, HX OF 12/23/2007    Past Surgical History:  Procedure Laterality Date   ABOVE KNEE LEG AMPUTATION Right    Right femur fracture, 11/1994, resulted in right AKA 2006 after staph infection   COLONOSCOPY N/A 12/16/2020   Procedure: COLONOSCOPY;  Surgeon: Virgel Manifold, MD;  Location: [facility] ENDOSCOPY;  Service: Endoscopy;  Laterality: N/A;   ESOPHAGOGASTRODUODENOSCOPY N/A 12/16/2020   Procedure: ESOPHAGOGASTRODUODENOSCOPY (EGD);  Surgeon: Virgel Manifold, MD;   Location: [facility] ENDOSCOPY;  Service: Endoscopy;  Laterality: N/A;    Family History  Problem Relation Age of Onset   Heart disease Mother    Lung cancer Mother 69   Heart attack Paternal Grandmother    Heart attack Paternal Grandfather    Liver cancer Neg Hx     Social:  reports that he has never smoked. He has never used smokeless tobacco. He reports that he does not currently use alcohol. He reports that he does not use drugs.  Allergies:  Allergies  Allergen Reactions   Clindamycin Dermatitis    Medications: I have reviewed the patient's current medications.  Results for orders placed or performed during the hospital encounter of 12/18/21 (from the past 48 hour(s))  Reticulocytes     Status: Abnormal   Collection Time: 12/18/21 10:07 PM  Result Value Ref Range   Retic Ct Pct 4.2 (H) 0.4 - 3.1 %   RBC. 2.06 (L) 4.22 - 5.81 MIL/uL   Retic Count, Absolute 86.9 19.0 - 186.0 K/uL   Immature Retic Fract 22.2 (H) 2.3 - 15.9 %    Comment: Performed at [facility], [place] [address], [place], [place] [address]  Blood gas, venous     Status: Abnormal   Collection Time: 12/18/21 10:07 PM  Result Value Ref Range   pH, Ven 7.46 (H) 7.25 - 7.43   pCO2, Ven 33 (L) 44 - 60 mmHg   pO2, Ven 47 (H) 32 - 45  mmHg   Bicarbonate 23.5 20.0 - 28.0 mmol/L   Acid-Base Excess 0.3 0.0 - 2.0 mmol/L   O2 Saturation 80.8 %   Patient temperature 37.0     Comment: Performed at [facility], [place] [address] [place], [place] [address]  CBC     Status: Abnormal   Collection Time: 12/18/21 10:07 PM  Result Value Ref Range   WBC 10.2 4.0 - 10.5 K/uL   RBC 2.04 (L) 4.22 - 5.81 MIL/uL   Hemoglobin 6.7 (LL) 13.0 - 17.0 g/dL    Comment: REPEATED TO VERIFY THIS CRITICAL RESULT HAS VERIFIED AND BEEN CALLED TO T,WALL BY AISHA MOHAMED ON 05 22 2023 AT 2304, AND HAS BEEN READ BACK.     HCT 20.1 (L) 39.0 - 52.0 %   MCV 98.5 80.0 - 100.0 fL   MCH 32.8 26.0 - 34.0 pg   MCHC  33.3 30.0 - 36.0 g/dL   RDW 19.9 (H) 11.5 - 15.5 %   Platelets 58 (L) 150 - 400 K/uL    Comment: Immature Platelet Fraction may be clinically indicated, consider ordering this additional test [id number] CONSISTENT WITH PREVIOUS RESULT REPEATED TO VERIFY    nRBC 0.2 0.0 - 0.2 %    Comment: Performed at [facility], [place] [address], [place], [place] [address]  Procalcitonin - Baseline     Status: None   Collection Time: 12/18/21 10:07 PM  Result Value Ref Range   Procalcitonin 2.03 ng/mL    Comment:        Interpretation: PCT > 2 ng/mL: Systemic infection (sepsis) is likely, unless other causes are known. (NOTE)       Sepsis PCT Algorithm           Lower Respiratory Tract                                      Infection PCT Algorithm    ----------------------------     ----------------------------         PCT < 0.25 ng/mL                PCT < 0.10 ng/mL          Strongly encourage             Strongly discourage   discontinuation of antibiotics    initiation of antibiotics    ----------------------------     -----------------------------       PCT 0.25 - 0.50 ng/mL            PCT 0.10 - 0.25 ng/mL               OR       >80% decrease in PCT            Discourage initiation of                                            antibiotics      Encourage discontinuation           of antibiotics    ----------------------------     -----------------------------         PCT >= 0.50 ng/mL              PCT 0.26 - 0.50 ng/mL  AND       <80% decrease in PCT              Encourage initiation of                                             antibiotics       Encourage continuation           of antibiotics    ----------------------------     -----------------------------        PCT >= 0.50 ng/mL                  PCT > 0.50 ng/mL               AND         increase in PCT                  Strongly encourage                                      initiation of  antibiotics    Strongly encourage escalation           of antibiotics                                     -----------------------------                                           PCT <= 0.25 ng/mL                                                 OR                                        > 80% decrease in PCT                                      Discontinue / Do not initiate                                             antibiotics  Performed at [place] [address], [place], [place] [address]   Prepare RBC (crossmatch)     Status: None   Collection Time: 12/18/21 11:18 PM  Result Value Ref Range   Order Confirmation      ORDER PROCESSED BY BLOOD BANK Performed at [place] [address] [place], [place] [address]   Procalcitonin     Status: None   Collection Time: 12/19/21  6:21 AM  Result Value Ref Range   Procalcitonin 3.83 ng/mL    Comment:  Interpretation: PCT > 2 ng/mL: Systemic infection (sepsis) is likely, unless other causes are known. (NOTE)       Sepsis PCT Algorithm           Lower Respiratory Tract                                      Infection PCT Algorithm    ----------------------------     ----------------------------         PCT < 0.25 ng/mL                PCT < 0.10 ng/mL          Strongly encourage             Strongly discourage   discontinuation of antibiotics    initiation of antibiotics    ----------------------------     -----------------------------       PCT 0.25 - 0.50 ng/mL            PCT 0.10 - 0.25 ng/mL               OR       >80% decrease in PCT            Discourage initiation of                                            antibiotics      Encourage discontinuation           of antibiotics    ----------------------------     -----------------------------         PCT >= 0.50 ng/mL              PCT 0.26 - 0.50 ng/mL               AND       <80% decrease in PCT              Encourage  initiation of                                             antibiotics       Encourage continuation           of antibiotics    ----------------------------     -----------------------------        PCT >= 0.50 ng/mL                  PCT > 0.50 ng/mL               AND         increase in PCT                  Strongly encourage                                      initiation of antibiotics    Strongly encourage escalation           of antibiotics                                     -----------------------------  PCT <= 0.25 ng/mL                                                 OR                                        > 80% decrease in PCT                                      Discontinue / Do not initiate                                             antibiotics  Performed at [place] [address] [place], [place] [address]   CBC     Status: Abnormal   Collection Time: 12/19/21  6:21 AM  Result Value Ref Range   WBC 6.9 4.0 - 10.5 K/uL   RBC 2.28 (L) 4.22 - 5.81 MIL/uL   Hemoglobin 7.1 (L) 13.0 - 17.0 g/dL   HCT 22.1 (L) 39.0 - 52.0 %   MCV 96.9 80.0 - 100.0 fL   MCH 31.1 26.0 - 34.0 pg   MCHC 32.1 30.0 - 36.0 g/dL   RDW 19.7 (H) 11.5 - 15.5 %   Platelets 40 (L) 150 - 400 K/uL    Comment: Immature Platelet Fraction may be clinically indicated, consider ordering this additional test [id number] CONSISTENT WITH PREVIOUS RESULT    nRBC 0.3 (H) 0.0 - 0.2 %    Comment: Performed at [facility], [place] [address], [place], [place] [address]  Magnesium     Status: None   Collection Time: 12/19/21  6:21 AM  Result Value Ref Range   Magnesium 1.8 1.7 - 2.4 mg/dL    Comment: Performed at [facility], [place] [address] [place], [place] [address]  Phosphorus     Status: Abnormal   Collection Time: 12/19/21  6:21 AM  Result Value Ref Range   Phosphorus 4.7 (H) 2.5 - 4.6 mg/dL     Comment: Performed at [facility], [place] [address] [place], [place] [address]  Lactic acid, plasma     Status: Abnormal   Collection Time: 12/19/21  6:21 AM  Result Value Ref Range   Lactic Acid, Venous 2.6 (HH) 0.5 - 1.9 mmol/L    Comment: CRITICAL VALUE NOTED.  VALUE IS CONSISTENT WITH PREVIOUSLY REPORTED AND CALLED VALUE. Performed at [facility], [place] [address], [place], [place] [address]   Comprehensive metabolic panel     Status: Abnormal   Collection Time: 12/19/21 10:30 AM  Result Value Ref Range   Sodium 135 135 - 145 mmol/L   Potassium 4.5 3.5 - 5.1 mmol/L   Chloride 102 98 - 111 mmol/L   CO2 21 (L) 22 - 32 mmol/L   Glucose, Bld 208 (H) 70 - 99 mg/dL    Comment: Glucose reference range applies only to samples taken after fasting for at least 8 hours.   BUN 56 (H) 8 - 23 mg/dL   Creatinine,  Ser 1.70 (H) 0.61 - 1.24 mg/dL   Calcium 7.6 (L) 8.9 - 10.3 mg/dL   Total Protein 5.9 (L) 6.5 - 8.1 g/dL   Albumin 2.7 (L) 3.5 - 5.0 g/dL   AST 1,436 (H) 15 - 41 U/L   ALT 627 (H) 0 - 44 U/L   Alkaline Phosphatase 69 38 - 126 U/L   Total Bilirubin 5.6 (H) 0.3 - 1.2 mg/dL   GFR, Estimated 42 (L) >60 mL/min    Comment: (NOTE) Calculated using the CKD-EPI Creatinine Equation (2021)    Anion gap 12 5 - 15    Comment: Performed at [facility], [place] [address], [place], [place] [address]  Troponin I (High Sensitivity)     Status: Abnormal   Collection Time: 12/19/21 10:30 AM  Result Value Ref Range   Troponin I (High Sensitivity) 113 (HH) <18 ng/L    Comment: CRITICAL VALUE NOTED.  VALUE IS CONSISTENT WITH PREVIOUSLY REPORTED AND CALLED VALUE. (NOTE) Elevated high sensitivity troponin I (hsTnI) values and significant  changes across serial measurements may suggest ACS but many other  chronic and acute conditions are known to elevate hsTnI results.  Refer to the Links section for chest pain algorithms and additional   guidance. Performed at [facility], [place] [address] [place], [place] [address]   Bilirubin, direct     Status: Abnormal   Collection Time: 12/19/21 10:30 AM  Result Value Ref Range   Bilirubin, Direct 1.4 (H) 0.0 - 0.2 mg/dL    Comment: Performed at [facility], [place] [address] [place], [place] [address]  Save Smear for Provider Slide Review     Status: None   Collection Time: 12/19/21 11:30 AM  Result Value Ref Range   Smear Review SMEAR STAINED AND AVAILABLE FOR REVIEW     Comment: Performed at [facility], [place] [address] [place], [place] [address]  CBC     Status: Abnormal   Collection Time: 12/19/21 11:51 AM  Result Value Ref Range   WBC 8.2 4.0 - 10.5 K/uL   RBC 2.23 (L) 4.22 - 5.81 MIL/uL   Hemoglobin 7.2 (L) 13.0 - 17.0 g/dL   HCT 21.3 (L) 39.0 - 52.0 %   MCV 95.5 80.0 - 100.0 fL   MCH 32.3 26.0 - 34.0 pg   MCHC 33.8 30.0 - 36.0 g/dL   RDW 20.1 (H) 11.5 - 15.5 %   Platelets 38 (L) 150 - 400 K/uL    Comment: SPECIMEN CHECKED FOR CLOTS Immature Platelet Fraction may be clinically indicated, consider ordering this additional test [id number] CONSISTENT WITH PREVIOUS RESULT REPEATED TO VERIFY    nRBC 0.4 (H) 0.0 - 0.2 %    Comment: Performed at [facility], [place] [address] [place], [place] [address]  Procalcitonin     Status: None   Collection Time: 12/20/21  2:23 AM  Result Value Ref Range   Procalcitonin 3.19 ng/mL    Comment:        Interpretation: PCT > 2 ng/mL: Systemic infection (sepsis) is likely, unless other causes are known. (NOTE)       Sepsis PCT Algorithm           Lower Respiratory Tract                                      Infection PCT Algorithm    ----------------------------     ----------------------------  PCT < 0.25 ng/mL                PCT < 0.10 ng/mL          Strongly encourage             Strongly discourage   discontinuation of antibiotics     initiation of antibiotics    ----------------------------     -----------------------------       PCT 0.25 - 0.50 ng/mL            PCT 0.10 - 0.25 ng/mL               OR       >80% decrease in PCT            Discourage initiation of                                            antibiotics      Encourage discontinuation           of antibiotics    ----------------------------     -----------------------------         PCT >= 0.50 ng/mL              PCT 0.26 - 0.50 ng/mL               AND       <80% decrease in PCT              Encourage initiation of                                             antibiotics       Encourage continuation           of antibiotics    ----------------------------     -----------------------------        PCT >= 0.50 ng/mL                  PCT > 0.50 ng/mL               AND         increase in PCT                  Strongly encourage                                      initiation of antibiotics    Strongly encourage escalation           of antibiotics                                     -----------------------------                                           PCT <= 0.25 ng/mL  OR                                        > 80% decrease in PCT                                      Discontinue / Do not initiate                                             antibiotics  Performed at [place] [address], [place], [place] [address]   CBC     Status: Abnormal   Collection Time: 12/20/21  2:23 AM  Result Value Ref Range   WBC 8.6 4.0 - 10.5 K/uL   RBC 2.14 (L) 4.22 - 5.81 MIL/uL   Hemoglobin 6.7 (LL) 13.0 - 17.0 g/dL    Comment: This critical result has verified and been called to DOSTER,S by VAZQUEZ,Harding on 05 24 2023 at 0244, and has been read back. CRITICAL RESULTS CALLED   HCT 20.3 (L) 39.0 - 52.0 %   MCV 94.9 80.0 - 100.0 fL   MCH 31.3 26.0 - 34.0 pg   MCHC 33.0 30.0 - 36.0 g/dL   RDW  20.4 (H) 11.5 - 15.5 %   Platelets 31 (L) 150 - 400 K/uL    Comment: SPECIMEN CHECKED FOR CLOTS Immature Platelet Fraction may be clinically indicated, consider ordering this additional test [id number] CONSISTENT WITH PREVIOUS RESULT    nRBC 0.4 (H) 0.0 - 0.2 %    Comment: Performed at [facility], [place] [address] [place], [place] [address]  Comprehensive metabolic panel     Status: Abnormal   Collection Time: 12/20/21  2:23 AM  Result Value Ref Range   Sodium 134 (L) 135 - 145 mmol/L   Potassium 3.9 3.5 - 5.1 mmol/L   Chloride 103 98 - 111 mmol/L   CO2 21 (L) 22 - 32 mmol/L   Glucose, Bld 203 (H) 70 - 99 mg/dL    Comment: Glucose reference range applies only to samples taken after fasting for at least 8 hours.   BUN 63 (H) 8 - 23 mg/dL   Creatinine, Ser 1.89 (H) 0.61 - 1.24 mg/dL   Calcium 7.8 (L) 8.9 - 10.3 mg/dL   Total Protein 5.7 (L) 6.5 - 8.1 g/dL   Albumin 2.5 (L) 3.5 - 5.0 g/dL   AST 678 (H) 15 - 41 U/L   ALT 487 (H) 0 - 44 U/L   Alkaline Phosphatase 71 38 - 126 U/L   Total Bilirubin 3.9 (H) 0.3 - 1.2 mg/dL   GFR, Estimated 37 (L) >60 mL/min    Comment: (NOTE) Calculated using the CKD-EPI Creatinine Equation (2021)    Anion gap 10 5 - 15    Comment: Performed at [facility], [place] [address], [place], [place] [address]  Magnesium     Status: None   Collection Time: 12/20/21  2:23 AM  Result Value Ref Range   Magnesium 2.3 1.7 - 2.4 mg/dL    Comment: Performed at [facility], [place] [address], [place], [place] [address]  Prepare RBC (crossmatch)     Status: None  Collection Time: 12/20/21  2:56 AM  Result Value Ref Range   Order Confirmation      ORDER PROCESSED BY BLOOD BANK Performed at [facility], [place] [address], [place], [place] [address]   Hemoglobin and hematocrit, blood     Status: Abnormal   Collection Time: 12/20/21  8:01 AM  Result Value Ref Range   Hemoglobin 9.6 (L) 13.0 -  17.0 g/dL    Comment: REPEATED TO VERIFY POST TRANSFUSION SPECIMEN    HCT 29.0 (L) 39.0 - 52.0 %    Comment: Performed at [facility], [place] [address] [place], [place] [address]  CK     Status: Abnormal   Collection Time: 12/20/21  8:01 AM  Result Value Ref Range   Total CK 4,231 (H) 49 - 397 U/L    Comment: RESULTS CONFIRMED BY MANUAL DILUTION Performed at [facility], [place] [address], [place], [place] [address]   Hemoglobin A1c     Status: None   Collection Time: 12/20/21  8:01 AM  Result Value Ref Range   Hgb A1c MFr Bld 4.9 4.8 - 5.6 %    Comment: (NOTE) Pre diabetes:          5.7%-6.4%  Diabetes:              >6.4%  Glycemic control for   <7.0% adults with diabetes    Mean Plasma Glucose 93.93 mg/dL    Comment: Performed at [place] [address], [place], [place] [address]  MRSA Next Gen by PCR, Nasal     Status: Abnormal   Collection Time: 12/20/21  9:40 AM   Specimen: Nasal Mucosa; Nasal Swab  Result Value Ref Range   MRSA by PCR Next Gen DETECTED (A) NOT DETECTED    Comment: (NOTE) The GeneXpert MRSA Assay (FDA approved for NASAL specimens only), is one component of a comprehensive MRSA colonization surveillance program. It is not intended to diagnose MRSA infection nor to guide or monitor treatment for MRSA infections. Test performance is not FDA approved in patients less than 66 years old. Performed at [facility], [place] [address], [place], [place] [address]     CT ABDOMEN PELVIS WO CONTRAST  Result Date: 12/20/2021 CLINICAL DATA:  Acute abdominal pain EXAM: CT ABDOMEN AND PELVIS WITHOUT CONTRAST TECHNIQUE: Multidetector CT imaging of the abdomen and pelvis was performed following the standard protocol without IV contrast. RADIATION DOSE REDUCTION: This exam was performed according to the departmental dose-optimization program which includes automated exposure control, adjustment of the mA  and/or kV according to patient size and/or use of iterative reconstruction technique. COMPARISON:  MRI abdomen dated 12/20/2021. Right upper quadrant ultrasound dated 12/18/2021. FINDINGS: Lower chest: Mild left basilar atelectasis. Hepatobiliary: Macronodular hepatic contour raises the possibility of cirrhosis. No focal hepatic lesion on unenhanced CT. Cholelithiasis with gallbladder distension and pericholecystic inflammatory changes, reflecting acute cholecystitis. Secondary inflammatory changes along the serosal aspect of the colon at the hepatic flexure (series 2/image 36). Localized perforation with fistulous communication is suggested on MR but is poorly evaluated on unenhanced CT (series 2/image 35). No intrahepatic or extrahepatic ductal dilatation. Pancreas: Within normal limits. Spleen: Mildly enlarged, measuring 16.4 cm in maximal craniocaudal dimension. Adrenals/Urinary Tract: Adrenal glands are within normal limits. Kidneys are within normal limits. No renal, ureteral, or bladder calculi. No hydronephrosis. Bladder is mildly thick-walled although underdistended. Stomach/Bowel: Stomach is within normal limits. No evidence of bowel obstruction. Normal appendix (series 2/image  61). Serosal wall thickening/inflammatory changes along the hepatic flexure, as described above. Mild left colonic diverticulosis, without evidence of diverticulitis. Vascular/Lymphatic: No evidence of abdominal aortic aneurysm. Mild perigastric varices. Atherosclerotic calcifications of the abdominal aorta and branch vessels. No suspicious abdominopelvic lymphadenopathy. Reproductive: Prostate is unremarkable. Other: No abdominopelvic ascites. No free air. Musculoskeletal: Degenerative changes of the visualized thoracolumbar spine. Mild superior endplate changes at L2, likely chronic. IMPRESSION: Cholelithiasis with acute cholecystitis. Suspected localized perforation with secondary inflammatory changes involving the hepatic  flexure of the colon, better evaluated on recent MRI. Surgical consultation is suggested. Secondary inflammatory changes along the serosal aspect of the colon at the hepatic flexure. Localized perforation with fistulous communication is suggested on MR but is poorly evaluated on unenhanced CT. Suspected cirrhosis. No focal hepatic lesion on unenhanced CT. Mild splenomegaly and perigastric varices. These results will be called to the ordering clinician or representative by the Radiologist Assistant, and communication documented in the PACS or [employer]. Electronically Signed   By: Julian Hy M.D.   On: 12/20/2021 17:16   DG Abd 1 View  Result Date: 12/18/2021 CLINICAL DATA:  Enteric catheter placement EXAM: ABDOMEN - 1 VIEW COMPARISON:  None Available. FINDINGS: Frontal view of the lower chest and upper abdomen demonstrates a weighted tip of an enteric feeding catheter projecting over the gastric antrum. Bowel gas pattern is unremarkable. IMPRESSION: 1. Enteric catheter tip projecting over the gastric antrum. Electronically Signed   By: Randa Ngo M.D.   On: 12/18/2021 19:25   CT HEAD WO CONTRAST (5MM)  Result Date: 12/18/2021 CLINICAL DATA:  Altered mental status EXAM: CT HEAD WITHOUT CONTRAST TECHNIQUE: Contiguous axial images were obtained from the base of the skull through the vertex without intravenous contrast. RADIATION DOSE REDUCTION: This exam was performed according to the departmental dose-optimization program which includes automated exposure control, adjustment of the mA and/or kV according to patient size and/or use of iterative reconstruction technique. COMPARISON:  12/21/2019 FINDINGS: Brain: No evidence of acute infarction, hemorrhage, cerebral edema, mass, mass effect, or midline shift. No hydrocephalus or extra-axial fluid collection. Vascular: No hyperdense vessel. Skull: Normal. Negative for fracture or focal lesion. Sinuses/Orbits: No acute finding. Other: The mastoid  air cells are well aerated. IMPRESSION: No acute intracranial process. Electronically Signed   By: Merilyn Baba M.D.   On: 12/18/2021 21:43   MR LIVER WO CONRTAST  Result Date: 12/20/2021 CLINICAL DATA:  Evaluate for hepatocellular carcinoma. EXAM: MRI ABDOMEN WITHOUT CONTRAST TECHNIQUE: Multiplanar multisequence MR imaging was performed without the administration of intravenous contrast. COMPARISON:  Abdominal sonogram 12/18/2021 FINDINGS: Lower chest: Atelectasis versus airspace disease noted within the left base. Hepatobiliary: Diffuse low signal throughout the liver on the in-phase sequence which increases in signal intensity on the out of phase sequences suggest iron deposition within the liver. There is hypertrophy of the lateral segment of left hepatic lobe as well as the caudate lobe. Contour the liver has a macro nodular appearance compatible with cirrhosis. Evaluation for underlying mass lesion is significantly limited due to lack of IV contrast material and respiratory motion artifact. Furthermore, patient was unable to breath hold and requested early termination of the exam. Within this limitation no large liver mass identified. Multiple stones are identified within the gallbladder. These all measure around 4 mm. The gallbladder wall appears diffusely edematous with surrounding inflammatory fat stranding. There is a focal fluid collection which extends beyond the normal margins of the anterior wall of the gallbladder abutting the hepatic flexure, image 25/4  and image 14/6. As mentioned above examination is significantly limited reflecting motion artifact and lack of IV contrast material. No signs of common bile duct or intrahepatic bile duct dilatation. Pancreas:  No acute abnormality. Spleen:  The spleen is enlarged measuring 16.9 cm cranial caudal. Adrenals/Urinary Tract: Normal appearance of the adrenal glands. No kidney mass or signs of hydronephrosis. Stomach/Bowel: No abnormal bowel dilatation  identified. Vascular/Lymphatic: No pathologically enlarged lymph nodes identified. No abdominal aortic aneurysm demonstrated. Other: There is free fluid identified within the upper abdomen extending over the liver dome. Musculoskeletal: Asymmetric increased signal intensity is identified localizing to the visualized portions of the left gluteus musculature, image 46/4. IMPRESSION: 1. Examination is limited due to lack of IV contrast material and respiratory motion artifact. Within this limitation no large liver mass identified. However study is not diagnostic for excluding 8 cc in a patient who is at increased risk. 2. Morphologic features of the liver compatible with cirrhosis with stigmata of portal venous hypertension including splenomegaly and ascites. 3. Gallstones with diffuse gallbladder wall thickening and surrounding inflammatory fat stranding. A small volume of free fluid is identified extending over the dome of liver. There is a focal fluid collection which extends beyond the normal margins of the anterior wall of the gallbladder abutting the hepatic flexure. This may represent a small abscess or focal perforation of the gallbladder. Cholecystocolonic fistula is also a potential consideration. Further evaluation with CT of the abdomen pelvis is recommended. 4. Asymmetric increased signal intensity identified localizing to the visualized portions of the left gluteus musculature. This is only partially visualized and is of uncertain significance. Attention on follow-up CT is advised. 5. Critical Value/emergent results were called by telephone at the time of interpretation on 12/20/2021 at 11:45 am to provider [facility] , who verbally acknowledged these results. Electronically Signed   By: Kerby Moors M.D.   On: 12/20/2021 11:46   ECHOCARDIOGRAM COMPLETE  Result Date: 12/19/2021    ECHOCARDIOGRAM REPORT   Patient Name:   Derek Bitter Neely [name]. Date of Exam: 12/19/2021 Medical Rec #:  [id number]             Height:       72.0 in Accession #:    [id number]           Weight:       196.2 lb Date of Birth:  1950/10/01            BSA:          2.113 m Patient Age:    19 years             BP:           137/76 mmHg Patient Gender: M                    HR:           89 bpm. Exam Location:  Inpatient Procedure: 2D Echo, Cardiac Doppler and Color Doppler Indications:    Elevated troponin  History:        Patient has prior history of Echocardiogram examinations, most                 recent 04/09/2018. Risk Factors:Hypertension.  Sonographer:    Jefferey Pica Referring Phys: [id number] [place]  1. Left ventricular ejection fraction, by estimation, is 55 to 60%. The left ventricle has normal function. The left ventricle demonstrates regional wall motion abnormalities (basal septal hypokinesis). There is moderate  concentric left ventricular hypertrophy. Left ventricular diastolic parameters are consistent with Grade I diastolic dysfunction (impaired relaxation). Qualitatively LV appears slightly underfilled.  2. Right ventricular systolic function is normal. The right ventricular size is normal. Tricuspid regurgitation signal is inadequate for assessing PA pressure.  3. The mitral valve is normal in structure. No evidence of mitral valve regurgitation. No evidence of mitral stenosis.  4. The aortic valve was not well visualized. There is mild calcification of the aortic valve. Aortic valve regurgitation is not visualized.  5. The inferior vena cava is normal in size with greater than 50% respiratory variability, suggesting right atrial pressure of 3 mmHg. Comparison(s): Prior images reviewed side by side. Wall motion abnormalities are new from prior: prior study was not performed off-axis. LV size was normal in prior study. FINDINGS  Left Ventricle: Left ventricular ejection fraction, by estimation, is 55 to 60%. The left ventricle has normal function. The left ventricle demonstrates regional wall motion  abnormalities. The left ventricular internal cavity size was normal in size. There is moderate concentric left ventricular hypertrophy. Left ventricular diastolic parameters are consistent with Grade I diastolic dysfunction (impaired relaxation).  LV Wall Scoring: The basal anteroseptal segment, mid inferoseptal segment, and basal inferoseptal segment are hypokinetic. Right Ventricle: The right ventricular size is normal. No increase in right ventricular wall thickness. Right ventricular systolic function is normal. Tricuspid regurgitation signal is inadequate for assessing PA pressure. Left Atrium: Left atrial size was normal in size. Right Atrium: Right atrial size was normal in size. Pericardium: Trivial pericardial effusion is present. Presence of epicardial fat layer. Mitral Valve: The mitral valve is normal in structure. No evidence of mitral valve regurgitation. No evidence of mitral valve stenosis. Tricuspid Valve: The tricuspid valve is normal in structure. Tricuspid valve regurgitation is not demonstrated. No evidence of tricuspid stenosis. Aortic Valve: The aortic valve was not well visualized. There is mild calcification of the aortic valve. Aortic valve regurgitation is not visualized. Aortic valve peak gradient measures 8.5 mmHg. Pulmonic Valve: The pulmonic valve was not well visualized. Pulmonic valve regurgitation is not visualized. Aorta: The aortic root and ascending aorta are structurally normal, with no evidence of dilitation. Venous: The inferior vena cava is normal in size with greater than 50% respiratory variability, suggesting right atrial pressure of 3 mmHg. IAS/Shunts: No atrial level shunt detected by color flow Doppler.  LEFT VENTRICLE PLAX 2D LVIDd:         5.10 cm LVIDs:         3.30 cm LV PW:         1.50 cm LV IVS:        1.50 cm LVOT diam:     2.10 cm LV SV:         51 LV SV Index:   24 LVOT Area:     3.46 cm  IVC IVC diam: 1.80 cm LEFT ATRIUM           Index        RIGHT ATRIUM            Index LA diam:      3.30 cm 1.56 cm/m   RA Area:     16.60 cm LA Vol (A4C): 57.0 ml 26.97 ml/m  RA Volume:   48.90 ml  23.14 ml/m  AORTIC VALVE                 PULMONIC VALVE AV Area (Vmax): 2.28 cm     PV Vmax:  1.06 m/s AV Vmax:        146.00 cm/s  PV Peak grad:  4.5 mmHg AV Peak Grad:   8.5 mmHg LVOT Vmax:      96.00 cm/s LVOT Vmean:     55.600 cm/s LVOT VTI:       0.146 m  AORTA Ao Root diam: 3.70 cm Ao Asc diam:  3.40 cm MITRAL VALVE MV Area (PHT): 3.63 cm    SHUNTS MV Decel Time: 209 msec    Systemic VTI:  0.15 m MV E velocity: 44.60 cm/s  Systemic Diam: 2.10 cm MV A velocity: 56.60 cm/s MV E/A ratio:  0.79 Rudean Haskell MD Electronically signed by Rudean Haskell MD Signature Date/Time: 12/19/2021/8:46:06 AM    Final     ROS - all of the below systems have been reviewed with the patient and positives are indicated with bold text General: chills, fever or night sweats Eyes: blurry vision or double vision ENT: epistaxis or sore throat Hematologic/Lymphatic: bleeding problems, blood clots or swollen lymph nodes Endocrine: temperature intolerance or unexpected weight changes Breast: new or changing breast lumps or nipple discharge Resp: cough, shortness of breath, or wheezing CV: chest pain or dyspnea on exertion GI: as per HPI GU: dysuria, trouble voiding, or hematuria Neuro: TIA or stroke symptoms    PE Blood pressure (!) 145/52, pulse 76, temperature 98.9 F (37.2 C), temperature source Oral, resp. rate 19, height 6' (1.829 m), weight 90.7 kg, SpO2 96 %. Constitutional: NAD; conversant; no deformities Eyes: Moist conjunctiva; no lid lag; anicteric; PERRL Neck: Trachea midline; no thyromegaly Lungs: Normal respiratory effort CV: RRR GI: Abd soft, nontender to palpation  Results for orders placed or performed during the hospital encounter of 12/18/21 (from the past 48 hour(s))  Reticulocytes     Status: Abnormal   Collection Time: 12/18/21 10:07 PM   Result Value Ref Range   Retic Ct Pct 4.2 (H) 0.4 - 3.1 %   RBC. 2.06 (L) 4.22 - 5.81 MIL/uL   Retic Count, Absolute 86.9 19.0 - 186.0 K/uL   Immature Retic Fract 22.2 (H) 2.3 - 15.9 %    Comment: Performed at [facility], [place] [address], [place], [place] [address]  Blood gas, venous     Status: Abnormal   Collection Time: 12/18/21 10:07 PM  Result Value Ref Range   pH, Ven 7.46 (H) 7.25 - 7.43   pCO2, Ven 33 (L) 44 - 60 mmHg   pO2, Ven 47 (H) 32 - 45 mmHg   Bicarbonate 23.5 20.0 - 28.0 mmol/L   Acid-Base Excess 0.3 0.0 - 2.0 mmol/L   O2 Saturation 80.8 %   Patient temperature 37.0     Comment: Performed at [facility], [place] [address], [place], [place] [address]  CBC     Status: Abnormal   Collection Time: 12/18/21 10:07 PM  Result Value Ref Range   WBC 10.2 4.0 - 10.5 K/uL   RBC 2.04 (L) 4.22 - 5.81 MIL/uL   Hemoglobin 6.7 (LL) 13.0 - 17.0 g/dL    Comment: REPEATED TO VERIFY THIS CRITICAL RESULT HAS VERIFIED AND BEEN CALLED TO T,WALL BY AISHA MOHAMED ON 05 22 2023 AT 2304, AND HAS BEEN READ BACK.     HCT 20.1 (L) 39.0 - 52.0 %   MCV 98.5 80.0 - 100.0 fL   MCH 32.8 26.0 - 34.0 pg   MCHC 33.3 30.0 - 36.0 g/dL   RDW 19.9 (H) 11.5 - 15.5 %  Platelets 58 (L) 150 - 400 K/uL    Comment: Immature Platelet Fraction may be clinically indicated, consider ordering this additional test [id number] CONSISTENT WITH PREVIOUS RESULT REPEATED TO VERIFY    nRBC 0.2 0.0 - 0.2 %    Comment: Performed at [facility], [place] [address] [place], [place] [address]  Procalcitonin - Baseline     Status: None   Collection Time: 12/18/21 10:07 PM  Result Value Ref Range   Procalcitonin 2.03 ng/mL    Comment:        Interpretation: PCT > 2 ng/mL: Systemic infection (sepsis) is likely, unless other causes are known. (NOTE)       Sepsis PCT Algorithm           Lower Respiratory Tract                                      Infection PCT  Algorithm    ----------------------------     ----------------------------         PCT < 0.25 ng/mL                PCT < 0.10 ng/mL          Strongly encourage             Strongly discourage   discontinuation of antibiotics    initiation of antibiotics    ----------------------------     -----------------------------       PCT 0.25 - 0.50 ng/mL            PCT 0.10 - 0.25 ng/mL               OR       >80% decrease in PCT            Discourage initiation of                                            antibiotics      Encourage discontinuation           of antibiotics    ----------------------------     -----------------------------         PCT >= 0.50 ng/mL              PCT 0.26 - 0.50 ng/mL               AND       <80% decrease in PCT              Encourage initiation of                                             antibiotics       Encourage continuation           of antibiotics    ----------------------------     -----------------------------        PCT >= 0.50 ng/mL                  PCT > 0.50 ng/mL               AND         increase in PCT  Strongly encourage                                      initiation of antibiotics    Strongly encourage escalation           of antibiotics                                     -----------------------------                                           PCT <= 0.25 ng/mL                                                 OR                                        > 80% decrease in PCT                                      Discontinue / Do not initiate                                             antibiotics  Performed at [place] [address] [place], [place] [address]   Prepare RBC (crossmatch)     Status: None   Collection Time: 12/18/21 11:18 PM  Result Value Ref Range   Order Confirmation      ORDER PROCESSED BY BLOOD BANK Performed at [place] [address], [place],  [place] [address]   Procalcitonin     Status: None   Collection Time: 12/19/21  6:21 AM  Result Value Ref Range   Procalcitonin 3.83 ng/mL    Comment:        Interpretation: PCT > 2 ng/mL: Systemic infection (sepsis) is likely, unless other causes are known. (NOTE)       Sepsis PCT Algorithm           Lower Respiratory Tract                                      Infection PCT Algorithm    ----------------------------     ----------------------------         PCT < 0.25 ng/mL                PCT < 0.10 ng/mL          Strongly encourage             Strongly discourage   discontinuation of antibiotics    initiation of antibiotics    ----------------------------     -----------------------------       PCT 0.25 - 0.50 ng/mL  PCT 0.10 - 0.25 ng/mL               OR       >80% decrease in PCT            Discourage initiation of                                            antibiotics      Encourage discontinuation           of antibiotics    ----------------------------     -----------------------------         PCT >= 0.50 ng/mL              PCT 0.26 - 0.50 ng/mL               AND       <80% decrease in PCT              Encourage initiation of                                             antibiotics       Encourage continuation           of antibiotics    ----------------------------     -----------------------------        PCT >= 0.50 ng/mL                  PCT > 0.50 ng/mL               AND         increase in PCT                  Strongly encourage                                      initiation of antibiotics    Strongly encourage escalation           of antibiotics                                     -----------------------------                                           PCT <= 0.25 ng/mL                                                 OR                                        > 80% decrease in PCT  Discontinue / Do not initiate                                              antibiotics  Performed at [place] [address] [place], [place] [address]   CBC     Status: Abnormal   Collection Time: 12/19/21  6:21 AM  Result Value Ref Range   WBC 6.9 4.0 - 10.5 K/uL   RBC 2.28 (L) 4.22 - 5.81 MIL/uL   Hemoglobin 7.1 (L) 13.0 - 17.0 g/dL   HCT 22.1 (L) 39.0 - 52.0 %   MCV 96.9 80.0 - 100.0 fL   MCH 31.1 26.0 - 34.0 pg   MCHC 32.1 30.0 - 36.0 g/dL   RDW 19.7 (H) 11.5 - 15.5 %   Platelets 40 (L) 150 - 400 K/uL    Comment: Immature Platelet Fraction may be clinically indicated, consider ordering this additional test [id number] CONSISTENT WITH PREVIOUS RESULT    nRBC 0.3 (H) 0.0 - 0.2 %    Comment: Performed at [facility], [place] [address], [place], [place] [address]  Magnesium     Status: None   Collection Time: 12/19/21  6:21 AM  Result Value Ref Range   Magnesium 1.8 1.7 - 2.4 mg/dL    Comment: Performed at [facility] [place] [address] [place], [place] [address]  Phosphorus     Status: Abnormal   Collection Time: 12/19/21  6:21 AM  Result Value Ref Range   Phosphorus 4.7 (H) 2.5 - 4.6 mg/dL    Comment: Performed at [facility], [place] [address], [place], [place] [address]  Lactic acid, plasma     Status: Abnormal   Collection Time: 12/19/21  6:21 AM  Result Value Ref Range   Lactic Acid, Venous 2.6 (HH) 0.5 - 1.9 mmol/L    Comment: CRITICAL VALUE NOTED.  VALUE IS CONSISTENT WITH PREVIOUSLY REPORTED AND CALLED VALUE. Performed at [facility], [place] [address] [place], [place] [address]   Comprehensive metabolic panel     Status: Abnormal   Collection Time: 12/19/21 10:30 AM  Result Value Ref Range   Sodium 135 135 - 145 mmol/L   Potassium 4.5 3.5 - 5.1 mmol/L   Chloride 102 98 - 111 mmol/L   CO2 21 (L) 22 - 32 mmol/L   Glucose, Bld 208 (H) 70 - 99 mg/dL    Comment: Glucose reference range applies only to samples taken  after fasting for at least 8 hours.   BUN 56 (H) 8 - 23 mg/dL   Creatinine, Ser 1.70 (H) 0.61 - 1.24 mg/dL   Calcium 7.6 (L) 8.9 - 10.3 mg/dL   Total Protein 5.9 (L) 6.5 - 8.1 g/dL   Albumin 2.7 (L) 3.5 - 5.0 g/dL   AST 1,436 (H) 15 - 41 U/L   ALT 627 (H) 0 - 44 U/L   Alkaline Phosphatase 69 38 - 126 U/L   Total Bilirubin 5.6 (H) 0.3 - 1.2 mg/dL   GFR, Estimated 42 (L) >60 mL/min    Comment: (NOTE) Calculated using the CKD-EPI Creatinine Equation (2021)    Anion gap 12 5 - 15    Comment: Performed at [facility], [place] [address] [place], [place] [address]  Troponin I (High Sensitivity)     Status: Abnormal   Collection Time:  12/19/21 10:30 AM  Result Value Ref Range   Troponin I (High Sensitivity) 113 (HH) <18 ng/L    Comment: CRITICAL VALUE NOTED.  VALUE IS CONSISTENT WITH PREVIOUSLY REPORTED AND CALLED VALUE. (NOTE) Elevated high sensitivity troponin I (hsTnI) values and significant  changes across serial measurements may suggest ACS but many other  chronic and acute conditions are known to elevate hsTnI results.  Refer to the Links section for chest pain algorithms and additional  guidance. Performed at [facility], [place] [address] [place], [place] [address]   Bilirubin, direct     Status: Abnormal   Collection Time: 12/19/21 10:30 AM  Result Value Ref Range   Bilirubin, Direct 1.4 (H) 0.0 - 0.2 mg/dL    Comment: Performed at [facility], [place] [address], [place], [place] [address]  Save Smear for Provider Slide Review     Status: None   Collection Time: 12/19/21 11:30 AM  Result Value Ref Range   Smear Review SMEAR STAINED AND AVAILABLE FOR REVIEW     Comment: Performed at [facility], [place] [address], [place], [place] [address]  CBC     Status: Abnormal   Collection Time: 12/19/21 11:51 AM  Result Value Ref Range   WBC 8.2 4.0 - 10.5 K/uL   RBC 2.23 (L) 4.22 - 5.81 MIL/uL   Hemoglobin 7.2  (L) 13.0 - 17.0 g/dL   HCT 21.3 (L) 39.0 - 52.0 %   MCV 95.5 80.0 - 100.0 fL   MCH 32.3 26.0 - 34.0 pg   MCHC 33.8 30.0 - 36.0 g/dL   RDW 20.1 (H) 11.5 - 15.5 %   Platelets 38 (L) 150 - 400 K/uL    Comment: SPECIMEN CHECKED FOR CLOTS Immature Platelet Fraction may be clinically indicated, consider ordering this additional test [id number] CONSISTENT WITH PREVIOUS RESULT REPEATED TO VERIFY    nRBC 0.4 (H) 0.0 - 0.2 %    Comment: Performed at [facility], [place] [address] [place], [place] [address]  Procalcitonin     Status: None   Collection Time: 12/20/21  2:23 AM  Result Value Ref Range   Procalcitonin 3.19 ng/mL    Comment:        Interpretation: PCT > 2 ng/mL: Systemic infection (sepsis) is likely, unless other causes are known. (NOTE)       Sepsis PCT Algorithm           Lower Respiratory Tract                                      Infection PCT Algorithm    ----------------------------     ----------------------------         PCT < 0.25 ng/mL                PCT < 0.10 ng/mL          Strongly encourage             Strongly discourage   discontinuation of antibiotics    initiation of antibiotics    ----------------------------     -----------------------------       PCT 0.25 - 0.50 ng/mL            PCT 0.10 - 0.25 ng/mL               OR       >80% decrease in  PCT            Discourage initiation of                                            antibiotics      Encourage discontinuation           of antibiotics    ----------------------------     -----------------------------         PCT >= 0.50 ng/mL              PCT 0.26 - 0.50 ng/mL               AND       <80% decrease in PCT              Encourage initiation of                                             antibiotics       Encourage continuation           of antibiotics    ----------------------------     -----------------------------        PCT >= 0.50 ng/mL                  PCT > 0.50 ng/mL                AND         increase in PCT                  Strongly encourage                                      initiation of antibiotics    Strongly encourage escalation           of antibiotics                                     -----------------------------                                           PCT <= 0.25 ng/mL                                                 OR                                        > 80% decrease in PCT                                      Discontinue / Do not initiate  antibiotics  Performed at [facility], [place] [address], [place], [place] [address]   CBC     Status: Abnormal   Collection Time: 12/20/21  2:23 AM  Result Value Ref Range   WBC 8.6 4.0 - 10.5 K/uL   RBC 2.14 (L) 4.22 - 5.81 MIL/uL   Hemoglobin 6.7 (LL) 13.0 - 17.0 g/dL    Comment: This critical result has verified and been called to DOSTER,S by VAZQUEZ,Rochelle on 05 24 2023 at 0244, and has been read back. CRITICAL RESULTS CALLED   HCT 20.3 (L) 39.0 - 52.0 %   MCV 94.9 80.0 - 100.0 fL   MCH 31.3 26.0 - 34.0 pg   MCHC 33.0 30.0 - 36.0 g/dL   RDW 20.4 (H) 11.5 - 15.5 %   Platelets 31 (L) 150 - 400 K/uL    Comment: SPECIMEN CHECKED FOR CLOTS Immature Platelet Fraction may be clinically indicated, consider ordering this additional test [id number] CONSISTENT WITH PREVIOUS RESULT    nRBC 0.4 (H) 0.0 - 0.2 %    Comment: Performed at [facility], [place] [address], [place], [place] [address]  Comprehensive metabolic panel     Status: Abnormal   Collection Time: 12/20/21  2:23 AM  Result Value Ref Range   Sodium 134 (L) 135 - 145 mmol/L   Potassium 3.9 3.5 - 5.1 mmol/L   Chloride 103 98 - 111 mmol/L   CO2 21 (L) 22 - 32 mmol/L   Glucose, Bld 203 (H) 70 - 99 mg/dL    Comment: Glucose reference range applies only to samples taken after fasting for at least 8 hours.   BUN 63 (H) 8 - 23 mg/dL   Creatinine, Ser 1.89 (H)  0.61 - 1.24 mg/dL   Calcium 7.8 (L) 8.9 - 10.3 mg/dL   Total Protein 5.7 (L) 6.5 - 8.1 g/dL   Albumin 2.5 (L) 3.5 - 5.0 g/dL   AST 678 (H) 15 - 41 U/L   ALT 487 (H) 0 - 44 U/L   Alkaline Phosphatase 71 38 - 126 U/L   Total Bilirubin 3.9 (H) 0.3 - 1.2 mg/dL   GFR, Estimated 37 (L) >60 mL/min    Comment: (NOTE) Calculated using the CKD-EPI Creatinine Equation (2021)    Anion gap 10 5 - 15    Comment: Performed at [facility], [place] [address] [place], [place] [address]  Magnesium     Status: None   Collection Time: 12/20/21  2:23 AM  Result Value Ref Range   Magnesium 2.3 1.7 - 2.4 mg/dL    Comment: Performed at [facility], [place] [address] [place], [place] [address]  Prepare RBC (crossmatch)     Status: None   Collection Time: 12/20/21  2:56 AM  Result Value Ref Range   Order Confirmation      ORDER PROCESSED BY BLOOD BANK Performed at [facility], [place] [address], [place], [place] [address]   Hemoglobin and hematocrit, blood     Status: Abnormal   Collection Time: 12/20/21  8:01 AM  Result Value Ref Range   Hemoglobin 9.6 (L) 13.0 - 17.0 g/dL    Comment: REPEATED TO VERIFY POST TRANSFUSION SPECIMEN    HCT 29.0 (L) 39.0 - 52.0 %    Comment: Performed at [facility], [place] [address] [place], [place] [address]  CK     Status: Abnormal   Collection Time: 12/20/21  8:01 AM  Result Value Ref  Range   Total CK 4,231 (H) 49 - 397 U/L    Comment: RESULTS CONFIRMED BY MANUAL DILUTION Performed at [facility], [place] [address], [place], [place] [address]   Hemoglobin A1c     Status: None   Collection Time: 12/20/21  8:01 AM  Result Value Ref Range   Hgb A1c MFr Bld 4.9 4.8 - 5.6 %    Comment: (NOTE) Pre diabetes:          5.7%-6.4%  Diabetes:              >6.4%  Glycemic control for   <7.0% adults with diabetes    Mean Plasma Glucose 93.93 mg/dL    Comment: Performed at [place] [address] [address], [place], [place] [address]  MRSA Next Gen by PCR, Nasal     Status: Abnormal   Collection Time: 12/20/21  9:40 AM   Specimen: Nasal Mucosa; Nasal Swab  Result Value Ref Range   MRSA by PCR Next Gen DETECTED (A) NOT DETECTED    Comment: (NOTE) The GeneXpert MRSA Assay (FDA approved for NASAL specimens only), is one component of a comprehensive MRSA colonization surveillance program. It is not intended to diagnose MRSA infection nor to guide or monitor treatment for MRSA infections. Test performance is not FDA approved in patients less than 45 years old. Performed at [facility], [place] [address] [place], [place] [address]     CT ABDOMEN PELVIS WO CONTRAST  Result Date: 12/20/2021 CLINICAL DATA:  Acute abdominal pain EXAM: CT ABDOMEN AND PELVIS WITHOUT CONTRAST TECHNIQUE: Multidetector CT imaging of the abdomen and pelvis was performed following the standard protocol without IV contrast. RADIATION DOSE REDUCTION: This exam was performed according to the departmental dose-optimization program which includes automated exposure control, adjustment of the mA and/or kV according to patient size and/or use of iterative reconstruction technique. COMPARISON:  MRI abdomen dated 12/20/2021. Right upper quadrant ultrasound dated 12/18/2021. FINDINGS: Lower chest: Mild left basilar atelectasis. Hepatobiliary: Macronodular hepatic contour raises the possibility of cirrhosis. No focal hepatic lesion on unenhanced CT. Cholelithiasis with gallbladder distension and pericholecystic inflammatory changes, reflecting acute cholecystitis. Secondary inflammatory changes along the serosal aspect of the colon at the hepatic flexure (series 2/image 36). Localized perforation with fistulous communication is suggested on MR but is poorly evaluated on unenhanced CT (series 2/image 35). No intrahepatic or extrahepatic ductal dilatation. Pancreas: Within normal limits. Spleen:  Mildly enlarged, measuring 16.4 cm in maximal craniocaudal dimension. Adrenals/Urinary Tract: Adrenal glands are within normal limits. Kidneys are within normal limits. No renal, ureteral, or bladder calculi. No hydronephrosis. Bladder is mildly thick-walled although underdistended. Stomach/Bowel: Stomach is within normal limits. No evidence of bowel obstruction. Normal appendix (series 2/image 61). Serosal wall thickening/inflammatory changes along the hepatic flexure, as described above. Mild left colonic diverticulosis, without evidence of diverticulitis. Vascular/Lymphatic: No evidence of abdominal aortic aneurysm. Mild perigastric varices. Atherosclerotic calcifications of the abdominal aorta and branch vessels. No suspicious abdominopelvic lymphadenopathy. Reproductive: Prostate is unremarkable. Other: No abdominopelvic ascites. No free air. Musculoskeletal: Degenerative changes of the visualized thoracolumbar spine. Mild superior endplate changes at L2, likely chronic. IMPRESSION: Cholelithiasis with acute cholecystitis. Suspected localized perforation with secondary inflammatory changes involving the hepatic flexure of the colon, better evaluated on recent MRI. Surgical consultation is suggested. Secondary inflammatory changes along the serosal aspect of the colon at the hepatic flexure. Localized perforation with fistulous communication is suggested on MR but is poorly evaluated on unenhanced CT. Suspected  cirrhosis. No focal hepatic lesion on unenhanced CT. Mild splenomegaly and perigastric varices. These results will be called to the ordering clinician or representative by the Radiologist Assistant, and communication documented in the PACS or [employer]. Electronically Signed   By: Julian Hy M.D.   On: 12/20/2021 17:16   DG Abd 1 View  Result Date: 12/18/2021 CLINICAL DATA:  Enteric catheter placement EXAM: ABDOMEN - 1 VIEW COMPARISON:  None Available. FINDINGS: Frontal view of the lower  chest and upper abdomen demonstrates a weighted tip of an enteric feeding catheter projecting over the gastric antrum. Bowel gas pattern is unremarkable. IMPRESSION: 1. Enteric catheter tip projecting over the gastric antrum. Electronically Signed   By: Randa Ngo M.D.   On: 12/18/2021 19:25   CT HEAD WO CONTRAST (5MM)  Result Date: 12/18/2021 CLINICAL DATA:  Altered mental status EXAM: CT HEAD WITHOUT CONTRAST TECHNIQUE: Contiguous axial images were obtained from the base of the skull through the vertex without intravenous contrast. RADIATION DOSE REDUCTION: This exam was performed according to the departmental dose-optimization program which includes automated exposure control, adjustment of the mA and/or kV according to patient size and/or use of iterative reconstruction technique. COMPARISON:  12/21/2019 FINDINGS: Brain: No evidence of acute infarction, hemorrhage, cerebral edema, mass, mass effect, or midline shift. No hydrocephalus or extra-axial fluid collection. Vascular: No hyperdense vessel. Skull: Normal. Negative for fracture or focal lesion. Sinuses/Orbits: No acute finding. Other: The mastoid air cells are well aerated. IMPRESSION: No acute intracranial process. Electronically Signed   By: Merilyn Baba M.D.   On: 12/18/2021 21:43   MR LIVER WO CONRTAST  Result Date: 12/20/2021 CLINICAL DATA:  Evaluate for hepatocellular carcinoma. EXAM: MRI ABDOMEN WITHOUT CONTRAST TECHNIQUE: Multiplanar multisequence MR imaging was performed without the administration of intravenous contrast. COMPARISON:  Abdominal sonogram 12/18/2021 FINDINGS: Lower chest: Atelectasis versus airspace disease noted within the left base. Hepatobiliary: Diffuse low signal throughout the liver on the in-phase sequence which increases in signal intensity on the out of phase sequences suggest iron deposition within the liver. There is hypertrophy of the lateral segment of left hepatic lobe as well as the caudate lobe. Contour  the liver has a macro nodular appearance compatible with cirrhosis. Evaluation for underlying mass lesion is significantly limited due to lack of IV contrast material and respiratory motion artifact. Furthermore, patient was unable to breath hold and requested early termination of the exam. Within this limitation no large liver mass identified. Multiple stones are identified within the gallbladder. These all measure around 4 mm. The gallbladder wall appears diffusely edematous with surrounding inflammatory fat stranding. There is a focal fluid collection which extends beyond the normal margins of the anterior wall of the gallbladder abutting the hepatic flexure, image 25/4 and image 14/6. As mentioned above examination is significantly limited reflecting motion artifact and lack of IV contrast material. No signs of common bile duct or intrahepatic bile duct dilatation. Pancreas:  No acute abnormality. Spleen:  The spleen is enlarged measuring 16.9 cm cranial caudal. Adrenals/Urinary Tract: Normal appearance of the adrenal glands. No kidney mass or signs of hydronephrosis. Stomach/Bowel: No abnormal bowel dilatation identified. Vascular/Lymphatic: No pathologically enlarged lymph nodes identified. No abdominal aortic aneurysm demonstrated. Other: There is free fluid identified within the upper abdomen extending over the liver dome. Musculoskeletal: Asymmetric increased signal intensity is identified localizing to the visualized portions of the left gluteus musculature, image 46/4. IMPRESSION: 1. Examination is limited due to lack of IV contrast material and respiratory motion artifact.  Within this limitation no large liver mass identified. However study is not diagnostic for excluding 8 cc in a patient who is at increased risk. 2. Morphologic features of the liver compatible with cirrhosis with stigmata of portal venous hypertension including splenomegaly and ascites. 3. Gallstones with diffuse gallbladder wall  thickening and surrounding inflammatory fat stranding. A small volume of free fluid is identified extending over the dome of liver. There is a focal fluid collection which extends beyond the normal margins of the anterior wall of the gallbladder abutting the hepatic flexure. This may represent a small abscess or focal perforation of the gallbladder. Cholecystocolonic fistula is also a potential consideration. Further evaluation with CT of the abdomen pelvis is recommended. 4. Asymmetric increased signal intensity identified localizing to the visualized portions of the left gluteus musculature. This is only partially visualized and is of uncertain significance. Attention on follow-up CT is advised. 5. Critical Value/emergent results were called by telephone at the time of interpretation on 12/20/2021 at 11:45 am to provider [facility] , who verbally acknowledged these results. Electronically Signed   By: Kerby Moors M.D.   On: 12/20/2021 11:46   ECHOCARDIOGRAM COMPLETE  Result Date: 12/19/2021    ECHOCARDIOGRAM REPORT   Patient Name:   Derek Lindon Nielsen [name]. Date of Exam: 12/19/2021 Medical Rec #:  [id number]            Height:       72.0 in Accession #:    [id number]           Weight:       196.2 lb Date of Birth:  1951/07/28            BSA:          2.113 m Patient Age:    1 years             BP:           137/76 mmHg Patient Gender: M                    HR:           89 bpm. Exam Location:  Inpatient Procedure: 2D Echo, Cardiac Doppler and Color Doppler Indications:    Elevated troponin  History:        Patient has prior history of Echocardiogram examinations, most                 recent 04/09/2018. Risk Factors:Hypertension.  Sonographer:    Jefferey Pica Referring Phys: [id number] [place]  1. Left ventricular ejection fraction, by estimation, is 55 to 60%. The left ventricle has normal function. The left ventricle demonstrates regional wall motion abnormalities (basal septal hypokinesis).  There is moderate concentric left ventricular hypertrophy. Left ventricular diastolic parameters are consistent with Grade I diastolic dysfunction (impaired relaxation). Qualitatively LV appears slightly underfilled.  2. Right ventricular systolic function is normal. The right ventricular size is normal. Tricuspid regurgitation signal is inadequate for assessing PA pressure.  3. The mitral valve is normal in structure. No evidence of mitral valve regurgitation. No evidence of mitral stenosis.  4. The aortic valve was not well visualized. There is mild calcification of the aortic valve. Aortic valve regurgitation is not visualized.  5. The inferior vena cava is normal in size with greater than 50% respiratory variability, suggesting right atrial pressure of 3 mmHg. Comparison(s): Prior images reviewed side by side. Wall motion abnormalities are new from prior: prior study was not  performed off-axis. LV size was normal in prior study. FINDINGS  Left Ventricle: Left ventricular ejection fraction, by estimation, is 55 to 60%. The left ventricle has normal function. The left ventricle demonstrates regional wall motion abnormalities. The left ventricular internal cavity size was normal in size. There is moderate concentric left ventricular hypertrophy. Left ventricular diastolic parameters are consistent with Grade I diastolic dysfunction (impaired relaxation).  LV Wall Scoring: The basal anteroseptal segment, mid inferoseptal segment, and basal inferoseptal segment are hypokinetic. Right Ventricle: The right ventricular size is normal. No increase in right ventricular wall thickness. Right ventricular systolic function is normal. Tricuspid regurgitation signal is inadequate for assessing PA pressure. Left Atrium: Left atrial size was normal in size. Right Atrium: Right atrial size was normal in size. Pericardium: Trivial pericardial effusion is present. Presence of epicardial fat layer. Mitral Valve: The mitral valve is  normal in structure. No evidence of mitral valve regurgitation. No evidence of mitral valve stenosis. Tricuspid Valve: The tricuspid valve is normal in structure. Tricuspid valve regurgitation is not demonstrated. No evidence of tricuspid stenosis. Aortic Valve: The aortic valve was not well visualized. There is mild calcification of the aortic valve. Aortic valve regurgitation is not visualized. Aortic valve peak gradient measures 8.5 mmHg. Pulmonic Valve: The pulmonic valve was not well visualized. Pulmonic valve regurgitation is not visualized. Aorta: The aortic root and ascending aorta are structurally normal, with no evidence of dilitation. Venous: The inferior vena cava is normal in size with greater than 50% respiratory variability, suggesting right atrial pressure of 3 mmHg. IAS/Shunts: No atrial level shunt detected by color flow Doppler.  LEFT VENTRICLE PLAX 2D LVIDd:         5.10 cm LVIDs:         3.30 cm LV PW:         1.50 cm LV IVS:        1.50 cm LVOT diam:     2.10 cm LV SV:         51 LV SV Index:   24 LVOT Area:     3.46 cm  IVC IVC diam: 1.80 cm LEFT ATRIUM           Index        RIGHT ATRIUM           Index LA diam:      3.30 cm 1.56 cm/m   RA Area:     16.60 cm LA Vol (A4C): 57.0 ml 26.97 ml/m  RA Volume:   48.90 ml  23.14 ml/m  AORTIC VALVE                 PULMONIC VALVE AV Area (Vmax): 2.28 cm     PV Vmax:       1.06 m/s AV Vmax:        146.00 cm/s  PV Peak grad:  4.5 mmHg AV Peak Grad:   8.5 mmHg LVOT Vmax:      96.00 cm/s LVOT Vmean:     55.600 cm/s LVOT VTI:       0.146 m  AORTA Ao Root diam: 3.70 cm Ao Asc diam:  3.40 cm MITRAL VALVE MV Area (PHT): 3.63 cm    SHUNTS MV Decel Time: 209 msec    Systemic VTI:  0.15 m MV E velocity: 44.60 cm/s  Systemic Diam: 2.10 cm MV A velocity: 56.60 cm/s MV E/A ratio:  0.79 Rudean Haskell MD Electronically signed by Rudean Haskell MD Signature Date/Time: 12/19/2021/8:46:06 AM  Final    T bili: 3.9 Alb: 2.5 Cr 1.9 Plts: 31 Alk  Phos: WNL  A/P: Derek Anon Kim [name]. is an 71 y.o. male with liver failure.  Ct and MRI concerning for cholecystitis.  Pt is not a candidate for surgery due to his liver disease and appears to be improving with current management.  Cont IV abx for now.  Will discuss with IR whether cholecystostomy tube would be feasible or warranted.      Rosario Adie, MD  Colorectal and General Surgery [facility]  Total time of evaluation, examination, counseling and implementing medical decisions was 55 mins.

## 2021-12-20 NOTE — Assessment & Plan Note (Addendum)
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 LDH markedly elevated.  Haptoglobin low.  Had a splenomegaly and splenic infarction.  Had frank red blood in his stool on 5/26 but no further bleed after FFP and platelet transfusion. EGD 5/22 with gastritis and duodenitis.  Colo in 5/22 with polyps, diverticulosis and internal hemorrhoid.  -Transfused a total of 9 units PRBC, 1 apheresis of platelet and 1 FFP so far. [facility] signed off. -Monitor H&H and transfuse for Hgb <7.0.  -Now hemoglobin/hematocrt is on the lower side and dropped him to 6.0/18.7 and he received 2 units of PRBCs and after the 2 units hemoglobin/hematocrit has improved to 7.5/23.3 -Repeat CBC in the AM

## 2021-12-20 NOTE — Assessment & Plan Note (Addendum)
-  RUQ [place] concerning for 6 cm liver mass.  Not appreciated on CT abdomen with contrast. -Follow up in the outpatient with GI and General Surgery

## 2021-12-20 NOTE — Assessment & Plan Note (Addendum)
Recent Labs    12/24/21 0309 12/25/21 0317 12/26/21 0310 12/27/21 0411 12/28/21 0401 12/29/21 0357 12/30/21 0433 12/31/21 0655 01/01/22 0542 01/02/22 0419  BUN '15 17 16 16 14 20 '$ 30* 35* 38* 40*  CREATININE 0.65 0.56* 0.41* 0.46* 0.48* 0.55* 0.63 0.57* 0.61 0.57*  -Likely due to hypotension and rhabdomyolysis.  Resolved and improved. -Recheck renal function in the morning; renal function is much improved at 30/0.63 -BUN is elevated and it went to 40 and will need to continue monitor Solu-Medrol and steroids but will need to still evaluate for upper GI bleed if he shows symptoms -Avoid nephrotoxic medications, contrast dyes, hypotension and dehydration to ensure adequate renal perfusion and renally adjust medications -Repeat CMP in the a.m.

## 2021-12-20 NOTE — Progress Notes (Signed)
SLP Cancellation Note  Patient Details Name: Jacquees Gongora Vialpando [name]. MRN: [id number] DOB: 02/03/1951   Cancelled treatment:       Reason Eval/Treat Not Completed: Other (comment) (pt undergonig CT abdomen requiring contrast, will follow up on 12/21/2021)  RN reported to SLP via secure chat that pt was tolerating po intake well earlier today.   Kathleen Lime, MS [facility] SLP Acute Rehab Services Office [phone] Pager [phone]  Macario Golds 12/20/2021, 5:18 PM

## 2021-12-20 NOTE — Plan of Care (Signed)
MRI and CT abdomen raises concern for calculus cholecystitis and possible cholecystocolonic fistula.  Added IV Flagyl.  General surgery consulted.

## 2021-12-20 NOTE — Progress Notes (Signed)
[facility] Progress Note Patient Name: [name]. DOB: November 17, 1950 MRN: [id number]   Date of Service  12/20/2021  HPI/Events of Note  Patient with acute on chronic hemolytic anemia Hg 7.2>6.7 Plt 31 No signs of bleeding  eICU Interventions  Transfuse 1 U PRBC     Intervention Category Intermediate Interventions: Bleeding - evaluation and treatment with blood products  Illya Gienger Rodman Pickle 12/20/2021, 2:53 AM

## 2021-12-20 NOTE — Assessment & Plan Note (Deleted)
See liver cirrhosis

## 2021-12-20 NOTE — Assessment & Plan Note (Addendum)
Elevated liver enzymes/hyperbilirubinemia; As Above Recent Labs  Lab 12/29/21 0357 12/30/21 0433 12/31/21 0655 01/01/22 0542 01/02/22 0419  AST 76* 71* 60* 71* 57*  ALT 46* 41 41 46* 50*  ALKPHOS 51 54 77 106 101  BILITOT 1.6* 1.9* 1.0 1.0 1.0  PROT 4.6* 4.8* 5.0* 4.9* 5.4*  ALBUMIN 1.7* 1.7* 1.7* 1.8* 2.0*  LFT pattern consistent rhabdo.  Rhabdo improving.  He has mild coagulopathy as well. -I have initiated Lasix given his volume overload -Continue monitoring LFT and CK

## 2021-12-20 NOTE — Progress Notes (Addendum)
Initial Nutrition Assessment  DOCUMENTATION CODES:   Not applicable  INTERVENTION:  - will order Boost Breeze BID, each supplement provides 250 kcal and 9 grams of protein. - will order Ensure Plus High Protein once/day, each supplement provides 350 kcal and 20 grams of protein. - will order 30 ml Prosource Plus once/day, each supplement provides 100 kcal and 15 grams protein.  - will order 1 tablet multivitamin with minerals/day.   NUTRITION DIAGNOSIS:   Increased nutrient needs related to acute illness as evidenced by estimated needs.  GOAL:   Patient will meet greater than or equal to 90% of their needs  MONITOR:   PO intake, Supplement acceptance, Labs, Weight trends  REASON FOR ASSESSMENT:   Malnutrition Screening Tool  ASSESSMENT:   71 y.o. 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 He presented to the ED via EMS from home after his home health aide found him on the ground after he slid out of bed. He has history of variceal changes on [place], denies blood in stool or dark stools. He was admitted for shock thought to be acute on chronic anemia and possible sepsis.  Patient laying in bed with his son, [name], at bedside. Patient lives alone and has a home health aide who provides bathing and dressing services, sometimes aids with meal preparation and acquiring food.   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 He had tried a prosthetic in the past but this did not work for him. For the past year he has mainly been using a motorized scooter, from what RD can decipher (patient is difficult to understand at times).   Patient confirms decreased appetite for at least the past few weeks. Breakfast tray on bedside table. Patient had eaten a few  bites but reports that tray was delivered while he was at MRI. He declined offer for lunch to be ordered.   He reports that since he has been able to have foods and beverages he has been drinking Gatorade. Patient assessed by SLP yesterday and recommendation was for current diet (Dysphagia 3, thin liquids).   He denies any difficulty with chewing or swallowing, denies pain with chewing or swallowing, and denies any gum bleeding when brushing his teeth.   Weight today is documented as 200 lb and on 08/23/21 was 219 lb. This indicates 19 lb weight loss (8.7% body weight) in the past 4 months.  High risk for malnutrition but unable to confirm dx at this time.  Patient and son share that they are looking forward to talking with TOC to determine options for facilities after d/c so patient can have a higher level of care assistance.   After RD left room, patient's son stepped into the hallway to inform RD that another family member had been taking food to patient's home but patient was not eating it because he was confused and had been unable to find the food.     Labs reviewed; Na: 134 mmol/l, BUN: 63 mg/dl, creatinine: 1.89 mg/dl, Ca: 7.8 mg/dl, LFTs elevated (down from 5/23), GFR: 37 ml/min.  Medications reviewed; 20 mg lactulose BID, 50 mcg oral synthroid/day, 40 mg oral protonix/day.     NUTRITION - FOCUSED PHYSICAL EXAM:  Flowsheet Row Most Recent Value  Orbital Region No depletion  Upper  Arm Region No depletion  Thoracic and Lumbar Region Unable to assess  Buccal Region No depletion  Temple Region No depletion  Clavicle Bone Region No depletion  Clavicle and Acromion Bone Region No depletion  Scapular Bone Region No depletion  Dorsal Hand No depletion  Patellar Region No depletion  [LLE only d/t R AKA]  Anterior Thigh Region No depletion  [LLE only d/t R AKA]  Posterior Calf Region No depletion  [LLE only d/t R AKA]  Edema (RD Assessment) Mild  [LLE]  Hair Reviewed  Eyes  Reviewed  Mouth Reviewed  [poor dentition]  Skin Reviewed  Nails Reviewed       Diet Order:   Diet Order             DIET DYS 3 Room service appropriate? Yes; Fluid consistency: Thin  Diet effective now                   EDUCATION NEEDS:   Not appropriate for education at this time  Skin:  Skin Assessment: Skin Integrity Issues: Skin Integrity Issues:: DTI DTI: L foot  Last BM:  5/24 (type 6 x1, medium amount)  Height:   Ht Readings from Last 1 Encounters:  12/18/21 6' (1.829 m)    Weight:   Wt Readings from Last 1 Encounters:  12/20/21 90.7 kg     BMI:  Body mass index is 27.12 kg/m.  Estimated Nutritional Needs:  Kcal:  1800-2000 kcal Protein:  90-105 grams Fluid:  >/= 2 L/day     Jarome Matin, MS, RD, LDN Registered Dietitian II Inpatient Clinical Nutrition RD pager # and on-call/weekend pager # available in [facility]

## 2021-12-20 NOTE — Assessment & Plan Note (Addendum)
-  Patient was Tachypneic, hypotensive with AKI, lactic acidosis and thrombocytopenia, and required vasopressors on admission.   -Imaging raises concern for calculus cholecystitis, possible cholecystocolonic fistula left thigh muscle necrosis versus neck fasciitis.   -Blood and urine cultures NGTD.  Biliary culture with pansensitive Enterococcus faecalis and faecium as well as lactobacillus species.   -Hemodynamically stable.  No leukocytosis and mildly leukopenic  -High risk for surgical intervention -Percutaneous cholecystostomy tube by IR on 5/27 -CTX and Flagyl >>>Vanc, Cefepime and Flagyl 5/27>>> IV Unasyn 5/30 -> 12/29/21 -Patient spiked a temperature overnight and has diffuse maculopapular erythematous rash and likely a drug rash so his Unasyn is now been discontinued -ID has been consulted and appreciate further evaluation and recommending to evaluate for SBP and feel that the rash and the fever could be related to a drug rash so they recommended stopping Unasyn and recommended continuing fluconazole for Candida in his drain culture for 5 days -We will need to watch his rash carefully and ID feels it can get worse as we can but if it remains stable or improved next week can be discharged early -General surgery sent referral to [facility] for outpatient follow-up -Discharge plan in place from IR standpoint -Blood cultures x2 reordered and showing NGTD at 4 Days  -Tolerating soft diet and likley to D/C to SNF once rash improves and once we know that he is not having any more fevers; A Febrile the last 72 hours

## 2021-12-20 NOTE — Hospital Course (Addendum)
The patient is a 71 year old M with PMH of EtOH cirrhosis, nonimmune hemolytic anemia, pancytopenia, right AKA and hepatitis C brought to ED with altered mental status after he slid off his bed and found down on the ground by family, and admitted to ICU with hypovolemic shock, hemolytic anemia, liver failure, encephalopathy, AKI and hyperkalemia.  In ED, hypotensive and altered.  Hgb 4.0.  Lactic acid> 9.  AST 495.  ALT 107.  Bili 8.  He was started on Levophed, IV antibiotics and blood transfusion. Hematology consulted. Patient came off Levophed and transferred to [facility] hospitalist service on 5/24.    RUQ [place] concerning for liver cirrhosis and liver mass. 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  Liver cirrhosis, portal HTN, splenomegaly, ascites. General surgery consulted and recommended conservative care with IV antibiotics.   5/26-patient was on IV fluid for AKI and rhabdomyolysis but developed significant scrotal edema.  AKI resolved.  IV fluid discontinued.  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  He also had bloody stool later in the day.  Patient was moved to ICU.  PCCM, general surgery and IR consulted.  Antibiotics broadened.  527-transfused a unit of FFP and 2 units of PRBC.  Had percutaneous cholecystostomy tube.  Orthopedic surgery and GI consulted, and did not feel surgical intervention is indicated.  528-Transferred to progressive care.  Started on p.o. Seroquel for delirium.  De-escalated antibiotics to IV Unasyn based on biliary culture.  No further GI bleed but blood  counts dropped likely due to hemolytic anemia and he was transfused another 1 unit PRBCs on 12/26/2021 early in the AM.    12/27/21 Hemoglobin/hematocrit seems to have stabilized now.  He is still complaining of some leg pain so will be discussed with Ortho.  General surgery has now signed off the case and they are making a referral to [facility] for surgeon/hepatologist there to help address all of his liver issues.  IR evaluated and recommending continuing the [place] catheter with 3 times daily flushes with 5 cc of normal saline acute guarding output every shift and following up in 6 to 8 weeks for replacement or for repeat imaging/possible drain injection.  5/31-6/1: Vascular surgery evaluated and he underwent a CTA of the abdomen and pelvis with bilateral runoff and Dr. Trula Slade reviewed with the radiologist and they feel his vascular disease likely result of a high-grade right common femoral artery stenosis and because of his multiple comorbidities including cirrhosis and thrombocytopenia he is not an operative candidate and he feels that they would not stent his common femoral artery given the comorbidities.  Orthopedic surgery evaluated his new CT angio and they feel that he still not a surgical candidate at this time given that he is clinically stable and slightly improving.  They feel that if his symptoms progress into florid infection and he requires surgery would be a radical series of debridements of the left thigh or left hip disarticulation which would be extremely morbid and associated with a very high perioperative mortality.  Palliative care was consulted for further pain assistance and management and patient is improving but overnight from 6/1 till 6/2 he  spiked a temperature of 101.  He also developed a diffuse body rash and questionable drug reaction.  Given his temperature and rash ID was consulted and he was otherwise hemodynamically stable.  Patient no localizing infectious syndrome currently  and he was started on fluconazole for Candida in his bile.  There is no sign of pneumonia or GU infection and he had this diffuse maculopapular erythematous rash on the trunk and arms which appear to be likely drug rash so ID discontinued his Unasyn and recommending continuing fluconazole for Candida in the drain until 01/03/2022.  ID recommended that the rash may get worse but should stabilize early next week and recommended considering anemia SBP given his cirrhosis status so we will obtain a ultrasound of the abdomen.  Blood cultures x2 were obtained and these will need to be followed but patient will follow-up with general surgeon outpatient setting.  12/30/2021 patient started itching so given some Benadryl and his rash is a little worse so we will start him on IV Solu-Medrol 60 mg every 12.  He is also appeared volume overloaded so we will give him IV diuresis with IV Lasix with milligrams twice daily.  Patient was noted to be 7 L positive since admission in the setting of his cirrhosis.  We will continue diuresis for few days now and monitor his rash.  12/31/2021.  Remains volume overloaded and blood count dropped likely in the setting of his hemolytic anemia slightly Worsening.  She is 1 unit PRBCs.  We will continue with steroids given his rash but this is improving.  IV diuresis increased to 60 mg twice daily.  Given that he had a slight wet sounding chest we will give him Xopenex and Atrovent as well as a flutter valve and guaifenesin will also check ammonia level given that he is slightly sleepy.  01/01/22: His blood count remains on the lower side so we will transfuse another 1 unit of PRBCs.  Given his volume overload still will increase the diuretics to IV 80 twice daily.  Will discuss with palliative care about reinitiation of his fentanyl patch.  He has now signed off the case given that the rash is not consistent with infection and may be a drug rash.  Antibiotics have not been discontinued and  fluconazole will be stopped on 01/03/2022.  Ultrasound showed no ascites and he has no signs of SBP.  He is better sounding today from a respiratory standpoint  01/02/2022: CK still about the same but his rash is significant improved so we will reduce the Solu-Medrol dose to 40 mg daily and stop the Benadryl given that his itch is improved.  He continues diuresed very well and he is fine negative now.  Feels about the same as yesterday and having some mild abdominal discomfort.  No other concerns

## 2021-12-20 NOTE — Assessment & Plan Note (Signed)
RUQ ultrasound raises concern for liver mass.  He has alcoholic liver cirrhosis and history of hep C.  He is at risk for [facility].  Unfortunately, not able to use contrast.  Noncontrast MRI nondiagnostic. -Follow CT abdomen and pelvis without contrast -Follow AFP -Will obtain CT or MRI when AKI improves.

## 2021-12-20 NOTE — Progress Notes (Signed)
Patient's family member, son [name], came in this AM requesting a referral to a social worker to discuss further assisted home living. Order has already been placed. Informed patient's son of this.

## 2021-12-20 NOTE — Progress Notes (Signed)
This nurse entered the room to continue with the patient's prescribed oral solution before his scheduled CT scan. The patient's cup half full and the patient's son informed this nurse that he was almost finished after he refilled it. After further investigation, the patient's son had filled the patient's drinking cup with sterile water thinking it was the solution the patient was supposed to drink. This nurse immediately took the cup away, assessed the patient and educated the family on not administering any medication to the patient while in the hospital.

## 2021-12-20 NOTE — Evaluation (Addendum)
Physical Therapy Evaluation Patient Details Name: [name] [name]. MRN: [id number] DOB: Dec 17, 1950 Today's Date: 12/20/2021  History of Present Illness  71 year-old M with PMH of EtOH cirrhosis, nonimmune hemolytic anemia, pancytopenia, right AKA and hepatitis C brought to ED 12/18/21 with altered mental status after he slid off his bed and found down on the ground.. Patient  admitted to ICU with hypovolemic shock, hemolytic anemia, liver failure, encephalopathy, AKI and hyperkalemia.  Clinical Impression  Patient dozing, was irritable   about PT wanting to work with him. Patient stating that he has to drink the contrast, asking for Gatorade. Patient  did participate in rolling  in bed for bed to be changed. Declined  to work on  sitting up in bed or edge of bed. Will continue PT for mobility.  At baseline, patient was mod I in transfers. Unsure amount of support for meals and transportation. Patient has a son  who has arrived from Out of town.  Pt admitted with above diagnosis.  Pt currently with functional limitations due to the deficits listed below (see PT Problem List). Pt will benefit from skilled PT to increase their independence and safety with mobility to allow discharge to the venue listed below.         Recommendations for follow up therapy are one component of a multi-disciplinary discharge planning process, led by the attending physician.  Recommendations may be updated based on patient status, additional functional criteria and insurance authorization.  Follow Up Recommendations Skilled nursing-short term rehab (<3 hours/day)    Assistance Recommended at Discharge Frequent or constant Supervision/Assistance  Patient can return home with the following  Two people to help with walking and/or transfers;Assist for transportation;Help with stairs or ramp for entrance;A little help with bathing/dressing/bathroom    Equipment Recommendations None recommended by PT  Recommendations  for Other Services       Functional Status Assessment Patient has had a recent decline in their functional status and demonstrates the ability to make significant improvements in function in a reasonable and predictable amount of time.     Precautions / Restrictions Precautions Precautions: Fall Precaution Comments: R AKA      Mobility  Bed Mobility Overal bed mobility: Needs Assistance Bed Mobility: Rolling Rolling: Mod assist, Max assist, +2 for physical assistance, +2 for safety/equipment         General bed mobility comments: multimodal cues to participate, reach for rails to assist rolling.    Transfers                   General transfer comment: NT, having CT later today    Ambulation/Gait               General Gait Details: non ambulatory PTA  Stairs            Wheelchair Mobility    Modified Rankin (Stroke Patients Only)       Balance                                             Pertinent Vitals/Pain Pain Assessment Pain Assessment: Faces Faces Pain Scale: Hurts little more Pain Location: both legs Pain Descriptors / Indicators: Discomfort, Aching Pain Intervention(s): Monitored during session, Repositioned    Home Living Family/patient expects to be discharged to:: Private residence Living Arrangements: Alone Available Help at Discharge: Personal care attendant (  3 hrs/day x 5 days) Type of Home: Apartment Home Access: Level entry       Home Layout: One level Home Equipment: Wheelchair - Press photographer      Prior Function Prior Level of Function : Needs assist             Mobility Comments: Mod I for  transfers only,       Hand Dominance        Extremity/Trunk Assessment        Lower Extremity Assessment Lower Extremity Assessment: RLE deficits/detail;LLE deficits/detail RLE Deficits / Details: AKA LLE Deficits / Details: lacks 30 degrees knee ext, able to lift leg from bed  and flex hip and knee in supine       Communication      Cognition Arousal/Alertness: Initially sleeping, aroused easily.Awake/alert Behavior During Therapy: Unclear, initially stating that he has 2 bottles to drink , that he was going down for PT , he is going for CT.Became more agitated with further orientation questions so ceased questions. Overall Cognitive Status: Within Functional Limits for tasks assessed                                 General Comments: somewhat irritable about having to drink contrast. not wanting  to have therapy        General Comments General comments (skin integrity, edema, etc.): NT    Exercises     Assessment/Plan    PT Assessment Patient needs continued PT services  PT Problem List Decreased strength;Decreased balance;Pain;Decreased mobility;Decreased activity tolerance;Decreased safety awareness       PT Treatment Interventions DME instruction;Therapeutic activities;Therapeutic exercise;Patient/family education;Functional mobility training;Wheelchair mobility training    PT Goals (Current goals can be found in the Care Plan section)  Acute Rehab PT Goals Patient Stated Goal: to go to a facility to get more help PT Goal Formulation: With patient Time For Goal Achievement: 01/03/22 Potential to Achieve Goals: Fair    Frequency Min 2X/week     Co-evaluation               AM-PAC PT "6 Clicks" Mobility  Outcome Measure Help needed turning from your back to your side while in a flat bed without using bedrails?: A Lot Help needed moving from lying on your back to sitting on the side of a flat bed without using bedrails?: Total Help needed moving to and from a bed to a chair (including a wheelchair)?: Total Help needed standing up from a chair using your arms (e.g., wheelchair or bedside chair)?: Total Help needed to walk in hospital room?: Total Help needed climbing 3-5 steps with a railing? : Total 6 Click Score: 7     End of Session   Activity Tolerance: Treatment limited secondary to agitation Patient left: in bed;with nursing/sitter in room Nurse Communication: Mobility status PT Visit Diagnosis: Muscle weakness (generalized) (M62.81);History of falling (Z91.81)    Time: 3382-5053 PT Time Calculation (min) (ACUTE ONLY): 25 min   Charges:   PT Evaluation $PT Eval Low Complexity: 1 Low PT Treatments $Therapeutic Activity: 8-22 mins        Tresa Endo PT Acute Rehabilitation Services Pager [phone] Office [phone]   Claretha Cooper 12/20/2021, 2:53 PM

## 2021-12-20 NOTE — Assessment & Plan Note (Deleted)
Likely traumatic from recent fall.  CT showed muscle swelling/tracking gas in left medial thigh.  CK 4231>> 7835> 10,700>> 5600 -Stopped IV fluids due to significant scrotal edema -Continue monitoring

## 2021-12-20 NOTE — TOC Initial Note (Signed)
Transition of Care (TOC) - Initial/Assessment Note   Patient Details  Name: [name]. MRN: [id number] Date of Birth: 03/22/1951  Transition of Care [facility]) CM/SW Contact:    Sherie Don, LCSW Phone Number: 12/20/2021, 3:59 PM  Clinical Narrative: PT evaluation recommended SNF. CSW met with patient and son, although patient was lethargic throughout discussion. Son agreeable to SNF, but is also looking for LTC after rehab. CSW explained TOC can fax out patient for SNF, but son would need to call facilities to find a LTC Medicaid bed and this may affect rehab bed offers.  FL2 started; PASRR verified. TOC to follow.  Expected Discharge Plan: Skilled Nursing Facility Barriers to Discharge: Continued Medical Work up  Patient Goals and CMS Choice Patient states their goals for this hospitalization and ongoing recovery are:: Go to rehab and then transition to LTC CMS Medicare.gov Compare Post Acute Care list provided to:: Patient Represenative (must comment) Choice offered to / list presented to : Adult Children  Expected Discharge Plan and Services Expected Discharge Plan: [place] In-house Referral: Clinical Social Work Post Acute Care Choice: [facility] Living arrangements for the past 2 months: Apartment            DME Arranged: N/A DME Agency: NA  Prior Living Arrangements/Services Living arrangements for the past 2 months: Apartment Lives with:: Self Patient language and need for interpreter reviewed:: Yes Do you feel safe going back to the place where you live?: No   Patient cannot care for himself at home.  Need for Family Participation in Patient Care: Yes (Comment) Care giver support system in place?: Yes (comment) Criminal Activity/Legal Involvement Pertinent to Current Situation/Hospitalization: No - Comment as needed  Activities of Daily Living Home Assistive Devices/Equipment: Other (Comment) (pt unable to name all the equipment he  uses at home) ADL Screening (condition at time of admission) Patient's cognitive ability adequate to safely complete daily activities?: No Is the patient deaf or have difficulty hearing?: No Does the patient have difficulty seeing, even when wearing glasses/contacts?: No Does the patient have difficulty concentrating, remembering, or making decisions?: Yes (slow to answer) Patient able to express need for assistance with ADLs?: Yes (slowly) Does the patient have difficulty dressing or bathing?: Yes Independently performs ADLs?: No Communication: Independent Dressing (OT): Needs assistance Is this a change from baseline?: Change from baseline, expected to last >3 days Grooming: Needs assistance Is this a change from baseline?: Change from baseline, expected to last >3 days Feeding: Needs assistance Is this a change from baseline?: Change from baseline, expected to last >3 days Bathing: Needs assistance Is this a change from baseline?: Change from baseline, expected to last >3 days Toileting: Needs assistance Is this a change from baseline?: Change from baseline, expected to last >3days In/Out Bed: Needs assistance Is this a change from baseline?: Change from baseline, expected to last >3 days Walks in Home: Needs assistance Is this a change from baseline?: Change from baseline, expected to last >3 days Does the patient have difficulty walking or climbing stairs?: Yes Weakness of Legs: Both Weakness of Arms/Hands: Both  Permission Sought/Granted Permission sought to share information with : Facility Art therapist granted to share information with : Yes, Verbal Permission Granted Permission granted to share info w AGENCY: SNFs  Emotional Assessment Appearance:: Appears stated age, Appears older than stated age Attitude/Demeanor/Rapport: Lethargic Orientation: : Oriented to Self, Oriented to Place, Oriented to Situation Alcohol / Substance Use: Not  Applicable  Admission diagnosis:  Shock [facility]) [R57.9] Patient Active Problem List   Diagnosis Date Noted   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 97/95/3692   Liver mass 12/20/2021   Thrombocytopenia ([place]) 12/20/2021   AKI (acute kidney injury) ([place]) 23/00/9794   Acute metabolic encephalopathy 99/71/8209   Elevated liver enzymes 12/20/2021   Hyperbilirubinemia 12/20/2021   Abnormal MRI, liver 12/20/2021   Rhabdomyolysis 12/20/2021   Hyperkalemia and hyponatremia 12/20/2021   Hyperkalemia 12/20/2021   Hyponatremia 12/20/2021   Hypovolemic shock ([place]) 12/18/2021   Hypomagnesemia 12/16/2020   Hemolytic anemia ([facility])    Gastric nodule    Duodenal erythema    Polyp of colon    Left leg swelling 12/14/2020   Eye trauma 02/29/2020   History of pancytopenia 02/29/2020   Portal venous hypertension ([place]) 02/29/2020   S/P AKA (above knee amputation) unilateral ([place]) 02/29/2020   Hepatic cirrhosis due to chronic hepatitis C infection ([place]) 02/29/2020   Pharmacologic therapy 02/29/2020   Disorder of skeletal system 02/29/2020   Physical deconditioning 02/29/2020   HTN (hypertension) 09/14/2019   Hepatic encephalopathy ([place]) 04/07/2018   Osteoarthritis of left knee 02/26/2018   Chronic pain syndrome 11/23/2016   Hypothyroidism 09/25/2016   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   Hepatitis B core antibody positive 05/22/2016   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[place]) 04/14/2015   Cellulitis of right thigh 05/21/2012   Pancytopenia ([place]) 02/26/2012   Encounter for long-term (current) use of other medications 02/26/2012   Screening for prostate cancer 02/26/2012   Anemia 02/26/2012   Closed fracture of carpal bone 10/12/2009   Dyslipidemia 09/12/2009   Phantom limb syndrome ([place]) 09/12/2009   Hypocalcemia 12/23/2007   ERECTILE DYSFUNCTION, 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

## 2021-12-20 NOTE — Progress Notes (Signed)
VAST consult placed for secondary PIV. Patient currently has 20G RAC. Spoke to bedside RN Clarise Cruz and she stated he may need a second PIV for blood administration. Spoke to patient and he refused for PIV placement at this time due to numerous sticks during the day. Current hgb/hct is stable at 9.6/29 and she stated we can hold off on PIV placement at this time. She was instructed to reconsult VAST if an additional PIV is need in the future.

## 2021-12-20 NOTE — Assessment & Plan Note (Addendum)
-  C/w Oxycodone to 20 mg every 6 hours.  Per database, fills 120 for 30 days -Palliative care consulted for further goals of care and pain adjustment Fentanyl Patch was started but stopped given Rash but unlikely contributing so will re-challenge; now back on the fentanyl patch and palliative following for pain and adjustments -Patient is having some constipation so they have added bowel regimen -Discontinued V Dilaudid -Continue pain control but may need to adjust given his sleepiness

## 2021-12-20 NOTE — Assessment & Plan Note (Addendum)
Multifactorial including shock, sepsis, uremia, hepatic encephalopathy or polypharmacy.  Seems to have improved.  He is oriented x4 except time. -Discontinue lactulose in the setting of GI bleed -Treat treatable causes -Minimize sedating medications -Reorientation and delirium precautions.

## 2021-12-20 NOTE — Assessment & Plan Note (Addendum)
-  Wheelchair-bound at baseline.  He says he is able to transfer using left leg at baseline. -PT/OT recommended SNF -Left leg evaluation as above

## 2021-12-20 NOTE — Progress Notes (Signed)
PROGRESS NOTE  Derek Anon Didio Jr. ZOX:[id number] DOB: December 29, 1950   PCP: [name], FNP  Patient is from: Home  DOA: 12/18/2021 LOS: 2  Chief complaints Chief Complaint  Patient presents with   Failure To Thrive     Brief Narrative / Interim history: 71 year old M with PMH of EtOH cirrhosis, nonimmune hemolytic anemia, pancytopenia, right AKA and hepatitis C brought to ED with altered mental status after he slid off his bed and found down on the ground by family, and admitted to ICU with hypovolemic shock, hemolytic anemia, liver failure, encephalopathy, AKI and hyperkalemia.  In ED, hypotensive and altered.  Hgb 4.0.  Lactic acid> 9.  AST 495.  ALT 107.  Bili 8.  He was started on Levophed, IV antibiotics and blood transfusion.  GI and hematology consulted.  RUQ ultrasound concerning for liver cirrhosis and liver mass.   Patient came off Levophed and transferred to [facility] hospitalist service on 5/24.   MRI of liver without contrast was very limited due to IV contrast and respiratory motion artifact but concerning for liver cirrhosis, portal HTN, splenomegaly, ascites, gallbladder wall thickening with surrounding inflammatory fat stranding and concern for possible small abscess or perforation with potential for cholecystocolonic fistula.  CT abdomen and pelvis ordered as recommended by radiology     Subjective: Seen and examined earlier this morning.  No major events overnight of this morning.  Hemoglobin was 6.7 earlier in the morning.  Transfused 1 unit with appropriate response.  He complains of pain all over.  He said this is chronic.  No other complaints but not a great historian.  He is oriented x4.  He is being cleaned by nursing staff.  He denies chest pain, dyspnea, nausea or vomiting.  Objective: Vitals:   12/20/21 1000 12/20/21 1200 12/20/21 1224 12/20/21 1255  BP: (!) 147/52  (!) 149/49   Pulse: 76  80 80  Resp: '19  20 20  '$ Temp:  98.2 F (36.8 C)     TempSrc:  Axillary    SpO2: 96%  100% 100%  Weight:      Height:        Examination:  GENERAL: No apparent distress.  Nontoxic. HEENT: MMM.  Vision and hearing grossly intact.  NECK: Supple.  No apparent JVD.  RESP:  No IWOB.  Fair aeration bilaterally. CVS:  RRR. Heart sounds normal.  ABD/GI/GU: BS+. Abd soft, NTND.  MSK/EXT: Right AKA. SKIN: Some skin jaundice.  Bruising over his left fifth toe and adjacent part of his foot.  Bruising in his left forearm and elbow.  No obvious bleeding. NEURO: Awake, alert and oriented x4 except time.  No apparent focal neuro deficit. PSYCH: Calm. Normal affect.   Procedures:  None  Microbiology summarized: [id number] and influenza PCR nonreactive. MRSA PCR screen positive. Blood cultures NGTD Urine cultures NGTD  Assessment and Plan: * Hypovolemic shock (HCC) Multifactorial including dehydration and acute on chronic hemolytic anemia in the setting of liver cirrhosis and worsening liver failure.  Cannot exclude infectious etiology although he has no fever or leukocytosis.  However, he had lactic acidosis and significant procalcitonin.  Blood and urine cultures NGTD.  Low suspicion for SBP on exam -Shock physiology resolved.  He received about 4 units of blood so far.  Hgb 9.6 this morning. -Continue IV fluid given AKI and rhabdomyolysis -Continue antibiotic with IV ceftriaxone for now -Monitor H&H  Acute metabolic encephalopathy Multifactorial including shock, possible infectious etiology, uremia, hepatic encephalopathy or polypharmacy.  Seems  to have improved.  He is oriented x4 except time. -Continue lactulose -Treat treatable causes -Minimize sedating medications -Reorientation and delirium precautions.  AKI (acute kidney injury) ([place]) Recent Labs    12/21/20 1057 12/28/20 1241 01/11/21 1518 02/03/21 1037 05/22/21 1037 12/18/21 1124 12/18/21 1751 12/19/21 1030 12/20/21 0223  BUN '14 16 18 11 15 '$ 38* 42* 56* 63*  CREATININE  0.59 0.75 0.68 0.71 0.54* 1.70* 1.44* 1.70* 1.89*  Creatinine seems to be rising.  CK elevated to 4200.  He is also nephrotoxic meds at home. -Start IV fluid at 125 cc an hour -Recheck renal function in the morning -CT abdomen and pelvis -Avoid nephrotoxic meds -Further evaluation if worse   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 Elevated liver enzymes/hyperbilirubinemia Recent Labs  Lab 12/18/21 1124 12/18/21 1751 12/19/21 1030 12/20/21 0223  AST 495* 2,284* 1,436* 678*  ALT 107* 545* 627* 487*  ALKPHOS 70 66 69 71  BILITOT 8.5* 8.8* 5.6* 3.9*  PROT 7.4 6.1* 5.9* 5.7*  ALBUMIN 3.5 2.9* 2.7* 2.5*  LFT pattern consistent rhabdo.  CK elevated to 4231 today.  -Start IV fluid -Check coag labs and ammonia in the morning -Continue monitoring  Hemolytic anemia (HCC) Concern for non-autoimmune hemolytic anemia due to liver cirrhosis Recent Labs    06/19/21 0948 07/27/21 1313 08/22/21 1329 11/21/21 1349 12/18/21 1124 12/18/21 2207 12/19/21 0621 12/19/21 1151 12/20/21 0223 12/20/21 0801  HGB 8.0* 8.6* 10.1* 8.5* 4.0* 6.7* 7.1* 7.2* 6.7* 9.6*  Transfused about 4 units with appropriate response.  LDH markedly elevated.   -Follow haptoglobin -Monitor H&H -Appreciate help by hematology/oncology   Hyperkalemia and hyponatremia Hyperkalemia resolved.  Hyponatremia mild. -Continue monitoring  Rhabdomyolysis Likely traumatic from recent fall. -IV fluid -Continue monitoring  Abnormal MRI, liver RUQ [place] concerning for 6 cm liver mass -Follow CT abdomen and pelvis.  Thrombocytopenia ([place]) Recent Labs  Lab 12/18/21 1124 12/18/21 2207 12/19/21 0621 12/19/21 1151 12/20/21 0223  PLT 78* 58* 40* 38* 31*  Continue monitoring   Physical deconditioning PT/OT.  Liver mass RUQ ultrasound raises concern for liver mass.  He has alcoholic liver cirrhosis and history of hep C.  He is at risk for [facility].  Unfortunately, not able to use contrast.   Noncontrast MRI nondiagnostic. -Follow CT abdomen and pelvis without contrast -Follow AFP -Will obtain CT or MRI when AKI improves.  Chronic pain syndrome Radius oxycodone to 20 mg every 6 hours.  Per database, feels 120 for 30 days  Pressure skin injury: POA Pressure Injury 12/18/21 Foot Anterior;Left;Lateral Deep Tissue Pressure Injury - Purple or maroon localized area of discolored intact skin or blood-filled blister due to damage of underlying soft tissue from pressure and/or shear. purple, red (Active)  12/18/21 1530  Location: Foot  Location Orientation: Anterior;Left;Lateral  Staging: Deep Tissue Pressure Injury - Purple or maroon localized area of discolored intact skin or blood-filled blister due to damage of underlying soft tissue from pressure and/or shear.  Wound Description (Comments): purple, red  Present on Admission: Yes  Dressing Type Foam - Lift dressing to assess site every shift 12/20/21 0800   DVT prophylaxis:  SCDs Start: 12/18/21 1421  Code Status: Full code Family Communication: Patient and/or RN. Available if any question.  Level of care: Progressive Status is: Inpatient Remains inpatient appropriate because: Hemolytic anemia, AKI and liver failure   Final disposition: SNF Consultants:  Pulmonology Oncology/hematology  Sch Meds:  Scheduled Meds:  (feeding supplement) PROSource Plus  30 mL Oral Daily   Chlorhexidine  Gluconate Cloth  6 each Topical Daily   [START ON 12/21/2021] Chlorhexidine Gluconate Cloth  6 each Topical Q0600   feeding supplement  1 Container Oral BID BM   feeding supplement  237 mL Oral Q24H   lactulose  20 g Oral BID   levothyroxine  50 mcg Oral Q0600   mouth rinse  15 mL Mouth Rinse BID   multivitamin with minerals  1 tablet Oral Daily   mupirocin ointment  1 application. Nasal BID   pantoprazole sodium  40 mg Oral Daily   Continuous Infusions:  sodium chloride     sodium chloride     cefTRIAXone (ROCEPHIN)  IV Stopped  (12/20/21 1258)   PRN Meds:.Place/Maintain arterial line **AND** sodium chloride, acetaminophen, docusate, oxyCODONE, polyethylene glycol  Antimicrobials: Anti-infectives (From admission, onward)    Start     Dose/Rate Route Frequency Ordered Stop   12/20/21 1100  cefTRIAXone (ROCEPHIN) 1 g in sodium chloride 0.9 % 100 mL IVPB        1 g 200 mL/hr over 30 Minutes Intravenous Every 24 hours 12/20/21 1006     12/19/21 2000  vancomycin (VANCOREADY) IVPB 1250 mg/250 mL  Status:  Discontinued        1,250 mg 166.7 mL/hr over 90 Minutes Intravenous Every 24 hours 12/18/21 1946 12/19/21 1207   12/19/21 1100  ceFEPIme (MAXIPIME) 2 g in sodium chloride 0.9 % 100 mL IVPB  Status:  Discontinued        2 g 200 mL/hr over 30 Minutes Intravenous Every 12 hours 12/19/21 1038 12/20/21 1006   12/19/21 0600  ceFEPIme (MAXIPIME) 2 g in sodium chloride 0.9 % 100 mL IVPB  Status:  Discontinued        2 g 200 mL/hr over 30 Minutes Intravenous Every 12 hours 12/18/21 1943 12/19/21 1038   12/18/21 1845  vancomycin (VANCOREADY) IVPB 2000 mg/400 mL        2,000 mg 200 mL/hr over 120 Minutes Intravenous STAT 12/18/21 1828 12/18/21 2151   12/18/21 1830  ceFEPIme (MAXIPIME) 2 g in sodium chloride 0.9 % 100 mL IVPB        2 g 200 mL/hr over 30 Minutes Intravenous STAT 12/18/21 1818 12/18/21 1951        I have personally reviewed the following labs and images: CBC: Recent Labs  Lab 12/18/21 1124 12/18/21 2207 12/19/21 0621 12/19/21 1151 12/20/21 0223 12/20/21 0801  WBC 8.1 10.2 6.9 8.2 8.6  --   NEUTROABS 6.5  --   --   --   --   --   HGB 4.0* 6.7* 7.1* 7.2* 6.7* 9.6*  HCT 12.5* 20.1* 22.1* 21.3* 20.3* 29.0*  MCV 105.0* 98.5 96.9 95.5 94.9  --   PLT 78* 58* 40* 38* 31*  --    BMP &GFR Recent Labs  Lab 12/18/21 1124 12/18/21 1751 12/19/21 0621 12/19/21 1030 12/20/21 0223  NA 135 136  --  135 134*  K 5.5* 5.1  --  4.5 3.9  CL 96* 100  --  102 103  CO2 17* 19*  --  21* 21*  GLUCOSE 145*  213*  --  208* 203*  BUN 38* 42*  --  56* 63*  CREATININE 1.70* 1.44*  --  1.70* 1.89*  CALCIUM 8.1* 7.5*  --  7.6* 7.8*  MG  --   --  1.8  --  2.3  PHOS  --   --  4.7*  --   --    Estimated  Creatinine Clearance: 38.8 mL/min (A) (by C-G formula based on SCr of 1.89 mg/dL (H)). Liver & Pancreas: Recent Labs  Lab 12/18/21 1124 12/18/21 1751 12/19/21 1030 12/20/21 0223  AST 495* 2,284* 1,436* 678*  ALT 107* 545* 627* 487*  ALKPHOS 70 66 69 71  BILITOT 8.5* 8.8* 5.6* 3.9*  PROT 7.4 6.1* 5.9* 5.7*  ALBUMIN 3.5 2.9* 2.7* 2.5*   Recent Labs  Lab 12/18/21 1124  LIPASE 30   Recent Labs  Lab 12/18/21 1222  AMMONIA 52*   Diabetic: Recent Labs    12/20/21 0801  HGBA1C 4.9   Recent Labs  Lab 12/18/21 1544  GLUCAP 59*   Cardiac Enzymes: Recent Labs  Lab 12/20/21 0801  CKTOTAL 4,231*   No results for input(s): PROBNP in the last 8760 hours. Coagulation Profile: Recent Labs  Lab 12/18/21 1124  INR 1.8*   Thyroid Function Tests: No results for input(s): TSH, T4TOTAL, FREET4, T3FREE, THYROIDAB in the last 72 hours. Lipid Profile: No results for input(s): CHOL, HDL, LDLCALC, TRIG, CHOLHDL, LDLDIRECT in the last 72 hours. Anemia Panel: Recent Labs    12/18/21 1753 12/18/21 2207  VITAMINB12 3,195*  --   RETICCTPCT  --  4.2*   Urine analysis:    Component Value Date/Time   COLORURINE AMBER (A) 12/18/2021 1139   APPEARANCEUR CLEAR 12/18/2021 1139   LABSPEC 1.013 12/18/2021 1139   PHURINE 5.0 12/18/2021 1139   GLUCOSEU NEGATIVE 12/18/2021 1139   GLUCOSEU NEGATIVE 01/06/2015 0912   HGBUR LARGE (A) 12/18/2021 1139   BILIRUBINUR NEGATIVE 12/18/2021 1139   KETONESUR 5 (A) 12/18/2021 1139   PROTEINUR 100 (A) 12/18/2021 1139   UROBILINOGEN 1.0 01/06/2015 0912   NITRITE NEGATIVE 12/18/2021 1139   LEUKOCYTESUR NEGATIVE 12/18/2021 1139   Sepsis Labs: Invalid input(s): PROCALCITONIN, [place]  Microbiology: Recent Results (from the past 240 hour(s))  Blood  Culture (routine x 2)     Status: None (Preliminary result)   Collection Time: 12/18/21 11:24 AM   Specimen: BLOOD  Result Value Ref Range Status   Specimen Description   Final    BLOOD BLOOD RIGHT HAND Performed at [facility] [address] [place], [place] [address]    Special Requests   Final    BOTTLES DRAWN AEROBIC AND ANAEROBIC Blood Culture adequate volume Performed at [facility] [address], [place], [place] [address]    Culture   Final    NO GROWTH 2 DAYS Performed at [facility] [address] [place], [place] [address]    Report Status PENDING  Incomplete  Blood Culture (routine x 2)     Status: None (Preliminary result)   Collection Time: 12/18/21 11:32 AM   Specimen: BLOOD  Result Value Ref Range Status   Specimen Description   Final    BLOOD RIGHT ANTECUBITAL Performed at [facility] [address], [place], [place] [address]    Special Requests   Final    BOTTLES DRAWN AEROBIC AND ANAEROBIC Blood Culture results may not be optimal due to an excessive volume of blood received in culture bottles Performed at [facility] [address] [place], [place] [address]    Culture   Final    NO GROWTH 2 DAYS Performed at [facility] [address] [place],  [address]    Report Status PENDING  Incomplete  Urine Culture     Status: None   Collection Time: 12/18/21 11:39 AM  Specimen: In/Out Cath Urine  Result Value Ref Range Status   Specimen Description   Final    IN/OUT CATH URINE Performed at [facility] [address], [place], [place] [address]    Special Requests   Final    NONE Performed at [facility], [place] [address], [place], [place] [address]    Culture   Final    NO GROWTH Performed at [facility], [place] [address], [place], [place] [address]    Report Status 12/20/2021 FINAL  Final  Resp  Panel by RT-PCR (Flu A&B, Covid) Nasopharyngeal Swab     Status: None   Collection Time: 12/18/21 12:22 PM   Specimen: Nasopharyngeal Swab; Nasopharyngeal(NP) swabs in vial transport medium  Result Value Ref Range Status   SARS Coronavirus 2 by RT PCR NEGATIVE NEGATIVE Final    Comment: (NOTE) SARS-CoV-2 target nucleic acids are NOT DETECTED.  The SARS-CoV-2 RNA is generally detectable in upper respiratory specimens during the acute phase of infection. The lowest concentration of SARS-CoV-2 viral copies this assay can detect is 138 copies/mL. A negative result does not preclude SARS-Cov-2 infection and should not be used as the sole basis for treatment or other patient management decisions. A negative result may occur with  improper specimen collection/handling, submission of specimen other than nasopharyngeal swab, presence of viral mutation(s) within the areas targeted by this assay, and inadequate number of viral copies(<138 copies/mL). A negative result must be combined with clinical observations, patient history, and epidemiological information. The expected result is Negative.  Fact Sheet for Patients:  EntrepreneurPulse.com.au  Fact Sheet for Healthcare Providers:  IncredibleEmployment.be  This test is no t yet approved or cleared by the [place] FDA and  has been authorized for detection and/or diagnosis of SARS-CoV-2 by FDA under an Emergency Use Authorization (EUA). This EUA will remain  in effect (meaning this test can be used) for the duration of the COVID-19 declaration under Section 564(b)(1) of the Act, 21 U.S.C.section 360bbb-3(b)(1), unless the authorization is terminated  or revoked sooner.       Influenza A by PCR NEGATIVE NEGATIVE Final   Influenza B by PCR NEGATIVE NEGATIVE Final    Comment: (NOTE) The Xpert Xpress SARS-CoV-2/FLU/RSV plus assay is intended as an aid in the diagnosis of influenza from Nasopharyngeal  swab specimens and should not be used as a sole basis for treatment. Nasal washings and aspirates are unacceptable for Xpert Xpress SARS-CoV-2/FLU/RSV testing.  Fact Sheet for Patients: EntrepreneurPulse.com.au  Fact Sheet for Healthcare Providers: IncredibleEmployment.be  This test is not yet approved or cleared by the [place] FDA and has been authorized for detection and/or diagnosis of SARS-CoV-2 by FDA under an Emergency Use Authorization (EUA). This EUA will remain in effect (meaning this test can be used) for the duration of the COVID-19 declaration under Section 564(b)(1) of the Act, 21 U.S.C. section 360bbb-3(b)(1), unless the authorization is terminated or revoked.  Performed at [facility], [place] [address] [place], [place] [address]   MRSA Next Gen by PCR, Nasal     Status: Abnormal   Collection Time: 12/20/21  9:40 AM   Specimen: Nasal Mucosa; Nasal Swab  Result Value Ref Range Status   MRSA by PCR Next Gen DETECTED (A) NOT DETECTED Final    Comment: (NOTE) The GeneXpert MRSA Assay (FDA approved for NASAL specimens only), is one component of a comprehensive MRSA colonization surveillance program. It is not intended to diagnose MRSA infection  nor to guide or monitor treatment for MRSA infections. Test performance is not FDA approved in patients less than 60 years old. Performed at [facility], [place] [address] [place], [place] [address]     Radiology Studies: MR LIVER WO CONRTAST  Result Date: 12/20/2021 CLINICAL DATA:  Evaluate for hepatocellular carcinoma. EXAM: MRI ABDOMEN WITHOUT CONTRAST TECHNIQUE: Multiplanar multisequence MR imaging was performed without the administration of intravenous contrast. COMPARISON:  Abdominal sonogram 12/18/2021 FINDINGS: Lower chest: Atelectasis versus airspace disease noted within the left base. Hepatobiliary: Diffuse low signal throughout the liver  on the in-phase sequence which increases in signal intensity on the out of phase sequences suggest iron deposition within the liver. There is hypertrophy of the lateral segment of left hepatic lobe as well as the caudate lobe. Contour the liver has a macro nodular appearance compatible with cirrhosis. Evaluation for underlying mass lesion is significantly limited due to lack of IV contrast material and respiratory motion artifact. Furthermore, patient was unable to breath hold and requested early termination of the exam. Within this limitation no large liver mass identified. Multiple stones are identified within the gallbladder. These all measure around 4 mm. The gallbladder wall appears diffusely edematous with surrounding inflammatory fat stranding. There is a focal fluid collection which extends beyond the normal margins of the anterior wall of the gallbladder abutting the hepatic flexure, image 25/4 and image 14/6. As mentioned above examination is significantly limited reflecting motion artifact and lack of IV contrast material. No signs of common bile duct or intrahepatic bile duct dilatation. Pancreas:  No acute abnormality. Spleen:  The spleen is enlarged measuring 16.9 cm cranial caudal. Adrenals/Urinary Tract: Normal appearance of the adrenal glands. No kidney mass or signs of hydronephrosis. Stomach/Bowel: No abnormal bowel dilatation identified. Vascular/Lymphatic: No pathologically enlarged lymph nodes identified. No abdominal aortic aneurysm demonstrated. Other: There is free fluid identified within the upper abdomen extending over the liver dome. Musculoskeletal: Asymmetric increased signal intensity is identified localizing to the visualized portions of the left gluteus musculature, image 46/4. IMPRESSION: 1. Examination is limited due to lack of IV contrast material and respiratory motion artifact. Within this limitation no large liver mass identified. However study is not diagnostic for excluding 8  cc in a patient who is at increased risk. 2. Morphologic features of the liver compatible with cirrhosis with stigmata of portal venous hypertension including splenomegaly and ascites. 3. Gallstones with diffuse gallbladder wall thickening and surrounding inflammatory fat stranding. A small volume of free fluid is identified extending over the dome of liver. There is a focal fluid collection which extends beyond the normal margins of the anterior wall of the gallbladder abutting the hepatic flexure. This may represent a small abscess or focal perforation of the gallbladder. Cholecystocolonic fistula is also a potential consideration. Further evaluation with CT of the abdomen pelvis is recommended. 4. Asymmetric increased signal intensity identified localizing to the visualized portions of the left gluteus musculature. This is only partially visualized and is of uncertain significance. Attention on follow-up CT is advised. 5. Critical Value/emergent results were called by telephone at the time of interpretation on 12/20/2021 at 11:45 am to provider [facility] , who verbally acknowledged these results. Electronically Signed   By: Kerby Moors M.D.   On: 12/20/2021 11:46      Huckleberry Martinson T[address] [place]  If 7PM-7AM, please contact night-coverage [email or web address] 12/20/2021, 2:33 PM

## 2021-12-20 NOTE — Progress Notes (Signed)
OT Cancellation Note  Patient Details Name: [name]. MRN: [id number] DOB: 08/03/1950   Cancelled Treatment:    Reason Eval/Treat Not Completed: Other (comment) Patient approached this AM with nursing in process of completing hygiene, returned in PM with patient going to CT. OT to continue to follow and check back as schedule will allow.  Jackelyn Poling OTR/L, [place] [facility] Office# [phone] Pager# [phone] 12/20/2021, 2:11 PM

## 2021-12-20 NOTE — Progress Notes (Addendum)
CRITICAL VALUE STICKER  CRITICAL VALUE: hgb 6.7  RECEIVER (on-site recipient of call): Harvie Junior RN  DATE & TIME NOTIFIED: 12/20/21 0245  MESSENGER (representative from lab): Elsworth Soho  MD NOTIFIED: Loanne Drilling, MD  TIME OF NOTIFICATION: 12/20/21 0246  RESPONSE: paged, waiting for orders

## 2021-12-20 NOTE — Assessment & Plan Note (Addendum)
Hyperkalemia mild and is now at 5.2 so was given a dose of Lokelma and is now 4.9 Hyponatremia resolved; last sodium went from 137 is now 135 -> 134 and trended down to 130 -> 132 -Patient's Mag Level is now 1.8 x3 -Continue to monitor and replete as necessary; -Repeat CMP, mag, Phos in the AM

## 2021-12-21 ENCOUNTER — Inpatient Hospital Stay (HOSPITAL_COMMUNITY): Payer: Medicare Other

## 2021-12-21 DIAGNOSIS — R202 Paresthesia of skin: Secondary | ICD-10-CM | POA: Diagnosis not present

## 2021-12-21 DIAGNOSIS — I739 Peripheral vascular disease, unspecified: Secondary | ICD-10-CM | POA: Diagnosis not present

## 2021-12-21 DIAGNOSIS — R29898 Other symptoms and signs involving the musculoskeletal system: Secondary | ICD-10-CM

## 2021-12-21 DIAGNOSIS — K8042 Calculus of bile duct with acute cholecystitis without obstruction: Secondary | ICD-10-CM

## 2021-12-21 DIAGNOSIS — A419 Sepsis, unspecified organism: Secondary | ICD-10-CM

## 2021-12-21 DIAGNOSIS — K7031 Alcoholic cirrhosis of liver with ascites: Secondary | ICD-10-CM | POA: Diagnosis not present

## 2021-12-21 DIAGNOSIS — N179 Acute kidney failure, unspecified: Secondary | ICD-10-CM | POA: Diagnosis not present

## 2021-12-21 DIAGNOSIS — R778 Other specified abnormalities of plasma proteins: Secondary | ICD-10-CM

## 2021-12-21 DIAGNOSIS — R531 Weakness: Secondary | ICD-10-CM

## 2021-12-21 DIAGNOSIS — G9341 Metabolic encephalopathy: Secondary | ICD-10-CM | POA: Diagnosis not present

## 2021-12-21 DIAGNOSIS — R6521 Severe sepsis with septic shock: Secondary | ICD-10-CM

## 2021-12-21 DIAGNOSIS — R571 Hypovolemic shock: Secondary | ICD-10-CM | POA: Diagnosis not present

## 2021-12-21 LAB — BPAM RBC
Blood Product Expiration Date: 202306202359
Blood Product Expiration Date: 202306252359
Blood Product Expiration Date: 202306292359
Blood Product Expiration Date: 202307012359
ISSUE DATE / TIME: 202305221552
ISSUE DATE / TIME: 202305221811
ISSUE DATE / TIME: 202305230024
ISSUE DATE / TIME: 202305240417
Unit Type and Rh: 9500
Unit Type and Rh: 9500
Unit Type and Rh: 9500
Unit Type and Rh: 9500

## 2021-12-21 LAB — TYPE AND SCREEN
ABO/RH(D): B NEG
Antibody Screen: POSITIVE
Unit division: 0
Unit division: 0
Unit division: 0
Unit division: 0

## 2021-12-21 LAB — CBC
HCT: 20.8 % — ABNORMAL LOW (ref 39.0–52.0)
HCT: 22.3 % — ABNORMAL LOW (ref 39.0–52.0)
Hemoglobin: 7.1 g/dL — ABNORMAL LOW (ref 13.0–17.0)
Hemoglobin: 7.2 g/dL — ABNORMAL LOW (ref 13.0–17.0)
MCH: 32.1 pg (ref 26.0–34.0)
MCH: 31.4 pg (ref 26.0–34.0)
MCHC: 34.1 g/dL (ref 30.0–36.0)
MCHC: 32.3 g/dL (ref 30.0–36.0)
MCV: 94.1 fL (ref 80.0–100.0)
MCV: 97.4 fL (ref 80.0–100.0)
Platelets: 25 10*3/uL — CL (ref 150–400)
Platelets: 21 10*3/uL — CL (ref 150–400)
RBC: 2.21 MIL/uL — ABNORMAL LOW (ref 4.22–5.81)
RBC: 2.29 MIL/uL — ABNORMAL LOW (ref 4.22–5.81)
RDW: 18.9 % — ABNORMAL HIGH (ref 11.5–15.5)
RDW: 19.1 % — ABNORMAL HIGH (ref 11.5–15.5)
WBC: 5.7 10*3/uL (ref 4.0–10.5)
WBC: 5.9 10*3/uL (ref 4.0–10.5)
nRBC: 0.7 % — ABNORMAL HIGH (ref 0.0–0.2)
nRBC: 0.7 % — ABNORMAL HIGH (ref 0.0–0.2)

## 2021-12-21 LAB — CK: Total CK: 7837 U/L — ABNORMAL HIGH (ref 49–397)

## 2021-12-21 LAB — PROTIME-INR
INR: 1.4 — ABNORMAL HIGH (ref 0.8–1.2)
Prothrombin Time: 17.3 seconds — ABNORMAL HIGH (ref 11.4–15.2)

## 2021-12-21 LAB — AMMONIA: Ammonia: 30 umol/L (ref 9–35)

## 2021-12-21 LAB — COMPREHENSIVE METABOLIC PANEL
ALT: 330 U/L — ABNORMAL HIGH (ref 0–44)
AST: 402 U/L — ABNORMAL HIGH (ref 15–41)
Albumin: 2.3 g/dL — ABNORMAL LOW (ref 3.5–5.0)
Alkaline Phosphatase: 77 U/L (ref 38–126)
Anion gap: 6 (ref 5–15)
BUN: 42 mg/dL — ABNORMAL HIGH (ref 8–23)
CO2: 24 mmol/L (ref 22–32)
Calcium: 7.9 mg/dL — ABNORMAL LOW (ref 8.9–10.3)
Chloride: 104 mmol/L (ref 98–111)
Creatinine, Ser: 1.11 mg/dL (ref 0.61–1.24)
GFR, Estimated: >60 mL/min (ref >60)
Glucose, Bld: 148 mg/dL — ABNORMAL HIGH (ref 70–99)
Potassium: 4.3 mmol/L (ref 3.5–5.1)
Sodium: 134 mmol/L — ABNORMAL LOW (ref 135–145)
Total Bilirubin: 2.2 mg/dL — ABNORMAL HIGH (ref 0.3–1.2)
Total Protein: 5.4 g/dL — ABNORMAL LOW (ref 6.5–8.1)

## 2021-12-21 LAB — AFP TUMOR MARKER: AFP, Serum, Tumor Marker: <1.8 ng/mL (ref 0.0–8.4)

## 2021-12-21 LAB — PHOSPHORUS: Phosphorus: 3.3 mg/dL (ref 2.5–4.6)

## 2021-12-21 LAB — MAGNESIUM: Magnesium: 1.9 mg/dL (ref 1.7–2.4)

## 2021-12-21 MED ORDER — SODIUM CHLORIDE 0.9 % IV SOLN
2.0000 g | INTRAVENOUS | Status: DC
  Administered 2021-12-21 – 2021-12-22 (×2): 2 g via INTRAVENOUS
  Filled 2021-12-21 (×2): qty 20

## 2021-12-21 MED ORDER — LORAZEPAM 0.5 MG PO TABS
0.5000 mg | ORAL_TABLET | Freq: Four times a day (QID) | ORAL | Status: DC | PRN
  Administered 2021-12-21 – 2021-12-25 (×10): 0.5 mg via ORAL
  Filled 2021-12-21 (×12): qty 1

## 2021-12-21 NOTE — Progress Notes (Signed)
PROGRESS NOTE  Derek Anon Molony Jr. NTZ:[id number] DOB: 08-06-1950   PCP: [name], FNP  Patient is from: Home.  Uses wheelchair at baseline.  DOA: 12/18/2021 LOS: 3  Chief complaints Chief Complaint  Patient presents with   Failure To Thrive     Brief Narrative / Interim history: 71 year old M with PMH of EtOH cirrhosis, nonimmune hemolytic anemia, pancytopenia, right AKA and hepatitis C brought to ED with altered mental status after he slid off his bed and found down on the ground by family, and admitted to ICU with hypovolemic shock, hemolytic anemia, liver failure, encephalopathy, AKI and hyperkalemia.  In ED, hypotensive and altered.  Hgb 4.0.  Lactic acid> 9.  AST 495.  ALT 107.  Bili 8.  He was started on Levophed, IV antibiotics and blood transfusion.  GI and hematology consulted.  RUQ ultrasound concerning for liver cirrhosis and liver mass.   Patient came off Levophed and transferred to [facility] hospitalist service on 5/24.   MRI of liver without contrast was very limited due to IV contrast and respiratory motion artifact but concerning for liver cirrhosis, portal HTN, splenomegaly, ascites, gallbladder wall thickening with surrounding inflammatory fat stranding and concern for possible small abscess or perforation with potential for cholecystocolonic fistula.  CT abdomen and pelvis ordered as recommended by radiology, and raised concern for calculus cholecystitis and possible cholecystocolonic fistula.  General surgery consulted.     Subjective: Seen and examined earlier this morning.  No major events overnight of this morning.  He reports hurting all over which is chronic for him.  He is concerned about left lower extremity weakness and numbness.  He says he normally uses wheelchair for mobility but uses left leg for transfer.  Numbness is below his knees.  He denies chest pain, shortness of breath, GI or UTI symptoms.  Objective: Vitals:   12/21/21 0403 12/21/21  0500 12/21/21 0600 12/21/21 0800  BP:  (!) 116/47 (!) 127/51   Pulse: 75 75 72   Resp: '12 14 13   '$ Temp: 97.6 F (36.4 C)   98.1 F (36.7 C)  TempSrc: Oral   Axillary  SpO2: 97% 97% 98%   Weight:  97.1 kg    Height:        Examination:  GENERAL: No apparent distress.  Nontoxic. HEENT: MMM.  Vision and hearing grossly intact.  NECK: Supple.  No apparent JVD.  RESP:  No IWOB.  Fair aeration bilaterally. CVS:  RRR. Heart sounds normal.  ABD/GI/GU: BS+. Abd soft, NTND.  MSK/EXT: Right AKA.  Not able to move left leg.  SKIN: Skin changes in keeping with venous insufficiency in LLE. NEURO: Awake and alert. Oriented appropriately.  LLE weakness (2/5).  Neuro exam intact elsewhere. PSYCH: Calm. Normal affect.   Procedures:  None  Microbiology summarized: [id number] and influenza PCR nonreactive. MRSA PCR screen positive. Blood cultures NGTD Urine cultures NGTD  Assessment and Plan: * Septic shock (HCC) Tachypneic, hypotensive with AKI, lactic acidosis, thrombocytopenia requiring vasopressors on admission.  Imaging raises concern for calculus cholecystitis and possible cholecysto colonic fistula. Surprisingly, his abdominal exam is benign.  Blood and urine cultures NGTD.  There is also some degree of hypovolemic shock from dehydration and acute on chronic hemolytic anemia in the setting of liver cirrhosis and worsening liver failure.    Shock physiology resolved. -General surgery: continue antibiotics and consider perc chole if worse.  -Continue IV fluid given AKI and rhabdomyolysis  Acute metabolic encephalopathy Multifactorial including shock, sepsis, uremia,  hepatic encephalopathy or polypharmacy.  Seems to have improved.  He is oriented x4 except time. -Continue lactulose -Treat treatable causes -Minimize sedating medications -Reorientation and delirium precautions.  AKI (acute kidney injury) ([place]) Recent Labs    12/28/20 1241 01/11/21 1518 02/03/21 1037 05/22/21 1037  12/18/21 1124 12/18/21 1751 12/19/21 1030 12/20/21 0223 12/21/21 0244  BUN '16 18 11 15 '$ 38* 42* 56* 63* 42*  CREATININE 0.75 0.68 0.71 0.54* 1.70* 1.44* 1.70* 1.89* 1.11  Likely due to hypotension and rhabdomyolysis.  Improving. -Continue IV fluid -Recheck renal function in the morning -Avoid nephrotoxic meds   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 Elevated liver enzymes/hyperbilirubinemia Recent Labs  Lab 12/18/21 1124 12/18/21 1751 12/19/21 1030 12/20/21 0223 12/21/21 0244  AST 495* 2,284* 1,436* 678* 402*  ALT 107* 545* 627* 487* 330*  ALKPHOS 70 66 69 71 77  BILITOT 8.5* 8.8* 5.6* 3.9* 2.2*  PROT 7.4 6.1* 5.9* 5.7* 5.4*  ALBUMIN 3.5 2.9* 2.7* 2.5* 2.3*  LFT pattern consistent rhabdo.  CK elevated to 4231> 7800.  He has mild coagulopathy as well. -Continue IV fluid -Continue monitoring LFT and CK  Hemolytic anemia ([place]) Concern for non-autoimmune hemolytic anemia due to liver cirrhosis and splenomegaly Recent Labs    07/27/21 1313 08/22/21 1329 11/21/21 1349 12/18/21 1124 12/18/21 2207 12/19/21 0621 12/19/21 1151 12/20/21 0223 12/20/21 0801 12/21/21 0244  HGB 8.6* 10.1* 8.5* 4.0* 6.7* 7.1* 7.2* 6.7* 9.6* 7.1*  Transfused about 4 units so far.  LDH markedly elevated.  Haptoglobin low -Recheck CBC at 1 PM -Appreciate help by hematology/oncology -Transfuse for Hgb <7.0.   Left leg weakness and numbness Unclear how much of this is acute.  He has contracture about his left knee.  He is wheelchair-bound at baseline.  He has no palpable pulses either.  He has a small ecchymosis over the lateral aspect of his left foot adjacent to his fifth toe.  He has no focal neurodeficit elsewhere. -Check ABI  Hyperkalemia and hyponatremia Hyperkalemia resolved.  Hyponatremia mild. -Continue monitoring  Traumatic rhabdomyolysis ([place]) Likely traumatic from recent fall.  CK 4231>> 7835 -Continue IV fluid-increase rate -Continue  monitoring  Abnormal MRI, liver RUQ [place] concerning for 6 cm liver mass.  Not appreciated on MRI and CT although those tests are nondiagnostic without contrast. -Consider CT with contrast or MRI when able  Thrombocytopenia [facility]) Recent Labs  Lab 12/18/21 1124 12/18/21 2207 12/19/21 0621 12/19/21 1151 12/20/21 0223 12/21/21 0244  PLT 78* 58* 40* 38* 31* 25*  Continue monitoring Transfuse for platelet < 10k or bleeding   Physical deconditioning Wheelchair-bound at baseline.  He says he is able to transfer using left leg at baseline. -PT/OT recommended SNF -Left leg evaluation as above  Elevated troponin Likely demand ischemia in the setting of sepsis and shock  Calculus of bile duct with acute cholecystitis See shock  Chronic pain syndrome Radius oxycodone to 20 mg every 6 hours.  Per database, feels 120 for 30 days  Pressure skin injury: POA Pressure Injury 12/18/21 Foot Anterior;Left;Lateral Deep Tissue Pressure Injury - Purple or maroon localized area of discolored intact skin or blood-filled blister due to damage of underlying soft tissue from pressure and/or shear. purple, red (Active)  12/18/21 1530  Location: Foot  Location Orientation: Anterior;Left;Lateral  Staging: Deep Tissue Pressure Injury - Purple or maroon localized area of discolored intact skin or blood-filled blister due to damage of underlying soft tissue from pressure and/or shear.  Wound Description (Comments): purple, red  Present  on Admission: Yes  Dressing Type Foam - Lift dressing to assess site every shift 12/20/21 2000   DVT prophylaxis:  SCDs Start: 12/18/21 1421  Code Status: Full code Family Communication: None at bedside.  Will update son later today Level of care: Progressive Status is: Inpatient Remains inpatient appropriate because: Septic shock, hemolytic anemia, rhabdomyolysis and physical deconditioning   Final disposition: SNF Consultants:   Pulmonology Oncology/hematology General surgery  Sch Meds:  Scheduled Meds:  (feeding supplement) PROSource Plus  30 mL Oral Daily   Chlorhexidine Gluconate Cloth  6 each Topical Daily   feeding supplement  1 Container Oral BID BM   feeding supplement  237 mL Oral Q24H   lactulose  20 g Oral BID   levothyroxine  50 mcg Oral Q0600   mouth rinse  15 mL Mouth Rinse BID   multivitamin with minerals  1 tablet Oral Daily   mupirocin ointment  1 application. Nasal BID   pantoprazole sodium  40 mg Oral Daily   Continuous Infusions:  sodium chloride     sodium chloride 125 mL/hr at 12/21/21 1026   cefTRIAXone (ROCEPHIN)  IV Stopped (12/21/21 1102)   metronidazole Stopped (12/21/21 0745)   PRN Meds:.Place/Maintain arterial line **AND** sodium chloride, acetaminophen, docusate, oxyCODONE, polyethylene glycol  Antimicrobials: Anti-infectives (From admission, onward)    Start     Dose/Rate Route Frequency Ordered Stop   12/21/21 1100  cefTRIAXone (ROCEPHIN) 2 g in sodium chloride 0.9 % 100 mL IVPB        2 g 200 mL/hr over 30 Minutes Intravenous Every 24 hours 12/21/21 0900     12/20/21 1830  metroNIDAZOLE (FLAGYL) IVPB 500 mg        500 mg 100 mL/hr over 60 Minutes Intravenous Every 12 hours 12/20/21 1727     12/20/21 1100  cefTRIAXone (ROCEPHIN) 1 g in sodium chloride 0.9 % 100 mL IVPB  Status:  Discontinued        1 g 200 mL/hr over 30 Minutes Intravenous Every 24 hours 12/20/21 1006 12/21/21 0900   12/19/21 2000  vancomycin (VANCOREADY) IVPB 1250 mg/250 mL  Status:  Discontinued        1,250 mg 166.7 mL/hr over 90 Minutes Intravenous Every 24 hours 12/18/21 1946 12/19/21 1207   12/19/21 1100  ceFEPIme (MAXIPIME) 2 g in sodium chloride 0.9 % 100 mL IVPB  Status:  Discontinued        2 g 200 mL/hr over 30 Minutes Intravenous Every 12 hours 12/19/21 1038 12/20/21 1006   12/19/21 0600  ceFEPIme (MAXIPIME) 2 g in sodium chloride 0.9 % 100 mL IVPB  Status:  Discontinued        2  g 200 mL/hr over 30 Minutes Intravenous Every 12 hours 12/18/21 1943 12/19/21 1038   12/18/21 1845  vancomycin (VANCOREADY) IVPB 2000 mg/400 mL        2,000 mg 200 mL/hr over 120 Minutes Intravenous STAT 12/18/21 1828 12/18/21 2151   12/18/21 1830  ceFEPIme (MAXIPIME) 2 g in sodium chloride 0.9 % 100 mL IVPB        2 g 200 mL/hr over 30 Minutes Intravenous STAT 12/18/21 1818 12/18/21 1951        I have personally reviewed the following labs and images: CBC: Recent Labs  Lab 12/18/21 1124 12/18/21 2207 12/19/21 0621 12/19/21 1151 12/20/21 0223 12/20/21 0801 12/21/21 0244  WBC 8.1 10.2 6.9 8.2 8.6  --  5.7  NEUTROABS 6.5  --   --   --   --   --   --  HGB 4.0* 6.7* 7.1* 7.2* 6.7* 9.6* 7.1*  HCT 12.5* 20.1* 22.1* 21.3* 20.3* 29.0* 20.8*  MCV 105.0* 98.5 96.9 95.5 94.9  --  94.1  PLT 78* 58* 40* 38* 31*  --  25*   BMP &GFR Recent Labs  Lab 12/18/21 1124 12/18/21 1751 12/19/21 0621 12/19/21 1030 12/20/21 0223 12/21/21 0244  NA 135 136  --  135 134* 134*  K 5.5* 5.1  --  4.5 3.9 4.3  CL 96* 100  --  102 103 104  CO2 17* 19*  --  21* 21* 24  GLUCOSE 145* 213*  --  208* 203* 148*  BUN 38* 42*  --  56* 63* 42*  CREATININE 1.70* 1.44*  --  1.70* 1.89* 1.11  CALCIUM 8.1* 7.5*  --  7.6* 7.8* 7.9*  MG  --   --  1.8  --  2.3 1.9  PHOS  --   --  4.7*  --   --  3.3   Estimated Creatinine Clearance: 72.7 mL/min (by C-G formula based on SCr of 1.11 mg/dL). Liver & Pancreas: Recent Labs  Lab 12/18/21 1124 12/18/21 1751 12/19/21 1030 12/20/21 0223 12/21/21 0244  AST 495* 2,284* 1,436* 678* 402*  ALT 107* 545* 627* 487* 330*  ALKPHOS 70 66 69 71 77  BILITOT 8.5* 8.8* 5.6* 3.9* 2.2*  PROT 7.4 6.1* 5.9* 5.7* 5.4*  ALBUMIN 3.5 2.9* 2.7* 2.5* 2.3*   Recent Labs  Lab 12/18/21 1124  LIPASE 30   Recent Labs  Lab 12/18/21 1222 12/21/21 0244  AMMONIA 52* 30   Diabetic: Recent Labs    12/20/21 0801  HGBA1C 4.9   Recent Labs  Lab 12/18/21 1544  GLUCAP 59*    Cardiac Enzymes: Recent Labs  Lab 12/20/21 0801 12/21/21 0244  CKTOTAL 4,231* 7,837*   No results for input(s): PROBNP in the last 8760 hours. Coagulation Profile: Recent Labs  Lab 12/18/21 1124 12/21/21 0244  INR 1.8* 1.4*   Thyroid Function Tests: No results for input(s): TSH, T4TOTAL, FREET4, T3FREE, THYROIDAB in the last 72 hours. Lipid Profile: No results for input(s): CHOL, HDL, LDLCALC, TRIG, CHOLHDL, LDLDIRECT in the last 72 hours. Anemia Panel: Recent Labs    12/18/21 1753 12/18/21 2207  VITAMINB12 3,195*  --   RETICCTPCT  --  4.2*   Urine analysis:    Component Value Date/Time   COLORURINE AMBER (A) 12/18/2021 1139   APPEARANCEUR CLEAR 12/18/2021 1139   LABSPEC 1.013 12/18/2021 1139   PHURINE 5.0 12/18/2021 1139   GLUCOSEU NEGATIVE 12/18/2021 1139   GLUCOSEU NEGATIVE 01/06/2015 0912   HGBUR LARGE (A) 12/18/2021 1139   BILIRUBINUR NEGATIVE 12/18/2021 1139   KETONESUR 5 (A) 12/18/2021 1139   PROTEINUR 100 (A) 12/18/2021 1139   UROBILINOGEN 1.0 01/06/2015 0912   NITRITE NEGATIVE 12/18/2021 1139   LEUKOCYTESUR NEGATIVE 12/18/2021 1139   Sepsis Labs: Invalid input(s): PROCALCITONIN, Sac  Microbiology: Recent Results (from the past 240 hour(s))  Blood Culture (routine x 2)     Status: None (Preliminary result)   Collection Time: 12/18/21 11:24 AM   Specimen: BLOOD  Result Value Ref Range Status   Specimen Description   Final    BLOOD BLOOD RIGHT HAND Performed at [facility] [address], [place], [place] [address]    Special Requests   Final    BOTTLES DRAWN AEROBIC AND ANAEROBIC Blood Culture adequate volume Performed at [facility] [address] [place], [place] [address]    Culture  Final    NO GROWTH 3 DAYS Performed at [facility], [place] [address] [place], [place] [address]    Report Status PENDING  Incomplete  Blood Culture (routine x 2)     Status: None (Preliminary result)    Collection Time: 12/18/21 11:32 AM   Specimen: BLOOD  Result Value Ref Range Status   Specimen Description   Final    BLOOD RIGHT ANTECUBITAL Performed at [facility] [address] [place], [place] [address]    Special Requests   Final    BOTTLES DRAWN AEROBIC AND ANAEROBIC Blood Culture results may not be optimal due to an excessive volume of blood received in culture bottles Performed at [facility] [address], [place], [place] [address]    Culture   Final    NO GROWTH 3 DAYS Performed at [facility], [place] [address] [place], [place] [address]    Report Status PENDING  Incomplete  Urine Culture     Status: None   Collection Time: 12/18/21 11:39 AM   Specimen: In/Out Cath Urine  Result Value Ref Range Status   Specimen Description   Final    IN/OUT CATH URINE Performed at [facility] [address], [place], [place] [address]    Special Requests   Final    NONE Performed at [facility], [place] [address], [place], [place] [address]    Culture   Final    NO GROWTH Performed at [facility], [place] [address], [place], [place] [address]    Report Status 12/20/2021 FINAL  Final  Resp Panel by RT-PCR (Flu A&B, Covid) Nasopharyngeal Swab     Status: None   Collection Time: 12/18/21 12:22 PM   Specimen: Nasopharyngeal Swab; Nasopharyngeal(NP) swabs in vial transport medium  Result Value Ref Range Status   SARS Coronavirus 2 by RT PCR NEGATIVE NEGATIVE Final    Comment: (NOTE) SARS-CoV-2 target nucleic acids are NOT DETECTED.  The SARS-CoV-2 RNA is generally detectable in upper respiratory specimens during the acute phase of infection. The lowest concentration of SARS-CoV-2 viral copies this assay can detect is 138 copies/mL. A negative result does not preclude SARS-Cov-2 infection and should not be used as the sole basis for treatment or other patient management decisions. A  negative result may occur with  improper specimen collection/handling, submission of specimen other than nasopharyngeal swab, presence of viral mutation(s) within the areas targeted by this assay, and inadequate number of viral copies(<138 copies/mL). A negative result must be combined with clinical observations, patient history, and epidemiological information. The expected result is Negative.  Fact Sheet for Patients:  EntrepreneurPulse.com.au  Fact Sheet for Healthcare Providers:  IncredibleEmployment.be  This test is no t yet approved or cleared by the [place] FDA and  has been authorized for detection and/or diagnosis of SARS-CoV-2 by FDA under an Emergency Use Authorization (EUA). This EUA will remain  in effect (meaning this test can be used) for the duration of the COVID-19 declaration under Section 564(b)(1) of the Act, 21 U.S.C.section 360bbb-3(b)(1), unless the authorization is terminated  or revoked sooner.       Influenza A by PCR NEGATIVE NEGATIVE Final   Influenza B by PCR NEGATIVE NEGATIVE Final    Comment: (NOTE) The Xpert Xpress SARS-CoV-2/FLU/RSV plus assay is intended as an aid in the diagnosis of influenza from Nasopharyngeal swab specimens and should not be used as a sole basis for treatment.  Nasal washings and aspirates are unacceptable for Xpert Xpress SARS-CoV-2/FLU/RSV testing.  Fact Sheet for Patients: EntrepreneurPulse.com.au  Fact Sheet for Healthcare Providers: IncredibleEmployment.be  This test is not yet approved or cleared by the [place] FDA and has been authorized for detection and/or diagnosis of SARS-CoV-2 by FDA under an Emergency Use Authorization (EUA). This EUA will remain in effect (meaning this test can be used) for the duration of the COVID-19 declaration under Section 564(b)(1) of the Act, 21 U.S.C. section 360bbb-3(b)(1), unless the authorization  is terminated or revoked.  Performed at [facility], [place] [address], [place], [place] [address]   MRSA Next Gen by PCR, Nasal     Status: Abnormal   Collection Time: 12/20/21  9:40 AM   Specimen: Nasal Mucosa; Nasal Swab  Result Value Ref Range Status   MRSA by PCR Next Gen DETECTED (A) NOT DETECTED Final    Comment: (NOTE) The GeneXpert MRSA Assay (FDA approved for NASAL specimens only), is one component of a comprehensive MRSA colonization surveillance program. It is not intended to diagnose MRSA infection nor to guide or monitor treatment for MRSA infections. Test performance is not FDA approved in patients less than 9 years old. Performed at [facility], [place] [address], [place], [place] [address]     Radiology Studies: CT ABDOMEN PELVIS WO CONTRAST  Result Date: 12/20/2021 CLINICAL DATA:  Acute abdominal pain EXAM: CT ABDOMEN AND PELVIS WITHOUT CONTRAST TECHNIQUE: Multidetector CT imaging of the abdomen and pelvis was performed following the standard protocol without IV contrast. RADIATION DOSE REDUCTION: This exam was performed according to the departmental dose-optimization program which includes automated exposure control, adjustment of the mA and/or kV according to patient size and/or use of iterative reconstruction technique. COMPARISON:  MRI abdomen dated 12/20/2021. Right upper quadrant ultrasound dated 12/18/2021. FINDINGS: Lower chest: Mild left basilar atelectasis. Hepatobiliary: Macronodular hepatic contour raises the possibility of cirrhosis. No focal hepatic lesion on unenhanced CT. Cholelithiasis with gallbladder distension and pericholecystic inflammatory changes, reflecting acute cholecystitis. Secondary inflammatory changes along the serosal aspect of the colon at the hepatic flexure (series 2/image 36). Localized perforation with fistulous communication is suggested on MR but is poorly evaluated on unenhanced CT (series 2/image  35). No intrahepatic or extrahepatic ductal dilatation. Pancreas: Within normal limits. Spleen: Mildly enlarged, measuring 16.4 cm in maximal craniocaudal dimension. Adrenals/Urinary Tract: Adrenal glands are within normal limits. Kidneys are within normal limits. No renal, ureteral, or bladder calculi. No hydronephrosis. Bladder is mildly thick-walled although underdistended. Stomach/Bowel: Stomach is within normal limits. No evidence of bowel obstruction. Normal appendix (series 2/image 61). Serosal wall thickening/inflammatory changes along the hepatic flexure, as described above. Mild left colonic diverticulosis, without evidence of diverticulitis. Vascular/Lymphatic: No evidence of abdominal aortic aneurysm. Mild perigastric varices. Atherosclerotic calcifications of the abdominal aorta and branch vessels. No suspicious abdominopelvic lymphadenopathy. Reproductive: Prostate is unremarkable. Other: No abdominopelvic ascites. No free air. Musculoskeletal: Degenerative changes of the visualized thoracolumbar spine. Mild superior endplate changes at L2, likely chronic. IMPRESSION: Cholelithiasis with acute cholecystitis. Suspected localized perforation with secondary inflammatory changes involving the hepatic flexure of the colon, better evaluated on recent MRI. Surgical consultation is suggested. Secondary inflammatory changes along the serosal aspect of the colon at the hepatic flexure. Localized perforation with fistulous communication is suggested on MR but is poorly evaluated on unenhanced CT. Suspected cirrhosis. No focal hepatic lesion on unenhanced CT. Mild splenomegaly and perigastric varices. These results will be called to the ordering clinician or representative  by the Radiologist Assistant, and communication documented in the PACS or [employer]. Electronically Signed   By: Julian Hy M.D.   On: 12/20/2021 17:16      Jami Ohlin T. Henry  If 7PM-7AM, please contact  night-coverage [email or web address] 12/21/2021, 11:26 AM

## 2021-12-21 NOTE — Progress Notes (Signed)
Physical Therapy Treatment Patient Details Name: [name]. MRN: [id number] DOB: May 21, 1951 Today's Date: 12/21/2021   History of Present Illness 71 year-old M with PMH of EtOH cirrhosis, nonimmune hemolytic anemia, pancytopenia, right AKA and hepatitis C brought to ED 12/18/21 with altered mental status after he slid off his bed and found down on the ground.. Patient  admitted to ICU with hypovolemic shock, hemolytic anemia, liver failure, encephalopathy, AKI and hyperkalemia.    PT Comments    Patient reports feeling better today.Assisted patient with  rolling , sitting upright in bed. Unable to safely scoot patient to recliner.  Will need a  maximove or maxisky. RN aware.  Patient with noted scrotal  and left leg edema. Discoloration of lower leg and bruising appearance on dorsal foot.  Patient lacks PROM/knee extension with noted contracture of ~ 45 *- patient reports present PTA. Patient  does demonstrate impaired sensation of the  left leg: Medial thigh, entire lower leg and foot. Did not feel strong pinch in lower leg. Patient reports LT in lateral thigh from knee caudally.  Patient lacks active Left dorsi and plantar flexion as well. Patient reports  this is new to him. RN aware and messaged MD about sensory and motor changes/loss, different than PTA per patient.  Left leg strength: 1+/5 quad strength- can palpate and visualize quad activity.  Hip flex 2/5, hip ext 2+, Hip IR 1+/5 HR 80-120's with activity  Continue PT for mobility.  Patient reports that he may have been down for 14 hours before being found. There are  no LLE xrays         Recommendations for follow up therapy are one component of a multi-disciplinary discharge planning process, led by the attending physician.  Recommendations may be updated based on patient status, additional functional criteria and insurance authorization.  Follow Up Recommendations  Skilled nursing-short term rehab (<3 hours/day)      Assistance Recommended at Discharge Frequent or constant Supervision/Assistance  Patient can return home with the following Two people to help with walking and/or transfers;Assist for transportation;Help with stairs or ramp for entrance;A little help with bathing/dressing/bathroom   Equipment Recommendations  None recommended by PT    Recommendations for Other Services       Precautions / Restrictions Precautions Precautions: Fall Precaution Comments: R AKA, profound  weakness  left  hip, knee, demonstrates no ankle dorsi/plantar flex,  lacks P/AROM   Knee extension , contracted, per patient , contracture PTA(WC dep)also Restrictions Other Position/Activity Restrictions: will not be able to stand on Left leg     Mobility  Bed Mobility Overal bed mobility: Needs Assistance Bed Mobility: Rolling, Supine to Sit, Sit to Supine Rolling: Mod assist, +2 for safety/equipment, +2 for physical assistance   Supine to sit: Min assist Sit to supine: Mod assist, +2 for safety/equipment, +2 for physical assistance   General bed mobility comments: multimodal cues to reach for rails to facilitate rolling, Patien able to pull self up into  long sitting , max assist to place the left leg onto bed when returns to supine    Transfers Overall transfer level: Needs assistance   Transfers: Bed to chair/wheelchair/BSC            Lateral/Scoot Transfers: Total assist, +2 physical assistance, +2 safety/equipment General transfer comment: attempted to scoot  to recliner leading with right side, Unable to slide into recliner, max weight shifted to right AKA side, not able to safely slide patient, patient using UE's maximally  Ambulation/Gait                   Stairs             Wheelchair Mobility    Modified Rankin (Stroke Patients Only)       Balance Overall balance assessment: Needs assistance, History of Falls Sitting-balance support: Bilateral upper extremity  supported, Feet unsupported Sitting balance-Leahy Scale: Poor Sitting balance - Comments: mostly balances when sitting upright, intermittently will quickly lean forward or backward, requires close guard and support                                    Cognition Arousal/Alertness: Awake/alert Behavior During Therapy: WFL for tasks assessed/performed Overall Cognitive Status: Within Functional Limits for tasks assessed                                 General Comments: cooperative and motivated to mobilize        Exercises      General Comments        Pertinent Vitals/Pain Pain Assessment Faces Pain Scale: Hurts whole lot Pain Location: left hip with internal rotation, positions leg in ER in bed with knee flexed Pain Descriptors / Indicators: Discomfort, Aching, Guarding Pain Intervention(s): Monitored during session, Premedicated before session, Repositioned    Home Living Family/patient expects to be discharged to:: Skilled nursing facility Living Arrangements: Alone Available Help at Discharge: Personal care attendant (3 hrs M-F) Type of Home: Apartment Home Access: Level entry       Home Layout: One level Home Equipment: Wheelchair - Press photographer      Prior Function            PT Goals (current goals can now be found in the care plan section) Progress towards PT goals: Progressing toward goals    Frequency    Min 2X/week      PT Plan Current plan remains appropriate    Co-evaluation PT/OT/SLP Co-Evaluation/Treatment: Yes Reason for Co-Treatment: Complexity of the patient's impairments (multi-system involvement);For patient/therapist safety;To address functional/ADL transfers PT goals addressed during session: Mobility/safety with mobility OT goals addressed during session: ADL's and self-care      AM-PAC PT "6 Clicks" Mobility   Outcome Measure  Help needed turning from your back to your side while in a flat  bed without using bedrails?: A Lot Help needed moving from lying on your back to sitting on the side of a flat bed without using bedrails?: A Lot Help needed moving to and from a bed to a chair (including a wheelchair)?: Total Help needed standing up from a chair using your arms (e.g., wheelchair or bedside chair)?: Total Help needed to walk in hospital room?: Total Help needed climbing 3-5 steps with a railing? : Total 6 Click Score: 8    End of Session Equipment Utilized During Treatment: Gait belt Activity Tolerance: Patient tolerated treatment well Patient left: in bed;with call bell/phone within reach;with bed alarm set;with nursing/sitter in room;with family/visitor present Nurse Communication: Need for lift equipment;Mobility status PT Visit Diagnosis: Muscle weakness (generalized) (M62.81);History of falling (Z91.81)     Time: 0820-0908 PT Time Calculation (min) (ACUTE ONLY): 48 min  Charges:  $Therapeutic Activity: 23-37 mins                     Tresa Endo PT  Acute Rehabilitation Services Pager [phone] Office [phone]    Claretha Cooper 12/21/2021, 9:26 AM

## 2021-12-21 NOTE — Progress Notes (Signed)
Updated patient's son over the phone and confirmed full CODE STATUS.   Additionally, ABI with severe PAD.  Unfortunately, not a candidate for statin, ASA or other antiplatelet at this point due to liver failure and worsening thrombocytopenia. Vascular surgery consult inpatient or outpatient once stabilized.  Meanwhile, will monitor closely.

## 2021-12-21 NOTE — Assessment & Plan Note (Addendum)
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  ABI with severe PAD which might be contributing. -Consider vascular consult once stable  -Closely monitor

## 2021-12-21 NOTE — Progress Notes (Signed)
ABI has been completed.  Preliminary findings given to Dr. Cyndia Skeeters via secure chat.  Results can be found under chart review under CV PROC. 12/21/2021 12:29 PM Katrece Roediger RVT, RDMS

## 2021-12-21 NOTE — Plan of Care (Signed)
  Problem: Education: Goal: Knowledge of General Education information will improve Description: Including pain rating scale, medication(s)/side effects and non-pharmacologic comfort measures Outcome: Progressing   Problem: Safety: Goal: Ability to remain free from injury will improve Outcome: Progressing   

## 2021-12-21 NOTE — Plan of Care (Signed)

## 2021-12-21 NOTE — Assessment & Plan Note (Addendum)
-  Likely demand ischemia in the setting of sepsis and shock most likely in the setting of rhabdomyolysis -Denies any current chest pain

## 2021-12-21 NOTE — Evaluation (Signed)
Occupational Therapy Evaluation Patient Details Name: [name]. MRN: [id number] DOB: Sep 24, 1950 Today's Date: 12/21/2021   History of Present Illness 71 year-old M with PMH of EtOH cirrhosis, nonimmune hemolytic anemia, pancytopenia, right AKA and hepatitis C brought to ED 12/18/21 with altered mental status after he slid off his bed and found down on the ground.. Patient  admitted to ICU with hypovolemic shock, hemolytic anemia, liver failure, encephalopathy, AKI and hyperkalemia.   Clinical Impression   [name] is a 71 year old man with above medical history and he presents with generalized weakness, decreased activity tolerance, impaired balance, pain and decreased range of motion, strength and sensation in LLE. At baseline he is typically able to pivot transfer to wheelchair and perform ADLS grossly. 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 Today he was able to sit up in bed and scoot partially to edge of bed. He needed assistance via bed pad to square up to edge of bed and then +2 assistance to attempt to scoot in to recliner but ultimately was unsuccessful. He needs +2 assistance to return to supine and roll in bed. He required max assist for LB ADLs and total assist for toileting at bed level. Patient will benefit from skilled OT services while in hospital to improve deficits and learn compensatory strategies as needed in order to return to PLOF.  Recommend short term rehab at discharge.      Recommendations for follow up therapy are one component of a multi-disciplinary discharge planning process, led by the attending physician.  Recommendations may be updated based on patient status, additional functional criteria and insurance authorization.   Follow Up Recommendations  Skilled nursing-short term rehab (<3 hours/day)    Assistance Recommended at Discharge Frequent or constant Supervision/Assistance  Patient can return home with the following Two people to help with  walking and/or transfers;A lot of help with bathing/dressing/bathroom;Assistance with cooking/housework;Help with stairs or ramp for entrance;Direct supervision/assist for medications management;Direct supervision/assist for financial management;Assist for transportation    Functional Status Assessment  Patient has had a recent decline in their functional status and demonstrates the ability to make significant improvements in function in a reasonable and predictable amount of time.  Equipment Recommendations  None recommended by OT    Recommendations for Other Services       Precautions / Restrictions Precautions Precautions: Fall Precaution Comments: R AKA, profound  weakness  left  hip, knee, demonstrates no ankle dorsi/plantar flex,  lacks P/AROM   Knee extension , contracted, per patient , contracture PTA(WC dep)also Restrictions Other Position/Activity Restrictions: will not be able to stand on Left leg      Mobility Bed Mobility Overal bed mobility: Needs Assistance Bed Mobility: Rolling, Supine to Sit, Sit to Supine Rolling: Mod assist, +2 for safety/equipment, +2 for physical assistance   Supine to sit: Min assist Sit to supine: Mod assist, +2 for safety/equipment, +2 for physical assistance   General bed mobility comments: multimodal cues to reach for rails to facilitate rolling, Patien able to pull self up into  long sitting , max assist to place the left leg onto bed when returns to supine, patient losing balance anteriorly    Transfers Overall transfer level: Needs assistance   Transfers: Bed to chair/wheelchair/BSC            Lateral/Scoot Transfers: Total assist, +2 physical assistance, +2 safety/equipment General transfer comment: attempted to scoot  to recliner leading with right side, Unable to slide into recliner, max  weight shifted to right AKA side, not able to safely slide patient, patient using UE's maximally      Balance Overall balance assessment:  Needs assistance, History of Falls Sitting-balance support: Bilateral upper extremity supported, Feet unsupported Sitting balance-Leahy Scale: Poor Sitting balance - Comments: mostly balances when sitting upright, intermittently will quickly lean forward or backward, requires close guard and support                                   ADL either performed or assessed with clinical judgement   ADL Overall ADL's : Needs assistance/impaired Eating/Feeding: Set up;Bed level   Grooming: Set up;Bed level   Upper Body Bathing: Set up;Bed level   Lower Body Bathing: Maximal assistance;Bed level   Upper Body Dressing : Set up;Bed level   Lower Body Dressing: Maximal assistance;Bed level   Toilet Transfer: Total assistance Toilet Transfer Details (indicate cue type and reason): hoyer required Toileting- Clothing Manipulation and Hygiene: Total assistance;Bed level               Vision Patient Visual Report: No change from baseline       Perception     Praxis      Pertinent Vitals/Pain Pain Assessment Pain Assessment: Faces Faces Pain Scale: Hurts whole lot Pain Location: LLE Pain Descriptors / Indicators: Discomfort, Aching, Guarding Pain Intervention(s): Monitored during session, Patient requesting pain meds-RN notified     Hand Dominance Right   Extremity/Trunk Assessment Upper Extremity Assessment Upper Extremity Assessment: RUE deficits/detail;LUE deficits/detail RUE Deficits / Details: Shoulder 4/5, bicep 5/5, tricep 5/5, wrist 5/5, grip 4/5 RUE Sensation: WNL RUE Coordination: WNL LUE Deficits / Details: Shoulder 5/5, bicep 5/5, tricep 5/5, wrist 5/5, grip 4/5 LUE Sensation: WNL LUE Coordination: WNL   Lower Extremity Assessment Lower Extremity Assessment: Defer to PT evaluation   Cervical / Trunk Assessment Cervical / Trunk Assessment: Normal   Communication Communication Communication: No difficulties   Cognition Arousal/Alertness:  Awake/alert Behavior During Therapy: WFL for tasks assessed/performed Overall Cognitive Status: Within Functional Limits for tasks assessed                                       General Comments       Exercises     Shoulder Instructions      Home Living Family/patient expects to be discharged to:: Skilled nursing facility Living Arrangements: Alone Available Help at Discharge: Personal care attendant (3 hrs M-F) Type of Home: Apartment Home Access: Level entry     Home Layout: One level               Home Equipment: Wheelchair - Press photographer          Prior Functioning/Environment Prior Level of Function : Needs assist             Mobility Comments: Mod I for  transfers only, ADLs Comments: grossly independent with ADLs - but difficulty the last three weeks        OT Problem List: Decreased strength;Decreased range of motion;Decreased activity tolerance;Impaired balance (sitting and/or standing);Decreased knowledge of use of DME or AE;Pain;Cardiopulmonary status limiting activity;Obesity;Increased edema;Impaired sensation      OT Treatment/Interventions: Self-care/ADL training;Therapeutic exercise;Neuromuscular education;DME and/or AE instruction;Therapeutic activities;Balance training;Patient/family education    OT Goals(Current goals can be found in the care plan section) Acute Rehab  OT Goals Patient Stated Goal: to get stronger OT Goal Formulation: With patient Time For Goal Achievement: 01/04/22 Potential to Achieve Goals: Good  OT Frequency: Min 2X/week    Co-evaluation PT/OT/SLP Co-Evaluation/Treatment: Yes (eval) Reason for Co-Treatment: For patient/therapist safety;To address functional/ADL transfers PT goals addressed during session: Mobility/safety with mobility OT goals addressed during session: ADL's and self-care      AM-PAC OT "6 Clicks" Daily Activity     Outcome Measure Help from another person eating  meals?: A Little Help from another person taking care of personal grooming?: A Little Help from another person toileting, which includes using toliet, bedpan, or urinal?: Total Help from another person bathing (including washing, rinsing, drying)?: A Lot Help from another person to put on and taking off regular upper body clothing?: A Little Help from another person to put on and taking off regular lower body clothing?: A Lot 6 Click Score: 14   End of Session Equipment Utilized During Treatment: Gait belt Nurse Communication: Mobility status  Activity Tolerance: Patient limited by pain Patient left: in bed;with call bell/phone within reach;with family/visitor present  OT Visit Diagnosis: Muscle weakness (generalized) (M62.81);Other abnormalities of gait and mobility (R26.89);Pain Pain - Right/Left: Left Pain - part of body: Hip;Knee                Time: 0820-0904 OT Time Calculation (min): 44 min Charges:  OT General Charges $OT Visit: 1 Visit OT Evaluation $OT Eval Moderate Complexity: 1 Mod  Tyhir Schwan, OTR/L [place]  Office [phone] Pager: [place] 12/21/2021, 10:35 AM

## 2021-12-21 NOTE — Progress Notes (Signed)
Speech Language Pathology Treatment: Dysphagia  Patient Details Name: [name]. MRN: [id number] DOB: 03/29/1951 Today's Date: 12/21/2021 Time: 1540-0867 SLP Time Calculation (min) (ACUTE ONLY): 20 min  Assessment / Plan / Recommendation Clinical Impression  Cough x2 with thin liquids - once after 3 ounce [place]- no further episodes; pt able to easily masticate and orally transit solid with peanutbutter.  He does admit to getting "choked" on solids/meats a few weeks ago - thus SLP advised against him ordering porkchops, grilled chicken breast -using teach back. He was agreeable to plan.  Mentation much improved today compared to 2 days prior - Recommend advance diet to regular/thin.  Note slight facial asymmetry - which son did not observe after SLP brought his attention to it. ? if pt has slight facial asymmetry at baseline - and his head is tilted to the right.  All goals met, education completed, diet advanced and swallow clinically judged to be at baseline.   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 Pt required pressors, now is off and wants to eat per notes. Swallow eval ordered. Pt started on a dys3/thin diet 2 days ago - per RN, intake improved today.  SLP follow up regarding dysphagia, po tolerance, readiness for dietary advancement.      SLP Plan  All goals met      Recommendations for follow up therapy are one component of a multi-disciplinary discharge planning process, led by the attending physician.  Recommendations may be updated based on patient status, additional functional criteria and insurance authorization.    Recommendations  Diet recommendations: Regular;Thin  liquid Liquids provided via: Cup;Straw Medication Administration: Other (Comment) (one at a tme with liquids, use puree if problematic) Compensations: Minimize environmental distractions;Slow rate;Small sips/bites Postural Changes and/or Swallow Maneuvers: Seated upright 90 degrees;Upright 30-60 min after meal                Oral Care Recommendations: Oral care BID Follow Up Recommendations: No SLP follow up Assistance recommended at discharge: Frequent or constant Supervision/Assistance SLP Visit Diagnosis: Dysphagia, oral phase (R13.11) Plan: All goals met         Kathleen Lime, MS [facility] SLP Acute Rehab Services Office [phone] Pager [phone]   [name]  12/21/2021, 4:14 PM

## 2021-12-21 NOTE — Progress Notes (Signed)
Subjective: Patient very talkative.  Eating breakfast with no issues.  Denies abdominal pain overall.  No nausea or vomiting.  ROS: See above, otherwise other systems negative  Objective: Vital signs in last 24 hours: Temp:  [97.6 F (36.4 C)-98.9 F (37.2 C)] 97.6 F (36.4 C) (05/25 0403) Pulse Rate:  [72-89] 72 (05/25 0600) Resp:  [12-23] 13 (05/25 0600) BP: (114-159)/(42-62) 127/51 (05/25 0600) SpO2:  [94 %-100 %] 98 % (05/25 0600) Weight:  [97.1 kg] 97.1 kg (05/25 0500) Last BM Date : 12/20/21  Intake/Output from previous day: 05/24 0701 - 05/25 0700 In: 1265.2 [I.V.:1081.3; IV Piggyback:184] Out: 5027 [Urine:1450] Intake/Output this shift: Total I/O In: 100 [IV Piggyback:100] Out: -   PE: Gen: NAD, eating breakfast Abd: soft, NT, ND, +BS  Lab Results:  Recent Labs    12/20/21 0223 12/20/21 0801 12/21/21 0244  WBC 8.6  --  5.7  HGB 6.7* 9.6* 7.1*  HCT 20.3* 29.0* 20.8*  PLT 31*  --  25*   BMET Recent Labs    12/20/21 0223 12/21/21 0244  NA 134* 134*  K 3.9 4.3  CL 103 104  CO2 21* 24  GLUCOSE 203* 148*  BUN 63* 42*  CREATININE 1.89* 1.11  CALCIUM 7.8* 7.9*   PT/INR Recent Labs    12/18/21 1124 12/21/21 0244  LABPROT 20.9* 17.3*  INR 1.8* 1.4*   CMP     Component Value Date/Time   NA 134 (L) 12/21/2021 0244   K 4.3 12/21/2021 0244   CL 104 12/21/2021 0244   CO2 24 12/21/2021 0244   GLUCOSE 148 (H) 12/21/2021 0244   BUN 42 (H) 12/21/2021 0244   CREATININE 1.11 12/21/2021 0244   CALCIUM 7.9 (L) 12/21/2021 0244   CALCIUM 9.8 02/26/2012 1428   PROT 5.4 (L) 12/21/2021 0244   ALBUMIN 2.3 (L) 12/21/2021 0244   AST 402 (H) 12/21/2021 0244   ALT 330 (H) 12/21/2021 0244   ALKPHOS 77 12/21/2021 0244   BILITOT 2.2 (H) 12/21/2021 0244   GFRNONAA >60 12/21/2021 0244   GFRAA >60 04/06/2020 1507   Lipase     Component Value Date/Time   LIPASE 30 12/18/2021 1124       Studies/Results: CT ABDOMEN PELVIS WO CONTRAST  Result  Date: 12/20/2021 CLINICAL DATA:  Acute abdominal pain EXAM: CT ABDOMEN AND PELVIS WITHOUT CONTRAST TECHNIQUE: Multidetector CT imaging of the abdomen and pelvis was performed following the standard protocol without IV contrast. RADIATION DOSE REDUCTION: This exam was performed according to the departmental dose-optimization program which includes automated exposure control, adjustment of the mA and/or kV according to patient size and/or use of iterative reconstruction technique. COMPARISON:  MRI abdomen dated 12/20/2021. Right upper quadrant ultrasound dated 12/18/2021. FINDINGS: Lower chest: Mild left basilar atelectasis. Hepatobiliary: Macronodular hepatic contour raises the possibility of cirrhosis. No focal hepatic lesion on unenhanced CT. Cholelithiasis with gallbladder distension and pericholecystic inflammatory changes, reflecting acute cholecystitis. Secondary inflammatory changes along the serosal aspect of the colon at the hepatic flexure (series 2/image 36). Localized perforation with fistulous communication is suggested on MR but is poorly evaluated on unenhanced CT (series 2/image 35). No intrahepatic or extrahepatic ductal dilatation. Pancreas: Within normal limits. Spleen: Mildly enlarged, measuring 16.4 cm in maximal craniocaudal dimension. Adrenals/Urinary Tract: Adrenal glands are within normal limits. Kidneys are within normal limits. No renal, ureteral, or bladder calculi. No hydronephrosis. Bladder is mildly thick-walled although underdistended. Stomach/Bowel: Stomach is within normal limits. No evidence of bowel obstruction. Normal  appendix (series 2/image 61). Serosal wall thickening/inflammatory changes along the hepatic flexure, as described above. Mild left colonic diverticulosis, without evidence of diverticulitis. Vascular/Lymphatic: No evidence of abdominal aortic aneurysm. Mild perigastric varices. Atherosclerotic calcifications of the abdominal aorta and branch vessels. No suspicious  abdominopelvic lymphadenopathy. Reproductive: Prostate is unremarkable. Other: No abdominopelvic ascites. No free air. Musculoskeletal: Degenerative changes of the visualized thoracolumbar spine. Mild superior endplate changes at L2, likely chronic. IMPRESSION: Cholelithiasis with acute cholecystitis. Suspected localized perforation with secondary inflammatory changes involving the hepatic flexure of the colon, better evaluated on recent MRI. Surgical consultation is suggested. Secondary inflammatory changes along the serosal aspect of the colon at the hepatic flexure. Localized perforation with fistulous communication is suggested on MR but is poorly evaluated on unenhanced CT. Suspected cirrhosis. No focal hepatic lesion on unenhanced CT. Mild splenomegaly and perigastric varices. These results will be called to the ordering clinician or representative by the Radiologist Assistant, and communication documented in the PACS or [employer]. Electronically Signed   By: Julian Hy M.D.   On: 12/20/2021 17:16   MR LIVER WO CONRTAST  Result Date: 12/20/2021 CLINICAL DATA:  Evaluate for hepatocellular carcinoma. EXAM: MRI ABDOMEN WITHOUT CONTRAST TECHNIQUE: Multiplanar multisequence MR imaging was performed without the administration of intravenous contrast. COMPARISON:  Abdominal sonogram 12/18/2021 FINDINGS: Lower chest: Atelectasis versus airspace disease noted within the left base. Hepatobiliary: Diffuse low signal throughout the liver on the in-phase sequence which increases in signal intensity on the out of phase sequences suggest iron deposition within the liver. There is hypertrophy of the lateral segment of left hepatic lobe as well as the caudate lobe. Contour the liver has a macro nodular appearance compatible with cirrhosis. Evaluation for underlying mass lesion is significantly limited due to lack of IV contrast material and respiratory motion artifact. Furthermore, patient was unable to  breath hold and requested early termination of the exam. Within this limitation no large liver mass identified. Multiple stones are identified within the gallbladder. These all measure around 4 mm. The gallbladder wall appears diffusely edematous with surrounding inflammatory fat stranding. There is a focal fluid collection which extends beyond the normal margins of the anterior wall of the gallbladder abutting the hepatic flexure, image 25/4 and image 14/6. As mentioned above examination is significantly limited reflecting motion artifact and lack of IV contrast material. No signs of common bile duct or intrahepatic bile duct dilatation. Pancreas:  No acute abnormality. Spleen:  The spleen is enlarged measuring 16.9 cm cranial caudal. Adrenals/Urinary Tract: Normal appearance of the adrenal glands. No kidney mass or signs of hydronephrosis. Stomach/Bowel: No abnormal bowel dilatation identified. Vascular/Lymphatic: No pathologically enlarged lymph nodes identified. No abdominal aortic aneurysm demonstrated. Other: There is free fluid identified within the upper abdomen extending over the liver dome. Musculoskeletal: Asymmetric increased signal intensity is identified localizing to the visualized portions of the left gluteus musculature, image 46/4. IMPRESSION: 1. Examination is limited due to lack of IV contrast material and respiratory motion artifact. Within this limitation no large liver mass identified. However study is not diagnostic for excluding 8 cc in a patient who is at increased risk. 2. Morphologic features of the liver compatible with cirrhosis with stigmata of portal venous hypertension including splenomegaly and ascites. 3. Gallstones with diffuse gallbladder wall thickening and surrounding inflammatory fat stranding. A small volume of free fluid is identified extending over the dome of liver. There is a focal fluid collection which extends beyond the normal margins of the anterior wall of the  gallbladder abutting the hepatic flexure. This may represent a small abscess or focal perforation of the gallbladder. Cholecystocolonic fistula is also a potential consideration. Further evaluation with CT of the abdomen pelvis is recommended. 4. Asymmetric increased signal intensity identified localizing to the visualized portions of the left gluteus musculature. This is only partially visualized and is of uncertain significance. Attention on follow-up CT is advised. 5. Critical Value/emergent results were called by telephone at the time of interpretation on 12/20/2021 at 11:45 am to provider [facility] , who verbally acknowledged these results. Electronically Signed   By: Kerby Moors M.D.   On: 12/20/2021 11:46   ECHOCARDIOGRAM COMPLETE  Result Date: 12/19/2021    ECHOCARDIOGRAM REPORT   Patient Name:   [name]. Date of Exam: 12/19/2021 Medical Rec #:  [id number]            Height:       72.0 in Accession #:    [id number]           Weight:       196.2 lb Date of Birth:  02-Mar-1951            BSA:          2.113 m Patient Age:    71 years             BP:           137/76 mmHg Patient Gender: M                    HR:           89 bpm. Exam Location:  Inpatient Procedure: 2D Echo, Cardiac Doppler and Color Doppler Indications:    Elevated troponin  History:        Patient has prior history of Echocardiogram examinations, most                 recent 04/09/2018. Risk Factors:Hypertension.  Sonographer:    Jefferey Pica Referring Phys: [id number] [place]  1. Left ventricular ejection fraction, by estimation, is 55 to 60%. The left ventricle has normal function. The left ventricle demonstrates regional wall motion abnormalities (basal septal hypokinesis). There is moderate concentric left ventricular hypertrophy. Left ventricular diastolic parameters are consistent with Grade I diastolic dysfunction (impaired relaxation). Qualitatively LV appears slightly underfilled.  2. Right  ventricular systolic function is normal. The right ventricular size is normal. Tricuspid regurgitation signal is inadequate for assessing PA pressure.  3. The mitral valve is normal in structure. No evidence of mitral valve regurgitation. No evidence of mitral stenosis.  4. The aortic valve was not well visualized. There is mild calcification of the aortic valve. Aortic valve regurgitation is not visualized.  5. The inferior vena cava is normal in size with greater than 50% respiratory variability, suggesting right atrial pressure of 3 mmHg. Comparison(s): Prior images reviewed side by side. Wall motion abnormalities are new from prior: prior study was not performed off-axis. LV size was normal in prior study. FINDINGS  Left Ventricle: Left ventricular ejection fraction, by estimation, is 55 to 60%. The left ventricle has normal function. The left ventricle demonstrates regional wall motion abnormalities. The left ventricular internal cavity size was normal in size. There is moderate concentric left ventricular hypertrophy. Left ventricular diastolic parameters are consistent with Grade I diastolic dysfunction (impaired relaxation).  LV Wall Scoring: The basal anteroseptal segment, mid inferoseptal segment, and basal inferoseptal segment are hypokinetic. Right Ventricle: The right ventricular  size is normal. No increase in right ventricular wall thickness. Right ventricular systolic function is normal. Tricuspid regurgitation signal is inadequate for assessing PA pressure. Left Atrium: Left atrial size was normal in size. Right Atrium: Right atrial size was normal in size. Pericardium: Trivial pericardial effusion is present. Presence of epicardial fat layer. Mitral Valve: The mitral valve is normal in structure. No evidence of mitral valve regurgitation. No evidence of mitral valve stenosis. Tricuspid Valve: The tricuspid valve is normal in structure. Tricuspid valve regurgitation is not demonstrated. No evidence  of tricuspid stenosis. Aortic Valve: The aortic valve was not well visualized. There is mild calcification of the aortic valve. Aortic valve regurgitation is not visualized. Aortic valve peak gradient measures 8.5 mmHg. Pulmonic Valve: The pulmonic valve was not well visualized. Pulmonic valve regurgitation is not visualized. Aorta: The aortic root and ascending aorta are structurally normal, with no evidence of dilitation. Venous: The inferior vena cava is normal in size with greater than 50% respiratory variability, suggesting right atrial pressure of 3 mmHg. IAS/Shunts: No atrial level shunt detected by color flow Doppler.  LEFT VENTRICLE PLAX 2D LVIDd:         5.10 cm LVIDs:         3.30 cm LV PW:         1.50 cm LV IVS:        1.50 cm LVOT diam:     2.10 cm LV SV:         51 LV SV Index:   24 LVOT Area:     3.46 cm  IVC IVC diam: 1.80 cm LEFT ATRIUM           Index        RIGHT ATRIUM           Index LA diam:      3.30 cm 1.56 cm/m   RA Area:     16.60 cm LA Vol (A4C): 57.0 ml 26.97 ml/m  RA Volume:   48.90 ml  23.14 ml/m  AORTIC VALVE                 PULMONIC VALVE AV Area (Vmax): 2.28 cm     PV Vmax:       1.06 m/s AV Vmax:        146.00 cm/s  PV Peak grad:  4.5 mmHg AV Peak Grad:   8.5 mmHg LVOT Vmax:      96.00 cm/s LVOT Vmean:     55.600 cm/s LVOT VTI:       0.146 m  AORTA Ao Root diam: 3.70 cm Ao Asc diam:  3.40 cm MITRAL VALVE MV Area (PHT): 3.63 cm    SHUNTS MV Decel Time: 209 msec    Systemic VTI:  0.15 m MV E velocity: 44.60 cm/s  Systemic Diam: 2.10 cm MV A velocity: 56.60 cm/s MV E/A ratio:  0.79 Rudean Haskell MD Electronically signed by Rudean Haskell MD Signature Date/Time: 12/19/2021/8:46:06 AM    Final     Anti-infectives: Anti-infectives (From admission, onward)    Start     Dose/Rate Route Frequency Ordered Stop   12/20/21 1830  metroNIDAZOLE (FLAGYL) IVPB 500 mg        500 mg 100 mL/hr over 60 Minutes Intravenous Every 12 hours 12/20/21 1727     12/20/21 1100   cefTRIAXone (ROCEPHIN) 1 g in sodium chloride 0.9 % 100 mL IVPB        1 g 200 mL/hr over 30 Minutes  Intravenous Every 24 hours 12/20/21 1006     12/19/21 2000  vancomycin (VANCOREADY) IVPB 1250 mg/250 mL  Status:  Discontinued        1,250 mg 166.7 mL/hr over 90 Minutes Intravenous Every 24 hours 12/18/21 1946 12/19/21 1207   12/19/21 1100  ceFEPIme (MAXIPIME) 2 g in sodium chloride 0.9 % 100 mL IVPB  Status:  Discontinued        2 g 200 mL/hr over 30 Minutes Intravenous Every 12 hours 12/19/21 1038 12/20/21 1006   12/19/21 0600  ceFEPIme (MAXIPIME) 2 g in sodium chloride 0.9 % 100 mL IVPB  Status:  Discontinued        2 g 200 mL/hr over 30 Minutes Intravenous Every 12 hours 12/18/21 1943 12/19/21 1038   12/18/21 1845  vancomycin (VANCOREADY) IVPB 2000 mg/400 mL        2,000 mg 200 mL/hr over 120 Minutes Intravenous STAT 12/18/21 1828 12/18/21 2151   12/18/21 1830  ceFEPIme (MAXIPIME) 2 g in sodium chloride 0.9 % 100 mL IVPB        2 g 200 mL/hr over 30 Minutes Intravenous STAT 12/18/21 1818 12/18/21 1951        Assessment/Plan Cirrhosis, ? Cholecystitis on imaging, H/O hep C -clinically patient does not act like he has cholecystitis.  In setting of cirrhosis the gallbladder can appear abnormal due to ascites and changes of the liver. -case discussed with [facility] and she doubts this is cholecystitis as well.  If the patient truly has a fistula or connection between his gb and colon, he would need to follow up as an outpatient with a tertiary care facility where they have a hepatologist and the ability to care for someone with cirrhosis and this type of problem -cont abx therapy to treat conservatively.  If worsens may require a perc chole drain, but he is not a surgical candidate at this time by our team. -LFTs improving -cont current care  FEN - D3 diet VTE - on hold due to thrombocytopenia ID - Rocephin/Flagyl  Hypovolemic shock Encephalopathy AKI Decompensated liver  failure Anemia Thrombocytopenia - plts 25K today  I reviewed hospitalist notes, last 24 h vitals and pain scores, last 48 h intake and output, last 24 h labs and trends, and last 24 h imaging results.   LOS: 3 days    Henreitta Cea , [facility] 12/21/2021, 8:15 AM Please see Amion for pager number during day hours 7:00am-4:30pm or 7:00am -11:30am on weekends

## 2021-12-21 NOTE — Assessment & Plan Note (Addendum)
-  See shock

## 2021-12-22 ENCOUNTER — Inpatient Hospital Stay (HOSPITAL_COMMUNITY): Payer: Medicare Other

## 2021-12-22 DIAGNOSIS — N179 Acute kidney failure, unspecified: Secondary | ICD-10-CM | POA: Diagnosis not present

## 2021-12-22 DIAGNOSIS — K7031 Alcoholic cirrhosis of liver with ascites: Secondary | ICD-10-CM | POA: Diagnosis not present

## 2021-12-22 DIAGNOSIS — R571 Hypovolemic shock: Secondary | ICD-10-CM | POA: Diagnosis not present

## 2021-12-22 DIAGNOSIS — G9341 Metabolic encephalopathy: Secondary | ICD-10-CM | POA: Diagnosis not present

## 2021-12-22 DIAGNOSIS — I471 Supraventricular tachycardia: Secondary | ICD-10-CM

## 2021-12-22 LAB — COMPREHENSIVE METABOLIC PANEL
ALT: 239 U/L — ABNORMAL HIGH (ref 0–44)
AST: 426 U/L — ABNORMAL HIGH (ref 15–41)
Albumin: 1.9 g/dL — ABNORMAL LOW (ref 3.5–5.0)
Alkaline Phosphatase: 74 U/L (ref 38–126)
Anion gap: 6 (ref 5–15)
BUN: 22 mg/dL (ref 8–23)
CO2: 24 mmol/L (ref 22–32)
Calcium: 7.6 mg/dL — ABNORMAL LOW (ref 8.9–10.3)
Chloride: 103 mmol/L (ref 98–111)
Creatinine, Ser: 0.61 mg/dL (ref 0.61–1.24)
GFR, Estimated: >60 mL/min (ref >60)
Glucose, Bld: 164 mg/dL — ABNORMAL HIGH (ref 70–99)
Potassium: 3.8 mmol/L (ref 3.5–5.1)
Sodium: 133 mmol/L — ABNORMAL LOW (ref 135–145)
Total Bilirubin: 1.4 mg/dL — ABNORMAL HIGH (ref 0.3–1.2)
Total Protein: 4.9 g/dL — ABNORMAL LOW (ref 6.5–8.1)

## 2021-12-22 LAB — PREPARE RBC (CROSSMATCH)

## 2021-12-22 LAB — CBC
HCT: 18.4 % — ABNORMAL LOW (ref 39.0–52.0)
Hemoglobin: 6 g/dL — CL (ref 13.0–17.0)
MCH: 31.7 pg (ref 26.0–34.0)
MCHC: 32.6 g/dL (ref 30.0–36.0)
MCV: 97.4 fL (ref 80.0–100.0)
Platelets: 24 10*3/uL — CL (ref 150–400)
RBC: 1.89 MIL/uL — ABNORMAL LOW (ref 4.22–5.81)
RDW: 18.8 % — ABNORMAL HIGH (ref 11.5–15.5)
WBC: 4.1 10*3/uL (ref 4.0–10.5)
nRBC: 0.5 % — ABNORMAL HIGH (ref 0.0–0.2)

## 2021-12-22 LAB — HEMOGLOBIN AND HEMATOCRIT, BLOOD
HCT: 22.9 % — ABNORMAL LOW (ref 39.0–52.0)
Hemoglobin: 7.6 g/dL — ABNORMAL LOW (ref 13.0–17.0)

## 2021-12-22 LAB — PROTIME-INR
INR: 1.4 — ABNORMAL HIGH (ref 0.8–1.2)
Prothrombin Time: 16.6 seconds — ABNORMAL HIGH (ref 11.4–15.2)

## 2021-12-22 LAB — MAGNESIUM: Magnesium: 1.6 mg/dL — ABNORMAL LOW (ref 1.7–2.4)

## 2021-12-22 LAB — CK: Total CK: 10683 U/L — ABNORMAL HIGH (ref 49–397)

## 2021-12-22 LAB — OCCULT BLOOD X 1 CARD TO LAB, STOOL: Fecal Occult Bld: POSITIVE — AB

## 2021-12-22 LAB — AMMONIA: Ammonia: 20 umol/L (ref 9–35)

## 2021-12-22 LAB — PHOSPHORUS: Phosphorus: 2.6 mg/dL (ref 2.5–4.6)

## 2021-12-22 MED ORDER — IOHEXOL 9 MG/ML PO SOLN
ORAL | Status: AC
  Administered 2021-12-22: 500 mL
  Filled 2021-12-22: qty 1000

## 2021-12-22 MED ORDER — SODIUM CHLORIDE 0.9% IV SOLUTION: Freq: Once | INTRAVENOUS | Status: AC

## 2021-12-22 MED ORDER — MAGNESIUM SULFATE 2 GM/50ML IV SOLN
2.0000 g | Freq: Once | INTRAVENOUS | Status: AC
  Administered 2021-12-22: 2 g via INTRAVENOUS
  Filled 2021-12-22: qty 50

## 2021-12-22 MED ORDER — METOPROLOL TARTRATE 5 MG/5ML IV SOLN
5.0000 mg | Freq: Four times a day (QID) | INTRAVENOUS | Status: DC | PRN
  Administered 2021-12-22 – 2021-12-24 (×5): 5 mg via INTRAVENOUS
  Filled 2021-12-22 (×6): qty 5

## 2021-12-22 MED ORDER — IOHEXOL 300 MG/ML  SOLN
100.0000 mL | Freq: Once | INTRAMUSCULAR | Status: AC | PRN
  Administered 2021-12-22: 100 mL via INTRAVENOUS

## 2021-12-22 MED ORDER — CHLORHEXIDINE GLUCONATE CLOTH 2 % EX PADS
6.0000 | MEDICATED_PAD | Freq: Every day | CUTANEOUS | Status: DC
  Administered 2021-12-22 – 2021-12-27 (×6): 6 via TOPICAL

## 2021-12-22 MED ORDER — METRONIDAZOLE 500 MG/100ML IV SOLN
500.0000 mg | Freq: Two times a day (BID) | INTRAVENOUS | Status: DC
  Administered 2021-12-22 – 2021-12-25 (×7): 500 mg via INTRAVENOUS
  Filled 2021-12-22 (×7): qty 100

## 2021-12-22 MED ORDER — IOHEXOL 9 MG/ML PO SOLN
500.0000 mL | ORAL | Status: AC
  Administered 2021-12-22: 500 mL via ORAL

## 2021-12-22 MED ORDER — SODIUM CHLORIDE 0.9 % IV SOLN: INTRAVENOUS | Status: DC | PRN

## 2021-12-22 NOTE — NC FL2 (Signed)
[place] LEVEL OF CARE SCREENING TOOL     IDENTIFICATION  Patient Name: [name]. Birthdate: 30-Jan-1951 Sex: male Admission Date (Current Location): 12/18/2021  [place] and [place] Number:  [name] [id number] [place] and Address:  [facility],  [place], [place]      Provider Number: [id number]  Attending Physician Name and Address:  Mercy Riding, MD  Relative Name and Phone Number:  [name] Vos (son) Ph: [phone]    Current Level of Care: Hospital Recommended Level of Care: [place] Prior Approval Number:    Date Approved/Denied:   PASRR Number: [id number]  Discharge Plan: SNF    Current Diagnoses: Patient Active Problem List   Diagnosis Date Noted   Left leg weakness and numbness 12/21/2021   Calculus of bile duct with acute cholecystitis 12/21/2021   Elevated troponin 12/21/2021   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 14/97/0263   Thrombocytopenia ([place]) 12/20/2021   AKI (acute kidney injury) ([place]) 78/58/8502   Acute metabolic encephalopathy 77/41/2878   Elevated liver enzymes 12/20/2021   Hyperbilirubinemia 12/20/2021   Abnormal MRI, liver 12/20/2021   Traumatic rhabdomyolysis ([place]) 12/20/2021   Hyperkalemia and hyponatremia 12/20/2021   Hyperkalemia 12/20/2021   Hyponatremia 12/20/2021   Septic shock ([place]) 12/18/2021   Hypomagnesemia 12/16/2020   Hemolytic anemia (HCC)    Gastric nodule    Duodenal erythema    Polyp of colon    Left leg swelling 12/14/2020   Eye trauma 02/29/2020   History of pancytopenia 02/29/2020   Portal venous hypertension ([place]) 02/29/2020   S/P AKA (above knee amputation) unilateral ([place]) 02/29/2020   Hepatic cirrhosis due to chronic hepatitis C infection ([place]) 02/29/2020   Pharmacologic therapy 02/29/2020   Disorder of skeletal system 02/29/2020   Physical deconditioning 02/29/2020   HTN (hypertension) 09/14/2019    Hepatic encephalopathy ([place]) 04/07/2018   Osteoarthritis of left knee 02/26/2018   Chronic pain syndrome 11/23/2016   Hypothyroidism 09/25/2016   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   Hepatitis B core antibody positive 05/22/2016   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[place]) 04/14/2015   Cellulitis of right thigh 05/21/2012   Pancytopenia ([employer]) 02/26/2012   Encounter for long-term (current) use of other medications 02/26/2012   Screening for prostate cancer 02/26/2012   Anemia 02/26/2012   Closed fracture of carpal bone 10/12/2009   Dyslipidemia 09/12/2009   Phantom limb syndrome ([place]) 09/12/2009   Hypocalcemia 12/23/2007   ERECTILE DYSFUNCTION, 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   History of nephrolithiasis 12/23/2007   Anxiety 03/24/2007    Orientation RESPIRATION BLADDER Height & Weight     Self, Situation, Place  Normal Incontinent Weight: 100 kg Height:  6' (182.9 cm)  BEHAVIORAL SYMPTOMS/MOOD NEUROLOGICAL BOWEL NUTRITION STATUS   (N/A)   Incontinent Diet (Dysphagia 3 diet)  AMBULATORY STATUS COMMUNICATION OF NEEDS Skin   Total Care Verbally Other (Comment), Skin abrasions (Abrasion: left leg & left elbow; Ecchymosis: bilateral arms & legs, abdomen)                       Personal Care Assistance Level of Assistance  Bathing, Feeding, Dressing           Functional Limitations Info  Sight, Hearing, Speech Sight Info: Impaired Hearing Info: Adequate Speech Info: Adequate    SPECIAL CARE FACTORS FREQUENCY  PT (By licensed PT), OT (By licensed OT)     PT Frequency: 5x's/week OT Frequency: 5x's/week  Contractures Contractures Info: Not present    Additional Factors Info  Code Status, Allergies Code Status Info: Full Allergies Info: Clindamycin           Current Medications (12/22/2021):  This is the current hospital active medication list Current Facility-Administered Medications  Medication Dose Route Frequency Provider Last Rate Last Admin    (feeding supplement) PROSource Plus liquid 30 mL  30 mL Oral Daily Gonfa, Taye T, MD   30 mL at 12/21/21 2038   0.9 %  sodium chloride infusion   Intra-arterial PRN Zamorano, Mickel Baas R, PA-C       0.9 %  sodium chloride infusion   Intravenous Continuous Wendee Beavers T, MD 200 mL/hr at 12/22/21 0735 Rate Change at 12/22/21 0735   acetaminophen (TYLENOL) tablet 650 mg  650 mg Oral Q6H PRN Margaretha Seeds, MD   650 mg at 12/20/21 2232   cefTRIAXone (ROCEPHIN) 2 g in sodium chloride 0.9 % 100 mL IVPB  2 g Intravenous Q24H Adrian Saran, [facility]   Stopped at 12/21/21 1102   Chlorhexidine Gluconate Cloth 2 % PADS 6 each  6 each Topical Daily Freddi Starr, MD   6 each at 12/21/21 1030   docusate (COLACE) 50 MG/5ML liquid 100 mg  100 mg Oral BID PRN Freddi Starr, MD       feeding supplement (BOOST / RESOURCE BREEZE) liquid 1 Container  1 Container Oral BID BM Mercy Riding, MD   1 Container at 12/21/21 1713   feeding supplement (ENSURE ENLIVE / ENSURE PLUS) liquid 237 mL  237 mL Oral Q24H Gonfa, Taye T, MD   237 mL at 12/21/21 1445   lactulose (CHRONULAC) 10 GM/15ML solution 20 g  20 g Oral BID Freda Jackson B, MD   20 g at 12/21/21 2207   levothyroxine (SYNTHROID) tablet 50 mcg  50 mcg Oral Q0600 Freda Jackson B, MD   50 mcg at 12/22/21 0641   LORazepam (ATIVAN) tablet 0.5 mg  0.5 mg Oral Q6H PRN Wendee Beavers T, MD   0.5 mg at 12/22/21 0640   magnesium sulfate IVPB 2 g 50 mL  2 g Intravenous Once Wendee Beavers T, MD       MEDLINE mouth rinse  15 mL Mouth Rinse BID Freda Jackson B, MD   15 mL at 12/21/21 2210   metroNIDAZOLE (FLAGYL) IVPB 500 mg  500 mg Intravenous Q12H Wendee Beavers T, MD 100 mL/hr at 12/22/21 0646 500 mg at 12/22/21 0646   multivitamin with minerals tablet 1 tablet  1 tablet Oral Daily Wendee Beavers T, MD   1 tablet at 12/21/21 1030   mupirocin ointment (BACTROBAN) 2 % 1 application.  1 application. Nasal BID Mercy Riding, MD   1 application. at 12/21/21 2207   oxyCODONE (Oxy  IR/ROXICODONE) immediate release tablet 20 mg  20 mg Oral Q6H PRN Wendee Beavers T, MD   20 mg at 12/22/21 0105   pantoprazole sodium (PROTONIX) 40 mg/20 mL oral suspension 40 mg  40 mg Oral Daily Freda Jackson B, MD   40 mg at 12/21/21 1029   polyethylene glycol (MIRALAX / GLYCOLAX) packet 17 g  17 g Oral Daily PRN Freddi Starr, MD         Discharge Medications: Please see discharge summary for a list of discharge medications.  Relevant Imaging Results:  Relevant Lab Results:   Additional Information SSN: [id number]  Leeroy Cha, RN

## 2021-12-22 NOTE — Progress Notes (Signed)
PROGRESS NOTE  Derek Anon Riffe Jr. WJX:[id number] DOB: [birth year]   PCP: [name], FNP  Patient is from: Home.  Uses wheelchair at baseline.  DOA: 12/18/2021 LOS: 4  Chief complaints Chief Complaint  Patient presents with   Failure To Thrive     Brief Narrative / Interim history: 71 year old M with PMH of EtOH cirrhosis, nonimmune hemolytic anemia, pancytopenia, right AKA and hepatitis C brought to ED with altered mental status after he slid off his bed and found down on the ground by family, and admitted to ICU with hypovolemic shock, hemolytic anemia, liver failure, encephalopathy, AKI and hyperkalemia.  In ED, hypotensive and altered.  Hgb 4.0.  Lactic acid> 9.  AST 495.  ALT 107.  Bili 8.  He was started on Levophed, IV antibiotics and blood transfusion.  GI and hematology consulted.  RUQ ultrasound concerning for liver cirrhosis and liver mass.   Patient came off Levophed and transferred to [facility] hospitalist service on 5/24.   MRI of liver without contrast was very limited due to IV contrast and respiratory motion artifact but concerning for liver cirrhosis, portal HTN, splenomegaly, ascites, gallbladder wall thickening with surrounding inflammatory fat stranding and concern for possible small abscess or perforation with potential for cholecystocolonic fistula.  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  General surgery consulted.  Patient was on IV fluid for AKI and rhabdomyolysis but developed significant scrotal edema.  IV fluid discontinued.     Subjective: Seen and examined earlier this morning.  No major events overnight or this morning.  Complains of scrotal left leg pain.  Also noted scrotal swelling.  He thinks he might have a kidney stone.  He denies back pain.  He denies chest pain, shortness of breath, dysuria, hematuria, nausea or vomiting.  Patient's son is at  bedside.  Objective: Vitals:   12/22/21 0612 12/22/21 1209 12/22/21 1354 12/22/21 1435  BP: (!) 147/65 (!) 149/58 107/64 123/66  Pulse: 88 97 (!) 159 95  Resp: '16 16  18  '$ Temp: 97.8 F (36.6 C) 97.7 F (36.5 C) 99.7 F (37.6 C)   TempSrc: Oral Oral Oral   SpO2: 98% 96% 99%   Weight:      Height:        Examination:  GENERAL: No apparent distress.  Nontoxic. HEENT: MMM.  Vision and hearing grossly intact.  NECK: Supple.  No apparent JVD.  RESP:  No IWOB.  Fair aeration bilaterally. CVS:  RRR. Heart sounds normal.  ABD/GI/GU: BS+. Abd soft, NTND.  Significant scrotal swelling/edema.  No tenderness. MSK/EXT: Right AKA.  Trace LLE edema. SKIN: no apparent skin lesion or wound NEURO: Awake and alert.  Fairly oriented.  No apparent focal neuro deficit. PSYCH: Calm. Normal affect.   Procedures:  None  Microbiology summarized: [id number] and influenza PCR nonreactive. MRSA PCR screen positive. Blood cultures NGTD Urine cultures NGTD  Assessment and Plan: * Septic shock (HCC) Tachypneic, hypotensive with AKI, lactic acidosis and thrombocytopenia, and required vasopressors on admission.  Imaging raises concern for calculus cholecystitis and possible cholecysto colonic fistula. Surprisingly, his abdominal exam is benign.  Blood and urine cultures NGTD.  There is also some degree of hypovolemic shock from dehydration and acute on chronic hemolytic anemia in the setting of liver cirrhosis and worsening liver failure.    Shock physiology resolved. -General surgery: continue antibiotics and consider perc chole if worse.  -Discontinue IV fluid due to significant scrotal edema -  Follow CT a/p with contrast, mainly to look at possible liver lesion noted on [place]  SVT (supraventricular tachycardia) ([place]) As symptomatic.  Normotensive.  Resolved with IV metoprolol. -IV metoprolol as needed -Continue telemetry monitoring  Acute metabolic encephalopathy Multifactorial including shock,  sepsis, uremia, hepatic encephalopathy or polypharmacy.  Seems to have improved.  He is oriented x4 except time. -Continue lactulose -Treat treatable causes -Minimize sedating medications -Reorientation and delirium precautions.  AKI (acute kidney injury) ([place]) Recent Labs    12/28/20 1241 01/11/21 1518 02/03/21 1037 05/22/21 1037 12/18/21 1124 12/18/21 1751 12/19/21 1030 12/20/21 0223 12/21/21 0244 12/22/21 0413  BUN '16 18 11 15 '$ 38* 42* 56* 63* 42* 22  CREATININE 0.75 0.68 0.71 0.54* 1.70* 1.44* 1.70* 1.89* 1.11 0.61  Likely due to hypotension and rhabdomyolysis.  Resolved. -Recheck renal function in the morning -Avoid nephrotoxic meds   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 Elevated liver enzymes/hyperbilirubinemia Recent Labs  Lab 12/18/21 1751 12/19/21 1030 12/20/21 0223 12/21/21 0244 12/22/21 0413  AST 2,284* 1,436* 678* 402* 426*  ALT 545* 627* 487* 330* 239*  ALKPHOS 66 69 71 77 74  BILITOT 8.8* 5.6* 3.9* 2.2* 1.4*  PROT 6.1* 5.9* 5.7* 5.4* 4.9*  ALBUMIN 2.9* 2.7* 2.5* 2.3* 1.9*  LFT pattern consistent rhabdo.  CK elevated to 4231> 7800>> 10,700.  He has mild coagulopathy as well. -Discontinued fluids due to significant scrotal edema -Continue monitoring LFT and CK  Hemolytic anemia (HCC) Concern for non-autoimmune hemolytic anemia due to liver cirrhosis and splenomegaly Recent Labs    11/21/21 1349 12/18/21 1124 12/18/21 2207 12/19/21 0621 12/19/21 1151 12/20/21 0223 12/20/21 0801 12/21/21 0244 12/21/21 1253 12/22/21 0825  HGB 8.5* 4.0* 6.7* 7.1* 7.2* 6.7* 9.6* 7.1* 7.2* 6.0*  Transfused about 4 units so far.  LDH markedly elevated.  Haptoglobin low.  -Hgb dropped 1 g but could also be hemodilution from vigorous IV fluid -IV fluid discontinued. -Transfuse for Hgb <7.0.  Ordered 1 unit. -Appreciate help by hematology/oncology    Left leg weakness and numbness ABI with severe PAD which might be  contributing. -Consider vascular consult once stable  -Closely monitor  Hyperkalemia, hyponatremia and hypomagnesemia Hyperkalemia resolved.  Hyponatremia mild and stable. -IV magnesium sulfate 2 g x 1  Traumatic rhabdomyolysis (HCC) Likely traumatic from recent fall.  CK 4231>> 7835 -Continue IV fluid-increase rate -Continue monitoring  Abnormal MRI, liver RUQ [place] concerning for 6 cm liver mass.  Not appreciated on MRI and CT although those tests are nondiagnostic without contrast. -Ordered CT abdomen and pelvis with contrast after discussion with radiology  Thrombocytopenia ([place]) Recent Labs  Lab 12/18/21 1124 12/18/21 2207 12/19/21 0621 12/19/21 1151 12/20/21 0223 12/21/21 0244 12/21/21 1253 12/22/21 0825  PLT 78* 58* 40* 38* 31* 25* 21* 24*  Continue monitoring.  Transfuse for platelet < 10k or bleeding   Physical deconditioning Wheelchair-bound at baseline.  He says he is able to transfer using left leg at baseline. -PT/OT recommended SNF -Left leg evaluation as above  Elevated troponin Likely demand ischemia in the setting of sepsis and shock  Calculus of bile duct with acute cholecystitis See shock  Chronic pain syndrome Radius oxycodone to 20 mg every 6 hours.  Per database, feels 120 for 30 days  Pressure skin injury: POA Pressure Injury 12/18/21 Foot Anterior;Left;Lateral Deep Tissue Pressure Injury - Purple or maroon localized area of discolored intact skin or blood-filled blister due to damage of underlying soft tissue from pressure and/or shear. purple, red (Active)  12/18/21  1530  Location: Foot  Location Orientation: Anterior;Left;Lateral  Staging: Deep Tissue Pressure Injury - Purple or maroon localized area of discolored intact skin or blood-filled blister due to damage of underlying soft tissue from pressure and/or shear.  Wound Description (Comments): purple, red  Present on Admission: Yes  Dressing Type Foam - Lift dressing to assess site  every shift 12/22/21 0730   DVT prophylaxis:  SCDs Start: 12/18/21 1421  Code Status: Full code Family Communication: Updated patient's sons at bedside Level of care: Progressive Status is: Inpatient Remains inpatient appropriate because: Rhabdomyolysis, hemolytic anemia and physical deconditioning   Final disposition: SNF Consultants:  Pulmonology Oncology/hematology General surgery  Sch Meds:  Scheduled Meds:  (feeding supplement) PROSource Plus  30 mL Oral Daily   sodium chloride   Intravenous Once   Chlorhexidine Gluconate Cloth  6 each Topical Daily   feeding supplement  1 Container Oral BID BM   feeding supplement  237 mL Oral Q24H   iohexol  500 mL Oral Q1H   lactulose  20 g Oral BID   levothyroxine  50 mcg Oral Q0600   mouth rinse  15 mL Mouth Rinse BID   multivitamin with minerals  1 tablet Oral Daily   mupirocin ointment  1 application. Nasal BID   pantoprazole sodium  40 mg Oral Daily   Continuous Infusions:  sodium chloride     cefTRIAXone (ROCEPHIN)  IV 2 g (12/22/21 1045)   metronidazole 500 mg (12/22/21 0646)   PRN Meds:.Place/Maintain arterial line **AND** sodium chloride, acetaminophen, docusate, LORazepam, metoprolol tartrate, oxyCODONE, polyethylene glycol  Antimicrobials: Anti-infectives (From admission, onward)    Start     Dose/Rate Route Frequency Ordered Stop   12/21/21 1100  cefTRIAXone (ROCEPHIN) 2 g in sodium chloride 0.9 % 100 mL IVPB        2 g 200 mL/hr over 30 Minutes Intravenous Every 24 hours 12/21/21 0900     12/20/21 1830  metroNIDAZOLE (FLAGYL) IVPB 500 mg        500 mg 100 mL/hr over 60 Minutes Intravenous Every 12 hours 12/20/21 1727     12/20/21 1100  cefTRIAXone (ROCEPHIN) 1 g in sodium chloride 0.9 % 100 mL IVPB  Status:  Discontinued        1 g 200 mL/hr over 30 Minutes Intravenous Every 24 hours 12/20/21 1006 12/21/21 0900   12/19/21 2000  vancomycin (VANCOREADY) IVPB 1250 mg/250 mL  Status:  Discontinued        1,250  mg 166.7 mL/hr over 90 Minutes Intravenous Every 24 hours 12/18/21 1946 12/19/21 1207   12/19/21 1100  ceFEPIme (MAXIPIME) 2 g in sodium chloride 0.9 % 100 mL IVPB  Status:  Discontinued        2 g 200 mL/hr over 30 Minutes Intravenous Every 12 hours 12/19/21 1038 12/20/21 1006   12/19/21 0600  ceFEPIme (MAXIPIME) 2 g in sodium chloride 0.9 % 100 mL IVPB  Status:  Discontinued        2 g 200 mL/hr over 30 Minutes Intravenous Every 12 hours 12/18/21 1943 12/19/21 1038   12/18/21 1845  vancomycin (VANCOREADY) IVPB 2000 mg/400 mL        2,000 mg 200 mL/hr over 120 Minutes Intravenous STAT 12/18/21 1828 12/18/21 2151   12/18/21 1830  ceFEPIme (MAXIPIME) 2 g in sodium chloride 0.9 % 100 mL IVPB        2 g 200 mL/hr over 30 Minutes Intravenous STAT 12/18/21 1818 12/18/21 1951  I have personally reviewed the following labs and images: CBC: Recent Labs  Lab 12/18/21 1124 12/18/21 2207 12/19/21 1151 12/20/21 0223 12/20/21 0801 12/21/21 0244 12/21/21 1253 12/22/21 0825  WBC 8.1   < > 8.2 8.6  --  5.7 5.9 4.1  NEUTROABS 6.5  --   --   --   --   --   --   --   HGB 4.0*   < > 7.2* 6.7* 9.6* 7.1* 7.2* 6.0*  HCT 12.5*   < > 21.3* 20.3* 29.0* 20.8* 22.3* 18.4*  MCV 105.0*   < > 95.5 94.9  --  94.1 97.4 97.4  PLT 78*   < > 38* 31*  --  25* 21* 24*   < > = values in this interval not displayed.   BMP &GFR Recent Labs  Lab 12/18/21 1751 12/19/21 0621 12/19/21 1030 12/20/21 0223 12/21/21 0244 12/22/21 0413  NA 136  --  135 134* 134* 133*  K 5.1  --  4.5 3.9 4.3 3.8  CL 100  --  102 103 104 103  CO2 19*  --  21* 21* 24 24  GLUCOSE 213*  --  208* 203* 148* 164*  BUN 42*  --  56* 63* 42* 22  CREATININE 1.44*  --  1.70* 1.89* 1.11 0.61  CALCIUM 7.5*  --  7.6* 7.8* 7.9* 7.6*  MG  --  1.8  --  2.3 1.9 1.6*  PHOS  --  4.7*  --   --  3.3 2.6   Estimated Creatinine Clearance: 102.2 mL/min (by C-G formula based on SCr of 0.61 mg/dL). Liver & Pancreas: Recent Labs  Lab  12/18/21 1751 12/19/21 1030 12/20/21 0223 12/21/21 0244 12/22/21 0413  AST 2,284* 1,436* 678* 402* 426*  ALT 545* 627* 487* 330* 239*  ALKPHOS 66 69 71 77 74  BILITOT 8.8* 5.6* 3.9* 2.2* 1.4*  PROT 6.1* 5.9* 5.7* 5.4* 4.9*  ALBUMIN 2.9* 2.7* 2.5* 2.3* 1.9*   Recent Labs  Lab 12/18/21 1124  LIPASE 30   Recent Labs  Lab 12/18/21 1222 12/21/21 0244 12/22/21 0405  AMMONIA 52* 30 20   Diabetic: Recent Labs    12/20/21 0801  HGBA1C 4.9   Recent Labs  Lab 12/18/21 1544  GLUCAP 59*   Cardiac Enzymes: Recent Labs  Lab 12/20/21 0801 12/21/21 0244 12/22/21 0413  CKTOTAL 4,231* 7,837* 10,683*   No results for input(s): PROBNP in the last 8760 hours. Coagulation Profile: Recent Labs  Lab 12/18/21 1124 12/21/21 0244 12/22/21 0413  INR 1.8* 1.4* 1.4*   Thyroid Function Tests: No results for input(s): TSH, T4TOTAL, FREET4, T3FREE, THYROIDAB in the last 72 hours. Lipid Profile: No results for input(s): CHOL, HDL, LDLCALC, TRIG, CHOLHDL, LDLDIRECT in the last 72 hours. Anemia Panel: No results for input(s): VITAMINB12, FOLATE, FERRITIN, TIBC, IRON, RETICCTPCT in the last 72 hours.  Urine analysis:    Component Value Date/Time   COLORURINE AMBER (A) 12/18/2021 1139   APPEARANCEUR CLEAR 12/18/2021 1139   LABSPEC 1.013 12/18/2021 1139   PHURINE 5.0 12/18/2021 1139   GLUCOSEU NEGATIVE 12/18/2021 1139   GLUCOSEU NEGATIVE 01/06/2015 0912   HGBUR LARGE (A) 12/18/2021 1139   BILIRUBINUR NEGATIVE 12/18/2021 1139   KETONESUR 5 (A) 12/18/2021 1139   PROTEINUR 100 (A) 12/18/2021 1139   UROBILINOGEN 1.0 01/06/2015 0912   NITRITE NEGATIVE 12/18/2021 1139   LEUKOCYTESUR NEGATIVE 12/18/2021 1139   Sepsis Labs: Invalid input(s): PROCALCITONIN, [place]  Microbiology: Recent Results (from the past 240 hour(s))  Blood Culture (routine x 2)     Status: None (Preliminary result)   Collection Time: 12/18/21 11:24 AM   Specimen: BLOOD  Result Value Ref Range Status    Specimen Description   Final    BLOOD BLOOD RIGHT HAND Performed at [facility] [address] [place], [place] [address]    Special Requests   Final    BOTTLES DRAWN AEROBIC AND ANAEROBIC Blood Culture adequate volume Performed at [facility] [address], [place], [place] [address]    Culture   Final    NO GROWTH 4 DAYS Performed at [facility], [place] [address] [place], [place] [address]    Report Status PENDING  Incomplete  Blood Culture (routine x 2)     Status: None (Preliminary result)   Collection Time: 12/18/21 11:32 AM   Specimen: BLOOD  Result Value Ref Range Status   Specimen Description   Final    BLOOD RIGHT ANTECUBITAL Performed at [facility] [address], [place], [place] [address]    Special Requests   Final    BOTTLES DRAWN AEROBIC AND ANAEROBIC Blood Culture results may not be optimal due to an excessive volume of blood received in culture bottles Performed at [facility] [address] [place], [place] [address]    Culture   Final    NO GROWTH 4 DAYS Performed at [facility], [place] [address], [place], [place] [address]    Report Status PENDING  Incomplete  Urine Culture     Status: None   Collection Time: 12/18/21 11:39 AM   Specimen: In/Out Cath Urine  Result Value Ref Range Status   Specimen Description   Final    IN/OUT CATH URINE Performed at [facility] [address] [place], [place] [address]    Special Requests   Final    NONE Performed at [facility], [place] [address], [place], [place] [address]    Culture   Final    NO GROWTH Performed at [facility], [place] [address] [place], [place] [address]    Report Status 12/20/2021 FINAL  Final  Resp Panel by RT-PCR (Flu A&B, Covid) Nasopharyngeal Swab     Status: None   Collection Time: 12/18/21 12:22 PM   Specimen: Nasopharyngeal Swab;  Nasopharyngeal(NP) swabs in vial transport medium  Result Value Ref Range Status   SARS Coronavirus 2 by RT PCR NEGATIVE NEGATIVE Final    Comment: (NOTE) SARS-CoV-2 target nucleic acids are NOT DETECTED.  The SARS-CoV-2 RNA is generally detectable in upper respiratory specimens during the acute phase of infection. The lowest concentration of SARS-CoV-2 viral copies this assay can detect is 138 copies/mL. A negative result does not preclude SARS-Cov-2 infection and should not be used as the sole basis for treatment or other patient management decisions. A negative result may occur with  improper specimen collection/handling, submission of specimen other than nasopharyngeal swab, presence of viral mutation(s) within the areas targeted by this assay, and inadequate number of viral copies(<138 copies/mL). A negative result must be combined with clinical observations, patient history, and epidemiological information. The expected result is Negative.  Fact Sheet for Patients:  EntrepreneurPulse.com.au  Fact Sheet for Healthcare Providers:  IncredibleEmployment.be  This test is no t yet approved or cleared by the [place] FDA and  has been authorized for detection and/or diagnosis of SARS-CoV-2 by FDA under an Emergency Use Authorization (EUA).  This EUA will remain  in effect (meaning this test can be used) for the duration of the COVID-19 declaration under Section 564(b)(1) of the Act, 21 U.S.C.section 360bbb-3(b)(1), unless the authorization is terminated  or revoked sooner.       Influenza A by PCR NEGATIVE NEGATIVE Final   Influenza B by PCR NEGATIVE NEGATIVE Final    Comment: (NOTE) The Xpert Xpress SARS-CoV-2/FLU/RSV plus assay is intended as an aid in the diagnosis of influenza from Nasopharyngeal swab specimens and should not be used as a sole basis for treatment. Nasal washings and aspirates are unacceptable for Xpert Xpress  SARS-CoV-2/FLU/RSV testing.  Fact Sheet for Patients: EntrepreneurPulse.com.au  Fact Sheet for Healthcare Providers: IncredibleEmployment.be  This test is not yet approved or cleared by the [place] FDA and has been authorized for detection and/or diagnosis of SARS-CoV-2 by FDA under an Emergency Use Authorization (EUA). This EUA will remain in effect (meaning this test can be used) for the duration of the COVID-19 declaration under Section 564(b)(1) of the Act, 21 U.S.C. section 360bbb-3(b)(1), unless the authorization is terminated or revoked.  Performed at [facility], [place] [address], [place], [place] [address]   MRSA Next Gen by PCR, Nasal     Status: Abnormal   Collection Time: 12/20/21  9:40 AM   Specimen: Nasal Mucosa; Nasal Swab  Result Value Ref Range Status   MRSA by PCR Next Gen DETECTED (A) NOT DETECTED Final    Comment: (NOTE) The GeneXpert MRSA Assay (FDA approved for NASAL specimens only), is one component of a comprehensive MRSA colonization surveillance program. It is not intended to diagnose MRSA infection nor to guide or monitor treatment for MRSA infections. Test performance is not FDA approved in patients less than 12 years old. Performed at [facility], [place] [address], [place], [place] [address]     Radiology Studies: No results found.    Juvon Teater T. Clarke  If 7PM-7AM, please contact night-coverage [email or web address] 12/22/2021, 2:50 PM

## 2021-12-22 NOTE — Progress Notes (Signed)
   12/22/21 9211  Provider Notification  Provider Name/Title Wendee Beavers MD  Date Provider Notified 12/22/21  Time Provider Notified [phone]  Method of Notification Page;Face-to-face  Notification Reason Critical result  Test performed and critical result Hgb  Date Critical Result Received 12/22/21  Time Critical Result Received 0935  Provider response See new orders  Date of Provider Response 12/22/21  Time of Provider Response [phone]

## 2021-12-22 NOTE — Plan of Care (Signed)
  Problem: Safety: Goal: Ability to remain free from injury will improve Outcome: Progressing   Problem: Clinical Measurements: Goal: Ability to maintain clinical measurements within normal limits will improve Outcome: Not Progressing Goal: Diagnostic test results will improve Outcome: Not Progressing

## 2021-12-22 NOTE — Assessment & Plan Note (Addendum)
One episode on 5/26.  As symptomatic.  Normotensive.  Resolved with IV metoprolol. -IV metoprolol as needed -Continue telemetry monitoring

## 2021-12-23 ENCOUNTER — Inpatient Hospital Stay (HOSPITAL_COMMUNITY): Payer: Medicare Other

## 2021-12-23 ENCOUNTER — Encounter (HOSPITAL_COMMUNITY): Payer: Self-pay | Admitting: Pulmonary Disease

## 2021-12-23 DIAGNOSIS — G9341 Metabolic encephalopathy: Secondary | ICD-10-CM | POA: Diagnosis not present

## 2021-12-23 DIAGNOSIS — R571 Hypovolemic shock: Secondary | ICD-10-CM | POA: Diagnosis not present

## 2021-12-23 DIAGNOSIS — N179 Acute kidney failure, unspecified: Secondary | ICD-10-CM | POA: Diagnosis not present

## 2021-12-23 DIAGNOSIS — N5089 Other specified disorders of the male genital organs: Secondary | ICD-10-CM

## 2021-12-23 DIAGNOSIS — D62 Acute posthemorrhagic anemia: Secondary | ICD-10-CM

## 2021-12-23 DIAGNOSIS — M79652 Pain in left thigh: Secondary | ICD-10-CM

## 2021-12-23 DIAGNOSIS — K922 Gastrointestinal hemorrhage, unspecified: Secondary | ICD-10-CM

## 2021-12-23 DIAGNOSIS — K7031 Alcoholic cirrhosis of liver with ascites: Secondary | ICD-10-CM | POA: Diagnosis not present

## 2021-12-23 DIAGNOSIS — Z7189 Other specified counseling: Secondary | ICD-10-CM

## 2021-12-23 DIAGNOSIS — D735 Infarction of spleen: Secondary | ICD-10-CM

## 2021-12-23 HISTORY — PX: IR PERC CHOLECYSTOSTOMY: IMG2326

## 2021-12-23 LAB — LACTIC ACID, PLASMA: Lactic Acid, Venous: 2.2 mmol/L (ref 0.5–1.9)

## 2021-12-23 LAB — PROTIME-INR
INR: 1.3 — ABNORMAL HIGH (ref 0.8–1.2)
Prothrombin Time: 15.8 seconds — ABNORMAL HIGH (ref 11.4–15.2)

## 2021-12-23 LAB — COMPREHENSIVE METABOLIC PANEL
ALT: 205 U/L — ABNORMAL HIGH (ref 0–44)
AST: 377 U/L — ABNORMAL HIGH (ref 15–41)
Albumin: 2 g/dL — ABNORMAL LOW (ref 3.5–5.0)
Alkaline Phosphatase: 73 U/L (ref 38–126)
Anion gap: 5 (ref 5–15)
BUN: 16 mg/dL (ref 8–23)
CO2: 25 mmol/L (ref 22–32)
Calcium: 7.6 mg/dL — ABNORMAL LOW (ref 8.9–10.3)
Chloride: 104 mmol/L (ref 98–111)
Creatinine, Ser: 0.55 mg/dL — ABNORMAL LOW (ref 0.61–1.24)
GFR, Estimated: >60 mL/min (ref >60)
Glucose, Bld: 147 mg/dL — ABNORMAL HIGH (ref 70–99)
Potassium: 3.8 mmol/L (ref 3.5–5.1)
Sodium: 134 mmol/L — ABNORMAL LOW (ref 135–145)
Total Bilirubin: 1.4 mg/dL — ABNORMAL HIGH (ref 0.3–1.2)
Total Protein: 5 g/dL — ABNORMAL LOW (ref 6.5–8.1)

## 2021-12-23 LAB — CBC
HCT: 21.1 % — ABNORMAL LOW (ref 39.0–52.0)
Hemoglobin: 7.1 g/dL — ABNORMAL LOW (ref 13.0–17.0)
MCH: 32.1 pg (ref 26.0–34.0)
MCHC: 33.6 g/dL (ref 30.0–36.0)
MCV: 95.5 fL (ref 80.0–100.0)
Platelets: 25 10*3/uL — CL (ref 150–400)
RBC: 2.21 MIL/uL — ABNORMAL LOW (ref 4.22–5.81)
RDW: 18.6 % — ABNORMAL HIGH (ref 11.5–15.5)
WBC: 4.6 10*3/uL (ref 4.0–10.5)
nRBC: 0.4 % — ABNORMAL HIGH (ref 0.0–0.2)

## 2021-12-23 LAB — CULTURE, BLOOD (ROUTINE X 2)
Culture: NO GROWTH
Culture: NO GROWTH
Special Requests: ADEQUATE

## 2021-12-23 LAB — BPAM PLATELET PHERESIS
Blood Product Expiration Date: 202305272359
Unit Type and Rh: 600

## 2021-12-23 LAB — HEMOGLOBIN AND HEMATOCRIT, BLOOD
HCT: 21.8 % — ABNORMAL LOW (ref 39.0–52.0)
Hemoglobin: 7.5 g/dL — ABNORMAL LOW (ref 13.0–17.0)

## 2021-12-23 LAB — CK: Total CK: 8396 U/L — ABNORMAL HIGH (ref 49–397)

## 2021-12-23 LAB — MAGNESIUM: Magnesium: 1.6 mg/dL — ABNORMAL LOW (ref 1.7–2.4)

## 2021-12-23 LAB — PHOSPHORUS: Phosphorus: 2.8 mg/dL (ref 2.5–4.6)

## 2021-12-23 LAB — AMMONIA: Ammonia: 36 umol/L — ABNORMAL HIGH (ref 9–35)

## 2021-12-23 LAB — PREPARE RBC (CROSSMATCH)

## 2021-12-23 MED ORDER — MORPHINE SULFATE (PF) 2 MG/ML IV SOLN
1.0000 mg | INTRAVENOUS | Status: DC | PRN
  Administered 2021-12-23: 2 mg via INTRAVENOUS
  Filled 2021-12-23: qty 1

## 2021-12-23 MED ORDER — FENTANYL CITRATE (PF) 100 MCG/2ML IJ SOLN
INTRAMUSCULAR | Status: AC
  Filled 2021-12-23: qty 2

## 2021-12-23 MED ORDER — MAGNESIUM SULFATE 2 GM/50ML IV SOLN
2.0000 g | Freq: Once | INTRAVENOUS | Status: AC
  Administered 2021-12-23: 2 g via INTRAVENOUS
  Filled 2021-12-23: qty 50

## 2021-12-23 MED ORDER — SODIUM CHLORIDE 0.9 % IV SOLN
2.0000 g | Freq: Three times a day (TID) | INTRAVENOUS | Status: DC
  Administered 2021-12-23 – 2021-12-26 (×9): 2 g via INTRAVENOUS
  Filled 2021-12-23 (×10): qty 12.5

## 2021-12-23 MED ORDER — SODIUM CHLORIDE 0.9% IV SOLUTION: Freq: Once | INTRAVENOUS | Status: AC

## 2021-12-23 MED ORDER — MEPERIDINE HCL 100 MG/ML IJ SOLN
INTRAMUSCULAR | Status: AC
  Filled 2021-12-23: qty 1

## 2021-12-23 MED ORDER — LIDOCAINE HCL 1 % IJ SOLN
INTRAMUSCULAR | Status: AC
  Administered 2021-12-23: 10 mL
  Filled 2021-12-23: qty 20

## 2021-12-23 MED ORDER — PANTOPRAZOLE SODIUM 40 MG IV SOLR
40.0000 mg | Freq: Two times a day (BID) | INTRAVENOUS | Status: DC
  Administered 2021-12-23 – 2021-12-26 (×9): 40 mg via INTRAVENOUS
  Filled 2021-12-23 (×9): qty 10

## 2021-12-23 MED ORDER — MIDAZOLAM HCL 2 MG/2ML IJ SOLN
INTRAMUSCULAR | Status: AC
Start: 2021-12-23 — End: 2021-12-23
  Filled 2021-12-23: qty 2

## 2021-12-23 MED ORDER — HYDROMORPHONE HCL 1 MG/ML IJ SOLN
0.5000 mg | INTRAMUSCULAR | Status: DC | PRN
Start: 2021-12-23 — End: 2021-12-26
  Administered 2021-12-23 – 2021-12-26 (×11): 0.5 mg via INTRAVENOUS
  Filled 2021-12-23: qty 1
  Filled 2021-12-23 (×3): qty 0.5
  Filled 2021-12-23 (×3): qty 1
  Filled 2021-12-23 (×3): qty 0.5
  Filled 2021-12-23: qty 1

## 2021-12-23 MED ORDER — MIDAZOLAM HCL 2 MG/2ML IJ SOLN
INTRAMUSCULAR | Status: AC | PRN
  Administered 2021-12-23 (×2): 1 mg via INTRAVENOUS

## 2021-12-23 MED ORDER — FENTANYL CITRATE (PF) 100 MCG/2ML IJ SOLN
INTRAMUSCULAR | Status: AC | PRN
  Administered 2021-12-23: 25 ug via INTRAVENOUS
  Administered 2021-12-23: 50 ug via INTRAVENOUS

## 2021-12-23 MED ORDER — IOHEXOL 300 MG/ML  SOLN
50.0000 mL | Freq: Once | INTRAMUSCULAR | Status: AC | PRN
  Administered 2021-12-23: 10 mL

## 2021-12-23 MED ORDER — SODIUM CHLORIDE 0.9% FLUSH
5.0000 mL | Freq: Three times a day (TID) | INTRAVENOUS | Status: DC
  Administered 2021-12-23 – 2022-01-04 (×35): 5 mL

## 2021-12-23 MED ORDER — VANCOMYCIN HCL 1250 MG/250ML IV SOLN
1250.0000 mg | Freq: Two times a day (BID) | INTRAVENOUS | Status: DC
  Administered 2021-12-23 – 2021-12-25 (×6): 1250 mg via INTRAVENOUS
  Filled 2021-12-23 (×7): qty 250

## 2021-12-23 NOTE — Progress Notes (Addendum)
[facility] Progress Note Patient Name: [name]. DOB: Apr 23, 1951 MRN: [id number]   Date of Service  12/23/2021  HPI/Events of Note  CT results with concern for acute cholecystitis and possible lower extremity necrotizing fascitis reviewed,  I paged general surgery and Dr, Julieta Gutting returned the call, he stated that they had previously evaluated the patient and consider him too risky for surgery.  eICU Interventions  Interventional radiology paged regarding possibility of a percutaneous  cholecystostomy to drain the gall bladder. I am awaiting a call back from IR. I spoke with IR (Dr. Dwaine Gale) and the plan is for perc drainage this AM, he requests that platelets be available for transfusion at the time of the procedure, he had previously spoken with the critical care nurse practitioner and the general surgeon. PRN Morphine ordered for pain.         [name] [name] 12/23/2021, 3:33 AM

## 2021-12-23 NOTE — Assessment & Plan Note (Addendum)
-  Likely due in the setting of liver cirrhosis, hypoalbuminemia and IVF hydration.  -IV fluid discontinued and now Diuresis started and increasing as above -Continue monitoring and will need Scrotal Sling and Scrotal Support -May discuss with Urology for further outpatient workup

## 2021-12-23 NOTE — Progress Notes (Signed)
Pharmacy Antibiotic Note  [name]. is a 71 y.o. male with cirrhosis, hepatitis C, immune hemolytic anemia, chronic thrombocytopenia and leukopenia admitted on 12/18/2021 with weakness and confusion.  Transferred to ICU 5/27 with GIB and worsening cholecystitis. Pharmacy has been consulted to broaden abx from ceftriaxone to vancomycin and cefepime dosing, also on flagyl.  Plan: Cefepime 2g IV q8h Vancomycin '1250mg'$  IV q12h for estimated AUC 439 using SCr rounded up 0.8 Check vancomycin levels at steady state, goal AUC 400-550 Follow up renal function & cultures  Height: 6' (182.9 cm) Weight: 98.2 kg (216 lb 7.9 oz) IBW/kg (Calculated) : 77.6  Temp (24hrs), Avg:98.7 F (37.1 C), Min:97.7 F (36.5 C), Max:100 F (37.8 C)  Recent Labs  Lab 12/18/21 1222 12/18/21 1545 12/18/21 1751 12/19/21 0621 12/19/21 1030 12/19/21 1151 12/20/21 0223 12/21/21 0244 12/21/21 1253 12/22/21 0413 12/22/21 0825 12/22/21 2345  WBC  --   --    < > 6.9  --    < > 8.6 5.7 5.9  --  4.1 4.6  CREATININE  --   --    < >  --  1.70*  --  1.89* 1.11  --  0.61  --  0.55*  LATICACIDVEN >9.0* >9.0*  --  2.6*  --   --   --   --   --   --   --   --    < > = values in this interval not displayed.    Estimated Creatinine Clearance: 101.3 mL/min (A) (by C-G formula based on SCr of 0.55 mg/dL (L)).    Allergies  Allergen Reactions   Clindamycin Dermatitis    5/22 Vancomycin >> 5/23, resume 5/27 >> 5/22 Cefepime >>5/24, resume 5/27 >> 5/24 Ceftriaxone >> 5/27 5/24 Flagyl>>   5/22 BCx x2: NGF 5/22 UCx: ngf 5/22 MRSA PCR: detected 5/24 MRSA PCR: detected 5/27 BCx:  Thank you for allowing pharmacy to be a part of this patient's care.  Peggyann Juba, PharmD, BCPS Pharmacy: [phone] 12/23/2021 9:28 AM

## 2021-12-23 NOTE — Progress Notes (Signed)
Refused morning lab work. Agreed to try again at later time.

## 2021-12-23 NOTE — Progress Notes (Signed)
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  Notified by RN at 1:00am that patient was actively having large bloody stools.  On review of vitals, the temperature was 37.3C, the heart rate 82 beats/minute, the blood pressure 123/41 mm Hg, the respiratory rate 24 breaths/minute, and the oxygen saturation 95% on RA.  Na+/ K+: 134/3.8 Glucose:147  BUN/Cr.:16/0.55 Calcium: 7.6 Mag: 1.6 AST/ALT:377/205 PT/INR: 15.8/1.3 CK: 4.231>7837>10.683>8,396   WBC/ TMAX:4.6 / afebrile Hgb/Hct: 7.1/21.1 Plts:25 Lactic acid:   CT Abd/Pelvis:1. Imaging findings compatible with acute cholecystitis. Gas is nowidentified within the lumen of the gallbladder. Diagnosis of exclusion would be acute emphysematous cholecystitis. Alternatively, presence of gas within the gallbladder lumen may reflect cholecystocolonic fistula although there is no patent contrast opacifying fistula identified on the current exam (with sufficient contrast material noted within the adjacent inflamed loop of colon). 2. Acute splenic infarct. 3. Marked asymmetric swelling of the medial musculature of the left thigh with gas tracking throughout the muscle with surrounding fat stranding. Cannot exclude necrotizing fasciitis. 4. Increase caliber of the duodenum within the left hemiabdomen favoring focal ileus. 5. Cirrhosis   Assessment & Plan  Acute Cholecystitis -Repeat CT abd/pelvis as above -Transfer to ICU -Discussed finding with on call General surgery Dr. Georgette Dover who recommends against intervention given current Liver cirrhosis and high risk for complications. Recommended IR eval for possible percutaneous drain.  Discussed with Dr. Dwaine Gale who will eval in the am for possible percutanous drain pending hemodynamic stability -Discussed with PCCM as well to eval given high risk for decompensation   GI Bleed  Acute/Chronic Blood Loss Anemia without evidence hemodynamic instability PMHx: Liver Cirrhosis, severe thrombocytopenia. Non-immune hemolytic anemia -At least 2x IV access, 18 gauge or larger -IVF resuscitation to maintain MAP>65 -H&H monitoring q6h -Transfuse PRN Hgb<8 -Will give empiric FFP+Platelets + 1 units PRBC given active bleeding with significantly low plts -Pantoprazole '40mg'$  IV BID -NPO for pending  -GI Consult for EGD/Colonoscopy -If massive lower GI bleed, CT angio to isolate source and IR Consult for embolization) -Hold NSAIDs, steroids, ASA -(SBPeritonitis) Antibiotic Prophylaxis with ceftriaxone 2g IV daily   Right Thigh swelling Likely traumatic post fall now with acute rhabdo, CK trending down CT concerning for ? Necrotizing Fasciitis -Discussed with general surgery who will eval     Rufina Falco, DNP, CCRN, FNP-C, AGACNP-BC Acute Care & Family Nurse Practitioner  [facility]  See Amion for personal pager PCCM on call pager [phone] until 7 am

## 2021-12-23 NOTE — Consult Note (Addendum)
Patient ID: Derek Anon Bornhorst Jr. MRN: [id number] DOB/AGE: 1950-08-30 71 y.o.  Admit date: 12/18/2021  Admission Diagnoses:  Principal Problem:   Septic shock ([place]) Active Problems:   Chronic pain syndrome   Goals of care, counseling/discussion   Physical deconditioning   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   Thrombocytopenia ([place])   AKI (acute kidney injury) ([place])   Acute metabolic encephalopathy   Elevated liver enzymes   Hyperbilirubinemia   Abnormal MRI, liver   Traumatic rhabdomyolysis (HCC)   Hyperkalemia, hyponatremia and hypomagnesemia   Hyperkalemia   Hyponatremia   Left leg weakness and numbness   Calculus of bile duct with acute cholecystitis   Elevated troponin   SVT (supraventricular tachycardia) (HCC)   Acute pain of left thigh   HPI: Ortho consult at the request of General Surgery team for left thigh evaluation. The patient is currently admitted to ICU with GI bleed and worsening cholecystitis. He has extensive PMH notable for cirrhosis, non-immune hemolytic anemia, hepatitis C, HLD, portal hypertension, hepatic encephalopathy.   He was initially admitted to [facility] in shock and has since been weaned off pressors. 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 His two sons and his own brother are at the bedside. The elder son reports he is the POA.   The patient was found down for unspecified period of time and has been managed for rhabdomyolysis since admission. He reports only mild left thigh pain but is otherwise comfortable at rest. He is receiving PRBC and platelets for planned percutaneous cholecystostomy tube by IR later today. He denies new numbness/tingling in the left lower extremity.   At baseline, the patient is wheelchair and bedbound. The patient reports using the left lower extremity to sit up  in his chair but otherwise has not really had much recent function in this leg. For several years, he has used crutches. Of note, from chart review, the patient was hospitalized at [facility] between 12/14/2020 until 12/18/2020 for left lower extremity pain. The leg was found to be swollen and erythematous and he was started on IV antibiotics for possible cellulitis with ceftriaxone. He recovered well from this and had no issues since that time.   The ICU team obtained a CT of the CAP yesterday and extended field of view down to scrotum to evaluate local swelling. The imaging incidentally captured edema and air within the medial musculature of the proximal thigh triggering an orthopaedic consult for further evaluation at the request of general surgery. He has been completely stable since admission and the left thigh pain has been improving per patient.   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   Squamous cell carcinoma of skin 05/21/2018   R mid volar forearm   Thyroid disease    UROLITHIASIS, HX OF 12/23/2007  Surgical History: Past Surgical History:  Procedure Laterality Date   ABOVE KNEE LEG AMPUTATION Right    Right femur fracture, 11/1994, resulted in right AKA 2006 after staph infection   COLONOSCOPY N/A 12/16/2020   Procedure: COLONOSCOPY;  Surgeon: Virgel Manifold, MD;  Location: [facility] ENDOSCOPY;  Service: Endoscopy;  Laterality: N/A;   ESOPHAGOGASTRODUODENOSCOPY N/A 12/16/2020   Procedure: ESOPHAGOGASTRODUODENOSCOPY  (EGD);  Surgeon: Virgel Manifold, MD;  Location: [facility] ENDOSCOPY;  Service: Endoscopy;  Laterality: N/A;    Family History: Family History  Problem Relation Age of Onset   Heart disease Mother    Lung cancer Mother 41   Heart attack Paternal Grandmother    Heart attack Paternal Grandfather    Liver cancer Neg Hx     Social History: Social History   Socioeconomic History   Marital status: Widowed    Spouse name: Not on file   Number of children: Not on file   Years of education: Not on file   Highest education level: Not on file  Occupational History   Occupation: Disabled  Tobacco Use   Smoking status: Never   Smokeless tobacco: Never  Substance and Sexual Activity   Alcohol use: Not Currently    Alcohol/week: 0.0 standard drinks   Drug use: No   Sexual activity: Not on file  Other Topics Concern   Not on file  Social History Narrative   Widowed 1997   Lives alone   Lost a son in his arms in 57.    3 sons   Social Determinants of Health   Financial Resource Strain: Not on file  Food Insecurity: Not on file  Transportation Needs: Not on file  Physical Activity: Not on file  Stress: Not on file  Social Connections: Not on file  Intimate Partner Violence: Not on file    Allergies: Clindamycin  Medications: I have reviewed the patient's current medications.  Vital Signs: Patient Vitals for the past 24 hrs:  BP Temp Temp src Pulse Resp SpO2 Height Weight  12/23/21 1034 (!) 164/56 98.1 F (36.7 C) Oral 84 12 96 % -- --  12/23/21 1013 (!) 164/54 98.3 F (36.8 C) Oral 85 (!) 21 97 % -- --  12/23/21 1012 (!) 164/54 98.3 F (36.8 C) Oral 86 20 97 % -- --  12/23/21 0828 133/70 98.9 F (37.2 C) -- 77 (!) 21 96 % -- --  12/23/21 0805 (!) 143/67 99 F (37.2 C) Axillary (!) 155 (!) 23 98 % -- --  12/23/21 0725 -- 100 F (37.8 C) Axillary -- -- -- -- --  12/23/21 0700 (!) 135/42 -- -- 84 19 97 % -- --  12/23/21 0615 -- -- -- 80 20 95 % -- --  12/23/21  0600 (!) 131/52 -- -- 83 20 96 % -- --  12/23/21 0545 -- -- -- 83 17 97 % -- --  12/23/21 0510 (!) 147/42 98.5 F (36.9 C) Oral -- -- -- -- --  12/23/21 0500 113/70 -- -- 83 20 95 % -- --  12/23/21 0445 -- -- -- 85 (!) 21 97 % -- --  12/23/21 0430 -- -- -- 87 19 98 % -- --  12/23/21 0412 -- -- -- -- -- -- -- 98.2 kg  12/23/21 0255 (!) 133/47 98.4 F (36.9 C) Oral 83 (!) 22 -- -- --  12/23/21 0240 (!) 120/44 98.2 F (36.8 C) Oral 82 (!) 25 -- -- --  12/23/21 0230 -- -- -- 84 (!) 22  98 % -- --  12/23/21 0100 (!) 123/41 -- -- 82 (!) 24 95 % -- --  12/23/21 0030 -- -- -- 83 (!) 23 94 % -- --  12/23/21 0015 -- -- -- 85 (!) 24 94 % -- --  12/23/21 0000 (!) 130/36 -- -- 84 (!) 24 93 % -- --  12/22/21 2345 -- -- -- 86 (!) 24 93 % -- --  12/22/21 2339 -- 99.1 F (37.3 C) Oral -- -- -- -- --  12/22/21 2300 (!) 125/50 -- -- 79 (!) 24 96 % -- --  12/22/21 2200 107/76 -- -- 87 (!) 26 95 % -- --  12/22/21 2100 (!) 144/48 -- -- 86 (!) 22 98 % -- --  12/22/21 2035 (!) 129/95 98.6 F (37 C) Oral 90 13 98 % 6' (1.829 m) 100.3 kg  12/22/21 2020 (!) 142/54 99.5 F (37.5 C) Oral 90 (!) 24 96 % -- --  12/22/21 1845 (!) 138/52 98.1 F (36.7 C) Oral 87 18 98 % -- --  12/22/21 1746 (!) 120/54 -- -- -- -- -- -- --  12/22/21 1612 -- -- -- -- -- 99 % -- --  12/22/21 1600 (!) 117/53 97.7 F (36.5 C) Oral 87 18 99 % -- --  12/22/21 1536 (!) 128/55 98 F (36.7 C) Oral 95 20 100 % -- --  12/22/21 1435 123/66 -- -- 95 18 -- -- --  12/22/21 1354 107/64 99.7 F (37.6 C) Oral (!) 159 -- 99 % -- --  12/22/21 1209 (!) 149/58 97.7 F (36.5 C) Oral 97 16 96 % -- --    Radiology: CT ABDOMEN PELVIS WO CONTRAST  Result Date: 12/20/2021 CLINICAL DATA:  Acute abdominal pain EXAM: CT ABDOMEN AND PELVIS WITHOUT CONTRAST TECHNIQUE: Multidetector CT imaging of the abdomen and pelvis was performed following the standard protocol without IV contrast. RADIATION DOSE REDUCTION: This exam was performed according to the  departmental dose-optimization program which includes automated exposure control, adjustment of the mA and/or kV according to patient size and/or use of iterative reconstruction technique. COMPARISON:  MRI abdomen dated 12/20/2021. Right upper quadrant ultrasound dated 12/18/2021. FINDINGS: Lower chest: Mild left basilar atelectasis. Hepatobiliary: Macronodular hepatic contour raises the possibility of cirrhosis. No focal hepatic lesion on unenhanced CT. Cholelithiasis with gallbladder distension and pericholecystic inflammatory changes, reflecting acute cholecystitis. Secondary inflammatory changes along the serosal aspect of the colon at the hepatic flexure (series 2/image 36). Localized perforation with fistulous communication is suggested on MR but is poorly evaluated on unenhanced CT (series 2/image 35). No intrahepatic or extrahepatic ductal dilatation. Pancreas: Within normal limits. Spleen: Mildly enlarged, measuring 16.4 cm in maximal craniocaudal dimension. Adrenals/Urinary Tract: Adrenal glands are within normal limits. Kidneys are within normal limits. No renal, ureteral, or bladder calculi. No hydronephrosis. Bladder is mildly thick-walled although underdistended. Stomach/Bowel: Stomach is within normal limits. No evidence of bowel obstruction. Normal appendix (series 2/image 61). Serosal wall thickening/inflammatory changes along the hepatic flexure, as described above. Mild left colonic diverticulosis, without evidence of diverticulitis. Vascular/Lymphatic: No evidence of abdominal aortic aneurysm. Mild perigastric varices. Atherosclerotic calcifications of the abdominal aorta and branch vessels. No suspicious abdominopelvic lymphadenopathy. Reproductive: Prostate is unremarkable. Other: No abdominopelvic ascites. No free air. Musculoskeletal: Degenerative changes of the visualized thoracolumbar spine. Mild superior endplate changes at L2, likely chronic. IMPRESSION: Cholelithiasis with acute  cholecystitis. Suspected localized perforation with secondary inflammatory changes involving the hepatic flexure of the colon, better evaluated on recent MRI. Surgical consultation is suggested. Secondary  inflammatory changes along the serosal aspect of the colon at the hepatic flexure. Localized perforation with fistulous communication is suggested on MR but is poorly evaluated on unenhanced CT. Suspected cirrhosis. No focal hepatic lesion on unenhanced CT. Mild splenomegaly and perigastric varices. These results will be called to the ordering clinician or representative by the Radiologist Assistant, and communication documented in the PACS or [employer]. Electronically Signed   By: Julian Hy M.D.   On: 12/20/2021 17:16   DG Abd 1 View  Result Date: 12/18/2021 CLINICAL DATA:  Enteric catheter placement EXAM: ABDOMEN - 1 VIEW COMPARISON:  None Available. FINDINGS: Frontal view of the lower chest and upper abdomen demonstrates a weighted tip of an enteric feeding catheter projecting over the gastric antrum. Bowel gas pattern is unremarkable. IMPRESSION: 1. Enteric catheter tip projecting over the gastric antrum. Electronically Signed   By: Randa Ngo M.D.   On: 12/18/2021 19:25   CT HEAD WO CONTRAST (5MM)  Result Date: 12/18/2021 CLINICAL DATA:  Altered mental status EXAM: CT HEAD WITHOUT CONTRAST TECHNIQUE: Contiguous axial images were obtained from the base of the skull through the vertex without intravenous contrast. RADIATION DOSE REDUCTION: This exam was performed according to the departmental dose-optimization program which includes automated exposure control, adjustment of the mA and/or kV according to patient size and/or use of iterative reconstruction technique. COMPARISON:  12/21/2019 FINDINGS: Brain: No evidence of acute infarction, hemorrhage, cerebral edema, mass, mass effect, or midline shift. No hydrocephalus or extra-axial fluid collection. Vascular: No hyperdense vessel.  Skull: Normal. Negative for fracture or focal lesion. Sinuses/Orbits: No acute finding. Other: The mastoid air cells are well aerated. IMPRESSION: No acute intracranial process. Electronically Signed   By: Merilyn Baba M.D.   On: 12/18/2021 21:43   CT ABDOMEN PELVIS W CONTRAST  Result Date: 12/22/2021 CLINICAL DATA:  Septic shock.  Liver lesion. EXAM: CT ABDOMEN AND PELVIS WITH CONTRAST TECHNIQUE: Multidetector CT imaging of the abdomen and pelvis was performed using the standard protocol following bolus administration of intravenous contrast. RADIATION DOSE REDUCTION: This exam was performed according to the departmental dose-optimization program which includes automated exposure control, adjustment of the mA and/or kV according to patient size and/or use of iterative reconstruction technique. CONTRAST:  139m OMNIPAQUE IOHEXOL 300 MG/ML  SOLN COMPARISON:  CT AP from 12/20/2021. FINDINGS: Lower chest: Mild dependent changes identified within the lung bases. Hepatobiliary: Suspected cirrhosis. Hypertrophy of the lateral segment of left hepatic lobe and caudate lobe of liver noted. No focal liver lesion is identified. However, multiphase CT or MRI is the study of choice for evaluating hepatocellular carcinoma. Normal portal venous phase CT does not exclude the possibility of underlying HCC. Multiple gallstones are again noted within the gallbladder. Gas is now seen within the lumen of the gallbladder. There is gallbladder wall thickening with surrounding inflammatory fat stranding. Secondary inflammation with wall thickening of the hepatic flexure is noted. There is enteric contrast material within the inflamed segment of the colon. No signs of patent contrast opacified fistula with the gallbladder. No signs of bile duct dilatation. Pancreas: Unremarkable. No pancreatic ductal dilatation or surrounding inflammatory changes. Spleen: There is a wedge-shaped area of hypoenhancement involving the anterior spleen  compatible with splenic infarct, image 33/2. Adrenals/Urinary Tract: Normal adrenal glands. No kidney mass or hydronephrosis identified bilaterally. Urinary bladder is unremarkable. Stomach/Bowel: Stomach appears normal. Increase caliber of the duodenum within the left hemiabdomen is noted favoring focal ileus. Focal wall thickening involving the hepatic flexure is identified where  it abuts the inflamed gallbladder. Vascular/Lymphatic: Aortic atherosclerosis without aneurysm. No abdominopelvic adenopathy. Reproductive: Prostate is unremarkable. Other: Small volume of free fluid noted within the pelvis. No discrete fluid collection to suggest abscess. Musculoskeletal: Limited imaging through the lower extremity show marked asymmetric swelling of the medial musculature of the left thigh with gas tracking throughout the muscle and surrounding overlying fat stranding. Previous ORIF of the right femur. Unchanged superior endplate compression fracture involving the L2 vertebra. IMPRESSION: 1. Imaging findings compatible with acute cholecystitis. Gas is now identified within the lumen of the gallbladder. Diagnosis of exclusion would be acute emphysematous cholecystitis. Alternatively, presence of gas within the gallbladder lumen may reflect cholecystocolonic fistula although there is no patent contrast opacifying fistula identified on the current exam (with sufficient contrast material noted within the adjacent inflamed loop of colon). 2. Acute splenic infarct. 3. Marked asymmetric swelling of the medial musculature of the left thigh with gas tracking throughout the muscle with surrounding fat stranding. Cannot exclude necrotizing fasciitis. 4. Increase caliber of the duodenum within the left hemiabdomen favoring focal ileus. 5. Cirrhosis. No mass identified within the liver to suggest underlying malignancy. Certainly there is no lesion within the liver which should impact the patient's current acute clinical situation.  Ideally, in a patient that is at increased risk for hepatocellular carcinoma the study of choice for screening [place] would be an outpatient, multiphase MRI. This should be performed only when the patient is able to remain motionless and participate in breath hold technique. Alternatively, outpatient multiphase liver CT would be recommended. 6. Aortic Atherosclerosis (ICD10-I70.0). Critical Value/emergent results were called by telephone at the time of interpretation on 12/22/2021 at 7:36 pm to provider Rufina Falco, NP , who verbally acknowledged these results. Electronically Signed   By: Kerby Moors M.D.   On: 12/22/2021 19:37   MR LIVER WO CONRTAST  Result Date: 12/20/2021 CLINICAL DATA:  Evaluate for hepatocellular carcinoma. EXAM: MRI ABDOMEN WITHOUT CONTRAST TECHNIQUE: Multiplanar multisequence MR imaging was performed without the administration of intravenous contrast. COMPARISON:  Abdominal sonogram 12/18/2021 FINDINGS: Lower chest: Atelectasis versus airspace disease noted within the left base. Hepatobiliary: Diffuse low signal throughout the liver on the in-phase sequence which increases in signal intensity on the out of phase sequences suggest iron deposition within the liver. There is hypertrophy of the lateral segment of left hepatic lobe as well as the caudate lobe. Contour the liver has a macro nodular appearance compatible with cirrhosis. Evaluation for underlying mass lesion is significantly limited due to lack of IV contrast material and respiratory motion artifact. Furthermore, patient was unable to breath hold and requested early termination of the exam. Within this limitation no large liver mass identified. Multiple stones are identified within the gallbladder. These all measure around 4 mm. The gallbladder wall appears diffusely edematous with surrounding inflammatory fat stranding. There is a focal fluid collection which extends beyond the normal margins of the anterior wall of the  gallbladder abutting the hepatic flexure, image 25/4 and image 14/6. As mentioned above examination is significantly limited reflecting motion artifact and lack of IV contrast material. No signs of common bile duct or intrahepatic bile duct dilatation. Pancreas:  No acute abnormality. Spleen:  The spleen is enlarged measuring 16.9 cm cranial caudal. Adrenals/Urinary Tract: Normal appearance of the adrenal glands. No kidney mass or signs of hydronephrosis. Stomach/Bowel: No abnormal bowel dilatation identified. Vascular/Lymphatic: No pathologically enlarged lymph nodes identified. No abdominal aortic aneurysm demonstrated. Other: There is free fluid identified within the upper  abdomen extending over the liver dome. Musculoskeletal: Asymmetric increased signal intensity is identified localizing to the visualized portions of the left gluteus musculature, image 46/4. IMPRESSION: 1. Examination is limited due to lack of IV contrast material and respiratory motion artifact. Within this limitation no large liver mass identified. However study is not diagnostic for excluding 8 cc in a patient who is at increased risk. 2. Morphologic features of the liver compatible with cirrhosis with stigmata of portal venous hypertension including splenomegaly and ascites. 3. Gallstones with diffuse gallbladder wall thickening and surrounding inflammatory fat stranding. A small volume of free fluid is identified extending over the dome of liver. There is a focal fluid collection which extends beyond the normal margins of the anterior wall of the gallbladder abutting the hepatic flexure. This may represent a small abscess or focal perforation of the gallbladder. Cholecystocolonic fistula is also a potential consideration. Further evaluation with CT of the abdomen pelvis is recommended. 4. Asymmetric increased signal intensity identified localizing to the visualized portions of the left gluteus musculature. This is only partially visualized  and is of uncertain significance. Attention on follow-up CT is advised. 5. Critical Value/emergent results were called by telephone at the time of interpretation on 12/20/2021 at 11:45 am to provider [facility] , who verbally acknowledged these results. Electronically Signed   By: Kerby Moors M.D.   On: 12/20/2021 11:46   DG CHEST PORT 1 VIEW  Result Date: 12/23/2021 CLINICAL DATA:  Aspiration precautions. EXAM: PORTABLE CHEST 1 VIEW COMPARISON:  Chest radiograph Dec 18, 2021. FINDINGS: Low lung volumes. No consolidation. No visible pleural effusions or pneumothorax. Similar cardiomediastinal silhouette. IMPRESSION: Low lung volumes without evidence of acute cardiopulmonary disease. Electronically Signed   By: Margaretha Sheffield M.D.   On: 12/23/2021 09:56   DG CHEST PORT 1 VIEW  Result Date: 12/18/2021 CLINICAL DATA:  Central line placement EXAM: PORTABLE CHEST 1 VIEW COMPARISON:  12/18/2021 FINDINGS: Right-sided central venous catheter tip over the SVC. No pneumothorax. No focal opacity, or pleural effusion. Stable borderline cardiomegaly. IMPRESSION: 1. Right IJ central venous catheter tip over the SVC. No pneumothorax 2. Clear lung fields Electronically Signed   By: Donavan Foil M.D.   On: 12/18/2021 16:56   DG Chest Port 1 View  Result Date: 12/18/2021 CLINICAL DATA:  Questionable sepsis. EXAM: PORTABLE CHEST 1 VIEW COMPARISON:  Dec 21, 2019 FINDINGS: Likely projectional prominence of the cardiac silhouette and vascular pedicle. Aortic atherosclerosis. No focal airspace consolidation. No visible pleural effusion or pneumothorax. No acute abnormality. IMPRESSION: No focal airspace consolidation or visible pleural effusion. Electronically Signed   By: Dahlia Bailiff M.D.   On: 12/18/2021 12:06   VAS [place] ABI WITH/WO TBI  Result Date: 12/21/2021  LOWER EXTREMITY DOPPLER STUDY Patient Name:  RONNY KORFF.  Date of Exam:   12/21/2021 Medical Rec #: [id number]             Accession #:     [id number] Date of Birth: 04-Jun-1951             Patient Gender: M Patient Age:   64 years Exam Location:  [facility] Procedure:      VAS [place] ABI WITH/WO TBI Referring Phys: TAYE GONFA --------------------------------------------------------------------------------  Indications: Peripheral artery disease, and weakness/numbness. High Risk Factors: Hypertension, hyperlipidemia, no history of smoking.  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 Comparison Study: No previous exams Performing Technologist: Hill, Jody RVT, RDMS  Examination Guidelines: A complete evaluation includes at minimum,  Doppler waveform signals and systolic blood pressure reading at the level of bilateral brachial, anterior tibial, and posterior tibial arteries, when vessel segments are accessible. Bilateral testing is considered an integral part of a complete examination. Photoelectric Plethysmograph (PPG) waveforms and toe systolic pressure readings are included as required and additional duplex testing as needed. Limited examinations for reoccurring indications may be performed as noted.  ABI Findings: +--------+------------------+-----+---------+--------+ Right   Rt Pressure (mmHg)IndexWaveform Comment  +--------+------------------+-----+---------+--------+ [id number]                    triphasic         +--------+------------------+-----+---------+--------+ +---------+------------------+-----+-------------------+-------+ Left     Lt Pressure (mmHg)IndexWaveform           Comment +---------+------------------+-----+-------------------+-------+ Brachial 138                    triphasic                  +---------+------------------+-----+-------------------+-------+ PTA      43                0.29 dampened monophasic        +---------+------------------+-----+-------------------+-------+ DP       47                0.32 dampened monophasic         +---------+------------------+-----+-------------------+-------+ Great Toe                       Absent                     +---------+------------------+-----+-------------------+-------+ +-------+-----------+-----------+------------+------------+ ABI/TBIToday's ABIToday's TBIPrevious ABIPrevious TBI +-------+-----------+-----------+------------+------------+ Left   0.32       absent                              +-------+-----------+-----------+------------+------------+  RT AKA.  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 The left toe-brachial index is absent. *See table(s) above for measurements and observations.  Vascular consult recommended. Electronically signed by Jamelle Haring on 12/21/2021 at 4:50:57 PM.    Final    ECHOCARDIOGRAM COMPLETE  Result Date: 12/19/2021    ECHOCARDIOGRAM REPORT   Patient Name:   Dario Yono Huesman [name]. Date of Exam: 12/19/2021 Medical Rec #:  [id number]            Height:       72.0 in Accession #:    [id number]           Weight:       196.2 lb Date of Birth:  [birth year]            BSA:          2.113 m Patient Age:    29 years             BP:           137/76 mmHg Patient Gender: M                    HR:           89 bpm. Exam Location:  Inpatient Procedure: 2D Echo, Cardiac Doppler and Color Doppler Indications:    Elevated troponin  History:        Patient has prior history of Echocardiogram examinations, most  recent 04/09/2018. Risk Factors:Hypertension.  Sonographer:    Jefferey Pica Referring Phys: [id number] [place]  1. Left ventricular ejection fraction, by estimation, is 55 to 60%. The left ventricle has normal function. The left ventricle demonstrates regional wall motion abnormalities (basal septal hypokinesis). There is moderate concentric left ventricular hypertrophy. Left ventricular diastolic parameters are consistent with Grade I diastolic dysfunction  (impaired relaxation). Qualitatively LV appears slightly underfilled.  2. Right ventricular systolic function is normal. The right ventricular size is normal. Tricuspid regurgitation signal is inadequate for assessing PA pressure.  3. The mitral valve is normal in structure. No evidence of mitral valve regurgitation. No evidence of mitral stenosis.  4. The aortic valve was not well visualized. There is mild calcification of the aortic valve. Aortic valve regurgitation is not visualized.  5. The inferior vena cava is normal in size with greater than 50% respiratory variability, suggesting right atrial pressure of 3 mmHg. Comparison(s): Prior images reviewed side by side. Wall motion abnormalities are new from prior: prior study was not performed off-axis. LV size was normal in prior study. FINDINGS  Left Ventricle: Left ventricular ejection fraction, by estimation, is 55 to 60%. The left ventricle has normal function. The left ventricle demonstrates regional wall motion abnormalities. The left ventricular internal cavity size was normal in size. There is moderate concentric left ventricular hypertrophy. Left ventricular diastolic parameters are consistent with Grade I diastolic dysfunction (impaired relaxation).  LV Wall Scoring: The basal anteroseptal segment, mid inferoseptal segment, and basal inferoseptal segment are hypokinetic. Right Ventricle: The right ventricular size is normal. No increase in right ventricular wall thickness. Right ventricular systolic function is normal. Tricuspid regurgitation signal is inadequate for assessing PA pressure. Left Atrium: Left atrial size was normal in size. Right Atrium: Right atrial size was normal in size. Pericardium: Trivial pericardial effusion is present. Presence of epicardial fat layer. Mitral Valve: The mitral valve is normal in structure. No evidence of mitral valve regurgitation. No evidence of mitral valve stenosis. Tricuspid Valve: The tricuspid valve is  normal in structure. Tricuspid valve regurgitation is not demonstrated. No evidence of tricuspid stenosis. Aortic Valve: The aortic valve was not well visualized. There is mild calcification of the aortic valve. Aortic valve regurgitation is not visualized. Aortic valve peak gradient measures 8.5 mmHg. Pulmonic Valve: The pulmonic valve was not well visualized. Pulmonic valve regurgitation is not visualized. Aorta: The aortic root and ascending aorta are structurally normal, with no evidence of dilitation. Venous: The inferior vena cava is normal in size with greater than 50% respiratory variability, suggesting right atrial pressure of 3 mmHg. IAS/Shunts: No atrial level shunt detected by color flow Doppler.  LEFT VENTRICLE PLAX 2D LVIDd:         5.10 cm LVIDs:         3.30 cm LV PW:         1.50 cm LV IVS:        1.50 cm LVOT diam:     2.10 cm LV SV:         51 LV SV Index:   24 LVOT Area:     3.46 cm  IVC IVC diam: 1.80 cm LEFT ATRIUM           Index        RIGHT ATRIUM           Index LA diam:      3.30 cm 1.56 cm/m   RA Area:     16.60 cm  LA Vol (A4C): 57.0 ml 26.97 ml/m  RA Volume:   48.90 ml  23.14 ml/m  AORTIC VALVE                 PULMONIC VALVE AV Area (Vmax): 2.28 cm     PV Vmax:       1.06 m/s AV Vmax:        146.00 cm/s  PV Peak grad:  4.5 mmHg AV Peak Grad:   8.5 mmHg LVOT Vmax:      96.00 cm/s LVOT Vmean:     55.600 cm/s LVOT VTI:       0.146 m  AORTA Ao Root diam: 3.70 cm Ao Asc diam:  3.40 cm MITRAL VALVE MV Area (PHT): 3.63 cm    SHUNTS MV Decel Time: 209 msec    Systemic VTI:  0.15 m MV E velocity: 44.60 cm/s  Systemic Diam: 2.10 cm MV A velocity: 56.60 cm/s MV E/A ratio:  0.79 Rudean Haskell MD Electronically signed by Rudean Haskell MD Signature Date/Time: 12/19/2021/8:46:06 AM    Final    US Abdomen Limited RUQ (LIVER/GB)  Result Date: 12/18/2021 CLINICAL DATA:  A 71 year old male presents for evaluation of elevated liver function tests. EXAM: ULTRASOUND ABDOMEN LIMITED  RIGHT UPPER QUADRANT COMPARISON:  Imaging from January 25, 2021. FINDINGS: Gallbladder: Gallbladder not well assessed due to limitations with patient positioning and sonographic window. Question of gallbladder wall thickening. The gallbladder is not imaged in its entirety Common bile duct: Diameter: 3.3 mm though not well assessed due to patient positioning. Liver: Heterogeneous hepatic echotexture with lobular hepatic contours. Question of liver mass seen potentially along the LEFT hepatic lobe though not well assessed. Not definitively associated with the liver on some images. Again with limited assessment as this is deeper in the abdomen, not along the superficial hepatic margin. This may measure up to 6.5 x 6.4 x 4.8 cm. Portal vein is patent on color Doppler imaging with normal direction of blood flow towards the liver. Other: Again markedly limited assessment due to difficulty in positioning the patient for the evaluation. IMPRESSION: Very limited evaluation. Gallbladder is not fully assessed with signs of mild thickening along visualized portions. Liver with suspected mass measuring greater than 6 cm. Suggest further evaluation with contrasted CT, if intravenous contrast cannot be administered noncontrast CT would likely be of benefit to exclude acute process and guide further workup/management. Electronically Signed   By: Zetta Bills M.D.   On: 12/18/2021 14:00    Labs: Recent Labs    12/22/21 0825 12/22/21 1933 12/22/21 2345  WBC 4.1  --  4.6  RBC 1.89*  --  2.21*  HCT 18.4* 22.9* 21.1*  PLT 24*  --  25*   Recent Labs    12/22/21 0413 12/22/21 2345  NA 133* 134*  K 3.8 3.8  CL 103 104  CO2 24 25  BUN 22 16  CREATININE 0.61 0.55*  GLUCOSE 164* 147*  CALCIUM 7.6* 7.6*   Recent Labs    12/22/21 0413 12/22/21 2345  INR 1.4* 1.3*    Review of Systems: ROS as detailed in HPI  Physical Exam: Body mass index is 29.36 kg/m.  Physical Exam  Gen: AAOx3, NAD Comfortable at  rest  Left Lower Extremity: Skin intact, mild swelling No erythema or blistering anywhere in LLE No crepitus to palpation No TTP over thigh or rest of LLE Limited motor function to LLE (baseline per pt) SILT over thigh but grossly numb below knee (baseline per  pt) DP, PT 2+ to palp CR < 2s   Assessment and Plan: Ortho consult for left thigh myonecrosis following being found down with rhabdomyloysis in setting of extensive PMH including hepatic cirrhosis and ongoing cholecystitis  -history, exam and imaging reviewed at length with patient, his POA and other family members at bedside. Case also reviewed with Drs. Andrey Campanile and Mannam who are helping manage this patient -exam and clinical picture not currently consistent with deep infection of the left thigh. The patient has resolving pain in this left thigh and likely has myonecrosis secondary to being found down in rhabdo. The patient has also been improving clinically and weaned off pressors at this time -CT from 12/20/21 shows essentially stable findings in the left thigh without interim progression on most recent CT from yesterday -XR of the left thigh ordered to evaluate further -as discussed with the general surgery and medical/critical care teams, the patient is a very poor surgical candidate given his severe thrombocytopenia, liver failure, ongoing cholecystitis as well as general deconditioned state and minimal function of the LLE at baseline -will continue to monitor clinical exam, vitals and labwork at this time -patient, his POA and family understand the critical situation and agree with plan of care    Armond Hang, MD Orthopaedic Surgeon [facility] [phone]

## 2021-12-23 NOTE — Progress Notes (Signed)
NAME:  [name], [name] MRN:  [id number], DOB:  February 02, 1951, LOS: 5 ADMISSION DATE:  12/18/2021, CONSULTATION DATE:  12/23/21 REFERRING MD:  EDP, CHIEF COMPLAINT:  weakness and confusion   History of Present Illness:  [name] is a 71 y.o. 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  History supplemented by pt's son via phone, states that pt's brother also found him confused and on the ground the day before.  He is supposed to be taking daily medication for cirrhosis, but unsure what it is.  Pt lives independently with health aide, but concerns that he recently has not been able to care for himself.   He has history of variceal changes on [place], denies blood in stool or dark stools  On arrival to the ED pt was hypotensive and altered, labs significant for Hgb of 4.0, lactic acid >9, WBC 8k, Ast 495, ALT 107 and bilirubin 8.   He was given IVF, transfusion ordered and started on Levophed peripherally.    Transferred to [facility] 5/24 after pressors weaned.  Received total 4U PRBC since admission. 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 Not a surgical candidate. Managed with IV antibiotics. 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   Significant Hospital Events: Including procedures, antibiotic start and stop dates in addition to other pertinent events   5/22 presented to [facility] altered and hypotensive, admit to [facility] 5/23 off pressors and more oriented this morning, received 1 more unit PRBC's overnight 5/24 Transferred to [facility]. Received total 4U PRBC since admission. 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 Not a surgical candidate. Managed with IV antibiotics. Transfer to hospitalist service 5/27 PCCM reconsulted with worsening cholecystitis, GI bleed  Interim History / Subjective:   Awake, complains of leg pain.  Transfused PRBC, FFP and platelets  Objective   Blood pressure (!) 135/42, pulse 84, temperature 100 F (37.8 C), temperature source Axillary, resp. rate 19, height 6' (1.829 m), weight 98.2 kg, SpO2 97 %.        Intake/Output Summary (Last 24 hours) at 12/23/2021 0751 Last data filed at 12/23/2021 0700 Gross per 24 hour  Intake 2170.73 ml  Output 2350 ml  Net -179.27 ml   Filed Weights   12/22/21 0500 12/22/21 2035 12/23/21 0412  Weight: 100 kg 100.3 kg 98.2 kg     Physical Exam: Gen:      No acute distress HEENT:  EOMI, sclera anicteric Neck:     No masses; no thyromegaly Lungs:    Clear to auscultation bilaterally; normal respiratory effort CV:         Regular rate and rhythm; no murmurs Abd:      + bowel sounds; soft, non-tender; no palpable masses, no distension Ext: Right above-knee amputation Skin:      Warm and  dry; no rash Neuro: alert and oriented x 3 Psych: normal mood and affect   Labs reviewed Significant for sodium 134, BUN/creatinine 16/0.55 AST 377, ALT 205, total bilirubin 1.4 Hemoglobin improved to 7.1, platelets 25  Resolved Hospital Problem list   Shock, suspect combination of hypovolemic from acute on chronic hemolytic anemia and  acutely worsening liver failure AKI Hyperkalemia AGMA  Assessment & Plan:  Acute GI bleed Acute on chronic blood loss anemia History of Non-immune hemolytic anemia Chronic thrombocytopenia and leukopenia Secondary to liver cirrhosis Underwent bone marrow bx 01/2021 with hypercellular bone marrow with erythroid proliferation, suspected secondary to liver disease, currently being monitored Transfuse PRBCs, continue PPI GI has been consulted by primary team  Acute cholecystitis CT scan from yesterday reviewed with worsening cholecystitis, now with gas in the lumen of gallbladder. He is not an operative candidate for surgery due to multiple comorbidities and cirrhosis, low platelets IR will place a percutaneous drain today Monitor labs Discontinue ceftriaxone and start Vanco, cefepime.  MRSA PCR during this admission is positive Continue Flagyl Check blood cultures and lactic acid  Left thigh swelling CT noted with edema and tracking air. Orthopedics has been consulted. Change antibiotics as above.  Not sure if he is a great surgical candidate  Goals of care Discussed with patient and 2 sons, brother at bedside on 5/27.  They are aware of the seriousness of his illness.  They are ready to meet with palliative care who already has been consulted for pain management and goals of care discussions Family wants to maintain full code for now but would not want prolonged life support.  Best Practice (right click and "Reselect all SmartList Selections" daily)   Diet/type: NPO  DVT prophylaxis: SCD GI prophylaxis: PPI Lines: N/A Foley:  N/A Code Status:  full code Last date of multidisciplinary goals of care discussion '[ ]'$  as above  Critical care time:    The patient is critically ill with multiple organ system failure and requires high complexity decision making for assessment and support, frequent evaluation and titration of therapies, advanced monitoring, review of radiographic  studies and interpretation of complex data.   Critical Care Time devoted to patient care services, exclusive of separately billable procedures, described in this note is 35 minutes.   Marshell Garfinkel MD [facility] See Amion for pager  If no response to pager , please call [phone] until 7pm After 7:00 pm call Elink  [phone] 12/23/2021, 7:51 AM

## 2021-12-23 NOTE — Consult Note (Signed)
Consultation Note Date: 12/23/2021   Patient Name: [name].  DOB: [birth year]  MRN: [id number]  Age / Sex: 71 y.o., male  PCP: [name], FNP Referring Physician: Mercy Riding, MD  Reason for Consultation: Establishing goals of care  HPI/Patient Profile: 71 y.o. male    admitted on 12/18/2021   71 year old M with PMH of EtOH cirrhosis, nonimmune hemolytic anemia, pancytopenia, right AKA and hepatitis C brought to ED with altered mental status after he slid off his bed and found down on the ground by family, and admitted to ICU with hypovolemic shock, hemolytic anemia, liver failure, encephalopathy, AKI and hyperkalemia.  In ED, hypotensive and altered.  Hgb 4.0.  Lactic acid> 9.  AST 495.  ALT 107.  Bili 8.  He was started on Levophed, IV antibiotics and blood transfusion.  Hematology consulted.   RUQ ultrasound concerning for liver cirrhosis and liver mass.    Patient came off Levophed and transferred to [facility] hospitalist service on 5/24.     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  In addition, iver cirrhosis, portal HTN, splenomegaly, ascites. Imaging showed. General surgery consulted and recommended conservative care with IV antibiotics.    6/26-patient was on IV fluid for AKI and rhabdomyolysis but developed significant scrotal edema.  AKI resolved.  IV fluid discontinued.  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  He also had bloody stool later in the day.  Patient was moved to ICU.  PCCM, general  surgery and IR consulted.   6/27-transfused a unit of FFP and 2 units of PRBC.  Plan for [facility] drain for cholecystitis.  Orthopedic surgery and GI consulted.  Clinical Assessment and Goals of Care: Palliative medicine has been consulted for ongoing CODE STATUS and goals of care discussions.  Patient has just returned from having drain placed by IR.  He is awake alert, in good spirits however complains of scrotal swelling and discomfort.  Also admits to back discomfort.  I met with both of his sons.  Family meeting outside the patient's room was conducted. Palliative medicine is specialized medical care for people living with serious illness. It focuses on providing relief from the symptoms and stress of a serious illness. The goal is to improve quality of life for both the patient and the family. Goals of care: Broad aims of medical therapy in relation to the patient's values and preferences. Our aim is to provide medical care aimed at enabling patients to achieve the goals that matter most to them, given the circumstances of their particular medical situation and their constraints.  Brief life review performed.  Broad goals and wishes discussed.  Discussed about how this hospitalization is going.  Goals  wishes and values attempted to be explored.  See recommendations below.  NEXT OF KIN 2 sons were both present at the bedside.  SUMMARY OF RECOMMENDATIONS   Full code/full scope of care for now, monitor hospital course and overall disease trajectory of illness.  Optimize pain and on pain symptom management.  Patient and family hopeful for SNF rehab with palliative towards the end of this hospitalization.  Code Status/Advance Care Planning: Full code   Symptom Management:     Palliative Prophylaxis:  Frequent Pain Assessment  Additional Recommendations (Limitations, Scope, Preferences): Full Scope Treatment  Psycho-social/Spiritual:  Desire for further Chaplaincy support:yes Additional  Recommendations: Caregiving  Support/Resources  Prognosis:  Unable to determine  Discharge Planning: To Be Determined      Primary Diagnoses: Present on Admission:  Chronic pain syndrome   I have reviewed the medical record, interviewed the patient and family, and examined the patient. The following aspects are pertinent.  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   Squamous cell carcinoma of skin 05/21/2018   R mid volar forearm   Thyroid disease    UROLITHIASIS, HX OF 12/23/2007   Social History   Socioeconomic History   Marital status: Widowed    Spouse name: Not on file   Number of children: Not on file   Years of education: Not on file   Highest education level: Not on file  Occupational History   Occupation: Disabled  Tobacco Use   Smoking status: Never   Smokeless tobacco: Never  Substance and Sexual Activity   Alcohol use: Not Currently    Alcohol/week: 0.0 standard drinks   Drug use: No   Sexual activity: Not on file  Other Topics Concern   Not on file  Social History Narrative   Widowed 1997   Lives alone   Lost a son in his arms in 16.    3 sons   Social Determinants of Radio broadcast assistant Strain: Not on file  Food Insecurity: Not on file  Transportation Needs: Not on file  Physical Activity: Not on file  Stress: Not on file  Social Connections: Not on file   Family History  Problem Relation Age of Onset   Heart disease Mother    Lung cancer Mother 53   Heart attack Paternal  Grandmother    Heart attack Paternal Grandfather    Liver cancer Neg Hx    Scheduled Meds:  (feeding supplement) PROSource Plus  30 mL Oral Daily   Chlorhexidine Gluconate Cloth  6 each Topical Q0600   feeding supplement  1 Container Oral BID BM   feeding supplement  237 mL Oral Q24H   levothyroxine  50 mcg Oral Q0600   mouth rinse  15 mL Mouth Rinse BID   multivitamin with minerals  1 tablet Oral Daily   mupirocin ointment  1 application. Nasal BID   pantoprazole (PROTONIX) IV  40 mg Intravenous Q12H   sodium chloride flush  5 mL  Intracatheter Q8H   Continuous Infusions:  sodium chloride Stopped (12/23/21 0547)   ceFEPime (MAXIPIME) IV Stopped (12/23/21 1022)   metronidazole Stopped (12/23/21 1033)   vancomycin Stopped (12/23/21 1351)   PRN Meds:.sodium chloride, acetaminophen, docusate, HYDROmorphone (DILAUDID) injection, LORazepam, metoprolol tartrate, oxyCODONE, polyethylene glycol Medications Prior to Admission:  Prior to Admission medications   Medication Sig Start Date End Date Taking? Authorizing Provider  ALPRAZolam Duanne Moron) 1 MG tablet Take 1 mg by mouth 3 (three) times daily.   Yes [provider]  Cholecalciferol (VITAMIN D3) 10 MCG (400 UNIT) CAPS Take 4 capsules by mouth daily.   Yes [provider]  folic acid (FOLVITE) 1 MG tablet TAKE 1 TABLET(1 MG) BY MOUTH DAILY Patient taking differently: Take 1 mg by mouth daily. 10/12/21  Yes Burns, Wandra Feinstein, NP  hydrochlorothiazide (HYDRODIURIL) 12.5 MG tablet TAKE 1 TABLET(12.5 MG) BY MOUTH DAILY Patient taking differently: Take 12.5 mg by mouth daily. 10/27/21  Yes [name], FNP  levothyroxine (SYNTHROID) 50 MCG tablet TAKE 1 TABLET(50 MCG) BY MOUTH DAILY BEFORE BREAKFAST Patient taking differently: Take 50 mcg by mouth daily before breakfast. 12/14/21  Yes Arnett, [name], FNP  losartan (COZAAR) 100 MG tablet TAKE 1 TABLET(100 MG) BY MOUTH DAILY Patient taking differently: Take 100 mg by mouth  daily. 11/14/21  Yes Arnett, [name], FNP  lubiprostone (AMITIZA) 24 MCG capsule Take 24 mcg by mouth daily as needed. 08/13/14  Yes [provider]  meloxicam (MOBIC) 15 MG tablet Take 15 mg by mouth every morning. 01/12/21  Yes [provider]  morphine (MS CONTIN) 15 MG 12 hr tablet Take 15 mg by mouth 2 (two) times daily as needed for pain. 04/06/20  Yes [provider]  Oxycodone HCl 20 MG TABS Take 20 mg by mouth 4 (four) times daily. 12/04/21  Yes [provider]  pantoprazole (PROTONIX) 40 MG tablet Take 1 tablet (40 mg total) by mouth daily. 12/18/20 12/18/21 Yes Sharen Hones, MD  vitamin B-12 (CYANOCOBALAMIN) 1000 MCG tablet Take 1 tablet (1,000 mcg total) by mouth daily. 04/09/18  Yes Bettey Costa, MD  NARCAN 4 MG/0.1ML LIQD nasal spray kit Place 1 spray into the nose once. 12/29/18   [provider]   Allergies  Allergen Reactions   Clindamycin Dermatitis   Review of Systems Complains of scrotal swelling and discomfort, complains of back discomfort. Physical Exam Awake alert oriented resting in bed Regular breath sounds Monitor noted Right AKA Now has right upper quadrant drain mild abdominal tenderness No focal deficits  Vital Signs: BP (!) 179/56   Pulse 84   Temp 99.2 F (37.3 C) (Axillary)   Resp (!) 22   Ht 6' (1.829 m)   Wt 98.2 kg   SpO2 98%   BMI 29.36 kg/m  Pain Scale: 0-10   Pain Score: 7    SpO2: SpO2: 98 % O2 Device:SpO2: 98 % O2 Flow Rate: .O2 Flow Rate (L/min): 2 L/min  IO: Intake/output summary:  Intake/Output Summary (Last 24 hours) at 12/23/2021 1440 Last data filed at 12/23/2021 1400 Gross per 24 hour  Intake 2596.27 ml  Output 2300 ml  Net 296.27 ml    LBM: Last BM Date : 12/23/21 Baseline Weight: Weight: 88.8 kg Most recent weight: Weight: 98.2 kg     Palliative Assessment/Data:     Palliative performance scale 50%.  Time In: 1300 Time Out: 1410 Time Total: 70 Greater than 50%  of this time  was spent counseling and coordinating  care related to the above assessment and plan.  Signed by: Loistine Chance, MD   Please contact Palliative Medicine Team phone at [phone] for questions and concerns.  For individual provider: See Shea Evans

## 2021-12-23 NOTE — IPAL (Signed)
  Interdisciplinary Goals of Care Family Meeting   Date carried out: 12/23/2021  Location of the meeting: Conference room  Member's involved: Nurse Practitioner  Durable Power of Attorney or acting medical decision maker: Patient,     Member's involved: NP and Family Member or next of kin [name] (son)   Durable Power of Tour manager:    Discussion:  Advance Care Planning/Goals of Care discussion was performed during the course of treatment to decide on type of care right for this patient following admission to the ICU.   I spoke with patient's son [name] over the phone to discuss goals of care in details following  change in patient's current status. Reviewed patient's imaging, labs, vital and current plan of care and answered all their question.   Discussed prognosis, expected outcome with or without ongoing aggressive treatments and the options for de-escalation of care.   Diagnosis(es): Acute cholecystitis, GI Bleed/Acute Blood loss Anemia, Decompensated Liver Cirrhosis  Prognosis: Guarded Code Status: FULL Disposition: ICU Next Steps:  Family understands the situation. They have consented and agreed to current treatment per patient's wishes included any intervention as deemed appropriate  Family are satisfied with Plan of action and management. All questions answered     Total Time Spent Face to Face addressing advance care planning in the presence of the Patient: 25 minutes       Derek Falco, DNP, FNP-C, AGACNP-BC Acute Care Nurse Practitioner [facility] Pager: [phone] [place] [facility]

## 2021-12-23 NOTE — Assessment & Plan Note (Addendum)
Remains full code with full scope of care.  See discussion on 5/26 and 5/27 -Palliative medicine following

## 2021-12-23 NOTE — Progress Notes (Signed)
   12/23/21 1300  Clinical Encounter Type  Visited With Patient and family together  Visit Type Initial;Psychological support;Spiritual support  Referral From Nurse  Consult/Referral To Chaplain  Recommendations address spiritual needs of family  Spiritual Encounters  Spiritual Needs Sacred text;Prayer;Ritual;Emotional  Stress Factors  Patient Stress Factors Major life changes  Family Stress Factors None identified    Met with [name] and  his two adult sons.  Patient spoke of his life narrative and described hi sdeep faith and belief system which aids his medical challenges.  He spoke of his upbringing as a man of a large family who gained his faith from his Grandmother.  He requested prayer, and chaplain anointed Mr. Marcum with oil and prayed together with family surrounding patient's bed.   No spiritual distress was exhibited.

## 2021-12-23 NOTE — Progress Notes (Signed)
[employer] [facility] Progress Note Patient Name: [name]. DOB: 04-10-1951 MRN: [id number]   Date of Service  12/23/2021  HPI/Events of Note  Patient with cirrhosis of the liver, pancytopenia, GI bleeding, acute blood loss anemia, and AKI, he is awaiting blood  products from outside hospital for transfusion.  eICU Interventions  New Patient Evaluation.        [name] [name] 12/23/2021, 3:06 AM

## 2021-12-23 NOTE — Progress Notes (Signed)
Patient received 2 mg morphine. Patient began to vagal/brady into 30's, after woken by staff he projectile vomited undigested food. E-link aware. CXR ordered.

## 2021-12-23 NOTE — Progress Notes (Signed)
NAME:  [name], [name] MRN:  [id number], DOB:  Dec 17, 1950, LOS: 5 ADMISSION DATE:  12/18/2021, CONSULTATION DATE:  12/23/21 REFERRING MD:  EDP, CHIEF COMPLAINT:  weakness and confusion   History of Present Illness:  [name] is a 71 y.o. 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  History supplemented by pt's son via phone, states that pt's brother also found him confused and on the ground the day before.  He is supposed to be taking daily medication for cirrhosis, but unsure what it is.  Pt lives independently with health aide, but concerns that he recently has not been able to care for himself.   He has history of variceal changes on [place], denies blood in stool or dark stools  On arrival to the ED pt was hypotensive and altered, labs significant for Hgb of 4.0, lactic acid >9, WBC 8k, Ast 495, ALT 107 and bilirubin 8.   He was given IVF, transfusion ordered and started on Levophed peripherally.    Transferred to [facility] 5/24 after pressors weaned.  Received total 4U PRBC since admission. 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 Not a surgical candidate. Managed with IV antibiotics. 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   Significant Hospital Events: Including procedures, antibiotic start and stop dates in addition to other pertinent events   5/22 presented to [facility] altered and hypotensive, admit to [facility] 5/23 off pressors and more oriented this morning, received 1 more unit PRBC's overnight 5/24 Transferred to [place]. Received total 4U PRBC since admission. 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 Not a surgical candidate. Managed with IV antibiotics  Interim History / Subjective:  Awake but confused. Hemodynamically stable. Overnight had large bloody bright red bowel movement mixed with stool. Patient on lactulose  Objective   Blood pressure (!) 123/41, pulse 82, temperature 99.1 F (37.3 C), temperature source Oral, resp. rate (!) 24, height 6' (1.829 m), weight 100.3 kg, SpO2 95 %.        Intake/Output Summary (Last 24 hours) at 12/23/2021 0130 Last data filed at 12/23/2021 0100 Gross per 24 hour  Intake 1414.98 ml  Output 2200 ml  Net -785.02 ml   Filed Weights   12/21/21 0500 12/22/21 0500 12/22/21 2035  Weight: 97.1 kg 100 kg 100.3 kg    General:  ill and jaundiced-appearing elderly M in no acute distress HEENT: MM pink/moist, L pupil coloboma, scleral icterus, R pupils responsive to light Neuro: awake, oriented to person and place, confused to situation, upset and angry CV: s1s2 rrr, no m/r/g PULM:  decreased air movement bilateral bases, on [place], no distress, equal chest rise GI: soft, bsx4 active, non-tender Extremities: warm/dry, R aka edema  Skin: no rashes or lesions  Physical Exam: General: Chronically ill-appearing, no acute distress HENT: [place], AT,  OP clear, MMM Eyes: EOMI, no scleral icterus Respiratory: Clear to auscultation bilaterally.  No crackles, wheezing or rales Cardiovascular: RRR, -M/R/G, no JVD GI: Hyperactive , soft,  nontender Extremities:-Edema,-tenderness Neuro: AAO x4, CNII-XII grossly intact Skin: Intact, no rashes or bruising Psych: Normal mood, normal affect  Labs reviewed Hg 6.7>9.6>7.1>6>7.1. PRBC/FFP/Plt ordered Platelets 25 INR 1.3  Resolved Hospital Problem list   Shock, suspect combination of hypovolemic from acute on chronic hemolytic anemia and acutely worsening liver failure AKI Hyperkalemia AGMA  Assessment & Plan:   Acute Encephalopathy secondary to critical illness CT head without acute findings -Hold lactulose in setting of GI bleed  Lower GI bleed Acute on chronic blood loss anemia History of Non-immune hemolytic anemia Chronic thrombocytopenia and leukopenia Secondary to liver cirrhosis Follows with Dr. Janese Banks, last seen in Jan Underwent bone marrow bx 01/2021 with hypercellular bone marrow with erythroid proliferation, suspected secondary to liver disease, currently being monitored -S/p PRBC x 4U -Additional PRBC, FFP and platelet x 1 ordered -Trend H/H. Transfuse for goal Hg >7 -PPI BID -Primary team discussed with IR and Gen Surgery. Not a surgical candidate. IR will evaluate in AM  Cirrhosis Elevated Transaminases - improving Hyperbilirubinemia - improving Acute cholecystitis New 6cm Liver lesion on [place] -CT A/P per primary team  No indication for pressors at this point. Agree with medical management/supportive care. PCCM will continue to follow for acute changes  Best Practice (right click and "Reselect all SmartList Selections" daily)   Diet/type: NPO  DVT prophylaxis: SCD GI prophylaxis: PPI Lines: N/A Foley:  N/A Code Status:  full code Last date of multidisciplinary goals of care discussion [ ]  per primary  Labs   CBC: Recent Labs  Lab 12/18/21 1124 12/18/21 2207 12/20/21 0223 12/20/21 0801 12/21/21 0244 12/21/21 1253 12/22/21 0825 12/22/21 1933 12/22/21 2345  WBC 8.1   < > 8.6  --  5.7 5.9 4.1  --  4.6  NEUTROABS 6.5  --   --   --   --    --   --   --   --   HGB 4.0*   < > 6.7*   < > 7.1* 7.2* 6.0* 7.6* 7.1*  HCT 12.5*   < > 20.3*   < > 20.8* 22.3* 18.4* 22.9* 21.1*  MCV 105.0*   < > 94.9  --  94.1 97.4 97.4  --  95.5  PLT 78*   < > 31*  --  25* 21* 24*  --  25*   < > = values in this interval not displayed.    Basic Metabolic Panel: Recent Labs  Lab 12/19/21 0621 12/19/21 1030 12/20/21 0223 12/21/21 0244 12/22/21 0413 12/22/21 2345  NA  --  135 134* 134* 133* 134*  K  --  4.5 3.9 4.3 3.8 3.8  CL  --  102 103 104 103 104  CO2  --  21* 21* 24 24 25   GLUCOSE  --  208* 203* 148* 164* 147*  BUN  --  56* 63* 42* 22 16  CREATININE  --  1.70* 1.89* 1.11 0.61 0.55*  CALCIUM  --  7.6* 7.8* 7.9* 7.6* 7.6*  MG 1.8  --  2.3 1.9 1.6* 1.6*  PHOS 4.7*  --   --  3.3 2.6 2.8   GFR: Estimated Creatinine Clearance: 102.4 mL/min (A) (by C-G formula based on SCr of 0.55 mg/dL (L)). Recent Labs  Lab 12/18/21 1222 12/18/21 1545 12/18/21 2207 12/19/21 7035 12/19/21 1151 12/20/21 0223 12/21/21 0244  12/21/21 1253 12/22/21 0825 12/22/21 2345  PROCALCITON  --   --  2.03 3.83  --  3.19  --   --   --   --   WBC  --   --  10.2 6.9   < > 8.6 5.7 5.9 4.1 4.6  LATICACIDVEN >9.0* >9.0*  --  2.6*  --   --   --   --   --   --    < > = values in this interval not displayed.    Liver Function Tests: Recent Labs  Lab 12/19/21 1030 12/20/21 0223 12/21/21 0244 12/22/21 0413 12/22/21 2345  AST 1,436* 678* 402* 426* 377*  ALT 627* 487* 330* 239* 205*  ALKPHOS 69 71 77 74 73  BILITOT 5.6* 3.9* 2.2* 1.4* 1.4*  PROT 5.9* 5.7* 5.4* 4.9* 5.0*  ALBUMIN 2.7* 2.5* 2.3* 1.9* 2.0*   Recent Labs  Lab 12/18/21 1124  LIPASE 30   Recent Labs  Lab 12/18/21 1222 12/21/21 0244 12/22/21 0405 12/22/21 2345  AMMONIA 52* 30 20 36*    ABG    Component Value Date/Time   HCO3 23.5 12/18/2021 2207   O2SAT 80.8 12/18/2021 2207     Coagulation Profile: Recent Labs  Lab 12/18/21 1124 12/21/21 0244 12/22/21 0413 12/22/21 2345  INR  1.8* 1.4* 1.4* 1.3*    Cardiac Enzymes: Recent Labs  Lab 12/20/21 0801 12/21/21 0244 12/22/21 0413 12/22/21 2345  CKTOTAL 4,231* 7,837* 10,683* 8,396*    HbA1C: Hgb A1c MFr Bld  Date/Time Value Ref Range Status  12/20/2021 08:01 AM 4.9 4.8 - 5.6 % Final    Comment:    (NOTE) Pre diabetes:          5.7%-6.4%  Diabetes:              >6.4%  Glycemic control for   <7.0% adults with diabetes   11/13/2019 11:18 AM 3.4 (L) 4.6 - 6.5 % Final    Comment:    Glycemic Control Guidelines for People with Diabetes:Non Diabetic:  <6%Goal of Therapy: <7%Additional Action Suggested:  >8%     CBG: Recent Labs  Lab 12/18/21 1544  GLUCAP 59*    Review of Systems:   Unable to obatin  Past Medical History:  He,  has a past medical history of AKA, RIGHT, HX OF (12/23/2007), ANXIETY (03/24/2007), Chronic pain, CIRRHOSIS (03/24/2007), ERECTILE DYSFUNCTION, 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   Surgical History:   Past Surgical History:  Procedure Laterality Date   ABOVE KNEE LEG AMPUTATION Right    Right femur fracture, 11/1994, resulted in right AKA 2006 after staph infection   COLONOSCOPY N/A 12/16/2020   Procedure: COLONOSCOPY;  Surgeon: Virgel Manifold, MD;  Location: [facility] ENDOSCOPY;  Service: Endoscopy;  Laterality: N/A;   ESOPHAGOGASTRODUODENOSCOPY N/A 12/16/2020   Procedure: ESOPHAGOGASTRODUODENOSCOPY (EGD);  Surgeon: Virgel Manifold, MD;  Location: [facility] ENDOSCOPY;  Service: Endoscopy;  Laterality: N/A;     Social History:   reports that he has never smoked. He has never used smokeless tobacco. He reports that he does not currently use alcohol. He reports that he does not use drugs.   Family History:  His family  history includes Heart attack in his paternal grandfather and paternal grandmother; Heart disease in his mother; Lung cancer (age of onset: 42) in his mother. There is no history  of Liver cancer.   Allergies Allergies  Allergen Reactions   Clindamycin Dermatitis     Home Medications  Prior to Admission medications   Medication Sig Start Date End Date Taking? Authorizing Provider  ALPRAZolam Duanne Moron) 1 MG tablet Take 1 mg by mouth 3 (three) times daily.   Yes [provider]  Cholecalciferol (VITAMIN D3) 10 MCG (400 UNIT) CAPS Take 4 capsules by mouth daily.   Yes [provider]  folic acid (FOLVITE) 1 MG tablet TAKE 1 TABLET(1 MG) BY MOUTH DAILY Patient taking differently: Take 1 mg by mouth daily. 10/12/21  Yes Burns, Wandra Feinstein, NP  hydrochlorothiazide (HYDRODIURIL) 12.5 MG tablet TAKE 1 TABLET(12.5 MG) BY MOUTH DAILY Patient taking differently: Take 12.5 mg by mouth daily. 10/27/21  Yes [name], FNP  levothyroxine (SYNTHROID) 50 MCG tablet TAKE 1 TABLET(50 MCG) BY MOUTH DAILY BEFORE BREAKFAST Patient taking differently: Take 50 mcg by mouth daily before breakfast. 12/14/21  Yes Arnett, [name], FNP  losartan (COZAAR) 100 MG tablet TAKE 1 TABLET(100 MG) BY MOUTH DAILY Patient taking differently: Take 100 mg by mouth daily. 11/14/21  Yes Arnett, [name], FNP  lubiprostone (AMITIZA) 24 MCG capsule Take 24 mcg by mouth daily as needed. 08/13/14  Yes [provider]  meloxicam (MOBIC) 15 MG tablet Take 15 mg by mouth every morning. 01/12/21  Yes [provider]  morphine (MS CONTIN) 15 MG 12 hr tablet Take 15 mg by mouth 2 (two) times daily as needed for pain. 04/06/20  Yes [provider]  Oxycodone HCl 20 MG TABS Take 20 mg by mouth 4 (four) times daily. 12/04/21  Yes [provider]  pantoprazole (PROTONIX) 40 MG tablet Take 1 tablet (40 mg total) by mouth daily. 12/18/20 12/18/21 Yes Sharen Hones, MD  vitamin B-12 (CYANOCOBALAMIN)  1000 MCG tablet Take 1 tablet (1,000 mcg total) by mouth daily. 04/09/18  Yes Bettey Costa, MD  NARCAN 4 MG/0.1ML LIQD nasal spray kit Place 1 spray into the nose once. 12/29/18   [provider]     Critical care time: 45 min     The patient is critically ill with multiple organ systems failure and requires high complexity decision making for assessment and support, frequent evaluation and titration of therapies, application of advanced monitoring technologies and extensive interpretation of multiple databases.   Rodman Pickle, M.D. [facility] Pulmonary/Critical Care Medicine 12/23/2021 1:30 AM   Please see Amion for pager number to reach on-call Pulmonary and Critical Care Team.

## 2021-12-23 NOTE — Consult Note (Signed)
Reason for Consult: Bright red blood per rectum Referring Physician: Hospital team  [name]. is an 71 y.o. male.  HPI: Patient seen and case discussed with hospital team and patient is seen after his procedure in interventional radiology and he is doing well from the procedure and he is not aware of any bleeding or bowel movements and is currently not having any abdominal pain in his hospital computer chart and extensive history and previous work-up was extensively were reviewed including endoscopy colonoscopy and labs and x-rays and currently he has no other complaints  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   Squamous cell carcinoma of skin 05/21/2018   R mid volar forearm   Thyroid disease    UROLITHIASIS, HX OF 12/23/2007    Past Surgical History:  Procedure Laterality Date   ABOVE KNEE LEG AMPUTATION Right    Right femur fracture, 11/1994, resulted in right AKA 2006 after staph infection   COLONOSCOPY N/A 12/16/2020   Procedure: COLONOSCOPY;  Surgeon: Virgel Manifold, MD;  Location: [facility] ENDOSCOPY;  Service: Endoscopy;  Laterality: N/A;   ESOPHAGOGASTRODUODENOSCOPY N/A 12/16/2020   Procedure: ESOPHAGOGASTRODUODENOSCOPY (EGD);  Surgeon: Virgel Manifold, MD;  Location: [facility] ENDOSCOPY;  Service: Endoscopy;  Laterality: N/A;    Family History  Problem Relation Age of Onset   Heart disease Mother    Lung cancer Mother 65   Heart attack Paternal Grandmother    Heart attack Paternal Grandfather    Liver cancer Neg  Hx     Social History:  reports that he has never smoked. He has never used smokeless tobacco. He reports that he does not currently use alcohol. He reports that he does not use drugs.  Allergies:  Allergies  Allergen Reactions   Clindamycin Dermatitis    Medications: I have reviewed the patient's current medications.  Results for orders placed or performed during the hospital encounter of 12/18/21 (from the past 48 hour(s))  CBC     Status: Abnormal   Collection Time: 12/21/21 12:53 PM  Result Value Ref Range   WBC 5.9 4.0 - 10.5 K/uL   RBC 2.29 (L) 4.22 - 5.81 MIL/uL   Hemoglobin 7.2 (L) 13.0 - 17.0 g/dL   HCT 22.3 (L) 39.0 - 52.0 %   MCV 97.4 80.0 - 100.0 fL   MCH 31.4 26.0 - 34.0 pg   MCHC 32.3 30.0 - 36.0 g/dL   RDW 19.1 (H) 11.5 - 15.5 %   Platelets 21 (LL) 150 - 400 K/uL    Comment: Immature Platelet Fraction may be clinically indicated, consider ordering this additional test [id number] CRITICAL VALUE NOTED.  VALUE IS CONSISTENT WITH PREVIOUSLY REPORTED AND CALLED VALUE. REPEATED TO VERIFY    nRBC 0.7 (H) 0.0 - 0.2 %    Comment: Performed at [facility], [place] [address] [place], [place] [address]  Ammonia     Status: None   Collection Time: 12/22/21  4:05 AM  Result Value  Ref Range   Ammonia 20 9 - 35 umol/L    Comment: Performed at [facility], [place] [address], [place], [place] [address]  Comprehensive metabolic panel     Status: Abnormal   Collection Time: 12/22/21  4:13 AM  Result Value Ref Range   Sodium 133 (L) 135 - 145 mmol/L   Potassium 3.8 3.5 - 5.1 mmol/L   Chloride 103 98 - 111 mmol/L   CO2 24 22 - 32 mmol/L   Glucose, Bld 164 (H) 70 - 99 mg/dL    Comment: Glucose reference range applies only to samples taken after fasting for at least 8 hours.   BUN 22 8 - 23 mg/dL   Creatinine, Ser 0.61 0.61 - 1.24 mg/dL   Calcium 7.6 (L) 8.9 - 10.3 mg/dL   Total Protein 4.9 (L) 6.5 - 8.1 g/dL   Albumin 1.9 (L) 3.5 - 5.0  g/dL   AST 426 (H) 15 - 41 U/L   ALT 239 (H) 0 - 44 U/L   Alkaline Phosphatase 74 38 - 126 U/L   Total Bilirubin 1.4 (H) 0.3 - 1.2 mg/dL   GFR, Estimated >60 >60 mL/min    Comment: (NOTE) Calculated using the CKD-EPI Creatinine Equation (2021)    Anion gap 6 5 - 15    Comment: Performed at [facility], [place] [address], [place], [place] [address]  CK     Status: Abnormal   Collection Time: 12/22/21  4:13 AM  Result Value Ref Range   Total CK 10,683 (H) 49 - 397 U/L    Comment: RESULTS CONFIRMED BY MANUAL DILUTION Performed at [facility], [place] [address], [place], [place] [address]   Magnesium     Status: Abnormal   Collection Time: 12/22/21  4:13 AM  Result Value Ref Range   Magnesium 1.6 (L) 1.7 - 2.4 mg/dL    Comment: Performed at [facility], [place] [address], [place], [place] [address]  Phosphorus     Status: None   Collection Time: 12/22/21  4:13 AM  Result Value Ref Range   Phosphorus 2.6 2.5 - 4.6 mg/dL    Comment: Performed at [facility], [place] [address], [place], [place] [address]  Protime-INR     Status: Abnormal   Collection Time: 12/22/21  4:13 AM  Result Value Ref Range   Prothrombin Time 16.6 (H) 11.4 - 15.2 seconds   INR 1.4 (H) 0.8 - 1.2    Comment: (NOTE) INR goal varies based on device and disease states. Performed at [facility], [place] [address] [place], [place] [address]   CBC     Status: Abnormal   Collection Time: 12/22/21  8:25 AM  Result Value Ref Range   WBC 4.1 4.0 - 10.5 K/uL   RBC 1.89 (L) 4.22 - 5.81 MIL/uL   Hemoglobin 6.0 (LL) 13.0 - 17.0 g/dL    Comment: REPEATED TO VERIFY THIS CRITICAL RESULT HAS VERIFIED AND BEEN CALLED TO MAILELE, S. RN BY NICOLE MCCOY ON 05 26 2023 AT 0931, AND HAS BEEN READ BACK. CRITICAL RESULT VERIFIED    HCT 18.4 (L) 39.0 - 52.0 %   MCV 97.4 80.0 - 100.0 fL   MCH 31.7 26.0 - 34.0 pg   MCHC 32.6 30.0 - 36.0 g/dL    RDW 18.8 (H) 11.5 - 15.5 %   Platelets 24 (LL) 150 - 400 K/uL    Comment: Immature Platelet Fraction may be clinically  indicated, consider ordering this additional test [id number] CRITICAL VALUE NOTED.  VALUE IS CONSISTENT WITH PREVIOUSLY REPORTED AND CALLED VALUE. REPEATED TO VERIFY    nRBC 0.5 (H) 0.0 - 0.2 %    Comment: Performed at [facility], [place] [address], [place], [place] [address]  Prepare RBC (crossmatch)     Status: None   Collection Time: 12/22/21 10:03 AM  Result Value Ref Range   Order Confirmation      ORDER PROCESSED BY BLOOD BANK Performed at [facility], [place] [address], [place], [place] [address]   Type and screen [place]     Status: None (Preliminary result)   Collection Time: 12/22/21 11:20 AM  Result Value Ref Range   ABO/RH(D) B NEG    Antibody Screen POS    Sample Expiration 12/25/2021,2359    Antibody Identification ANTI K ANTI JKA (Kidd a)    Unit Number [id number]    Blood Component Type RED CELLS,LR    Unit division 00    Status of Unit ISSUED    Donor AG Type      NEGATIVE FOR KELL ANTIGEN NEGATIVE FOR KIDD A ANTIGEN   Transfusion Status OK TO TRANSFUSE    Crossmatch Result COMPATIBLE    Unit Number [id number]    Blood Component Type RBC LR PHER2    Unit division 00    Status of Unit ISSUED    Donor AG Type      NEGATIVE FOR KELL ANTIGEN NEGATIVE FOR KIDD A ANTIGEN NEGATIVE FOR E ANTIGEN   Transfusion Status OK TO TRANSFUSE    Crossmatch Result COMPATIBLE    Unit Number [id number]    Blood Component Type RED CELLS,LR    Unit division 00    Status of Unit ALLOCATED    Donor AG Type      NEGATIVE FOR KELL ANTIGEN NEGATIVE FOR KIDD A ANTIGEN NEGATIVE FOR E ANTIGEN   Transfusion Status OK TO TRANSFUSE    Crossmatch Result COMPATIBLE   Occult blood card to lab, stool RN will collect     Status: Abnormal   Collection Time: 12/22/21  6:00 PM  Result Value Ref Range   Fecal  Occult Bld POSITIVE (A) NEGATIVE    Comment: Performed at [facility], [place] [address], [place], [place] [address]  Hemoglobin and hematocrit, blood     Status: Abnormal   Collection Time: 12/22/21  7:33 PM  Result Value Ref Range   Hemoglobin 7.6 (L) 13.0 - 17.0 g/dL    Comment: REPEATED TO VERIFY POST TRANSFUSION SPECIMEN    HCT 22.9 (L) 39.0 - 52.0 %    Comment: Performed at [facility], [place] [address], [place], [place] [address]  Comprehensive metabolic panel     Status: Abnormal   Collection Time: 12/22/21 11:45 PM  Result Value Ref Range   Sodium 134 (L) 135 - 145 mmol/L   Potassium 3.8 3.5 - 5.1 mmol/L   Chloride 104 98 - 111 mmol/L   CO2 25 22 - 32 mmol/L   Glucose, Bld 147 (H) 70 - 99 mg/dL    Comment: Glucose reference range applies only to samples taken after fasting for at least 8 hours.   BUN 16 8 - 23 mg/dL   Creatinine, Ser 0.55 (L) 0.61 - 1.24 mg/dL   Calcium 7.6 (L) 8.9 - 10.3 mg/dL   Total Protein 5.0 (L) 6.5 - 8.1 g/dL   Albumin 2.0 (L) 3.5 - 5.0  g/dL   AST 377 (H) 15 - 41 U/L   ALT 205 (H) 0 - 44 U/L   Alkaline Phosphatase 73 38 - 126 U/L   Total Bilirubin 1.4 (H) 0.3 - 1.2 mg/dL   GFR, Estimated >60 >60 mL/min    Comment: (NOTE) Calculated using the CKD-EPI Creatinine Equation (2021)    Anion gap 5 5 - 15    Comment: Performed at [facility], [place] [address], [place], [place] [address]  Magnesium     Status: Abnormal   Collection Time: 12/22/21 11:45 PM  Result Value Ref Range   Magnesium 1.6 (L) 1.7 - 2.4 mg/dL    Comment: Performed at [facility], [place] [address], [place], [place] [address]  Phosphorus     Status: None   Collection Time: 12/22/21 11:45 PM  Result Value Ref Range   Phosphorus 2.8 2.5 - 4.6 mg/dL    Comment: Performed at [facility], [place] [address], [place], [place] [address]  Protime-INR     Status: Abnormal   Collection Time: 12/22/21  11:45 PM  Result Value Ref Range   Prothrombin Time 15.8 (H) 11.4 - 15.2 seconds   INR 1.3 (H) 0.8 - 1.2    Comment: (NOTE) INR goal varies based on device and disease states. Performed at [facility], [place] [address], [place], [place] [address]   Ammonia     Status: Abnormal   Collection Time: 12/22/21 11:45 PM  Result Value Ref Range   Ammonia 36 (H) 9 - 35 umol/L    Comment: Performed at [facility], [place] [address], [place], [place] [address]  CBC     Status: Abnormal   Collection Time: 12/22/21 11:45 PM  Result Value Ref Range   WBC 4.6 4.0 - 10.5 K/uL   RBC 2.21 (L) 4.22 - 5.81 MIL/uL   Hemoglobin 7.1 (L) 13.0 - 17.0 g/dL   HCT 21.1 (L) 39.0 - 52.0 %   MCV 95.5 80.0 - 100.0 fL   MCH 32.1 26.0 - 34.0 pg   MCHC 33.6 30.0 - 36.0 g/dL   RDW 18.6 (H) 11.5 - 15.5 %   Platelets 25 (LL) 150 - 400 K/uL    Comment: Immature Platelet Fraction may be clinically indicated, consider ordering this additional test [id number] CRITICAL VALUE NOTED.  VALUE IS CONSISTENT WITH PREVIOUSLY REPORTED AND CALLED VALUE. REPEATED TO VERIFY    nRBC 0.4 (H) 0.0 - 0.2 %    Comment: Performed at [facility], [place] [address] [place], [place] [address]  CK     Status: Abnormal   Collection Time: 12/22/21 11:45 PM  Result Value Ref Range   Total CK 8,396 (H) 49 - 397 U/L    Comment: RESULTS CONFIRMED BY MANUAL DILUTION Performed at [facility], [place] [address], [place],  [address]   Prepare platelet pheresis     Status: None (Preliminary result)   Collection Time: 12/22/21 11:47 PM  Result Value Ref Range   Unit Number [id number]    Blood Component Type PSORALEN TREATED    Unit division 00    Status of Unit REL FROM [facility]    Transfusion Status OK TO TRANSFUSE    Unit Number [id number]    Blood Component Type PLTP2 PSORALEN TREATED    Unit division 00    Status of Unit ISSUED    Transfusion Status      OK  TO TRANSFUSE Performed  at [facility], [place] [address] [place], [place] [address]   Prepare fresh frozen plasma     Status: None (Preliminary result)   Collection Time: 12/23/21  1:29 AM  Result Value Ref Range   Unit Number [id number]    Blood Component Type THAWED PLASMA    Unit division 00    Status of Unit ISSUED    Transfusion Status      OK TO TRANSFUSE Performed at [place] [address] [place], [place] [address]   Prepare RBC (crossmatch)     Status: None   Collection Time: 12/23/21  1:29 AM  Result Value Ref Range   Order Confirmation      ORDER PROCESSED BY BLOOD BANK Performed at [facility], [place] [address], [place], [place] [address]   Prepare platelet pheresis     Status: None (Preliminary result)   Collection Time: 12/23/21  9:48 AM  Result Value Ref Range   Unit Number [id number]    Blood Component Type PSORALEN TREATED    Unit division 00    Status of Unit ALLOCATED    Transfusion Status OK TO TRANSFUSE     CT ABDOMEN PELVIS W CONTRAST  Result Date: 12/22/2021 CLINICAL DATA:  Septic shock.  Liver lesion. EXAM: CT ABDOMEN AND PELVIS WITH CONTRAST TECHNIQUE: Multidetector CT imaging of the abdomen and pelvis was performed using the standard protocol following bolus administration of intravenous contrast. RADIATION DOSE REDUCTION: This exam was performed according to the departmental dose-optimization program which includes automated exposure control, adjustment of the mA and/or kV according to patient size and/or use of iterative reconstruction technique. CONTRAST:  134m OMNIPAQUE IOHEXOL 300 MG/ML  SOLN COMPARISON:  CT AP from 12/20/2021. FINDINGS: Lower chest: Mild dependent changes identified within the lung bases. Hepatobiliary: Suspected cirrhosis. Hypertrophy of the lateral segment of left hepatic lobe and caudate lobe of liver noted. No focal liver lesion is identified. However, multiphase  CT or MRI is the study of choice for evaluating hepatocellular carcinoma. Normal portal venous phase CT does not exclude the possibility of underlying HCC. Multiple gallstones are again noted within the gallbladder. Gas is now seen within the lumen of the gallbladder. There is gallbladder wall thickening with surrounding inflammatory fat stranding. Secondary inflammation with wall thickening of the hepatic flexure is noted. There is enteric contrast material within the inflamed segment of the colon. No signs of patent contrast opacified fistula with the gallbladder. No signs of bile duct dilatation. Pancreas: Unremarkable. No pancreatic ductal dilatation or surrounding inflammatory changes. Spleen: There is a wedge-shaped area of hypoenhancement involving the anterior spleen compatible with splenic infarct, image 33/2. Adrenals/Urinary Tract: Normal adrenal glands. No kidney mass or hydronephrosis identified bilaterally. Urinary bladder is unremarkable. Stomach/Bowel: Stomach appears normal. Increase caliber of the duodenum within the left hemiabdomen is noted favoring focal ileus. Focal wall thickening involving the hepatic flexure is identified where it abuts the inflamed gallbladder. Vascular/Lymphatic: Aortic atherosclerosis without aneurysm. No abdominopelvic adenopathy. Reproductive: Prostate is unremarkable. Other: Small volume of free fluid noted within the pelvis. No discrete fluid collection to suggest abscess. Musculoskeletal: Limited imaging through the lower extremity show marked asymmetric swelling of the medial musculature of the left thigh with gas tracking throughout the muscle and surrounding overlying fat stranding. Previous ORIF of the right femur. Unchanged superior endplate compression fracture involving the L2 vertebra. IMPRESSION: 1. Imaging findings compatible with acute cholecystitis. Gas is now identified within the lumen of the gallbladder. Diagnosis  of exclusion would be acute  emphysematous cholecystitis. Alternatively, presence of gas within the gallbladder lumen may reflect cholecystocolonic fistula although there is no patent contrast opacifying fistula identified on the current exam (with sufficient contrast material noted within the adjacent inflamed loop of colon). 2. Acute splenic infarct. 3. Marked asymmetric swelling of the medial musculature of the left thigh with gas tracking throughout the muscle with surrounding fat stranding. Cannot exclude necrotizing fasciitis. 4. Increase caliber of the duodenum within the left hemiabdomen favoring focal ileus. 5. Cirrhosis. No mass identified within the liver to suggest underlying malignancy. Certainly there is no lesion within the liver which should impact the patient's current acute clinical situation. Ideally, in a patient that is at increased risk for hepatocellular carcinoma the study of choice for screening [place] would be an outpatient, multiphase MRI. This should be performed only when the patient is able to remain motionless and participate in breath hold technique. Alternatively, outpatient multiphase liver CT would be recommended. 6. Aortic Atherosclerosis (ICD10-I70.0). Critical Value/emergent results were called by telephone at the time of interpretation on 12/22/2021 at 7:36 pm to provider Rufina Falco, NP , who verbally acknowledged these results. Electronically Signed   By: Kerby Moors M.D.   On: 12/22/2021 19:37   DG CHEST PORT 1 VIEW  Result Date: 12/23/2021 CLINICAL DATA:  Aspiration precautions. EXAM: PORTABLE CHEST 1 VIEW COMPARISON:  Chest radiograph Dec 18, 2021. FINDINGS: Low lung volumes. No consolidation. No visible pleural effusions or pneumothorax. Similar cardiomediastinal silhouette. IMPRESSION: Low lung volumes without evidence of acute cardiopulmonary disease. Electronically Signed   By: Margaretha Sheffield M.D.   On: 12/23/2021 09:56    ROS negative except above Blood pressure (!) 150/69, pulse 82,  temperature 98.1 F (36.7 C), temperature source Oral, resp. rate 19, height 6' (1.829 m), weight 98.2 kg, SpO2 99 %. Physical Exam patient lying comfortably in the IR suite no acute distress vital signs stable not examined currently just finished his procedure drain in place Labs and x-rays reviewed  Assessment/Plan: Bright red blood per rectum in patient with multiple medical problems including cirrhosis and low platelet count with stable hemoglobin and only 2 episodes of bright red blood according to the nurse Plan: When okay from a critical care and IR standpoint okay with me to have clear liquids and might consider a nuclear bleeding scan or CTA if bleeding increases otherwise continue critical care management and I will check on tomorrow but does not need an urgent intervention at this time  [facility] E 12/23/2021, 12:18 PM

## 2021-12-23 NOTE — Progress Notes (Signed)
PROGRESS NOTE  Derek Anon Hazell Jr. BOF:[id number] DOB: Sep 06, 1950   PCP: [name], FNP  Patient is from: Home.  Uses wheelchair at baseline.  DOA: 12/18/2021 LOS: 5  Chief complaints Chief Complaint  Patient presents with   Failure To Thrive     Brief Narrative / Interim history: 71 year old M with PMH of EtOH cirrhosis, nonimmune hemolytic anemia, pancytopenia, right AKA and hepatitis C brought to ED with altered mental status after he slid off his bed and found down on the ground by family, and admitted to ICU with hypovolemic shock, hemolytic anemia, liver failure, encephalopathy, AKI and hyperkalemia.  In ED, hypotensive and altered.  Hgb 4.0.  Lactic acid> 9.  AST 495.  ALT 107.  Bili 8.  He was started on Levophed, IV antibiotics and blood transfusion.  Hematology consulted.  RUQ ultrasound concerning for liver cirrhosis and liver mass.   Patient came off Levophed and transferred to [facility] hospitalist service on 5/24.    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  In addition, iver cirrhosis, portal HTN, splenomegaly, ascites. Imaging showed. General surgery consulted and recommended conservative care with IV antibiotics.   6/26-patient was on IV fluid for AKI and rhabdomyolysis but developed significant scrotal edema.  AKI resolved.  IV fluid discontinued.  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  He also had bloody stool later in the day.  Patient was moved to ICU.  PCCM, general surgery and IR consulted.  6/27-transfused a unit of FFP and 2 units of PRBC.  Plan for [facility] drain for  cholecystitis.  Orthopedic surgery and GI consulted.     Subjective: Seen and examined earlier this morning.  Patient was moved to ICU due to concerning CT finding.  Remained hemodynamically stable.  Getting blood transfusion and fresh frozen plasma.  PCCM, general surgery, IR and orthopedic surgery consulted.  He complains pain in his left groin area and left leg.  He has chronic pain.  CT also concerning for some degree of muscle swelling/necrosis with gas but low suspicion for neck fasciitis per surgery and orthopedic surgery.   Objective: Vitals:   12/23/21 0828 12/23/21 1012 12/23/21 1013 12/23/21 1034  BP: 133/70 (!) 164/54 (!) 164/54 (!) 164/56  Pulse: 77 86 85 84  Resp: (!) 21 20 (!) 21 12  Temp: 98.9 F (37.2 C) 98.3 F (36.8 C) 98.3 F (36.8 C) 98.1 F (36.7 C)  TempSrc:  Oral Oral Oral  SpO2: 96% 97% 97% 96%  Weight:      Height:        Examination:  GENERAL: No apparent distress.  Nontoxic. HEENT: MMM.  Vision and hearing grossly intact.  NECK: Supple.  No apparent JVD.  RESP:  No IWOB.  Fair aeration bilaterally. CVS:  RRR. Heart sounds normal.  ABD/GI/GU: BS+. Abd soft.  Mild diffuse tenderness mainly in RUQ.  Significant scrotal edema.  No tenderness. MSK/EXT: Right AKA.  Mild swelling left thigh.  No tenderness or crepitus.  No erythema.  Skin discoloration in left lower leg likely from venous insufficiency/PAD. SKIN: no apparent skin lesion or wound NEURO: Awake and alert. Oriented appropriately.  No apparent focal neuro deficit. PSYCH:  Calm. Normal affect.   Procedures:  None  Microbiology summarized: 5/22-COVID-19 and influenza PCR nonreactive. 5/24-MRSA PCR screen positive. 5/22-blood cultures NGTD 5/22-urine cultures NGTD 5/27-blood culture pending.   Assessment and Plan: * Septic shock ([place]) Tachypneic, hypotensive with AKI, lactic acidosis and thrombocytopenia, and required vasopressors on admission.  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  Blood and urine cultures NGTD.  Hemodynamically stable.  No leukocytosis -PCCM, general surgery, IR and orthopedic surgery following. -Antibiotics broadened to vancomycin, cefepime and Flagyl -Plan for [facility] drain for acute cholecystitis  Acute pain of left thigh CT abdomen and pelvis shows muscular swelling with tracking gas within the proximal muscle compartments.  Imaging did not include distal aspect of his thigh.  He has no significant tenderness erythema but swelling.  He has traumatic rhabdomyolysis.  Low suspicion for neck fasciitis clinically. -Appreciate input by general surgery and orthopedic surgery -Broad-spectrum antibiotics as above -Continue monitoring  SVT (supraventricular tachycardia) (HCC) As symptomatic.  Normotensive.  Resolved with IV metoprolol. -IV metoprolol as needed -Continue telemetry monitoring  Acute metabolic encephalopathy Multifactorial including shock, sepsis, uremia, hepatic encephalopathy or polypharmacy.  Seems to have improved.  He is oriented x4 except time. -Discontinue lactulose in the setting of GI bleed -Treat treatable causes -Minimize sedating medications -Reorientation and delirium precautions.  AKI (acute kidney injury) ([place]) Recent Labs    01/11/21 1518 02/03/21 1037 05/22/21 1037 12/18/21 1124 12/18/21 1751 12/19/21 1030 12/20/21 0223 12/21/21 0244 12/22/21 0413 12/22/21 2345  BUN '18 11 15 '$ 38* 42* 56* 63* 42* 22 16  CREATININE 0.68 0.71 0.54* 1.70* 1.44* 1.70* 1.89* 1.11 0.61 0.55*  Likely due to hypotension and rhabdomyolysis.  Resolved. -Recheck renal function in the morning -Avoid nephrotoxic meds   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 Elevated liver enzymes/hyperbilirubinemia Recent Labs  Lab 12/19/21 1030 12/20/21 0223 12/21/21 0244 12/22/21 0413 12/22/21 2345  AST 1,436* 678* 402* 426*  377*  ALT 627* 487* 330* 239* 205*  ALKPHOS 69 71 77 74 73  BILITOT 5.6* 3.9* 2.2* 1.4* 1.4*  PROT 5.9* 5.7* 5.4* 4.9* 5.0*  ALBUMIN 2.7* 2.5* 2.3* 1.9* 2.0*  LFT pattern consistent rhabdo.  CK elevated to 4231> 7800>> 10,700>> 8400.  He has mild coagulopathy as well. 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 LDH markedly elevated.  Haptoglobin low.  Had a splenomegaly and splenic infarction. Now with GI bleed.  EGD 5/22 with gastritis and duodenitis.  Colonoscopy in 5/22 with colon polyps, diverticulosis and internal hemorrhoid.  He has significant thrombocytopenia and coagulopathy from liver cirrhosis. -Transfused a total of 6 units PRBC. -Transfused FFP -[facility] consulted. -Monitor H&H and transfuse for Hgb <7.0.  Ordered 1 unit.  Splenic infarction Wedge-shaped area of hypoenhancement involving the anterior spleen concerning for splenic infarction noted on CT abdomen and pelvis. -Per general surgery.  Left leg weakness and numbness Patient has left thigh muscle necrosis likely from rhabdomyolysis.  ABI with severe PAD which might be contributing. -Consider vascular consult once stable  -Closely monitor  Hyperkalemia, hyponatremia and hypomagnesemia Hyperkalemia resolved.  Hyponatremia mild and stable. -IV magnesium sulfate 2 g x 1  Traumatic rhabdomyolysis ([place]) Likely traumatic from recent fall.  CT showed muscle swelling/tracking gas in left medial thigh.  CK 4231>> 7835> 10,700> 8400 -Stopped IV fluids due to significant scrotal edema -Continue monitoring  Thrombocytopenia (HCC) Recent Labs  Lab 12/18/21 1124 12/18/21 2207 12/19/21 0621  12/19/21 1151 12/20/21 0223 12/21/21 0244 12/21/21 1253 12/22/21 0825 12/22/21 2345  PLT 78* 58* 40* 38* 31* 25* 21* 24* 25*  Continue monitoring.  Transfuse for platelet < 10k or bleeding   Goals of care, counseling/discussion Patient with significant comorbidity including advanced liver cirrhosis, splenomegaly with splenic infarction, severe anemia, thrombocytopenia and rhabdomyolysis.  Concerning and grim prognosis. His two sons understand about the poor prognosis.  They have even started investigating about hospice.  However, patient wound like to continue "fighting". His sons would like to honor his wish but appreciate palliative medicine consult.  Remains full code pending palliative encounter.  Abnormal MRI, liver-resolved as of 12/23/2021 RUQ [place] concerning for 6 cm liver mass.  Not appreciated on CT abdomen with contrast.  Physical deconditioning Wheelchair-bound at baseline.  He says he is able to transfer using left leg at baseline. -PT/OT recommended SNF -Left leg evaluation as above  Scrotal edema Likely due to IV fluid in the setting of liver cirrhosis. -IV fluid discontinued -Continue monitoring  Elevated troponin Likely demand ischemia in the setting of sepsis and shock  Calculus of bile duct with acute cholecystitis See shock  Chronic pain syndrome Radius oxycodone to 20 mg every 6 hours.  Per database, feels 120 for 30 days Added IV Dilaudid  Pressure skin injury: POA Pressure Injury 12/18/21 Foot Anterior;Left;Lateral Deep Tissue Pressure Injury - Purple or maroon localized area of discolored intact skin or blood-filled blister due to damage of underlying soft tissue from pressure and/or shear. purple, red (Active)  12/18/21 1530  Location: Foot  Location Orientation: Anterior;Left;Lateral  Staging: Deep Tissue Pressure Injury - Purple or maroon localized area of discolored intact skin or blood-filled blister due to damage of underlying soft tissue from  pressure and/or shear.  Wound Description (Comments): purple, red  Present on Admission: Yes  Dressing Type Foam - Lift dressing to assess site every shift 12/23/21 0800   DVT prophylaxis:  SCDs Start: 12/18/21 1421  Code Status: Full code Family Communication: Updated patient's sons and brother at bedside Level of care: ICU Status is: Inpatient Remains inpatient appropriate because: Septic shock, acute cholecystitis, rhabdomyolysis, acute blood loss anemia and GI bleed   Final disposition: SNF? Consultants:  Pulmonology Oncology/hematology General surgery Orthopedic surgery Gastroenterology Interventional radiology Palliative medicine  Sch Meds:  Scheduled Meds:  (feeding supplement) PROSource Plus  30 mL Oral Daily   sodium chloride   Intravenous Once   Chlorhexidine Gluconate Cloth  6 each Topical Q0600   feeding supplement  1 Container Oral BID BM   feeding supplement  237 mL Oral Q24H   fentaNYL       levothyroxine  50 mcg Oral Q0600   lidocaine       mouth rinse  15 mL Mouth Rinse BID   midazolam       multivitamin with minerals  1 tablet Oral Daily   mupirocin ointment  1 application. Nasal BID   pantoprazole (PROTONIX) IV  40 mg Intravenous Q12H   Continuous Infusions:  sodium chloride Stopped (12/23/21 0547)   ceFEPime (MAXIPIME) IV 2 g (12/23/21 0951)   metronidazole 500 mg (12/23/21 0933)   vancomycin     PRN Meds:.sodium chloride, acetaminophen, docusate, HYDROmorphone (  DILAUDID) injection, LORazepam, metoprolol tartrate, oxyCODONE, polyethylene glycol  Antimicrobials: Anti-infectives (From admission, onward)    Start     Dose/Rate Route Frequency Ordered Stop   12/23/21 1100  vancomycin (VANCOREADY) IVPB 1250 mg/250 mL        1,250 mg 166.7 mL/hr over 90 Minutes Intravenous Every 12 hours 12/23/21 0922     12/23/21 1000  ceFEPIme (MAXIPIME) 2 g in sodium chloride 0.9 % 100 mL IVPB        2 g 200 mL/hr over 30 Minutes Intravenous Every 8 hours  12/23/21 0922     12/22/21 2048  metroNIDAZOLE (FLAGYL) IVPB 500 mg        500 mg 100 mL/hr over 60 Minutes Intravenous Every 12 hours 12/22/21 2049     12/21/21 1100  cefTRIAXone (ROCEPHIN) 2 g in sodium chloride 0.9 % 100 mL IVPB  Status:  Discontinued        2 g 200 mL/hr over 30 Minutes Intravenous Every 24 hours 12/21/21 0900 12/23/21 0904   12/20/21 1830  metroNIDAZOLE (FLAGYL) IVPB 500 mg  Status:  Discontinued        500 mg 100 mL/hr over 60 Minutes Intravenous Every 12 hours 12/20/21 1727 12/22/21 2049   12/20/21 1100  cefTRIAXone (ROCEPHIN) 1 g in sodium chloride 0.9 % 100 mL IVPB  Status:  Discontinued        1 g 200 mL/hr over 30 Minutes Intravenous Every 24 hours 12/20/21 1006 12/21/21 0900   12/19/21 2000  vancomycin (VANCOREADY) IVPB 1250 mg/250 mL  Status:  Discontinued        1,250 mg 166.7 mL/hr over 90 Minutes Intravenous Every 24 hours 12/18/21 1946 12/19/21 1207   12/19/21 1100  ceFEPIme (MAXIPIME) 2 g in sodium chloride 0.9 % 100 mL IVPB  Status:  Discontinued        2 g 200 mL/hr over 30 Minutes Intravenous Every 12 hours 12/19/21 1038 12/20/21 1006   12/19/21 0600  ceFEPIme (MAXIPIME) 2 g in sodium chloride 0.9 % 100 mL IVPB  Status:  Discontinued        2 g 200 mL/hr over 30 Minutes Intravenous Every 12 hours 12/18/21 1943 12/19/21 1038   12/18/21 1845  vancomycin (VANCOREADY) IVPB 2000 mg/400 mL        2,000 mg 200 mL/hr over 120 Minutes Intravenous STAT 12/18/21 1828 12/18/21 2151   12/18/21 1830  ceFEPIme (MAXIPIME) 2 g in sodium chloride 0.9 % 100 mL IVPB        2 g 200 mL/hr over 30 Minutes Intravenous STAT 12/18/21 1818 12/18/21 1951        I have personally reviewed the following labs and images: CBC: Recent Labs  Lab 12/18/21 1124 12/18/21 2207 12/20/21 0223 12/20/21 0801 12/21/21 0244 12/21/21 1253 12/22/21 0825 12/22/21 1933 12/22/21 2345  WBC 8.1   < > 8.6  --  5.7 5.9 4.1  --  4.6  NEUTROABS 6.5  --   --   --   --   --   --   --   --    HGB 4.0*   < > 6.7*   < > 7.1* 7.2* 6.0* 7.6* 7.1*  HCT 12.5*   < > 20.3*   < > 20.8* 22.3* 18.4* 22.9* 21.1*  MCV 105.0*   < > 94.9  --  94.1 97.4 97.4  --  95.5  PLT 78*   < > 31*  --  25* 21* 24*  --  25*   < > =  values in this interval not displayed.   BMP &GFR Recent Labs  Lab 12/19/21 0621 12/19/21 1030 12/20/21 0223 12/21/21 0244 12/22/21 0413 12/22/21 2345  NA  --  135 134* 134* 133* 134*  K  --  4.5 3.9 4.3 3.8 3.8  CL  --  102 103 104 103 104  CO2  --  21* 21* '24 24 25  '$ GLUCOSE  --  208* 203* 148* 164* 147*  BUN  --  56* 63* 42* 22 16  CREATININE  --  1.70* 1.89* 1.11 0.61 0.55*  CALCIUM  --  7.6* 7.8* 7.9* 7.6* 7.6*  MG 1.8  --  2.3 1.9 1.6* 1.6*  PHOS 4.7*  --   --  3.3 2.6 2.8   Estimated Creatinine Clearance: 101.3 mL/min (A) (by C-G formula based on SCr of 0.55 mg/dL (L)). Liver & Pancreas: Recent Labs  Lab 12/19/21 1030 12/20/21 0223 12/21/21 0244 12/22/21 0413 12/22/21 2345  AST 1,436* 678* 402* 426* 377*  ALT 627* 487* 330* 239* 205*  ALKPHOS 69 71 77 74 73  BILITOT 5.6* 3.9* 2.2* 1.4* 1.4*  PROT 5.9* 5.7* 5.4* 4.9* 5.0*  ALBUMIN 2.7* 2.5* 2.3* 1.9* 2.0*   Recent Labs  Lab 12/18/21 1124  LIPASE 30   Recent Labs  Lab 12/18/21 1222 12/21/21 0244 12/22/21 0405 12/22/21 2345  AMMONIA 52* 30 20 36*   Diabetic: No results for input(s): HGBA1C in the last 72 hours.  Recent Labs  Lab 12/18/21 1544  GLUCAP 59*   Cardiac Enzymes: Recent Labs  Lab 12/20/21 0801 12/21/21 0244 12/22/21 0413 12/22/21 2345  CKTOTAL 4,231* 7,837* 10,683* 8,396*   No results for input(s): PROBNP in the last 8760 hours. Coagulation Profile: Recent Labs  Lab 12/18/21 1124 12/21/21 0244 12/22/21 0413 12/22/21 2345  INR 1.8* 1.4* 1.4* 1.3*   Thyroid Function Tests: No results for input(s): TSH, T4TOTAL, FREET4, T3FREE, THYROIDAB in the last 72 hours. Lipid Profile: No results for input(s): CHOL, HDL, LDLCALC, TRIG, CHOLHDL, LDLDIRECT in the last 72  hours. Anemia Panel: No results for input(s): VITAMINB12, FOLATE, FERRITIN, TIBC, IRON, RETICCTPCT in the last 72 hours.  Urine analysis:    Component Value Date/Time   COLORURINE AMBER (A) 12/18/2021 1139   APPEARANCEUR CLEAR 12/18/2021 1139   LABSPEC 1.013 12/18/2021 1139   PHURINE 5.0 12/18/2021 1139   GLUCOSEU NEGATIVE 12/18/2021 1139   GLUCOSEU NEGATIVE 01/06/2015 0912   HGBUR LARGE (A) 12/18/2021 1139   BILIRUBINUR NEGATIVE 12/18/2021 1139   KETONESUR 5 (A) 12/18/2021 1139   PROTEINUR 100 (A) 12/18/2021 1139   UROBILINOGEN 1.0 01/06/2015 0912   NITRITE NEGATIVE 12/18/2021 1139   LEUKOCYTESUR NEGATIVE 12/18/2021 1139   Sepsis Labs: Invalid input(s): PROCALCITONIN, [place]  Microbiology: Recent Results (from the past 240 hour(s))  Blood Culture (routine x 2)     Status: None   Collection Time: 12/18/21 11:24 AM   Specimen: BLOOD  Result Value Ref Range Status   Specimen Description   Final    BLOOD BLOOD RIGHT HAND Performed at [facility] [address] [place], [place] [address]    Special Requests   Final    BOTTLES DRAWN AEROBIC AND ANAEROBIC Blood Culture adequate volume Performed at [facility] [address], [place], [place] [address]    Culture   Final    NO GROWTH 5 DAYS Performed at [facility], [place] [address], [place], [place] [address]    Report Status 12/23/2021 FINAL  Final  Blood Culture (routine x 2)     Status: None   Collection Time: 12/18/21 11:32 AM   Specimen: BLOOD  Result Value Ref Range Status   Specimen Description   Final    BLOOD RIGHT ANTECUBITAL Performed at [facility] [address] [place], [place] [address]    Special Requests   Final    BOTTLES DRAWN AEROBIC AND ANAEROBIC Blood Culture results may not be optimal due to an excessive volume of blood received in culture bottles Performed at [facility] [address]  [place], [place] [address]    Culture   Final    NO GROWTH 5 DAYS Performed at [facility], [place] [address] [place], [place] [address]    Report Status 12/23/2021 FINAL  Final  Urine Culture     Status: None   Collection Time: 12/18/21 11:39 AM   Specimen: In/Out Cath Urine  Result Value Ref Range Status   Specimen Description   Final    IN/OUT CATH URINE Performed at [facility] [address] [place], [place] [address]    Special Requests   Final    NONE Performed at [facility], [place] [address], [place], [place] [address]    Culture   Final    NO GROWTH Performed at [facility], [place] [address], [place], [place] [address]    Report Status 12/20/2021 FINAL  Final  Resp Panel by RT-PCR (Flu A&B, Covid) Nasopharyngeal Swab     Status: None   Collection Time: 12/18/21 12:22 PM   Specimen: Nasopharyngeal Swab; Nasopharyngeal(NP) swabs in vial transport medium  Result Value Ref Range Status   SARS Coronavirus 2 by RT PCR NEGATIVE NEGATIVE Final    Comment: (NOTE) SARS-CoV-2 target nucleic acids are NOT DETECTED.  The SARS-CoV-2 RNA is generally detectable in upper respiratory specimens during the acute phase of infection. The lowest concentration of SARS-CoV-2 viral copies this assay can detect is 138 copies/mL. A negative result does not preclude SARS-Cov-2 infection and should not be used as the sole basis for treatment or other patient management decisions. A negative result may occur with  improper specimen collection/handling, submission of specimen other than nasopharyngeal swab, presence of viral mutation(s) within the areas targeted by this assay, and inadequate number of viral copies(<138 copies/mL). A negative result must be combined with clinical observations, patient history, and epidemiological information. The expected result is Negative.  Fact Sheet for Patients:   EntrepreneurPulse.com.au  Fact Sheet for Healthcare Providers:  IncredibleEmployment.be  This test is no t yet approved or cleared by the [place] FDA and  has been authorized for detection and/or diagnosis of SARS-CoV-2 by FDA under an Emergency Use Authorization (EUA). This EUA will remain  in effect (meaning this test can be used) for the duration of the COVID-19 declaration under Section 564(b)(1) of the Act, 21 U.S.C.section 360bbb-3(b)(1), unless the authorization is terminated  or revoked sooner.       Influenza A by PCR NEGATIVE NEGATIVE Final   Influenza B by PCR NEGATIVE NEGATIVE Final    Comment: (NOTE) The Xpert Xpress SARS-CoV-2/FLU/RSV plus assay is intended as an aid in the diagnosis of influenza from Nasopharyngeal swab specimens and should not be used as a sole basis for treatment. Nasal washings and aspirates are unacceptable for Xpert Xpress SARS-CoV-2/FLU/RSV testing.  Fact Sheet for Patients: EntrepreneurPulse.com.au  Fact Sheet for Healthcare Providers: IncredibleEmployment.be  This test is not yet approved  or cleared by the [place] and has been authorized for detection and/or diagnosis of SARS-CoV-2 by FDA under an Emergency Use Authorization (EUA). This EUA will remain in effect (meaning this test can be used) for the duration of the COVID-19 declaration under Section 564(b)(1) of the Act, 21 U.S.C. section 360bbb-3(b)(1), unless the authorization is terminated or revoked.  Performed at [facility], [place] [address] [place], [place] [address]   MRSA Next Gen by PCR, Nasal     Status: Abnormal   Collection Time: 12/20/21  9:40 AM   Specimen: Nasal Mucosa; Nasal Swab  Result Value Ref Range Status   MRSA by PCR Next Gen DETECTED (A) NOT DETECTED Final    Comment: (NOTE) The GeneXpert MRSA Assay (FDA approved for NASAL specimens only), is one  component of a comprehensive MRSA colonization surveillance program. It is not intended to diagnose MRSA infection nor to guide or monitor treatment for MRSA infections. Test performance is not FDA approved in patients less than 53 years old. Performed at [facility], [place] [address], [place], [place] [address]     Radiology Studies: CT ABDOMEN PELVIS W CONTRAST  Result Date: 12/22/2021 CLINICAL DATA:  Septic shock.  Liver lesion. EXAM: CT ABDOMEN AND PELVIS WITH CONTRAST TECHNIQUE: Multidetector CT imaging of the abdomen and pelvis was performed using the standard protocol following bolus administration of intravenous contrast. RADIATION DOSE REDUCTION: This exam was performed according to the departmental dose-optimization program which includes automated exposure control, adjustment of the mA and/or kV according to patient size and/or use of iterative reconstruction technique. CONTRAST:  168m OMNIPAQUE IOHEXOL 300 MG/ML  SOLN COMPARISON:  CT AP from 12/20/2021. FINDINGS: Lower chest: Mild dependent changes identified within the lung bases. Hepatobiliary: Suspected cirrhosis. Hypertrophy of the lateral segment of left hepatic lobe and caudate lobe of liver noted. No focal liver lesion is identified. However, multiphase CT or MRI is the study of choice for evaluating hepatocellular carcinoma. Normal portal venous phase CT does not exclude the possibility of underlying HCC. Multiple gallstones are again noted within the gallbladder. Gas is now seen within the lumen of the gallbladder. There is gallbladder wall thickening with surrounding inflammatory fat stranding. Secondary inflammation with wall thickening of the hepatic flexure is noted. There is enteric contrast material within the inflamed segment of the colon. No signs of patent contrast opacified fistula with the gallbladder. No signs of bile duct dilatation. Pancreas: Unremarkable. No pancreatic ductal dilatation or  surrounding inflammatory changes. Spleen: There is a wedge-shaped area of hypoenhancement involving the anterior spleen compatible with splenic infarct, image 33/2. Adrenals/Urinary Tract: Normal adrenal glands. No kidney mass or hydronephrosis identified bilaterally. Urinary bladder is unremarkable. Stomach/Bowel: Stomach appears normal. Increase caliber of the duodenum within the left hemiabdomen is noted favoring focal ileus. Focal wall thickening involving the hepatic flexure is identified where it abuts the inflamed gallbladder. Vascular/Lymphatic: Aortic atherosclerosis without aneurysm. No abdominopelvic adenopathy. Reproductive: Prostate is unremarkable. Other: Small volume of free fluid noted within the pelvis. No discrete fluid collection to suggest abscess. Musculoskeletal: Limited imaging through the lower extremity show marked asymmetric swelling of the medial musculature of the left thigh with gas tracking throughout the muscle and surrounding overlying fat stranding. Previous ORIF of the right femur. Unchanged superior endplate compression fracture involving the L2 vertebra. IMPRESSION: 1. Imaging findings compatible with acute cholecystitis. Gas is now identified within the lumen of the gallbladder. Diagnosis of exclusion would be acute emphysematous cholecystitis. Alternatively, presence  of gas within the gallbladder lumen may reflect cholecystocolonic fistula although there is no patent contrast opacifying fistula identified on the current exam (with sufficient contrast material noted within the adjacent inflamed loop of colon). 2. Acute splenic infarct. 3. Marked asymmetric swelling of the medial musculature of the left thigh with gas tracking throughout the muscle with surrounding fat stranding. Cannot exclude necrotizing fasciitis. 4. Increase caliber of the duodenum within the left hemiabdomen favoring focal ileus. 5. Cirrhosis. No mass identified within the liver to suggest underlying  malignancy. Certainly there is no lesion within the liver which should impact the patient's current acute clinical situation. Ideally, in a patient that is at increased risk for hepatocellular carcinoma the study of choice for screening [place] would be an outpatient, multiphase MRI. This should be performed only when the patient is able to remain motionless and participate in breath hold technique. Alternatively, outpatient multiphase liver CT would be recommended. 6. Aortic Atherosclerosis (ICD10-I70.0). Critical Value/emergent results were called by telephone at the time of interpretation on 12/22/2021 at 7:36 pm to provider Rufina Falco, NP , who verbally acknowledged these results. Electronically Signed   By: Kerby Moors M.D.   On: 12/22/2021 19:37   DG CHEST PORT 1 VIEW  Result Date: 12/23/2021 CLINICAL DATA:  Aspiration precautions. EXAM: PORTABLE CHEST 1 VIEW COMPARISON:  Chest radiograph Dec 18, 2021. FINDINGS: Low lung volumes. No consolidation. No visible pleural effusions or pneumothorax. Similar cardiomediastinal silhouette. IMPRESSION: Low lung volumes without evidence of acute cardiopulmonary disease. Electronically Signed   By: Margaretha Sheffield M.D.   On: 12/23/2021 09:56      Keyion Knack T. [facility]  If 7PM-7AM, please contact night-coverage [email or web address] 12/23/2021, 11:15 AM

## 2021-12-23 NOTE — Procedures (Signed)
Interventional Radiology Procedure Note  Procedure: 10.2 fr Cholecystostomy drain  Indication: Acute cholecystitis  Findings: Please refer to procedural dictation for full description.  Complications: None  EBL: < 10 mL  Miachel Roux, MD [phone]

## 2021-12-23 NOTE — Progress Notes (Signed)
Platelets on hold for IR 5/27 am. Confirmed with NP.

## 2021-12-23 NOTE — Progress Notes (Signed)
Subjective/Chief Complaint: The patient is seen in the ICU with both sons at the bedside Mildly confused, but conversant and interactive At rest, describes no pain in his abdomen.  A little bit of "soreness" in his left thigh.  He has been asking for PRN pain meds for pain in both lower extremities. IR has been consulted for percutaneous cholecystostomy today.   Transfusions pending - platelets and PRBC.   Objective: Vital signs in last 24 hours: Temp:  [97.7 F (36.5 C)-100 F (37.8 C)] 99 F (37.2 C) (05/27 0805) Pulse Rate:  [79-159] 155 (05/27 0805) Resp:  [13-26] 23 (05/27 0805) BP: (107-149)/(36-95) 143/67 (05/27 0805) SpO2:  [93 %-100 %] 98 % (05/27 0805) Weight:  [98.2 kg-100.3 kg] 98.2 kg (05/27 0412) Last BM Date : 12/22/21  Intake/Output from previous day: 05/26 0701 - 05/27 0700 In: 2170.7 [P.O.:720; I.V.:43.2; Blood:1070; IV Piggyback:337.5] Out: 2350 [Urine:2350] Intake/Output this shift: No intake/output data recorded.  Obese male - NAD Awake, alert, oriented x 3 Abd - soft, minimal upper abdominal tenderness; negative Murphy's sign Skin - mild jaundice Right AKA - thigh soft Left thigh - no skin discoloration; edematous; moderately tender to palpation over medial thigh  Lab Results:  Recent Labs    12/22/21 0825 12/22/21 1933 12/22/21 2345  WBC 4.1  --  4.6  HGB 6.0* 7.6* 7.1*  HCT 18.4* 22.9* 21.1*  PLT 24*  --  25*   BMET Recent Labs    12/22/21 0413 12/22/21 2345  NA 133* 134*  K 3.8 3.8  CL 103 104  CO2 24 25  GLUCOSE 164* 147*  BUN 22 16  CREATININE 0.61 0.55*  CALCIUM 7.6* 7.6*   PT/INR Recent Labs    12/22/21 0413 12/22/21 2345  LABPROT 16.6* 15.8*  INR 1.4* 1.3*      Latest Ref Rng & Units 12/22/2021   11:45 PM 12/22/2021    4:13 AM 12/21/2021    2:44 AM  Hepatic Function  Total Protein 6.5 - 8.1 g/dL 5.0   4.9   5.4    Albumin 3.5 - 5.0 g/dL 2.0   1.9   2.3    AST 15 - 41 U/L 377   426   402    ALT 0 - 44 U/L 205    239   330    Alk Phosphatase 38 - 126 U/L 73   74   77    Total Bilirubin 0.3 - 1.2 mg/dL 1.4   1.4   2.2       Studies/Results: CT ABDOMEN PELVIS W CONTRAST  Result Date: 12/22/2021 CLINICAL DATA:  Septic shock.  Liver lesion. EXAM: CT ABDOMEN AND PELVIS WITH CONTRAST TECHNIQUE: Multidetector CT imaging of the abdomen and pelvis was performed using the standard protocol following bolus administration of intravenous contrast. RADIATION DOSE REDUCTION: This exam was performed according to the departmental dose-optimization program which includes automated exposure control, adjustment of the mA and/or kV according to patient size and/or use of iterative reconstruction technique. CONTRAST:  126m OMNIPAQUE IOHEXOL 300 MG/ML  SOLN COMPARISON:  CT AP from 12/20/2021. FINDINGS: Lower chest: Mild dependent changes identified within the lung bases. Hepatobiliary: Suspected cirrhosis. Hypertrophy of the lateral segment of left hepatic lobe and caudate lobe of liver noted. No focal liver lesion is identified. However, multiphase CT or MRI is the study of choice for evaluating hepatocellular carcinoma. Normal portal venous phase CT does not exclude the possibility of underlying HCC. Multiple gallstones are again noted within  the gallbladder. Gas is now seen within the lumen of the gallbladder. There is gallbladder wall thickening with surrounding inflammatory fat stranding. Secondary inflammation with wall thickening of the hepatic flexure is noted. There is enteric contrast material within the inflamed segment of the colon. No signs of patent contrast opacified fistula with the gallbladder. No signs of bile duct dilatation. Pancreas: Unremarkable. No pancreatic ductal dilatation or surrounding inflammatory changes. Spleen: There is a wedge-shaped area of hypoenhancement involving the anterior spleen compatible with splenic infarct, image 33/2. Adrenals/Urinary Tract: Normal adrenal glands. No kidney mass or  hydronephrosis identified bilaterally. Urinary bladder is unremarkable. Stomach/Bowel: Stomach appears normal. Increase caliber of the duodenum within the left hemiabdomen is noted favoring focal ileus. Focal wall thickening involving the hepatic flexure is identified where it abuts the inflamed gallbladder. Vascular/Lymphatic: Aortic atherosclerosis without aneurysm. No abdominopelvic adenopathy. Reproductive: Prostate is unremarkable. Other: Small volume of free fluid noted within the pelvis. No discrete fluid collection to suggest abscess. Musculoskeletal: Limited imaging through the lower extremity show marked asymmetric swelling of the medial musculature of the left thigh with gas tracking throughout the muscle and surrounding overlying fat stranding. Previous ORIF of the right femur. Unchanged superior endplate compression fracture involving the L2 vertebra. IMPRESSION: 1. Imaging findings compatible with acute cholecystitis. Gas is now identified within the lumen of the gallbladder. Diagnosis of exclusion would be acute emphysematous cholecystitis. Alternatively, presence of gas within the gallbladder lumen may reflect cholecystocolonic fistula although there is no patent contrast opacifying fistula identified on the current exam (with sufficient contrast material noted within the adjacent inflamed loop of colon). 2. Acute splenic infarct. 3. Marked asymmetric swelling of the medial musculature of the left thigh with gas tracking throughout the muscle with surrounding fat stranding. Cannot exclude necrotizing fasciitis. 4. Increase caliber of the duodenum within the left hemiabdomen favoring focal ileus. 5. Cirrhosis. No mass identified within the liver to suggest underlying malignancy. Certainly there is no lesion within the liver which should impact the patient's current acute clinical situation. Ideally, in a patient that is at increased risk for hepatocellular carcinoma the study of choice for screening  [place] would be an outpatient, multiphase MRI. This should be performed only when the patient is able to remain motionless and participate in breath hold technique. Alternatively, outpatient multiphase liver CT would be recommended. 6. Aortic Atherosclerosis (ICD10-I70.0). Critical Value/emergent results were called by telephone at the time of interpretation on 12/22/2021 at 7:36 pm to provider Rufina Falco, NP , who verbally acknowledged these results. Electronically Signed   By: Kerby Moors M.D.   On: 12/22/2021 19:37   VAS [place] ABI WITH/WO TBI  Result Date: 12/21/2021  LOWER EXTREMITY DOPPLER STUDY Patient Name:  [name].  Date of Exam:   12/21/2021 Medical Rec #: [id number]             Accession #:    [id number] Date of Birth: [birth year]             Patient Gender: M Patient Age:   71 years Exam Location:  [facility] Procedure:      VAS [place] ABI WITH/WO TBI Referring Phys: TAYE GONFA --------------------------------------------------------------------------------  Indications: Peripheral artery disease, and weakness/numbness. High Risk Factors: Hypertension, hyperlipidemia, no history of smoking.  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 Comparison Study: No previous exams Performing Technologist: Hill, Jody RVT, RDMS  Examination Guidelines: A complete evaluation includes at minimum, Doppler waveform signals and systolic blood pressure reading at the  level of bilateral brachial, anterior tibial, and posterior tibial arteries, when vessel segments are accessible. Bilateral testing is considered an integral part of a complete examination. Photoelectric Plethysmograph (PPG) waveforms and toe systolic pressure readings are included as required and additional duplex testing as needed. Limited examinations for reoccurring indications may be performed as noted.  ABI Findings: +--------+------------------+-----+---------+--------+ Right   Rt Pressure (mmHg)IndexWaveform  Comment  +--------+------------------+-----+---------+--------+ [id number]                    triphasic         +--------+------------------+-----+---------+--------+ +---------+------------------+-----+-------------------+-------+ Left     Lt Pressure (mmHg)IndexWaveform           Comment +---------+------------------+-----+-------------------+-------+ Brachial 138                    triphasic                  +---------+------------------+-----+-------------------+-------+ PTA      43                0.29 dampened monophasic        +---------+------------------+-----+-------------------+-------+ DP       47                0.32 dampened monophasic        +---------+------------------+-----+-------------------+-------+ Great Toe                       Absent                     +---------+------------------+-----+-------------------+-------+ +-------+-----------+-----------+------------+------------+ ABI/TBIToday's ABIToday's TBIPrevious ABIPrevious TBI +-------+-----------+-----------+------------+------------+ Left   0.32       absent                              +-------+-----------+-----------+------------+------------+  RT AKA.  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 The left toe-brachial index is absent. *See table(s) above for measurements and observations.  Vascular consult recommended. Electronically signed by Jamelle Haring on 12/21/2021 at 4:50:57 PM.    Final     Anti-infectives: Anti-infectives (From admission, onward)    Start     Dose/Rate Route Frequency Ordered Stop   12/22/21 2048  metroNIDAZOLE (FLAGYL) IVPB 500 mg        500 mg 100 mL/hr over 60 Minutes Intravenous Every 12 hours 12/22/21 2049     12/21/21 1100  cefTRIAXone (ROCEPHIN) 2 g in sodium chloride 0.9 % 100 mL IVPB        2 g 200 mL/hr over 30 Minutes Intravenous Every 24 hours 12/21/21 0900     12/20/21 1830   metroNIDAZOLE (FLAGYL) IVPB 500 mg  Status:  Discontinued        500 mg 100 mL/hr over 60 Minutes Intravenous Every 12 hours 12/20/21 1727 12/22/21 2049   12/20/21 1100  cefTRIAXone (ROCEPHIN) 1 g in sodium chloride 0.9 % 100 mL IVPB  Status:  Discontinued        1 g 200 mL/hr over 30 Minutes Intravenous Every 24 hours 12/20/21 1006 12/21/21 0900   12/19/21 2000  vancomycin (VANCOREADY) IVPB 1250 mg/250 mL  Status:  Discontinued        1,250 mg 166.7 mL/hr over 90 Minutes Intravenous Every 24 hours 12/18/21 1946 12/19/21 1207   12/19/21 1100  ceFEPIme (MAXIPIME) 2 g in sodium chloride 0.9 % 100 mL IVPB  Status:  Discontinued  2 g 200 mL/hr over 30 Minutes Intravenous Every 12 hours 12/19/21 1038 12/20/21 1006   12/19/21 0600  ceFEPIme (MAXIPIME) 2 g in sodium chloride 0.9 % 100 mL IVPB  Status:  Discontinued        2 g 200 mL/hr over 30 Minutes Intravenous Every 12 hours 12/18/21 1943 12/19/21 1038   12/18/21 1845  vancomycin (VANCOREADY) IVPB 2000 mg/400 mL        2,000 mg 200 mL/hr over 120 Minutes Intravenous STAT 12/18/21 1828 12/18/21 2151   12/18/21 1830  ceFEPIme (MAXIPIME) 2 g in sodium chloride 0.9 % 100 mL IVPB        2 g 200 mL/hr over 30 Minutes Intravenous STAT 12/18/21 1818 12/18/21 1951       Assessment/Plan: Hepatic cirrhosis secondary to hepatitis C Found down - rhabdomyolysis  CT scan 5/26 with findings consistent with acute calculus cholecystitis, 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    Would proceed with percutaneous cholecystostomy tube placement to address his acute cholecystitis.  His left thigh is swollen and tender.  Since the edema and tracking air seems to be within the muscular  compartments, I have called Dr. Kathaleen Bury of [facility] to consult on the patient.  Transfuse PRBC and platelets so the patient can have any interventions that are needed.  Keep him NPO until after interventions are complete for today.    LOS: 5 days    Maia Petties 12/23/2021

## 2021-12-23 NOTE — Consult Note (Signed)
Chief Complaint: Patient was seen in consultation today for acute cholecystitis   Referring Physician(s): Dr. Georgette Dover  Supervising Physician: Mir, Sharen Heck  Patient Status: [facility] - In-pt  History of Present Illness: Derek Curley Karge [name]. is a 71 y.o. male with past medical history of right AKA, cirrhosis, non-immunice hemolytic anemia, anxiety, hep C, HLD who presented to [facility] ED 12/18/21 with encephalopathy, severe anemia with HgB 4.0, and shock.  He was admitted to ICU on pressors and treated for shock.   CT Abdomen Pelvis: 1. Imaging findings compatible with acute cholecystitis. Gas is now identified within the lumen of the gallbladder. Diagnosis of exclusion would be acute emphysematous cholecystitis. Alternatively, presence of gas within the gallbladder lumen may reflect cholecystocolonic fistula although there is no patent contrast opacifying fistula identified on the current exam (with sufficient contrast material noted within the adjacent inflamed loop of colon). 2. Acute splenic infarct. 3. Marked asymmetric swelling of the medial musculature of the left thigh with gas tracking throughout the muscle with surrounding fat stranding. Cannot exclude necrotizing fasciitis. 4. Increase caliber of the duodenum within the left hemiabdomen favoring focal ileus. 5. Cirrhosis. No mass identified within the liver to suggest underlying malignancy. Certainly there is no lesion within the liver which should impact the patient's current acute clinical situation. Ideally, in a patient that is at increased risk for hepatocellular carcinoma the study of choice for screening [place] would be an outpatient, multiphase MRI. This should be performed only when the patient is able to remain motionless and participate in breath hold technique. Alternatively, outpatient multiphase liver CT would be recommended.  IR consulted for percutaneous cholecystostomy tube placement.   Patient assessed at  bedside.  He is accompanied by his family who are all aware of the complexity of his current illness. Discussed percutaneous cholecystostomy tube placement. Patient and family agreeable.   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   Squamous cell carcinoma of skin 05/21/2018   R mid volar forearm   Thyroid disease    UROLITHIASIS, HX OF 12/23/2007    Past Surgical History:  Procedure Laterality Date   ABOVE KNEE LEG AMPUTATION Right    Right femur fracture, 11/1994, resulted in right AKA 2006 after staph infection   COLONOSCOPY N/A 12/16/2020   Procedure: COLONOSCOPY;  Surgeon: Virgel Manifold, MD;  Location: [facility] ENDOSCOPY;  Service: Endoscopy;  Laterality: N/A;   ESOPHAGOGASTRODUODENOSCOPY N/A 12/16/2020   Procedure: ESOPHAGOGASTRODUODENOSCOPY (EGD);  Surgeon: Virgel Manifold, MD;  Location: [facility] ENDOSCOPY;  Service: Endoscopy;  Laterality: N/A;    Allergies: Clindamycin  Medications: Prior to Admission medications   Medication Sig Start Date End Date Taking? Authorizing Provider  ALPRAZolam Duanne Moron) 1 MG tablet Take 1 mg by mouth 3 (three) times daily.   Yes [provider]  Cholecalciferol (VITAMIN D3) 10 MCG (400 UNIT) CAPS Take 4 capsules by mouth daily.   Yes [provider]  folic acid (FOLVITE) 1 MG tablet TAKE 1 TABLET(1 MG)  BY MOUTH DAILY Patient taking differently: Take 1 mg by mouth daily. 10/12/21  Yes Burns, Wandra Feinstein, NP  hydrochlorothiazide (HYDRODIURIL) 12.5 MG tablet TAKE 1 TABLET(12.5 MG) BY  MOUTH DAILY Patient taking differently: Take 12.5 mg by mouth daily. 10/27/21  Yes [name], FNP  levothyroxine (SYNTHROID) 50 MCG tablet TAKE 1 TABLET(50 MCG) BY MOUTH DAILY BEFORE BREAKFAST Patient taking differently: Take 50 mcg by mouth daily before breakfast. 12/14/21  Yes Arnett, [name], FNP  losartan (COZAAR) 100 MG tablet TAKE 1 TABLET(100 MG) BY MOUTH DAILY Patient taking differently: Take 100 mg by mouth daily. 11/14/21  Yes Arnett, [name], FNP  lubiprostone (AMITIZA) 24 MCG capsule Take 24 mcg by mouth daily as needed. 08/13/14  Yes [provider]  meloxicam (MOBIC) 15 MG tablet Take 15 mg by mouth every morning. 01/12/21  Yes [provider]  morphine (MS CONTIN) 15 MG 12 hr tablet Take 15 mg by mouth 2 (two) times daily as needed for pain. 04/06/20  Yes [provider]  Oxycodone HCl 20 MG TABS Take 20 mg by mouth 4 (four) times daily. 12/04/21  Yes [provider]  pantoprazole (PROTONIX) 40 MG tablet Take 1 tablet (40 mg total) by mouth daily. 12/18/20 12/18/21 Yes Sharen Hones, MD  vitamin B-12 (CYANOCOBALAMIN) 1000 MCG tablet Take 1 tablet (1,000 mcg total) by mouth daily. 04/09/18  Yes Bettey Costa, MD  NARCAN 4 MG/0.1ML LIQD nasal spray kit Place 1 spray into the nose once. 12/29/18   [provider]     Family History  Problem Relation Age of Onset   Heart disease Mother    Lung cancer Mother 41   Heart attack Paternal Grandmother    Heart attack Paternal Grandfather    Liver cancer Neg Hx     Social History   Socioeconomic History   Marital status: Widowed    Spouse name: Not on file   Number of children: Not on file   Years of education: Not on file   Highest education level: Not on file  Occupational History   Occupation: Disabled  Tobacco Use   Smoking status: Never   Smokeless tobacco: Never  Substance and Sexual Activity   Alcohol use: Not Currently    Alcohol/week: 0.0 standard drinks   Drug use: No    Sexual activity: Not on file  Other Topics Concern   Not on file  Social History Narrative   Widowed 1997   Lives alone   Lost a son in his arms in 68.    3 sons   Social Determinants of Health   Financial Resource Strain: Not on file  Food Insecurity: Not on file  Transportation Needs: Not on file  Physical Activity: Not on file  Stress: Not on file  Social Connections: Not on file     Review of Systems: A 12 point ROS discussed and pertinent positives are indicated in the HPI above.  All other systems are negative.  Review of Systems  Constitutional:  Negative for fatigue and fever.  Respiratory:  Positive for shortness of breath. Negative for cough.   Cardiovascular:  Negative for chest pain.  Gastrointestinal:  Positive for abdominal pain and blood in stool.  Musculoskeletal:  Negative for back pain.  Psychiatric/Behavioral:  Negative for behavioral problems and confusion.    Vital Signs: BP 133/70   Pulse 77   Temp 98.9 F (37.2 C)   Resp (!) 21   Ht 6' (1.829 m)  Wt 216 lb 7.9 oz (98.2 kg)   SpO2 96%   BMI 29.36 kg/m   Physical Exam Vitals and nursing note reviewed.  Constitutional:      General: He is not in acute distress.    Appearance: Normal appearance. He is ill-appearing.  HENT:     Mouth/Throat:     Mouth: Mucous membranes are moist.     Pharynx: Oropharynx is clear.  Cardiovascular:     Rate and Rhythm: Regular rhythm. Tachycardia present.  Pulmonary:     Effort: Pulmonary effort is normal.  Abdominal:     Palpations: Abdomen is soft.     Tenderness: There is abdominal tenderness.  Skin:    General: Skin is warm and dry.  Neurological:     General: No focal deficit present.     Mental Status: He is alert and oriented to person, place, and time. Mental status is at baseline.  Psychiatric:        Mood and Affect: Mood normal.        Behavior: Behavior normal.        Thought Content: Thought content normal.        Judgment: Judgment  normal.     MD Evaluation Airway: WNL Heart: WNL Abdomen: WNL Chest/ Lungs: WNL ASA  Classification: 3 Mallampati/Airway Score: Two   Imaging: CT ABDOMEN PELVIS WO CONTRAST  Result Date: 12/20/2021 CLINICAL DATA:  Acute abdominal pain EXAM: CT ABDOMEN AND PELVIS WITHOUT CONTRAST TECHNIQUE: Multidetector CT imaging of the abdomen and pelvis was performed following the standard protocol without IV contrast. RADIATION DOSE REDUCTION: This exam was performed according to the departmental dose-optimization program which includes automated exposure control, adjustment of the mA and/or kV according to patient size and/or use of iterative reconstruction technique. COMPARISON:  MRI abdomen dated 12/20/2021. Right upper quadrant ultrasound dated 12/18/2021. FINDINGS: Lower chest: Mild left basilar atelectasis. Hepatobiliary: Macronodular hepatic contour raises the possibility of cirrhosis. No focal hepatic lesion on unenhanced CT. Cholelithiasis with gallbladder distension and pericholecystic inflammatory changes, reflecting acute cholecystitis. Secondary inflammatory changes along the serosal aspect of the colon at the hepatic flexure (series 2/image 36). Localized perforation with fistulous communication is suggested on MR but is poorly evaluated on unenhanced CT (series 2/image 35). No intrahepatic or extrahepatic ductal dilatation. Pancreas: Within normal limits. Spleen: Mildly enlarged, measuring 16.4 cm in maximal craniocaudal dimension. Adrenals/Urinary Tract: Adrenal glands are within normal limits. Kidneys are within normal limits. No renal, ureteral, or bladder calculi. No hydronephrosis. Bladder is mildly thick-walled although underdistended. Stomach/Bowel: Stomach is within normal limits. No evidence of bowel obstruction. Normal appendix (series 2/image 61). Serosal wall thickening/inflammatory changes along the hepatic flexure, as described above. Mild left colonic diverticulosis, without evidence  of diverticulitis. Vascular/Lymphatic: No evidence of abdominal aortic aneurysm. Mild perigastric varices. Atherosclerotic calcifications of the abdominal aorta and branch vessels. No suspicious abdominopelvic lymphadenopathy. Reproductive: Prostate is unremarkable. Other: No abdominopelvic ascites. No free air. Musculoskeletal: Degenerative changes of the visualized thoracolumbar spine. Mild superior endplate changes at L2, likely chronic. IMPRESSION: Cholelithiasis with acute cholecystitis. Suspected localized perforation with secondary inflammatory changes involving the hepatic flexure of the colon, better evaluated on recent MRI. Surgical consultation is suggested. Secondary inflammatory changes along the serosal aspect of the colon at the hepatic flexure. Localized perforation with fistulous communication is suggested on MR but is poorly evaluated on unenhanced CT. Suspected cirrhosis. No focal hepatic lesion on unenhanced CT. Mild splenomegaly and perigastric varices. These results will be called to the ordering clinician  or representative by the Radiologist Assistant, and communication documented in the PACS or [employer]. Electronically Signed   By: Julian Hy M.D.   On: 12/20/2021 17:16   DG Abd 1 View  Result Date: 12/18/2021 CLINICAL DATA:  Enteric catheter placement EXAM: ABDOMEN - 1 VIEW COMPARISON:  None Available. FINDINGS: Frontal view of the lower chest and upper abdomen demonstrates a weighted tip of an enteric feeding catheter projecting over the gastric antrum. Bowel gas pattern is unremarkable. IMPRESSION: 1. Enteric catheter tip projecting over the gastric antrum. Electronically Signed   By: Randa Ngo M.D.   On: 12/18/2021 19:25   CT HEAD WO CONTRAST (5MM)  Result Date: 12/18/2021 CLINICAL DATA:  Altered mental status EXAM: CT HEAD WITHOUT CONTRAST TECHNIQUE: Contiguous axial images were obtained from the base of the skull through the vertex without intravenous  contrast. RADIATION DOSE REDUCTION: This exam was performed according to the departmental dose-optimization program which includes automated exposure control, adjustment of the mA and/or kV according to patient size and/or use of iterative reconstruction technique. COMPARISON:  12/21/2019 FINDINGS: Brain: No evidence of acute infarction, hemorrhage, cerebral edema, mass, mass effect, or midline shift. No hydrocephalus or extra-axial fluid collection. Vascular: No hyperdense vessel. Skull: Normal. Negative for fracture or focal lesion. Sinuses/Orbits: No acute finding. Other: The mastoid air cells are well aerated. IMPRESSION: No acute intracranial process. Electronically Signed   By: Merilyn Baba M.D.   On: 12/18/2021 21:43   CT ABDOMEN PELVIS W CONTRAST  Result Date: 12/22/2021 CLINICAL DATA:  Septic shock.  Liver lesion. EXAM: CT ABDOMEN AND PELVIS WITH CONTRAST TECHNIQUE: Multidetector CT imaging of the abdomen and pelvis was performed using the standard protocol following bolus administration of intravenous contrast. RADIATION DOSE REDUCTION: This exam was performed according to the departmental dose-optimization program which includes automated exposure control, adjustment of the mA and/or kV according to patient size and/or use of iterative reconstruction technique. CONTRAST:  156m OMNIPAQUE IOHEXOL 300 MG/ML  SOLN COMPARISON:  CT AP from 12/20/2021. FINDINGS: Lower chest: Mild dependent changes identified within the lung bases. Hepatobiliary: Suspected cirrhosis. Hypertrophy of the lateral segment of left hepatic lobe and caudate lobe of liver noted. No focal liver lesion is identified. However, multiphase CT or MRI is the study of choice for evaluating hepatocellular carcinoma. Normal portal venous phase CT does not exclude the possibility of underlying HCC. Multiple gallstones are again noted within the gallbladder. Gas is now seen within the lumen of the gallbladder. There is gallbladder wall  thickening with surrounding inflammatory fat stranding. Secondary inflammation with wall thickening of the hepatic flexure is noted. There is enteric contrast material within the inflamed segment of the colon. No signs of patent contrast opacified fistula with the gallbladder. No signs of bile duct dilatation. Pancreas: Unremarkable. No pancreatic ductal dilatation or surrounding inflammatory changes. Spleen: There is a wedge-shaped area of hypoenhancement involving the anterior spleen compatible with splenic infarct, image 33/2. Adrenals/Urinary Tract: Normal adrenal glands. No kidney mass or hydronephrosis identified bilaterally. Urinary bladder is unremarkable. Stomach/Bowel: Stomach appears normal. Increase caliber of the duodenum within the left hemiabdomen is noted favoring focal ileus. Focal wall thickening involving the hepatic flexure is identified where it abuts the inflamed gallbladder. Vascular/Lymphatic: Aortic atherosclerosis without aneurysm. No abdominopelvic adenopathy. Reproductive: Prostate is unremarkable. Other: Small volume of free fluid noted within the pelvis. No discrete fluid collection to suggest abscess. Musculoskeletal: Limited imaging through the lower extremity show marked asymmetric swelling of the medial musculature of the left  thigh with gas tracking throughout the muscle and surrounding overlying fat stranding. Previous ORIF of the right femur. Unchanged superior endplate compression fracture involving the L2 vertebra. IMPRESSION: 1. Imaging findings compatible with acute cholecystitis. Gas is now identified within the lumen of the gallbladder. Diagnosis of exclusion would be acute emphysematous cholecystitis. Alternatively, presence of gas within the gallbladder lumen may reflect cholecystocolonic fistula although there is no patent contrast opacifying fistula identified on the current exam (with sufficient contrast material noted within the adjacent inflamed loop of colon). 2.  Acute splenic infarct. 3. Marked asymmetric swelling of the medial musculature of the left thigh with gas tracking throughout the muscle with surrounding fat stranding. Cannot exclude necrotizing fasciitis. 4. Increase caliber of the duodenum within the left hemiabdomen favoring focal ileus. 5. Cirrhosis. No mass identified within the liver to suggest underlying malignancy. Certainly there is no lesion within the liver which should impact the patient's current acute clinical situation. Ideally, in a patient that is at increased risk for hepatocellular carcinoma the study of choice for screening [place] would be an outpatient, multiphase MRI. This should be performed only when the patient is able to remain motionless and participate in breath hold technique. Alternatively, outpatient multiphase liver CT would be recommended. 6. Aortic Atherosclerosis (ICD10-I70.0). Critical Value/emergent results were called by telephone at the time of interpretation on 12/22/2021 at 7:36 pm to provider Rufina Falco, NP , who verbally acknowledged these results. Electronically Signed   By: Kerby Moors M.D.   On: 12/22/2021 19:37   MR LIVER WO CONRTAST  Result Date: 12/20/2021 CLINICAL DATA:  Evaluate for hepatocellular carcinoma. EXAM: MRI ABDOMEN WITHOUT CONTRAST TECHNIQUE: Multiplanar multisequence MR imaging was performed without the administration of intravenous contrast. COMPARISON:  Abdominal sonogram 12/18/2021 FINDINGS: Lower chest: Atelectasis versus airspace disease noted within the left base. Hepatobiliary: Diffuse low signal throughout the liver on the in-phase sequence which increases in signal intensity on the out of phase sequences suggest iron deposition within the liver. There is hypertrophy of the lateral segment of left hepatic lobe as well as the caudate lobe. Contour the liver has a macro nodular appearance compatible with cirrhosis. Evaluation for underlying mass lesion is significantly limited due to lack  of IV contrast material and respiratory motion artifact. Furthermore, patient was unable to breath hold and requested early termination of the exam. Within this limitation no large liver mass identified. Multiple stones are identified within the gallbladder. These all measure around 4 mm. The gallbladder wall appears diffusely edematous with surrounding inflammatory fat stranding. There is a focal fluid collection which extends beyond the normal margins of the anterior wall of the gallbladder abutting the hepatic flexure, image 25/4 and image 14/6. As mentioned above examination is significantly limited reflecting motion artifact and lack of IV contrast material. No signs of common bile duct or intrahepatic bile duct dilatation. Pancreas:  No acute abnormality. Spleen:  The spleen is enlarged measuring 16.9 cm cranial caudal. Adrenals/Urinary Tract: Normal appearance of the adrenal glands. No kidney mass or signs of hydronephrosis. Stomach/Bowel: No abnormal bowel dilatation identified. Vascular/Lymphatic: No pathologically enlarged lymph nodes identified. No abdominal aortic aneurysm demonstrated. Other: There is free fluid identified within the upper abdomen extending over the liver dome. Musculoskeletal: Asymmetric increased signal intensity is identified localizing to the visualized portions of the left gluteus musculature, image 46/4. IMPRESSION: 1. Examination is limited due to lack of IV contrast material and respiratory motion artifact. Within this limitation no large liver mass identified. However study is  not diagnostic for excluding 8 cc in a patient who is at increased risk. 2. Morphologic features of the liver compatible with cirrhosis with stigmata of portal venous hypertension including splenomegaly and ascites. 3. Gallstones with diffuse gallbladder wall thickening and surrounding inflammatory fat stranding. A small volume of free fluid is identified extending over the dome of liver. There is a focal  fluid collection which extends beyond the normal margins of the anterior wall of the gallbladder abutting the hepatic flexure. This may represent a small abscess or focal perforation of the gallbladder. Cholecystocolonic fistula is also a potential consideration. Further evaluation with CT of the abdomen pelvis is recommended. 4. Asymmetric increased signal intensity identified localizing to the visualized portions of the left gluteus musculature. This is only partially visualized and is of uncertain significance. Attention on follow-up CT is advised. 5. Critical Value/emergent results were called by telephone at the time of interpretation on 12/20/2021 at 11:45 am to provider [facility] , who verbally acknowledged these results. Electronically Signed   By: Kerby Moors M.D.   On: 12/20/2021 11:46   DG CHEST PORT 1 VIEW  Result Date: 12/18/2021 CLINICAL DATA:  Central line placement EXAM: PORTABLE CHEST 1 VIEW COMPARISON:  12/18/2021 FINDINGS: Right-sided central venous catheter tip over the SVC. No pneumothorax. No focal opacity, or pleural effusion. Stable borderline cardiomegaly. IMPRESSION: 1. Right IJ central venous catheter tip over the SVC. No pneumothorax 2. Clear lung fields Electronically Signed   By: Donavan Foil M.D.   On: 12/18/2021 16:56   DG Chest Port 1 View  Result Date: 12/18/2021 CLINICAL DATA:  Questionable sepsis. EXAM: PORTABLE CHEST 1 VIEW COMPARISON:  Dec 21, 2019 FINDINGS: Likely projectional prominence of the cardiac silhouette and vascular pedicle. Aortic atherosclerosis. No focal airspace consolidation. No visible pleural effusion or pneumothorax. No acute abnormality. IMPRESSION: No focal airspace consolidation or visible pleural effusion. Electronically Signed   By: Dahlia Bailiff M.D.   On: 12/18/2021 12:06   VAS [place] ABI WITH/WO TBI  Result Date: 12/21/2021  LOWER EXTREMITY DOPPLER STUDY Patient Name:  [name].  Date of Exam:   12/21/2021 Medical Rec #:  [id number]             Accession #:    [id number] Date of Birth: 02-03-1951             Patient Gender: M Patient Age:   7 years Exam Location:  [facility] Procedure:      VAS [place] ABI WITH/WO TBI Referring Phys: TAYE GONFA --------------------------------------------------------------------------------  Indications: Peripheral artery disease, and weakness/numbness. High Risk Factors: Hypertension, hyperlipidemia, no history of smoking.  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 Comparison Study: No previous exams Performing Technologist: Hill, Jody RVT, RDMS  Examination Guidelines: A complete evaluation includes at minimum, Doppler waveform signals and systolic blood pressure reading at the level of bilateral brachial, anterior tibial, and posterior tibial arteries, when vessel segments are accessible. Bilateral testing is considered an integral part of a complete examination. Photoelectric Plethysmograph (PPG) waveforms and toe systolic pressure readings are included as required and additional duplex testing as needed. Limited examinations for reoccurring indications may be performed as noted.  ABI Findings: +--------+------------------+-----+---------+--------+ Right   Rt Pressure (mmHg)IndexWaveform Comment  +--------+------------------+-----+---------+--------+ [id number]                    triphasic         +--------+------------------+-----+---------+--------+ +---------+------------------+-----+-------------------+-------+ Left     Lt Pressure (mmHg)IndexWaveform  Comment +---------+------------------+-----+-------------------+-------+ Brachial 138                    triphasic                  +---------+------------------+-----+-------------------+-------+ PTA      43                0.29 dampened monophasic        +---------+------------------+-----+-------------------+-------+ DP       47                0.32 dampened monophasic         +---------+------------------+-----+-------------------+-------+ Great Toe                       Absent                     +---------+------------------+-----+-------------------+-------+ +-------+-----------+-----------+------------+------------+ ABI/TBIToday's ABIToday's TBIPrevious ABIPrevious TBI +-------+-----------+-----------+------------+------------+ Left   0.32       absent                              +-------+-----------+-----------+------------+------------+  RT AKA.  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 The left toe-brachial index is absent. *See table(s) above for measurements and observations.  Vascular consult recommended. Electronically signed by Jamelle Haring on 12/21/2021 at 4:50:57 PM.    Final    ECHOCARDIOGRAM COMPLETE  Result Date: 12/19/2021    ECHOCARDIOGRAM REPORT   Patient Name:   Jacobey Gura Mich [name]. Date of Exam: 12/19/2021 Medical Rec #:  [id number]            Height:       72.0 in Accession #:    [id number]           Weight:       196.2 lb Date of Birth:  Nov 03, 1950            BSA:          2.113 m Patient Age:    52 years             BP:           137/76 mmHg Patient Gender: M                    HR:           89 bpm. Exam Location:  Inpatient Procedure: 2D Echo, Cardiac Doppler and Color Doppler Indications:    Elevated troponin  History:        Patient has prior history of Echocardiogram examinations, most                 recent 04/09/2018. Risk Factors:Hypertension.  Sonographer:    Jefferey Pica Referring Phys: [id number] Marcus  1. Left ventricular ejection fraction, by estimation, is 55 to 60%. The left ventricle has normal function. The left ventricle demonstrates regional wall motion abnormalities (basal septal hypokinesis). There is moderate concentric left ventricular hypertrophy. Left ventricular diastolic parameters are consistent with Grade I diastolic dysfunction  (impaired relaxation). Qualitatively LV appears slightly underfilled.  2. Right ventricular systolic function is normal. The right ventricular size is normal. Tricuspid regurgitation signal is inadequate for assessing PA pressure.  3. The mitral valve is normal in structure. No evidence of mitral valve regurgitation. No evidence of mitral stenosis.  4. The aortic valve was not well  visualized. There is mild calcification of the aortic valve. Aortic valve regurgitation is not visualized.  5. The inferior vena cava is normal in size with greater than 50% respiratory variability, suggesting right atrial pressure of 3 mmHg. Comparison(s): Prior images reviewed side by side. Wall motion abnormalities are new from prior: prior study was not performed off-axis. LV size was normal in prior study. FINDINGS  Left Ventricle: Left ventricular ejection fraction, by estimation, is 55 to 60%. The left ventricle has normal function. The left ventricle demonstrates regional wall motion abnormalities. The left ventricular internal cavity size was normal in size. There is moderate concentric left ventricular hypertrophy. Left ventricular diastolic parameters are consistent with Grade I diastolic dysfunction (impaired relaxation).  LV Wall Scoring: The basal anteroseptal segment, mid inferoseptal segment, and basal inferoseptal segment are hypokinetic. Right Ventricle: The right ventricular size is normal. No increase in right ventricular wall thickness. Right ventricular systolic function is normal. Tricuspid regurgitation signal is inadequate for assessing PA pressure. Left Atrium: Left atrial size was normal in size. Right Atrium: Right atrial size was normal in size. Pericardium: Trivial pericardial effusion is present. Presence of epicardial fat layer. Mitral Valve: The mitral valve is normal in structure. No evidence of mitral valve regurgitation. No evidence of mitral valve stenosis. Tricuspid Valve: The tricuspid valve is  normal in structure. Tricuspid valve regurgitation is not demonstrated. No evidence of tricuspid stenosis. Aortic Valve: The aortic valve was not well visualized. There is mild calcification of the aortic valve. Aortic valve regurgitation is not visualized. Aortic valve peak gradient measures 8.5 mmHg. Pulmonic Valve: The pulmonic valve was not well visualized. Pulmonic valve regurgitation is not visualized. Aorta: The aortic root and ascending aorta are structurally normal, with no evidence of dilitation. Venous: The inferior vena cava is normal in size with greater than 50% respiratory variability, suggesting right atrial pressure of 3 mmHg. IAS/Shunts: No atrial level shunt detected by color flow Doppler.  LEFT VENTRICLE PLAX 2D LVIDd:         5.10 cm LVIDs:         3.30 cm LV PW:         1.50 cm LV IVS:        1.50 cm LVOT diam:     2.10 cm LV SV:         51 LV SV Index:   24 LVOT Area:     3.46 cm  IVC IVC diam: 1.80 cm LEFT ATRIUM           Index        RIGHT ATRIUM           Index LA diam:      3.30 cm 1.56 cm/m   RA Area:     16.60 cm LA Vol (A4C): 57.0 ml 26.97 ml/m  RA Volume:   48.90 ml  23.14 ml/m  AORTIC VALVE                 PULMONIC VALVE AV Area (Vmax): 2.28 cm     PV Vmax:       1.06 m/s AV Vmax:        146.00 cm/s  PV Peak grad:  4.5 mmHg AV Peak Grad:   8.5 mmHg LVOT Vmax:      96.00 cm/s LVOT Vmean:     55.600 cm/s LVOT VTI:       0.146 m  AORTA Ao Root diam: 3.70 cm Ao Asc diam:  3.40 cm MITRAL VALVE  MV Area (PHT): 3.63 cm    SHUNTS MV Decel Time: 209 msec    Systemic VTI:  0.15 m MV E velocity: 44.60 cm/s  Systemic Diam: 2.10 cm MV A velocity: 56.60 cm/s MV E/A ratio:  0.79 Rudean Haskell MD Electronically signed by Rudean Haskell MD Signature Date/Time: 12/19/2021/8:46:06 AM    Final    US Abdomen Limited RUQ (LIVER/GB)  Result Date: 12/18/2021 CLINICAL DATA:  A 71 year old male presents for evaluation of elevated liver function tests. EXAM: ULTRASOUND ABDOMEN LIMITED  RIGHT UPPER QUADRANT COMPARISON:  Imaging from January 25, 2021. FINDINGS: Gallbladder: Gallbladder not well assessed due to limitations with patient positioning and sonographic window. Question of gallbladder wall thickening. The gallbladder is not imaged in its entirety Common bile duct: Diameter: 3.3 mm though not well assessed due to patient positioning. Liver: Heterogeneous hepatic echotexture with lobular hepatic contours. Question of liver mass seen potentially along the LEFT hepatic lobe though not well assessed. Not definitively associated with the liver on some images. Again with limited assessment as this is deeper in the abdomen, not along the superficial hepatic margin. This may measure up to 6.5 x 6.4 x 4.8 cm. Portal vein is patent on color Doppler imaging with normal direction of blood flow towards the liver. Other: Again markedly limited assessment due to difficulty in positioning the patient for the evaluation. IMPRESSION: Very limited evaluation. Gallbladder is not fully assessed with signs of mild thickening along visualized portions. Liver with suspected mass measuring greater than 6 cm. Suggest further evaluation with contrasted CT, if intravenous contrast cannot be administered noncontrast CT would likely be of benefit to exclude acute process and guide further workup/management. Electronically Signed   By: Zetta Bills M.D.   On: 12/18/2021 14:00    Labs:  CBC: Recent Labs    12/21/21 0244 12/21/21 1253 12/22/21 0825 12/22/21 1933 12/22/21 2345  WBC 5.7 5.9 4.1  --  4.6  HGB 7.1* 7.2* 6.0* 7.6* 7.1*  HCT 20.8* 22.3* 18.4* 22.9* 21.1*  PLT 25* 21* 24*  --  25*    COAGS: Recent Labs    12/18/21 1124 12/21/21 0244 12/22/21 0413 12/22/21 2345  INR 1.8* 1.4* 1.4* 1.3*  APTT 32  --   --   --     BMP: Recent Labs    12/20/21 0223 12/21/21 0244 12/22/21 0413 12/22/21 2345  NA 134* 134* 133* 134*  K 3.9 4.3 3.8 3.8  CL 103 104 103 104  CO2 21* _0 GLUCOSE  203* 148* 164* 147*  BUN 63* 42* 22 16  CALCIUM 7.8* 7.9* 7.6* 7.6*  CREATININE 1.89* 1.11 0.61 0.55*  GFRNONAA 37* >60 >60 >60    LIVER FUNCTION TESTS: Recent Labs    12/20/21 0223 12/21/21 0244 12/22/21 0413 12/22/21 2345  BILITOT 3.9* 2.2* 1.4* 1.4*  AST 678* 402* 426* 377*  ALT 487* 330* 239* 205*  ALKPHOS 71 77 74 73  PROT 5.7* 5.4* 4.9* 5.0*  ALBUMIN 2.5* 2.3* 1.9* 2.0*    TUMOR MARKERS: No results for input(s): AFPTM, CEA, CA199, CHROMGRNA in the last 8760 hours.  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 Surgery is following and has requested percutaneous cholecystitis tube placement.  Case reviewed by Dr. Dwaine Gale who approves patient for case.   [name] is assessed at bedside.  He is getting 1u blood currently with 1u platelets  pending for the procedure.   Family is at bedside. Procedure discussed in detail and patient is agreeable to proceed.   Risks and benefits discussed with the patient including, but not limited to bleeding, infection, gallbladder perforation, bile leak, sepsis or even death.  All of the patient's questions were answered, patient is agreeable to proceed. Consent signed and in chart.  Thank you for this interesting consult.  I greatly enjoyed meeting Derek Suhr Haq [name]. and look forward to participating in their care.  A copy of this report was sent to the requesting provider on this date.  Electronically Signed: Docia Barrier, PA 12/23/2021, 9:41 AM   I spent a total of 40 Minutes    in face to face in clinical consultation, greater than 50% of which was counseling/coordinating care for acute cholecystitis.

## 2021-12-23 NOTE — Assessment & Plan Note (Addendum)
-  CT abdomen and pelvis shows muscular swelling with tracking gas within the proximal muscle compartments likely myonecrosis from traumatic rhabdomyolysis versus neck fasciitis.   -CK improving and almost normalized . -Orthopedic surgery signed off but will have them re-evaluate and they feel he is not a surgical candidate regardless but agree with Vascular Evaluation -MRI leg has been ordered for further investigation but will discontinue this given his repeat CTA with bilateral runoff; See Above MRI discontinued as it won't change management  -Continue monitoring closely

## 2021-12-23 NOTE — Plan of Care (Signed)

## 2021-12-23 NOTE — Assessment & Plan Note (Addendum)
-  Wedge-shaped area of hypoenhancement involving the anterior spleen concerning for splenic infarction noted on CT abdomen and pelvis. Repeat CTA shows stable lesion  -Per General Surgery. -They are planning to refer him out to [facility] for further issues after D/C; could be contributing to his thrombocytopenia

## 2021-12-24 DIAGNOSIS — K7031 Alcoholic cirrhosis of liver with ascites: Secondary | ICD-10-CM | POA: Diagnosis not present

## 2021-12-24 DIAGNOSIS — G9341 Metabolic encephalopathy: Secondary | ICD-10-CM | POA: Diagnosis not present

## 2021-12-24 DIAGNOSIS — R571 Hypovolemic shock: Secondary | ICD-10-CM | POA: Diagnosis not present

## 2021-12-24 DIAGNOSIS — N179 Acute kidney failure, unspecified: Secondary | ICD-10-CM | POA: Diagnosis not present

## 2021-12-24 LAB — BPAM PLATELET PHERESIS
Blood Product Expiration Date: 202305272359
Blood Product Expiration Date: 202305282359
ISSUE DATE / TIME: 202305271005
ISSUE DATE / TIME: 202305280055
Unit Type and Rh: 5100
Unit Type and Rh: 600

## 2021-12-24 LAB — PREPARE PLATELET PHERESIS
Unit division: 0
Unit division: 0

## 2021-12-24 LAB — CBC
HCT: 22.9 % — ABNORMAL LOW (ref 39.0–52.0)
Hemoglobin: 7.6 g/dL — ABNORMAL LOW (ref 13.0–17.0)
MCH: 31.9 pg (ref 26.0–34.0)
MCHC: 33.2 g/dL (ref 30.0–36.0)
MCV: 96.2 fL (ref 80.0–100.0)
Platelets: 25 10*3/uL — CL (ref 150–400)
RBC: 2.38 MIL/uL — ABNORMAL LOW (ref 4.22–5.81)
RDW: 19.1 % — ABNORMAL HIGH (ref 11.5–15.5)
WBC: 3.9 10*3/uL — ABNORMAL LOW (ref 4.0–10.5)
nRBC: 0.5 % — ABNORMAL HIGH (ref 0.0–0.2)

## 2021-12-24 LAB — COMPREHENSIVE METABOLIC PANEL
ALT: 153 U/L — ABNORMAL HIGH (ref 0–44)
AST: 251 U/L — ABNORMAL HIGH (ref 15–41)
Albumin: 2 g/dL — ABNORMAL LOW (ref 3.5–5.0)
Alkaline Phosphatase: 63 U/L (ref 38–126)
Anion gap: 4 — ABNORMAL LOW (ref 5–15)
BUN: 15 mg/dL (ref 8–23)
CO2: 26 mmol/L (ref 22–32)
Calcium: 7.6 mg/dL — ABNORMAL LOW (ref 8.9–10.3)
Chloride: 103 mmol/L (ref 98–111)
Creatinine, Ser: 0.65 mg/dL (ref 0.61–1.24)
GFR, Estimated: >60 mL/min (ref >60)
Glucose, Bld: 149 mg/dL — ABNORMAL HIGH (ref 70–99)
Potassium: 3.5 mmol/L (ref 3.5–5.1)
Sodium: 133 mmol/L — ABNORMAL LOW (ref 135–145)
Total Bilirubin: 1.4 mg/dL — ABNORMAL HIGH (ref 0.3–1.2)
Total Protein: 5 g/dL — ABNORMAL LOW (ref 6.5–8.1)

## 2021-12-24 LAB — PREPARE FRESH FROZEN PLASMA: Unit division: 0

## 2021-12-24 LAB — AMMONIA: Ammonia: 21 umol/L (ref 9–35)

## 2021-12-24 LAB — MAGNESIUM: Magnesium: 1.5 mg/dL — ABNORMAL LOW (ref 1.7–2.4)

## 2021-12-24 LAB — BPAM FFP
Blood Product Expiration Date: 202306012359
ISSUE DATE / TIME: 202305270230
Unit Type and Rh: 7300

## 2021-12-24 LAB — PHOSPHORUS: Phosphorus: 3.5 mg/dL (ref 2.5–4.6)

## 2021-12-24 LAB — PROTIME-INR
INR: 1.3 — ABNORMAL HIGH (ref 0.8–1.2)
Prothrombin Time: 16.3 seconds — ABNORMAL HIGH (ref 11.4–15.2)

## 2021-12-24 LAB — CK: Total CK: 5583 U/L — ABNORMAL HIGH (ref 49–397)

## 2021-12-24 MED ORDER — POTASSIUM CHLORIDE CRYS ER 20 MEQ PO TBCR
40.0000 meq | EXTENDED_RELEASE_TABLET | Freq: Once | ORAL | Status: AC
  Administered 2021-12-24: 40 meq via ORAL
  Filled 2021-12-24: qty 2

## 2021-12-24 MED ORDER — MAGNESIUM SULFATE 4 GM/100ML IV SOLN
4.0000 g | Freq: Once | INTRAVENOUS | Status: AC
  Administered 2021-12-24: 4 g via INTRAVENOUS
  Filled 2021-12-24: qty 100

## 2021-12-24 NOTE — Progress Notes (Signed)
NAME:  [name], [name] MRN:  [id number], DOB:  January 19, 1951, LOS: 6 ADMISSION DATE:  12/18/2021, CONSULTATION DATE:  12/24/21 REFERRING MD:  EDP, CHIEF COMPLAINT:  weakness and confusion   History of Present Illness:  [name] is a 71 y.o. 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  History supplemented by pt's son via phone, states that pt's brother also found him confused and on the ground the day before.  He is supposed to be taking daily medication for cirrhosis, but unsure what it is.  Pt lives independently with health aide, but concerns that he recently has not been able to care for himself.   He has history of variceal changes on [place], denies blood in stool or dark stools  On arrival to the ED pt was hypotensive and altered, labs significant for Hgb of 4.0, lactic acid >9, WBC 8k, Ast 495, ALT 107 and bilirubin 8.   He was given IVF, transfusion ordered and started on Levophed peripherally.    Transferred to [facility] 5/24 after pressors weaned.  Received total 4U PRBC since admission. 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 Not a surgical candidate. Managed with IV antibiotics. 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   Significant Hospital Events: Including procedures, antibiotic start and stop dates in addition to other pertinent events   5/22 presented to [facility] altered and hypotensive, admit to [facility] 5/23 off pressors and more oriented this morning, received 1 more unit PRBC's overnight 5/24 Transferred to [place]. Received total 4U PRBC since admission. 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 Not a surgical candidate. Managed with IV antibiotics. Transfer to hospitalist service 5/27 PCCM reconsulted with worsening cholecystitis, GI bleed. IR placed perc biliary drain  Interim History / Subjective:   Had percutaneous biliary drain placed by interventional radiology.  Has remained hemodynamically stable  Objective   Blood pressure 135/65, pulse 90, temperature 99.2 F (37.3 C), temperature source Axillary, resp. rate 16, height 6' (1.829 m), weight 99.5 kg, SpO2 96 %.        Intake/Output Summary (Last 24 hours) at 12/24/2021 0922 Last data filed at 12/24/2021 6761 Gross per 24 hour  Intake 1998.52 ml  Output 1785 ml  Net 213.52 ml   Filed Weights   12/22/21 2035 12/23/21 0412 12/24/21 0500  Weight: 100.3 kg 98.2 kg 99.5 kg     Physical Exam: Gen:      No acute distress HEENT:  EOMI, sclera anicteric Neck:     No masses; no thyromegaly Lungs:    Clear to auscultation bilaterally; normal respiratory effort CV:         Regular rate and rhythm; no murmurs Abd:      + bowel sounds; soft, non-tender; no palpable masses, no distension Ext: Right AKA Skin:  Warm and dry; no rash Neuro: alert and oriented x 3 Psych: normal mood and affect   Labs reviewed Significant for sodium 133, BUN/creatinine 15/0.65 AST 251, ALT 153 Hemoglobin stable at 7.6 No new imaging  Resolved Hospital Problem list   Shock, suspect combination of hypovolemic from acute on chronic  hemolytic anemia and acutely worsening liver failure AKI Hyperkalemia AGMA  Assessment & Plan:  Acute GI bleed Acute on chronic blood loss anemia History of Non-immune hemolytic anemia Chronic thrombocytopenia and leukopenia Secondary to liver cirrhosis Underwent bone marrow bx 01/2021 with hypercellular bone marrow with erythroid proliferation, suspected secondary to liver disease, currently being monitored Transfuse PRBCs, continue PPI GI is on board.  Acute cholecystitis CT scan from yesterday reviewed with worsening cholecystitis, now with gas in the lumen of gallbladder. He is not an operative candidate for surgery due to multiple comorbidities and cirrhosis, low platelets.  S/p percutaneous biliary drain by IR Monitor labs Continue broad antibiotic coverage Follow blood cultures and lactic acid  Left thigh swelling CT noted with edema and tracking air. Orthopedics has been consulted.   Goals of care Discussed with patient and 2 sons, brother at bedside on 5/27.  They are aware of the seriousness of his illness.  They are ready to meet with palliative care who already has been consulted for pain management and goals of care discussions Family wants to maintain full code for now but would not want prolonged life support.  Best Practice (right click and "Reselect all SmartList Selections" daily)   Diet/type: NPO  DVT prophylaxis: SCD GI prophylaxis: PPI Lines: N/A Foley:  N/A Code Status:  full code Last date of multidisciplinary goals of care discussion '[ ]'$  as above  Critical care time:    The patient is critically ill with multiple organ system failure and requires high complexity decision making for assessment and support, frequent evaluation and titration of therapies, advanced monitoring, review of radiographic studies and interpretation of complex data.   Critical Care Time devoted to patient care services, exclusive of separately billable procedures, described in  this note is 35 minutes.   Marshell Garfinkel MD [facility] See Amion for pager  If no response to pager , please call [phone] until 7pm After 7:00 pm call Elink  [phone] 12/24/2021, 9:22 AM

## 2021-12-24 NOTE — Progress Notes (Addendum)
At approximately 2300, pt smeared feces all over self and bed; pulled off purewick, tele lines;  Pulled out IV x 2; pt confused to place, time and situation.  Pt education and redirection attempted unsuccessfully; mitts applied.  While redressing drain at approximately 0500, pt lightly bopped RN on top of head, saying "boop".  Pt again redirected and told that it was inappropriate to lay hands on others.  Pt then stated he was only kidding.

## 2021-12-24 NOTE — Progress Notes (Signed)
[facility] ADULT ICU REPLACEMENT PROTOCOL   The patient does apply for the [facility] Adult ICU Electrolyte Replacment Protocol based on the criteria listed below:   1.Exclusion criteria: TCTS patients, ECMO patients, and Dialysis patients 2. Is GFR >/= 30 ml/min? Yes.    Patient's GFR today is >60 3. Is SCr </= 2? Yes.   Patient's SCr is 0.65 mg/dL 4. Did SCr increase >/= 0.5 in 24 hours? No. 5.Pt's weight >40kg  Yes.   6. Abnormal electrolyte(s): K, Mag  7. Electrolytes replaced per protocol 8.  Call MD STAT for K+ </= 2.5, Phos </= 1, or Mag </= 1 Physician:  Elisabeth Cara Annebelle Bostic 12/24/2021 4:51 AM

## 2021-12-24 NOTE — Progress Notes (Signed)
[name]. 9:03 AM  Subjective: Patient seen and examined and discussed with his nurse and his son and has had no further GI bleeding and is doing well from a cholecystostomy standpoint and he wants to advance his diet and eat some eggs and has no other complaints  Objective: Vital signs stable afebrile no acute distress abdomen is soft nontender hemoglobin stable platelets decreased liver test decreased  Assessment: Multiple medical problems including bright red blood per rectum probably secondary to decreased platelets increased INR with GI work-up last year  Plan: Okay with me to advance diet to soft solids but may want to check with IR or surgical team to be sure and will ask rounding team to check on tomorrow and we answered the son's questions about cholecystostomy tube as well as possible cholecystectomy in the future  [place] [facility]  office [phone] After 5PM or if no answer call [phone]

## 2021-12-24 NOTE — Progress Notes (Signed)
Interventional Radiology Brief Note:  PA called to unit. Spoke with RN who reports drain remains intact, dressing clean and dry. Tan/green, bilious output in gravity bag.  Patient without complaint of abdominal pain.   Continue current management.   Brynda Greathouse, MS RD PA-C 1:55 PM

## 2021-12-24 NOTE — Progress Notes (Signed)
PCCM note  Patient was transferred out of ICU today. PCCM will sign off. Please re consult as needed.   Marshell Garfinkel MD [facility] See Amion for pager  If no response to pager , please call [phone] until 7pm After 7:00 pm call Elink  [phone] 12/24/2021, 4:48 PM

## 2021-12-24 NOTE — Progress Notes (Addendum)
Daily Progress Note   Patient Name: [name].       Date: 12/24/2021 DOB: 01-Jan-1951  Age: 71 y.o. MRN#: [id number] Attending Physician: Mercy Riding, MD Primary Care Physician: [name], FNP Admit Date: 12/18/2021  Reason for Consultation/Follow-up: Establishing goals of care  Subjective: Now on 4th floor, pain medication needs noted. Discussed with son at bedside, brother also visiting.  Did not rest well overnight.  Chart reviewed.  Not in any acute distress currently.  Length of Stay: 6  Current Medications: Scheduled Meds:   (feeding supplement) PROSource Plus  30 mL Oral Daily   Chlorhexidine Gluconate Cloth  6 each Topical Q0600   feeding supplement  1 Container Oral BID BM   feeding supplement  237 mL Oral Q24H   levothyroxine  50 mcg Oral Q0600   mouth rinse  15 mL Mouth Rinse BID   multivitamin with minerals  1 tablet Oral Daily   mupirocin ointment  1 application. Nasal BID   pantoprazole (PROTONIX) IV  40 mg Intravenous Q12H   sodium chloride flush  5 mL Intracatheter Q8H    Continuous Infusions:  sodium chloride Stopped (12/23/21 0547)   ceFEPime (MAXIPIME) IV Stopped (12/24/21 1047)   metronidazole Stopped (12/24/21 1012)   vancomycin Stopped (12/24/21 1308)    PRN Meds: sodium chloride, acetaminophen, docusate, HYDROmorphone (DILAUDID) injection, LORazepam, metoprolol tartrate, oxyCODONE, polyethylene glycol  Physical Exam         Wake alert oriented resting in bed Appears chronically ill Right AKA No focal deficits Has cholecystostomy drain Regular work of breathing  Vital Signs: BP (!) 145/60 (BP Location: Right Arm)   Pulse 86   Temp 98.5 F (36.9 C) (Oral)   Resp 16   Ht 6' (1.829 m)   Wt 101.4 kg   SpO2 100%   BMI 30.32  kg/m  SpO2: SpO2: 100 % O2 Device: O2 Device: Room Air O2 Flow Rate: O2 Flow Rate (L/min): 2 L/min  Intake/output summary:  Intake/Output Summary (Last 24 hours) at 12/24/2021 1337 Last data filed at 12/24/2021 1111 Gross per 24 hour  Intake 1353.32 ml  Output 1735 ml  Net -381.68 ml   LBM: Last BM Date : 12/23/21 Baseline Weight: Weight: 88.8 kg Most recent weight: Weight: 101.4 kg  Palliative Assessment/Data:      Patient Active Problem List   Diagnosis Date Noted   Acute pain of left thigh 12/23/2021   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   GI bleed 12/23/2021   Scrotal edema 12/23/2021   SVT (supraventricular tachycardia) ([place]) 12/22/2021   Left leg weakness and numbness 12/21/2021   Calculus of bile duct with acute cholecystitis 12/21/2021   Elevated troponin 12/21/2021   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 81/77/1165   Thrombocytopenia ([place]) 12/20/2021   AKI (acute kidney injury) ([place]) 79/09/8331   Acute metabolic encephalopathy 83/29/1916   Elevated liver enzymes 12/20/2021   Hyperbilirubinemia 12/20/2021   Traumatic rhabdomyolysis ([place]) 12/20/2021   Hyperkalemia, hyponatremia and hypomagnesemia 12/20/2021   Hyperkalemia 12/20/2021   Hyponatremia 12/20/2021   Septic shock ([place]) 12/18/2021   Hypomagnesemia 12/16/2020   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    Gastric nodule    Duodenal erythema    Polyp of colon    Left leg swelling 12/14/2020   Eye trauma 02/29/2020   History of pancytopenia 02/29/2020   Portal venous hypertension ([place]) 02/29/2020   S/P AKA (above knee amputation) unilateral ([place]) 02/29/2020   Hepatic cirrhosis due to chronic hepatitis C infection ([place]) 02/29/2020   Goals of care, counseling/discussion 02/29/2020   Disorder of skeletal system 02/29/2020   Physical deconditioning 02/29/2020   HTN (hypertension) 09/14/2019   Hepatic encephalopathy ([place]) 04/07/2018   Osteoarthritis of left knee 02/26/2018    Chronic pain syndrome 11/23/2016   Hypothyroidism 09/25/2016   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   Hepatitis B core antibody positive 05/22/2016   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[place]) 04/14/2015   Cellulitis of right thigh 05/21/2012   Pancytopenia ([place]) 02/26/2012   Encounter for long-term (current) use of other medications 02/26/2012   Screening for prostate cancer 02/26/2012   Hemolytic anemia ([place]) 02/26/2012   Closed fracture of carpal bone 10/12/2009   Dyslipidemia 09/12/2009   Phantom limb syndrome ([place]) 09/12/2009   Hypocalcemia 12/23/2007   ERECTILE DYSFUNCTION, 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  In ED, hypotensive and altered.  Hgb 4.0.  Lactic acid> 9.  AST 495.  ALT 107.  Bili 8.  He was started on Levophed, IV antibiotics and blood transfusion.  Hematology consulted.   RUQ ultrasound concerning for liver cirrhosis and liver mass.    Patient came off Levophed and transferred to [facility] hospitalist service on 5/24.     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  In addition, iver cirrhosis, portal HTN, splenomegaly, ascites. Imaging showed. General surgery consulted and recommended conservative care with IV antibiotics.    6/26-patient was on IV fluid for AKI and rhabdomyolysis but developed significant scrotal edema.  AKI resolved.  IV fluid discontinued.  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  He also had bloody stool later in the day.  Patient was moved to ICU.  PCCM, general surgery and IR consulted.  Antibiotics broadened.   6/27-transfused a unit of FFP and 2 units of PRBC.  Had percutaneous cholecystostomy tube.  Orthopedic surgery and GI consulted, and did not feel surgical intervention is indicated.  Palliative medicine consulted for ongoing CODE STATUS and goals of care discussions.   6/28-now on the fourth floor.  PMT monitoring for pain management needs and for monitoring overall disease trajectory of illness.     Recommendations/Plan: Continue current mode of care Continue current pain and on pain symptom management SNF rehab with palliative on discharge.    Code Status:    Code Status Orders  (From admission, onward)           Start     Ordered   12/18/21 1422  Full code  Continuous        12/18/21 1421           Code Status History     Date Active Date Inactive Code Status Order ID Comments User Context   12/14/2020 1507 12/18/2020 2106 Full Code [id number]  Art[place], MD ED   04/07/2018 2206 04/11/2018 1552 Full Code [id number]  [name], MD Inpatient      Advance Directive Documentation    Flowsheet Row Most Recent Value  Type of Advance Directive Healthcare Power of Attorney  Pre-existing out of facility DNR order (yellow form or pink MOST form) --  "MOST" Form in Place? --       Prognosis:  Guarded   Discharge Planning: [place] for rehab with Palliative care service follow-up  Care plan was discussed with patient, son and brother.   Thank you for allowing the Palliative Medicine Team to assist in the care of this patient.   Time In: 12  Time Out: 12.35 Total Time 35 Prolonged Time Billed No       Greater than 50%  of this time was spent counseling and coordinating care related to the above assessment and plan.  Loistine Chance, MD  Please contact Palliative Medicine Team phone at [phone] for questions and concerns.

## 2021-12-24 NOTE — Progress Notes (Signed)
PROGRESS NOTE  Derek Anon Panuco Jr. DVV:[id number] DOB: 02-Feb-1951   PCP: [name], FNP  Patient is from: Home.  Uses wheelchair at baseline.  DOA: 12/18/2021 LOS: 6  Chief complaints Chief Complaint  Patient presents with   Failure To Thrive     Brief Narrative / Interim history: 71 year old M with PMH of EtOH cirrhosis, nonimmune hemolytic anemia, pancytopenia, right AKA and hepatitis C brought to ED with altered mental status after he slid off his bed and found down on the ground by family, and admitted to ICU with hypovolemic shock, hemolytic anemia, liver failure, encephalopathy, AKI and hyperkalemia.  In ED, hypotensive and altered.  Hgb 4.0.  Lactic acid> 9.  AST 495.  ALT 107.  Bili 8.  He was started on Levophed, IV antibiotics and blood transfusion.  Hematology consulted.  RUQ ultrasound concerning for liver cirrhosis and liver mass.   Patient came off Levophed and transferred to [facility] hospitalist service on 5/24.    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  In addition, iver cirrhosis, portal HTN, splenomegaly, ascites. Imaging showed. General surgery consulted and recommended conservative care with IV antibiotics.   6/26-patient was on IV fluid for AKI and rhabdomyolysis but developed significant scrotal edema.  AKI resolved.  IV fluid discontinued.  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  He also had bloody stool later in the day.  Patient was moved to ICU.  PCCM, general surgery and IR consulted.  Antibiotics broadened.  6/27-transfused a unit of FFP and 2 units of PRBC.  Had  percutaneous cholecystostomy tube.  Orthopedic surgery and GI consulted, and did not feel surgical intervention is indicated.  6/28-patient stable and feels better.  Transferred to progressive care     Subjective: Seen and examined earlier this morning.  He had an episode of confusion and agitation last night.  He pulled off POA, telemetry lines, IV lines and was somewhat inappropriate with nursing staff.  He had mittens applied.  He is oriented and appropriate this morning.  No complaints.  Abdominal pain improved.  No pain in his legs.  No chest pain, dyspnea, nausea or vomiting.  No further bloody stool.  Objective: Vitals:   12/24/21 0700 12/24/21 0800 12/24/21 0804 12/24/21 0900  BP: (!) 150/56 (!) 167/76  140/64  Pulse: 86 92  84  Resp: 18 15  (!) 21  Temp:   99.2 F (37.3 C)   TempSrc:   Axillary   SpO2: 97% 98%  100%  Weight:      Height:        Examination:  GENERAL: No apparent distress.  Nontoxic. HEENT: MMM.  Vision and hearing grossly intact.  NECK: Supple.  No apparent JVD.  RESP: On room air.  No IWOB.  Fair aeration bilaterally. CVS:  RRR. Heart sounds normal.  ABD/GI/GU: BS+. Abd soft, NTND.  Cholecystostomy drain in place.  Scrotal edema. MSK/EXT: Right AKA.  Trace edema in LLE.  DP pulse not palpable.  Slightly firm to palpation over medial aspect of his left thigh but no erythema, loculation or tenderness. SKIN: no apparent skin lesion or wound NEURO: Awake and alert. Oriented appropriately.  No apparent  focal neuro deficit. PSYCH: Calm. Normal affect.   Procedures:  None  Microbiology summarized: 5/22-COVID-19 and influenza PCR nonreactive. 5/24-MRSA PCR screen positive. 5/22-blood cultures NGTD 5/22-urine cultures NGTD 5/27-blood culture NGTD. 5/27-biliary fluid culture with GPC in pairs   Assessment and Plan: * Septic shock ([place]) Tachypneic, hypotensive with AKI, lactic acidosis and thrombocytopenia, and required vasopressors on admission.   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  Blood and urine cultures NGTD.  Biliary fluid culture with GPC in pairs.  Hemodynamically stable.  No leukocytosis -PCCM, general surgery, IR and orthopedic surgery following. -Percutaneous cholecystostomy tube by IR on 5/27 -Antibiotics broadened to vancomycin, cefepime and Flagyl on 5/27 -Transfer to progressive care -Advance diet to full liquid   Acute pain of left thigh CT abdomen and pelvis shows muscular swelling with tracking gas within the proximal muscle compartments likely myonecrosis from traumatic rhabdomyolysis versus neck fasciitis.  CK improving. -Appreciate input by general surgery and orthopedic surgery -Broad-spectrum antibiotics as above -Continue monitoring  SVT (supraventricular tachycardia) (HCC) As symptomatic.  Normotensive.  Resolved with IV metoprolol. -IV metoprolol as needed -Continue telemetry monitoring  Acute metabolic encephalopathy Multifactorial including shock, sepsis, uremia, hepatic encephalopathy or polypharmacy.  Seems to have delirium and sundowning.   -Treat treatable causes -Minimize sedating medications -Reorientation and delirium precautions.  AKI (acute kidney injury) ([place]) Recent Labs    02/03/21 1037 05/22/21 1037 12/18/21 1124 12/18/21 1751 12/19/21 1030 12/20/21 0223 12/21/21 0244 12/22/21 0413 12/22/21 2345 12/24/21 0309  BUN 11 15 38* 42* 56* 63* 42* '22 16 15  '$ CREATININE 0.71 0.54* 1.70* 1.44* 1.70* 1.89* 1.11 0.61 0.55* 0.65  Likely due to hypotension and rhabdomyolysis.  Resolved. -Recheck renal function in the morning -Avoid nephrotoxic meds   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 Elevated liver enzymes/hyperbilirubinemia Recent Labs  Lab 12/20/21 0223 12/21/21 0244 12/22/21 0413 12/22/21 2345 12/24/21 0309  AST 678* 402* 426* 377* 251*  ALT 487*  330* 239* 205* 153*  ALKPHOS 71 77 74 73 63  BILITOT 3.9* 2.2* 1.4* 1.4* 1.4*  PROT 5.7* 5.4* 4.9* 5.0* 5.0*  ALBUMIN 2.5* 2.3* 1.9* 2.0* 2.0*  LFT pattern consistent rhabdo.  CK elevated to 4231> 7800>> 10,700>> 5600.  He has mild coagulopathy as well. 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 LDH markedly elevated.  Haptoglobin low.  Had a splenomegaly and splenic infarction.  Had frank red blood in his stool on 5/26. EGD 5/22 with gastritis and duodenitis.  Colonoscopy in 5/22 with colon polyps, diverticulosis and internal hemorrhoid.  He has significant thrombocytopenia and coagulopathy from liver cirrhosis. -Transfused a total of 6 units PRBC, 2 apheresis of platelet and 1 FFP. [facility] following. -Monitor H&H and transfuse for Hgb <7.0.  Ordered 1 unit. -Advance diet to full liquid  Splenic infarction Wedge-shaped area of hypoenhancement involving the anterior spleen concerning for splenic infarction noted on CT abdomen and pelvis. -Per general surgery.  Left leg weakness and numbness Likely from rhabdomyolysis/myonecrosis of left eye.  ABI with severe PAD but not sure if this is reliable in the setting of rhabdomyolysis/myonecrosis. -Consider vascular consult once stable  -He  may need repeat ABI once he recovers -Closely monitor  Hyperkalemia, hyponatremia and hypomagnesemia Hyperkalemia resolved.  Hyponatremia mild and stable. -IV magnesium sulfate 4 g x 1.  Traumatic rhabdomyolysis ([place]) Likely traumatic from recent fall.  CT showed muscle swelling/tracking gas in left medial thigh.  CK 4231>> 7835> 10,700>> 5600 -Stopped IV fluids due to significant  scrotal edema -Continue monitoring  Thrombocytopenia (HCC) Recent Labs  Lab 12/18/21 1124 12/18/21 2207 12/19/21 0621 12/19/21 1151 12/20/21 0223 12/21/21 0244 12/21/21 1253 12/22/21 0825 12/22/21 2345  PLT 78* 58* 40* 38* 31* 25* 21* 24* 25*  Had 2 apheresis of platelet on 5/27. Transfuse for platelet < 10k or bleeding Continue monitoring   Goals of care, counseling/discussion Remains full code with full scope of care.  See discussion on 5/26 and 5/27 -Palliative medicine following  Abnormal MRI, liver-resolved as of 12/23/2021 RUQ [place] concerning for 6 cm liver mass.  Not appreciated on CT abdomen with contrast.  Physical deconditioning Wheelchair-bound at baseline.  He says he is able to transfer using left leg at baseline. -PT/OT recommended SNF -Left leg evaluation as above  Scrotal edema Likely due to IV fluid in the setting of liver cirrhosis. -IV fluid discontinued -Continue monitoring  Elevated troponin Likely demand ischemia in the setting of sepsis and shock  Calculus of bile duct with acute cholecystitis See shock  Chronic pain syndrome Radius oxycodone to 20 mg every 6 hours.  Per database, feels 120 for 30 days Added IV Dilaudid  Pressure skin injury: POA Pressure Injury 12/18/21 Foot Anterior;Left;Lateral Deep Tissue Pressure Injury - Purple or maroon localized area of discolored intact skin or blood-filled blister due to damage of underlying soft tissue from pressure and/or shear. purple, red (Active)  12/18/21 1530  Location: Foot  Location Orientation: Anterior;Left;Lateral  Staging: Deep Tissue Pressure Injury - Purple or maroon localized area of discolored intact skin or blood-filled blister due to damage of underlying soft tissue from pressure and/or shear.  Wound Description (Comments): purple, red  Present on Admission: Yes  Dressing Type None 12/23/21 2000   DVT prophylaxis:  SCDs Start: 12/18/21 1421  Code Status: Full code Family  Communication: Updated patient's son at bedside. Level of care: Progressive Status is: Inpatient Remains inpatient appropriate because: Septic shock, acute cholecystitis, rhabdomyolysis, acute blood loss anemia and GI bleed   Final disposition: SNF? Consultants:  Pulmonology Oncology/hematology General surgery Orthopedic surgery Gastroenterology Interventional radiology Palliative medicine  Sch Meds:  Scheduled Meds:  (feeding supplement) PROSource Plus  30 mL Oral Daily   Chlorhexidine Gluconate Cloth  6 each Topical Q0600   feeding supplement  1 Container Oral BID BM   feeding supplement  237 mL Oral Q24H   levothyroxine  50 mcg Oral Q0600   mouth rinse  15 mL Mouth Rinse BID   multivitamin with minerals  1 tablet Oral Daily   mupirocin ointment  1 application. Nasal BID   pantoprazole (PROTONIX) IV  40 mg Intravenous Q12H   sodium chloride flush  5 mL Intracatheter Q8H   Continuous Infusions:  sodium chloride Stopped (12/23/21 0547)   ceFEPime (MAXIPIME) IV Stopped (12/24/21 0243)   metronidazole 500 mg (12/24/21 0912)   vancomycin Stopped (12/24/21 0141)   PRN Meds:.sodium chloride, acetaminophen, docusate, HYDROmorphone (DILAUDID) injection, LORazepam, metoprolol tartrate, oxyCODONE, polyethylene glycol  Antimicrobials: Anti-infectives (From admission, onward)    Start     Dose/Rate Route Frequency Ordered Stop   12/23/21 1100  vancomycin (VANCOREADY) IVPB 1250 mg/250 mL  1,250 mg 166.7 mL/hr over 90 Minutes Intravenous Every 12 hours 12/23/21 0922     12/23/21 1000  ceFEPIme (MAXIPIME) 2 g in sodium chloride 0.9 % 100 mL IVPB        2 g 200 mL/hr over 30 Minutes Intravenous Every 8 hours 12/23/21 0922     12/22/21 2048  metroNIDAZOLE (FLAGYL) IVPB 500 mg        500 mg 100 mL/hr over 60 Minutes Intravenous Every 12 hours 12/22/21 2049     12/21/21 1100  cefTRIAXone (ROCEPHIN) 2 g in sodium chloride 0.9 % 100 mL IVPB  Status:  Discontinued        2 g 200  mL/hr over 30 Minutes Intravenous Every 24 hours 12/21/21 0900 12/23/21 0904   12/20/21 1830  metroNIDAZOLE (FLAGYL) IVPB 500 mg  Status:  Discontinued        500 mg 100 mL/hr over 60 Minutes Intravenous Every 12 hours 12/20/21 1727 12/22/21 2049   12/20/21 1100  cefTRIAXone (ROCEPHIN) 1 g in sodium chloride 0.9 % 100 mL IVPB  Status:  Discontinued        1 g 200 mL/hr over 30 Minutes Intravenous Every 24 hours 12/20/21 1006 12/21/21 0900   12/19/21 2000  vancomycin (VANCOREADY) IVPB 1250 mg/250 mL  Status:  Discontinued        1,250 mg 166.7 mL/hr over 90 Minutes Intravenous Every 24 hours 12/18/21 1946 12/19/21 1207   12/19/21 1100  ceFEPIme (MAXIPIME) 2 g in sodium chloride 0.9 % 100 mL IVPB  Status:  Discontinued        2 g 200 mL/hr over 30 Minutes Intravenous Every 12 hours 12/19/21 1038 12/20/21 1006   12/19/21 0600  ceFEPIme (MAXIPIME) 2 g in sodium chloride 0.9 % 100 mL IVPB  Status:  Discontinued        2 g 200 mL/hr over 30 Minutes Intravenous Every 12 hours 12/18/21 1943 12/19/21 1038   12/18/21 1845  vancomycin (VANCOREADY) IVPB 2000 mg/400 mL        2,000 mg 200 mL/hr over 120 Minutes Intravenous STAT 12/18/21 1828 12/18/21 2151   12/18/21 1830  ceFEPIme (MAXIPIME) 2 g in sodium chloride 0.9 % 100 mL IVPB        2 g 200 mL/hr over 30 Minutes Intravenous STAT 12/18/21 1818 12/18/21 1951        I have personally reviewed the following labs and images: CBC: Recent Labs  Lab 12/18/21 1124 12/18/21 2207 12/21/21 0244 12/21/21 1253 12/22/21 0825 12/22/21 1933 12/22/21 2345 12/23/21 1448 12/24/21 0309  WBC 8.1   < > 5.7 5.9 4.1  --  4.6  --  3.9*  NEUTROABS 6.5  --   --   --   --   --   --   --   --   HGB 4.0*   < > 7.1* 7.2* 6.0* 7.6* 7.1* 7.5* 7.6*  HCT 12.5*   < > 20.8* 22.3* 18.4* 22.9* 21.1* 21.8* 22.9*  MCV 105.0*   < > 94.1 97.4 97.4  --  95.5  --  96.2  PLT 78*   < > 25* 21* 24*  --  25*  --  25*   < > = values in this interval not displayed.   BMP  &GFR Recent Labs  Lab 12/19/21 0621 12/19/21 1030 12/20/21 0223 12/21/21 0244 12/22/21 0413 12/22/21 2345 12/24/21 0309  NA  --    < > 134* 134* 133* 134* 133*  K  --    < >  3.9 4.3 3.8 3.8 3.5  CL  --    < > 103 104 103 104 103  CO2  --    < > 21* '24 24 25 26  '$ GLUCOSE  --    < > 203* 148* 164* 147* 149*  BUN  --    < > 63* 42* '22 16 15  '$ CREATININE  --    < > 1.89* 1.11 0.61 0.55* 0.65  CALCIUM  --    < > 7.8* 7.9* 7.6* 7.6* 7.6*  MG 1.8  --  2.3 1.9 1.6* 1.6* 1.5*  PHOS 4.7*  --   --  3.3 2.6 2.8 3.5   < > = values in this interval not displayed.   Estimated Creatinine Clearance: 102 mL/min (by C-G formula based on SCr of 0.65 mg/dL). Liver & Pancreas: Recent Labs  Lab 12/20/21 0223 12/21/21 0244 12/22/21 0413 12/22/21 2345 12/24/21 0309  AST 678* 402* 426* 377* 251*  ALT 487* 330* 239* 205* 153*  ALKPHOS 71 77 74 73 63  BILITOT 3.9* 2.2* 1.4* 1.4* 1.4*  PROT 5.7* 5.4* 4.9* 5.0* 5.0*  ALBUMIN 2.5* 2.3* 1.9* 2.0* 2.0*   Recent Labs  Lab 12/18/21 1124  LIPASE 30   Recent Labs  Lab 12/18/21 1222 12/21/21 0244 12/22/21 0405 12/22/21 2345 12/24/21 0309  AMMONIA 52* 30 20 36* 21   Diabetic: No results for input(s): HGBA1C in the last 72 hours.  Recent Labs  Lab 12/18/21 1544  GLUCAP 59*   Cardiac Enzymes: Recent Labs  Lab 12/20/21 0801 12/21/21 0244 12/22/21 0413 12/22/21 2345 12/24/21 0309  CKTOTAL 4,231* 7,837* 10,683* 8,396* 5,583*   No results for input(s): PROBNP in the last 8760 hours. Coagulation Profile: Recent Labs  Lab 12/18/21 1124 12/21/21 0244 12/22/21 0413 12/22/21 2345 12/24/21 0309  INR 1.8* 1.4* 1.4* 1.3* 1.3*   Thyroid Function Tests: No results for input(s): TSH, T4TOTAL, FREET4, T3FREE, THYROIDAB in the last 72 hours. Lipid Profile: No results for input(s): CHOL, HDL, LDLCALC, TRIG, CHOLHDL, LDLDIRECT in the last 72 hours. Anemia Panel: No results for input(s): VITAMINB12, FOLATE, FERRITIN, TIBC, IRON, RETICCTPCT in  the last 72 hours.  Urine analysis:    Component Value Date/Time   COLORURINE AMBER (A) 12/18/2021 1139   APPEARANCEUR CLEAR 12/18/2021 1139   LABSPEC 1.013 12/18/2021 1139   PHURINE 5.0 12/18/2021 1139   GLUCOSEU NEGATIVE 12/18/2021 1139   GLUCOSEU NEGATIVE 01/06/2015 0912   HGBUR LARGE (A) 12/18/2021 1139   BILIRUBINUR NEGATIVE 12/18/2021 1139   KETONESUR 5 (A) 12/18/2021 1139   PROTEINUR 100 (A) 12/18/2021 1139   UROBILINOGEN 1.0 01/06/2015 0912   NITRITE NEGATIVE 12/18/2021 1139   LEUKOCYTESUR NEGATIVE 12/18/2021 1139   Sepsis Labs: Invalid input(s): PROCALCITONIN, [place]  Microbiology: Recent Results (from the past 240 hour(s))  Blood Culture (routine x 2)     Status: None   Collection Time: 12/18/21 11:24 AM   Specimen: BLOOD  Result Value Ref Range Status   Specimen Description   Final    BLOOD BLOOD RIGHT HAND Performed at [facility] [address] [place], [place] [address]    Special Requests   Final    BOTTLES DRAWN AEROBIC AND ANAEROBIC Blood Culture adequate volume Performed at [facility] [address], [place], [place] [address]    Culture   Final    NO GROWTH 5 DAYS Performed at [facility], [place] [address] [place],  [address]    Report Status 12/23/2021  FINAL  Final  Blood Culture (routine x 2)     Status: None   Collection Time: 12/18/21 11:32 AM   Specimen: BLOOD  Result Value Ref Range Status   Specimen Description   Final    BLOOD RIGHT ANTECUBITAL Performed at [facility] [address] [place], [place] [address]    Special Requests   Final    BOTTLES DRAWN AEROBIC AND ANAEROBIC Blood Culture results may not be optimal due to an excessive volume of blood received in culture bottles Performed at [facility] [address] [place], [place] [address]    Culture   Final    NO GROWTH 5 DAYS Performed at [facility], [place] [address], [place], [place] [address]    Report Status 12/23/2021 FINAL  Final  Urine Culture     Status: None   Collection Time: 12/18/21 11:39 AM   Specimen: In/Out Cath Urine  Result Value Ref Range Status   Specimen Description   Final    IN/OUT CATH URINE Performed at [facility] [address] [place], [place] [address]    Special Requests   Final    NONE Performed at [facility], [place] [address] [place], [place] [address]    Culture   Final    NO GROWTH Performed at [facility], [place] [address] [place], [place] [address]    Report Status 12/20/2021 FINAL  Final  Resp Panel by RT-PCR (Flu A&B, Covid) Nasopharyngeal Swab     Status: None   Collection Time: 12/18/21 12:22 PM   Specimen: Nasopharyngeal Swab; Nasopharyngeal(NP) swabs in vial transport medium  Result Value Ref Range Status   SARS Coronavirus 2 by RT PCR NEGATIVE NEGATIVE Final    Comment: (NOTE) SARS-CoV-2 target nucleic acids are NOT DETECTED.  The SARS-CoV-2 RNA is generally detectable in upper respiratory specimens during the acute phase of infection. The lowest concentration of SARS-CoV-2 viral copies this assay can detect is 138 copies/mL. A negative result does not preclude SARS-Cov-2 infection and should not be used as the sole basis for treatment or other patient management decisions. A negative result may occur with  improper specimen collection/handling, submission of specimen other than nasopharyngeal swab, presence of viral mutation(s) within the areas targeted by this assay, and inadequate number of viral copies(<138 copies/mL). A negative result must be combined with clinical observations, patient history, and epidemiological information. The expected result is Negative.  Fact Sheet for Patients:  EntrepreneurPulse.com.au  Fact Sheet for Healthcare Providers:  IncredibleEmployment.be  This test is no t yet approved or  cleared by the [place] FDA and  has been authorized for detection and/or diagnosis of SARS-CoV-2 by FDA under an Emergency Use Authorization (EUA). This EUA will remain  in effect (meaning this test can be used) for the duration of the COVID-19 declaration under Section 564(b)(1) of the Act, 21 U.S.C.section 360bbb-3(b)(1), unless the authorization is terminated  or revoked sooner.       Influenza A by PCR NEGATIVE NEGATIVE Final   Influenza B by PCR NEGATIVE NEGATIVE Final    Comment: (NOTE) The Xpert Xpress SARS-CoV-2/FLU/RSV plus assay is intended as an aid in the diagnosis of influenza from Nasopharyngeal swab specimens and should not be used as a sole basis for treatment. Nasal washings and aspirates are unacceptable for Xpert Xpress SARS-CoV-2/FLU/RSV testing.  Fact Sheet for Patients: EntrepreneurPulse.com.au  Fact Sheet for Healthcare Providers: IncredibleEmployment.be  This test  is not yet approved or cleared by the [place] and has been authorized for detection and/or diagnosis of SARS-CoV-2 by FDA under an Emergency Use Authorization (EUA). This EUA will remain in effect (meaning this test can be used) for the duration of the COVID-19 declaration under Section 564(b)(1) of the Act, 21 U.S.C. section 360bbb-3(b)(1), unless the authorization is terminated or revoked.  Performed at [facility], [place] [address], [place], [place] [address]   MRSA Next Gen by PCR, Nasal     Status: Abnormal   Collection Time: 12/20/21  9:40 AM   Specimen: Nasal Mucosa; Nasal Swab  Result Value Ref Range Status   MRSA by PCR Next Gen DETECTED (A) NOT DETECTED Final    Comment: (NOTE) The GeneXpert MRSA Assay (FDA approved for NASAL specimens only), is one component of a comprehensive MRSA colonization surveillance program. It is not intended to diagnose MRSA infection nor to guide or monitor treatment for MRSA  infections. Test performance is not FDA approved in patients less than 78 years old. Performed at [facility], [place] [address] [place], [place] [address]   Aerobic/Anaerobic Culture w Gram Stain (surgical/deep wound)     Status: None (Preliminary result)   Collection Time: 12/23/21 11:58 AM   Specimen: BILE  Result Value Ref Range Status   Specimen Description   Final    BILE Performed at [facility] [address], [place], [place] [address]    Special Requests   Final    NONE Performed at [facility], [place] [address] [place], [place] [address]    Gram Stain   Final    ABUNDANT WBC PRESENT, PREDOMINANTLY MONONUCLEAR MODERATE GRAM POSITIVE COCCI IN PAIRS Performed at [facility], [place] [address], [place], [place] [address]    Culture PENDING  Incomplete   Report Status PENDING  Incomplete  Culture, blood (Routine X 2) w Reflex to ID Panel     Status: None (Preliminary result)   Collection Time: 12/23/21  2:48 PM   Specimen: BLOOD  Result Value Ref Range Status   Specimen Description   Final    BLOOD LEFT ANTECUBITAL Performed at [facility], [place] [address], [place], [place] [address]    Special Requests   Final    NONE Performed at [facility], [place] [address], [place], [place] [address]    Culture PENDING  Incomplete   Report Status PENDING  Incomplete  Culture, blood (Routine X 2) w Reflex to ID Panel     Status: None (Preliminary result)   Collection Time: 12/23/21  2:48 PM   Specimen: BLOOD  Result Value Ref Range Status   Specimen Description   Final    BLOOD RIGHT ANTECUBITAL Performed at [facility], [place] [address] [place], [place] [address]    Special Requests   Final    NONE Performed at [facility], [place] [address], [place], [place] [address]    Culture PENDING  Incomplete   Report Status PENDING  Incomplete    Radiology Studies: IR  Perc Cholecystostomy  Result Date: 12/23/2021 INDICATION: 71 year old gentleman with acute cholecystitis and sepsis presents to IR for cholecystostomy drain placement EXAM: Ultrasound and fluoroscopy guided cholecystostomy drain placement MEDICATIONS: Patient on inpatient course of antibiotics. ANESTHESIA/SEDATION: Moderate (conscious) sedation was employed during this procedure. A total of Versed 2 mg and Fentanyl 100 mcg was administered intravenously. Moderate Sedation Time: 12  minutes. The patient's level of consciousness and vital signs were monitored continuously by radiology nursing throughout the procedure under my direct supervision. FLUOROSCOPY TIME:  Radiation Exposure Index (as provided by the fluoroscopic device): 22 mGy Kerma COMPLICATIONS: None immediate. PROCEDURE: Informed written consent was obtained from the patient after a thorough discussion of the procedural risks, benefits and alternatives. All questions were addressed. Maximal Sterile Barrier Technique was utilized including caps, mask, sterile gowns, sterile gloves, sterile drape, hand hygiene and skin antiseptic. A timeout was performed prior to the initiation of the procedure. Patient positioned supine on the procedure table. Right upper quadrant skin prepped and draped usual fashion. Following local lidocaine administration, the gallbladder was accessed at the level of the fundus with a 21 gauge needle utilizing continuous ultrasound guidance. 21 gauge needle exchanged for a transitional dilator set over 0.018 inch guidewire. Transitional dilator set removed over 0.035 inch guidewire. Tract dilation performed and a 10.2 [place] multipurpose pigtail drain was placed. 40 mL appearing material was aspirated. Samples were sent for Gram stain and culture. Contrast administered through the drain under fluoroscopy confirmed appropriate positioning of the pigtail within the gallbladder lumen. Multiple filling defects were consistent with  gallstones. Drain secured to skin with suture and connected to bag. IMPRESSION: 10.2 [place] multipurpose pigtail drain placed in gallbladder. Electronically Signed   By: Miachel Roux M.D.   On: 12/23/2021 13:53   DG FEMUR PORT MIN 2 VIEWS LEFT  Result Date: 12/23/2021 CLINICAL DATA:  Leg pain.  Fall. EXAM: LEFT FEMUR PORTABLE 2 VIEWS COMPARISON:  CT abdomen/pelvis Dec 22, 2021. FINDINGS: No evidence of acute fracture. Vascular calcifications. Gas and swelling in the left thigh better characterized on recent CT abdomen/pelvis. IMPRESSION: 1. No evidence of acute fracture. 2. Gas and swelling in the left thigh better characterized on recent CT abdomen/pelvis. Electronically Signed   By: Margaretha Sheffield M.D.   On: 12/23/2021 13:40      Calaya Gildner T. Clayton  If 7PM-7AM, please contact night-coverage [email or web address] 12/24/2021, 9:56 AM

## 2021-12-24 NOTE — Plan of Care (Signed)
  Problem: Clinical Measurements: Goal: Respiratory complications will improve Outcome: Progressing   Problem: Nutrition: Goal: Adequate nutrition will be maintained Outcome: Progressing   Problem: Coping: Goal: Level of anxiety will decrease Outcome: Progressing   Problem: Elimination: Goal: Will not experience complications related to bowel motility Outcome: Progressing Goal: Will not experience complications related to urinary retention Outcome: Progressing   Problem: Pain Managment: Goal: General experience of comfort will improve Outcome: Progressing

## 2021-12-24 NOTE — Progress Notes (Signed)
Patient ID: [name]., male   DOB: [birth year], 71 y.o.   MRN: [id number]  Patient had percutaneous cholecystostomy tube placed yesterday. WBC/ LFT's/ Ammonia improved Ortho has evaluated Left thigh  Continue IV antibiotics Diet as tolerated  We will recheck on Tuesday.  Imogene Burn. Georgette Dover, MD, [facility]  General Surgery   12/24/2021 7:23 AM

## 2021-12-24 NOTE — Progress Notes (Signed)
OT Cancellation Note  Patient Details Name: Crosley Stejskal Mealy [name]. MRN: [id number] DOB: [birth year]   Cancelled Treatment:    Reason Eval/Treat Not Completed: Patient declined, no reason specified Patient declined to participate reporting having recently transitioned rooms out of ICU. OT to continue to follow and check back as schedule will allow.  Jackelyn Poling OTR/L, [facility] Office# [phone] Pager# [phone] 12/24/2021, 12:09 PM

## 2021-12-24 NOTE — NC FL2 (Signed)
[place] LEVEL OF CARE SCREENING TOOL     IDENTIFICATION  Patient Name: [name]. Birthdate: November 24, 1950 Sex: male Admission Date (Current Location): 12/18/2021  [place] and [place] Number:  [name] [id number] [place] and Address:  [facility],  [place], [place]      Provider Number: [id number]  Attending Physician Name and Address:  Mercy Riding, MD  Relative Name and Phone Number:  [name] (son) Ph: [phone]    Current Level of Care: Hospital Recommended Level of Care: [place] Prior Approval Number:    Date Approved/Denied:   PASRR Number: [id number]  Discharge Plan: SNF    Current Diagnoses: Patient Active Problem List   Diagnosis Date Noted   Acute pain of left thigh 12/23/2021   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   GI bleed 12/23/2021   Scrotal edema 12/23/2021   SVT (supraventricular tachycardia) ([place]) 12/22/2021   Left leg weakness and numbness 12/21/2021   Calculus of bile duct with acute cholecystitis 12/21/2021   Elevated troponin 12/21/2021   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 09/32/3557   Thrombocytopenia ([place]) 12/20/2021   AKI (acute kidney injury) ([place]) 32/20/2542   Acute metabolic encephalopathy 70/62/3762   Elevated liver enzymes 12/20/2021   Hyperbilirubinemia 12/20/2021   Traumatic rhabdomyolysis ([place]) 12/20/2021   Hyperkalemia, hyponatremia and hypomagnesemia 12/20/2021   Hyperkalemia 12/20/2021   Hyponatremia 12/20/2021   Septic shock ([place]) 12/18/2021   Hypomagnesemia 12/16/2020   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    Gastric nodule    Duodenal erythema    Polyp of colon    Left leg swelling 12/14/2020   Eye trauma 02/29/2020   History of pancytopenia 02/29/2020   Portal venous hypertension ([place]) 02/29/2020   S/P AKA (above knee amputation) unilateral ([place]) 02/29/2020   Hepatic cirrhosis due to chronic hepatitis C  infection ([place]) 02/29/2020   Goals of care, counseling/discussion 02/29/2020   Disorder of skeletal system 02/29/2020   Physical deconditioning 02/29/2020   HTN (hypertension) 09/14/2019   Hepatic encephalopathy ([place]) 04/07/2018   Osteoarthritis of left knee 02/26/2018   Chronic pain syndrome 11/23/2016   Hypothyroidism 09/25/2016   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   Hepatitis B core antibody positive 05/22/2016   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[place]) 04/14/2015   Cellulitis of right thigh 05/21/2012   Pancytopenia ([place]) 02/26/2012   Encounter for long-term (current) use of other medications 02/26/2012   Screening for prostate cancer 02/26/2012   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   Closed fracture of carpal bone 10/12/2009   Dyslipidemia 09/12/2009   Phantom limb syndrome ([place]) 09/12/2009   Hypocalcemia 12/23/2007   ERECTILE DYSFUNCTION, 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   History of nephrolithiasis 12/23/2007   Anxiety 03/24/2007    Orientation RESPIRATION BLADDER Height & Weight     Self, Situation, Place  Normal Incontinent Weight: 223 lb 8.7 oz (101.4 kg) Height:  6' (182.9 cm)  BEHAVIORAL SYMPTOMS/MOOD NEUROLOGICAL BOWEL NUTRITION STATUS   (N/A)   Incontinent Diet (Dysphagia 3 diet)  AMBULATORY STATUS COMMUNICATION OF NEEDS Skin   Limited Assist Verbally Other (Comment), Skin abrasions (Abrasion: left leg & left elbow; Ecchymosis: bilateral arms & legs, abdomen)                       Personal Care Assistance Level of Assistance  Bathing, Feeding, Dressing Bathing Assistance: Limited assistance Feeding assistance: Limited assistance Dressing Assistance: Limited assistance     Functional Limitations Info  Sight, Hearing, Speech Sight Info: Impaired Hearing Info: Adequate Speech Info: Adequate    SPECIAL CARE FACTORS FREQUENCY  PT (By licensed PT), OT (By licensed OT)     PT Frequency: 5x's/week OT Frequency: 5x's/week            Contractures  Contractures Info: Not present    Additional Factors Info  Code Status, Allergies Code Status Info: Full Allergies Info: Clindamycin           Current Medications (12/24/2021):  This is the current hospital active medication list Current Facility-Administered Medications  Medication Dose Route Frequency Provider Last Rate Last Admin   (feeding supplement) PROSource Plus liquid 30 mL  30 mL Oral Daily Cyndia Skeeters, Taye T, MD   30 mL at 12/23/21 2116   0.9 %  sodium chloride infusion   Intravenous PRN Mercy Riding, MD   Stopped at 12/23/21 0547   acetaminophen (TYLENOL) tablet 650 mg  650 mg Oral Q6H PRN Wendee Beavers T, MD   650 mg at 12/20/21 2232   ceFEPIme (MAXIPIME) 2 g in sodium chloride 0.9 % 100 mL IVPB  2 g Intravenous Q8H Gonfa, Taye T, MD 200 mL/hr at 12/24/21 1732 2 g at 12/24/21 1732   Chlorhexidine Gluconate Cloth 2 % PADS 6 each  6 each Topical Q0600 Mercy Riding, MD   6 each at 12/23/21 2315   docusate (COLACE) 50 MG/5ML liquid 100 mg  100 mg Oral BID PRN Mercy Riding, MD       feeding supplement (BOOST / RESOURCE BREEZE) liquid 1 Container  1 Container Oral BID BM Mercy Riding, MD   1 Container at 12/24/21 1723   feeding supplement (ENSURE ENLIVE / ENSURE PLUS) liquid 237 mL  237 mL Oral Q24H Gonfa, Taye T, MD   237 mL at 12/24/21 1306   HYDROmorphone (DILAUDID) injection 0.5 mg  0.5 mg Intravenous Q4H PRN Wendee Beavers T, MD   0.5 mg at 12/24/21 1722   levothyroxine (SYNTHROID) tablet 50 mcg  50 mcg Oral Q0600 Wendee Beavers T, MD   50 mcg at 12/24/21 0519   LORazepam (ATIVAN) tablet 0.5 mg  0.5 mg Oral Q6H PRN Wendee Beavers T, MD   0.5 mg at 12/24/21 0519   MEDLINE mouth rinse  15 mL Mouth Rinse BID Wendee Beavers T, MD   15 mL at 12/24/21 0906   metoprolol tartrate (LOPRESSOR) injection 5 mg  5 mg Intravenous Q6H PRN Wendee Beavers T, MD   5 mg at 12/24/21 0810   metroNIDAZOLE (FLAGYL) IVPB 500 mg  500 mg Intravenous Q12H Mercy Riding, MD   Stopped at 12/24/21 1012   multivitamin with  minerals tablet 1 tablet  1 tablet Oral Daily Mercy Riding, MD   1 tablet at 12/24/21 0906   mupirocin ointment (BACTROBAN) 2 % 1 application.  1 application. Nasal BID Mercy Riding, MD   1 application. at 12/24/21 0906   oxyCODONE (Oxy IR/ROXICODONE) immediate release tablet 20 mg  20 mg Oral Q6H PRN Wendee Beavers T, MD   20 mg at 12/24/21 1304   pantoprazole (PROTONIX) injection 40 mg  40 mg Intravenous Q12H Gonfa, Taye T, MD   40 mg at 12/24/21 0906   polyethylene glycol (MIRALAX / GLYCOLAX) packet 17 g  17 g Oral Daily PRN Gonfa, Taye T, MD       sodium chloride flush (NS) 0.9 % injection 5 mL  5 mL Intracatheter Q8H Mercy Riding, MD  5 mL at 12/24/21 1306   vancomycin (VANCOREADY) IVPB 1250 mg/250 mL  1,250 mg Intravenous Q12H Mercy Riding, MD   Stopped at 12/24/21 1308     Discharge Medications: Please see discharge summary for a list of discharge medications.  Relevant Imaging Results:  Relevant Lab Results:   Additional Information SSN: [id number]  Ross Ludwig, LCSW

## 2021-12-24 NOTE — Progress Notes (Addendum)
Subjective:     Patient reports pain as mild in left thigh - he notes this feels improved since yesterday after being repositioned in bed. No new numbness/tingling in the LLE. Patient underwent percutaneous cholecystostomy tube placement yesterday with improvement in bloodwork. He remains on IV antibiotics.    Objective:   VITALS:  Temp:  [97.4 F (36.3 C)-99.5 F (37.5 C)] 99.2 F (37.3 C) (05/28 0804) Pulse Rate:  [78-97] 90 (05/28 0600) Resp:  [12-25] 16 (05/28 0600) BP: (117-179)/(46-127) 135/65 (05/28 0600) SpO2:  [94 %-100 %] 96 % (05/28 0600) Weight:  [99.5 kg] 99.5 kg (05/28 0500)  Gen: AAOx3, NAD Comfortable at rest   Left Lower Extremity: Skin intact, mild swelling No erythema or blistering anywhere in LLE No crepitus to palpation No TTP over thigh or rest of LLE Limited motor function to LLE (baseline per pt) SILT over thigh but grossly numb below knee (baseline per pt) DP, PT 2+ to palp CR < 2s   LABS Recent Labs    12/22/21 0825 12/22/21 1933 12/22/21 2345 12/23/21 1448 12/24/21 0309  HGB 6.0* 7.6* 7.1* 7.5* 7.6*  WBC 4.1  --  4.6  --  3.9*  PLT 24*  --  25*  --  25*   Recent Labs    12/22/21 2345 12/24/21 0309  NA 134* 133*  K 3.8 3.5  CL 104 103  CO2 25 26  BUN 16 15  CREATININE 0.55* 0.65  GLUCOSE 147* 149*   Recent Labs    12/22/21 2345 12/24/21 0309  INR 1.3* 1.3*     Assessment/Plan: Ortho consult for left thigh myonecrosis following being found down with rhabdomyloysis in setting of extensive PMH including hepatic cirrhosis and ongoing cholecystitis   -exam and clinical picture remains inconsistent with deep infection of the left thigh. The patient has resolving pain in this left thigh and likely has myonecrosis secondary to being found down in rhabdo. The patient has also been improving clinically and weaned off pressors at this time -CT from 12/20/21 shows essentially stable findings in the left thigh without interim progression  on most recent CT from yesterday -XR of the left thigh shows no fracture or gas -as discussed with the general surgery and medical/critical care teams, the patient remains a very poor surgical candidate given his severe thrombocytopenia, liver failure, ongoing cholecystitis as well as general deconditioned state and minimal function of the LLE at baseline -will continue to monitor clinical exam, vitals and labwork at this time -patient, his POA and family understand the critical situation and agree with plan of care   Armond Hang 12/24/2021, 8:56 AM

## 2021-12-24 NOTE — TOC Progression Note (Signed)
Transition of Care (TOC) - Progression Note    Patient Details  Name: [name]. MRN: [id number] Date of Birth: 03-29-1951  Transition of Care [facility]) CM/SW Contact  Ross Ludwig, [place] Phone Number: 12/24/2021, 5:59 PM  Clinical Narrative:     Updated clinicals sent to SNFs awaiting bed offers.  Expected Discharge Plan: [place] Barriers to Discharge: Continued Medical Work up  Expected Discharge Plan and Services Expected Discharge Plan: [place] In-house Referral: Clinical Social Work   Post Acute Care Choice: [place] Living arrangements for the past 2 months: Apartment                 DME Arranged: N/A DME Agency: NA                   Social Determinants of Health (SDOH) Interventions    Readmission Risk Interventions     View : No data to display.

## 2021-12-25 DIAGNOSIS — N179 Acute kidney failure, unspecified: Secondary | ICD-10-CM | POA: Diagnosis not present

## 2021-12-25 DIAGNOSIS — R571 Hypovolemic shock: Secondary | ICD-10-CM | POA: Diagnosis not present

## 2021-12-25 DIAGNOSIS — K7031 Alcoholic cirrhosis of liver with ascites: Secondary | ICD-10-CM | POA: Diagnosis not present

## 2021-12-25 DIAGNOSIS — G9341 Metabolic encephalopathy: Secondary | ICD-10-CM | POA: Diagnosis not present

## 2021-12-25 LAB — COMPREHENSIVE METABOLIC PANEL
ALT: 125 U/L — ABNORMAL HIGH (ref 0–44)
AST: 193 U/L — ABNORMAL HIGH (ref 15–41)
Albumin: 1.9 g/dL — ABNORMAL LOW (ref 3.5–5.0)
Alkaline Phosphatase: 54 U/L (ref 38–126)
Anion gap: 6 (ref 5–15)
BUN: 17 mg/dL (ref 8–23)
CO2: 24 mmol/L (ref 22–32)
Calcium: 7.8 mg/dL — ABNORMAL LOW (ref 8.9–10.3)
Chloride: 104 mmol/L (ref 98–111)
Creatinine, Ser: 0.56 mg/dL — ABNORMAL LOW (ref 0.61–1.24)
GFR, Estimated: >60 mL/min (ref >60)
Glucose, Bld: 138 mg/dL — ABNORMAL HIGH (ref 70–99)
Potassium: 3.7 mmol/L (ref 3.5–5.1)
Sodium: 134 mmol/L — ABNORMAL LOW (ref 135–145)
Total Bilirubin: 1.6 mg/dL — ABNORMAL HIGH (ref 0.3–1.2)
Total Protein: 5 g/dL — ABNORMAL LOW (ref 6.5–8.1)

## 2021-12-25 LAB — CBC
HCT: 22.4 % — ABNORMAL LOW (ref 39.0–52.0)
Hemoglobin: 7.5 g/dL — ABNORMAL LOW (ref 13.0–17.0)
MCH: 32.1 pg (ref 26.0–34.0)
MCHC: 33.5 g/dL (ref 30.0–36.0)
MCV: 95.7 fL (ref 80.0–100.0)
Platelets: 25 10*3/uL — CL (ref 150–400)
RBC: 2.34 MIL/uL — ABNORMAL LOW (ref 4.22–5.81)
RDW: 18.4 % — ABNORMAL HIGH (ref 11.5–15.5)
WBC: 4 10*3/uL (ref 4.0–10.5)
nRBC: 0 % (ref 0.0–0.2)

## 2021-12-25 LAB — CK: Total CK: 4082 U/L — ABNORMAL HIGH (ref 49–397)

## 2021-12-25 LAB — PROTIME-INR
INR: 1.4 — ABNORMAL HIGH (ref 0.8–1.2)
Prothrombin Time: 17.2 seconds — ABNORMAL HIGH (ref 11.4–15.2)

## 2021-12-25 LAB — AMMONIA: Ammonia: 33 umol/L (ref 9–35)

## 2021-12-25 LAB — MAGNESIUM: Magnesium: 1.4 mg/dL — ABNORMAL LOW (ref 1.7–2.4)

## 2021-12-25 LAB — PHOSPHORUS: Phosphorus: 3.2 mg/dL (ref 2.5–4.6)

## 2021-12-25 MED ORDER — MAGNESIUM SULFATE 4 GM/100ML IV SOLN
4.0000 g | Freq: Once | INTRAVENOUS | Status: AC
  Administered 2021-12-25: 4 g via INTRAVENOUS
  Filled 2021-12-25: qty 100

## 2021-12-25 MED ORDER — ALPRAZOLAM 1 MG PO TABS
1.0000 mg | ORAL_TABLET | Freq: Three times a day (TID) | ORAL | Status: DC | PRN
  Administered 2021-12-25 – 2022-01-04 (×27): 1 mg via ORAL
  Filled 2021-12-25 (×28): qty 1

## 2021-12-25 MED ORDER — LORAZEPAM 2 MG/ML IJ SOLN
0.5000 mg | INTRAMUSCULAR | Status: DC | PRN
  Administered 2021-12-25: 0.5 mg via INTRAVENOUS
  Filled 2021-12-25 (×2): qty 1

## 2021-12-25 MED ORDER — HALOPERIDOL LACTATE 5 MG/ML IJ SOLN: 2.0000 mg | Freq: Four times a day (QID) | INTRAMUSCULAR | Status: DC | PRN

## 2021-12-25 MED ORDER — ALPRAZOLAM 1 MG PO TABS
1.0000 mg | ORAL_TABLET | Freq: Three times a day (TID) | ORAL | Status: DC
Start: 2021-12-25 — End: 2021-12-25

## 2021-12-25 MED ORDER — HALOPERIDOL LACTATE 5 MG/ML IJ SOLN
1.0000 mg | Freq: Four times a day (QID) | INTRAMUSCULAR | Status: DC | PRN
  Administered 2021-12-25: 1 mg via INTRAMUSCULAR
  Filled 2021-12-25: qty 1

## 2021-12-25 MED ORDER — QUETIAPINE FUMARATE 25 MG PO TABS
25.0000 mg | ORAL_TABLET | Freq: Every day | ORAL | Status: DC
  Administered 2021-12-25 – 2022-01-04 (×11): 25 mg via ORAL
  Filled 2021-12-25 (×11): qty 1

## 2021-12-25 NOTE — Progress Notes (Signed)
[facility] Progress Note  Anthonyjames Bargar Blundell Jr. 71 y.o. 06-27-1951  CC: Cirrhosis, anemia   Subjective: Patient seen and examined at bedside.  No overnight acute issues noted.  No overt bleeding.  ROS : Afebrile.  Negative for chest pain.   Objective: Vital signs in last 24 hours: Vitals:   12/24/21 2005 12/24/21 2252  BP: (!) 144/70 (!) 144/63  Pulse: 86 89  Resp: 20   Temp: 98.4 F (36.9 C) 99.3 F (37.4 C)  SpO2: 99% 99%    Physical Exam:  General -resting comfortably.  Not in acute distress Abdomen -right upper quadrant drain noted with bilious fluid in it.  Mild abdominal discomfort on palpation.  No peritoneal signs.  Bowel sounds present.   Lab Results: Recent Labs    12/24/21 0309 12/25/21 0317  NA 133* 134*  K 3.5 3.7  CL 103 104  CO2 26 24  GLUCOSE 149* 138*  BUN 15 17  CREATININE 0.65 0.56*  CALCIUM 7.6* 7.8*  MG 1.5* 1.4*  PHOS 3.5 3.2   Recent Labs    12/24/21 0309 12/25/21 0317  AST 251* 193*  ALT 153* 125*  ALKPHOS 63 54  BILITOT 1.4* 1.6*  PROT 5.0* 5.0*  ALBUMIN 2.0* 1.9*   Recent Labs    12/24/21 0309 12/25/21 0317  WBC 3.9* 4.0  HGB 7.6* 7.5*  HCT 22.9* 22.4*  MCV 96.2 95.7  PLT 25* 25*   Recent Labs    12/24/21 0309 12/25/21 0317  LABPROT 16.3* 17.2*  INR 1.3* 1.4*      Assessment/Plan: -Bright red blood per rectum in setting of severe thrombocytopenia with platelet count of 25.  Colonoscopy last year in May 2022 at [facility] showed few small polyps which were not removed because of thrombocytopenia, and hemorrhoids. -History of cirrhosis.  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  No bleeding. -Cholecystectomy.  S/p IR guided cholecystostomy tube placement.  Recommendations ------------------------- -Patient not a candidate for EGD or colonoscopy because of severe thrombocytopenia.  No overt bleeding. -No further inpatient GI work-up planned at this time. -Recommend follow-up with  primary GI at [facility] after discharge for further management of his cirrhosis and colonic polyps. -GI will sign off.  Call us back if needed.  Diet advancement per surgical team.   Otis Brace MD, [place] 12/25/2021, 10:41 AM  Contact #  [phone]
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
## 2021-12-25 NOTE — Progress Notes (Signed)
PROGRESS NOTE  Derek Anon Hasten Jr. ZMO:[id number] DOB: 1950/10/22   PCP: [name], FNP  Patient is from: Home.  Uses wheelchair at baseline.  DOA: 12/18/2021 LOS: 7  Chief complaints Chief Complaint  Patient presents with   Failure To Thrive     Brief Narrative / Interim history: 71 year old M with PMH of EtOH cirrhosis, nonimmune hemolytic anemia, pancytopenia, right AKA and hepatitis C brought to ED with altered mental status after he slid off his bed and found down on the ground by family, and admitted to ICU with hypovolemic shock, hemolytic anemia, liver failure, encephalopathy, AKI and hyperkalemia.  In ED, hypotensive and altered.  Hgb 4.0.  Lactic acid> 9.  AST 495.  ALT 107.  Bili 8.  He was started on Levophed, IV antibiotics and blood transfusion. Hematology consulted. Patient came off Levophed and transferred to [facility] hospitalist service on 5/24.    RUQ [place] concerning for liver cirrhosis and liver mass. 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  Liver cirrhosis, portal HTN, splenomegaly, ascites. General surgery consulted and recommended conservative care with IV antibiotics.   6/26-patient was on IV fluid for AKI and rhabdomyolysis but developed significant scrotal edema.  AKI resolved.  IV fluid discontinued.  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  He also had bloody stool later in the day.  Patient was moved to ICU.  PCCM, general surgery and IR consulted.  Antibiotics broadened.  6/27-transfused a unit of FFP and 2 units of PRBC.  Had  percutaneous cholecystostomy tube.  Orthopedic surgery and GI consulted, and did not feel surgical intervention is indicated.  6/28-Transferred to progressive care. Now with delirium, mostly at night     Subjective: Seen and examined earlier this morning.  Patient was confused, agitated and combative overnight.  He required mittens, IV Haldol and tele sitter.  He appears calm this morning.  No complaints.   Objective: Vitals:   12/24/21 1440 12/24/21 1819 12/24/21 2005 12/24/21 2252  BP: (!) 125/58 138/70 (!) 144/70 (!) 144/63  Pulse: 87 93 86 89  Resp: '16 17 20   '$ Temp:  99.5 F (37.5 C) 98.4 F (36.9 C) 99.3 F (37.4 C)  TempSrc:  Oral Oral Oral  SpO2: 99% 100% 99% 99%  Weight:      Height:        Examination:  GENERAL: No apparent distress.  Nontoxic. HEENT: MMM.  Vision and hearing grossly intact.  NECK: Supple.  No apparent JVD.  RESP:  No IWOB.  Fair aeration bilaterally. CVS:  RRR. Heart sounds normal.  ABD/GI/GU: BS+. Abd soft, NTND.  RUQ drain with bile.  Significant scrotal swelling.  No tenderness. MSK/EXT: Right AKA.  Some degree of LLE contracture at the knee.  Faint DP pulses SKIN: LLE skin change consistent with some degree of venous insufficiency. NEURO: Sleepy but wakes to voice.  Fairly oriented.  No apparent focal neuro deficit. PSYCH: Calm. Normal affect.   Procedures:  None  Microbiology summarized: 5/22-COVID-19 and influenza PCR nonreactive. 5/24-MRSA PCR screen positive. 5/22-blood cultures NGTD 5/22-urine cultures NGTD 5/27-blood culture NGTD. 5/27-biliary fluid culture with GPC in pairs  Assessment and Plan: * Septic shock ([place]) Tachypneic, hypotensive with AKI, lactic acidosis and thrombocytopenia, and required vasopressors on admission.  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  Blood and urine cultures NGTD.  Biliary fluid culture with GPC in  pairs.  Hemodynamically stable.  No leukocytosis -Percutaneous cholecystostomy tube by IR on 5/27 -CTX and Flagyl >>>vancomycin, cefepime and Flagyl on 5/27>>> -Tolerating soft diet. -General surgery following.   Acute pain of left thigh/rhabdomyolysis/myonecrosis CT abdomen and pelvis shows muscular swelling with tracking gas within the proximal muscle compartments likely myonecrosis from traumatic rhabdomyolysis versus neck fasciitis.  CK improving. -Appreciate input by general surgery and orthopedic surgery -Broad-spectrum antibiotics as above but low suspicion for infection here -Continue monitoring  SVT (supraventricular tachycardia) (HCC) As symptomatic.  Normotensive.  Resolved with IV metoprolol. -IV metoprolol as needed -Continue telemetry monitoring  Acute metabolic encephalopathy Initially, patient was in septic shock with uremia and some degree of hepatic encephalopathy.  Now he seems to be having delirium.  He is at risk for polypharmacy as well.  He is confused, agitated and combative at night. -Seroquel 25 mg HS and IV Haldol 2 mg q6h PRN-risk and benefit discussed with sons -No QTc 455 on recent EKG.  Will get another EKG today -Treat treatable causes -Minimize sedating medications -Reorientation and delirium precautions.  AKI (acute kidney injury) ([place]) Recent Labs    02/03/21 1037 05/22/21 1037 12/18/21 1124 12/18/21 1751 12/19/21 1030 12/20/21 0223 12/21/21 0244 12/22/21 0413 12/22/21 2345 12/24/21 0309  BUN 11 15 38* 42* 56* 63* 42* '22 16 15  '$ CREATININE 0.71 0.54* 1.70* 1.44* 1.70* 1.89* 1.11 0.61 0.55* 0.65  Likely due to hypotension and rhabdomyolysis.  Resolved. -Recheck renal function in the morning -Avoid nephrotoxic meds   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 Elevated liver enzymes/hyperbilirubinemia Recent Labs  Lab 12/21/21 0244 12/22/21 0413 12/22/21 2345 12/24/21 0309 12/25/21 0317  AST 402* 426* 377* 251*  193*  ALT 330* 239* 205* 153* 125*  ALKPHOS 77 74 73 63 54  BILITOT 2.2* 1.4* 1.4* 1.4* 1.6*  PROT 5.4* 4.9* 5.0* 5.0* 5.0*  ALBUMIN 2.3* 1.9* 2.0* 2.0* 1.9*  LFT pattern consistent rhabdo.  Rhabdo improving.  He has mild coagulopathy as well. 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 LDH markedly elevated.  Haptoglobin low.  Had a splenomegaly and splenic infarction.  Had frank red blood in his stool on 5/26. EGD 5/22 with gastritis and duodenitis.  Colonoscopy in 5/22 with colon polyps, diverticulosis and internal hemorrhoid.  He has significant thrombocytopenia and coagulopathy from liver cirrhosis. -Transfused a total of 6 units PRBC, 2 apheresis of platelet and 1 FFP so far. [facility] following. -Monitor H&H and transfuse for Hgb <7.0.   Splenic infarction Wedge-shaped area of hypoenhancement involving the anterior spleen concerning for splenic infarction noted on CT abdomen and pelvis. -Per general surgery.  Left leg weakness and numbness Likely from rhabdomyolysis/myonecrosis of left thigh. ABI with severe PAD but not sure if this is reliable in the setting of rhabdomyolysis/myonecrosis. -He may need repeat ABI once he recovers -Follow-up with vascular surgery outpatient -Closely monitor  Hyperkalemia, hyponatremia and hypomagnesemia Hyperkalemia resolved.  Hyponatremia mild and stable.  Mg 1.4. -IV magnesium sulfate 4 g x 1.  Thrombocytopenia ([place]) Recent Labs  Lab 12/18/21 2207 12/19/21 0621 12/19/21 1151 12/20/21 0223 12/21/21 0244 12/21/21 1253 12/22/21 0825 12/22/21 2345 12/24/21 0309 12/25/21 0317  PLT 58* 40* 38* 31* 25* 21* 24* 25* 25*  25*  Had 2 apheresis of platelet on 5/27. Transfuse for platelet < 10k or bleeding Continue monitoring   Goals of care, counseling/discussion Remains full code with full scope of care.  See discussion on 5/26 and 5/27 -Palliative medicine following  Abnormal MRI, liver-resolved as of 12/23/2021 RUQ [place] concerning for 6 cm liver mass.  Not appreciated on CT abdomen with contrast.  Physical deconditioning Wheelchair-bound at baseline.  He says he is able to transfer using left leg at baseline. -PT/OT recommended SNF -Left leg evaluation as above  Scrotal edema Likely due to IV fluid in the setting of liver cirrhosis. IV fluid discontinued -Continue monitoring  Elevated troponin Likely demand ischemia in the setting of sepsis and shock  Calculus of bile duct with acute cholecystitis See shock  Chronic pain syndrome Radius oxycodone to 20 mg every 6 hours.  Per database, feels 120 for 30 days Added IV Dilaudid  Pressure skin injury: POA Pressure Injury 12/18/21 Foot Anterior;Left;Lateral Deep Tissue Pressure Injury - Purple or maroon localized area of discolored intact skin or blood-filled blister due to damage of underlying soft tissue from pressure and/or shear. purple, red (Active)  12/18/21 1530  Location: Foot  Location Orientation: Anterior;Left;Lateral  Staging: Deep Tissue Pressure Injury - Purple or maroon localized area of discolored intact skin or blood-filled blister due to damage of underlying soft tissue from pressure and/or shear.  Wound Description (Comments): purple, red  Present on Admission: Yes  Dressing Type Foam - Lift dressing to assess site every shift 12/24/21 1955   DVT prophylaxis:  SCDs Start: 12/18/21 1421  Code Status: Full code Family Communication: Updated patient's sons at bedside. Level of care: Progressive Status is: Inpatient Remains inpatient appropriate because: Septic shock, acute cholecystitis, rhabdomyolysis, acute blood loss anemia  and GI bleed   Final disposition: SNF? Consultants:  Pulmonology-signed off Oncology/hematology General surgery Orthopedic surgery Gastroenterology-signed off Interventional radiology Palliative medicine  Sch Meds:  Scheduled Meds:  (feeding supplement) PROSource Plus  30 mL Oral Daily   Chlorhexidine Gluconate Cloth  6 each Topical Q0600   feeding supplement  1 Container Oral BID BM   feeding supplement  237 mL Oral Q24H   levothyroxine  50 mcg Oral Q0600   mouth rinse  15 mL Mouth Rinse BID   multivitamin with minerals  1 tablet Oral Daily   pantoprazole (PROTONIX) IV  40 mg Intravenous Q12H   QUEtiapine  25 mg Oral QHS   sodium chloride flush  5 mL Intracatheter Q8H   Continuous Infusions:  sodium chloride Stopped (12/23/21 0547)   ceFEPime (MAXIPIME) IV 2 g (12/25/21 1028)   magnesium sulfate bolus IVPB 4 g (12/25/21 0941)   metronidazole 500 mg (12/25/21 0900)   vancomycin 1,250 mg (12/24/21 2314)   PRN Meds:.sodium chloride, acetaminophen, docusate, haloperidol lactate, HYDROmorphone (DILAUDID) injection, LORazepam, LORazepam, metoprolol tartrate, oxyCODONE, polyethylene glycol  Antimicrobials: Anti-infectives (From admission, onward)    Start     Dose/Rate Route Frequency Ordered Stop   12/23/21 1100  vancomycin (VANCOREADY) IVPB 1250 mg/250 mL        1,250 mg 166.7 mL/hr over 90 Minutes Intravenous Every 12 hours 12/23/21  9833     12/23/21 1000  ceFEPIme (MAXIPIME) 2 g in sodium chloride 0.9 % 100 mL IVPB        2 g 200 mL/hr over 30 Minutes Intravenous Every 8 hours 12/23/21 0922     12/22/21 2048  metroNIDAZOLE (FLAGYL) IVPB 500 mg        500 mg 100 mL/hr over 60 Minutes Intravenous Every 12 hours 12/22/21 2049     12/21/21 1100  cefTRIAXone (ROCEPHIN) 2 g in sodium chloride 0.9 % 100 mL IVPB  Status:  Discontinued        2 g 200 mL/hr over 30 Minutes Intravenous Every 24 hours 12/21/21 0900 12/23/21 0904   12/20/21 1830  metroNIDAZOLE (FLAGYL) IVPB 500 mg   Status:  Discontinued        500 mg 100 mL/hr over 60 Minutes Intravenous Every 12 hours 12/20/21 1727 12/22/21 2049   12/20/21 1100  cefTRIAXone (ROCEPHIN) 1 g in sodium chloride 0.9 % 100 mL IVPB  Status:  Discontinued        1 g 200 mL/hr over 30 Minutes Intravenous Every 24 hours 12/20/21 1006 12/21/21 0900   12/19/21 2000  vancomycin (VANCOREADY) IVPB 1250 mg/250 mL  Status:  Discontinued        1,250 mg 166.7 mL/hr over 90 Minutes Intravenous Every 24 hours 12/18/21 1946 12/19/21 1207   12/19/21 1100  ceFEPIme (MAXIPIME) 2 g in sodium chloride 0.9 % 100 mL IVPB  Status:  Discontinued        2 g 200 mL/hr over 30 Minutes Intravenous Every 12 hours 12/19/21 1038 12/20/21 1006   12/19/21 0600  ceFEPIme (MAXIPIME) 2 g in sodium chloride 0.9 % 100 mL IVPB  Status:  Discontinued        2 g 200 mL/hr over 30 Minutes Intravenous Every 12 hours 12/18/21 1943 12/19/21 1038   12/18/21 1845  vancomycin (VANCOREADY) IVPB 2000 mg/400 mL        2,000 mg 200 mL/hr over 120 Minutes Intravenous STAT 12/18/21 1828 12/18/21 2151   12/18/21 1830  ceFEPIme (MAXIPIME) 2 g in sodium chloride 0.9 % 100 mL IVPB        2 g 200 mL/hr over 30 Minutes Intravenous STAT 12/18/21 1818 12/18/21 1951        I have personally reviewed the following labs and images: CBC: Recent Labs  Lab 12/18/21 1124 12/18/21 2207 12/21/21 1253 12/22/21 0825 12/22/21 1933 12/22/21 2345 12/23/21 1448 12/24/21 0309 12/25/21 0317  WBC 8.1   < > 5.9 4.1  --  4.6  --  3.9* 4.0  NEUTROABS 6.5  --   --   --   --   --   --   --   --   HGB 4.0*   < > 7.2* 6.0* 7.6* 7.1* 7.5* 7.6* 7.5*  HCT 12.5*   < > 22.3* 18.4* 22.9* 21.1* 21.8* 22.9* 22.4*  MCV 105.0*   < > 97.4 97.4  --  95.5  --  96.2 95.7  PLT 78*   < > 21* 24*  --  25*  --  25* 25*   < > = values in this interval not displayed.   BMP &GFR Recent Labs  Lab 12/21/21 0244 12/22/21 0413 12/22/21 2345 12/24/21 0309 12/25/21 0317  NA 134* 133* 134* 133* 134*  K  4.3 3.8 3.8 3.5 3.7  CL 104 103 104 103 104  CO2 '24 24 25 26 24  '$ GLUCOSE 148* 164* 147* 149*  138*  BUN 42* '22 16 15 17  '$ CREATININE 1.11 0.61 0.55* 0.65 0.56*  CALCIUM 7.9* 7.6* 7.6* 7.6* 7.8*  MG 1.9 1.6* 1.6* 1.5* 1.4*  PHOS 3.3 2.6 2.8 3.5 3.2   Estimated Creatinine Clearance: 102.8 mL/min (A) (by C-G formula based on SCr of 0.56 mg/dL (L)). Liver & Pancreas: Recent Labs  Lab 12/21/21 0244 12/22/21 0413 12/22/21 2345 12/24/21 0309 12/25/21 0317  AST 402* 426* 377* 251* 193*  ALT 330* 239* 205* 153* 125*  ALKPHOS 77 74 73 63 54  BILITOT 2.2* 1.4* 1.4* 1.4* 1.6*  PROT 5.4* 4.9* 5.0* 5.0* 5.0*  ALBUMIN 2.3* 1.9* 2.0* 2.0* 1.9*   Recent Labs  Lab 12/18/21 1124  LIPASE 30   Recent Labs  Lab 12/21/21 0244 12/22/21 0405 12/22/21 2345 12/24/21 0309 12/25/21 0317  AMMONIA 30 20 36* 21 33   Diabetic: No results for input(s): HGBA1C in the last 72 hours.  Recent Labs  Lab 12/18/21 1544  GLUCAP 59*   Cardiac Enzymes: Recent Labs  Lab 12/21/21 0244 12/22/21 0413 12/22/21 2345 12/24/21 0309 12/25/21 0317  CKTOTAL 7,837* 10,683* 8,396* 5,583* 4,082*   No results for input(s): PROBNP in the last 8760 hours. Coagulation Profile: Recent Labs  Lab 12/21/21 0244 12/22/21 0413 12/22/21 2345 12/24/21 0309 12/25/21 0317  INR 1.4* 1.4* 1.3* 1.3* 1.4*   Thyroid Function Tests: No results for input(s): TSH, T4TOTAL, FREET4, T3FREE, THYROIDAB in the last 72 hours. Lipid Profile: No results for input(s): CHOL, HDL, LDLCALC, TRIG, CHOLHDL, LDLDIRECT in the last 72 hours. Anemia Panel: No results for input(s): VITAMINB12, FOLATE, FERRITIN, TIBC, IRON, RETICCTPCT in the last 72 hours.  Urine analysis:    Component Value Date/Time   COLORURINE AMBER (A) 12/18/2021 1139   APPEARANCEUR CLEAR 12/18/2021 1139   LABSPEC 1.013 12/18/2021 1139   PHURINE 5.0 12/18/2021 1139   GLUCOSEU NEGATIVE 12/18/2021 1139   GLUCOSEU NEGATIVE 01/06/2015 0912   HGBUR LARGE (A)  12/18/2021 1139   BILIRUBINUR NEGATIVE 12/18/2021 1139   KETONESUR 5 (A) 12/18/2021 1139   PROTEINUR 100 (A) 12/18/2021 1139   UROBILINOGEN 1.0 01/06/2015 0912   NITRITE NEGATIVE 12/18/2021 1139   LEUKOCYTESUR NEGATIVE 12/18/2021 1139   Sepsis Labs: Invalid input(s): PROCALCITONIN, [place]  Microbiology: Recent Results (from the past 240 hour(s))  Blood Culture (routine x 2)     Status: None   Collection Time: 12/18/21 11:24 AM   Specimen: BLOOD  Result Value Ref Range Status   Specimen Description   Final    BLOOD BLOOD RIGHT HAND Performed at [facility] [address] [place], [place] [address]    Special Requests   Final    BOTTLES DRAWN AEROBIC AND ANAEROBIC Blood Culture adequate volume Performed at [facility] [address], [place], [place] [address]    Culture   Final    NO GROWTH 5 DAYS Performed at [facility], [place] [address] [place], [place] [address]    Report Status 12/23/2021 FINAL  Final  Blood Culture (routine x 2)     Status: None   Collection Time: 12/18/21 11:32 AM   Specimen: BLOOD  Result Value Ref Range Status   Specimen Description   Final    BLOOD RIGHT ANTECUBITAL Performed at [facility] [address], [place], [place] [address]    Special Requests   Final    BOTTLES DRAWN AEROBIC AND ANAEROBIC Blood Culture results may not be optimal due to an excessive volume  of blood received in culture bottles Performed at [facility], [place] [address] [place], [place] [address]    Culture   Final    NO GROWTH 5 DAYS Performed at [facility], [place] [address] [place], [place] [address]    Report Status 12/23/2021 FINAL  Final  Urine Culture     Status: None   Collection Time: 12/18/21 11:39 AM   Specimen: In/Out Cath Urine  Result Value Ref Range Status   Specimen Description   Final    IN/OUT CATH URINE Performed at [facility] [address] [place], [place] [address]    Special Requests   Final    NONE Performed at [facility], [place] [address] [place], [place] [address]    Culture   Final    NO GROWTH Performed at [facility], [place] [address] [place], [place] [address]    Report Status 12/20/2021 FINAL  Final  Resp Panel by RT-PCR (Flu A&B, Covid) Nasopharyngeal Swab     Status: None   Collection Time: 12/18/21 12:22 PM   Specimen: Nasopharyngeal Swab; Nasopharyngeal(NP) swabs in vial transport medium  Result Value Ref Range Status   SARS Coronavirus 2 by RT PCR NEGATIVE NEGATIVE Final    Comment: (NOTE) SARS-CoV-2 target nucleic acids are NOT DETECTED.  The SARS-CoV-2 RNA is generally detectable in upper respiratory specimens during the acute phase of infection. The lowest concentration of SARS-CoV-2 viral copies this assay can detect is 138 copies/mL. A negative result does not preclude SARS-Cov-2 infection and should not be used as the sole basis for treatment or other patient management decisions. A negative result may occur with  improper specimen collection/handling, submission of specimen other than nasopharyngeal swab, presence of viral mutation(s) within the areas targeted by this assay, and inadequate number of viral copies(<138 copies/mL). A negative result must be combined with clinical observations, patient history, and epidemiological information. The expected result is Negative.  Fact Sheet for Patients:  EntrepreneurPulse.com.au  Fact Sheet for Healthcare Providers:  IncredibleEmployment.be  This test is no t yet approved or cleared by the [place] FDA and  has been authorized for detection and/or diagnosis of SARS-CoV-2 by FDA under an Emergency Use Authorization (EUA). This EUA will remain  in effect (meaning this test can be used) for the duration of the COVID-19 declaration under Section 564(b)(1) of the Act,  21 U.S.C.section 360bbb-3(b)(1), unless the authorization is terminated  or revoked sooner.       Influenza A by PCR NEGATIVE NEGATIVE Final   Influenza B by PCR NEGATIVE NEGATIVE Final    Comment: (NOTE) The Xpert Xpress SARS-CoV-2/FLU/RSV plus assay is intended as an aid in the diagnosis of influenza from Nasopharyngeal swab specimens and should not be used as a sole basis for treatment. Nasal washings and aspirates are unacceptable for Xpert Xpress SARS-CoV-2/FLU/RSV testing.  Fact Sheet for Patients: EntrepreneurPulse.com.au  Fact Sheet for Healthcare Providers: IncredibleEmployment.be  This test is not yet approved or cleared by the [place] FDA and has been authorized for detection and/or diagnosis of SARS-CoV-2 by FDA under an Emergency Use Authorization (EUA). This EUA will remain in effect (meaning this test can be used) for the duration of the COVID-19 declaration under Section 564(b)(1) of the Act, 21 U.S.C. section 360bbb-3(b)(1), unless the authorization is terminated or revoked.  Performed at [facility], [place] [address] [place], [place] [address]   MRSA Next Gen by PCR,  Nasal     Status: Abnormal   Collection Time: 12/20/21  9:40 AM   Specimen: Nasal Mucosa; Nasal Swab  Result Value Ref Range Status   MRSA by PCR Next Gen DETECTED (A) NOT DETECTED Final    Comment: (NOTE) The GeneXpert MRSA Assay (FDA approved for NASAL specimens only), is one component of a comprehensive MRSA colonization surveillance program. It is not intended to diagnose MRSA infection nor to guide or monitor treatment for MRSA infections. Test performance is not FDA approved in patients less than 70 years old. Performed at [facility], [place] [address], [place], [place] [address]   Aerobic/Anaerobic Culture w Gram Stain (surgical/deep wound)     Status: None (Preliminary result)   Collection Time: 12/23/21  11:58 AM   Specimen: BILE  Result Value Ref Range Status   Specimen Description   Final    BILE Performed at [facility] [address], [place], [place] [address]    Special Requests   Final    NONE Performed at [facility], [place] [address], [place], [place] [address]    Gram Stain   Final    ABUNDANT WBC PRESENT, PREDOMINANTLY MONONUCLEAR MODERATE GRAM POSITIVE COCCI IN PAIRS    Culture   Final    CULTURE REINCUBATED FOR BETTER GROWTH Performed at [facility], [place] [address] [name], [place] [address]    Report Status PENDING  Incomplete  Culture, blood (Routine X 2) w Reflex to ID Panel     Status: None (Preliminary result)   Collection Time: 12/23/21  2:48 PM   Specimen: BLOOD  Result Value Ref Range Status   Specimen Description   Final    BLOOD LEFT ANTECUBITAL Performed at [facility], [place] [address], [place], [place] [address]    Special Requests   Final    NONE Performed at [facility], [place] [address] [place], [place] [address]    Culture   Final    NO GROWTH 2 DAYS Performed at [facility] [address], [place], [place] [address]    Report Status PENDING  Incomplete  Culture, blood (Routine X 2) w Reflex to ID Panel     Status: None (Preliminary result)   Collection Time: 12/23/21  2:48 PM   Specimen: BLOOD  Result Value Ref Range Status   Specimen Description   Final    BLOOD RIGHT ANTECUBITAL Performed at [facility], [place] [address] [place], [place] [address]    Special Requests   Final    NONE Performed at [facility], [place] [address] [place], [place] [address]    Culture   Final    NO GROWTH 2 DAYS Performed at [facility] [address], [place], [place] [address]    Report Status PENDING  Incomplete    Radiology Studies: No results found.    Kanae Ignatowski T. [place]  If 7PM-7AM, please contact  night-coverage [email or web address] 12/25/2021, 11:14 AM

## 2021-12-25 NOTE — Progress Notes (Signed)
Subjective/Chief Complaint: Pt transferred out of ICU.  Perc chole tube placed and is functional. Per sons, patient is moving more.     Objective: Vital signs in last 24 hours: Temp:  [98.4 F (36.9 C)-99.5 F (37.5 C)] 99.3 F (37.4 C) (05/28 2252) Pulse Rate:  [86-93] 89 (05/28 2252) Resp:  [14-20] 20 (05/28 2005) BP: (125-145)/(58-70) 144/63 (05/28 2252) SpO2:  [98 %-100 %] 99 % (05/28 2252) Weight:  [101.4 kg] 101.4 kg (05/28 1110) Last BM Date : 12/23/21  Intake/Output from previous day: 05/28 0701 - 05/29 0700 In: 564.6 [IV Piggyback:564.6] Out: 36 [Urine:500; Drains:160] Intake/Output this shift: Total I/O In: -  Out: 800 [Urine:800]  Obese male - NAD Sleepy, interacts some Abd - soft, non tender; negative Murphy's sign, drain bile non murky Skin - pale  Lab Results:  Recent Labs    12/24/21 0309 12/25/21 0317  WBC 3.9* 4.0  HGB 7.6* 7.5*  HCT 22.9* 22.4*  PLT 25* 25*   BMET Recent Labs    12/24/21 0309 12/25/21 0317  NA 133* 134*  K 3.5 3.7  CL 103 104  CO2 26 24  GLUCOSE 149* 138*  BUN 15 17  CREATININE 0.65 0.56*  CALCIUM 7.6* 7.8*   PT/INR Recent Labs    12/24/21 0309 12/25/21 0317  LABPROT 16.3* 17.2*  INR 1.3* 1.4*      Latest Ref Rng & Units 12/25/2021    3:17 AM 12/24/2021    3:09 AM 12/22/2021   11:45 PM  Hepatic Function  Total Protein 6.5 - 8.1 g/dL 5.0   5.0   5.0    Albumin 3.5 - 5.0 g/dL 1.9   2.0   2.0    AST 15 - 41 U/L 193   251   377    ALT 0 - 44 U/L 125   153   205    Alk Phosphatase 38 - 126 U/L 54   63   73    Total Bilirubin 0.3 - 1.2 mg/dL 1.6   1.4   1.4       Studies/Results: IR Perc Cholecystostomy  Result Date: 12/23/2021 INDICATION: 71 year old gentleman with acute cholecystitis and sepsis presents to IR for cholecystostomy drain placement EXAM: Ultrasound and fluoroscopy guided cholecystostomy drain placement MEDICATIONS: Patient on inpatient course of antibiotics. ANESTHESIA/SEDATION: Moderate  (conscious) sedation was employed during this procedure. A total of Versed 2 mg and Fentanyl 100 mcg was administered intravenously. Moderate Sedation Time: 12 minutes. The patient's level of consciousness and vital signs were monitored continuously by radiology nursing throughout the procedure under my direct supervision. FLUOROSCOPY TIME:  Radiation Exposure Index (as provided by the fluoroscopic device): 22 mGy Kerma COMPLICATIONS: None immediate. PROCEDURE: Informed written consent was obtained from the patient after a thorough discussion of the procedural risks, benefits and alternatives. All questions were addressed. Maximal Sterile Barrier Technique was utilized including caps, mask, sterile gowns, sterile gloves, sterile drape, hand hygiene and skin antiseptic. A timeout was performed prior to the initiation of the procedure. Patient positioned supine on the procedure table. Right upper quadrant skin prepped and draped usual fashion. Following local lidocaine administration, the gallbladder was accessed at the level of the fundus with a 21 gauge needle utilizing continuous ultrasound guidance. 21 gauge needle exchanged for a transitional dilator set over 0.018 inch guidewire. Transitional dilator set removed over 0.035 inch guidewire. Tract dilation performed and a 10.2 [place] multipurpose pigtail drain was placed. 40 mL appearing material was aspirated. Samples were  sent for Gram stain and culture. Contrast administered through the drain under fluoroscopy confirmed appropriate positioning of the pigtail within the gallbladder lumen. Multiple filling defects were consistent with gallstones. Drain secured to skin with suture and connected to bag. IMPRESSION: 10.2 [place] multipurpose pigtail drain placed in gallbladder. Electronically Signed   By: Miachel Roux M.D.   On: 12/23/2021 13:53   DG FEMUR PORT MIN 2 VIEWS LEFT  Result Date: 12/23/2021 CLINICAL DATA:  Leg pain.  Fall. EXAM: LEFT FEMUR PORTABLE 2  VIEWS COMPARISON:  CT abdomen/pelvis Dec 22, 2021. FINDINGS: No evidence of acute fracture. Vascular calcifications. Gas and swelling in the left thigh better characterized on recent CT abdomen/pelvis. IMPRESSION: 1. No evidence of acute fracture. 2. Gas and swelling in the left thigh better characterized on recent CT abdomen/pelvis. Electronically Signed   By: Margaretha Sheffield M.D.   On: 12/23/2021 13:40    Anti-infectives: Anti-infectives (From admission, onward)    Start     Dose/Rate Route Frequency Ordered Stop   12/23/21 1100  vancomycin (VANCOREADY) IVPB 1250 mg/250 mL        1,250 mg 166.7 mL/hr over 90 Minutes Intravenous Every 12 hours 12/23/21 0922     12/23/21 1000  ceFEPIme (MAXIPIME) 2 g in sodium chloride 0.9 % 100 mL IVPB        2 g 200 mL/hr over 30 Minutes Intravenous Every 8 hours 12/23/21 0922     12/22/21 2048  metroNIDAZOLE (FLAGYL) IVPB 500 mg        500 mg 100 mL/hr over 60 Minutes Intravenous Every 12 hours 12/22/21 2049     12/21/21 1100  cefTRIAXone (ROCEPHIN) 2 g in sodium chloride 0.9 % 100 mL IVPB  Status:  Discontinued        2 g 200 mL/hr over 30 Minutes Intravenous Every 24 hours 12/21/21 0900 12/23/21 0904   12/20/21 1830  metroNIDAZOLE (FLAGYL) IVPB 500 mg  Status:  Discontinued        500 mg 100 mL/hr over 60 Minutes Intravenous Every 12 hours 12/20/21 1727 12/22/21 2049   12/20/21 1100  cefTRIAXone (ROCEPHIN) 1 g in sodium chloride 0.9 % 100 mL IVPB  Status:  Discontinued        1 g 200 mL/hr over 30 Minutes Intravenous Every 24 hours 12/20/21 1006 12/21/21 0900   12/19/21 2000  vancomycin (VANCOREADY) IVPB 1250 mg/250 mL  Status:  Discontinued        1,250 mg 166.7 mL/hr over 90 Minutes Intravenous Every 24 hours 12/18/21 1946 12/19/21 1207   12/19/21 1100  ceFEPIme (MAXIPIME) 2 g in sodium chloride 0.9 % 100 mL IVPB  Status:  Discontinued        2 g 200 mL/hr over 30 Minutes Intravenous Every 12 hours 12/19/21 1038 12/20/21 1006   12/19/21 0600   ceFEPIme (MAXIPIME) 2 g in sodium chloride 0.9 % 100 mL IVPB  Status:  Discontinued        2 g 200 mL/hr over 30 Minutes Intravenous Every 12 hours 12/18/21 1943 12/19/21 1038   12/18/21 1845  vancomycin (VANCOREADY) IVPB 2000 mg/400 mL        2,000 mg 200 mL/hr over 120 Minutes Intravenous STAT 12/18/21 1828 12/18/21 2151   12/18/21 1830  ceFEPIme (MAXIPIME) 2 g in sodium chloride 0.9 % 100 mL IVPB        2 g 200 mL/hr over 30 Minutes Intravenous STAT 12/18/21 1818 12/18/21 1951       Assessment/Plan: 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  Recommend follow up with hepatology and surgery at tertiary care center.   LOS: 7 days    Stark Klein 12/25/2021

## 2021-12-25 NOTE — Progress Notes (Signed)
Physical Therapy Treatment Patient Details Name: [name]. MRN: [id number] DOB: [birth year] Today's Date: 12/25/2021   History of Present Illness 71 year-old M with PMH of EtOH cirrhosis, nonimmune hemolytic anemia, pancytopenia, right AKA and hepatitis C brought to ED 12/18/21 with altered mental status after he slid off his bed and found down on the ground.. Patient  admitted to ICU with hypovolemic shock, hemolytic anemia, liver failure, encephalopathy, AKI and hyperkalemia.    PT Comments    Pt more lethargic this session and wearing mittens.  Pt agreeable to exercises however did not assist in performing them.  Pt also drifting into sleep as well as attempting to pull off mittens when awake - RN notified.      Recommendations for follow up therapy are one component of a multi-disciplinary discharge planning process, led by the attending physician.  Recommendations may be updated based on patient status, additional functional criteria and insurance authorization.  Follow Up Recommendations  Skilled nursing-short term rehab (<3 hours/day)     Assistance Recommended at Discharge Frequent or constant Supervision/Assistance  Patient can return home with the following Two people to help with walking and/or transfers;Assist for transportation;Help with stairs or ramp for entrance;A lot of help with bathing/dressing/bathroom   Equipment Recommendations  None recommended by PT    Recommendations for Other Services       Precautions / Restrictions Precautions Precautions: Fall Precaution Comments: R AKA, profound  weakness  left  hip, knee, demonstrates no ankle dorsi/plantar flex,  lacks P/AROM Knee extension appearing contracted;  per patient, contracture PTA(WC dep)also Restrictions Other Position/Activity Restrictions: will not be able to stand on Left leg     Mobility  Bed Mobility               General bed mobility comments: not attempted as pt not assisting with  prompts and cues for performing supine exercises    Transfers                        Ambulation/Gait                   Stairs             Wheelchair Mobility    Modified Rankin (Stroke Patients Only)       Balance                                            Cognition Arousal/Alertness: Lethargic Behavior During Therapy: Flat affect Overall Cognitive Status: Impaired/Different from baseline                                 General Comments: pt lethargic and answering no/yes questions; pt stating yes to exercises and assisting however did not assist, pt attempting to remove left mitten and did manage this and would not allow therapist to reapply so RN notified        Exercises General Exercises - Lower Extremity Ankle Circles/Pumps: 10 reps, PROM, Left, Limitations Ankle Circles/Pumps Limitations: edematous left LE, unable to achieve full passive PF Quad Sets: PROM, Limitations, Left, 10 reps Quad Sets Limitations: knee flexion contracture Heel Slides: PROM, Left, 10 reps Hip ABduction/ADduction: Left, 10 reps, PROM    General Comments        Pertinent  Vitals/Pain      Home Living                          Prior Function            PT Goals (current goals can now be found in the care plan section) Progress towards PT goals: Not progressing toward goals - comment (limited by cognition, appears more lethargic today, received Ativan last night)    Frequency    Min 2X/week      PT Plan Current plan remains appropriate    Co-evaluation              AM-PAC PT "6 Clicks" Mobility   Outcome Measure  Help needed turning from your back to your side while in a flat bed without using bedrails?: A Lot Help needed moving from lying on your back to sitting on the side of a flat bed without using bedrails?: Total Help needed moving to and from a bed to a chair (including a wheelchair)?:  Total Help needed standing up from a chair using your arms (e.g., wheelchair or bedside chair)?: Total Help needed to walk in hospital room?: Total Help needed climbing 3-5 steps with a railing? : Total 6 Click Score: 7    End of Session   Activity Tolerance: Patient tolerated treatment well Patient left: in bed;with call bell/phone within reach;with bed alarm set   PT Visit Diagnosis: Muscle weakness (generalized) (M62.81);History of falling (Z91.81)     Time: 2637-8588 PT Time Calculation (min) (ACUTE ONLY): 11 min  Charges:  $Therapeutic Exercise: 8-22 mins                     Jannette Spanner PT, DPT Acute Rehabilitation Services Pager: [phone] Office: [place] 12/25/2021, 1:37 PM

## 2021-12-26 DIAGNOSIS — K7031 Alcoholic cirrhosis of liver with ascites: Secondary | ICD-10-CM | POA: Diagnosis not present

## 2021-12-26 DIAGNOSIS — N179 Acute kidney failure, unspecified: Secondary | ICD-10-CM | POA: Diagnosis not present

## 2021-12-26 DIAGNOSIS — G9341 Metabolic encephalopathy: Secondary | ICD-10-CM | POA: Diagnosis not present

## 2021-12-26 DIAGNOSIS — R571 Hypovolemic shock: Secondary | ICD-10-CM | POA: Diagnosis not present

## 2021-12-26 LAB — TYPE AND SCREEN
ABO/RH(D): B NEG
Antibody Screen: POSITIVE
Unit division: 0
Unit division: 0
Unit division: 0

## 2021-12-26 LAB — PREPARE RBC (CROSSMATCH)

## 2021-12-26 LAB — BPAM RBC
Blood Product Expiration Date: 202306232359
Blood Product Expiration Date: 202306292359
Blood Product Expiration Date: 202306302359
ISSUE DATE / TIME: 202305261547
ISSUE DATE / TIME: 202305270800
Unit Type and Rh: 1700
Unit Type and Rh: 9500
Unit Type and Rh: 9500

## 2021-12-26 LAB — COMPREHENSIVE METABOLIC PANEL
ALT: 89 U/L — ABNORMAL HIGH (ref 0–44)
AST: 111 U/L — ABNORMAL HIGH (ref 15–41)
Albumin: 1.8 g/dL — ABNORMAL LOW (ref 3.5–5.0)
Alkaline Phosphatase: 47 U/L (ref 38–126)
Anion gap: 6 (ref 5–15)
BUN: 16 mg/dL (ref 8–23)
CO2: 24 mmol/L (ref 22–32)
Calcium: 7.5 mg/dL — ABNORMAL LOW (ref 8.9–10.3)
Chloride: 104 mmol/L (ref 98–111)
Creatinine, Ser: 0.41 mg/dL — ABNORMAL LOW (ref 0.61–1.24)
GFR, Estimated: >60 mL/min (ref >60)
Glucose, Bld: 118 mg/dL — ABNORMAL HIGH (ref 70–99)
Potassium: 3.6 mmol/L (ref 3.5–5.1)
Sodium: 134 mmol/L — ABNORMAL LOW (ref 135–145)
Total Bilirubin: 1.3 mg/dL — ABNORMAL HIGH (ref 0.3–1.2)
Total Protein: 4.7 g/dL — ABNORMAL LOW (ref 6.5–8.1)

## 2021-12-26 LAB — HEMOGLOBIN AND HEMATOCRIT, BLOOD
HCT: 31.9 % — ABNORMAL LOW (ref 39.0–52.0)
Hemoglobin: 10.5 g/dL — ABNORMAL LOW (ref 13.0–17.0)

## 2021-12-26 LAB — CBC
HCT: 20.2 % — ABNORMAL LOW (ref 39.0–52.0)
Hemoglobin: 6.7 g/dL — CL (ref 13.0–17.0)
MCH: 31.8 pg (ref 26.0–34.0)
MCHC: 33.2 g/dL (ref 30.0–36.0)
MCV: 95.7 fL (ref 80.0–100.0)
Platelets: 22 10*3/uL — CL (ref 150–400)
RBC: 2.11 MIL/uL — ABNORMAL LOW (ref 4.22–5.81)
RDW: 18.4 % — ABNORMAL HIGH (ref 11.5–15.5)
WBC: 3 10*3/uL — ABNORMAL LOW (ref 4.0–10.5)
nRBC: 0 % (ref 0.0–0.2)

## 2021-12-26 LAB — PHOSPHORUS: Phosphorus: 3.4 mg/dL (ref 2.5–4.6)

## 2021-12-26 LAB — CK: Total CK: 2204 U/L — ABNORMAL HIGH (ref 49–397)

## 2021-12-26 LAB — PROTIME-INR
INR: 1.4 — ABNORMAL HIGH (ref 0.8–1.2)
Prothrombin Time: 17.3 seconds — ABNORMAL HIGH (ref 11.4–15.2)

## 2021-12-26 LAB — AMMONIA: Ammonia: 32 umol/L (ref 9–35)

## 2021-12-26 LAB — MAGNESIUM: Magnesium: 1.6 mg/dL — ABNORMAL LOW (ref 1.7–2.4)

## 2021-12-26 MED ORDER — MAGNESIUM SULFATE 2 GM/50ML IV SOLN
2.0000 g | Freq: Once | INTRAVENOUS | Status: AC
Start: 2021-12-26 — End: 2021-12-26
  Administered 2021-12-26: 2 g via INTRAVENOUS
  Filled 2021-12-26: qty 50

## 2021-12-26 MED ORDER — SODIUM CHLORIDE 0.9% IV SOLUTION: Freq: Once | INTRAVENOUS | Status: AC

## 2021-12-26 MED ORDER — LACTULOSE 10 GM/15ML PO SOLN
20.0000 g | Freq: Two times a day (BID) | ORAL | Status: DC
  Administered 2021-12-26 – 2022-01-04 (×15): 20 g via ORAL
  Filled 2021-12-26 (×19): qty 30

## 2021-12-26 MED ORDER — ENSURE ENLIVE PO LIQD
237.0000 mL | Freq: Two times a day (BID) | ORAL | Status: DC
Start: 2021-12-27 — End: 2022-01-05
  Administered 2021-12-27 – 2022-01-04 (×12): 237 mL via ORAL

## 2021-12-26 MED ORDER — MAGNESIUM SULFATE 2 GM/50ML IV SOLN
2.0000 g | Freq: Once | INTRAVENOUS | Status: AC
  Administered 2021-12-26: 2 g via INTRAVENOUS
  Filled 2021-12-26: qty 50

## 2021-12-26 MED ORDER — SODIUM CHLORIDE 0.9 % IV SOLN
3.0000 g | Freq: Four times a day (QID) | INTRAVENOUS | Status: DC
  Administered 2021-12-26 – 2021-12-29 (×13): 3 g via INTRAVENOUS
  Filled 2021-12-26 (×14): qty 8

## 2021-12-26 NOTE — Progress Notes (Signed)
OT Cancellation Note  Patient Details Name: [name]. MRN: [id number] DOB: [birth year]   Cancelled Treatment:    Reason Eval/Treat Not Completed: Other (comment) (per RN request to hold OT tx on this date as pt is sleeping and has required meds for agitation. RN requests pt continue to rest. OT will reattempt as able.Shanon Payor, OTD OTR/L  12/26/21, 8:58 AM

## 2021-12-26 NOTE — Assessment & Plan Note (Signed)
Patient is adamant about his Xanax despite risk for polypharmacy and encephalopathy. -Resumed.

## 2021-12-26 NOTE — Progress Notes (Signed)
PROGRESS NOTE  Derek Anon Sollars Jr. QAS:[id number] DOB: 02/18/1951   PCP: [name], FNP  Patient is from: Home.  Uses wheelchair at baseline.  DOA: 12/18/2021 LOS: 8  Chief complaints Chief Complaint  Patient presents with   Failure To Thrive     Brief Narrative / Interim history: 71 year old M with PMH of EtOH cirrhosis, nonimmune hemolytic anemia, pancytopenia, right AKA and hepatitis C brought to ED with altered mental status after he slid off his bed and found down on the ground by family, and admitted to ICU with hypovolemic shock, hemolytic anemia, liver failure, encephalopathy, AKI and hyperkalemia.  In ED, hypotensive and altered.  Hgb 4.0.  Lactic acid> 9.  AST 495.  ALT 107.  Bili 8.  He was started on Levophed, IV antibiotics and blood transfusion. Hematology consulted. Patient came off Levophed and transferred to [facility] hospitalist service on 5/24.    RUQ [place] concerning for liver cirrhosis and liver mass. 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  Liver cirrhosis, portal HTN, splenomegaly, ascites. General surgery consulted and recommended conservative care with IV antibiotics.   6/26-patient was on IV fluid for AKI and rhabdomyolysis but developed significant scrotal edema.  AKI resolved.  IV fluid discontinued.  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  He also had bloody stool later in the day.  Patient was moved to ICU.  PCCM, general surgery and IR consulted.  Antibiotics broadened.  6/27-transfused a unit of FFP and 2 units of PRBC.  Had  percutaneous cholecystostomy tube.  Orthopedic surgery and GI consulted, and did not feel surgical intervention is indicated.  6/28-Transferred to progressive care.  Started on p.o. Seroquel for delirium.  De-escalated antibiotics to IV Unasyn based on biliary culture.  No further GI bleed but blood counts dropped likely due to hemolytic anemia.  Transfusing.     Subjective: Seen and examined earlier this morning.  No major events overnight of this morning.  Patient's son reported that patient had a good night.  He ate his breakfast and went back to sleep this morning.  Concerned about hemoglobin dropping.   Objective: Vitals:   12/26/21 0635 12/26/21 0941 12/26/21 0956 12/26/21 1203  BP: (!) 152/68 138/64 139/66 (!) 142/77  Pulse: 99 95 93 84  Resp: 19 (!) 27 (!) 26 (!) 25  Temp: 99 F (37.2 C) 97.9 F (36.6 C) 97.8 F (36.6 C) 98.4 F (36.9 C)  TempSrc: Oral Oral Oral Oral  SpO2: 99% 96% 95% 99%  Weight:      Height:        Examination:   GENERAL: No apparent distress.  Nontoxic. HEENT: MMM.  Vision and hearing grossly intact.  NECK: Supple.  No apparent JVD.  RESP: On RA.  No IWOB.  Fair aeration bilaterally. CVS:  RRR. Heart sounds normal.  ABD/GI/GU: BS+. Abd soft, NTND.  RUQ drain with bile.  Significant scrotal swelling.  No tenderness. MSK/EXT: Right AKA.  Some degree of LLE contracture at the knee.  Faint DP pulse.  1+ edema. SKIN: LLE skin change consistent with venous insufficiency. NEURO: Sleepy but wakes to voice.  No apparent  focal neuro deficit. PSYCH: Calm. Normal affect.   Procedures:  5/27-percutaneous biliary drain placement  Microbiology summarized: 5/22-COVID-19 and influenza PCR nonreactive. 5/24-MRSA PCR screen positive. 5/22-blood cultures NGTD 5/22-urine cultures NGTD 5/27-blood culture NGTD. 5/27-biliary culture with Enterococcus faecalis and faecium and lactobacillus species   Assessment and Plan: * Septic shock (HCC) Tachypneic,  hypotensive with AKI, lactic acidosis and thrombocytopenia, and required vasopressors on admission.  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  Blood and urine cultures NGTD.  Biliary culture with pansensitive Enterococcus faecalis and faecium as well as lactobacillus species.  Hemodynamically stable.  No leukocytosis -High risk for surgical intervention -Percutaneous cholecystostomy tube by IR on 5/27 -CTX and Flagyl >>>Vanc, Cefepime and Flagyl 5/27>>> IV Unasyn 5/30>> -General surgery sent referral to [facility] for outpatient follow-up -Discharge plan in place from IR standpoint -Tolerating soft diet.   Acute pain of left thigh/rhabdomyolysis/myonecrosis CT abdomen and pelvis shows muscular swelling with tracking gas within the proximal muscle compartments likely myonecrosis from traumatic rhabdomyolysis versus neck fasciitis.  CK improving. -Orthopedic surgery signed off. -Continue monitoring  SVT (supraventricular tachycardia) ([place]) One episode on 5/26.  As symptomatic.  Normotensive.  Resolved with IV metoprolol. -IV metoprolol as needed -Continue telemetry monitoring  Acute metabolic encephalopathy Initially, patient was in septic shock with uremia and some degree of hepatic encephalopathy.  Now he seems to be having delirium.  He is at risk for polypharmacy as well.  Delirium improved after low-dose Seroquel at night. -Seroquel 25 mg HS and IV Haldol 2 mg q6h PRN-risk and benefit discussed with sons.  QTc 432. -Treat treatable causes -Minimize sedating medications -Reorientation and delirium precautions.  AKI (acute kidney injury) ([place]) Recent Labs    12/18/21 1124 12/18/21 1751 12/19/21 1030 12/20/21 0223 12/21/21 0244 12/22/21 0413 12/22/21 2345 12/24/21 0309 12/25/21 0317 12/26/21 0310  BUN 38* 42* 56* 63* 42* '22 16 15 17 16  '$ CREATININE 1.70* 1.44* 1.70* 1.89* 1.11 0.61 0.55*  0.65 0.56* 0.41*  Likely due to hypotension and rhabdomyolysis.  Resolved. -Recheck renal function in the morning -Avoid nephrotoxic meds   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 Elevated liver enzymes/hyperbilirubinemia Recent Labs  Lab 12/22/21 0413 12/22/21 2345 12/24/21 0309 12/25/21 0317 12/26/21 0310  AST 426* 377* 251* 193* 111*  ALT 239* 205* 153* 125* 89*  ALKPHOS 74 73 63 54 47  BILITOT 1.4* 1.4* 1.4* 1.6* 1.3*  PROT 4.9* 5.0* 5.0* 5.0* 4.7*  ALBUMIN 1.9* 2.0* 2.0* 1.9* 1.8*  LFT pattern consistent rhabdo.  Rhabdo improving.  He has mild coagulopathy as well. 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 LDH markedly elevated.  Haptoglobin low.  Had a splenomegaly and splenic infarction.  Had frank red blood in his stool on 5/26 but no further bleed after FFP and platelet transfusion. EGD 5/22 with gastritis and duodenitis.  Colo in 5/22 with polyps, diverticulosis and internal hemorrhoid.  -Transfused a total of 6 units PRBC, 2 apheresis of platelet and 1 FFP so far. -Hgb 6.6.  Confirm.  Transfuse 1 unit -[facility] signed off. -Monitor H&H and transfuse for Hgb <7.0.   Splenic infarction Wedge-shaped area  of hypoenhancement involving the anterior spleen concerning for splenic infarction noted on CT abdomen and pelvis. -Per general surgery.  Left leg weakness and numbness Likely from rhabdomyolysis/myonecrosis of left thigh. ABI with severe PAD but not sure if this is reliable in the setting of rhabdomyolysis/myonecrosis. -He may need repeat ABI once he recovers -Follow-up with vascular surgery outpatient -Closely  monitor  Hyperkalemia, hyponatremia and hypomagnesemia Hyperkalemia resolved.  Hyponatremia mild and stable.  Mg 1.6. -IV magnesium sulfate 4 g x 1.  Thrombocytopenia ([place]) Recent Labs  Lab 12/20/21 0223 12/21/21 0244 12/21/21 1253 12/22/21 0825 12/22/21 2345 12/24/21 0309 12/25/21 0317 12/26/21 0310 12/26/21 0704  PLT 31* 25* 21* 24* 25* 25* 25* 27* 22*  Had 2 apheresis of platelet on 5/27. Transfuse for platelet < 10k or bleeding Continue monitoring   Goals of care, counseling/discussion Remains full code with full scope of care.  See discussion on 5/26 and 5/27 -Palliative medicine following  Abnormal MRI, liver-resolved as of 12/23/2021 RUQ [place] concerning for 6 cm liver mass.  Not appreciated on CT abdomen with contrast.  Physical deconditioning Wheelchair-bound at baseline.  He says he is able to transfer using left leg at baseline. -PT/OT recommended SNF -Left leg evaluation as above  Scrotal edema Likely due to IV fluid in the setting of liver cirrhosis. IV fluid discontinued.  Improving. -Continue monitoring  Elevated troponin Likely demand ischemia in the setting of sepsis and shock  Calculus of bile duct with acute cholecystitis See shock  Chronic pain syndrome Radius oxycodone to 20 mg every 6 hours.  Per database, feels 120 for 30 days -Discontinue IV Dilaudid  Anxiety Patient is adamant about his Xanax despite risk for polypharmacy and encephalopathy. -Resumed.  Pressure skin injury: POA Pressure Injury 12/18/21 Foot Anterior;Left;Lateral Deep Tissue Pressure Injury - Purple or maroon localized area of discolored intact skin or blood-filled blister due to damage of underlying soft tissue from pressure and/or shear. purple, red (Active)  12/18/21 1530  Location: Foot  Location Orientation: Anterior;Left;Lateral  Staging: Deep Tissue Pressure Injury - Purple or maroon localized area of discolored intact skin or blood-filled blister due to damage of  underlying soft tissue from pressure and/or shear.  Wound Description (Comments): purple, red  Present on Admission: Yes  Dressing Type Foam - Lift dressing to assess site every shift 12/26/21 0805   DVT prophylaxis:  SCDs Start: 12/18/21 1421  Code Status: Full code Family Communication: Updated patient's son at bedside. Level of care: Progressive Status is: Inpatient Remains inpatient appropriate because: Autoimmune hemolytic anemia requiring blood transfusions   Final disposition: SNF once hemoglobin is stable Consultants:  Pulmonology-signed off Oncology/hematology General surgery Orthopedic surgery Gastroenterology-signed off Interventional radiology Palliative medicine  Sch Meds:  Scheduled Meds:  (feeding supplement) PROSource Plus  30 mL Oral Daily   Chlorhexidine Gluconate Cloth  6 each Topical Q0600   feeding supplement  1 Container Oral BID BM   feeding supplement  237 mL Oral Q24H   levothyroxine  50 mcg Oral Q0600   mouth rinse  15 mL Mouth Rinse BID   multivitamin with minerals  1 tablet Oral Daily   pantoprazole (PROTONIX) IV  40 mg Intravenous Q12H   QUEtiapine  25 mg Oral QHS   sodium chloride flush  5 mL Intracatheter Q8H   Continuous Infusions:  sodium chloride Stopped (12/23/21 0547)   ampicillin-sulbactam (UNASYN) IV Stopped (12/26/21 0841)   PRN Meds:.sodium chloride, acetaminophen, ALPRAZolam, docusate, haloperidol lactate, metoprolol tartrate, oxyCODONE, polyethylene glycol  Antimicrobials: Anti-infectives (  From admission, onward)    Start     Dose/Rate Route Frequency Ordered Stop   12/26/21 0800  Ampicillin-Sulbactam (UNASYN) 3 g in sodium chloride 0.9 % 100 mL IVPB        3 g 200 mL/hr over 30 Minutes Intravenous Every 6 hours 12/26/21 0711     12/23/21 1100  vancomycin (VANCOREADY) IVPB 1250 mg/250 mL  Status:  Discontinued        1,250 mg 166.7 mL/hr over 90 Minutes Intravenous Every 12 hours 12/23/21 0922 12/26/21 0703   12/23/21  1000  ceFEPIme (MAXIPIME) 2 g in sodium chloride 0.9 % 100 mL IVPB  Status:  Discontinued        2 g 200 mL/hr over 30 Minutes Intravenous Every 8 hours 12/23/21 0922 12/26/21 0703   12/22/21 2048  metroNIDAZOLE (FLAGYL) IVPB 500 mg  Status:  Discontinued        500 mg 100 mL/hr over 60 Minutes Intravenous Every 12 hours 12/22/21 2049 12/26/21 0703   12/21/21 1100  cefTRIAXone (ROCEPHIN) 2 g in sodium chloride 0.9 % 100 mL IVPB  Status:  Discontinued        2 g 200 mL/hr over 30 Minutes Intravenous Every 24 hours 12/21/21 0900 12/23/21 0904   12/20/21 1830  metroNIDAZOLE (FLAGYL) IVPB 500 mg  Status:  Discontinued        500 mg 100 mL/hr over 60 Minutes Intravenous Every 12 hours 12/20/21 1727 12/22/21 2049   12/20/21 1100  cefTRIAXone (ROCEPHIN) 1 g in sodium chloride 0.9 % 100 mL IVPB  Status:  Discontinued        1 g 200 mL/hr over 30 Minutes Intravenous Every 24 hours 12/20/21 1006 12/21/21 0900   12/19/21 2000  vancomycin (VANCOREADY) IVPB 1250 mg/250 mL  Status:  Discontinued        1,250 mg 166.7 mL/hr over 90 Minutes Intravenous Every 24 hours 12/18/21 1946 12/19/21 1207   12/19/21 1100  ceFEPIme (MAXIPIME) 2 g in sodium chloride 0.9 % 100 mL IVPB  Status:  Discontinued        2 g 200 mL/hr over 30 Minutes Intravenous Every 12 hours 12/19/21 1038 12/20/21 1006   12/19/21 0600  ceFEPIme (MAXIPIME) 2 g in sodium chloride 0.9 % 100 mL IVPB  Status:  Discontinued        2 g 200 mL/hr over 30 Minutes Intravenous Every 12 hours 12/18/21 1943 12/19/21 1038   12/18/21 1845  vancomycin (VANCOREADY) IVPB 2000 mg/400 mL        2,000 mg 200 mL/hr over 120 Minutes Intravenous STAT 12/18/21 1828 12/18/21 2151   12/18/21 1830  ceFEPIme (MAXIPIME) 2 g in sodium chloride 0.9 % 100 mL IVPB        2 g 200 mL/hr over 30 Minutes Intravenous STAT 12/18/21 1818 12/18/21 1951        I have personally reviewed the following labs and images: CBC: Recent Labs  Lab 12/22/21 2345 12/23/21 1448  12/24/21 0309 12/25/21 0317 12/26/21 0310 12/26/21 0704  WBC 4.6  --  3.9* 4.0 2.8* 3.0*  HGB 7.1* 7.5* 7.6* 7.5* 6.6* 6.7*  HCT 21.1* 21.8* 22.9* 22.4* 19.8* 20.2*  MCV 95.5  --  96.2 95.7 97.5 95.7  PLT 25*  --  25* 25* 27* 22*   BMP &GFR Recent Labs  Lab 12/22/21 0413 12/22/21 2345 12/24/21 0309 12/25/21 0317 12/26/21 0310  NA 133* 134* 133* 134* 134*  K 3.8 3.8 3.5 3.7 3.6  CL 103  104 103 104 104  CO2 '24 25 26 24 24  '$ GLUCOSE 164* 147* 149* 138* 118*  BUN '22 16 15 17 16  '$ CREATININE 0.61 0.55* 0.65 0.56* 0.41*  CALCIUM 7.6* 7.6* 7.6* 7.8* 7.5*  MG 1.6* 1.6* 1.5* 1.4* 1.6*  PHOS 2.6 2.8 3.5 3.2 3.4   Estimated Creatinine Clearance: 103.4 mL/min (A) (by C-G formula based on SCr of 0.41 mg/dL (L)). Liver & Pancreas: Recent Labs  Lab 12/22/21 0413 12/22/21 2345 12/24/21 0309 12/25/21 0317 12/26/21 0310  AST 426* 377* 251* 193* 111*  ALT 239* 205* 153* 125* 89*  ALKPHOS 74 73 63 54 47  BILITOT 1.4* 1.4* 1.4* 1.6* 1.3*  PROT 4.9* 5.0* 5.0* 5.0* 4.7*  ALBUMIN 1.9* 2.0* 2.0* 1.9* 1.8*   No results for input(s): LIPASE, AMYLASE in the last 168 hours.  Recent Labs  Lab 12/22/21 0405 12/22/21 2345 12/24/21 0309 12/25/21 0317 12/26/21 0704  AMMONIA 20 36* 21 33 32   Diabetic: No results for input(s): HGBA1C in the last 72 hours.  No results for input(s): GLUCAP in the last 168 hours.  Cardiac Enzymes: Recent Labs  Lab 12/22/21 0413 12/22/21 2345 12/24/21 0309 12/25/21 0317 12/26/21 0310  CKTOTAL 44,010* 2,725* 5,583* 4,082* 2,204*   No results for input(s): PROBNP in the last 8760 hours. Coagulation Profile: Recent Labs  Lab 12/22/21 0413 12/22/21 2345 12/24/21 0309 12/25/21 0317 12/26/21 0310  INR 1.4* 1.3* 1.3* 1.4* 1.4*   Thyroid Function Tests: No results for input(s): TSH, T4TOTAL, FREET4, T3FREE, THYROIDAB in the last 72 hours. Lipid Profile: No results for input(s): CHOL, HDL, LDLCALC, TRIG, CHOLHDL, LDLDIRECT in the last 72  hours. Anemia Panel: No results for input(s): VITAMINB12, FOLATE, FERRITIN, TIBC, IRON, RETICCTPCT in the last 72 hours.  Urine analysis:    Component Value Date/Time   COLORURINE AMBER (A) 12/18/2021 1139   APPEARANCEUR CLEAR 12/18/2021 1139   LABSPEC 1.013 12/18/2021 1139   PHURINE 5.0 12/18/2021 1139   GLUCOSEU NEGATIVE 12/18/2021 1139   GLUCOSEU NEGATIVE 01/06/2015 0912   HGBUR LARGE (A) 12/18/2021 1139   BILIRUBINUR NEGATIVE 12/18/2021 1139   KETONESUR 5 (A) 12/18/2021 1139   PROTEINUR 100 (A) 12/18/2021 1139   UROBILINOGEN 1.0 01/06/2015 0912   NITRITE NEGATIVE 12/18/2021 1139   LEUKOCYTESUR NEGATIVE 12/18/2021 1139   Sepsis Labs: Invalid input(s): PROCALCITONIN, [place]  Microbiology: Recent Results (from the past 240 hour(s))  Blood Culture (routine x 2)     Status: None   Collection Time: 12/18/21 11:24 AM   Specimen: BLOOD  Result Value Ref Range Status   Specimen Description   Final    BLOOD BLOOD RIGHT HAND Performed at [facility] [address] [place], [place] [address]    Special Requests   Final    BOTTLES DRAWN AEROBIC AND ANAEROBIC Blood Culture adequate volume Performed at [facility] [address] [place], [place] [address]    Culture   Final    NO GROWTH 5 DAYS Performed at [facility], [place] [address], [place], [place] [address]    Report Status 12/23/2021 FINAL  Final  Blood Culture (routine x 2)     Status: None   Collection Time: 12/18/21 11:32 AM   Specimen: BLOOD  Result Value Ref Range Status   Specimen Description   Final    BLOOD RIGHT ANTECUBITAL Performed at [facility] [address], [place], [place] [address]    Special Requests   Final  BOTTLES DRAWN AEROBIC AND ANAEROBIC Blood Culture results may not be optimal due to an excessive volume of blood received in culture bottles Performed at [facility], [place] [address],  [place], [place] [address]    Culture   Final    NO GROWTH 5 DAYS Performed at [facility], [place] [address] [place], [place] [address]    Report Status 12/23/2021 FINAL  Final  Urine Culture     Status: None   Collection Time: 12/18/21 11:39 AM   Specimen: In/Out Cath Urine  Result Value Ref Range Status   Specimen Description   Final    IN/OUT CATH URINE Performed at [facility] [address] [place], [place] [address]    Special Requests   Final    NONE Performed at [facility], [place] [address] [place], [place] [address]    Culture   Final    NO GROWTH Performed at [facility], [place] [address], [place], [place] [address]    Report Status 12/20/2021 FINAL  Final  Resp Panel by RT-PCR (Flu A&B, Covid) Nasopharyngeal Swab     Status: None   Collection Time: 12/18/21 12:22 PM   Specimen: Nasopharyngeal Swab; Nasopharyngeal(NP) swabs in vial transport medium  Result Value Ref Range Status   SARS Coronavirus 2 by RT PCR NEGATIVE NEGATIVE Final    Comment: (NOTE) SARS-CoV-2 target nucleic acids are NOT DETECTED.  The SARS-CoV-2 RNA is generally detectable in upper respiratory specimens during the acute phase of infection. The lowest concentration of SARS-CoV-2 viral copies this assay can detect is 138 copies/mL. A negative result does not preclude SARS-Cov-2 infection and should not be used as the sole basis for treatment or other patient management decisions. A negative result may occur with  improper specimen collection/handling, submission of specimen other than nasopharyngeal swab, presence of viral mutation(s) within the areas targeted by this assay, and inadequate number of viral copies(<138 copies/mL). A negative result must be combined with clinical observations, patient history, and epidemiological information. The expected result is Negative.  Fact Sheet for Patients:   EntrepreneurPulse.com.au  Fact Sheet for Healthcare Providers:  IncredibleEmployment.be  This test is no t yet approved or cleared by the [place] FDA and  has been authorized for detection and/or diagnosis of SARS-CoV-2 by FDA under an Emergency Use Authorization (EUA). This EUA will remain  in effect (meaning this test can be used) for the duration of the COVID-19 declaration under Section 564(b)(1) of the Act, 21 U.S.C.section 360bbb-3(b)(1), unless the authorization is terminated  or revoked sooner.       Influenza A by PCR NEGATIVE NEGATIVE Final   Influenza B by PCR NEGATIVE NEGATIVE Final    Comment: (NOTE) The Xpert Xpress SARS-CoV-2/FLU/RSV plus assay is intended as an aid in the diagnosis of influenza from Nasopharyngeal swab specimens and should not be used as a sole basis for treatment. Nasal washings and aspirates are unacceptable for Xpert Xpress SARS-CoV-2/FLU/RSV testing.  Fact Sheet for Patients: EntrepreneurPulse.com.au  Fact Sheet for Healthcare Providers: IncredibleEmployment.be  This test is not yet approved or cleared by the [place] FDA and has been authorized for detection and/or diagnosis of SARS-CoV-2 by FDA under an Emergency Use Authorization (EUA). This EUA will remain in effect (meaning this test can be used) for the duration of the COVID-19 declaration under Section 564(b)(1) of the Act, 21 U.S.C. section 360bbb-3(b)(1), unless the authorization is terminated or revoked.  Performed at [facility]  [place] [address], [place], [place] [address]   MRSA Next Gen by PCR, Nasal     Status: Abnormal   Collection Time: 12/20/21  9:40 AM   Specimen: Nasal Mucosa; Nasal Swab  Result Value Ref Range Status   MRSA by PCR Next Gen DETECTED (A) NOT DETECTED Final    Comment: (NOTE) The GeneXpert MRSA Assay (FDA approved for NASAL specimens only), is one  component of a comprehensive MRSA colonization surveillance program. It is not intended to diagnose MRSA infection nor to guide or monitor treatment for MRSA infections. Test performance is not FDA approved in patients less than 16 years old. Performed at [facility], [place] [address], [place], [place] [address]   Aerobic/Anaerobic Culture w Gram Stain (surgical/deep wound)     Status: None (Preliminary result)   Collection Time: 12/23/21 11:58 AM   Specimen: BILE  Result Value Ref Range Status   Specimen Description BILE  Final   Special Requests NONE  Final   Gram Stain   Final    ABUNDANT WBC PRESENT, PREDOMINANTLY MONONUCLEAR MODERATE GRAM POSITIVE COCCI IN PAIRS    Culture   Final    MODERATE ENTEROCOCCUS FAECALIS MODERATE ENTEROCOCCUS FAECIUM ABUNDANT LACTOBACILLUS SPECIES Standardized susceptibility testing for this organism is not available.    Report Status PENDING  Incomplete   Organism ID, Bacteria ENTEROCOCCUS FAECALIS  Final   Organism ID, Bacteria ENTEROCOCCUS FAECIUM  Final      Susceptibility   Enterococcus faecalis - MIC*    AMPICILLIN <=2 SENSITIVE Sensitive     VANCOMYCIN 2 SENSITIVE Sensitive     GENTAMICIN SYNERGY Value in next row Sensitive      SENSITIVEPerformed at [facility] [address], [place], [place] [address]    * MODERATE ENTEROCOCCUS FAECALIS   Enterococcus faecium - MIC*    AMPICILLIN Value in next row Sensitive      SENSITIVEPerformed at [facility] [address], [place], [place] [address]    VANCOMYCIN Value in next row Sensitive      SENSITIVEPerformed at [facility] [address], [place], [place] [address]    GENTAMICIN SYNERGY Value in next row Sensitive      SENSITIVEPerformed at [facility], [place] [address] [place], [place] [address]    * MODERATE ENTEROCOCCUS FAECIUM  Culture, blood (Routine X 2) w Reflex to ID Panel     Status: None (Preliminary result)   Collection Time:  12/23/21  2:48 PM   Specimen: BLOOD  Result Value Ref Range Status   Specimen Description   Final    BLOOD LEFT ANTECUBITAL Performed at [facility], [place] [address], [place], [place] [address]    Special Requests   Final    NONE Performed at [facility], [place] [address] [place], [place] [address]    Culture   Final    NO GROWTH 3 DAYS Performed at [facility], [place] [address] [place], [place] [address]    Report Status PENDING  Incomplete  Culture, blood (Routine X 2) w Reflex to ID Panel     Status: None (Preliminary result)   Collection Time: 12/23/21  2:48 PM   Specimen: BLOOD  Result Value Ref Range Status   Specimen Description   Final    BLOOD RIGHT ANTECUBITAL Performed at [facility], [place] [address], [place], [place] [address]    Special Requests  Final    NONE Performed at [facility], [place] [address] [place], [place] [address]    Culture   Final    NO GROWTH 3 DAYS Performed at [facility], [place] [address], [place], [place] [address]    Report Status PENDING  Incomplete    Radiology Studies: No results found.    Tobe Kervin T. [place]  If 7PM-7AM, please contact night-coverage [email or web address] 12/26/2021, 12:20 PM

## 2021-12-26 NOTE — Plan of Care (Signed)

## 2021-12-26 NOTE — Progress Notes (Signed)
Supervising Physician: Aletta Edouard  Patient Status:  [facility] - In-pt  Chief Complaint:  Cholecystitis s/p perc chole drain  Subjective:  Sleeping, in NAD.  RN reports no concerns  Allergies: Clindamycin  Medications: Prior to Admission medications   Medication Sig Start Date End Date Taking? Authorizing Provider  ALPRAZolam Duanne Moron) 1 MG tablet Take 1 mg by mouth 3 (three) times daily.   Yes [provider]  Cholecalciferol (VITAMIN D3) 10 MCG (400 UNIT) CAPS Take 4 capsules by mouth daily.   Yes [provider]  folic acid (FOLVITE) 1 MG tablet TAKE 1 TABLET(1 MG) BY MOUTH DAILY Patient taking differently: Take 1 mg by mouth daily. 10/12/21  Yes Burns, Wandra Feinstein, NP  hydrochlorothiazide (HYDRODIURIL) 12.5 MG tablet TAKE 1 TABLET(12.5 MG) BY MOUTH DAILY Patient taking differently: Take 12.5 mg by mouth daily. 10/27/21  Yes [name], FNP  levothyroxine (SYNTHROID) 50 MCG tablet TAKE 1 TABLET(50 MCG) BY MOUTH DAILY BEFORE BREAKFAST Patient taking differently: Take 50 mcg by mouth daily before breakfast. 12/14/21  Yes Arnett, [name], FNP  losartan (COZAAR) 100 MG tablet TAKE 1 TABLET(100 MG) BY MOUTH DAILY Patient taking differently: Take 100 mg by mouth daily. 11/14/21  Yes Arnett, [name], FNP  lubiprostone (AMITIZA) 24 MCG capsule Take 24 mcg by mouth daily as needed. 08/13/14  Yes [provider]  meloxicam (MOBIC) 15 MG tablet Take 15 mg by mouth every morning. 01/12/21  Yes [provider]  morphine (MS CONTIN) 15 MG 12 hr tablet Take 15 mg by mouth 2 (two) times daily as needed for pain. 04/06/20  Yes [provider]  Oxycodone HCl 20 MG TABS Take 20 mg by mouth 4 (four) times daily. 12/04/21  Yes [provider]  pantoprazole (PROTONIX) 40 MG tablet Take 1 tablet (40 mg total) by mouth daily. 12/18/20 12/18/21 Yes Sharen Hones, MD  vitamin B-12 (CYANOCOBALAMIN) 1000 MCG tablet Take 1 tablet (1,000 mcg total) by  mouth daily. 04/09/18  Yes Bettey Costa, MD  NARCAN 4 MG/0.1ML LIQD nasal spray kit Place 1 spray into the nose once. 12/29/18   [provider]     Vital Signs: BP 139/66   Pulse 93   Temp 97.8 F (36.6 C) (Oral)   Resp (!) 26   Ht 6' (1.829 m)   Wt 225 lb 15.5 oz (102.5 kg)   SpO2 95%   BMI 30.65 kg/m   Physical Exam Constitutional:      General: He is sleeping. He is not in acute distress. HENT:     Head: Normocephalic and atraumatic.  Cardiovascular:     Rate and Rhythm: Normal rate.  Pulmonary:     Effort: Pulmonary effort is normal.  Skin:    General: Skin is dry.   Drain Location: RUQ Size: Fr size: 10 Fr Date of placement: 12/23/21  Currently to: Drain collection device: gravity 24 hour output:  Output by Drain (mL) 12/24/21 0701 - 12/24/21 1900 12/24/21 1901 - 12/25/21 0700 12/25/21 0701 - 12/25/21 1900 12/25/21 1901 - 12/26/21 0700 12/26/21 0701 - 12/26/21 1035  Biliary Tube Other (Comment) 10 Fr. RUQ 100 60 75 50     Interval imaging/drain manipulation:  None  Current examination: Insertion site unremarkable. Suture in place. Dressed appropriately.  Typical appearing biliary OP in drainage bag  Imaging: CT ABDOMEN PELVIS W CONTRAST  Result Date: 12/22/2021 CLINICAL DATA:  Septic shock.  Liver lesion. EXAM: CT ABDOMEN AND PELVIS WITH CONTRAST TECHNIQUE: Multidetector  CT imaging of the abdomen and pelvis was performed using the standard protocol following bolus administration of intravenous contrast. RADIATION DOSE REDUCTION: This exam was performed according to the departmental dose-optimization program which includes automated exposure control, adjustment of the mA and/or kV according to patient size and/or use of iterative reconstruction technique. CONTRAST:  154m OMNIPAQUE IOHEXOL 300 MG/ML  SOLN COMPARISON:  CT AP from 12/20/2021. FINDINGS: Lower chest: Mild dependent changes identified within the lung bases. Hepatobiliary: Suspected cirrhosis.  Hypertrophy of the lateral segment of left hepatic lobe and caudate lobe of liver noted. No focal liver lesion is identified. However, multiphase CT or MRI is the study of choice for evaluating hepatocellular carcinoma. Normal portal venous phase CT does not exclude the possibility of underlying HCC. Multiple gallstones are again noted within the gallbladder. Gas is now seen within the lumen of the gallbladder. There is gallbladder wall thickening with surrounding inflammatory fat stranding. Secondary inflammation with wall thickening of the hepatic flexure is noted. There is enteric contrast material within the inflamed segment of the colon. No signs of patent contrast opacified fistula with the gallbladder. No signs of bile duct dilatation. Pancreas: Unremarkable. No pancreatic ductal dilatation or surrounding inflammatory changes. Spleen: There is a wedge-shaped area of hypoenhancement involving the anterior spleen compatible with splenic infarct, image 33/2. Adrenals/Urinary Tract: Normal adrenal glands. No kidney mass or hydronephrosis identified bilaterally. Urinary bladder is unremarkable. Stomach/Bowel: Stomach appears normal. Increase caliber of the duodenum within the left hemiabdomen is noted favoring focal ileus. Focal wall thickening involving the hepatic flexure is identified where it abuts the inflamed gallbladder. Vascular/Lymphatic: Aortic atherosclerosis without aneurysm. No abdominopelvic adenopathy. Reproductive: Prostate is unremarkable. Other: Small volume of free fluid noted within the pelvis. No discrete fluid collection to suggest abscess. Musculoskeletal: Limited imaging through the lower extremity show marked asymmetric swelling of the medial musculature of the left thigh with gas tracking throughout the muscle and surrounding overlying fat stranding. Previous ORIF of the right femur. Unchanged superior endplate compression fracture involving the L2 vertebra. IMPRESSION: 1. Imaging  findings compatible with acute cholecystitis. Gas is now identified within the lumen of the gallbladder. Diagnosis of exclusion would be acute emphysematous cholecystitis. Alternatively, presence of gas within the gallbladder lumen may reflect cholecystocolonic fistula although there is no patent contrast opacifying fistula identified on the current exam (with sufficient contrast material noted within the adjacent inflamed loop of colon). 2. Acute splenic infarct. 3. Marked asymmetric swelling of the medial musculature of the left thigh with gas tracking throughout the muscle with surrounding fat stranding. Cannot exclude necrotizing fasciitis. 4. Increase caliber of the duodenum within the left hemiabdomen favoring focal ileus. 5. Cirrhosis. No mass identified within the liver to suggest underlying malignancy. Certainly there is no lesion within the liver which should impact the patient's current acute clinical situation. Ideally, in a patient that is at increased risk for hepatocellular carcinoma the study of choice for screening [place] be an outpatient, multiphase MRI. This should be performed only when the patient is able to remain motionless and participate in breath hold technique. Alternatively, outpatient multiphase liver CT would be recommended. 6. Aortic Atherosclerosis (ICD10-I70.0). Critical Value/emergent results were called by telephone at the time of interpretation on 12/22/2021 at 7:36 pm to provider ERufina Falco NP , who verbally acknowledged these results. Electronically Signed   By: TKerby MoorsM.D.   On: 12/22/2021 19:37   IR Perc Cholecystostomy  Result Date: 12/23/2021 INDICATION: 71year old gentleman with acute cholecystitis and sepsis  presents to IR for cholecystostomy drain placement EXAM: Ultrasound and fluoroscopy guided cholecystostomy drain placement MEDICATIONS: Patient on inpatient course of antibiotics. ANESTHESIA/SEDATION: Moderate (conscious) sedation was employed  during this procedure. A total of Versed 2 mg and Fentanyl 100 mcg was administered intravenously. Moderate Sedation Time: 12 minutes. The patient's level of consciousness and vital signs were monitored continuously by radiology nursing throughout the procedure under my direct supervision. FLUOROSCOPY TIME:  Radiation Exposure Index (as provided by the fluoroscopic device): 22 mGy Kerma COMPLICATIONS: None immediate. PROCEDURE: Informed written consent was obtained from the patient after a thorough discussion of the procedural risks, benefits and alternatives. All questions were addressed. Maximal Sterile Barrier Technique was utilized including caps, mask, sterile gowns, sterile gloves, sterile drape, hand hygiene and skin antiseptic. A timeout was performed prior to the initiation of the procedure. Patient positioned supine on the procedure table. Right upper quadrant skin prepped and draped usual fashion. Following local lidocaine administration, the gallbladder was accessed at the level of the fundus with a 21 gauge needle utilizing continuous ultrasound guidance. 21 gauge needle exchanged for a transitional dilator set over 0.018 inch guidewire. Transitional dilator set removed over 0.035 inch guidewire. Tract dilation performed and a 10.2 [place] multipurpose pigtail drain was placed. 40 mL appearing material was aspirated. Samples were sent for Gram stain and culture. Contrast administered through the drain under fluoroscopy confirmed appropriate positioning of the pigtail within the gallbladder lumen. Multiple filling defects were consistent with gallstones. Drain secured to skin with suture and connected to bag. IMPRESSION: 10.2 [place] multipurpose pigtail drain placed in gallbladder. Electronically Signed   By: Miachel Roux M.D.   On: 12/23/2021 13:53   DG CHEST PORT 1 VIEW  Result Date: 12/23/2021 CLINICAL DATA:  Aspiration precautions. EXAM: PORTABLE CHEST 1 VIEW COMPARISON:  Chest radiograph Dec 18, 2021. FINDINGS: Low lung volumes. No consolidation. No visible pleural effusions or pneumothorax. Similar cardiomediastinal silhouette. IMPRESSION: Low lung volumes without evidence of acute cardiopulmonary disease. Electronically Signed   By: Margaretha Sheffield M.D.   On: 12/23/2021 09:56   DG FEMUR PORT MIN 2 VIEWS LEFT  Result Date: 12/23/2021 CLINICAL DATA:  Leg pain.  Fall. EXAM: LEFT FEMUR PORTABLE 2 VIEWS COMPARISON:  CT abdomen/pelvis Dec 22, 2021. FINDINGS: No evidence of acute fracture. Vascular calcifications. Gas and swelling in the left thigh better characterized on recent CT abdomen/pelvis. IMPRESSION: 1. No evidence of acute fracture. 2. Gas and swelling in the left thigh better characterized on recent CT abdomen/pelvis. Electronically Signed   By: Margaretha Sheffield M.D.   On: 12/23/2021 13:40    Labs:  CBC: Recent Labs    12/24/21 0309 12/25/21 0317 12/26/21 0310 12/26/21 0704  WBC 3.9* 4.0 2.8* 3.0*  HGB 7.6* 7.5* 6.6* 6.7*  HCT 22.9* 22.4* 19.8* 20.2*  PLT 25* 25* 27* 22*    COAGS: Recent Labs    12/18/21 1124 12/21/21 0244 12/22/21 2345 12/24/21 0309 12/25/21 0317 12/26/21 0310  INR 1.8*   < > 1.3* 1.3* 1.4* 1.4*  APTT 32  --   --   --   --   --    < > = values in this interval not displayed.    BMP: Recent Labs    12/22/21 2345 12/24/21 0309 12/25/21 0317 12/26/21 0310  NA 134* 133* 134* 134*  K 3.8 3.5 3.7 3.6  CL 104 103 104 104  CO2 25 26 24 24   GLUCOSE 147* 149* 138* 118*  BUN 16 15 17  16  CALCIUM 7.6* 7.6* 7.8* 7.5*  CREATININE 0.55* 0.65 0.56* 0.41*  GFRNONAA >60 >60 >60 >60    LIVER FUNCTION TESTS: Recent Labs    12/22/21 2345 12/24/21 0309 12/25/21 0317 12/26/21 0310  BILITOT 1.4* 1.4* 1.6* 1.3*  AST 377* 251* 193* 111*  ALT 205* 153* 125* 89*  ALKPHOS 73 63 54 47  PROT 5.0* 5.0* 5.0* 4.7*  ALBUMIN 2.0* 2.0* 1.9* 1.8*    Assessment and Plan:  Cholecystitis s/p perc chole catheter --125cc OP yesterday --Continue TID  flushes with 5 cc NS. --Record output Q shift. --Dressing changes QD or PRN if soiled.  --Call IR APP or on call IR MD if difficulty flushing or sudden change in drain output.  --Repeat imaging/possible drain injection once output < 10 mL/QD (excluding flush material.)  Discharge planning: Please contact IR APP or on call IR MD prior to patient d/c to ensure appropriate follow up plans are in place. Typically patient will follow up with IR clinic 6-8 weeks s/p placement for repeat imaging/possible drain injection. IR scheduler will contact patient with date/time of appointment. Patient will need to flush drain QD with 5 cc NS, record output QD, dressing changes every 2-3 days or earlier if soiled.   IR will continue to follow - please call with questions or concerns.  Electronically Signed: Pasty Spillers, PA 12/26/2021, 10:33 AM   I spent a total of 15 Minutes at the the patient's bedside AND on the patient's hospital floor or unit, greater than 50% of which was counseling/coordinating care for percutaneous cholecystotomy

## 2021-12-26 NOTE — Progress Notes (Signed)
Nutrition Follow-up  DOCUMENTATION CODES:   Not applicable  INTERVENTION:  - will d/c Boost Breeze - continue 30 ml Prosource Plus once/day. - will increase Ensure from once/day to BID.   NUTRITION DIAGNOSIS:   Increased nutrient needs related to acute illness as evidenced by estimated needs. -ongoing  GOAL:   Patient will meet greater than or equal to 90% of their needs -meeting on average  MONITOR:   PO intake, Supplement acceptance, Labs, Weight trends   ASSESSMENT:   71 y.o. 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 He presented to the ED via EMS from home after his home health aide found him on the ground after he slid out of bed. He has history of variceal changes on [place], denies blood in stool or dark stools. He was admitted for shock thought to be acute on chronic anemia and possible sepsis.  Patient sitting up in bed with no visitors present at the time of RD visit.   Flow sheet documentation indicates he has been eating 50-100% at meals over the past 5 days. Review of orders indicates he has been accepting Prosource 100% of the time offered and Ensure 75% of the time offered.  RN shares with RD that patient does not care for [facility].  Patient shares that appetite has been fairly good overall. His lunch tray and some ice arrived at the time of RD visit so aided patient in getting set up for meal.  Weight has been trending up; weight on 5/22 was 196 lb and today is 226 lb. Non-pitting edema to BUE and RLE and moderate pitting edema to LLE and perineal area documented in the edema section of flow sheet.   Reviewed findings and events from 5/26-6/28. Percutaneous cholecystostomy placed on 5/27.  MD note today indicates plan for SNF once hemoglobin is stable.    Labs reviewed; Na:  134 mmol/l, creatinine: 0.41 mg/dl, Ca: 7.5 mg/dl, Mg: 1.6 mg/dl, LFTs elevated but down over the past 2 days.  Medications reviewed; 20 g lactulose BID, 50 mcg oral synthroid/day,  g IV Mg sulfate x1 run 5/30, 1 tablet multivitamin with minerals/day, 40 mg IV protonix BID.   Diet Order:   Diet Order             DIET DYS 3 Room service appropriate? Yes; Fluid consistency: Thin  Diet effective now                   EDUCATION NEEDS:   Not appropriate for education at this time  Skin:  Skin Assessment: Skin Integrity Issues: Skin Integrity Issues:: DTI DTI: L foot  Last BM:  5/24 (type 6 x1, medium amount)  Height:   Ht Readings from Last 1 Encounters:  12/22/21 6' (1.829 m)    Weight:   Wt Readings from Last 1 Encounters:  12/26/21 102.5 kg     BMI:  Body mass index is 30.65 kg/m.  Estimated Nutritional Needs:  Kcal:  1800-2000 kcal Protein:  90-105 grams Fluid:  >/= 2 L/day     Jarome Matin, MS, RD, LDN Registered Dietitian II Inpatient Clinical Nutrition RD pager # and on-call/weekend pager # available in [facility]

## 2021-12-26 NOTE — Progress Notes (Signed)
Pharmacy Antibiotic Note  [name] Heard [name]. is a 71 y.o. male admitted on 12/18/2021 with  intra abdominal infection .  Pharmacy has been consulted for Unasyn dosing.  ID: sepsis, UTI vs SBP WBC 2.8 down, Tmax 99,   5/22 Vancomycin >> 5/23,  5/27 >>5/30 5/22 Cefepime >>5/24,  5/27 >>5/30 5/24 Ceftriaxone >> 5/27 5/24 flagyl>>5/30 5/30 Unasyn>>   5/22 BCx x2: neg 5/22 UCx: ngf 5/22 MRSA PCR: detected 5/24 MRSA PCR: detected 5/27: BC x 2: NGTD 5/27 Bile Cx: Enterococcus Faecalis AND Faecium, abundant lactobacillus  Plan: Unasyn 3g IV q6hr Awaiting abx sensitivities   Height: 6' (182.9 cm) Weight: 102.5 kg (225 lb 15.5 oz) IBW/kg (Calculated) : 77.6  Temp (24hrs), Avg:98.5 F (36.9 C), Min:98.2 F (36.8 C), Max:99 F (37.2 C)  Recent Labs  Lab 12/22/21 0413 12/22/21 0825 12/22/21 2345 12/23/21 1448 12/24/21 0309 12/25/21 0317 12/26/21 0310  WBC  --  4.1 4.6  --  3.9* 4.0 2.8*  CREATININE 0.61  --  0.55*  --  0.65 0.56* 0.41*  LATICACIDVEN  --   --   --  2.2*  --   --   --     Estimated Creatinine Clearance: 103.4 mL/min (A) (by C-G formula based on SCr of 0.41 mg/dL (L)).    Allergies  Allergen Reactions   Clindamycin Dermatitis     Aubryn Spinola S. Alford Highland, PharmD, BCPS Clinical Staff Pharmacist [email or web address] [place] 12/26/2021 7:18 AM

## 2021-12-26 NOTE — Progress Notes (Signed)
Subjective: Finally sleeping.  Son at bedside.  Eating well per son.  ROS: See above, otherwise other systems negative  Objective: Vital signs in last 24 hours: Temp:  [97.8 F (36.6 C)-99 F (37.2 C)] 97.8 F (36.6 C) (05/30 0956) Pulse Rate:  [92-99] 93 (05/30 0956) Resp:  [16-27] 26 (05/30 0956) BP: (135-152)/(64-68) 139/66 (05/30 0956) SpO2:  [95 %-99 %] 95 % (05/30 0956) Weight:  [102.5 kg] 102.5 kg (05/30 0500) Last BM Date : 12/25/21  Intake/Output from previous day: 05/29 0701 - 05/30 0700 In: 2335.4 [P.O.:980; I.V.:5; IV Piggyback:1350.4] Out: 3820 [Urine:3695; Drains:125] Intake/Output this shift: No intake/output data recorded.  PE: Gen: NAD, sleeping Abd: soft, perc chole drain with bilious output  Lab Results:  Recent Labs    12/26/21 0310 12/26/21 0704  WBC 2.8* 3.0*  HGB 6.6* 6.7*  HCT 19.8* 20.2*  PLT 27* 22*   BMET Recent Labs    12/25/21 0317 12/26/21 0310  NA 134* 134*  K 3.7 3.6  CL 104 104  CO2 24 24  GLUCOSE 138* 118*  BUN 17 16  CREATININE 0.56* 0.41*  CALCIUM 7.8* 7.5*   PT/INR Recent Labs    12/25/21 0317 12/26/21 0310  LABPROT 17.2* 17.3*  INR 1.4* 1.4*   CMP     Component Value Date/Time   NA 134 (L) 12/26/2021 0310   K 3.6 12/26/2021 0310   CL 104 12/26/2021 0310   CO2 24 12/26/2021 0310   GLUCOSE 118 (H) 12/26/2021 0310   BUN 16 12/26/2021 0310   CREATININE 0.41 (L) 12/26/2021 0310   CALCIUM 7.5 (L) 12/26/2021 0310   CALCIUM 9.8 02/26/2012 1428   PROT 4.7 (L) 12/26/2021 0310   ALBUMIN 1.8 (L) 12/26/2021 0310   AST 111 (H) 12/26/2021 0310   ALT 89 (H) 12/26/2021 0310   ALKPHOS 47 12/26/2021 0310   BILITOT 1.3 (H) 12/26/2021 0310   GFRNONAA >60 12/26/2021 0310   GFRAA >60 04/06/2020 1507   Lipase     Component Value Date/Time   LIPASE 30 12/18/2021 1124       Studies/Results: No results found.  Anti-infectives: Anti-infectives (From admission, onward)    Start     Dose/Rate Route  Frequency Ordered Stop   12/26/21 0800  Ampicillin-Sulbactam (UNASYN) 3 g in sodium chloride 0.9 % 100 mL IVPB        3 g 200 mL/hr over 30 Minutes Intravenous Every 6 hours 12/26/21 0711     12/23/21 1100  vancomycin (VANCOREADY) IVPB 1250 mg/250 mL  Status:  Discontinued        1,250 mg 166.7 mL/hr over 90 Minutes Intravenous Every 12 hours 12/23/21 0922 12/26/21 0703   12/23/21 1000  ceFEPIme (MAXIPIME) 2 g in sodium chloride 0.9 % 100 mL IVPB  Status:  Discontinued        2 g 200 mL/hr over 30 Minutes Intravenous Every 8 hours 12/23/21 0922 12/26/21 0703   12/22/21 2048  metroNIDAZOLE (FLAGYL) IVPB 500 mg  Status:  Discontinued        500 mg 100 mL/hr over 60 Minutes Intravenous Every 12 hours 12/22/21 2049 12/26/21 0703   12/21/21 1100  cefTRIAXone (ROCEPHIN) 2 g in sodium chloride 0.9 % 100 mL IVPB  Status:  Discontinued        2 g 200 mL/hr over 30 Minutes Intravenous Every 24 hours 12/21/21 0900 12/23/21 0904   12/20/21 1830  metroNIDAZOLE (FLAGYL) IVPB 500 mg  Status:  Discontinued  500 mg 100 mL/hr over 60 Minutes Intravenous Every 12 hours 12/20/21 1727 12/22/21 2049   12/20/21 1100  cefTRIAXone (ROCEPHIN) 1 g in sodium chloride 0.9 % 100 mL IVPB  Status:  Discontinued        1 g 200 mL/hr over 30 Minutes Intravenous Every 24 hours 12/20/21 1006 12/21/21 0900   12/19/21 2000  vancomycin (VANCOREADY) IVPB 1250 mg/250 mL  Status:  Discontinued        1,250 mg 166.7 mL/hr over 90 Minutes Intravenous Every 24 hours 12/18/21 1946 12/19/21 1207   12/19/21 1100  ceFEPIme (MAXIPIME) 2 g in sodium chloride 0.9 % 100 mL IVPB  Status:  Discontinued        2 g 200 mL/hr over 30 Minutes Intravenous Every 12 hours 12/19/21 1038 12/20/21 1006   12/19/21 0600  ceFEPIme (MAXIPIME) 2 g in sodium chloride 0.9 % 100 mL IVPB  Status:  Discontinued        2 g 200 mL/hr over 30 Minutes Intravenous Every 12 hours 12/18/21 1943 12/19/21 1038   12/18/21 1845  vancomycin (VANCOREADY) IVPB 2000  mg/400 mL        2,000 mg 200 mL/hr over 120 Minutes Intravenous STAT 12/18/21 1828 12/18/21 2151   12/18/21 1830  ceFEPIme (MAXIPIME) 2 g in sodium chloride 0.9 % 100 mL IVPB        2 g 200 mL/hr over 30 Minutes Intravenous STAT 12/18/21 1818 12/18/21 1951        Assessment/Plan Cirrhosis, H/O hep C, Cholecystitis s/p per chole drain -tolerating soft diet -improving after perc chole drain -cont abx therapy - I have sent a message to our office to send a referral to [facility] for follow up regarding his cholecystitis and +/- whether they feel he is an operative candidate for interval lap chole in 6-8 weeks.  This was discussed with his son who is at the bedside.  Patient is very high risk for surgical intervention.  FEN - D3 diet VTE - on hold due to thrombocytopenia ID - Rocephin/Flagyl  Hypovolemic shock Encephalopathy AKI Decompensated liver failure Anemia - hgb 6.7 Thrombocytopenia - plts 22K today Acute splenic infarct  I reviewed hospitalist notes, last 24 h vitals and pain scores, last 48 h intake and output, last 24 h labs and trends, and last 24 h imaging results.   LOS: 8 days    Henreitta Cea , [facility] 12/26/2021, 10:00 AM Please see Amion for pager number during day hours 7:00am-4:30pm or 7:00am -11:30am on weekends

## 2021-12-27 ENCOUNTER — Inpatient Hospital Stay (HOSPITAL_COMMUNITY): Payer: Medicare Other

## 2021-12-27 DIAGNOSIS — I739 Peripheral vascular disease, unspecified: Secondary | ICD-10-CM

## 2021-12-27 DIAGNOSIS — A419 Sepsis, unspecified organism: Secondary | ICD-10-CM | POA: Diagnosis not present

## 2021-12-27 DIAGNOSIS — R932 Abnormal findings on diagnostic imaging of liver and biliary tract: Secondary | ICD-10-CM | POA: Diagnosis not present

## 2021-12-27 DIAGNOSIS — G9341 Metabolic encephalopathy: Secondary | ICD-10-CM | POA: Diagnosis not present

## 2021-12-27 DIAGNOSIS — F419 Anxiety disorder, unspecified: Secondary | ICD-10-CM

## 2021-12-27 DIAGNOSIS — D599 Acquired hemolytic anemia, unspecified: Secondary | ICD-10-CM

## 2021-12-27 DIAGNOSIS — M79605 Pain in left leg: Secondary | ICD-10-CM

## 2021-12-27 DIAGNOSIS — M79652 Pain in left thigh: Secondary | ICD-10-CM | POA: Diagnosis not present

## 2021-12-27 LAB — CBC
HCT: 25.2 % — ABNORMAL LOW (ref 39.0–52.0)
Hemoglobin: 8.2 g/dL — ABNORMAL LOW (ref 13.0–17.0)
MCH: 31.5 pg (ref 26.0–34.0)
MCHC: 32.5 g/dL (ref 30.0–36.0)
MCV: 96.9 fL (ref 80.0–100.0)
Platelets: 25 10*3/uL — CL (ref 150–400)
RBC: 2.6 MIL/uL — ABNORMAL LOW (ref 4.22–5.81)
RDW: 18.3 % — ABNORMAL HIGH (ref 11.5–15.5)
WBC: 3.2 10*3/uL — ABNORMAL LOW (ref 4.0–10.5)
nRBC: 0 % (ref 0.0–0.2)

## 2021-12-27 LAB — BPAM PLATELET PHERESIS
Blood Product Expiration Date: 202305282359
Blood Product Expiration Date: 202305302359
ISSUE DATE / TIME: 202305280055
Unit Type and Rh: 5100
Unit Type and Rh: 6200

## 2021-12-27 LAB — PREPARE PLATELET PHERESIS
Unit division: 0
Unit division: 0

## 2021-12-27 LAB — COMPREHENSIVE METABOLIC PANEL
ALT: 70 U/L — ABNORMAL HIGH (ref 0–44)
AST: 86 U/L — ABNORMAL HIGH (ref 15–41)
Albumin: 1.8 g/dL — ABNORMAL LOW (ref 3.5–5.0)
Alkaline Phosphatase: 55 U/L (ref 38–126)
Anion gap: 7 (ref 5–15)
BUN: 16 mg/dL (ref 8–23)
CO2: 24 mmol/L (ref 22–32)
Calcium: 7.8 mg/dL — ABNORMAL LOW (ref 8.9–10.3)
Chloride: 106 mmol/L (ref 98–111)
Creatinine, Ser: 0.46 mg/dL — ABNORMAL LOW (ref 0.61–1.24)
GFR, Estimated: >60 mL/min (ref >60)
Glucose, Bld: 130 mg/dL — ABNORMAL HIGH (ref 70–99)
Potassium: 3.6 mmol/L (ref 3.5–5.1)
Sodium: 137 mmol/L (ref 135–145)
Total Bilirubin: 1.1 mg/dL (ref 0.3–1.2)
Total Protein: 4.8 g/dL — ABNORMAL LOW (ref 6.5–8.1)

## 2021-12-27 LAB — CBC WITH DIFFERENTIAL/PLATELET
Abs Immature Granulocytes: 0.04 10*3/uL (ref 0.00–0.07)
Basophils Absolute: 0 10*3/uL (ref 0.0–0.1)
Basophils Relative: 1 %
Eosinophils Absolute: 0.1 10*3/uL (ref 0.0–0.5)
Eosinophils Relative: 2 %
HCT: 28.6 % — ABNORMAL LOW (ref 39.0–52.0)
Hemoglobin: 8.9 g/dL — ABNORMAL LOW (ref 13.0–17.0)
Immature Granulocytes: 1 %
Lymphocytes Relative: 18 %
Lymphs Abs: 0.7 10*3/uL (ref 0.7–4.0)
MCH: 30.7 pg (ref 26.0–34.0)
MCHC: 31.1 g/dL (ref 30.0–36.0)
MCV: 98.6 fL (ref 80.0–100.0)
Monocytes Absolute: 0.2 10*3/uL (ref 0.1–1.0)
Monocytes Relative: 5 %
Neutro Abs: 2.9 10*3/uL (ref 1.7–7.7)
Neutrophils Relative %: 73 %
Platelets: 28 10*3/uL — CL (ref 150–400)
RBC: 2.9 MIL/uL — ABNORMAL LOW (ref 4.22–5.81)
RDW: 18.2 % — ABNORMAL HIGH (ref 11.5–15.5)
WBC: 3.9 10*3/uL — ABNORMAL LOW (ref 4.0–10.5)
nRBC: 0 % (ref 0.0–0.2)

## 2021-12-27 LAB — AMMONIA: Ammonia: 34 umol/L (ref 9–35)

## 2021-12-27 LAB — TYPE AND SCREEN
ABO/RH(D): B NEG
Antibody Screen: POSITIVE
Unit division: 0

## 2021-12-27 LAB — BPAM RBC
Blood Product Expiration Date: 202306302359
ISSUE DATE / TIME: 202305300935
Unit Type and Rh: 1700

## 2021-12-27 LAB — PROTIME-INR
INR: 1.4 — ABNORMAL HIGH (ref 0.8–1.2)
Prothrombin Time: 16.8 seconds — ABNORMAL HIGH (ref 11.4–15.2)

## 2021-12-27 LAB — CK: Total CK: 1873 U/L — ABNORMAL HIGH (ref 49–397)

## 2021-12-27 LAB — MAGNESIUM: Magnesium: 1.6 mg/dL — ABNORMAL LOW (ref 1.7–2.4)

## 2021-12-27 MED ORDER — PANTOPRAZOLE SODIUM 40 MG PO TBEC
40.0000 mg | DELAYED_RELEASE_TABLET | Freq: Two times a day (BID) | ORAL | Status: DC
  Administered 2021-12-27 – 2022-01-04 (×18): 40 mg via ORAL
  Filled 2021-12-27 (×18): qty 1

## 2021-12-27 MED ORDER — MAGNESIUM OXIDE -MG SUPPLEMENT 400 (240 MG) MG PO TABS
400.0000 mg | ORAL_TABLET | Freq: Two times a day (BID) | ORAL | Status: DC
  Administered 2021-12-27 – 2022-01-04 (×18): 400 mg via ORAL
  Filled 2021-12-27 (×18): qty 1

## 2021-12-27 MED ORDER — MAGNESIUM SULFATE 4 GM/100ML IV SOLN
4.0000 g | Freq: Once | INTRAVENOUS | Status: AC
Start: 2021-12-27 — End: 2021-12-27
  Administered 2021-12-27: 4 g via INTRAVENOUS
  Filled 2021-12-27: qty 100

## 2021-12-27 MED ORDER — IOHEXOL 350 MG/ML SOLN
100.0000 mL | Freq: Once | INTRAVENOUS | Status: AC | PRN
Start: 2021-12-27 — End: 2021-12-27
  Administered 2021-12-27: 100 mL via INTRAVENOUS

## 2021-12-27 NOTE — Assessment & Plan Note (Addendum)
-  In the setting of Liver Cirrhosis -Patient's hemoglobin/hematocrit is now mildly improved after his blood transfusion is now 7.5/23.3; continue to monitor for signs and symptoms of overt bleeding and likely this is in the setting of hemolytic anemia -S/p transfusion of 9 units of pRBCs, 1 unit of FFP and 1 unit of Transfused Platelet Pheresis  -Continue monitor and trend and continue monitor for signs or symptoms of bleeding; no overt bleeding noted -May need some more blood transfusions but have initiated Lasix and steroids; increase the dose of Lasix yesterday given his blood transfusion -Repeat CBC in a.m.

## 2021-12-27 NOTE — TOC Progression Note (Addendum)
Transition of Care (TOC) - Progression Note    Patient Details  Name: [name]. MRN: [id number] Date of Birth: 1950/11/10  Transition of Care [facility]) CM/SW Contact  Leeroy Cha, RN Phone Number: 12/27/2021, 9:03 AM  Clinical Narrative:    Following for snf placement when ready. List of accepting facilities discussed with the son.  He wants to talk with the md before giving a decision.  Md made aware. Planned dc is for tomorrow [id number]/kathy campbell made aware and given the sons phone number. Expected Discharge Plan: [place] Barriers to Discharge: Continued Medical Work up  Expected Discharge Plan and Services Expected Discharge Plan: [place] In-house Referral: Clinical Social Work   Post Acute Care Choice: [place] Living arrangements for the past 2 months: Apartment                 DME Arranged: N/A DME Agency: NA                   Social Determinants of Health (SDOH) Interventions    Readmission Risk Interventions     View : No data to display.

## 2021-12-27 NOTE — Assessment & Plan Note (Addendum)
-   Improving significantly; patient's AST was 2284 and ALT was 545 and now trended down; AST is now 60 and ALT is now 24 -He is status post percutaneous cholecystostomy tube -Continue monitor and trend LFTs carefully

## 2021-12-27 NOTE — Progress Notes (Signed)
Patient stable with perc chole drain.  He will not be following up with [facility] as he needs a tertiary care facility for all of his liver issues, etc.  Our office is making a referral to [facility] for a surgeon/hepatologist there to help address these issues.  We will fade away at this point.  Available as needed.  Henreitta Cea 8:59 AM 12/27/2021

## 2021-12-27 NOTE — Care Management Important Message (Signed)
Important Message  Patient Details IM Letter placed in Patients room for Son. Name: [name]. MRN: [id number] Date of Birth: July 19, 1951   Medicare Important Message Given:  Yes     Kerin Salen 12/27/2021, 12:30 PM

## 2021-12-27 NOTE — Progress Notes (Signed)
Occupational Therapy Treatment Patient Details Name: [name]. MRN: [id number] DOB: Mar 31, 1951 Today's Date: 12/27/2021   History of present illness 71 year-old M with PMH of EtOH cirrhosis, nonimmune hemolytic anemia, pancytopenia, right AKA and hepatitis C brought to ED 12/18/21 with altered mental status after he slid off his bed and found down on the ground.. Patient  admitted to ICU with hypovolemic shock, hemolytic anemia, liver failure, encephalopathy, AKI and hyperkalemia.   OT comments  Patient complains of pain as soon as therapist enters the room and therapist had patient request it via use of call bell. Patient's LE edematous and firm to touch and patient unable to actively move leg. He reports pain at rest and increased pain with passive ROM - to attempt to decrease stiffness and promote edema reduction - though he tolerated minimal due to pain. Patient's scrotum is excessively edematous as well and therapist elevated on towel. Patient able to perform UE exercises at bed level. He continues to report he can't feel his lower leg. He is hopeful with better timing with pain medicine to attempt to sit up on next treatment. Patient has exhibited a decline in functional abilities and impairments. Therapist continues to recommend short term rehab but will downgrade goals.   Recommendations for follow up therapy are one component of a multi-disciplinary discharge planning process, led by the attending physician.  Recommendations may be updated based on patient status, additional functional criteria and insurance authorization.    Follow Up Recommendations  Skilled nursing-short term rehab (<3 hours/day)    Assistance Recommended at Discharge Frequent or constant Supervision/Assistance  Patient can return home with the following  Two people to help with walking and/or transfers;A lot of help with bathing/dressing/bathroom;Assistance with cooking/housework;Help with stairs or ramp for  entrance;Direct supervision/assist for medications management;Direct supervision/assist for financial management;Assist for transportation   Equipment Recommendations  None recommended by OT    Recommendations for Other Services      Precautions / Restrictions Precautions Precautions: Fall Precaution Comments: R AKA, profound  weakness  left  hip, knee, demonstrates no ankle dorsi/plantar flex,  lacks P/AROM Knee extension appearing contracted;  per patient, contracture PTA(WC dep)also Restrictions Weight Bearing Restrictions: No Other Position/Activity Restrictions: will not be able to stand on Left leg         Extremity/Trunk Assessment              Vision Baseline Vision/History: 1 Wears glasses Patient Visual Report: No change from baseline Additional Comments: readers   Perception     Praxis      Cognition Arousal/Alertness: Awake/alert Behavior During Therapy: WFL for tasks assessed/performed Overall Cognitive Status: History of cognitive impairments - at baseline                                 General Comments: Hx of some cognitive decline memory deficits        Exercises Other Exercises Other Exercises: Attempted to range LLE - to assist with reducing stiffness and edema - minimal tolerance due to pain and patient unable to assist. Other Exercises: Shoulder flexion 1 lb weight x 20 each arm Other Exercises: Chest Press holding cylindrical foam pillow in room x 20 Other Exercises: Shouler Press holding cylindrical foam pillow in room x 20    Shoulder Instructions       General Comments      Pertinent Vitals/ Pain  Pain Assessment Pain Assessment: Faces Faces Pain Scale: Hurts whole lot Pain Location: LLE Pain Descriptors / Indicators: Discomfort, Aching, Guarding Pain Intervention(s): Monitored during session, Patient requesting pain meds-RN notified  Home Living                                           Prior Functioning/Environment              Frequency  Min 2X/week        Progress Toward Goals  OT Goals(current goals can now be found in the care plan section)  Progress towards OT goals: Goals drowngraded-see care plan  Acute Rehab OT Goals Patient Stated Goal: sit up OT Goal Formulation: With patient Time For Goal Achievement: 01/04/22 Potential to Achieve Goals: [place] Discharge plan remains appropriate    Co-evaluation          OT goals addressed during session: Strengthening/ROM      AM-PAC OT "6 Clicks" Daily Activity     Outcome Measure   Help from another person eating meals?: A Little Help from another person taking care of personal grooming?: A Little Help from another person toileting, which includes using toliet, bedpan, or urinal?: Total Help from another person bathing (including washing, rinsing, drying)?: A Lot Help from another person to put on and taking off regular upper body clothing?: A Lot Help from another person to put on and taking off regular lower body clothing?: Total 6 Click Score: 12    End of Session Equipment Utilized During Treatment: Gait belt  OT Visit Diagnosis: Muscle weakness (generalized) (M62.81);Other abnormalities of gait and mobility (R26.89);Pain Pain - Right/Left: Left Pain - part of body: Hip;Knee   Activity Tolerance Patient limited by pain   Patient Left in bed;with call bell/phone within reach;with family/visitor present;with bed alarm set   Nurse Communication Patient requests pain meds        Time: 1135-1204 OT Time Calculation (min): 29 min  Charges: OT General Charges $OT Visit: 1 Visit OT Treatments $Therapeutic Exercise: 8-22 mins  Derl Barrow, OTR/L [place]  Office [phone] Pager: [place] 12/27/2021, 1:21 PM

## 2021-12-27 NOTE — Progress Notes (Signed)
Referring Physician(s): Lang Snow, NP  Supervising Physician: Sandi Mariscal  Patient Status:  [facility] - In-pt  Chief Complaint:  Cholecystitis s/p cholecystostomy drain 12/23/21 with Dr. Dwaine Gale  Subjective:  Pt resting in bed with son at bedside. He states he is feeling a little better. He denies N/V and states he ate 100% of lunch tray. He endorses tenderness at perc chole insertion site.   Allergies: Clindamycin  Medications: Prior to Admission medications   Medication Sig Start Date End Date Taking? Authorizing Provider  ALPRAZolam Duanne Moron) 1 MG tablet Take 1 mg by mouth 3 (three) times daily.   Yes [provider]  Cholecalciferol (VITAMIN D3) 10 MCG (400 UNIT) CAPS Take 4 capsules by mouth daily.   Yes [provider]  folic acid (FOLVITE) 1 MG tablet TAKE 1 TABLET(1 MG) BY MOUTH DAILY Patient taking differently: Take 1 mg by mouth daily. 10/12/21  Yes Burns, Wandra Feinstein, NP  hydrochlorothiazide (HYDRODIURIL) 12.5 MG tablet TAKE 1 TABLET(12.5 MG) BY MOUTH DAILY Patient taking differently: Take 12.5 mg by mouth daily. 10/27/21  Yes [name], FNP  levothyroxine (SYNTHROID) 50 MCG tablet TAKE 1 TABLET(50 MCG) BY MOUTH DAILY BEFORE BREAKFAST Patient taking differently: Take 50 mcg by mouth daily before breakfast. 12/14/21  Yes Arnett, [name], FNP  losartan (COZAAR) 100 MG tablet TAKE 1 TABLET(100 MG) BY MOUTH DAILY Patient taking differently: Take 100 mg by mouth daily. 11/14/21  Yes Arnett, [name], FNP  lubiprostone (AMITIZA) 24 MCG capsule Take 24 mcg by mouth daily as needed. 08/13/14  Yes [provider]  meloxicam (MOBIC) 15 MG tablet Take 15 mg by mouth every morning. 01/12/21  Yes [provider]  morphine (MS CONTIN) 15 MG 12 hr tablet Take 15 mg by mouth 2 (two) times daily as needed for pain. 04/06/20  Yes [provider]  Oxycodone HCl 20 MG TABS Take 20 mg by mouth 4 (four) times daily. 12/04/21  Yes [provider]  pantoprazole (PROTONIX) 40 MG tablet Take 1 tablet (40 mg total) by mouth daily. 12/18/20 12/18/21 Yes Sharen Hones, MD  vitamin B-12 (CYANOCOBALAMIN) 1000 MCG tablet Take 1 tablet (1,000 mcg total) by mouth daily. 04/09/18  Yes Bettey Costa, MD  NARCAN 4 MG/0.1ML LIQD nasal spray kit Place 1 spray into the nose once. 12/29/18   [provider]     Vital Signs: BP (!) 149/67 (BP Location: Left Arm)   Pulse 89   Temp 97.9 F (36.6 C) (Oral)   Resp 18   Ht 6' (1.829 m)   Wt 232 lb 2.3 oz (105.3 kg)   SpO2 100%   BMI 31.48 kg/m   Physical Exam Constitutional:      General: He is not in acute distress. Eyes:     Extraocular Movements: Extraocular movements intact.     Pupils: Pupils are equal, round, and reactive to light.  Pulmonary:     Effort: Pulmonary effort is normal. No respiratory distress.  Abdominal:     Comments:  RUQ drain unremarkable with sutures/ statlock in place.     Insertion site slightly red with no oozing, bleeding or   swelling noted. Dressing C/D/I. Drain flushes/aspirates easily.   Skin:    General: Skin is warm and dry.  Neurological:     Mental Status: He is alert and oriented to person, place, and time.  Psychiatric:        Mood and Affect: Mood normal.  Behavior: Behavior normal.    Imaging: No results found.  Labs:  CBC: Recent Labs    12/26/21 0310 12/26/21 0704 12/26/21 1450 12/27/21 0411 12/27/21 1249  WBC 2.8* 3.0*  --  3.2* 3.9*  HGB 6.6* 6.7* 10.5* 8.2* 8.9*  HCT 19.8* 20.2* 31.9* 25.2* 28.6*  PLT 27* 22*  --  25* 28*    COAGS: Recent Labs    12/18/21 1124 12/21/21 0244 12/24/21 0309 12/25/21 0317 12/26/21 0310 12/27/21 0411  INR 1.8*   < > 1.3* 1.4* 1.4* 1.4*  APTT 32  --   --   --   --   --    < > = values in this interval not displayed.    BMP: Recent Labs    12/24/21 0309 12/25/21 0317 12/26/21 0310 12/27/21 0411  NA 133* 134* 134* 137  K 3.5 3.7 3.6 3.6  CL 103 104 104 106   CO2 26 24 24 24   GLUCOSE 149* 138* 118* 130*  BUN 15 17 16 16   CALCIUM 7.6* 7.8* 7.5* 7.8*  CREATININE 0.65 0.56* 0.41* 0.46*  GFRNONAA >60 >60 >60 >60    LIVER FUNCTION TESTS: Recent Labs    12/24/21 0309 12/25/21 0317 12/26/21 0310 12/27/21 0411  BILITOT 1.4* 1.6* 1.3* 1.1  AST 251* 193* 111* 86*  ALT 153* 125* 89* 70*  ALKPHOS 63 54 47 55  PROT 5.0* 5.0* 4.7* 4.8*  ALBUMIN 2.0* 1.9* 1.8* 1.8*    Assessment and Plan:  S/p cholecystostomy drain 12/23/21 with Dr. Dwaine Gale   Pt resting in bed with son at bedside. He states he is     feeling a little better today. He denies N/V and states he    ate 100% of lunch tray. He endorses tenderness at perc   chole insertion site.    RUQ drain unremarkable with sutures/ statlock in place.     Insertion site slightly red with no oozing, bleeding or   swelling noted. Dressing C/D/I. Drain flushes/aspirates easily. 25 cc tan,      bilious OP in gravity bag with 45 cc documented OP in 24     hours.   WBC WNL   VSS, afebrile    Continue TID flushes with 5 cc NS. Record output Q shift. Dressing changes QD or PRN if soiled.  Call IR APP or on call IR MD if difficulty flushing or sudden change in drain output.  Repeat imaging/possible drain injection once output < 10 mL/QD (excluding flush material.)   Discharge planning: Please contact IR APP or on call IR MD prior to patient d/c to ensure appropriate follow up plans are in place. Typically patient will follow up with IR clinic 10-14 days post d/c for repeat imaging/possible drain injection. IR scheduler will contact patient with date/time of appointment. Patient will need to flush drain QD with 5 cc NS, record output QD, dressing changes every 2-3 days or earlier if soiled.    IR will continue to follow - please call with questions or concerns.    Electronically Signed: Tyson Alias, NP 12/27/2021, 2:50 PM   I spent a total of 15 Minutes at the the patient's bedside AND on the  patient's hospital floor or unit, greater than 50% of which was counseling/coordinating care for cholecystomy drain.

## 2021-12-27 NOTE — Progress Notes (Addendum)
PROGRESS NOTE    [name].  DZH:[id number] DOB: [birth year] DOA: 12/18/2021 PCP: [name], FNP   Brief Narrative:  The patient is a 71 year old M with PMH of EtOH cirrhosis, nonimmune hemolytic anemia, pancytopenia, right AKA and hepatitis C brought to ED with altered mental status after he slid off his bed and found down on the ground by family, and admitted to ICU with hypovolemic shock, hemolytic anemia, liver failure, encephalopathy, AKI and hyperkalemia.  In ED, hypotensive and altered.  Hgb 4.0.  Lactic acid> 9.  AST 495.  ALT 107.  Bili 8.  He was started on Levophed, IV antibiotics and blood transfusion. Hematology consulted. Patient came off Levophed and transferred to [facility] hospitalist service on 5/24.    RUQ [place] concerning for liver cirrhosis and liver mass. 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  Liver cirrhosis, portal HTN, splenomegaly, ascites. General surgery consulted and recommended conservative care with IV antibiotics.   5/26-patient was on IV fluid for AKI and rhabdomyolysis but developed significant scrotal edema.  AKI resolved.  IV fluid discontinued.  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  He also had bloody stool later in the day.  Patient was moved to ICU.  PCCM, general surgery and IR consulted.  Antibiotics broadened.  527-transfused a unit of FFP and 2 units of PRBC.  Had percutaneous cholecystostomy tube.  Orthopedic surgery and GI consulted, and did not feel surgical intervention is indicated.  528-Transferred to progressive  care.  Started on p.o. Seroquel for delirium.  De-escalated antibiotics to IV Unasyn based on biliary culture.  No further GI bleed but blood counts dropped likely due to hemolytic anemia and he was transfused another 1 unit PRBCs on 12/26/2021 early in the AM.    12/27/21 Hemoglobin/hematocrit seems to have stabilized now.  He is still complaining of some leg pain so will be discussed with Ortho.  General surgery has now signed off the case and they are making a referral to [facility] for surgeon/hepatologist there to help address all of his liver issues.  IR evaluated and recommending continuing the Wilkinson catheter with 3 times daily flushes with 5 cc of normal saline acute guarding output every shift and following up in 6 to 8 weeks for replacement or for repeat imaging/possible drain injection.  Assessment and Plan: * Septic shock ([place]) -Patient was Tachypneic, hypotensive with AKI, lactic acidosis and thrombocytopenia, and required vasopressors on admission.   -Imaging raises concern for calculus cholecystitis, possible cholecystocolonic fistula left thigh muscle necrosis versus neck fasciitis.   -Blood and urine cultures NGTD.  Biliary culture with pansensitive Enterococcus faecalis and faecium as well as lactobacillus species.   -Hemodynamically stable.  No leukocytosis and mildly leukopenic  -High risk for surgical intervention -Percutaneous cholecystostomy tube by IR on 5/27 -CTX and Flagyl >>>Vanc, Cefepime and Flagyl 5/27>>> IV Unasyn 5/30>> -General surgery sent referral to [facility] for outpatient follow-up -Discharge plan in place from IR standpoint -Tolerating soft diet and likley to D/C to SNF   Acute pain of left thigh/rhabdomyolysis/myonecrosis -CT abdomen and pelvis shows muscular swelling with tracking gas within the proximal muscle  compartments likely myonecrosis from traumatic rhabdomyolysis versus neck fasciitis.   -CK improving. -Orthopedic surgery signed off but will have them  re-evaluate and they feel he is not a surgical candidate regardless but agree with Vascular Evaluation -MRI leg has been ordered for further investigation  -Continue monitoring closely   SVT (supraventricular tachycardia) ([place]) -One episode on 5/26.   -As symptomatic.  Normotensive.  Resolved with IV metoprolol. -C/w IV metoprolol as needed -Continue telemetry monitoring  Acute metabolic encephalopathy -Initially, patient was in septic shock with uremia and some degree of hepatic encephalopathy.  -Now he was having some delirium.  He is at risk for polypharmacy as well.  Delirium improved after low-dose Seroquel at night. -Seroquel 25 mg HS and IV Haldol 2 mg q6h PRN-risk and benefit discussed with sons.  QTc 432. -Continue to Treat treatable causes -Minimize sedating medications -Reorientation and delirium precautions.  AKI (acute kidney injury) ([place]) Recent Labs    12/18/21 1751 12/19/21 1030 12/20/21 0223 12/21/21 0244 12/22/21 0413 12/22/21 2345 12/24/21 0309 12/25/21 0317 12/26/21 0310 12/27/21 0411  BUN 42* 56* 63* 42* '22 16 15 17 16 16  '$ CREATININE 1.44* 1.70* 1.89* 1.11 0.61 0.55* 0.65 0.56* 0.41* 0.46*  -Likely due to hypotension and rhabdomyolysis.  Resolved and improved. -Recheck renal function in the morning -Avoid nephrotoxic medications, contrast dyes, hypotension and dehydration to ensure adequate renal perfusion and renally adjust medications -Repeat CMP in the a.m.   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 Elevated liver enzymes/hyperbilirubinemia; As Above Recent Labs  Lab 12/22/21 2345 12/24/21 0309 12/25/21 0317 12/26/21 0310 12/27/21 0411  AST 377* 251* 193* 111* 86*  ALT 205* 153* 125* 89* 70*  ALKPHOS 73 63 54 47 55  BILITOT 1.4* 1.4* 1.6* 1.3* 1.1  PROT 5.0* 5.0* 5.0* 4.7* 4.8*  ALBUMIN 2.0* 2.0* 1.9* 1.8* 1.8*  LFT pattern consistent rhabdo.  Rhabdo improving.  He has mild coagulopathy as well. 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 LDH markedly elevated.  Haptoglobin low.  Had a splenomegaly and splenic infarction.  Had frank red blood in his stool on 5/26 but no further bleed after FFP and platelet transfusion. EGD 5/22 with gastritis and duodenitis.  Colo in 5/22 with polyps, diverticulosis and internal hemorrhoid.  -Transfused a total of 6 units PRBC, 2 apheresis of platelet and 1 FFP so far. -Hgb 6.6 yesterday so he is transfuse another 1 unit of PRBCs -[facility] signed off. -Monitor H&H and transfuse for Hgb <7.0.  -Now hemoglobin/hematocrit is improved and on last check was 8.9-/28.6 -Repeat CBC in the AM   Splenic infarction -Wedge-shaped area of hypoenhancement involving the anterior spleen concerning for splenic infarction noted on CT abdomen and pelvis. -Per General Surgery. -They are planning to refer him out to [facility] for further issues; could be contributing to his thrombocytopenia  Left leg weakness and numbness -Likely from rhabdomyolysis/myonecrosis of left thigh. ABI with severe PAD and showed Resting left ankle-brachial index indicates severe left lower  extremity arterial disease. The left toe-brachial index is absent.  -He may need repeat ABI once he recovers -Closely monitor discussed with vascular surgery Dr. Trula Slade who will see  the patient later this evening for  Further evaluation and recommendations -Orthopedic Surgery following and the numbness could be related to the myonecrosis however after discussion with the vascular surgery team they are requesting Neurology input; after discussion with neurology they are recommending  obtaining MRI of the leg as well as MRI of the L-spine with and without contrast and have the orthopedic evaluate for possible compartment syndrome -I spoke with Dr. Kathaleen Bury and he feels the patient has baseline numbness in that leg with myonecrosis of thigh from being found down and he would not be a surgical candidate to release any compartments as it is well over 48 hours since onset of being found down and has nothing else to offer from his standpoint but agrees with Vascular Evaluation  -Per my discussion with Dr. Kathaleen Bury even if his MRI shows some hematomas he is likely not a surgical candidate given that cannot heal incisions and would be exceptionally high risk for infection and he feels the left thigh has been stable for the past week and the CT intra-muscular gas was incidentally identified on CT pelvis. -Dr. Kathaleen Bury feels that the patient has essentially no function of that LLE for past 2 years  Hyperkalemia, hyponatremia and hypomagnesemia Hyperkalemia resolved.   Hyponatremia resolved -Patient's Mag Level is now 1.6 -Replete with IV mag sulfate 4 g x 1 -Continue to monitor and replete as necessary; -Repeat CMP, mag, Phos in the AM   Thrombocytopenia ([place]) -Patient's Platelet Count went from 25 -> 25 -> 27 -> 22 -> 25 -> 28 -Had 2 apheresis of platelet on 5/27. -Transfuse for platelet < 10k or bleeding -Continue to Monitor for S/Sx of Bleeding; No overt bleeding noted  -Repeat CBC in the AM    Goals of care, counseling/discussion -Remains full code with full scope of care.  See discussion on 5/26 and 5/27 -Palliative medicine following and appreciate further evaluation   Abnormal MRI, liver-resolved as of 12/23/2021 -RUQ [place] concerning for 6 cm liver mass.  Not appreciated on CT abdomen with contrast. -Follow up in the outpatient   Physical deconditioning -Wheelchair-bound at baseline.  He says he is able to transfer using left leg at baseline. -PT/OT recommended  SNF -Left leg evaluation as above   Scrotal edema -Likely due in the setting of liver cirrhosis, hypoalbuminemia and IVF hydration.  -IV fluid discontinued.   -Improving. -Continue monitoring and will need Scrotal Sling -May discuss with Urology for further outpatient workup   Elevated troponin -Likely demand ischemia in the setting of sepsis and shock most likely in the setting of rhabdomyolysis -Denies any current chest pain  Calculus of bile duct with acute cholecystitis -See shock  Hyperbilirubinemia -Improving. T Bili went from 8.8 -> 1.1 -Continue to Monitor and Trend -Repeat CMP in the AM    Elevated liver enzymes - Improving significantly; patient's AST was 2284 and ALT was 545 and now trended down; AST is now 86 and ALT is now 47 -He is status post percutaneous cholecystostomy tube -Continue monitor and trend LFTs carefully  Chronic pain syndrome -C/w oxycodone to 20 mg every 6 hours.  Per database, fills 120 for 30 days -Discontinue IV Dilaudid  Hemolytic anemia (H[facility]) -In the setting of Liver Cirrhosis -Patient's hemoglobin/hematocrit did drop to 6.6/90.9 and he was transfused 1 unit PRBCs and then elevated to 10.5/31.9.  Repeat this morning was 8.2/25.2 and now hemoglobin/hematocrit is 8.9/28.6 -Continue monitor and trend and continue monitor for signs or symptoms of bleeding; no overt bleeding  noted -Repeat CBC in a.m.   Anxiety -Patient is adamant about his Xanax despite risk for polypharmacy and encephalopathy. -Resumed Alprazolam 1 mg po TIDprn Anxiety  DVT prophylaxis: SCDs Start: 12/18/21 1421    Code Status: Full Code Family Communication: Discussed with Son [name] over the telephone   Disposition Plan:  Level of care: Progressive Status is: Inpatient Remains inpatient appropriate because: Needs further clinical improvement anticipate discharging to SNF in the next 24 to 48 hours if stable  Consultants:  PCCM Transfer General Surgery Orthopedic  Surgery Oncology  Vascular Surgery Palliative Care Medicine Gastroenterology Interventional Radiology Discussed with Neurology   Procedures:  ABI MRI Liver CT Abd/Pelvis Procedure: 10.2 fr Cholecystostomy drain done by Dr. Sharen Heck Mir  Antimicrobials:  Anti-infectives (From admission, onward)    Start     Dose/Rate Route Frequency Ordered Stop   12/26/21 0800  Ampicillin-Sulbactam (UNASYN) 3 g in sodium chloride 0.9 % 100 mL IVPB        3 g 200 mL/hr over 30 Minutes Intravenous Every 6 hours 12/26/21 0711     12/23/21 1100  vancomycin (VANCOREADY) IVPB 1250 mg/250 mL  Status:  Discontinued        1,250 mg 166.7 mL/hr over 90 Minutes Intravenous Every 12 hours 12/23/21 0922 12/26/21 0703   12/23/21 1000  ceFEPIme (MAXIPIME) 2 g in sodium chloride 0.9 % 100 mL IVPB  Status:  Discontinued        2 g 200 mL/hr over 30 Minutes Intravenous Every 8 hours 12/23/21 0922 12/26/21 0703   12/22/21 2048  metroNIDAZOLE (FLAGYL) IVPB 500 mg  Status:  Discontinued        500 mg 100 mL/hr over 60 Minutes Intravenous Every 12 hours 12/22/21 2049 12/26/21 0703   12/21/21 1100  cefTRIAXone (ROCEPHIN) 2 g in sodium chloride 0.9 % 100 mL IVPB  Status:  Discontinued        2 g 200 mL/hr over 30 Minutes Intravenous Every 24 hours 12/21/21 0900 12/23/21 0904   12/20/21 1830  metroNIDAZOLE (FLAGYL) IVPB 500 mg  Status:  Discontinued        500 mg 100 mL/hr over 60 Minutes Intravenous Every 12 hours 12/20/21 1727 12/22/21 2049   12/20/21 1100  cefTRIAXone (ROCEPHIN) 1 g in sodium chloride 0.9 % 100 mL IVPB  Status:  Discontinued        1 g 200 mL/hr over 30 Minutes Intravenous Every 24 hours 12/20/21 1006 12/21/21 0900   12/19/21 2000  vancomycin (VANCOREADY) IVPB 1250 mg/250 mL  Status:  Discontinued        1,250 mg 166.7 mL/hr over 90 Minutes Intravenous Every 24 hours 12/18/21 1946 12/19/21 1207   12/19/21 1100  ceFEPIme (MAXIPIME) 2 g in sodium chloride 0.9 % 100 mL IVPB  Status:  Discontinued         2 g 200 mL/hr over 30 Minutes Intravenous Every 12 hours 12/19/21 1038 12/20/21 1006   12/19/21 0600  ceFEPIme (MAXIPIME) 2 g in sodium chloride 0.9 % 100 mL IVPB  Status:  Discontinued        2 g 200 mL/hr over 30 Minutes Intravenous Every 12 hours 12/18/21 1943 12/19/21 1038   12/18/21 1845  vancomycin (VANCOREADY) IVPB 2000 mg/400 mL        2,000 mg 200 mL/hr over 120 Minutes Intravenous STAT 12/18/21 1828 12/18/21 2151   12/18/21 1830  ceFEPIme (MAXIPIME) 2 g in sodium chloride 0.9 % 100 mL IVPB  2 g 200 mL/hr over 30 Minutes Intravenous STAT 12/18/21 1818 12/18/21 1951       Subjective: Seen and examined at bedside and he states that he is feeling "so-so".  Continues to have some leg pain in the left leg and scrotal swelling.  No nausea or vomiting.  Denies any chest pain or shortness of breath.  No other concerns or complaints at the time and states that he is basically wheelchair-bound and needs to get stronger.  States that the biliary drainage from his bag was little bit better today however he was assessed by general surgery.  He complains of some pain at the insertion site.  Objective: Vitals:   12/26/21 2012 12/27/21 0500 12/27/21 0612 12/27/21 1347  BP: 137/64  (!) 143/70 (!) 149/67  Pulse: 98  100 89  Resp: '20  19 18  '$ Temp: 98.5 F (36.9 C)  98.2 F (36.8 C) 97.9 F (36.6 C)  TempSrc: Oral  Oral Oral  SpO2: 96%  97% 100%  Weight:  105.3 kg    Height:        Intake/Output Summary (Last 24 hours) at 12/27/2021 1628 Last data filed at 12/27/2021 1604 Gross per 24 hour  Intake 2127 ml  Output 845 ml  Net 1282 ml   Filed Weights   12/24/21 1110 12/26/21 0500 12/27/21 0500  Weight: 101.4 kg 102.5 kg 105.3 kg   Examination: Physical Exam:  Constitutional: WN/WD obese chronically ill-appearing Caucasian male currently in no acute distress appears fatigued Respiratory: Diminished to auscultation bilaterally, no wheezing, rales, rhonchi or crackles. Normal  respiratory effort and patient is not tachypenic. No accessory muscle use.  Cardiovascular: RRR, no murmurs / rubs / gallops. S1 and S2 auscultated.  Has some lower extremity leg swelling and has a right AKA Abdomen: Soft, non-tender, distended secondary body habitus and now has a percutaneous cholecystostomy drain in place.  Bowel sounds positive.  GU: Deferred. Musculoskeletal: Right AKA Neurologic: CN 2-12 grossly intact with no focal deficits.  Psychiatric: Normal judgment and insight.  He is awake and alert  Data Reviewed: I have personally reviewed following labs and imaging studies  CBC: Recent Labs  Lab 12/25/21 0317 12/26/21 0310 12/26/21 0704 12/26/21 1450 12/27/21 0411 12/27/21 1249  WBC 4.0 2.8* 3.0*  --  3.2* 3.9*  NEUTROABS  --   --   --   --   --  2.9  HGB 7.5* 6.6* 6.7* 10.5* 8.2* 8.9*  HCT 22.4* 19.8* 20.2* 31.9* 25.2* 28.6*  MCV 95.7 97.5 95.7  --  96.9 98.6  PLT 25* 27* 22*  --  25* 28*   Basic Metabolic Panel: Recent Labs  Lab 12/22/21 0413 12/22/21 2345 12/24/21 0309 12/25/21 0317 12/26/21 0310 12/27/21 0411  NA 133* 134* 133* 134* 134* 137  K 3.8 3.8 3.5 3.7 3.6 3.6  CL 103 104 103 104 104 106  CO2 '24 25 26 24 24 24  '$ GLUCOSE 164* 147* 149* 138* 118* 130*  BUN '22 16 15 17 16 16  '$ CREATININE 0.61 0.55* 0.65 0.56* 0.41* 0.46*  CALCIUM 7.6* 7.6* 7.6* 7.8* 7.5* 7.8*  MG 1.6* 1.6* 1.5* 1.4* 1.6* 1.6*  PHOS 2.6 2.8 3.5 3.2 3.4  --    GFR: Estimated Creatinine Clearance: 104.7 mL/min (A) (by C-G formula based on SCr of 0.46 mg/dL (L)). Liver Function Tests: Recent Labs  Lab 12/22/21 2345 12/24/21 0309 12/25/21 0317 12/26/21 0310 12/27/21 0411  AST 377* 251* 193* 111* 86*  ALT 205* 153* 125*  89* 70*  ALKPHOS 73 63 54 47 55  BILITOT 1.4* 1.4* 1.6* 1.3* 1.1  PROT 5.0* 5.0* 5.0* 4.7* 4.8*  ALBUMIN 2.0* 2.0* 1.9* 1.8* 1.8*   No results for input(s): LIPASE, AMYLASE in the last 168 hours. Recent Labs  Lab 12/22/21 2345 12/24/21 0309  12/25/21 0317 12/26/21 0704 12/27/21 0411  AMMONIA 36* 21 33 32 34   Coagulation Profile: Recent Labs  Lab 12/22/21 2345 12/24/21 0309 12/25/21 0317 12/26/21 0310 12/27/21 0411  INR 1.3* 1.3* 1.4* 1.4* 1.4*   Cardiac Enzymes: Recent Labs  Lab 12/22/21 2345 12/24/21 0309 12/25/21 0317 12/26/21 0310 12/27/21 0411  CKTOTAL 9,675* 5,583* 4,082* 2,204* 1,873*   BNP (last 3 results) No results for input(s): PROBNP in the last 8760 hours. HbA1C: No results for input(s): HGBA1C in the last 72 hours. CBG: No results for input(s): GLUCAP in the last 168 hours. Lipid Profile: No results for input(s): CHOL, HDL, LDLCALC, TRIG, CHOLHDL, LDLDIRECT in the last 72 hours. Thyroid Function Tests: No results for input(s): TSH, T4TOTAL, FREET4, T3FREE, THYROIDAB in the last 72 hours. Anemia Panel: No results for input(s): VITAMINB12, FOLATE, FERRITIN, TIBC, IRON, RETICCTPCT in the last 72 hours. Sepsis Labs: Recent Labs  Lab 12/23/21 1448  LATICACIDVEN 2.2*    Recent Results (from the past 240 hour(s))  Blood Culture (routine x 2)     Status: None   Collection Time: 12/18/21 11:24 AM   Specimen: BLOOD  Result Value Ref Range Status   Specimen Description   Final    BLOOD BLOOD RIGHT HAND Performed at [facility] [address] [place], [place] [address]    Special Requests   Final    BOTTLES DRAWN AEROBIC AND ANAEROBIC Blood Culture adequate volume Performed at [facility] [address] [place], [place] [address]    Culture   Final    NO GROWTH 5 DAYS Performed at [facility], [place] [address], [place], [place] [address]    Report Status 12/23/2021 FINAL  Final  Blood Culture (routine x 2)     Status: None   Collection Time: 12/18/21 11:32 AM   Specimen: BLOOD  Result Value Ref Range Status   Specimen Description   Final    BLOOD RIGHT ANTECUBITAL Performed at [facility] [address],  [place], [place] [address]    Special Requests   Final    BOTTLES DRAWN AEROBIC AND ANAEROBIC Blood Culture results may not be optimal due to an excessive volume of blood received in culture bottles Performed at [facility] [address], [place], [place] [address]    Culture   Final    NO GROWTH 5 DAYS Performed at [facility], [place] [address] [place], [place] [address]    Report Status 12/23/2021 FINAL  Final  Urine Culture     Status: None   Collection Time: 12/18/21 11:39 AM   Specimen: In/Out Cath Urine  Result Value Ref Range Status   Specimen Description   Final    IN/OUT CATH URINE Performed at [facility] [address], [place], [place] [address]    Special Requests   Final    NONE Performed at [facility], [place] [address], [place], [place] [address]    Culture   Final    NO GROWTH Performed at [facility], [place] [address], [place],  [address]    Report Status 12/20/2021 FINAL  Final  Resp Panel by RT-PCR (Flu A&B, Covid) Nasopharyngeal Swab     Status: None   Collection Time: 12/18/21 12:22 PM   Specimen: Nasopharyngeal Swab; Nasopharyngeal(NP) swabs in vial transport medium  Result Value Ref Range Status   SARS Coronavirus 2 by RT PCR NEGATIVE NEGATIVE Final    Comment: (NOTE) SARS-CoV-2 target nucleic acids are NOT DETECTED.  The SARS-CoV-2 RNA is generally detectable in upper respiratory specimens during the acute phase of infection. The lowest concentration of SARS-CoV-2 viral copies this assay can detect is 138 copies/mL. A negative result does not preclude SARS-Cov-2 infection and should not be used as the sole basis for treatment or other patient management decisions. A negative result may occur with  improper specimen collection/handling, submission of specimen other than nasopharyngeal swab, presence of viral mutation(s) within the areas targeted by this assay, and inadequate  number of viral copies(<138 copies/mL). A negative result must be combined with clinical observations, patient history, and epidemiological information. The expected result is Negative.  Fact Sheet for Patients:  EntrepreneurPulse.com.au  Fact Sheet for Healthcare Providers:  IncredibleEmployment.be  This test is no t yet approved or cleared by the [place] FDA and  has been authorized for detection and/or diagnosis of SARS-CoV-2 by FDA under an Emergency Use Authorization (EUA). This EUA will remain  in effect (meaning this test can be used) for the duration of the COVID-19 declaration under Section 564(b)(1) of the Act, 21 U.S.C.section 360bbb-3(b)(1), unless the authorization is terminated  or revoked sooner.       Influenza A by PCR NEGATIVE NEGATIVE Final   Influenza B by PCR NEGATIVE NEGATIVE Final    Comment: (NOTE) The Xpert Xpress SARS-CoV-2/FLU/RSV plus assay is intended as an aid in the diagnosis of influenza from Nasopharyngeal swab specimens and should not be used as a sole basis for treatment. Nasal washings and aspirates are unacceptable for Xpert Xpress SARS-CoV-2/FLU/RSV testing.  Fact Sheet for Patients: EntrepreneurPulse.com.au  Fact Sheet for Healthcare Providers: IncredibleEmployment.be  This test is not yet approved or cleared by the [place] FDA and has been authorized for detection and/or diagnosis of SARS-CoV-2 by FDA under an Emergency Use Authorization (EUA). This EUA will remain in effect (meaning this test can be used) for the duration of the COVID-19 declaration under Section 564(b)(1) of the Act, 21 U.S.C. section 360bbb-3(b)(1), unless the authorization is terminated or revoked.  Performed at [facility], [place] [address] [place], [place] [address]   MRSA Next Gen by PCR, Nasal     Status: Abnormal   Collection Time: 12/20/21  9:40 AM    Specimen: Nasal Mucosa; Nasal Swab  Result Value Ref Range Status   MRSA by PCR Next Gen DETECTED (A) NOT DETECTED Final    Comment: (NOTE) The GeneXpert MRSA Assay (FDA approved for NASAL specimens only), is one component of a comprehensive MRSA colonization surveillance program. It is not intended to diagnose MRSA infection nor to guide or monitor treatment for MRSA infections. Test performance is not FDA approved in patients less than 73 years old. Performed at [facility], [place] [address] [place], [place] [address]   Aerobic/Anaerobic Culture w Gram Stain (surgical/deep wound)     Status: None (Preliminary result)   Collection Time: 12/23/21 11:58 AM   Specimen: BILE  Result Value Ref Range Status   Specimen Description   Final    BILE Performed at [facility] [address], [place], [place] [address]  Special Requests   Final    NONE Performed at [facility], [place] [address], [place], [place] [address]    Gram Stain   Final    ABUNDANT WBC PRESENT, PREDOMINANTLY MONONUCLEAR MODERATE GRAM POSITIVE COCCI IN PAIRS Performed at [facility], [place] [address], [place], [place] [address]    Culture   Final    MODERATE ENTEROCOCCUS FAECALIS MODERATE ENTEROCOCCUS FAECIUM ABUNDANT LACTOBACILLUS SPECIES Standardized susceptibility testing for this organism is not available. FEW CANDIDA ALBICANS NO ANAEROBES ISOLATED; CULTURE IN PROGRESS FOR 5 DAYS    Report Status PENDING  Incomplete   Organism ID, Bacteria ENTEROCOCCUS FAECALIS  Final   Organism ID, Bacteria ENTEROCOCCUS FAECIUM  Final      Susceptibility   Enterococcus faecalis - MIC*    AMPICILLIN <=2 SENSITIVE Sensitive     VANCOMYCIN 2 SENSITIVE Sensitive     GENTAMICIN SYNERGY SENSITIVE Sensitive     * MODERATE ENTEROCOCCUS FAECALIS   Enterococcus faecium - MIC*    AMPICILLIN <=2 SENSITIVE Sensitive     VANCOMYCIN <=0.5 SENSITIVE Sensitive      GENTAMICIN SYNERGY SENSITIVE Sensitive     * MODERATE ENTEROCOCCUS FAECIUM  Culture, blood (Routine X 2) w Reflex to ID Panel     Status: None (Preliminary result)   Collection Time: 12/23/21  2:48 PM   Specimen: BLOOD  Result Value Ref Range Status   Specimen Description   Final    BLOOD LEFT ANTECUBITAL Performed at [facility], [place] [address], [place], [place] [address]    Special Requests   Final    NONE Performed at [facility], [place] [address] [place], [place] [address]    Culture   Final    NO GROWTH 4 DAYS Performed at [facility], [place] [address], [place], [place] [address]    Report Status PENDING  Incomplete  Culture, blood (Routine X 2) w Reflex to ID Panel     Status: None (Preliminary result)   Collection Time: 12/23/21  2:48 PM   Specimen: BLOOD  Result Value Ref Range Status   Specimen Description   Final    BLOOD RIGHT ANTECUBITAL Performed at [facility], [place] [address], [place], [place] [address]    Special Requests   Final    NONE Performed at [facility], [place] [address] [place], [place] [address]    Culture   Final    NO GROWTH 4 DAYS Performed at [facility], [place] [address] [place], [place] [address]    Report Status PENDING  Incomplete     Radiology Studies: No results found.   Scheduled Meds:  (feeding supplement) PROSource Plus  30 mL Oral Daily   Chlorhexidine Gluconate Cloth  6 each Topical Q0600   feeding supplement  237 mL Oral BID BM   lactulose  20 g Oral BID   levothyroxine  50 mcg Oral Q0600   magnesium oxide  400 mg Oral BID   mouth rinse  15 mL Mouth Rinse BID   multivitamin with minerals  1 tablet Oral Daily   pantoprazole  40 mg Oral BID   QUEtiapine  25 mg Oral QHS   sodium chloride flush  5 mL Intracatheter Q8H   Continuous Infusions:  sodium chloride Stopped (12/23/21 0547)   ampicillin-sulbactam (UNASYN) IV 3 g (12/27/21 1340)    LOS: 9 days    Raiford Noble, DO [facility] Available via [employer]  secure chat 7am-7pm After these hours, please refer to coverage provider listed on [email or web address] 12/27/2021, 4:28 PM

## 2021-12-27 NOTE — Assessment & Plan Note (Addendum)
-  Improving. 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

## 2021-12-27 NOTE — Progress Notes (Addendum)
    OVERNIGHT PROGRESS REPORT  Notified by RN and MRI for concern of patient stability-anxiety for long duration MRI. Conferred with hospital physician and consensus was to complete CT of equivalent area.  Patient is amenable due to severe anxiety over test duration/position. Will follow process to have CT completed for attending.   Gershon Cull MSNA MSN ACNPC-AG Acute Care Nurse Practitioner [place]

## 2021-12-27 NOTE — Plan of Care (Addendum)

## 2021-12-27 NOTE — Progress Notes (Signed)
I have reviewed his CT scan with radiology tonight.  His vascular disease is likely the result of a high-grade right common femoral artery stenosis.  Because of his multiple comorbidities including cirrhosis and thrombocytopenia, he is currently not a operative candidate, nor would I stent his common femoral artery.  On CT scan he does have a significant amount of air in the medial compartment of the left thigh.  This was also seen on x-ray and initial abdominal CT scan when he presented.  Orthopedics has been following him for this.  I relayed the results of the CT scan to the covering hospitalist who will relay this information to the rounding team tomorrow.  I will defer to orthopedics for management of the myonecrosis and gas within the medial thigh compartment.  Annamarie Major

## 2021-12-27 NOTE — Consult Note (Signed)
[facility] [place]  Patient name: Derek Luginbill Quesnell Jr. MRN: [id number] DOB: August 14, 1950 Sex: male   REQUESTING PROVIDER:   Hospitalist   REASON FOR CONSULT:    Left leg wound with pain and numbness  HISTORY OF PRESENT ILLNESS:   Derek Obremski Uphoff [name]. is a 71 y.o. male, who was admitted to the hospital on 12/18/2021 with shock, encephalopathy, and severe anemia along with acute kidney injury.  He was found down on the ground by the family after he slid out of his bed.  A right upper quadrant ultrasound was concerning for cirrhosis as well as cholecystitis.  He has had a cholecystostomy drain placed.  He has been complaining of left leg pain and numbness.  He was diagnosed with myonecrosis with elevated CK levels which have been trending down.  This was felt to be secondary to traumatic rhabdomyolysis versus necrotizing fasciitis.  This has been followed by orthopedics.  He also has severe thrombocytopenia.  He has a history of traumatic right leg amputation.  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   Squamous cell carcinoma of skin 05/21/2018   R mid volar forearm   Thyroid disease    UROLITHIASIS, HX OF 12/23/2007     FAMILY HISTORY   Family History  Problem Relation Age of Onset   Heart disease Mother    Lung cancer Mother 58   Heart attack Paternal Grandmother    Heart attack Paternal Grandfather     Liver cancer Neg Hx     SOCIAL HISTORY:   Social History   Socioeconomic History   Marital status: Widowed    Spouse name: Not on file   Number of children: Not on file   Years of education: Not on file   Highest education level: Not on file  Occupational History   Occupation: Disabled  Tobacco Use   Smoking status: Never   Smokeless tobacco: Never  Substance and Sexual Activity   Alcohol use: Not Currently    Alcohol/week: 0.0 standard drinks   Drug use: No   Sexual activity: Not on file  Other Topics Concern   Not on file  Social History Narrative   Widowed 1997   Lives alone   Lost a son in his arms in 58.    3 sons   Social Determinants of Radio broadcast assistant Strain: Not on file  Food Insecurity: Not on file  Transportation Needs: Not on file  Physical Activity: Not on file  Stress: Not on file  Social Connections: Not on file  Intimate Partner Violence: Not on file    ALLERGIES:    Allergies  Allergen Reactions   Clindamycin Dermatitis    CURRENT MEDICATIONS:    Current Facility-Administered Medications  Medication Dose Route Frequency Provider Last Rate Last Admin   (feeding supplement) PROSource Plus liquid 30 mL  30  mL Oral Daily Wendee Beavers T, MD   30 mL at 12/26/21 2012   0.9 %  sodium chloride infusion   Intravenous PRN Mercy Riding, MD   Stopped at 12/23/21 0547   acetaminophen (TYLENOL) tablet 650 mg  650 mg Oral Q6H PRN Wendee Beavers T, MD   650 mg at 12/20/21 2232   ALPRAZolam (XANAX) tablet 1 mg  1 mg Oral TID PRN Wendee Beavers T, MD   1 mg at 12/27/21 1500   Ampicillin-Sulbactam (UNASYN) 3 g in sodium chloride 0.9 % 100 mL IVPB  3 g Intravenous Q6H Robertson, Crystal S, RPH 200 mL/hr at 12/27/21 1340 3 g at 12/27/21 1340   Chlorhexidine Gluconate Cloth 2 % PADS 6 each  6 each Topical Q0600 Mercy Riding, MD   6 each at 12/27/21 0700   docusate (COLACE) 50 MG/5ML liquid 100 mg  100 mg Oral BID PRN Mercy Riding, MD       feeding  supplement (ENSURE ENLIVE / ENSURE PLUS) liquid 237 mL  237 mL Oral BID BM Gonfa, Taye T, MD   237 mL at 12/27/21 1340   haloperidol lactate (HALDOL) injection 2 mg  2 mg Intramuscular Q6H PRN Mercy Riding, MD       lactulose (CHRONULAC) 10 GM/15ML solution 20 g  20 g Oral BID Wendee Beavers T, MD   20 g at 12/26/21 2017   levothyroxine (SYNTHROID) tablet 50 mcg  50 mcg Oral Q0600 Wendee Beavers T, MD   50 mcg at 12/27/21 0700   magnesium oxide (MAG-OX) tablet 400 mg  400 mg Oral BID Raiford Noble Wellington, DO   400 mg at 12/27/21 4235   MEDLINE mouth rinse  15 mL Mouth Rinse BID Gonfa, Taye T, MD   15 mL at 12/27/21 0900   metoprolol tartrate (LOPRESSOR) injection 5 mg  5 mg Intravenous Q6H PRN Mercy Riding, MD   5 mg at 12/24/21 1957   multivitamin with minerals tablet 1 tablet  1 tablet Oral Daily Mercy Riding, MD   1 tablet at 12/27/21 0854   oxyCODONE (Oxy IR/ROXICODONE) immediate release tablet 20 mg  20 mg Oral Q6H PRN Wendee Beavers T, MD   20 mg at 12/27/21 1300   pantoprazole (PROTONIX) EC tablet 40 mg  40 mg Oral BID Karren Cobble, RPH   40 mg at 12/27/21 0854   polyethylene glycol (MIRALAX / GLYCOLAX) packet 17 g  17 g Oral Daily PRN Mercy Riding, MD   17 g at 12/26/21 1750   QUEtiapine (SEROQUEL) tablet 25 mg  25 mg Oral QHS Wendee Beavers T, MD   25 mg at 12/26/21 2014   sodium chloride flush (NS) 0.9 % injection 5 mL  5 mL Intracatheter Q8H Wendee Beavers T, MD   5 mL at 12/27/21 1259    REVIEW OF SYSTEMS:   '[X]'$  denotes positive finding, '[ ]'$  denotes negative finding Cardiac  Comments:  Chest pain or chest pressure:    Shortness of breath upon exertion:    Short of breath when lying flat:    Irregular heart rhythm:        Vascular    Pain in calf, thigh, or hip brought on by ambulation:    Pain in feet at night that wakes you up from your sleep:     Blood clot in your veins:    Leg swelling:  x       Pulmonary  Oxygen at home:    Productive cough:     Wheezing:          Neurologic    Sudden weakness in arms or legs:     Sudden numbness in arms or legs:     Sudden onset of difficulty speaking or slurred speech:    Temporary loss of vision in one eye:     Problems with dizziness:         Gastrointestinal    Blood in stool:      Vomited blood:         Genitourinary    Burning when urinating:     Blood in urine:        Psychiatric    Major depression:         Hematologic    Bleeding problems:    Problems with blood clotting too easily:        Skin    Rashes or ulcers:        Constitutional    Fever or chills:     PHYSICAL EXAM:   Vitals:   12/26/21 2012 12/27/21 0500 12/27/21 0612 12/27/21 1347  BP: 137/64  (!) 143/70 (!) 149/67  Pulse: 98  100 89  Resp: '20  19 18  '$ Temp: 98.5 F (36.9 C)  98.2 F (36.8 C) 97.9 F (36.6 C)  TempSrc: Oral  Oral Oral  SpO2: 96%  97% 100%  Weight:  105.3 kg    Height:        GENERAL: The patient is a well-nourished male, in no acute distress. The vital signs are documented above. CARDIAC: There is a regular rate and rhythm.  VASCULAR: I could not palpate femoral pulses.  Significant edema throughout the entire left leg PULMONARY: Nonlabored respirations ABDOMEN: Soft and non-tender with normal pitched bowel sounds.  MUSCULOSKELETAL: There are no major deformities or cyanosis. NEUROLOGIC: No sensation in the left leg up to just below the knee SKIN: There are no ulcers or rashes noted. PSYCHIATRIC: The patient has a normal affect.  STUDIES:   I have reviewed his vascular lab studies with the following findings: ABI/TBIToday's ABIToday's TBIPrevious ABIPrevious TBI  +-------+-----------+-----------+------------+------------+  Left   0.32       absent                               +-------+-----------+-----------+------------+------------+    ASSESSMENT and PLAN   Left leg pain in the setting of severe peripheral vascular disease.  The patient's biggest complaints are that of leg  numbness, which I do not think is related to his vascular disease, but more likely his myonecrosis.  He does have some open areas on his left leg that may have difficulty healing both because of the swelling and because of his underlying vascular disease.  In addition he has multiple comorbidities that will limit what I can do for revascularization, in particular his severe thrombocytopenia, and his cirrhosis.  I cannot palpate femoral pulses, and so I think the first place to start is with a CT angiogram of the abdomen pelvis with bilateral runoff.  I will speak with his son who is his power of attorney later this evening.  I will follow-up after his CT scan has been performed.   Leia Alf, MD, FACS [facility] [phone] Pager [phone]

## 2021-12-28 DIAGNOSIS — Z515 Encounter for palliative care: Secondary | ICD-10-CM

## 2021-12-28 LAB — CBC WITH DIFFERENTIAL/PLATELET
Abs Immature Granulocytes: 0.03 10*3/uL (ref 0.00–0.07)
Basophils Absolute: 0 10*3/uL (ref 0.0–0.1)
Basophils Relative: 0 %
Eosinophils Absolute: 0.1 10*3/uL (ref 0.0–0.5)
Eosinophils Relative: 2 %
HCT: 25.2 % — ABNORMAL LOW (ref 39.0–52.0)
Hemoglobin: 8.1 g/dL — ABNORMAL LOW (ref 13.0–17.0)
Immature Granulocytes: 1 %
Lymphocytes Relative: 17 %
Lymphs Abs: 0.7 10*3/uL (ref 0.7–4.0)
MCH: 31.4 pg (ref 26.0–34.0)
MCHC: 32.1 g/dL (ref 30.0–36.0)
MCV: 97.7 fL (ref 80.0–100.0)
Monocytes Absolute: 0.2 10*3/uL (ref 0.1–1.0)
Monocytes Relative: 5 %
Neutro Abs: 3 10*3/uL (ref 1.7–7.7)
Neutrophils Relative %: 75 %
Platelets: 24 10*3/uL — CL (ref 150–400)
RBC: 2.58 MIL/uL — ABNORMAL LOW (ref 4.22–5.81)
RDW: 18.2 % — ABNORMAL HIGH (ref 11.5–15.5)
WBC: 4 10*3/uL (ref 4.0–10.5)
nRBC: 0 % (ref 0.0–0.2)

## 2021-12-28 LAB — CULTURE, BLOOD (ROUTINE X 2)
Culture: NO GROWTH
Culture: NO GROWTH

## 2021-12-28 LAB — COMPREHENSIVE METABOLIC PANEL
ALT: 57 U/L — ABNORMAL HIGH (ref 0–44)
AST: 83 U/L — ABNORMAL HIGH (ref 15–41)
Albumin: 1.8 g/dL — ABNORMAL LOW (ref 3.5–5.0)
Alkaline Phosphatase: 58 U/L (ref 38–126)
Anion gap: 8 (ref 5–15)
BUN: 14 mg/dL (ref 8–23)
CO2: 24 mmol/L (ref 22–32)
Calcium: 7.7 mg/dL — ABNORMAL LOW (ref 8.9–10.3)
Chloride: 103 mmol/L (ref 98–111)
Creatinine, Ser: 0.48 mg/dL — ABNORMAL LOW (ref 0.61–1.24)
GFR, Estimated: >60 mL/min (ref >60)
Glucose, Bld: 160 mg/dL — ABNORMAL HIGH (ref 70–99)
Potassium: 3.7 mmol/L (ref 3.5–5.1)
Sodium: 135 mmol/L (ref 135–145)
Total Bilirubin: 0.9 mg/dL (ref 0.3–1.2)
Total Protein: 4.8 g/dL — ABNORMAL LOW (ref 6.5–8.1)

## 2021-12-28 LAB — CK: Total CK: 2149 U/L — ABNORMAL HIGH (ref 49–397)

## 2021-12-28 LAB — AEROBIC/ANAEROBIC CULTURE W GRAM STAIN (SURGICAL/DEEP WOUND)

## 2021-12-28 LAB — PHOSPHORUS: Phosphorus: 3.3 mg/dL (ref 2.5–4.6)

## 2021-12-28 LAB — MAGNESIUM: Magnesium: 1.6 mg/dL — ABNORMAL LOW (ref 1.7–2.4)

## 2021-12-28 MED ORDER — POTASSIUM CHLORIDE CRYS ER 20 MEQ PO TBCR
40.0000 meq | EXTENDED_RELEASE_TABLET | Freq: Once | ORAL | Status: AC
  Administered 2021-12-28: 40 meq via ORAL
  Filled 2021-12-28: qty 2

## 2021-12-28 MED ORDER — OXYCODONE HCL 5 MG PO TABS
20.0000 mg | ORAL_TABLET | ORAL | Status: DC | PRN
  Administered 2021-12-28 – 2022-01-04 (×29): 20 mg via ORAL
  Filled 2021-12-28 (×30): qty 4

## 2021-12-28 MED ORDER — MAGNESIUM SULFATE 4 GM/100ML IV SOLN
4.0000 g | Freq: Once | INTRAVENOUS | Status: AC
Start: 2021-12-28 — End: 2021-12-28
  Administered 2021-12-28: 4 g via INTRAVENOUS
  Filled 2021-12-28: qty 100

## 2021-12-28 MED ORDER — FENTANYL 25 MCG/HR TD PT72
1.0000 | MEDICATED_PATCH | TRANSDERMAL | Status: DC
  Administered 2021-12-28: 1 via TRANSDERMAL
  Filled 2021-12-28: qty 1

## 2021-12-28 NOTE — Progress Notes (Signed)
PROGRESS NOTE    [name].  YCX:[id number] DOB: [birth year] DOA: 12/18/2021 PCP: [name], FNP   Brief Narrative:  The patient is a 71 year old M with PMH of EtOH cirrhosis, nonimmune hemolytic anemia, pancytopenia, right AKA and hepatitis C brought to ED with altered mental status after he slid off his bed and found down on the ground by family, and admitted to ICU with hypovolemic shock, hemolytic anemia, liver failure, encephalopathy, AKI and hyperkalemia.  In ED, hypotensive and altered.  Hgb 4.0.  Lactic acid> 9.  AST 495.  ALT 107.  Bili 8.  He was started on Levophed, IV antibiotics and blood transfusion. Hematology consulted. Patient came off Levophed and transferred to [facility] hospitalist service on 5/24.    RUQ [place] concerning for liver cirrhosis and liver mass. 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  Liver cirrhosis, portal HTN, splenomegaly, ascites. General surgery consulted and recommended conservative care with IV antibiotics.   5/26-patient was on IV fluid for AKI and rhabdomyolysis but developed significant scrotal edema.  AKI resolved.  IV fluid discontinued.  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  He also had bloody stool later in the day.  Patient was moved to ICU.  PCCM, general surgery and IR consulted.  Antibiotics broadened.  527-transfused a unit of FFP and 2 units of PRBC.  Had percutaneous cholecystostomy tube.  Orthopedic surgery and GI consulted, and did not feel surgical intervention is indicated.  528-Transferred to progressive  care.  Started on p.o. Seroquel for delirium.  De-escalated antibiotics to IV Unasyn based on biliary culture.  No further GI bleed but blood counts dropped likely due to hemolytic anemia and he was transfused another 1 unit PRBCs on 12/26/2021 early in the AM.    12/27/21 Hemoglobin/hematocrit seems to have stabilized now.  He is still complaining of some leg pain so will be discussed with Ortho.  General surgery has now signed off the case and they are making a referral to [facility] for surgeon/hepatologist there to help address all of his liver issues.  IR evaluated and recommending continuing the [place] catheter with 3 times daily flushes with 5 cc of normal saline acute guarding output every shift and following up in 6 to 8 weeks for replacement or for repeat imaging/possible drain injection.  5/31-6/1: Vascular surgery evaluated and he underwent a CTA of the abdomen and pelvis with bilateral runoff and Dr. Trula Slade reviewed with the radiologist and they feel his vascular disease likely result of a high-grade right common femoral artery stenosis and because of his multiple comorbidities including cirrhosis and thrombocytopenia he is not an operative candidate and he feels that they would not stent his common femoral artery given the comorbidities.  Orthopedic surgery evaluated his new CT angio and they feel that he still not a surgical candidate at this time given that he is clinically stable and slightly improving.  They feel that if his symptoms progress into florid infection and he requires surgery would be a radical series of debridements of the left thigh or left hip disarticulation which would be extremely morbid and associated with a very  high perioperative mortality.  Palliative care was consulted for further pain assistance and management and patient is improving and likely stable for discharge to the SNF on 12/29/2021.   Assessment and Plan: * Septic shock ([place]) -Patient was Tachypneic,  hypotensive with AKI, lactic acidosis and thrombocytopenia, and required vasopressors on admission.   -Imaging raises concern for calculus cholecystitis, possible cholecystocolonic fistula left thigh muscle necrosis versus neck fasciitis.   -Blood and urine cultures NGTD.  Biliary culture with pansensitive Enterococcus faecalis and faecium as well as lactobacillus species.   -Hemodynamically stable.  No leukocytosis and mildly leukopenic  -High risk for surgical intervention -Percutaneous cholecystostomy tube by IR on 5/27 -CTX and Flagyl >>>Vanc, Cefepime and Flagyl 5/27>>> IV Unasyn 5/30>> -General surgery sent referral to [facility] for outpatient follow-up -Discharge plan in place from IR standpoint -Tolerating soft diet and likley to D/C to SNF   Acute pain of left thigh/rhabdomyolysis/myonecrosis -CT abdomen and pelvis shows muscular swelling with tracking gas within the proximal muscle compartments likely myonecrosis from traumatic rhabdomyolysis versus neck fasciitis.   -CK improving. -Orthopedic surgery signed off but will have them re-evaluate and they feel he is not a surgical candidate regardless but agree with Vascular Evaluation -MRI leg has been ordered for further investigation but will discontinue this given his repeat CTA with bilateral runoff; See Above MRI discontinued as it won't change management  -Continue monitoring closely   SVT (supraventricular tachycardia) ([place]) -One episode on 5/26.   -As symptomatic.  Normotensive.  Resolved with IV metoprolol. -C/w IV metoprolol as needed -Continue telemetry monitoring while hospitalized   Acute metabolic encephalopathy -Initially, patient was in septic shock with uremia and some degree of hepatic encephalopathy.  -Now he was having some delirium.  He is at risk for polypharmacy as well.  Delirium improved after low-dose Seroquel at night. -Seroquel 25 mg HS and IV Haldol 2 mg q6h PRN-risk and benefit discussed with sons.  QTc  432. -Continue to Treat treatable causes -Minimize sedating medications -Reorientation and delirium precautions.  AKI (acute kidney injury) ([place]) Recent Labs    12/19/21 1030 12/20/21 0223 12/21/21 0244 12/22/21 0413 12/22/21 2345 12/24/21 0309 12/25/21 0317 12/26/21 0310 12/27/21 0411 12/28/21 0401  BUN 56* 63* 42* '22 16 15 17 16 16 14  '$ CREATININE 1.70* 1.89* 1.11 0.61 0.55* 0.65 0.56* 0.41* 0.46* 0.48*  -Likely due to hypotension and rhabdomyolysis.  Resolved and improved. -Recheck renal function in the morning; renal function is much improved at 14/0.48 now -Avoid nephrotoxic medications, contrast dyes, hypotension and dehydration to ensure adequate renal perfusion and renally adjust medications -Repeat CMP in the a.m.   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 Elevated liver enzymes/hyperbilirubinemia; As Above Recent Labs  Lab 12/24/21 0309 12/25/21 0317 12/26/21 0310 12/27/21 0411 12/28/21 0401  AST 251* 193* 111* 86* 83*  ALT 153* 125* 89* 70* 57*  ALKPHOS 63 54 47 55 58  BILITOT 1.4* 1.6* 1.3* 1.1 0.9  PROT 5.0* 5.0* 4.7* 4.8* 4.8*  ALBUMIN 2.0* 1.9* 1.8* 1.8* 1.8*  LFT pattern consistent rhabdo.  Rhabdo improving.  He has mild coagulopathy as well. 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 LDH markedly elevated.  Haptoglobin low.  Had a splenomegaly and splenic infarction.  Had frank red blood in his stool on 5/26 but no further bleed after FFP and platelet transfusion. EGD 5/22 with gastritis and duodenitis.  Colo in 5/22 with polyps, diverticulosis and internal hemorrhoid.  -Transfused a total of 6 units PRBC, 2  apheresis of platelet and 1 FFP so far. -Hgb 6.6 the day before yesterday so he is transfused another 1 unit of PRBCs -[facility] signed off. -Monitor H&H and transfuse for Hgb <7.0.  -Now hemoglobin/hematocrit is improved and on last check was 8.9/28.6 yesterday and is now 8.1/25.2 -Repeat CBC in the AM   Splenic infarction -Wedge-shaped area of hypoenhancement involving the anterior spleen concerning for splenic infarction noted on CT abdomen and pelvis. Repeat CTA shows stable lesion  -Per General Surgery. -They are planning to refer him out to [facility] for further issues; could be contributing to his thrombocytopenia  Left leg weakness and numbness -Likely from rhabdomyolysis/myonecrosis of left thigh. ABI with severe PAD and showed Resting left ankle-brachial index indicates severe left lower  extremity arterial disease. The left toe-brachial index is absent.  -He may need repeat ABI once he recovers -Closely monitor discussed with vascular surgery Dr. Trula Slade who will see the patient later this evening for  Further evaluation and recommendations -Orthopedic Surgery following and the numbness could be related to the myonecrosis however after discussion with the vascular surgery team they are requesting Neurology input; after discussion with neurology they are recommending obtaining MRI of the leg as well as MRI of the L-spine with and without contrast and have the orthopedic evaluate for possible compartment syndrome; patient was to undergo MRIs however vascular evaluated and recommended getting a CT of the abdomen pelvis bilateral runoff; given that an MRI would not change his management will discontinue the MRIs -I spoke with Dr. Kathaleen Bury and he feels the patient has baseline numbness in that leg with myonecrosis of thigh from being found down and he would not be a surgical candidate to release any compartments as it is well over 48 hours since onset of being found  down and has nothing else to offer from his standpoint but agrees with Vascular Evaluation  -Per my discussion with Dr. Kathaleen Bury even if his MRI shows some hematomas he is likely not a surgical candidate given that cannot heal incisions and would be exceptionally high risk for infection and he feels the left thigh has been stable for the past week and the CT intra-muscular gas was incidentally identified on CT pelvis. -Dr. Kathaleen Bury feels that the patient has essentially no function of that LLE for past 2 years -Orthopedic surgery evaluated and evaluated his repeat CTA and they still feel that he is still not a surgical candidate and they feel that if his symptoms progressed to forward infection and he require surgery it would be likely be a radical serial debridement of the left thigh or left hip disarticulation which will either be extremely morbid associated with very high perioperative mortality -Vascular surgery evaluated and they feel that his vascular disease is likely the result of a very high-grade right common femoral artery stenosis but because of his multiple comorbidities including cirrhosis and thrombocytopenia is not a current operative candidate and they would not recommend stenting of common femoral artery at this time  Hyperkalemia, hyponatremia and hypomagnesemia Hyperkalemia resolved.   Hyponatremia resolved; last sodium went from 137 is now 135 -Patient's Mag Level is now 1.6 again -Replete with  IV mag sulfate 4 g x 1 and will replete with p.o. K. Dur 40 mEq x 1 -Continue to monitor and replete as necessary; -Repeat CMP, mag, Phos in the AM   Thrombocytopenia ([place]) -Patient's Platelet Count went from 25 -> 25 -> 27 -> 22 -> 25 -> 28 -> 24 -Had 2 apheresis of platelet on 5/27. -Transfuse for platelet < 10k or bleeding -Continue to Monitor for S/Sx of Bleeding; No overt bleeding noted  -Repeat CBC in the AM   Goals of care, counseling/discussion -Remains full code with  full scope of care.  See discussion on 5/26 and 5/27 -Palliative medicine following and appreciate further evaluation   Abnormal MRI, liver-resolved as of 12/23/2021 -RUQ [place] concerning for 6 cm liver mass.  Not appreciated on CT abdomen with contrast. -Follow up in the outpatient with GI and General Surgery  Physical deconditioning -Wheelchair-bound at baseline.  He says he is able to transfer using left leg at baseline. -PT/OT recommended SNF -Left leg evaluation as above   Scrotal edema -Likely due in the setting of liver cirrhosis, hypoalbuminemia and IVF hydration.  -IV fluid discontinued.   -Improving. -Continue monitoring and will need Scrotal Sling and Scrotal Support -May discuss with Urology for further outpatient workup   Elevated troponin -Likely demand ischemia in the setting of sepsis and shock most likely in the setting of rhabdomyolysis -Denies any current chest pain  Calculus of bile duct with acute cholecystitis -See shock  Traumatic rhabdomyolysis ([place]) -CK was improving and went from 10,683 at its peak and is now 1873 yesterday and 2149 today   Hyperbilirubinemia -Improving. T Bili went from 8.8 -> 0.9 -Continue to Monitor and Trend -Repeat CMP in the AM    Elevated liver enzymes - Improving significantly; patient's AST was 2284 and ALT was 545 and now trended down; AST is now 83 and ALT is now 46 -He is status post percutaneous cholecystostomy tube -Continue monitor and trend LFTs carefully  Chronic pain syndrome -C/w oxycodone to 20 mg every 6 hours.  Per database, fills 120 for 30 days -Palliative care consulted for further goals of care and pain adjustment milligrams to the patient on a fentanyl patch -Discontinue IV Dilaudid  Hemolytic anemia (HCC) -In the setting of Liver Cirrhosis -Patient's hemoglobin/hematocrit did drop to 6.6/90.9 and he was transfused 1 unit PRBCs and then elevated to 10.5/31.9.  Repeat this morning was 8.2/25.2 and now  hemoglobin/hematocrit is 8.9/28.6 -> 8.1/25.2 -Continue monitor and trend and continue monitor for signs or symptoms of bleeding; no overt bleeding noted -Repeat CBC in a.m.   Anxiety -Patient is adamant about his Xanax despite risk for polypharmacy and encephalopathy. -Resumed Alprazolam 1 mg po TIDprn Anxiety  DVT prophylaxis: SCDs Start: 12/18/21 1421    Code Status: Full Code Family Communication: Discussed with Son extensively at bedside   Disposition Plan:  Level of care: Progressive Status is: Inpatient Remains inpatient appropriate because: SNF   Consultants:  PCCM Transfer General Surgery Orthopedic Surgery Oncology  Vascular Surgery Palliative Care Medicine Gastroenterology Interventional Radiology Discussed with Neurology   Procedures:  ABI MRI Liver CT Abd/Pelvis Procedure: 10.2 fr Cholecystostomy drain done by Dr. Sharen Heck Mir Repeat CTA Abd/Pelvis  Antimicrobials:  Anti-infectives (From admission, onward)    Start     Dose/Rate Route Frequency Ordered Stop   12/26/21 0800  Ampicillin-Sulbactam (UNASYN) 3 g in sodium chloride 0.9 % 100 mL IVPB        3 g 200 mL/hr over 30 Minutes  Intravenous Every 6 hours 12/26/21 0711     12/23/21 1100  vancomycin (VANCOREADY) IVPB 1250 mg/250 mL  Status:  Discontinued        1,250 mg 166.7 mL/hr over 90 Minutes Intravenous Every 12 hours 12/23/21 0922 12/26/21 0703   12/23/21 1000  ceFEPIme (MAXIPIME) 2 g in sodium chloride 0.9 % 100 mL IVPB  Status:  Discontinued        2 g 200 mL/hr over 30 Minutes Intravenous Every 8 hours 12/23/21 0922 12/26/21 0703   12/22/21 2048  metroNIDAZOLE (FLAGYL) IVPB 500 mg  Status:  Discontinued        500 mg 100 mL/hr over 60 Minutes Intravenous Every 12 hours 12/22/21 2049 12/26/21 0703   12/21/21 1100  cefTRIAXone (ROCEPHIN) 2 g in sodium chloride 0.9 % 100 mL IVPB  Status:  Discontinued        2 g 200 mL/hr over 30 Minutes Intravenous Every 24 hours 12/21/21 0900 12/23/21 0904    12/20/21 1830  metroNIDAZOLE (FLAGYL) IVPB 500 mg  Status:  Discontinued        500 mg 100 mL/hr over 60 Minutes Intravenous Every 12 hours 12/20/21 1727 12/22/21 2049   12/20/21 1100  cefTRIAXone (ROCEPHIN) 1 g in sodium chloride 0.9 % 100 mL IVPB  Status:  Discontinued        1 g 200 mL/hr over 30 Minutes Intravenous Every 24 hours 12/20/21 1006 12/21/21 0900   12/19/21 2000  vancomycin (VANCOREADY) IVPB 1250 mg/250 mL  Status:  Discontinued        1,250 mg 166.7 mL/hr over 90 Minutes Intravenous Every 24 hours 12/18/21 1946 12/19/21 1207   12/19/21 1100  ceFEPIme (MAXIPIME) 2 g in sodium chloride 0.9 % 100 mL IVPB  Status:  Discontinued        2 g 200 mL/hr over 30 Minutes Intravenous Every 12 hours 12/19/21 1038 12/20/21 1006   12/19/21 0600  ceFEPIme (MAXIPIME) 2 g in sodium chloride 0.9 % 100 mL IVPB  Status:  Discontinued        2 g 200 mL/hr over 30 Minutes Intravenous Every 12 hours 12/18/21 1943 12/19/21 1038   12/18/21 1845  vancomycin (VANCOREADY) IVPB 2000 mg/400 mL        2,000 mg 200 mL/hr over 120 Minutes Intravenous STAT 12/18/21 1828 12/18/21 2151   12/18/21 1830  ceFEPIme (MAXIPIME) 2 g in sodium chloride 0.9 % 100 mL IVPB        2 g 200 mL/hr over 30 Minutes Intravenous STAT 12/18/21 1818 12/18/21 1951       Subjective: Seen and examined at bedside and he thinks his numbness is doing a little bit better today.  Still has some pain.  Hopeful that he will get better eventually.  No nausea or vomiting.  Son at bedside and understands that he is not a surgical candidate.  Plan is to go to SNF and palliative care has been consulted for pain adjustments.  Patient denies any chest pain or shortness breath.  No other concerns or plaints at this time.  Objective: Vitals:   12/27/21 2103 12/28/21 0500 12/28/21 0511 12/28/21 1311  BP: 139/60  (!) 133/57 (!) 142/72  Pulse: 98  (!) 102 (!) 101  Resp: '20  18 18  '$ Temp: 98.2 F (36.8 C)  98.1 F (36.7 C) 100.1 F (37.8 C)   TempSrc: Oral  Oral Oral  SpO2: 92%  96% 96%  Weight:  109.7 kg    Height:  Intake/Output Summary (Last 24 hours) at 12/28/2021 1924 Last data filed at 12/28/2021 1816 Gross per 24 hour  Intake 2310 ml  Output 1910 ml  Net 400 ml   Filed Weights   12/26/21 0500 12/27/21 0500 12/28/21 0500  Weight: 102.5 kg 105.3 kg 109.7 kg   Examination: Physical Exam:  Constitutional: WN/WD obese chronically ill-appearing Caucasian male currently who appears fatigued but in no acute distress Respiratory: Diminished to auscultation bilaterally, no wheezing, rales, rhonchi or crackles. Normal respiratory effort and patient is not tachypenic. No accessory muscle use.  Unlabored breathing Cardiovascular: RRR, no murmurs / rubs / gallops. S1 and S2 auscultated.  Has some lower extremity swelling on the left Abdomen: Soft, non-tender, distended secondary body habitus and has a percutaneous cholecystostomy drain in place. Bowel sounds positive.  GU: Deferred. Musculoskeletal: Has a right AKA Skin: Has some lesions and ulcers on his left leg Neurologic: CN 2-12 grossly intact with no focal deficits. Psychiatric: Normal judgment and insight. Alert and oriented x 3. Normal mood and appropriate affect.   Data Reviewed: I have personally reviewed following labs and imaging studies  CBC: Recent Labs  Lab 12/26/21 0310 12/26/21 0704 12/26/21 1450 12/27/21 0411 12/27/21 1249 12/28/21 0401  WBC 2.8* 3.0*  --  3.2* 3.9* 4.0  NEUTROABS  --   --   --   --  2.9 3.0  HGB 6.6* 6.7* 10.5* 8.2* 8.9* 8.1*  HCT 19.8* 20.2* 31.9* 25.2* 28.6* 25.2*  MCV 97.5 95.7  --  96.9 98.6 97.7  PLT 27* 22*  --  25* 28* 24*   Basic Metabolic Panel: Recent Labs  Lab 12/22/21 2345 12/24/21 0309 12/25/21 0317 12/26/21 0310 12/27/21 0411 12/28/21 0401  NA 134* 133* 134* 134* 137 135  K 3.8 3.5 3.7 3.6 3.6 3.7  CL 104 103 104 104 106 103  CO2 '25 26 24 24 24 24  '$ GLUCOSE 147* 149* 138* 118* 130* 160*  BUN '16 15  17 16 16 14  '$ CREATININE 0.55* 0.65 0.56* 0.41* 0.46* 0.48*  CALCIUM 7.6* 7.6* 7.8* 7.5* 7.8* 7.7*  MG 1.6* 1.5* 1.4* 1.6* 1.6* 1.6*  PHOS 2.8 3.5 3.2 3.4  --  3.3   GFR: Estimated Creatinine Clearance: 106.7 mL/min (A) (by C-G formula based on SCr of 0.48 mg/dL (L)). Liver Function Tests: Recent Labs  Lab 12/24/21 0309 12/25/21 0317 12/26/21 0310 12/27/21 0411 12/28/21 0401  AST 251* 193* 111* 86* 83*  ALT 153* 125* 89* 70* 57*  ALKPHOS 63 54 47 55 58  BILITOT 1.4* 1.6* 1.3* 1.1 0.9  PROT 5.0* 5.0* 4.7* 4.8* 4.8*  ALBUMIN 2.0* 1.9* 1.8* 1.8* 1.8*   No results for input(s): LIPASE, AMYLASE in the last 168 hours. Recent Labs  Lab 12/22/21 2345 12/24/21 0309 12/25/21 0317 12/26/21 0704 12/27/21 0411  AMMONIA 36* 21 33 32 34   Coagulation Profile: Recent Labs  Lab 12/22/21 2345 12/24/21 0309 12/25/21 0317 12/26/21 0310 12/27/21 0411  INR 1.3* 1.3* 1.4* 1.4* 1.4*   Cardiac Enzymes: Recent Labs  Lab 12/24/21 0309 12/25/21 0317 12/26/21 0310 12/27/21 0411 12/28/21 0401  CKTOTAL 5,583* 4,082* 2,204* 1,873* 2,149*   BNP (last 3 results) No results for input(s): PROBNP in the last 8760 hours. HbA1C: No results for input(s): HGBA1C in the last 72 hours. CBG: No results for input(s): GLUCAP in the last 168 hours. Lipid Profile: No results for input(s): CHOL, HDL, LDLCALC, TRIG, CHOLHDL, LDLDIRECT in the last 72 hours. Thyroid Function Tests: No results for input(s):  TSH, T4TOTAL, FREET4, T3FREE, THYROIDAB in the last 72 hours. Anemia Panel: No results for input(s): VITAMINB12, FOLATE, FERRITIN, TIBC, IRON, RETICCTPCT in the last 72 hours. Sepsis Labs: Recent Labs  Lab 12/23/21 1448  LATICACIDVEN 2.2*    Recent Results (from the past 240 hour(s))  MRSA Next Gen by PCR, Nasal     Status: Abnormal   Collection Time: 12/20/21  9:40 AM   Specimen: Nasal Mucosa; Nasal Swab  Result Value Ref Range Status   MRSA by PCR Next Gen DETECTED (A) NOT DETECTED Final     Comment: (NOTE) The GeneXpert MRSA Assay (FDA approved for NASAL specimens only), is one component of a comprehensive MRSA colonization surveillance program. It is not intended to diagnose MRSA infection nor to guide or monitor treatment for MRSA infections. Test performance is not FDA approved in patients less than 72 years old. Performed at [facility], [place] [address] [place], [place] [address]   Aerobic/Anaerobic Culture w Gram Stain (surgical/deep wound)     Status: None   Collection Time: 12/23/21 11:58 AM   Specimen: BILE  Result Value Ref Range Status   Specimen Description   Final    BILE Performed at [place] [address], [place], [place] [address]    Special Requests   Final    NONE Performed at [facility], [place] [address] [place], [place] [address]    Gram Stain   Final    ABUNDANT WBC PRESENT, PREDOMINANTLY MONONUCLEAR MODERATE GRAM POSITIVE COCCI IN PAIRS Performed at [facility], [place] [address] [place], [place] [address]    Culture   Final    MODERATE ENTEROCOCCUS FAECALIS MODERATE ENTEROCOCCUS FAECIUM ABUNDANT LACTOBACILLUS SPECIES Standardized susceptibility testing for this organism is not available. FEW CANDIDA ALBICANS NO ANAEROBES ISOLATED; CULTURE IN PROGRESS FOR 5 DAYS    Report Status 12/28/2021 FINAL  Final   Organism ID, Bacteria ENTEROCOCCUS FAECALIS  Final   Organism ID, Bacteria ENTEROCOCCUS FAECIUM  Final      Susceptibility   Enterococcus faecalis - MIC*    AMPICILLIN <=2 SENSITIVE Sensitive     VANCOMYCIN 2 SENSITIVE Sensitive     GENTAMICIN SYNERGY SENSITIVE Sensitive     * MODERATE ENTEROCOCCUS FAECALIS   Enterococcus faecium - MIC*    AMPICILLIN <=2 SENSITIVE Sensitive     VANCOMYCIN <=0.5 SENSITIVE Sensitive     GENTAMICIN SYNERGY SENSITIVE Sensitive     * MODERATE ENTEROCOCCUS FAECIUM  Culture, blood (Routine X 2) w Reflex to ID Panel     Status: None    Collection Time: 12/23/21  2:48 PM   Specimen: BLOOD  Result Value Ref Range Status   Specimen Description   Final    BLOOD LEFT ANTECUBITAL Performed at [facility], [place] [address] [place], [place] [address]    Special Requests   Final    NONE Performed at [facility], [place] [address] [place], [place] [address]    Culture   Final    NO GROWTH 5 DAYS Performed at [facility], [place] [address] [place], [place] [address]    Report Status 12/28/2021 FINAL  Final  Culture, blood (Routine X 2) w Reflex to ID Panel     Status: None   Collection Time: 12/23/21  2:48 PM   Specimen: BLOOD  Result Value Ref Range Status   Specimen Description   Final    BLOOD RIGHT ANTECUBITAL Performed at [facility], [place] [address] [place], [place] [address]    Special Requests   Final    NONE Performed at [facility], [place] [address], [place], [place] [address]    Culture   Final    NO GROWTH 5 DAYS Performed at [facility], [place] [address], [place], [place] [address]    Report Status 12/28/2021 FINAL  Final     Radiology Studies: CT ANGIO AO+BIFEM W & OR WO CONTRAST  Addendum Date: 12/27/2021   ADDENDUM REPORT: 12/27/2021 23:09 ADDENDUM: These results were called by telephone at the time of interpretation on 12/27/2021 at 11:07 pm to provider Harold Barban , who verbally acknowledged these results. Electronically Signed   By: Ronney Asters M.D.   On: 12/27/2021 23:09   Result Date: 12/27/2021 CLINICAL DATA:  Lower extremity vascular trauma. EXAM: CT ANGIOGRAPHY OF ABDOMINAL AORTA WITH ILIOFEMORAL RUNOFF TECHNIQUE: Multidetector CT imaging of the abdomen, pelvis and left lower extremity was performed using the standard protocol during bolus administration of intravenous contrast. Multiplanar CT image reconstructions and MIPs were obtained to evaluate the vascular anatomy. RADIATION DOSE REDUCTION: This exam was performed according to  the departmental dose-optimization program which includes automated exposure control, adjustment of the mA and/or kV according to patient size and/or use of iterative reconstruction technique. CONTRAST:  135m OMNIPAQUE IOHEXOL 350 MG/ML SOLN COMPARISON:  Left femur x-ray 10/23/2021. CT abdomen and pelvis 12/22/2021. FINDINGS: VASCULAR Aorta: Normal caliber aorta without aneurysm, dissection, vasculitis or significant stenosis. There is severe atherosclerotic calcifications of the aorta. Celiac: There is moderate focal stenosis at the origin of the celiac artery secondary to calcified atherosclerotic disease. Vessels are otherwise patent. SMA: There is severe stenosis in the proximal superior mesenteric artery secondary to calcified atherosclerotic disease measuring 15 mm in length. Vessels otherwise patent. Renals: Left renal artery appears patent. There is a questionable stent at its origin. There is severe stenosis at the origin of the right renal artery secondary to calcified atherosclerotic disease. The vessels are grossly patent distally. IMA: Patent without evidence of aneurysm, dissection, vasculitis or significant stenosis. LEFT Lower Extremity Inflow: There is calcified atherosclerotic disease in the left common iliac artery. Left common iliac artery and left external iliac artery appear patent without focal stenosis or dissection. Outflow: There is severe focal stenosis in the left common iliac artery proximally secondary to calcified atherosclerotic disease. Flow is seen distally within the left common femoral artery. Flow is also identified within the superficial and profunda femoral arteries, although some calcified plaque is present. There is some mild areas of stenosis. No severe areas of stenosis, dissection or thrombosis identified. Popliteal artery patent. Runoff: Patent three vessel runoff to the ankle. Veins: No obvious venous abnormality within the limitations of this arterial phase study.  Review of the MIP images confirms the above findings. NON-VASCULAR Lower chest: There is bibasilar atelectasis. Hepatobiliary: There is a new percutaneous cholecystostomy tube in place. Gallstones are again seen. Air in the gallbladder is again seen. Gallbladder wall thickening and mild surrounding inflammation persists. No biliary ductal dilatation. No new focal liver lesions. Pancreas: Unremarkable. No pancreatic ductal dilatation or surrounding inflammatory changes. Spleen: Spleen is mildly enlarged, unchanged. Wedge-shaped area of hypoenhancement in the spleen is unchanged compatible with splenic infarct. Adrenals/Urinary Tract: Adrenal glands are unremarkable. Kidneys are normal, without renal calculi, focal lesion, or hydronephrosis. Bladder is unremarkable. Stomach/Bowel: Stomach is within normal limits. Appendix appears normal. No evidence of bowel wall thickening, distention, or inflammatory  changes. There is sigmoid colon diverticulosis without evidence for acute diverticulitis. Air-fluid levels are seen scattered throughout the colon. There is moderate gaseous distention of the stomach. Lymphatic: No enlarged lymph nodes are identified in the abdomen or pelvis. Reproductive: Prostate gland within normal limits. Other: No ascites or abdominal wall hernia. There is diffuse body wall edema. Musculoskeletal: There is no acute fracture or dislocation identified. No cortical erosions are seen. Degenerative changes affect the left knee. Chronic compression deformity of L2 is unchanged. There is intramuscular edema, swelling, fascial edema and intramuscular air in the medial compartment of the left thigh. No discrete fluid collections are identified. There is diffuse subcutaneous edema of the entire lower extremity on the left. There is also subcutaneous edema of the visualized upper portion of the right lower extremity. There is marked scrotal wall edema as well. No areas of arterial enhancement are seen within  the medial left thigh musculature. IMPRESSION: VASCULAR 1. Severe calcified atherosclerotic disease causes significant focal stenosis in the left common iliac artery, superior mesenteric artery and right renal artery. 2. No evidence for thrombosis or dissection. 3.  Aortic Atherosclerosis (ICD10-I70.0). NON-VASCULAR 1. Intramuscular and deep fat plane edema and intramuscular air in the medial compartment of the left thigh. Findings are concerning for infection/fasciitis. Necrotizing fasciitis not excluded. No evidence for abscess. No active arterial bleeding seen in this region. 2. Diffuse body wall edema and, subcutaneous edema of the left lower extremity and scrotal wall edema. 3. Scattered air-fluid levels throughout the colon can be seen in the setting of diarrhea. No bowel obstruction. 4. New percutaneous cholecystostomy tube in place. Cholelithiasis with gallbladder wall thickening and inflammation appears similar to prior examination worrisome for cholecystitis. 5. Stable splenic infarct. Electronically Signed: By: Ronney Asters M.D. On: 12/27/2021 22:40     Scheduled Meds:  (feeding supplement) PROSource Plus  30 mL Oral Daily   feeding supplement  237 mL Oral BID BM   fentaNYL  1 patch Transdermal Q72H   lactulose  20 g Oral BID   levothyroxine  50 mcg Oral Q0600   magnesium oxide  400 mg Oral BID   mouth rinse  15 mL Mouth Rinse BID   multivitamin with minerals  1 tablet Oral Daily   pantoprazole  40 mg Oral BID   QUEtiapine  25 mg Oral QHS   sodium chloride flush  5 mL Intracatheter Q8H   Continuous Infusions:  sodium chloride Stopped (12/23/21 0547)   ampicillin-sulbactam (UNASYN) IV 3 g (12/28/21 1259)    LOS: 10 days   Raiford Noble, DO [facility] Available via Epic secure chat 7am-7pm After these hours, please refer to coverage provider listed on [email or web address] 12/28/2021, 7:24 PM

## 2021-12-28 NOTE — Assessment & Plan Note (Addendum)
-  CK was improving and went from [phone] at its peak and is now 1873 -> 2149 -> 1737 -> 1270 -> 700 -> 541 -No longer getting IVF Hydration

## 2021-12-28 NOTE — Progress Notes (Signed)
Pharmacy Antibiotic Note  [name]. is a 71 y.o. male admitted on 12/18/2021 with intraabdominal infection  Pharmacy has been consulted for Unasyn dosing.   ID: sepsis, UTI vs SBP WBC 4, Tmax 99,   5/22 Vancomycin >> 5/23,  5/27 >>5/30 5/22 Cefepime >>5/24,  5/27 >>5/30 5/24 Ceftriaxone >> 5/27 5/24 flagyl>>5/30 5/30 Unasyn>>   5/22 BCx x2: neg 5/22 UCx: ngf 5/22 MRSA PCR: detected 5/24 MRSA PCR: detected 5/27: BC x 2: NGTD 5/27 Bile Cx: Enterococcus Faecalis AND Faecium, abundant lactobacillus. Pan S.  Plan: Con't Unasyn 3g IV q8hr Pharmacy will sign off. Please reconsult for further dosing assitance.    Height: 6' (182.9 cm) Weight: 109.7 kg (241 lb 13.5 oz) IBW/kg (Calculated) : 77.6  Temp (24hrs), Avg:98.1 F (36.7 C), Min:97.9 F (36.6 C), Max:98.2 F (36.8 C)  Recent Labs  Lab 12/23/21 1448 12/24/21 0309 12/25/21 0317 12/26/21 0310 12/26/21 0704 12/27/21 0411 12/27/21 1249 12/28/21 0401  WBC  --  3.9* 4.0 2.8* 3.0* 3.2* 3.9* 4.0  CREATININE  --  0.65 0.56* 0.41*  --  0.46*  --  0.48*  LATICACIDVEN 2.2*  --   --   --   --   --   --   --     Estimated Creatinine Clearance: 106.7 mL/min (A) (by C-G formula based on SCr of 0.48 mg/dL (L)).    Allergies  Allergen Reactions   Clindamycin Dermatitis    Jermel Artley S. Alford Highland, PharmD, BCPS Clinical Staff Pharmacist [email or web address]  Wayland Salinas 12/28/2021 7:15 AM

## 2021-12-28 NOTE — Progress Notes (Addendum)
Subjective:     Patient continues to report pain as mild in left thigh - he notes this only bothers him if he is positioned awkwardly in bed. No new numbness/tingling in the LLE. Patient has remained stable on RNF.  Objective:   VITALS:  Temp:  [98.1 F (36.7 C)-100.1 F (37.8 C)] 100.1 F (37.8 C) (06/01 1311) Pulse Rate:  [98-102] 101 (06/01 1311) Resp:  [18-20] 18 (06/01 1311) BP: (133-142)/(57-72) 142/72 (06/01 1311) SpO2:  [92 %-96 %] 96 % (06/01 1311) Weight:  [109.7 kg] 109.7 kg (06/01 0500)  Gen: AAOx3, NAD Comfortable at rest   Left Lower Extremity: Overall stable exam Skin intact, mild swelling No erythema or blistering anywhere in LLE No crepitus to palpation No TTP over thigh or rest of LLE Limited motor function to LLE (baseline per pt) SILT over thigh but grossly numb below knee (baseline per pt) DP, PT 2+ to palp CR < 2s   LABS Recent Labs    12/26/21 0704 12/26/21 1450 12/27/21 0411 12/27/21 1249 12/28/21 0401  HGB 6.7* 10.5* 8.2* 8.9* 8.1*  WBC 3.0*  --  3.2* 3.9* 4.0  PLT 22*  --  25* 28* 24*   Recent Labs    12/27/21 0411 12/28/21 0401  NA 137 135  K 3.6 3.7  CL 106 103  CO2 24 24  BUN 16 14  CREATININE 0.46* 0.48*  GLUCOSE 130* 160*   Recent Labs    12/26/21 0310 12/27/21 0411  INR 1.4* 1.4*     Assessment/Plan: 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 The patient has resolving pain in this left thigh and likely has myonecrosis secondary to being found down in rhabdo. The patient has also been stable clinically -imaging and clinical case reviewed with senior orthopaedic colleagues who are in agreement with plan -new CT angio and CT from 12/20/21 shows essentially stable findings in the left thigh without  interim progression on admission CT -XR of the left thigh shows no fracture or gas -as discussed with the general surgery and medical/critical care teams, the patient remains a very poor surgical candidate given his severe thrombocytopenia, liver failure, ongoing cholecystitis as well as general deconditioned state and minimal function of the LLE at baseline. If symptoms progress into florid infection and he requires surgery, it would likely be radical serial debridements of left thigh or left hip disarticulation either of which would be extremely morbid and associated with very high perioperative mortality -no surgical intervention at this time as he is clinically stable/improving and he is not a surgical candidate. We will continue to monitor clinical exam, vitals and labwork  Armond Hang 12/28/2021, 6:50 PM

## 2021-12-29 DIAGNOSIS — L270 Generalized skin eruption due to drugs and medicaments taken internally: Secondary | ICD-10-CM | POA: Diagnosis not present

## 2021-12-29 DIAGNOSIS — T360X5A Adverse effect of penicillins, initial encounter: Secondary | ICD-10-CM

## 2021-12-29 DIAGNOSIS — R932 Abnormal findings on diagnostic imaging of liver and biliary tract: Secondary | ICD-10-CM | POA: Diagnosis not present

## 2021-12-29 DIAGNOSIS — K819 Cholecystitis, unspecified: Secondary | ICD-10-CM

## 2021-12-29 DIAGNOSIS — G9341 Metabolic encephalopathy: Secondary | ICD-10-CM | POA: Diagnosis not present

## 2021-12-29 DIAGNOSIS — D62 Acute posthemorrhagic anemia: Secondary | ICD-10-CM | POA: Diagnosis not present

## 2021-12-29 DIAGNOSIS — A419 Sepsis, unspecified organism: Secondary | ICD-10-CM | POA: Diagnosis not present

## 2021-12-29 DIAGNOSIS — Z515 Encounter for palliative care: Secondary | ICD-10-CM | POA: Diagnosis not present

## 2021-12-29 DIAGNOSIS — G894 Chronic pain syndrome: Secondary | ICD-10-CM | POA: Diagnosis not present

## 2021-12-29 LAB — COMPREHENSIVE METABOLIC PANEL
ALT: 46 U/L — ABNORMAL HIGH (ref 0–44)
AST: 76 U/L — ABNORMAL HIGH (ref 15–41)
Albumin: 1.7 g/dL — ABNORMAL LOW (ref 3.5–5.0)
Alkaline Phosphatase: 51 U/L (ref 38–126)
Anion gap: 5 (ref 5–15)
BUN: 20 mg/dL (ref 8–23)
CO2: 25 mmol/L (ref 22–32)
Calcium: 7.8 mg/dL — ABNORMAL LOW (ref 8.9–10.3)
Chloride: 104 mmol/L (ref 98–111)
Creatinine, Ser: 0.55 mg/dL — ABNORMAL LOW (ref 0.61–1.24)
GFR, Estimated: >60 mL/min (ref >60)
Glucose, Bld: 156 mg/dL — ABNORMAL HIGH (ref 70–99)
Potassium: 4.9 mmol/L (ref 3.5–5.1)
Sodium: 134 mmol/L — ABNORMAL LOW (ref 135–145)
Total Bilirubin: 1.6 mg/dL — ABNORMAL HIGH (ref 0.3–1.2)
Total Protein: 4.6 g/dL — ABNORMAL LOW (ref 6.5–8.1)

## 2021-12-29 LAB — CBC WITH DIFFERENTIAL/PLATELET
Abs Immature Granulocytes: 0.08 10*3/uL — ABNORMAL HIGH (ref 0.00–0.07)
Basophils Absolute: 0 10*3/uL (ref 0.0–0.1)
Basophils Relative: 0 %
Eosinophils Absolute: 0.2 10*3/uL (ref 0.0–0.5)
Eosinophils Relative: 2 %
HCT: 26.1 % — ABNORMAL LOW (ref 39.0–52.0)
Hemoglobin: 8.4 g/dL — ABNORMAL LOW (ref 13.0–17.0)
Immature Granulocytes: 1 %
Lymphocytes Relative: 9 %
Lymphs Abs: 0.9 10*3/uL (ref 0.7–4.0)
MCH: 31.2 pg (ref 26.0–34.0)
MCHC: 32.2 g/dL (ref 30.0–36.0)
MCV: 97 fL (ref 80.0–100.0)
Monocytes Absolute: 0.4 10*3/uL (ref 0.1–1.0)
Monocytes Relative: 4 %
Neutro Abs: 7.7 10*3/uL (ref 1.7–7.7)
Neutrophils Relative %: 84 %
Platelets: 38 10*3/uL — ABNORMAL LOW (ref 150–400)
RBC: 2.69 MIL/uL — ABNORMAL LOW (ref 4.22–5.81)
RDW: 18.2 % — ABNORMAL HIGH (ref 11.5–15.5)
WBC: 9.1 10*3/uL (ref 4.0–10.5)
nRBC: 0 % (ref 0.0–0.2)

## 2021-12-29 LAB — PHOSPHORUS: Phosphorus: 4.1 mg/dL (ref 2.5–4.6)

## 2021-12-29 LAB — CK: Total CK: 1737 U/L — ABNORMAL HIGH (ref 49–397)

## 2021-12-29 LAB — MAGNESIUM: Magnesium: 1.8 mg/dL (ref 1.7–2.4)

## 2021-12-29 MED ORDER — DIPHENHYDRAMINE HCL 50 MG/ML IJ SOLN
25.0000 mg | Freq: Once | INTRAMUSCULAR | Status: AC
  Administered 2021-12-29: 25 mg via INTRAVENOUS
  Filled 2021-12-29: qty 1

## 2021-12-29 MED ORDER — FLUCONAZOLE IN SODIUM CHLORIDE 400-0.9 MG/200ML-% IV SOLN
400.0000 mg | INTRAVENOUS | Status: AC
  Administered 2021-12-29 – 2022-01-02 (×5): 400 mg via INTRAVENOUS
  Filled 2021-12-29 (×5): qty 200

## 2021-12-29 NOTE — Progress Notes (Signed)
Referring Physician(s): Ouma,E,NP  Supervising Physician: Markus Daft  Patient Status:  [facility] - In-pt  Chief Complaint: Cholecystitis s/p cholecystostomy drain 12/23/21 with Dr. Dwaine Gale   Subjective: Pt eating breakfast, has diffuse skin rash this am; fentanyl patch removed, ? source; has some mild RUQ discomfort at drain site; no N/V   Allergies: Clindamycin  Medications: Prior to Admission medications   Medication Sig Start Date End Date Taking? Authorizing Provider  ALPRAZolam Duanne Moron) 1 MG tablet Take 1 mg by mouth 3 (three) times daily.   Yes [provider]  Cholecalciferol (VITAMIN D3) 10 MCG (400 UNIT) CAPS Take 4 capsules by mouth daily.   Yes [provider]  folic acid (FOLVITE) 1 MG tablet TAKE 1 TABLET(1 MG) BY MOUTH DAILY Patient taking differently: Take 1 mg by mouth daily. 10/12/21  Yes Burns, Wandra Feinstein, NP  hydrochlorothiazide (HYDRODIURIL) 12.5 MG tablet TAKE 1 TABLET(12.5 MG) BY MOUTH DAILY Patient taking differently: Take 12.5 mg by mouth daily. 10/27/21  Yes [name], FNP  levothyroxine (SYNTHROID) 50 MCG tablet TAKE 1 TABLET(50 MCG) BY MOUTH DAILY BEFORE BREAKFAST Patient taking differently: Take 50 mcg by mouth daily before breakfast. 12/14/21  Yes Arnett, [name], FNP  losartan (COZAAR) 100 MG tablet TAKE 1 TABLET(100 MG) BY MOUTH DAILY Patient taking differently: Take 100 mg by mouth daily. 11/14/21  Yes Arnett, [name], FNP  lubiprostone (AMITIZA) 24 MCG capsule Take 24 mcg by mouth daily as needed. 08/13/14  Yes [provider]  meloxicam (MOBIC) 15 MG tablet Take 15 mg by mouth every morning. 01/12/21  Yes [provider]  morphine (MS CONTIN) 15 MG 12 hr tablet Take 15 mg by mouth 2 (two) times daily as needed for pain. 04/06/20  Yes [provider]  Oxycodone HCl 20 MG TABS Take 20 mg by mouth 4 (four) times daily. 12/04/21  Yes [provider]  pantoprazole (PROTONIX) 40 MG tablet Take 1 tablet  (40 mg total) by mouth daily. 12/18/20 12/18/21 Yes Sharen Hones, MD  vitamin B-12 (CYANOCOBALAMIN) 1000 MCG tablet Take 1 tablet (1,000 mcg total) by mouth daily. 04/09/18  Yes Bettey Costa, MD  NARCAN 4 MG/0.1ML LIQD nasal spray kit Place 1 spray into the nose once. 12/29/18   [provider]     Vital Signs: BP 103/64 (BP Location: Left Arm)   Pulse (!) 110   Temp 98.6 F (37 C) (Oral)   Resp 15   Ht 6' (1.829 m)   Wt 242 lb 1 oz (109.8 kg)   SpO2 92%   BMI 32.83 kg/m   Physical Exam awake/alert; GB drain intact, insertion site okay, mildly tender, output 270 cc of dark bilious fluid, drain flushed without difficulty   Imaging: CT ANGIO AO+BIFEM W & OR WO CONTRAST  Addendum Date: 12/27/2021   ADDENDUM REPORT: 12/27/2021 23:09 ADDENDUM: These results were called by telephone at the time of interpretation on 12/27/2021 at 11:07 pm to provider Harold Barban , who verbally acknowledged these results. Electronically Signed   By: Ronney Asters M.D.   On: 12/27/2021 23:09   Result Date: 12/27/2021 CLINICAL DATA:  Lower extremity vascular trauma. EXAM: CT ANGIOGRAPHY OF ABDOMINAL AORTA WITH ILIOFEMORAL RUNOFF TECHNIQUE: Multidetector CT imaging of the abdomen, pelvis and left lower extremity was performed using the standard protocol during bolus administration of intravenous contrast. Multiplanar CT image reconstructions and MIPs were obtained to evaluate the vascular anatomy. RADIATION DOSE REDUCTION: This exam was performed according to the departmental  dose-optimization program which includes automated exposure control, adjustment of the mA and/or kV according to patient size and/or use of iterative reconstruction technique. CONTRAST:  152m OMNIPAQUE IOHEXOL 350 MG/ML SOLN COMPARISON:  Left femur x-ray 10/23/2021. CT abdomen and pelvis 12/22/2021. FINDINGS: VASCULAR Aorta: Normal caliber aorta without aneurysm, dissection, vasculitis or significant stenosis. There is severe  atherosclerotic calcifications of the aorta. Celiac: There is moderate focal stenosis at the origin of the celiac artery secondary to calcified atherosclerotic disease. Vessels are otherwise patent. SMA: There is severe stenosis in the proximal superior mesenteric artery secondary to calcified atherosclerotic disease measuring 15 mm in length. Vessels otherwise patent. Renals: Left renal artery appears patent. There is a questionable stent at its origin. There is severe stenosis at the origin of the right renal artery secondary to calcified atherosclerotic disease. The vessels are grossly patent distally. IMA: Patent without evidence of aneurysm, dissection, vasculitis or significant stenosis. LEFT Lower Extremity Inflow: There is calcified atherosclerotic disease in the left common iliac artery. Left common iliac artery and left external iliac artery appear patent without focal stenosis or dissection. Outflow: There is severe focal stenosis in the left common iliac artery proximally secondary to calcified atherosclerotic disease. Flow is seen distally within the left common femoral artery. Flow is also identified within the superficial and profunda femoral arteries, although some calcified plaque is present. There is some mild areas of stenosis. No severe areas of stenosis, dissection or thrombosis identified. Popliteal artery patent. Runoff: Patent three vessel runoff to the ankle. Veins: No obvious venous abnormality within the limitations of this arterial phase study. Review of the MIP images confirms the above findings. NON-VASCULAR Lower chest: There is bibasilar atelectasis. Hepatobiliary: There is a new percutaneous cholecystostomy tube in place. Gallstones are again seen. Air in the gallbladder is again seen. Gallbladder wall thickening and mild surrounding inflammation persists. No biliary ductal dilatation. No new focal liver lesions. Pancreas: Unremarkable. No pancreatic ductal dilatation or surrounding  inflammatory changes. Spleen: Spleen is mildly enlarged, unchanged. Wedge-shaped area of hypoenhancement in the spleen is unchanged compatible with splenic infarct. Adrenals/Urinary Tract: Adrenal glands are unremarkable. Kidneys are normal, without renal calculi, focal lesion, or hydronephrosis. Bladder is unremarkable. Stomach/Bowel: Stomach is within normal limits. Appendix appears normal. No evidence of bowel wall thickening, distention, or inflammatory changes. There is sigmoid colon diverticulosis without evidence for acute diverticulitis. Air-fluid levels are seen scattered throughout the colon. There is moderate gaseous distention of the stomach. Lymphatic: No enlarged lymph nodes are identified in the abdomen or pelvis. Reproductive: Prostate gland within normal limits. Other: No ascites or abdominal wall hernia. There is diffuse body wall edema. Musculoskeletal: There is no acute fracture or dislocation identified. No cortical erosions are seen. Degenerative changes affect the left knee. Chronic compression deformity of L2 is unchanged. There is intramuscular edema, swelling, fascial edema and intramuscular air in the medial compartment of the left thigh. No discrete fluid collections are identified. There is diffuse subcutaneous edema of the entire lower extremity on the left. There is also subcutaneous edema of the visualized upper portion of the right lower extremity. There is marked scrotal wall edema as well. No areas of arterial enhancement are seen within the medial left thigh musculature. IMPRESSION: VASCULAR 1. Severe calcified atherosclerotic disease causes significant focal stenosis in the left common iliac artery, superior mesenteric artery and right renal artery. 2. No evidence for thrombosis or dissection. 3.  Aortic Atherosclerosis (ICD10-I70.0). NON-VASCULAR 1. Intramuscular and deep fat plane edema and intramuscular air  in the medial compartment of the left thigh. Findings are concerning  for infection/fasciitis. Necrotizing fasciitis not excluded. No evidence for abscess. No active arterial bleeding seen in this region. 2. Diffuse body wall edema and, subcutaneous edema of the left lower extremity and scrotal wall edema. 3. Scattered air-fluid levels throughout the colon can be seen in the setting of diarrhea. No bowel obstruction. 4. New percutaneous cholecystostomy tube in place. Cholelithiasis with gallbladder wall thickening and inflammation appears similar to prior examination worrisome for cholecystitis. 5. Stable splenic infarct. Electronically Signed: By: Ronney Asters M.D. On: 12/27/2021 22:40    Labs:  CBC: Recent Labs    12/27/21 0411 12/27/21 1249 12/28/21 0401 12/29/21 0357  WBC 3.2* 3.9* 4.0 9.1  HGB 8.2* 8.9* 8.1* 8.4*  HCT 25.2* 28.6* 25.2* 26.1*  PLT 25* 28* 24* 38*    COAGS: Recent Labs    12/18/21 1124 12/21/21 0244 12/24/21 0309 12/25/21 0317 12/26/21 0310 12/27/21 0411  INR 1.8*   < > 1.3* 1.4* 1.4* 1.4*  APTT 32  --   --   --   --   --    < > = values in this interval not displayed.    BMP: Recent Labs    12/26/21 0310 12/27/21 0411 12/28/21 0401 12/29/21 0357  NA 134* 137 135 134*  K 3.6 3.6 3.7 4.9  CL 104 106 103 104  CO2 _0 GLUCOSE 118* 130* 160* 156*  BUN _1 CALCIUM 7.5* 7.8* 7.7* 7.8*  CREATININE 0.41* 0.46* 0.48* 0.55*  GFRNONAA >60 >60 >60 >60    LIVER FUNCTION TESTS: Recent Labs    12/26/21 0310 12/27/21 0411 12/28/21 0401 12/29/21 0357  BILITOT 1.3* 1.1 0.9 1.6*  AST 111* 86* 83* 76*  ALT 89* 70* 57* 46*  ALKPHOS 47 55 58 51  PROT 4.7* 4.8* 4.8* 4.6*  ALBUMIN 1.8* 1.8* 1.8* 1.7*    Assessment and Plan: Patient with history of acute cholecystitis, recent sepsis, alcoholic cirrhosis, splenic infarction, peripheral vascular disease, left thigh myonecrosis/rhabdomyolysis; status post percutaneous cholecystostomy on 5/27(10 fr to bag); afebrile, WBC normal, hemoglobin 8.4 up from 8.1,  platelets 38k, creatinine 0.55, total bilirubin 1.6, latest blood cultures pending, bile cultures with Enterococcus; continue drain irrigation; drain will need to remain in place at least 4- 6 weeks to allow for tract maturation before considering removal; can obtain follow-up cholangiogram in 4 to 6 weeks or sooner if complications arise.   Electronically Signed: D. Rowe Robert, PA-C 12/29/2021, 10:23 AM   I spent a total of 15 Minutes at the the patient's bedside AND on the patient's hospital floor or unit, greater than 50% of which was counseling/coordinating care for gallbladder drain    Patient ID: [name]., male   DOB: Dec 01, 1950, 71 y.o.   MRN: [id number]

## 2021-12-29 NOTE — Consult Note (Addendum)
[facility] for Infectious Disease    Date of Admission:  12/18/2021     Reason for Consult: fever    Referring Provider: Alfredia Ferguson      Abx: 6/2-c fluconazole 400 mg iv daily 5/30-c amp-sulb  5/22; 5/27-30 vanc 5/24-27 5/22-24; 5/27-30 cefepime 5/24-30 metronidazole         Assessment: Rash -- concerning for drug rash Cholecystitis s/p IR perc cholecystotomy 5/27; general surgery referral to [facility] for outpatient f/u Septic/hypovolumic shock resolved Hx hep c Left thigh myonecrosis/rhabdo; ortho evaluated suspect pressure related (found down) Alcoholic cirrhosis "Splenic infarct"; stable on ct scan; surgery evaluated -- expectant management pvd Full code status  71 y.o. male alcoholic cirrhosis, pancytopenia followed by heme, hx right aka, hx hep c cleared, bib EMS after his [facility] aide found him on ground, found to have LLE/thigh rhabdo-myonecrosis, cholecystitis due to stone, now with recurrent fever and a rash while on abx  6/02 bcx in progress 5/27 bcx negative 5/27 perc chole cystotomy cx e faecalis (S amp), e faecium (S amp); c albican, lactobacillus species 5/22 bcx negative 5/18 bcx negative  General surgery awared of splenic infarct and ortho awared left thigh rhabdo. Expectant management. Given multiple negative bcx I am not as pressed to get tee. Transthoracic echo was without sign of aortic/mitral valve vegetation  Recurrent fever on 6/01; stable hemodynamics otherwise. No other localizing infectious syndrome currently. The perc drain is draining well. No sign of pna/gu infection. He has a new rash since 6/1 maculopapular erythematous rash on trunk  which appears like drug rash. He has been on amp-sulb so maybe that. The fluconazole was started after rash appears. This is probably not due to fentanyl patch   The fever might be related but will see what culture show. His cholecystotomy shouldn't be an issue. He has had 5 days amp-sulb which we can  stop as he has the drain. We'll need to continue fluconazole   He has stable left thigh rhabdo without sign of infection there so likely not contributing to fever   Other things to think about given his cirrhosis is to sample his ascites and check for sbp as well  Plan: Stop amp-sulb Continue fluconazole for candida in the drain cx until 6/7 Rash might get worse but should stabilize early next week. If no sign of TEN/SJS or dress could dc if sepsis w/u negative Consider r/o SBP given his cirrhosis status F/u blood culture He'll need outpatient f/u with surgery Discussed with primary team     I spent 75 minute reviewing data/chart, and coordinating care and >50% direct face to face time providing counseling/discussing diagnostics/treatment plan with patient      ------------------------------------------------ Principal Problem:   Septic shock ([place]) Active Problems:   Anxiety   Hemolytic anemia (HCC)   Chronic pain syndrome   Goals of care, counseling/discussion   Physical deconditioning   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   Thrombocytopenia ([place])   AKI (acute kidney injury) ([place])   Acute metabolic encephalopathy   Elevated liver enzymes   Hyperbilirubinemia   Traumatic rhabdomyolysis (HCC)   Hyperkalemia, hyponatremia and hypomagnesemia   Hyperkalemia   Hyponatremia   Left leg weakness and numbness   Calculus of bile duct with acute cholecystitis   Elevated troponin   SVT (supraventricular tachycardia) (HCC)   Acute pain of left thigh/rhabdomyolysis/myonecrosis   Splenic infarction  GI bleed   Scrotal edema    HPI: Donne Anon Mathison [name]. is a 71 y.o. male alcoholic cirrhosis, pancytopenia followed by heme, hx right aka, hx hep c cleared, bib EMS after his [facility] aide found him on ground, found to have LLE/thigh rhabdo-myonecrosis, cholecystitis due to stone, now with recurrent fever and a rash  while on abx   Chart reviewed Spoke with patient Abd pelv ct showed cholecystitis with air in the gall bladder Ct also showed signs of rhabdo/myonecrosis left thigh Started on bsAbx He had elevated lft which has been improvign since ir placed perc drain in gall bladder on 5/27 -- cx with candida along with enterococcus/lactobacillus Several bcx since admission negative  He was on the way to discharge when fever recurs on 6/01 He has no picc or orthopedic/cardiac device  He feels well outside of generalized weakness  Palliative care seen but at this time family wants full code  The only new correlated with his fever is the rash on his lower trunk upper bilateral LE since 6/01  He has been on amp-sulb; fluconazole started 6/02 No cough, joint pain Left thigh pain better Perc drain draining well     Family History  Problem Relation Age of Onset   Heart disease Mother    Lung cancer Mother 4   Heart attack Paternal Grandmother    Heart attack Paternal Grandfather    Liver cancer Neg Hx     Social History   Tobacco Use   Smoking status: Never   Smokeless tobacco: Never  Substance Use Topics   Alcohol use: Not Currently    Alcohol/week: 0.0 standard drinks   Drug use: No    Allergies  Allergen Reactions   Clindamycin Dermatitis    Review of Systems: ROS All Other ROS was negative, except mentioned above   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   Squamous cell carcinoma of skin 05/21/2018   R mid volar  forearm   Thyroid disease    UROLITHIASIS, HX OF 12/23/2007       Scheduled Meds:  (feeding supplement) PROSource Plus  30 mL Oral Daily   feeding supplement  237 mL Oral BID BM   fentaNYL  1 patch Transdermal Q72H   lactulose  20 g Oral BID   levothyroxine  50 mcg Oral Q0600   magnesium oxide  400 mg Oral BID   mouth rinse  15 mL Mouth Rinse BID   multivitamin with minerals  1 tablet Oral Daily   pantoprazole  40 mg Oral BID   QUEtiapine  25 mg Oral QHS   sodium chloride flush  5 mL Intracatheter Q8H   Continuous Infusions:  sodium chloride Stopped (12/23/21 0547)   ampicillin-sulbactam (UNASYN) IV 3 g (12/29/21 0807)   fluconazole (DIFLUCAN) IV 400 mg (12/29/21 0954)   PRN Meds:.sodium chloride, acetaminophen, ALPRAZolam, docusate, haloperidol lactate, metoprolol tartrate, oxyCODONE, polyethylene glycol   OBJECTIVE: Blood pressure 103/64, pulse (!) 110, temperature 98.6  F (37 C), temperature source Oral, resp. rate 15, height 6' (1.829 m), weight 109.8 kg, SpO2 92 %.  Physical Exam  General/constitutional: no distress, pleasant, conversant, not confused; anasarca HEENT: Normocephalic, PER, Conj Clear, EOMI, Oropharynx clear Neck supple CV: rrr no mrg Lungs: clear to auscultation, normal respiratory effort Abd: Soft, Nontender Ext/msk; s/p right aka distant past. Edema LLE 2/4; nontender thigh; edema 1/4 bilateral upper ext Skin: erythematous maculopapular rash on lower trunk and upper parts bilateral LE Gu: scrotal edema Neuro: generalized weakness; LLE weak >> bilateral Upper ext   6/2 pictures       Lab Results Lab Results  Component Value Date   WBC 9.1 12/29/2021   HGB 8.4 (L) 12/29/2021   HCT 26.1 (L) 12/29/2021   MCV 97.0 12/29/2021   PLT 38 (L) 12/29/2021    Lab Results  Component Value Date   CREATININE 0.55 (L) 12/29/2021   BUN 20 12/29/2021   NA 134 (L) 12/29/2021   K 4.9 12/29/2021   CL 104 12/29/2021   CO2 25 12/29/2021    Lab  Results  Component Value Date   ALT 46 (H) 12/29/2021   AST 76 (H) 12/29/2021   ALKPHOS 51 12/29/2021   BILITOT 1.6 (H) 12/29/2021      Microbiology: Recent Results (from the past 240 hour(s))  MRSA Next Gen by PCR, Nasal     Status: Abnormal   Collection Time: 12/20/21  9:40 AM   Specimen: Nasal Mucosa; Nasal Swab  Result Value Ref Range Status   MRSA by PCR Next Gen DETECTED (A) NOT DETECTED Final    Comment: (NOTE) The GeneXpert MRSA Assay (FDA approved for NASAL specimens only), is one component of a comprehensive MRSA colonization surveillance program. It is not intended to diagnose MRSA infection nor to guide or monitor treatment for MRSA infections. Test performance is not FDA approved in patients less than 40 years old. Performed at [facility], [place] [address] [place], [place] [address]   Aerobic/Anaerobic Culture w Gram Stain (surgical/deep wound)     Status: None   Collection Time: 12/23/21 11:58 AM   Specimen: BILE  Result Value Ref Range Status   Specimen Description   Final    BILE Performed at [facility] [address], [place], [place] [address]    Special Requests   Final    NONE Performed at [facility], [place] [address] [place], [place] [address]    Gram Stain   Final    ABUNDANT WBC PRESENT, PREDOMINANTLY MONONUCLEAR MODERATE GRAM POSITIVE COCCI IN PAIRS Performed at [facility], [place] [address], [place], [place] [address]    Culture   Final    MODERATE ENTEROCOCCUS FAECALIS MODERATE ENTEROCOCCUS FAECIUM ABUNDANT LACTOBACILLUS SPECIES Standardized susceptibility testing for this organism is not available. FEW CANDIDA ALBICANS NO ANAEROBES ISOLATED; CULTURE IN PROGRESS FOR 5 DAYS    Report Status 12/28/2021 FINAL  Final   Organism ID, Bacteria ENTEROCOCCUS FAECALIS  Final   Organism ID, Bacteria ENTEROCOCCUS FAECIUM  Final      Susceptibility   Enterococcus faecalis - MIC*     AMPICILLIN <=2 SENSITIVE Sensitive     VANCOMYCIN 2 SENSITIVE Sensitive     GENTAMICIN SYNERGY SENSITIVE Sensitive     * MODERATE ENTEROCOCCUS FAECALIS   Enterococcus faecium - MIC*    AMPICILLIN <=2 SENSITIVE Sensitive     VANCOMYCIN <=0.5 SENSITIVE Sensitive     GENTAMICIN SYNERGY SENSITIVE Sensitive     *  MODERATE ENTEROCOCCUS FAECIUM  Culture, blood (Routine X 2) w Reflex to ID Panel     Status: None   Collection Time: 12/23/21  2:48 PM   Specimen: BLOOD  Result Value Ref Range Status   Specimen Description   Final    BLOOD LEFT ANTECUBITAL Performed at [facility], [place] [address] [place], [place] [address]    Special Requests   Final    NONE Performed at [facility], [place] [address] [place], [place] [address]    Culture   Final    NO GROWTH 5 DAYS Performed at [facility], [place] [address], [place], [place] [address]    Report Status 12/28/2021 FINAL  Final  Culture, blood (Routine X 2) w Reflex to ID Panel     Status: None   Collection Time: 12/23/21  2:48 PM   Specimen: BLOOD  Result Value Ref Range Status   Specimen Description   Final    BLOOD RIGHT ANTECUBITAL Performed at [facility], [place] [address] [place], [place] [address]    Special Requests   Final    NONE Performed at [facility], [place] [address] [place], [place] [address]    Culture   Final    NO GROWTH 5 DAYS Performed at [facility], [place] [address] [place], [place] [address]    Report Status 12/28/2021 FINAL  Final     Serology:    Imaging: If present, new imagings (plain films, ct scans, and mri) have been personally visualized and interpreted; radiology reports have been reviewed. Decision making incorporated into the Impression / Recommendations.  12/27/21 ct angio bifem with runoff VASCULAR   1. Severe calcified atherosclerotic disease causes significant focal stenosis in the left common iliac artery, superior  mesenteric artery and right renal artery. 2. No evidence for thrombosis or dissection. 3.  Aortic Atherosclerosis (ICD10-I70.0).   NON-VASCULAR   1. Intramuscular and deep fat plane edema and intramuscular air in the medial compartment of the left thigh. Findings are concerning for infection/fasciitis. Necrotizing fasciitis not excluded. No evidence for abscess. No active arterial bleeding seen in this region. 2. Diffuse body wall edema and, subcutaneous edema of the left lower extremity and scrotal wall edema. 3. Scattered air-fluid levels throughout the colon can be seen in the setting of diarrhea. No bowel obstruction. 4. New percutaneous cholecystostomy tube in place. Cholelithiasis with gallbladder wall thickening and inflammation appears similar to prior examination worrisome for cholecystitis. 5. Stable splenic infarct.   5/27 cxr No evidence acute cardiopulm disease   5/26 ct abd/pelv 1. Imaging findings compatible with acute cholecystitis. Gas is now identified within the lumen of the gallbladder. Diagnosis of exclusion would be acute emphysematous cholecystitis. Alternatively, presence of gas within the gallbladder lumen may reflect cholecystocolonic fistula although there is no patent contrast opacifying fistula identified on the current exam (with sufficient contrast material noted within the adjacent inflamed loop of colon). 2. Acute splenic infarct. 3. Marked asymmetric swelling of the medial musculature of the left thigh with gas tracking throughout the muscle with surrounding fat stranding. Cannot exclude necrotizing fasciitis. 4. Increase caliber of the duodenum within the left hemiabdomen favoring focal ileus. 5. Cirrhosis. No mass identified within the liver to suggest underlying malignancy. Certainly there is no lesion within the liver which should impact the patient's current acute clinical situation. Ideally, in a patient that is at increased risk for  hepatocellular carcinoma the study of choice  for screening [place] would be an outpatient, multiphase MRI. This should be performed only when the patient is able to remain motionless and participate in breath hold technique. Alternatively, outpatient multiphase liver CT would be recommended. 6. Aortic Atherosclerosis  5/23 tte  1. Left ventricular ejection fraction, by estimation, is 55 to 60%. The  left ventricle has normal function. The left ventricle demonstrates  regional wall motion abnormalities (basal septal hypokinesis). There is  moderate concentric left ventricular  hypertrophy. Left ventricular diastolic parameters are consistent with  Grade I diastolic dysfunction (impaired relaxation). Qualitatively LV  appears slightly underfilled.   2. Right ventricular systolic function is normal. The right ventricular  size is normal. Tricuspid regurgitation signal is inadequate for assessing  PA pressure.   3. The mitral valve is normal in structure. No evidence of mitral valve  regurgitation. No evidence of mitral stenosis.   4. The aortic valve was not well visualized. There is mild calcification  of the aortic valve. Aortic valve regurgitation is not visualized.   5. The inferior vena cava is normal in size with greater than 50%  respiratory variability, suggesting right atrial pressure of 3 mmHg.    Jabier Mutton, [facility] for Infectious [place] [phone] pager    12/29/2021, 10:37 AM

## 2021-12-29 NOTE — Progress Notes (Signed)
Daily Progress Note   Patient Name: [name].       Date: 12/29/2021 DOB: 02/03/1951  Age: 71 y.o. MRN#: [id number] Attending Physician: Kerney Elbe, DO Primary Care Physician: [name], FNP Admit Date: 12/18/2021  Reason for Consultation/Follow-up: Establishing goals of care  Subjective: Discussed with Dr. Alfredia Ferguson.    I met today with Mr. Weinman and his son [name].    We discussed pain regimen in detail as he identifies this as his primary concern and obstacle to participating in therapy.  Discussed long and short acting medications and discussed options for pain management.    We reviewed clinical course and discussed goals of care/advance care planning.  See below.  Length of Stay: 11  Current Medications: Scheduled Meds:   (feeding supplement) PROSource Plus  30 mL Oral Daily   feeding supplement  237 mL Oral BID BM   fentaNYL  1 patch Transdermal Q72H   lactulose  20 g Oral BID   levothyroxine  50 mcg Oral Q0600   magnesium oxide  400 mg Oral BID   mouth rinse  15 mL Mouth Rinse BID   multivitamin with minerals  1 tablet Oral Daily   pantoprazole  40 mg Oral BID   QUEtiapine  25 mg Oral QHS   sodium chloride flush  5 mL Intracatheter Q8H    Continuous Infusions:  sodium chloride Stopped (12/23/21 0547)   ampicillin-sulbactam (UNASYN) IV 3 g (12/29/21 0156)    PRN Meds: sodium chloride, acetaminophen, ALPRAZolam, docusate, haloperidol lactate, metoprolol tartrate, oxyCODONE, polyethylene glycol  Physical Exam         Wake alert oriented resting in bed Appears chronically ill Right AKA No focal deficits Has cholecystostomy drain Regular work of breathing  Vital Signs: BP 103/64 (BP Location: Left Arm)   Pulse (!) 110   Temp 98.6 F (37  C) (Oral)   Resp 15   Ht 6' (1.829 m)   Wt 109.8 kg   SpO2 92%   BMI 32.83 kg/m  SpO2: SpO2: 92 % O2 Device: O2 Device: Room Air O2 Flow Rate: O2 Flow Rate (L/min): 2 L/min  Intake/output summary:  Intake/Output Summary (Last 24 hours) at 12/29/2021 0805 Last data filed at 12/29/2021 0622 Gross per 24 hour  Intake 2715 ml  Output 1420 ml  Net 1295 ml    LBM: Last BM Date : 12/27/21 Baseline Weight: Weight: 88.8 kg Most recent weight: Weight: 109.8 kg       Palliative Assessment/Data:    Flowsheet Rows    Flowsheet Row Most Recent Value  Intake Tab   Referral Department Hospitalist  Unit at Time of Referral Med/Surg Unit  Palliative Care Primary Diagnosis Sepsis/Infectious Disease  Date Notified 12/28/21  Clinical Assessment   Psychosocial & Spiritual Assessment   Palliative Care Outcomes        Patient Active Problem List   Diagnosis Date Noted   Acute pain of left thigh/rhabdomyolysis/myonecrosis 12/23/2021   Septic shock ([place]) 12/18/2021   SVT (supraventricular tachycardia) ([place]) 12/22/2021   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 08/07/3233   AKI (acute kidney injury) ([place]) 57/32/2025   Acute metabolic encephalopathy 42/70/6237   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    Disorder of skeletal system 02/29/2020   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   Left leg weakness and numbness 12/21/2021   Thrombocytopenia ([place]) 12/20/2021   Hyperkalemia, hyponatremia and hypomagnesemia 12/20/2021   Goals of care, counseling/discussion 02/29/2020   Physical deconditioning 02/29/2020   Scrotal edema 12/23/2021   GI bleed 12/23/2021   Calculus of bile duct with acute cholecystitis 12/21/2021   Elevated troponin 12/21/2021   Elevated liver enzymes 12/20/2021   Hyperbilirubinemia 12/20/2021   Traumatic rhabdomyolysis ([place]) 12/20/2021   Hyperkalemia 12/20/2021   Hyponatremia 12/20/2021   Hypomagnesemia 12/16/2020   Gastric nodule    Duodenal  erythema    Polyp of colon    Left leg swelling 12/14/2020   Eye trauma 02/29/2020   History of pancytopenia 02/29/2020   Portal venous hypertension ([place]) 02/29/2020   S/P AKA (above knee amputation) unilateral ([place]) 02/29/2020   Hepatic cirrhosis due to chronic hepatitis C infection ([place]) 02/29/2020   HTN (hypertension) 09/14/2019   Hepatic encephalopathy ([place]) 04/07/2018   Osteoarthritis of left knee 02/26/2018   Chronic pain syndrome 11/23/2016   Hypothyroidism 09/25/2016   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   Hepatitis B core antibody positive 05/22/2016   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[place]) 04/14/2015   Cellulitis of right thigh 05/21/2012   Pancytopenia ([place]) 02/26/2012   Encounter for long-term (current) use of other medications 02/26/2012   Screening for prostate cancer 02/26/2012   Hemolytic anemia ([place]) 02/26/2012   Closed fracture of carpal bone 10/12/2009   Dyslipidemia 09/12/2009   Phantom limb syndrome ([place]) 09/12/2009   Hypocalcemia 12/23/2007   ERECTILE DYSFUNCTION, 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  Discussed regimen with him and made recommendation for adding long acting agent.  He reports that he does not do well with oxycontin.  He is prescribed MS Contin as outpatient but will need to avoid this with liver issues.  Plan for initiation of fentanyl patch at 25 mcg/hr and will continue oxycodone as needed for breakthrough pain. Discussed goals and advance care planning.  He remains Full Code/Full Scope.  Introduced MOST form and discussed how to develop plan of care to focus on continuing therapies that would maximize chance of being well enough to return home and limiting therapies not in line with this goal.  His son, JBarnabas Listeris his HCPOA.  I gave him a copy of [facility]  [employer] for [employer]  to review.  Recommend outpatient follow-up.    Code Status:    Code Status Orders  (From admission, onward)           Start     Ordered   12/18/21 1422  Full code  Continuous        12/18/21 1421           Code Status History     Date Active Date Inactive Code Status Order ID Comments User Context   12/14/2020 1507 12/18/2020 2106 Full Code [id number]  Artist Beach, MD ED   04/07/2018 2206 04/11/2018 1552 Full Code [id number]  [name], MD Inpatient      Advance Directive Documentation    Flowsheet Row Most Recent Value  Type of Advance Directive Healthcare Power of Attorney  Pre-existing out of facility DNR order (yellow form or pink MOST form) --  "MOST" Form in Place? --       Prognosis:  Guarded   Discharge Planning: [facility] for rehab with Palliative care service follow-up  Care plan was discussed with patient, son and brother.   Thank you for allowing the Palliative Medicine Team to assist in the care of this patient.  Total time: 54 minutes  Micheline Rough, MD  Please contact Palliative Medicine Team phone at [phone] for questions and concerns.

## 2021-12-29 NOTE — Progress Notes (Signed)
Physical Therapy Treatment Patient Details Name: [name]. MRN: [id number] DOB: 12/11/1950 Today's Date: 12/29/2021   History of Present Illness 71 year-old M with PMH of EtOH cirrhosis, nonimmune hemolytic anemia, pancytopenia, right AKA and hepatitis C brought to ED 12/18/21 with altered mental status after he slid off his bed and found down on the ground.. Patient  admitted to ICU with hypovolemic shock, hemolytic anemia, liver failure, encephalopathy, AKI and hyperkalemia.    PT Comments    General Comments: AxO x 2 appears to be unaware of his current true medical status.  Required MAX encouragement. Following all commands.  Was living home alone with family that would "come by and check on me". Pt has been non ambulatory for years and uses a power scooter.   General bed mobility comments: required increased asisst due to weakness, edema throughout esp scrotum and enlarge ABD girth.  HOB elevated and + 2 Total Assist using bed pad to complete swival/scoot.  Unable to fully sit upright due to scrotal pain/edema and L hip pain/pressure upond sitting.  Only tolerated EOB x 2 min with MAX encouragement.  "I got to lay back down".  Distracted pt with conversation and stall tatics to "check his skin" on his backside.  Getting back to bed was difficult.  Required + 2 Total Assist to scoot back and swival to center of bed with c/o scrotal pain.  Assisted with rolling side to side to straighten the bed pads and elevated scrotum with towels as placed prior.  L LE remains externally rotated with knee flexed.  Pt was unable to achieve knee extension. Pt will need ST Rehab at SNF.   Recommendations for follow up therapy are one component of a multi-disciplinary discharge planning process, led by the attending physician.  Recommendations may be updated based on patient status, additional functional criteria and insurance authorization.  Follow Up Recommendations   SNF                        Precautions / Restrictions Precautions Precautions: Fall Precaution Comments: R AKA, profound  weakness  left  hip, knee, demonstrates no ankle dorsi/plantar flex,  lacks P/AROM Knee extension appearing contracted;  per patient, contracture PTA(WC dep)also Restrictions Weight Bearing Restrictions: No Other Position/Activity Restrictions: will not be able to stand on Left leg     Mobility  Bed Mobility Overal bed mobility: Needs Assistance Bed Mobility: Supine to Sit, Sit to Sidelying, Rolling Rolling: Mod assist, +2 for safety/equipment, +2 for physical assistance   Supine to sit: Total assist, +2 for physical assistance, +2 for safety/equipment, HOB elevated Sit to supine: Total assist, +2 for physical assistance, +2 for safety/equipment, HOB elevated   General bed mobility comments: required increased asisst due to weakness, edema throughout esp scrotum and enlarge ABD girth.  HOB elevated and + 2 Total Assist using bed pad to complete swival/scoot.  Unable to fully sit upright due to scrotal pain/edema and L hip pain/pressure upond sitting.  Only tolerated EOB x 2 min with MAX encouragement.  "I got to lay back down".  Distracted pt with conversation and stall tatics to "check his skin" on his backside.  Getting back to bed was difficult.  Required + 2 Total Assist to scoot back and swival to center of bed with c/o scrotal pain.  Assisted with rolling side to side to straighten the bed pads and elevated scrotum with towels as placed prior.  L LE remains externally rotated  with knee flexed.  Pt was unable to achieve knee extension.    Transfers                   General transfer comment: rec MAXI MOVE/SKY LIFT for any OOB    Ambulation/Gait               General Gait Details: non ambulatory PTA                                              Cognition Arousal/Alertness: Awake/alert   Overall Cognitive Status: No family/caregiver present to  determine baseline cognitive functioning                                 General Comments: AxO x 2 appears to be unaware of his current true medical status.  Required MAX encouragement. Following all commands.  Was living home alone with family that would "come by and check on me".                   Pertinent Vitals/Pain Pain Assessment Pain Assessment: Faces Faces Pain Scale: Hurts whole lot Pain Location: scrotum, L hip, L knee, back, neck Pain Descriptors / Indicators: Discomfort, Aching, Guarding Pain Intervention(s): Monitored during session                     Time: 9826-4158 PT Time Calculation (min) (ACUTE ONLY): 16 min  Charges:  $Therapeutic Activity: 8-22 mins                     {Antaniya Venuti  PTA Acute  Rehabilitation Services Pager      [phone] Office      [phone]

## 2021-12-29 NOTE — Progress Notes (Signed)
Occupational Therapy Treatment Patient Details Name: [name]. MRN: [id number] DOB: 07/24/1951 Today's Date: 12/29/2021   History of present illness 71 year-old M with PMH of EtOH cirrhosis, nonimmune hemolytic anemia, pancytopenia, right AKA and hepatitis C brought to ED 12/18/21 with altered mental status after he slid off his bed and found down on the ground.. Patient  admitted to ICU with hypovolemic shock, hemolytic anemia, liver failure, encephalopathy, AKI and hyperkalemia.   OT comments  Very limited treatment today due to patient reporting fatigue. He also reports "they told me I'm dying." He had a fever last night and now is covered in a rash. He was able to do a bed level task and continues to exhibit good upper body strength though he refused exercises. Therapist had brought theraband for exercise program. Therapist educated and encouraged patient on needing to maintain strength and work on moving in bed but could not be persuaded. Will continue POC.    Recommendations for follow up therapy are one component of a multi-disciplinary discharge planning process, led by the attending physician.  Recommendations may be updated based on patient status, additional functional criteria and insurance authorization.    Follow Up Recommendations  Skilled nursing-short term rehab (<3 hours/day)    Assistance Recommended at Discharge Frequent or constant Supervision/Assistance  Patient can return home with the following  Two people to help with walking and/or transfers;A lot of help with bathing/dressing/bathroom;Assistance with cooking/housework;Help with stairs or ramp for entrance;Direct supervision/assist for medications management;Direct supervision/assist for financial management;Assist for transportation   Equipment Recommendations  None recommended by OT    Recommendations for Other Services      Precautions / Restrictions Precautions Precautions: Fall Precaution Comments: R  AKA, profound  weakness  left  hip, knee, demonstrates no ankle dorsi/plantar flex,  lacks P/AROM Knee extension appearing contracted;  per patient, contracture PTA(WC dep)also Restrictions Weight Bearing Restrictions: No Other Position/Activity Restrictions: will not be able to stand on Left leg        ADL either performed or assessed with clinical judgement   ADL Overall ADL's : Needs assistance/impaired     Grooming: Set up;Bed level;Wash/dry face;Wash/dry hands Grooming Details (indicate cue type and reason): washed hands and face at bed level                                    Extremity/Trunk Assessment    Continues to exhibit good bicep/tricep strength - therapist assessed.           Vision Patient Visual Report: No change from baseline     Perception     Praxis      Cognition Arousal/Alertness: Awake/alert Behavior During Therapy: WFL for tasks assessed/performed Overall Cognitive Status: History of cognitive impairments - at baseline                                 General Comments: Hx of some cognitive decline memory deficits        Exercises      Shoulder Instructions       General Comments      Pertinent Vitals/ Pain       Pain Assessment Pain Assessment: No/denies pain  Home Living  Prior Functioning/Environment              Frequency  Min 2X/week        Progress Toward Goals  OT Goals(current goals can now be found in the care plan section)  Progress towards OT goals: Not progressing toward goals - comment  Acute Rehab OT Goals Patient Stated Goal: "I'm a fighter" OT Goal Formulation: With patient Time For Goal Achievement: 01/04/22 Potential to Achieve Goals: [place] Discharge plan remains appropriate    Co-evaluation          OT goals addressed during session: ADL's and self-care      AM-PAC OT "6 Clicks" Daily Activity      Outcome Measure   Help from another person eating meals?: A Little Help from another person taking care of personal grooming?: A Little Help from another person toileting, which includes using toliet, bedpan, or urinal?: Total Help from another person bathing (including washing, rinsing, drying)?: A Lot Help from another person to put on and taking off regular upper body clothing?: A Lot Help from another person to put on and taking off regular lower body clothing?: Total 6 Click Score: 12    End of Session Equipment Utilized During Treatment: Gait belt  OT Visit Diagnosis: Muscle weakness (generalized) (M62.81);Other abnormalities of gait and mobility (R26.89);Pain   Activity Tolerance Patient limited by fatigue   Patient Left in bed;with call bell/phone within reach;with family/visitor present;with bed alarm set   Nurse Communication          Time: 0102-7253 OT Time Calculation (min): 9 min  Charges: OT General Charges $OT Visit: 1 Visit OT Treatments $Self Care/Home Management : 8-22 mins  Derl Barrow, OTR/[facility] [place]  Office [phone] Pager: [place] 12/29/2021, 2:49 PM

## 2021-12-29 NOTE — Assessment & Plan Note (Addendum)
-   See above we will stop his Unasyn now given that he has a rash -Added hydrocortisone cream but given that his rash worsen he was started on systemic steroids and we are now weaning and going to 40 mg of Solu-Medrol daily -Rash worsened and developed significantly after fentanyl patch was placed and this is unlikely the cause but given that this is something you elevated will hold and stop fentanyl patch -Likely a drug reaction though not related to the fentanyl patch; Try Reinitiating Fentanyl Patch -Continue to monitor and patient is itching so he was initiated on Benadryl

## 2021-12-29 NOTE — Progress Notes (Signed)
At Dr. Weston Settle instruction, fentanyl patch was removed from R flank, close to where rash originated. Son present. Discarded in trash per charge RN.

## 2021-12-29 NOTE — Progress Notes (Signed)
PROGRESS NOTE    Derek Anon Haack Jr.  QJF:[id number] DOB: 13-Jul-1951 DOA: 12/18/2021 PCP: [name], FNP   Brief Narrative:  The patient is a 71 year old M with PMH of EtOH cirrhosis, nonimmune hemolytic anemia, pancytopenia, right AKA and hepatitis C brought to ED with altered mental status after he slid off his bed and found down on the ground by family, and admitted to ICU with hypovolemic shock, hemolytic anemia, liver failure, encephalopathy, AKI and hyperkalemia.  In ED, hypotensive and altered.  Hgb 4.0.  Lactic acid> 9.  AST 495.  ALT 107.  Bili 8.  He was started on Levophed, IV antibiotics and blood transfusion. Hematology consulted. Patient came off Levophed and transferred to [facility] hospitalist service on 5/24.    RUQ [place] concerning for liver cirrhosis and liver mass. 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  Liver cirrhosis, portal HTN, splenomegaly, ascites. General surgery consulted and recommended conservative care with IV antibiotics.   5/26-patient was on IV fluid for AKI and rhabdomyolysis but developed significant scrotal edema.  AKI resolved.  IV fluid discontinued.  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  He also had bloody stool later in the day.  Patient was moved to ICU.  PCCM, general surgery and IR consulted.  Antibiotics broadened.  527-transfused a unit of FFP and 2 units of PRBC.  Had percutaneous cholecystostomy tube.  Orthopedic surgery and GI consulted, and did not feel surgical intervention is indicated.  528-Transferred to progressive  care.  Started on p.o. Seroquel for delirium.  De-escalated antibiotics to IV Unasyn based on biliary culture.  No further GI bleed but blood counts dropped likely due to hemolytic anemia and he was transfused another 1 unit PRBCs on 12/26/2021 early in the AM.    12/27/21 Hemoglobin/hematocrit seems to have stabilized now.  He is still complaining of some leg pain so will be discussed with Ortho.  General surgery has now signed off the case and they are making a referral to [facility] for surgeon/hepatologist there to help address all of his liver issues.  IR evaluated and recommending continuing the [place] catheter with 3 times daily flushes with 5 cc of normal saline acute guarding output every shift and following up in 6 to 8 weeks for replacement or for repeat imaging/possible drain injection.  5/31-6/1: Vascular surgery evaluated and he underwent a CTA of the abdomen and pelvis with bilateral runoff and Dr. Trula Slade reviewed with the radiologist and they feel his vascular disease likely result of a high-grade right common femoral artery stenosis and because of his multiple comorbidities including cirrhosis and thrombocytopenia he is not an operative candidate and he feels that they would not stent his common femoral artery given the comorbidities.  Orthopedic surgery evaluated his new CT angio and they feel that he still not a surgical candidate at this time given that he is clinically stable and slightly improving.  They feel that if his symptoms progress into florid infection and he requires surgery would be a radical series of debridements of the left thigh or left hip disarticulation which would be extremely morbid and associated with a very  high perioperative mortality.  Palliative care was consulted for further pain assistance and management and patient is improving but overnight from 6/1 till 6/2 he spiked a temperature of 101.  He also developed a diffuse body rash and questionable drug reaction.   Given his temperature and rash ID was consulted and he was otherwise hemodynamically stable.  Patient no localizing infectious syndrome currently and he was started on fluconazole for Candida in his bile.  There is no sign of pneumonia or GU infection and he had this diffuse maculopapular erythematous rash on the trunk and arms which appear to be likely drug rash so ID discontinued his Unasyn and recommending continuing fluconazole for Candida in the drain until 01/03/2022.  ID recommended that the rash may get worse but should stabilize early next week and recommended considering anemia SBP given his cirrhosis status so we will obtain a ultrasound of the abdomen.  Blood cultures x2 were obtained and these will need to be followed but patient will follow-up with general surgeon outpatient setting.    Assessment and Plan: * Septic shock ([place]) -Patient was Tachypneic, hypotensive with AKI, lactic acidosis and thrombocytopenia, and required vasopressors on admission.   -Imaging raises concern for calculus cholecystitis, possible cholecystocolonic fistula left thigh muscle necrosis versus neck fasciitis.   -Blood and urine cultures NGTD.  Biliary culture with pansensitive Enterococcus faecalis and faecium as well as lactobacillus species.   -Hemodynamically stable.  No leukocytosis and mildly leukopenic  -High risk for surgical intervention -Percutaneous cholecystostomy tube by IR on 5/27 -CTX and Flagyl >>>Vanc, Cefepime and Flagyl 5/27>>> IV Unasyn 5/30>> -Patient spiked a temperature overnight and has diffuse maculopapular erythematous rash and likely a drug rash so his Unasyn is now been discontinued -ID has been consulted and appreciate further evaluation and recommending to evaluate for SBP and feel that the rash and the fever could be related to a drug rash so they recommended stopping Unasyn and recommended continuing fluconazole for Candida in his drain culture for 5 days -We will need to watch  his rash carefully and ID feels it can get worse as we can but if it remains stable or improved next week can be discharged early -General surgery sent referral to [facility] for outpatient follow-up -Discharge plan in place from IR standpoint -Blood cultures x2 reordered and pending -Tolerating soft diet and likley to D/C to SNF once rash improves and once we know that he is not having any more fevers   Acute pain of left thigh/rhabdomyolysis/myonecrosis -CT abdomen and pelvis shows muscular swelling with tracking gas within the proximal muscle compartments likely myonecrosis from traumatic rhabdomyolysis versus neck fasciitis.   -CK improving. -Orthopedic surgery signed off but will have them re-evaluate and they feel he is not a surgical candidate regardless but agree with Vascular Evaluation -MRI leg has been ordered for further investigation but will discontinue this given his repeat CTA with bilateral runoff; See Above MRI discontinued as it won't change management  -Continue monitoring closely   SVT (supraventricular tachycardia) ([place]) -One episode on 5/26.   -As symptomatic.  Normotensive.  Resolved with IV metoprolol. -C/w IV metoprolol as needed -Continue telemetry monitoring while hospitalized   Acute metabolic encephalopathy -Initially, patient was in septic shock with uremia and some degree of hepatic encephalopathy.  -Now he was having some delirium.  He is at risk for polypharmacy as well.  Delirium improved after low-dose Seroquel at night. -Seroquel 25 mg HS and IV Haldol 2 mg q6h PRN-risk and benefit  discussed with sons.  QTc 432. -Continue to Treat treatable causes -Minimize sedating medications -Reorientation and delirium precautions.  AKI (acute kidney injury) ([place]) Recent Labs    12/20/21 0223 12/21/21 0244 12/22/21 0413 12/22/21 2345 12/24/21 0309 12/25/21 0317 12/26/21 0310 12/27/21 0411 12/28/21 0401 12/29/21 0357  BUN 63* 42* '22 16 15 17 16 16 14 20   '$ CREATININE 1.89* 1.11 0.61 0.55* 0.65 0.56* 0.41* 0.46* 0.48* 0.55*  -Likely due to hypotension and rhabdomyolysis.  Resolved and improved. -Recheck renal function in the morning; renal function is much improved at 20/0.55 now -Avoid nephrotoxic medications, contrast dyes, hypotension and dehydration to ensure adequate renal perfusion and renally adjust medications -Repeat CMP in the a.m.   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 Elevated liver enzymes/hyperbilirubinemia; As Above Recent Labs  Lab 12/25/21 0317 12/26/21 0310 12/27/21 0411 12/28/21 0401 12/29/21 0357  AST 193* 111* 86* 83* 76*  ALT 125* 89* 70* 57* 46*  ALKPHOS 54 47 55 58 51  BILITOT 1.6* 1.3* 1.1 0.9 1.6*  PROT 5.0* 4.7* 4.8* 4.8* 4.6*  ALBUMIN 1.9* 1.8* 1.8* 1.8* 1.7*  LFT pattern consistent rhabdo.  Rhabdo improving.  He has mild coagulopathy as well. 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 LDH markedly elevated.  Haptoglobin low.  Had a splenomegaly and splenic infarction.  Had frank red blood in his stool on 5/26 but no further bleed after FFP and platelet transfusion. EGD 5/22 with gastritis and duodenitis.  Colo in 5/22 with polyps, diverticulosis and internal hemorrhoid.  -Transfused a total of 6 units PRBC, 2 apheresis of platelet and 1 FFP so far. -Hgb 6.6 the day before yesterday so he is transfused another 1 unit of PRBCs -[facility] signed off. -Monitor H&H and transfuse for Hgb <7.0.  -Now hemoglobin/hematocrit is improved and trend has gone from 8.9/28.6  -> 8.1/25.2 -> 8.4/26.1 -Repeat CBC in the AM   Splenic infarction -Wedge-shaped area of  hypoenhancement involving the anterior spleen concerning for splenic infarction noted on CT abdomen and pelvis. Repeat CTA shows stable lesion  -Per General Surgery. -They are planning to refer him out to [facility] for further issues; could be contributing to his thrombocytopenia  Left leg weakness and numbness -Likely from rhabdomyolysis/myonecrosis of left thigh. ABI with severe PAD and showed Resting left ankle-brachial index indicates severe left lower  extremity arterial disease. The left toe-brachial index is absent.  -He may need repeat ABI once he recovers -Closely monitor discussed with vascular surgery Dr. Trula Slade who will see the patient later this evening for  Further evaluation and recommendations -Orthopedic Surgery following and the numbness could be related to the myonecrosis however after discussion with the vascular surgery team they are requesting Neurology input; after discussion with neurology they are recommending obtaining MRI of the leg as well as MRI of the L-spine with and without contrast and have the orthopedic evaluate for possible compartment syndrome; patient was to undergo MRIs however vascular evaluated and recommended getting a CT of the abdomen pelvis bilateral runoff; given that an MRI would not change his management will discontinue the MRIs -I spoke with Dr. Kathaleen Bury and he feels the patient has  baseline numbness in that leg with myonecrosis of thigh from being found down and he would not be a surgical candidate to release any compartments as it is well over 48 hours since onset of being found down and has nothing else to offer from his standpoint but agrees with Vascular Evaluation  -Per my discussion with Dr. Kathaleen Bury even if his MRI shows some hematomas he is likely not a surgical candidate given that cannot heal incisions and would be exceptionally high risk for infection and he feels the left thigh has been stable for the past week and  the CT intra-muscular gas was incidentally identified on CT pelvis. -Dr. Kathaleen Bury feels that the patient has essentially no function of that LLE for past 2 years -Orthopedic surgery evaluated and evaluated his repeat CTA and they still feel that he is still not a surgical candidate and they feel that if his symptoms progressed to forward infection and he require surgery it would be likely be a radical serial debridement of the left thigh or left hip disarticulation which will either be extremely morbid associated with very high perioperative mortality -Vascular surgery evaluated and they feel that his vascular disease is likely the result of a very high-grade right common femoral artery stenosis but because of his multiple comorbidities including cirrhosis and thrombocytopenia is not a current operative candidate and they would not recommend stenting of common femoral artery at this time  Hyperkalemia, hyponatremia and hypomagnesemia Hyperkalemia resolved.   Hyponatremia resolved; last sodium went from 137 is now 135 -> 134 -Patient's Mag Level is now 1.8 again -Continue to monitor and replete as necessary; -Repeat CMP, mag, Phos in the AM   Thrombocytopenia ([place]) -Patient's Platelet Count went from 25 -> 25 -> 27 -> 22 -> 25 -> 28 -> 24 -> 38 -Had 2 apheresis of platelet on 5/27. -Transfuse for platelet < 10k or bleeding -Continue to Monitor for S/Sx of Bleeding; No overt bleeding noted  -Repeat CBC in the AM   Goals of care, counseling/discussion -Remains full code with full scope of care.  See discussion on 5/26 and 5/27 -Palliative medicine following and appreciate further evaluation   Abnormal MRI, liver-resolved as of 12/23/2021 -RUQ [place] concerning for 6 cm liver mass.  Not appreciated on CT abdomen with contrast. -Follow up in the outpatient with GI and General Surgery  Physical deconditioning -Wheelchair-bound at baseline.  He says he is able to transfer using left leg at  baseline. -PT/OT recommended SNF -Left leg evaluation as above   Scrotal edema -Likely due in the setting of liver cirrhosis, hypoalbuminemia and IVF hydration.  -IV fluid discontinued.   -Improving. -Continue monitoring and will need Scrotal Sling and Scrotal Support -May discuss with Urology for further outpatient workup   Drug rash - See above we will stop his Unasyn now -We will add hydrocortisone cream if necessary -Rash worsened and developed significantly after fentanyl patch was placed and this is unlikely the cause but given that this is something you elevated will hold and stop fentanyl patch  Elevated troponin -Likely demand ischemia in the setting of sepsis and shock most likely in the setting of rhabdomyolysis -Denies any current chest pain  Calculus of bile duct with acute cholecystitis -See shock  Hyponatremia - Mild and now sodium went from 137 and dropped to 134  -Continue to monitor and trend and repeat CMP in a.m.  Traumatic rhabdomyolysis (HCC) -CK was improving and went from 10,683 at its peak and is now 1873 ->  2149 -> 1737  Hyperbilirubinemia -Improving. T Bili went from 8.8 -> 0.9 slightly worsened and is now 1.6 -Continue to Monitor and Trend -Repeat CMP in the AM    Elevated liver enzymes - Improving significantly; patient's AST was 2284 and ALT was 545 and now trended down; AST is now 76 and ALT is now 20 -He is status post percutaneous cholecystostomy tube -Continue monitor and trend LFTs carefully  Chronic pain syndrome -C/w Oxycodone to 20 mg every 6 hours.  Per database, fills 120 for 30 days -Palliative care consulted for further goals of care and pain adjustment Fentanyl Patch was started but stopped given Rash  -Discontinue IV Dilaudid  Hemolytic anemia (HCC) -In the setting of Liver Cirrhosis -Patient's hemoglobin/hematocrit did drop to 6.6/90.9 and he was transfused 1 unit PRBCs and then elevated to 10.5/31.9.  Repeat this morning  was 8.2/25.2 and now hemoglobin/hematocrit is 8.9/28.6 -> 8.1/25.2 and is now 8.4/26.1 -Continue monitor and trend and continue monitor for signs or symptoms of bleeding; no overt bleeding noted -Repeat CBC in a.m.   Anxiety -Patient is adamant about his Xanax despite risk for polypharmacy and encephalopathy. -Resumed Alprazolam 1 mg po TIDprn Anxiety  DVT prophylaxis: SCDs Start: 12/18/21 1421    Code Status: Full Code Family Communication: Discussed with Son at bedside   Disposition Plan:  Level of care: Progressive Status is: Inpatient Remains inpatient appropriate because: Needs SNF and has a drug rash and a fever   Consultants:  PCCM Transfer General Surgery Orthopedic Surgery Oncology  Vascular Surgery Palliative Care Medicine Gastroenterology Interventional Radiology Infectious Diseases  Discussed with Neurology  Procedures:  ABI MRI Liver CT Abd/Pelvis Procedure: 10.2 fr Cholecystostomy drain done by Dr. Sharen Heck Mir Repeat CTA Abd/Pelvis  Antimicrobials:  Anti-infectives (From admission, onward)    Start     Dose/Rate Route Frequency Ordered Stop   12/29/21 1000  fluconazole (DIFLUCAN) IVPB 400 mg        400 mg 100 mL/hr over 120 Minutes Intravenous Every 24 hours 12/29/21 0918 01/03/22 0959   12/26/21 0800  Ampicillin-Sulbactam (UNASYN) 3 g in sodium chloride 0.9 % 100 mL IVPB  Status:  Discontinued        3 g 200 mL/hr over 30 Minutes Intravenous Every 6 hours 12/26/21 0711 12/29/21 1204   12/23/21 1100  vancomycin (VANCOREADY) IVPB 1250 mg/250 mL  Status:  Discontinued        1,250 mg 166.7 mL/hr over 90 Minutes Intravenous Every 12 hours 12/23/21 0922 12/26/21 0703   12/23/21 1000  ceFEPIme (MAXIPIME) 2 g in sodium chloride 0.9 % 100 mL IVPB  Status:  Discontinued        2 g 200 mL/hr over 30 Minutes Intravenous Every 8 hours 12/23/21 0922 12/26/21 0703   12/22/21 2048  metroNIDAZOLE (FLAGYL) IVPB 500 mg  Status:  Discontinued        500 mg 100  mL/hr over 60 Minutes Intravenous Every 12 hours 12/22/21 2049 12/26/21 0703   12/21/21 1100  cefTRIAXone (ROCEPHIN) 2 g in sodium chloride 0.9 % 100 mL IVPB  Status:  Discontinued        2 g 200 mL/hr over 30 Minutes Intravenous Every 24 hours 12/21/21 0900 12/23/21 0904   12/20/21 1830  metroNIDAZOLE (FLAGYL) IVPB 500 mg  Status:  Discontinued        500 mg 100 mL/hr over 60 Minutes Intravenous Every 12 hours 12/20/21 1727 12/22/21 2049   12/20/21 1100  cefTRIAXone (ROCEPHIN) 1 g in sodium  chloride 0.9 % 100 mL IVPB  Status:  Discontinued        1 g 200 mL/hr over 30 Minutes Intravenous Every 24 hours 12/20/21 1006 12/21/21 0900   12/19/21 2000  vancomycin (VANCOREADY) IVPB 1250 mg/250 mL  Status:  Discontinued        1,250 mg 166.7 mL/hr over 90 Minutes Intravenous Every 24 hours 12/18/21 1946 12/19/21 1207   12/19/21 1100  ceFEPIme (MAXIPIME) 2 g in sodium chloride 0.9 % 100 mL IVPB  Status:  Discontinued        2 g 200 mL/hr over 30 Minutes Intravenous Every 12 hours 12/19/21 1038 12/20/21 1006   12/19/21 0600  ceFEPIme (MAXIPIME) 2 g in sodium chloride 0.9 % 100 mL IVPB  Status:  Discontinued        2 g 200 mL/hr over 30 Minutes Intravenous Every 12 hours 12/18/21 1943 12/19/21 1038   12/18/21 1845  vancomycin (VANCOREADY) IVPB 2000 mg/400 mL        2,000 mg 200 mL/hr over 120 Minutes Intravenous STAT 12/18/21 1828 12/18/21 2151   12/18/21 1830  ceFEPIme (MAXIPIME) 2 g in sodium chloride 0.9 % 100 mL IVPB        2 g 200 mL/hr over 30 Minutes Intravenous STAT 12/18/21 1818 12/18/21 1951       Subjective: Seen and examined at bedside and he spiked a temperature overnight.  Developed diffuse body rash that is maculopapular and on his trunk and in his groin and his arm.  He denies any chest pain or shortness of breath.  Had his leg elevated today.  No nausea or vomiting.  No other concerns or complaints at this time.  Objective: Vitals:   12/29/21 0356 12/29/21 0358 12/29/21 0619  12/29/21 1423  BP: 109/65  103/64 121/69  Pulse: (!) 107  (!) 110 (!) 105  Resp:   15   Temp: (!) 101 F (38.3 C)  98.6 F (37 C) 98.2 F (36.8 C)  TempSrc: Oral  Oral Oral  SpO2: 95%  92% 99%  Weight:  109.8 kg    Height:        Intake/Output Summary (Last 24 hours) at 12/29/2021 1826 Last data filed at 12/29/2021 1300 Gross per 24 hour  Intake 1585 ml  Output 470 ml  Net 1115 ml   Filed Weights   12/27/21 0500 12/28/21 0500 12/29/21 0358  Weight: 105.3 kg 109.7 kg 109.8 kg   Examination: Physical Exam:  Constitutional: WN/WD obese chronically ill-appearing Caucasian male currently no acute distress Respiratory: Diminished to auscultation bilaterally, no wheezing, rales, rhonchi or crackles. Normal respiratory effort and patient is not tachypenic. No accessory muscle use.  Cardiovascular: RRR, no murmurs / rubs / gallops. S1 and S2 auscultated. No extremity edema. 2+ pedal pulses. No carotid bruits.  Abdomen: Soft, non-tender, Distended 2/2 body habitus and has a Perc Cholecystostomy Drain in place. Bowel sounds positive.  GU: Deferred. Musculoskeletal: Has a Right AKA  Skin: Has a diffuse body rash that is maculopapular and erythematous but is not itchy and likely a drug rash Neurologic: CN 2-12 grossly intact with no focal deficits. Romberg sign and cerebellar reflexes not assessed.  Psychiatric: Normal judgment and insight. Alert and oriented x 3. Normal mood and appropriate affect.   Data Reviewed: I have personally reviewed following labs and imaging studies  CBC: Recent Labs  Lab 12/26/21 0704 12/26/21 1450 12/27/21 0411 12/27/21 1249 12/28/21 0401 12/29/21 0357  WBC 3.0*  --  3.2* 3.9*  4.0 9.1  NEUTROABS  --   --   --  2.9 3.0 7.7  HGB 6.7* 10.5* 8.2* 8.9* 8.1* 8.4*  HCT 20.2* 31.9* 25.2* 28.6* 25.2* 26.1*  MCV 95.7  --  96.9 98.6 97.7 97.0  PLT 22*  --  25* 28* 24* 38*   Basic Metabolic Panel: Recent Labs  Lab 12/24/21 0309 12/25/21 0317 12/26/21 0310  12/27/21 0411 12/28/21 0401 12/29/21 0357  NA 133* 134* 134* 137 135 134*  K 3.5 3.7 3.6 3.6 3.7 4.9  CL 103 104 104 106 103 104  CO2 '26 24 24 24 24 25  '$ GLUCOSE 149* 138* 118* 130* 160* 156*  BUN '15 17 16 16 14 20  '$ CREATININE 0.65 0.56* 0.41* 0.46* 0.48* 0.55*  CALCIUM 7.6* 7.8* 7.5* 7.8* 7.7* 7.8*  MG 1.5* 1.4* 1.6* 1.6* 1.6* 1.8  PHOS 3.5 3.2 3.4  --  3.3 4.1   GFR: Estimated Creatinine Clearance: 106.8 mL/min (A) (by C-G formula based on SCr of 0.55 mg/dL (L)). Liver Function Tests: Recent Labs  Lab 12/25/21 0317 12/26/21 0310 12/27/21 0411 12/28/21 0401 12/29/21 0357  AST 193* 111* 86* 83* 76*  ALT 125* 89* 70* 57* 46*  ALKPHOS 54 47 55 58 51  BILITOT 1.6* 1.3* 1.1 0.9 1.6*  PROT 5.0* 4.7* 4.8* 4.8* 4.6*  ALBUMIN 1.9* 1.8* 1.8* 1.8* 1.7*   No results for input(s): LIPASE, AMYLASE in the last 168 hours. Recent Labs  Lab 12/22/21 2345 12/24/21 0309 12/25/21 0317 12/26/21 0704 12/27/21 0411  AMMONIA 36* 21 33 32 34   Coagulation Profile: Recent Labs  Lab 12/22/21 2345 12/24/21 0309 12/25/21 0317 12/26/21 0310 12/27/21 0411  INR 1.3* 1.3* 1.4* 1.4* 1.4*   Cardiac Enzymes: Recent Labs  Lab 12/25/21 0317 12/26/21 0310 12/27/21 0411 12/28/21 0401 12/29/21 0357  CKTOTAL 4,082* 2,204* 1,873* 2,149* 1,737*   BNP (last 3 results) No results for input(s): PROBNP in the last 8760 hours. HbA1C: No results for input(s): HGBA1C in the last 72 hours. CBG: No results for input(s): GLUCAP in the last 168 hours. Lipid Profile: No results for input(s): CHOL, HDL, LDLCALC, TRIG, CHOLHDL, LDLDIRECT in the last 72 hours. Thyroid Function Tests: No results for input(s): TSH, T4TOTAL, FREET4, T3FREE, THYROIDAB in the last 72 hours. Anemia Panel: No results for input(s): VITAMINB12, FOLATE, FERRITIN, TIBC, IRON, RETICCTPCT in the last 72 hours. Sepsis Labs: Recent Labs  Lab 12/23/21 1448  LATICACIDVEN 2.2*    Recent Results (from the past 240 hour(s))  MRSA  Next Gen by PCR, Nasal     Status: Abnormal   Collection Time: 12/20/21  9:40 AM   Specimen: Nasal Mucosa; Nasal Swab  Result Value Ref Range Status   MRSA by PCR Next Gen DETECTED (A) NOT DETECTED Final    Comment: (NOTE) The GeneXpert MRSA Assay (FDA approved for NASAL specimens only), is one component of a comprehensive MRSA colonization surveillance program. It is not intended to diagnose MRSA infection nor to guide or monitor treatment for MRSA infections. Test performance is not FDA approved in patients less than 50 years old. Performed at [facility], [place] [address], [place], [place] [address]   Aerobic/Anaerobic Culture w Gram Stain (surgical/deep wound)     Status: None   Collection Time: 12/23/21 11:58 AM   Specimen: BILE  Result Value Ref Range Status   Specimen Description   Final    BILE Performed at [place] [address], [place], [place] [address]  Special Requests   Final    NONE Performed at [facility], [place] [address] [place], [place] [address]    Gram Stain   Final    ABUNDANT WBC PRESENT, PREDOMINANTLY MONONUCLEAR MODERATE GRAM POSITIVE COCCI IN PAIRS Performed at [facility], [place] [address], [place], [place] [address]    Culture   Final    MODERATE ENTEROCOCCUS FAECALIS MODERATE ENTEROCOCCUS FAECIUM ABUNDANT LACTOBACILLUS SPECIES Standardized susceptibility testing for this organism is not available. FEW CANDIDA ALBICANS NO ANAEROBES ISOLATED; CULTURE IN PROGRESS FOR 5 DAYS    Report Status 12/28/2021 FINAL  Final   Organism ID, Bacteria ENTEROCOCCUS FAECALIS  Final   Organism ID, Bacteria ENTEROCOCCUS FAECIUM  Final      Susceptibility   Enterococcus faecalis - MIC*    AMPICILLIN <=2 SENSITIVE Sensitive     VANCOMYCIN 2 SENSITIVE Sensitive     GENTAMICIN SYNERGY SENSITIVE Sensitive     * MODERATE ENTEROCOCCUS FAECALIS   Enterococcus faecium - MIC*    AMPICILLIN <=2  SENSITIVE Sensitive     VANCOMYCIN <=0.5 SENSITIVE Sensitive     GENTAMICIN SYNERGY SENSITIVE Sensitive     * MODERATE ENTEROCOCCUS FAECIUM  Culture, blood (Routine X 2) w Reflex to ID Panel     Status: None   Collection Time: 12/23/21  2:48 PM   Specimen: BLOOD  Result Value Ref Range Status   Specimen Description   Final    BLOOD LEFT ANTECUBITAL Performed at [facility], [place] [address] [place], [place] [address]    Special Requests   Final    NONE Performed at [facility], [place] [address], [place], [place] [address]    Culture   Final    NO GROWTH 5 DAYS Performed at [facility], [place] [address], [place], [place] [address]    Report Status 12/28/2021 FINAL  Final  Culture, blood (Routine X 2) w Reflex to ID Panel     Status: None   Collection Time: 12/23/21  2:48 PM   Specimen: BLOOD  Result Value Ref Range Status   Specimen Description   Final    BLOOD RIGHT ANTECUBITAL Performed at [facility], [place] [address], [place], [place] [address]    Special Requests   Final    NONE Performed at [facility], [place] [address], [place], [place] [address]    Culture   Final    NO GROWTH 5 DAYS Performed at [facility], [place] [address], [place], [place] [address]    Report Status 12/28/2021 FINAL  Final    Radiology Studies: CT ANGIO AO+BIFEM W & OR WO CONTRAST  Addendum Date: 12/27/2021   ADDENDUM REPORT: 12/27/2021 23:09 ADDENDUM: These results were called by telephone at the time of interpretation on 12/27/2021 at 11:07 pm to provider Harold Barban , who verbally acknowledged these results. Electronically Signed   By: Ronney Asters M.D.   On: 12/27/2021 23:09   Result Date: 12/27/2021 CLINICAL DATA:  Lower extremity vascular trauma. EXAM: CT ANGIOGRAPHY OF ABDOMINAL AORTA WITH ILIOFEMORAL RUNOFF TECHNIQUE: Multidetector CT imaging of the abdomen, pelvis and left lower extremity was performed using the standard  protocol during bolus administration of intravenous contrast. Multiplanar CT image reconstructions and MIPs were obtained to evaluate the vascular anatomy. RADIATION DOSE REDUCTION: This exam was performed according to the departmental dose-optimization program which includes automated exposure control, adjustment of the mA and/or kV according to patient size  and/or use of iterative reconstruction technique. CONTRAST:  161m OMNIPAQUE IOHEXOL 350 MG/ML SOLN COMPARISON:  Left femur x-ray 10/23/2021. CT abdomen and pelvis 12/22/2021. FINDINGS: VASCULAR Aorta: Normal caliber aorta without aneurysm, dissection, vasculitis or significant stenosis. There is severe atherosclerotic calcifications of the aorta. Celiac: There is moderate focal stenosis at the origin of the celiac artery secondary to calcified atherosclerotic disease. Vessels are otherwise patent. SMA: There is severe stenosis in the proximal superior mesenteric artery secondary to calcified atherosclerotic disease measuring 15 mm in length. Vessels otherwise patent. Renals: Left renal artery appears patent. There is a questionable stent at its origin. There is severe stenosis at the origin of the right renal artery secondary to calcified atherosclerotic disease. The vessels are grossly patent distally. IMA: Patent without evidence of aneurysm, dissection, vasculitis or significant stenosis. LEFT Lower Extremity Inflow: There is calcified atherosclerotic disease in the left common iliac artery. Left common iliac artery and left external iliac artery appear patent without focal stenosis or dissection. Outflow: There is severe focal stenosis in the left common iliac artery proximally secondary to calcified atherosclerotic disease. Flow is seen distally within the left common femoral artery. Flow is also identified within the superficial and profunda femoral arteries, although some calcified plaque is present. There is some mild areas of stenosis. No severe areas  of stenosis, dissection or thrombosis identified. Popliteal artery patent. Runoff: Patent three vessel runoff to the ankle. Veins: No obvious venous abnormality within the limitations of this arterial phase study. Review of the MIP images confirms the above findings. NON-VASCULAR Lower chest: There is bibasilar atelectasis. Hepatobiliary: There is a new percutaneous cholecystostomy tube in place. Gallstones are again seen. Air in the gallbladder is again seen. Gallbladder wall thickening and mild surrounding inflammation persists. No biliary ductal dilatation. No new focal liver lesions. Pancreas: Unremarkable. No pancreatic ductal dilatation or surrounding inflammatory changes. Spleen: Spleen is mildly enlarged, unchanged. Wedge-shaped area of hypoenhancement in the spleen is unchanged compatible with splenic infarct. Adrenals/Urinary Tract: Adrenal glands are unremarkable. Kidneys are normal, without renal calculi, focal lesion, or hydronephrosis. Bladder is unremarkable. Stomach/Bowel: Stomach is within normal limits. Appendix appears normal. No evidence of bowel wall thickening, distention, or inflammatory changes. There is sigmoid colon diverticulosis without evidence for acute diverticulitis. Air-fluid levels are seen scattered throughout the colon. There is moderate gaseous distention of the stomach. Lymphatic: No enlarged lymph nodes are identified in the abdomen or pelvis. Reproductive: Prostate gland within normal limits. Other: No ascites or abdominal wall hernia. There is diffuse body wall edema. Musculoskeletal: There is no acute fracture or dislocation identified. No cortical erosions are seen. Degenerative changes affect the left knee. Chronic compression deformity of L2 is unchanged. There is intramuscular edema, swelling, fascial edema and intramuscular air in the medial compartment of the left thigh. No discrete fluid collections are identified. There is diffuse subcutaneous edema of the entire  lower extremity on the left. There is also subcutaneous edema of the visualized upper portion of the right lower extremity. There is marked scrotal wall edema as well. No areas of arterial enhancement are seen within the medial left thigh musculature. IMPRESSION: VASCULAR 1. Severe calcified atherosclerotic disease causes significant focal stenosis in the left common iliac artery, superior mesenteric artery and right renal artery. 2. No evidence for thrombosis or dissection. 3.  Aortic Atherosclerosis (ICD10-I70.0). NON-VASCULAR 1. Intramuscular and deep fat plane edema and intramuscular air in the medial compartment of the left thigh. Findings are concerning for infection/fasciitis. Necrotizing fasciitis not excluded.  No evidence for abscess. No active arterial bleeding seen in this region. 2. Diffuse body wall edema and, subcutaneous edema of the left lower extremity and scrotal wall edema. 3. Scattered air-fluid levels throughout the colon can be seen in the setting of diarrhea. No bowel obstruction. 4. New percutaneous cholecystostomy tube in place. Cholelithiasis with gallbladder wall thickening and inflammation appears similar to prior examination worrisome for cholecystitis. 5. Stable splenic infarct. Electronically Signed: By: Ronney Asters M.D. On: 12/27/2021 22:40    Scheduled Meds:  (feeding supplement) PROSource Plus  30 mL Oral Daily   feeding supplement  237 mL Oral BID BM   fentaNYL  1 patch Transdermal Q72H   lactulose  20 g Oral BID   levothyroxine  50 mcg Oral Q0600   magnesium oxide  400 mg Oral BID   mouth rinse  15 mL Mouth Rinse BID   multivitamin with minerals  1 tablet Oral Daily   pantoprazole  40 mg Oral BID   QUEtiapine  25 mg Oral QHS   sodium chloride flush  5 mL Intracatheter Q8H   Continuous Infusions:  sodium chloride Stopped (12/23/21 0547)   fluconazole (DIFLUCAN) IV 400 mg (12/29/21 0954)    LOS: 11 days   Raiford Noble, DO [facility] Available via  Epic secure chat 7am-7pm After these hours, please refer to coverage provider listed on [email or web address] 12/29/2021, 6:26 PM

## 2021-12-29 NOTE — Assessment & Plan Note (Addendum)
-   Mild and now sodium went from 137 and dropped to 134 -> 130 -> 132 -> 135 -> 138 -Likely the patient is hypervolemic hyponatremic so we will start diuresis -Continue to monitor and trend and repeat CMP in a.m.

## 2021-12-29 NOTE — Progress Notes (Signed)
   12/29/21 0356  Assess: MEWS Score  Temp (!) 101 F (38.3 C)  BP 109/65  MAP (mmHg) 78  Pulse Rate (!) 107  SpO2 95 %  O2 Device Room Air  Assess: MEWS Score  MEWS Temp 1  MEWS Systolic 0  MEWS Pulse 1  MEWS RR 0  MEWS LOC 0  MEWS Score 2  MEWS Score Color Yellow   On-call NP and charge nurse notified of fever. Pt reluctant to take Tylenol due to liver condition. NP encouraged Tylenol in light of the fever and pt agreed to take Tylenol. RN notified NP of growing rash on abdomen and outlined area. RN will continue to monitor.

## 2021-12-30 ENCOUNTER — Inpatient Hospital Stay (HOSPITAL_COMMUNITY): Payer: Medicare Other

## 2021-12-30 DIAGNOSIS — A419 Sepsis, unspecified organism: Secondary | ICD-10-CM | POA: Diagnosis not present

## 2021-12-30 DIAGNOSIS — L270 Generalized skin eruption due to drugs and medicaments taken internally: Secondary | ICD-10-CM

## 2021-12-30 DIAGNOSIS — M79652 Pain in left thigh: Secondary | ICD-10-CM | POA: Diagnosis not present

## 2021-12-30 DIAGNOSIS — G9341 Metabolic encephalopathy: Secondary | ICD-10-CM | POA: Diagnosis not present

## 2021-12-30 DIAGNOSIS — G894 Chronic pain syndrome: Secondary | ICD-10-CM | POA: Diagnosis not present

## 2021-12-30 DIAGNOSIS — Z515 Encounter for palliative care: Secondary | ICD-10-CM | POA: Diagnosis not present

## 2021-12-30 DIAGNOSIS — E8770 Fluid overload, unspecified: Secondary | ICD-10-CM

## 2021-12-30 DIAGNOSIS — R932 Abnormal findings on diagnostic imaging of liver and biliary tract: Secondary | ICD-10-CM | POA: Diagnosis not present

## 2021-12-30 LAB — CBC WITH DIFFERENTIAL/PLATELET
Abs Immature Granulocytes: 0.11 10*3/uL — ABNORMAL HIGH (ref 0.00–0.07)
Basophils Absolute: 0 10*3/uL (ref 0.0–0.1)
Basophils Relative: 0 %
Eosinophils Absolute: 0.1 10*3/uL (ref 0.0–0.5)
Eosinophils Relative: 2 %
HCT: 22.3 % — ABNORMAL LOW (ref 39.0–52.0)
Hemoglobin: 7.1 g/dL — ABNORMAL LOW (ref 13.0–17.0)
Immature Granulocytes: 2 %
Lymphocytes Relative: 14 %
Lymphs Abs: 1 10*3/uL (ref 0.7–4.0)
MCH: 31.7 pg (ref 26.0–34.0)
MCHC: 31.8 g/dL (ref 30.0–36.0)
MCV: 99.6 fL (ref 80.0–100.0)
Monocytes Absolute: 0.3 10*3/uL (ref 0.1–1.0)
Monocytes Relative: 5 %
Neutro Abs: 5.4 10*3/uL (ref 1.7–7.7)
Neutrophils Relative %: 77 %
Platelets: 37 10*3/uL — ABNORMAL LOW (ref 150–400)
RBC: 2.24 MIL/uL — ABNORMAL LOW (ref 4.22–5.81)
RDW: 18.3 % — ABNORMAL HIGH (ref 11.5–15.5)
WBC: 7 10*3/uL (ref 4.0–10.5)
nRBC: 0 % (ref 0.0–0.2)

## 2021-12-30 LAB — COMPREHENSIVE METABOLIC PANEL
ALT: 41 U/L (ref 0–44)
AST: 71 U/L — ABNORMAL HIGH (ref 15–41)
Albumin: 1.7 g/dL — ABNORMAL LOW (ref 3.5–5.0)
Alkaline Phosphatase: 54 U/L (ref 38–126)
Anion gap: 7 (ref 5–15)
BUN: 30 mg/dL — ABNORMAL HIGH (ref 8–23)
CO2: 25 mmol/L (ref 22–32)
Calcium: 7.9 mg/dL — ABNORMAL LOW (ref 8.9–10.3)
Chloride: 98 mmol/L (ref 98–111)
Creatinine, Ser: 0.63 mg/dL (ref 0.61–1.24)
GFR, Estimated: >60 mL/min (ref >60)
Glucose, Bld: 121 mg/dL — ABNORMAL HIGH (ref 70–99)
Potassium: 5.2 mmol/L — ABNORMAL HIGH (ref 3.5–5.1)
Sodium: 130 mmol/L — ABNORMAL LOW (ref 135–145)
Total Bilirubin: 1.9 mg/dL — ABNORMAL HIGH (ref 0.3–1.2)
Total Protein: 4.8 g/dL — ABNORMAL LOW (ref 6.5–8.1)

## 2021-12-30 LAB — MAGNESIUM: Magnesium: 1.8 mg/dL (ref 1.7–2.4)

## 2021-12-30 LAB — PHOSPHORUS: Phosphorus: 4.7 mg/dL — ABNORMAL HIGH (ref 2.5–4.6)

## 2021-12-30 LAB — CK: Total CK: 1270 U/L — ABNORMAL HIGH (ref 49–397)

## 2021-12-30 MED ORDER — SODIUM ZIRCONIUM CYCLOSILICATE 10 G PO PACK
10.0000 g | PACK | Freq: Once | ORAL | Status: AC
Start: 2021-12-30 — End: 2021-12-30
  Administered 2021-12-30: 10 g via ORAL
  Filled 2021-12-30: qty 1

## 2021-12-30 MED ORDER — HYDROCORTISONE 1 % EX CREA
TOPICAL_CREAM | Freq: Three times a day (TID) | CUTANEOUS | Status: DC
  Filled 2021-12-30 (×2): qty 28

## 2021-12-30 MED ORDER — METHYLPREDNISOLONE SODIUM SUCC 125 MG IJ SOLR
60.0000 mg | Freq: Two times a day (BID) | INTRAMUSCULAR | Status: DC
  Administered 2021-12-30 – 2022-01-01 (×5): 60 mg via INTRAVENOUS
  Filled 2021-12-30 (×5): qty 2

## 2021-12-30 MED ORDER — DIPHENHYDRAMINE HCL 50 MG/ML IJ SOLN: 25.0000 mg | Freq: Four times a day (QID) | INTRAMUSCULAR | Status: DC | PRN

## 2021-12-30 MED ORDER — DIPHENHYDRAMINE HCL 50 MG/ML IJ SOLN
12.5000 mg | Freq: Four times a day (QID) | INTRAMUSCULAR | Status: DC | PRN
Start: 2021-12-30 — End: 2022-01-02
  Administered 2021-12-30 – 2022-01-01 (×3): 12.5 mg via INTRAVENOUS
  Filled 2021-12-30 (×3): qty 1

## 2021-12-30 MED ORDER — FUROSEMIDE 10 MG/ML IJ SOLN
40.0000 mg | Freq: Two times a day (BID) | INTRAMUSCULAR | Status: DC
  Administered 2021-12-30 – 2021-12-31 (×2): 40 mg via INTRAVENOUS
  Filled 2021-12-30 (×2): qty 4

## 2021-12-30 MED ORDER — FUROSEMIDE 10 MG/ML IJ SOLN
40.0000 mg | Freq: Once | INTRAMUSCULAR | Status: AC
  Administered 2021-12-30: 40 mg via INTRAVENOUS
  Filled 2021-12-30: qty 4

## 2021-12-30 NOTE — Progress Notes (Signed)
Patient slightly diaphoretic. Low grade temp of 99. Ice packs applied. Will monitor temp- patient agreeable to taking tylenol if fever worsens.    12/30/21 1238  Vitals  Temp 99 F (37.2 C)  Temp Source Oral  BP (!) 106/53  MAP (mmHg) 70  BP Location Left Arm  BP Method Automatic  Patient Position (if appropriate) Lying  Pulse Rate 85  Pulse Rate Source Monitor  Resp 16  MEWS COLOR  MEWS Score Color Green  Oxygen Therapy  SpO2 95 %  O2 Device Room Air  Pain Assessment  Pain Scale 0-10  Pain Score 8  MEWS Score  MEWS Temp 0  MEWS Systolic 0  MEWS Pulse 0  MEWS RR 0  MEWS LOC 0  MEWS Score 0

## 2021-12-30 NOTE — Progress Notes (Signed)
Daily Progress Note   Patient Name: [name].       Date: 12/30/2021 DOB: January 24, 1951  Age: 71 y.o. MRN#: [id number] Attending Physician: Kerney Elbe, DO Primary Care Physician: [name], FNP Admit Date: 12/18/2021  Reason for Consultation/Follow-up: Establishing goals of care  Subjective: I saw and examined [name] and talk with his son, [name], at the bedside as well.  He has developed new rash and noted fever overnight.  It is noted that only new medication is fentanyl patch.  We discussed plan to discontinue fentanyl patch, although I also shared with him that looking at it, I do not think that the addition of fentanyl patch is likely to be the underlying cause of his rash.  Length of Stay: 12  Current Medications: Scheduled Meds:   (feeding supplement) PROSource Plus  30 mL Oral Daily   feeding supplement  237 mL Oral BID BM   fentaNYL  1 patch Transdermal Q72H   lactulose  20 g Oral BID   levothyroxine  50 mcg Oral Q0600   magnesium oxide  400 mg Oral BID   mouth rinse  15 mL Mouth Rinse BID   multivitamin with minerals  1 tablet Oral Daily   pantoprazole  40 mg Oral BID   QUEtiapine  25 mg Oral QHS   sodium chloride flush  5 mL Intracatheter Q8H   sodium zirconium cyclosilicate  10 g Oral Once    Continuous Infusions:  sodium chloride Stopped (12/23/21 0547)   fluconazole (DIFLUCAN) IV 400 mg (12/29/21 0954)    PRN Meds: sodium chloride, acetaminophen, ALPRAZolam, docusate, haloperidol lactate, metoprolol tartrate, oxyCODONE, polyethylene glycol  Physical Exam         Wake alert oriented resting in bed Appears chronically ill Right AKA No focal deficits Has cholecystostomy drain Regular work of breathing  Vital Signs: BP (!) 111/59  (BP Location: Right Arm)   Pulse 96   Temp 98.7 F (37.1 C) (Oral)   Resp 15   Ht 6' (1.829 m)   Wt 110.7 kg   SpO2 97%   BMI 33.10 kg/m  SpO2: SpO2: 97 % O2 Device: O2 Device: Room Air O2 Flow Rate: O2 Flow Rate (L/min): 2 L/min  Intake/output summary:  Intake/Output Summary (Last 24 hours) at 12/30/2021 0843 Last data  filed at 12/30/2021 0700 Gross per 24 hour  Intake 2180 ml  Output 625 ml  Net 1555 ml    LBM: Last BM Date : 12/29/21 Baseline Weight: Weight: 88.8 kg Most recent weight: Weight: 110.7 kg       Palliative Assessment/Data:    Flowsheet Rows    Flowsheet Row Most Recent Value  Intake Tab   Referral Department Hospitalist  Unit at Time of Referral Med/Surg Unit  Palliative Care Primary Diagnosis Sepsis/Infectious Disease  Date Notified 12/28/21  Clinical Assessment   Psychosocial & Spiritual Assessment   Palliative Care Outcomes        Patient Active Problem List   Diagnosis Date Noted   Acute pain of left thigh/rhabdomyolysis/myonecrosis 12/23/2021   Septic shock ([place]) 12/18/2021   SVT (supraventricular tachycardia) ([place]) 12/22/2021   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 97/94/8016   AKI (acute kidney injury) ([place]) 55/37/4827   Acute metabolic encephalopathy 07/86/7544   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    Disorder of skeletal system 02/29/2020   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   Left leg weakness and numbness 12/21/2021   Thrombocytopenia ([place]) 12/20/2021   Hyperkalemia, hyponatremia and hypomagnesemia 12/20/2021   Goals of care, counseling/discussion 02/29/2020   Physical deconditioning 02/29/2020   Scrotal edema 12/23/2021   Cholecystitis    Drug rash    GI bleed 12/23/2021   Calculus of bile duct with acute cholecystitis 12/21/2021   Elevated troponin 12/21/2021   Elevated liver enzymes 12/20/2021   Hyperbilirubinemia 12/20/2021   Traumatic rhabdomyolysis ([place]) 12/20/2021   Hyperkalemia 12/20/2021    Hyponatremia 12/20/2021   Hypomagnesemia 12/16/2020   Gastric nodule    Duodenal erythema    Polyp of colon    Left leg swelling 12/14/2020   Eye trauma 02/29/2020   History of pancytopenia 02/29/2020   Portal venous hypertension ([place]) 02/29/2020   S/P AKA (above knee amputation) unilateral ([place]) 02/29/2020   Hepatic cirrhosis due to chronic hepatitis C infection ([place]) 02/29/2020   HTN (hypertension) 09/14/2019   Hepatic encephalopathy ([place]) 04/07/2018   Osteoarthritis of left knee 02/26/2018   Chronic pain syndrome 11/23/2016   Hypothyroidism 09/25/2016   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   Hepatitis B core antibody positive 05/22/2016   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[place]) 04/14/2015   Cellulitis of right thigh 05/21/2012   Pancytopenia ([place]) 02/26/2012   Encounter for long-term (current) use of other medications 02/26/2012   Screening for prostate cancer 02/26/2012   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  We will DC although I do not think this is likely to be the underlying cause of his rash.  Continue with oxycodone as needed for breakthrough pain. Discussed goals and advance care planning on 6/1.  He remains Full Code/Full Scope.  Introduced MOST form and discussed how to develop plan of care to focus on continuing therapies that would maximize chance of being well enough to return home and limiting therapies not in line with this goal.  His son, [name] is his HCPOA.  I gave him a copy of [facility] [employer] to review.  Recommend outpatient follow-up.    Code  Status:    Code Status Orders  (From  admission, onward)           Start     Ordered   12/18/21 1422  Full code  Continuous        12/18/21 1421           Code Status History     Date Active Date Inactive Code Status Order ID Comments User Context   12/14/2020 1507 12/18/2020 2106 Full Code [id number]  Art[place], MD ED   04/07/2018 2206 04/11/2018 1552 Full Code [id number]  [name], MD Inpatient      Advance Directive Documentation    Flowsheet Row Most Recent Value  Type of Advance Directive Healthcare Power of Attorney  Pre-existing out of facility DNR order (yellow form or pink MOST form) --  "MOST" Form in Place? --       Prognosis:  Guarded   Discharge Planning: [place] for rehab with Palliative care service follow-up  Care plan was discussed with patient, son and brother.   Thank you for allowing the Palliative Medicine Team to assist in the care of this patient.  Total time: 25 minutes  Micheline Rough, MD  Please contact Palliative Medicine Team phone at [phone] for questions and concerns.

## 2021-12-30 NOTE — Progress Notes (Signed)
PROGRESS NOTE    Derek Anon Hemstreet Jr.  CZY:[id number] DOB: 12/14/1950 DOA: 12/18/2021 PCP: [name], FNP   Brief Narrative:  The patient is a 71 year old M with PMH of EtOH cirrhosis, nonimmune hemolytic anemia, pancytopenia, right AKA and hepatitis C brought to ED with altered mental status after he slid off his bed and found down on the ground by family, and admitted to ICU with hypovolemic shock, hemolytic anemia, liver failure, encephalopathy, AKI and hyperkalemia.  In ED, hypotensive and altered.  Hgb 4.0.  Lactic acid> 9.  AST 495.  ALT 107.  Bili 8.  He was started on Levophed, IV antibiotics and blood transfusion. Hematology consulted. Patient came off Levophed and transferred to [facility] hospitalist service on 5/24.    RUQ [place] concerning for liver cirrhosis and liver mass. 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  Liver cirrhosis, portal HTN, splenomegaly, ascites. General surgery consulted and recommended conservative care with IV antibiotics.   5/26-patient was on IV fluid for AKI and rhabdomyolysis but developed significant scrotal edema.  AKI resolved.  IV fluid discontinued.  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  He also had bloody stool later in the day.  Patient was moved to ICU.  PCCM, general surgery and IR consulted.  Antibiotics broadened.  527-transfused a unit of FFP and 2 units of PRBC.  Had percutaneous cholecystostomy tube.  Orthopedic surgery and GI consulted, and did not feel surgical intervention is indicated.  528-Transferred to progressive  care.  Started on p.o. Seroquel for delirium.  De-escalated antibiotics to IV Unasyn based on biliary culture.  No further GI bleed but blood counts dropped likely due to hemolytic anemia and he was transfused another 1 unit PRBCs on 12/26/2021 early in the AM.    12/27/21 Hemoglobin/hematocrit seems to have stabilized now.  He is still complaining of some leg pain so will be discussed with Ortho.  General surgery has now signed off the case and they are making a referral to [facility] for surgeon/hepatologist there to help address all of his liver issues.  IR evaluated and recommending continuing the [place] catheter with 3 times daily flushes with 5 cc of normal saline acute guarding output every shift and following up in 6 to 8 weeks for replacement or for repeat imaging/possible drain injection.  5/31-6/1: Vascular surgery evaluated and he underwent a CTA of the abdomen and pelvis with bilateral runoff and Dr. Trula Slade reviewed with the radiologist and they feel his vascular disease likely result of a high-grade right common femoral artery stenosis and because of his multiple comorbidities including cirrhosis and thrombocytopenia he is not an operative candidate and he feels that they would not stent his common femoral artery given the comorbidities.  Orthopedic surgery evaluated his new CT angio and they feel that he still not a surgical candidate at this time given that he is clinically stable and slightly improving.  They feel that if his symptoms progress into florid infection and he requires surgery would be a radical series of debridements of the left thigh or left hip disarticulation which would be extremely morbid and associated with a very  high perioperative mortality.  Palliative care was consulted for further pain assistance and management and patient is improving but overnight from 6/1 till 6/2 he spiked a temperature of 101.  He also developed a diffuse body rash and questionable drug reaction.   Given his temperature and rash ID was consulted and he was otherwise hemodynamically stable.  Patient no localizing infectious syndrome currently and he was started on fluconazole for Candida in his bile.  There is no sign of pneumonia or GU infection and he had this diffuse maculopapular erythematous rash on the trunk and arms which appear to be likely drug rash so ID discontinued his Unasyn and recommending continuing fluconazole for Candida in the drain until 01/03/2022.  ID recommended that the rash may get worse but should stabilize early next week and recommended considering anemia SBP given his cirrhosis status so we will obtain a ultrasound of the abdomen.  Blood cultures x2 were obtained and these will need to be followed but patient will follow-up with general surgeon outpatient setting.  12/30/2021 patient started itching so given some Benadryl and his rash is a little worse so we will start him on IV Solu-Medrol 60 mg every 12.  He is also appeared volume overloaded so we will give him IV diuresis with IV Lasix with milligrams twice daily.  Patient was noted to be 7 L positive since admission in the setting of his cirrhosis.  We will continue diuresis for few days now and monitor his rash   Assessment and Plan: * Septic shock ([place]) -Patient was Tachypneic, hypotensive with AKI, lactic acidosis and thrombocytopenia, and required vasopressors on admission.   -Imaging raises concern for calculus cholecystitis, possible cholecystocolonic fistula left thigh muscle necrosis versus neck fasciitis.   -Blood and urine cultures NGTD.  Biliary culture with pansensitive Enterococcus faecalis and faecium as well as lactobacillus species.   -Hemodynamically stable.  No leukocytosis and mildly leukopenic  -High risk for surgical intervention -Percutaneous cholecystostomy tube by IR on 5/27 -CTX and Flagyl >>>Vanc, Cefepime and Flagyl 5/27>>> IV Unasyn 5/30 -> 12/29/21 -Patient spiked a temperature overnight  and has diffuse maculopapular erythematous rash and likely a drug rash so his Unasyn is now been discontinued -ID has been consulted and appreciate further evaluation and recommending to evaluate for SBP and feel that the rash and the fever could be related to a drug rash so they recommended stopping Unasyn and recommended continuing fluconazole for Candida in his drain culture for 5 days -We will need to watch his rash carefully and ID feels it can get worse as we can but if it remains stable or improved next week can be discharged early -General surgery sent referral to [facility] for outpatient follow-up -Discharge plan in place from IR standpoint -Blood cultures x2 reordered and showing NGTD <24 hours -Tolerating soft diet and likley to D/C to SNF once rash improves and once we know that he is not having any more fevers   Acute pain of left thigh/rhabdomyolysis/myonecrosis -CT abdomen and pelvis shows muscular swelling with tracking gas within the proximal muscle compartments likely myonecrosis from traumatic rhabdomyolysis versus neck fasciitis.   -CK improving. -Orthopedic surgery signed off but will have them re-evaluate and they feel he is not a surgical candidate regardless but agree with Vascular Evaluation -MRI leg has been ordered for further investigation but will discontinue this given his repeat CTA with bilateral runoff; See Above MRI discontinued as it won't change management  -Continue monitoring closely   SVT (supraventricular  tachycardia) ([place]) -One episode on 5/26.   -As symptomatic.  Normotensive.  Resolved with IV metoprolol. -C/w IV metoprolol as needed -Continue telemetry monitoring while hospitalized   Acute metabolic encephalopathy -Initially, patient was in septic shock with uremia and some degree of hepatic encephalopathy.  -Now he was having some delirium.  He is at risk for polypharmacy as well.  Delirium improved after low-dose Seroquel at night. -Seroquel 25 mg HS  and IV Haldol 2 mg q6h PRN-risk and benefit discussed with sons.  QTc 432. -Continue to Treat treatable causes -Minimize sedating medications -Reorientation and delirium precautions.  AKI (acute kidney injury) ([place]) Recent Labs    12/21/21 0244 12/22/21 0413 12/22/21 2345 12/24/21 0309 12/25/21 7106 12/26/21 0310 12/27/21 0411 12/28/21 0401 12/29/21 0357 12/30/21 0433  BUN 42* '22 16 15 17 16 16 14 20 '$ 30*  CREATININE 1.11 0.61 0.55* 0.65 0.56* 0.41* 0.46* 0.48* 0.55* 0.63  -Likely due to hypotension and rhabdomyolysis.  Resolved and improved. -Recheck renal function in the morning; renal function is much improved at 30/0.63 -BUN is elevated and it went to 30 and will need to continue monitor; will give steroid so this may worsen but will need to evaluate for upper GI bleed as well -Avoid nephrotoxic medications, contrast dyes, hypotension and dehydration to ensure adequate renal perfusion and renally adjust medications -Repeat CMP in the a.m.   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 Elevated liver enzymes/hyperbilirubinemia; As Above Recent Labs  Lab 12/26/21 0310 12/27/21 0411 12/28/21 0401 12/29/21 0357 12/30/21 0433  AST 111* 86* 83* 76* 71*  ALT 89* 70* 57* 46* 41  ALKPHOS 47 55 58 51 54  BILITOT 1.3* 1.1 0.9 1.6* 1.9*  PROT 4.7* 4.8* 4.8* 4.6* 4.8*  ALBUMIN 1.8* 1.8* 1.8* 1.7* 1.7*  LFT pattern consistent rhabdo.  Rhabdo improving.  He has mild coagulopathy as well. 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 LDH markedly elevated.  Haptoglobin low.  Had a splenomegaly  and splenic infarction.  Had frank red blood in his stool on 5/26 but no further bleed after FFP and platelet transfusion. EGD 5/22 with gastritis and duodenitis.  Colo in 5/22 with polyps, diverticulosis and internal hemorrhoid.  -Transfused a total of 6 units PRBC, 2 apheresis of platelet and 1 FFP so far. -Hgb 6.6 the day before yesterday so he is transfused another 1 unit of PRBCs -[facility] signed off. -Monitor H&H and transfuse for Hgb <7.0.  -Now hemoglobin/hematocrit is improved and trend has gone from 8.9/28.6  -> 8.1/25.2 -> 8.4/26.1 now dropped to 7.1/22.3 -Repeat CBC in the AM   Splenic infarction -Wedge-shaped area of hypoenhancement involving the anterior spleen concerning for splenic infarction noted on CT abdomen and pelvis. Repeat CTA shows stable lesion  -Per General Surgery. -They are planning to refer him out to [facility] for further issues; could be contributing to his thrombocytopenia  Left leg weakness and numbness -Likely from rhabdomyolysis/myonecrosis of left thigh. ABI with severe PAD and showed Resting left ankle-brachial index indicates severe left lower  extremity arterial disease. The left toe-brachial index is absent.  -He may need repeat  ABI once he recovers -Closely monitor discussed with vascular surgery Dr. Trula Slade who will see the patient later this evening for  Further evaluation and recommendations -Orthopedic Surgery following and the numbness could be related to the myonecrosis however after discussion with the vascular surgery team they are requesting Neurology input; after discussion with neurology they are recommending obtaining MRI of the leg as well as MRI of the L-spine with and without contrast and have the orthopedic evaluate for possible compartment syndrome; patient was to undergo MRIs however vascular evaluated and recommended getting a CT of the abdomen pelvis bilateral runoff; given that an MRI would not change his  management will discontinue the MRIs -I spoke with Dr. Kathaleen Bury and he feels the patient has baseline numbness in that leg with myonecrosis of thigh from being found down and he would not be a surgical candidate to release any compartments as it is well over 48 hours since onset of being found down and has nothing else to offer from his standpoint but agrees with Vascular Evaluation  -Per my discussion with Dr. Kathaleen Bury even if his MRI shows some hematomas he is likely not a surgical candidate given that cannot heal incisions and would be exceptionally high risk for infection and he feels the left thigh has been stable for the past week and the CT intra-muscular gas was incidentally identified on CT pelvis. -Dr. Kathaleen Bury feels that the patient has essentially no function of that LLE for past 2 years -Orthopedic surgery evaluated and evaluated his repeat CTA and they still feel that he is still not a surgical candidate and they feel that if his symptoms progressed to forward infection and he require surgery it would be likely be a radical serial debridement of the left thigh or left hip disarticulation which will either be extremely morbid associated with very high perioperative mortality -Vascular surgery evaluated and they feel that his vascular disease is likely the result of a very high-grade right common femoral artery stenosis but because of his multiple comorbidities including cirrhosis and thrombocytopenia is not a current operative candidate and they would not recommend stenting of common femoral artery at this time  Hyperkalemia, hyponatremia and hypomagnesemia Hyperkalemia mild and is now at 5.2 so we will give a dose of Lokelma.   Hyponatremia resolved; last sodium went from 137 is now 135 -> 134 and trended down to 130 -Patient's Mag Level is now 1.8 again -Continue to monitor and replete as necessary; -Repeat CMP, mag, Phos in the AM   Thrombocytopenia ([place]) -Patient's Platelet  Count went from 25 -> 25 -> 27 -> 22 -> 25 -> 28 -> 24 -> 38 -> 37 -Had 2 apheresis of platelet on 5/27. -Transfuse for platelet < 10k or bleeding -Continue to Monitor for S/Sx of Bleeding; No overt bleeding noted  -Repeat CBC in the AM   Goals of care, counseling/discussion -Remains full code with full scope of care.  See discussion on 5/26 and 5/27 -Palliative medicine following and appreciate further evaluation   Abnormal MRI, liver-resolved as of 12/23/2021 -RUQ [place] concerning for 6 cm liver mass.  Not appreciated on CT abdomen with contrast. -Follow up in the outpatient with GI and General Surgery  Physical deconditioning -Wheelchair-bound at baseline.  He says he is able to transfer using left leg at baseline. -PT/OT recommended SNF -Left leg evaluation as above   Scrotal edema -Likely due in the setting of liver cirrhosis, hypoalbuminemia and IVF hydration.  -IV fluid discontinued.   -Start  Diuresis given Volume Overload  -Continue monitoring and will need Scrotal Sling and Scrotal Support -May discuss with Urology for further outpatient workup   Volume overload - Multifactorial in setting of blood transfusions, IV fluid hydration as well as hypoalbuminemia -Recent echocardiogram done showed an EF of 55 to 60% with grade 1 diastolic dysfunction so he could have a CHF component -Check BNP in the morning -Initiate IV Lasix 40 mg every 12 -Strict I's and O's and daily weights -Continue to monitor for signs and symptoms of volume overload and repeat chest x-ray in the a.m. -May consider right upper quadrant ultrasound in the setting of his liver cirrhosis to evaluate for SBP   Drug rash - See above we will stop his Unasyn now -Added hydrocortisone cream but given that his rash worsen will start systemic steroids with Solu-Medrol 60 mg every 12 -Rash worsened and developed significantly after fentanyl patch was placed and this is unlikely the cause but given that this is  something you elevated will hold and stop fentanyl patch -Likely a drug reaction though not related to the fentanyl patch -Continue to monitor and patient is itching so he was initiated on Benadryl  Elevated troponin -Likely demand ischemia in the setting of sepsis and shock most likely in the setting of rhabdomyolysis -Denies any current chest pain  Calculus of bile duct with acute cholecystitis -See shock  Hyponatremia - Mild and now sodium went from 137 and dropped to 134 -> 130 -[place] the patient is hypervolemic hyponatremic so we will start diuresis -Continue to monitor and trend and repeat CMP in a.m.  Traumatic rhabdomyolysis ([place]) -CK was improving and went from 10,683 at its peak and is now 1873 -> 2149 -> 1737 -> 1270  Hyperbilirubinemia -Improving. T Bili went from 8.8 -> 0.9 slightly worsened and is now 1.6 yesterday and today is now 1.9 and possibly in the setting of hemolytic anemia and -Continue to Monitor and Trend -Repeat CMP in the AM    Elevated liver enzymes - Improving significantly; patient's AST was 2284 and ALT was 545 and now trended down; AST is now 71 and ALT is now 85 -He is status post percutaneous cholecystostomy tube -Continue monitor and trend LFTs carefully  Chronic pain syndrome -C/w Oxycodone to 20 mg every 6 hours.  Per database, fills 120 for 30 days -Palliative care consulted for further goals of care and pain adjustment Fentanyl Patch was started but stopped given Rash  -Discontinue IV Dilaudid -Continue pain control  Hemolytic anemia ([facility]) -In the setting of Liver Cirrhosis -Patient's hemoglobin/hematocrit did drop to 6.6/90.9 and he was transfused 1 unit PRBCs and then elevated to 10.5/31.9.  Repeat this morning was 8.2/25.2 and now hemoglobin/hematocrit is 8.9/28.6 -> 8.1/25.2 and is now 8.4/26.1 and slightly dropped to 7.1/22.3 -Continue monitor and trend and continue monitor for signs or symptoms of bleeding; no overt bleeding  noted -May need some more blood transfusions but have initiated Lasix and prednisone -Repeat CBC in a.m.   Anxiety -Patient is adamant about his Xanax despite risk for polypharmacy and encephalopathy. -Resumed Alprazolam 1 mg po TIDprn Anxiety   DVT prophylaxis: SCDs Start: 12/18/21 1421    Code Status: Full Code Family Communication: Discussed with son at bedside  Disposition Plan:  Level of care: Progressive Status is: Inpatient Remains inpatient appropriate because: Continues to have significant drug rash and appears volume overloaded and more puffy today so we will start diuresis   Consultants:  PCCM Transfer General Surgery Orthopedic  Surgery Oncology  Vascular Surgery Palliative Care Medicine Gastroenterology Interventional Radiology Infectious Diseases  Discussed with Neurology  Procedures:  ABI MRI Liver CT Abd/Pelvis Procedure: 10.2 fr Cholecystostomy drain done by Dr. Sharen Heck Mir Repeat CTA Abd/Pelvis Ordering an ultrasound ascites  Antimicrobials:  Anti-infectives (From admission, onward)    Start     Dose/Rate Route Frequency Ordered Stop   12/29/21 1000  fluconazole (DIFLUCAN) IVPB 400 mg        400 mg 100 mL/hr over 120 Minutes Intravenous Every 24 hours 12/29/21 0918 01/03/22 0959   12/26/21 0800  Ampicillin-Sulbactam (UNASYN) 3 g in sodium chloride 0.9 % 100 mL IVPB  Status:  Discontinued        3 g 200 mL/hr over 30 Minutes Intravenous Every 6 hours 12/26/21 0711 12/29/21 1204   12/23/21 1100  vancomycin (VANCOREADY) IVPB 1250 mg/250 mL  Status:  Discontinued        1,250 mg 166.7 mL/hr over 90 Minutes Intravenous Every 12 hours 12/23/21 0922 12/26/21 0703   12/23/21 1000  ceFEPIme (MAXIPIME) 2 g in sodium chloride 0.9 % 100 mL IVPB  Status:  Discontinued        2 g 200 mL/hr over 30 Minutes Intravenous Every 8 hours 12/23/21 0922 12/26/21 0703   12/22/21 2048  metroNIDAZOLE (FLAGYL) IVPB 500 mg  Status:  Discontinued        500 mg 100 mL/hr  over 60 Minutes Intravenous Every 12 hours 12/22/21 2049 12/26/21 0703   12/21/21 1100  cefTRIAXone (ROCEPHIN) 2 g in sodium chloride 0.9 % 100 mL IVPB  Status:  Discontinued        2 g 200 mL/hr over 30 Minutes Intravenous Every 24 hours 12/21/21 0900 12/23/21 0904   12/20/21 1830  metroNIDAZOLE (FLAGYL) IVPB 500 mg  Status:  Discontinued        500 mg 100 mL/hr over 60 Minutes Intravenous Every 12 hours 12/20/21 1727 12/22/21 2049   12/20/21 1100  cefTRIAXone (ROCEPHIN) 1 g in sodium chloride 0.9 % 100 mL IVPB  Status:  Discontinued        1 g 200 mL/hr over 30 Minutes Intravenous Every 24 hours 12/20/21 1006 12/21/21 0900   12/19/21 2000  vancomycin (VANCOREADY) IVPB 1250 mg/250 mL  Status:  Discontinued        1,250 mg 166.7 mL/hr over 90 Minutes Intravenous Every 24 hours 12/18/21 1946 12/19/21 1207   12/19/21 1100  ceFEPIme (MAXIPIME) 2 g in sodium chloride 0.9 % 100 mL IVPB  Status:  Discontinued        2 g 200 mL/hr over 30 Minutes Intravenous Every 12 hours 12/19/21 1038 12/20/21 1006   12/19/21 0600  ceFEPIme (MAXIPIME) 2 g in sodium chloride 0.9 % 100 mL IVPB  Status:  Discontinued        2 g 200 mL/hr over 30 Minutes Intravenous Every 12 hours 12/18/21 1943 12/19/21 1038   12/18/21 1845  vancomycin (VANCOREADY) IVPB 2000 mg/400 mL        2,000 mg 200 mL/hr over 120 Minutes Intravenous STAT 12/18/21 1828 12/18/21 2151   12/18/21 1830  ceFEPIme (MAXIPIME) 2 g in sodium chloride 0.9 % 100 mL IVPB        2 g 200 mL/hr over 30 Minutes Intravenous STAT 12/18/21 1818 12/18/21 1951       Subjective: Seen and examined at bedside and his drug rash was little worse and he was itching.  Was little bit more sleepy and somnolent this morning.  Denies any chest pain or shortness of breath but feels more puffy.  Keeping leg elevated.  No nausea or vomiting.  Denies any lightheadedness or dizziness.  No other concerns or plaints this time  Objective: Vitals:   12/30/21 0500 12/30/21 0505  12/30/21 1238 12/30/21 1409  BP:  (!) 111/59 (!) 106/53   Pulse:  96 85   Resp:   16   Temp:  98.7 F (37.1 C) 99 F (37.2 C) 98.1 F (36.7 C)  TempSrc:  Oral Oral Oral  SpO2:  97% 95%   Weight: 110.7 kg     Height:        Intake/Output Summary (Last 24 hours) at 12/30/2021 1806 Last data filed at 12/30/2021 1700 Gross per 24 hour  Intake 985 ml  Output 1830 ml  Net -845 ml   Filed Weights   12/28/21 0500 12/29/21 0358 12/30/21 0500  Weight: 109.7 kg 109.8 kg 110.7 kg   Examination: Physical Exam:  Constitutional: WN/WD obese chronically ill-appearing Caucasian male currently no acute distress appears a little sleepy Respiratory: Diminished to auscultation bilaterally with coarse breath sounds, no wheezing, rales, rhonchi or crackles. Normal respiratory effort and patient is not tachypenic. No accessory muscle use.  Unlabored breathing Cardiovascular: RRR, no murmurs / rubs / gallops. S1 and S2 auscultated.  Has 1-2+ lower extremity edema Abdomen: Soft, non-tender, distended secondary body habitus and has a PERC cholecystostomy drain in place. Bowel sounds positive.  GU: Deferred.  Has a Primofit on  Musculoskeletal: Has a right AKA Skin: Has a diffuse body rash that is maculopapular and erythematous and now is itchy  Neuro: Cranial nerves II through XII grossly intact Psychiatric: Normal judgment and insight. Alert and oriented x 3.   Data Reviewed: I have personally reviewed following labs and imaging studies  CBC: Recent Labs  Lab 12/27/21 0411 12/27/21 1249 12/28/21 0401 12/29/21 0357 12/30/21 0433  WBC 3.2* 3.9* 4.0 9.1 7.0  NEUTROABS  --  2.9 3.0 7.7 5.4  HGB 8.2* 8.9* 8.1* 8.4* 7.1*  HCT 25.2* 28.6* 25.2* 26.1* 22.3*  MCV 96.9 98.6 97.7 97.0 99.6  PLT 25* 28* 24* 38* 37*   Basic Metabolic Panel: Recent Labs  Lab 12/25/21 0317 12/26/21 0310 12/27/21 0411 12/28/21 0401 12/29/21 0357 12/30/21 0433  NA 134* 134* 137 135 134* 130*  K 3.7 3.6 3.6 3.7 4.9  5.2*  CL 104 104 106 103 104 98  CO2 '24 24 24 24 25 25  '$ GLUCOSE 138* 118* 130* 160* 156* 121*  BUN '17 16 16 14 20 '$ 30*  CREATININE 0.56* 0.41* 0.46* 0.48* 0.55* 0.63  CALCIUM 7.8* 7.5* 7.8* 7.7* 7.8* 7.9*  MG 1.4* 1.6* 1.6* 1.6* 1.8 1.8  PHOS 3.2 3.4  --  3.3 4.1 4.7*   GFR: Estimated Creatinine Clearance: 107.2 mL/min (by C-G formula based on SCr of 0.63 mg/dL). Liver Function Tests: Recent Labs  Lab 12/26/21 0310 12/27/21 0411 12/28/21 0401 12/29/21 0357 12/30/21 0433  AST 111* 86* 83* 76* 71*  ALT 89* 70* 57* 46* 41  ALKPHOS 47 55 58 51 54  BILITOT 1.3* 1.1 0.9 1.6* 1.9*  PROT 4.7* 4.8* 4.8* 4.6* 4.8*  ALBUMIN 1.8* 1.8* 1.8* 1.7* 1.7*   No results for input(s): LIPASE, AMYLASE in the last 168 hours. Recent Labs  Lab 12/24/21 0309 12/25/21 0317 12/26/21 0704 12/27/21 0411  AMMONIA 21 33 32 32   Coagulation Profile: Recent Labs  Lab 12/24/21 0309 12/25/21 0317 12/26/21 0310 12/27/21 0411  INR 1.3*  1.4* 1.4* 1.4*   Cardiac Enzymes: Recent Labs  Lab 12/26/21 0310 12/27/21 0411 12/28/21 0401 12/29/21 0357 12/30/21 0433  CKTOTAL 2,204* 1,873* 2,149* 1,737* 1,270*   BNP (last 3 results) No results for input(s): PROBNP in the last 8760 hours. HbA1C: No results for input(s): HGBA1C in the last 72 hours. CBG: No results for input(s): GLUCAP in the last 168 hours. Lipid Profile: No results for input(s): CHOL, HDL, LDLCALC, TRIG, CHOLHDL, LDLDIRECT in the last 72 hours. Thyroid Function Tests: No results for input(s): TSH, T4TOTAL, FREET4, T3FREE, THYROIDAB in the last 72 hours. Anemia Panel: No results for input(s): VITAMINB12, FOLATE, FERRITIN, TIBC, IRON, RETICCTPCT in the last 72 hours. Sepsis Labs: No results for input(s): PROCALCITON, LATICACIDVEN in the last 168 hours.  Recent Results (from the past 240 hour(s))  Aerobic/Anaerobic Culture w Gram Stain (surgical/deep wound)     Status: None   Collection Time: 12/23/21 11:58 AM   Specimen: BILE   Result Value Ref Range Status   Specimen Description   Final    BILE Performed at [facility] [address], [place], [place] [address]    Special Requests   Final    NONE Performed at [facility], [place] [address] [place], [place] [address]    Gram Stain   Final    ABUNDANT WBC PRESENT, PREDOMINANTLY MONONUCLEAR MODERATE GRAM POSITIVE COCCI IN PAIRS Performed at [facility], [place] [address], [place], [place] [address]    Culture   Final    MODERATE ENTEROCOCCUS FAECALIS MODERATE ENTEROCOCCUS FAECIUM ABUNDANT LACTOBACILLUS SPECIES Standardized susceptibility testing for this organism is not available. FEW CANDIDA ALBICANS NO ANAEROBES ISOLATED; CULTURE IN PROGRESS FOR 5 DAYS    Report Status 12/28/2021 FINAL  Final   Organism ID, Bacteria ENTEROCOCCUS FAECALIS  Final   Organism ID, Bacteria ENTEROCOCCUS FAECIUM  Final      Susceptibility   Enterococcus faecalis - MIC*    AMPICILLIN <=2 SENSITIVE Sensitive     VANCOMYCIN 2 SENSITIVE Sensitive     GENTAMICIN SYNERGY SENSITIVE Sensitive     * MODERATE ENTEROCOCCUS FAECALIS   Enterococcus faecium - MIC*    AMPICILLIN <=2 SENSITIVE Sensitive     VANCOMYCIN <=0.5 SENSITIVE Sensitive     GENTAMICIN SYNERGY SENSITIVE Sensitive     * MODERATE ENTEROCOCCUS FAECIUM  Culture, blood (Routine X 2) w Reflex to ID Panel     Status: None   Collection Time: 12/23/21  2:48 PM   Specimen: BLOOD  Result Value Ref Range Status   Specimen Description   Final    BLOOD LEFT ANTECUBITAL Performed at [facility], [place] [address], [place], [place] [address]    Special Requests   Final    NONE Performed at [facility], [place] [address], [place], [place] [address]    Culture   Final    NO GROWTH 5 DAYS Performed at [facility], [place] [address] [place], [place] [address]    Report Status 12/28/2021 FINAL  Final  Culture, blood (Routine X 2) w Reflex to ID Panel      Status: None   Collection Time: 12/23/21  2:48 PM   Specimen: BLOOD  Result Value Ref Range Status   Specimen Description   Final    BLOOD RIGHT ANTECUBITAL Performed at [facility], [place] [address], [place], [place] [address]    Special Requests   Final    NONE Performed at [facility]  [place] [address] [place], [place] [address]    Culture   Final    NO GROWTH 5 DAYS Performed at [facility], [place] [address] [place], [place] [address]    Report Status 12/28/2021 FINAL  Final  Culture, blood (Routine X 2) w Reflex to ID Panel     Status: None (Preliminary result)   Collection Time: 12/29/21  8:50 AM   Specimen: Bronchial Wash; Blood  Result Value Ref Range Status   Specimen Description   Final    BRONCHIAL WASHINGS Performed at [facility] [address] [place], [place] [address]    Special Requests   Final    BOTTLES DRAWN AEROBIC AND ANAEROBIC Blood Culture adequate volume Performed at [facility] [address], [place], [place] [address]    Culture   Final    NO GROWTH < 24 HOURS Performed at [facility] [address], [place], [place] [address]    Report Status PENDING  Incomplete  Culture, blood (Routine X 2) w Reflex to ID Panel     Status: None (Preliminary result)   Collection Time: 12/29/21  9:00 AM   Specimen: BLOOD LEFT HAND  Result Value Ref Range Status   Specimen Description   Final    BLOOD LEFT HAND Performed at [facility] [address], [place], [place] [address]    Special Requests   Final    BOTTLES DRAWN AEROBIC AND ANAEROBIC Blood Culture adequate volume Performed at [facility] [address], [place], [place] [address]    Culture   Final    NO GROWTH < 24 HOURS Performed at [facility] [address] [place], [place] [address]    Report Status PENDING  Incomplete     Radiology Studies: No results  found.   Scheduled Meds:  (feeding supplement) PROSource Plus  30 mL Oral Daily   feeding supplement  237 mL Oral BID BM   furosemide  40 mg Intravenous BID   hydrocortisone cream   Topical TID   lactulose  20 g Oral BID   levothyroxine  50 mcg Oral Q0600   magnesium oxide  400 mg Oral BID   mouth rinse  15 mL Mouth Rinse BID   methylPREDNISolone (SOLU-MEDROL) injection  60 mg Intravenous Q12H   multivitamin with minerals  1 tablet Oral Daily   pantoprazole  40 mg Oral BID   QUEtiapine  25 mg Oral QHS   sodium chloride flush  5 mL Intracatheter Q8H   Continuous Infusions:  sodium chloride Stopped (12/23/21 0547)   fluconazole (DIFLUCAN) IV 400 mg (12/30/21 1014)    LOS: 12 days   Raiford Noble, DO [facility] Available via Epic secure chat 7am-7pm After these hours, please refer to coverage provider listed on [email or web address] 12/30/2021, 6:06 PM

## 2021-12-30 NOTE — Assessment & Plan Note (Addendum)
-   Multifactorial in setting of blood transfusions, IV fluid hydration as well as hypoalbuminemia -Recent echocardiogram done showed an EF of 55 to 60% with grade 1 diastolic dysfunction so he could have a CHF component -Check BNP in the morning -Was initiated on Lasix today titrated IV 80 mg twice daily -Strict I's and O's and daily weights; patient is now -1.957 L since admission -Continue to monitor for signs and symptoms of volume overload and repeat chest x-ray in the a.m. -May consider right upper quadrant ultrasound in the setting of his liver cirrhosis to evaluate for SBP -Repeat chest x-ray today showed "Increased retrocardiac left lower lobe opacity, could represent atelectasis, pneumonia or aspiration. No other focal lung Infiltrate." -Ultrasound abdominal ascites showed "No free fluid is seen within any of the 4 abdominal quadrants" -We will continue with diuresis and he will get another unit of blood so we will diurese

## 2021-12-31 ENCOUNTER — Inpatient Hospital Stay (HOSPITAL_COMMUNITY): Payer: Medicare Other

## 2021-12-31 DIAGNOSIS — Z515 Encounter for palliative care: Secondary | ICD-10-CM | POA: Diagnosis not present

## 2021-12-31 DIAGNOSIS — L270 Generalized skin eruption due to drugs and medicaments taken internally: Secondary | ICD-10-CM | POA: Diagnosis not present

## 2021-12-31 DIAGNOSIS — A419 Sepsis, unspecified organism: Secondary | ICD-10-CM | POA: Diagnosis not present

## 2021-12-31 DIAGNOSIS — G894 Chronic pain syndrome: Secondary | ICD-10-CM | POA: Diagnosis not present

## 2021-12-31 DIAGNOSIS — R400 Somnolence: Secondary | ICD-10-CM

## 2021-12-31 DIAGNOSIS — G9341 Metabolic encephalopathy: Secondary | ICD-10-CM | POA: Diagnosis not present

## 2021-12-31 DIAGNOSIS — R932 Abnormal findings on diagnostic imaging of liver and biliary tract: Secondary | ICD-10-CM | POA: Diagnosis not present

## 2021-12-31 DIAGNOSIS — M79652 Pain in left thigh: Secondary | ICD-10-CM | POA: Diagnosis not present

## 2021-12-31 LAB — CBC WITH DIFFERENTIAL/PLATELET
Abs Immature Granulocytes: 0.11 10*3/uL — ABNORMAL HIGH (ref 0.00–0.07)
Basophils Absolute: 0 10*3/uL (ref 0.0–0.1)
Basophils Relative: 0 %
Eosinophils Absolute: 0 10*3/uL (ref 0.0–0.5)
Eosinophils Relative: 0 %
HCT: 18.7 % — ABNORMAL LOW (ref 39.0–52.0)
Hemoglobin: 6 g/dL — CL (ref 13.0–17.0)
Immature Granulocytes: 4 %
Lymphocytes Relative: 13 %
Lymphs Abs: 0.3 10*3/uL — ABNORMAL LOW (ref 0.7–4.0)
MCH: 31.7 pg (ref 26.0–34.0)
MCHC: 32.1 g/dL (ref 30.0–36.0)
MCV: 98.9 fL (ref 80.0–100.0)
Monocytes Absolute: 0.1 10*3/uL (ref 0.1–1.0)
Monocytes Relative: 2 %
Neutro Abs: 2 10*3/uL (ref 1.7–7.7)
Neutrophils Relative %: 81 %
Platelets: 26 10*3/uL — CL (ref 150–400)
RBC: 1.89 MIL/uL — ABNORMAL LOW (ref 4.22–5.81)
RDW: 17.9 % — ABNORMAL HIGH (ref 11.5–15.5)
WBC: 2.5 10*3/uL — ABNORMAL LOW (ref 4.0–10.5)
nRBC: 0 % (ref 0.0–0.2)

## 2021-12-31 LAB — COMPREHENSIVE METABOLIC PANEL
ALT: 41 U/L (ref 0–44)
AST: 60 U/L — ABNORMAL HIGH (ref 15–41)
Albumin: 1.7 g/dL — ABNORMAL LOW (ref 3.5–5.0)
Alkaline Phosphatase: 77 U/L (ref 38–126)
Anion gap: 8 (ref 5–15)
BUN: 35 mg/dL — ABNORMAL HIGH (ref 8–23)
CO2: 27 mmol/L (ref 22–32)
Calcium: 8.6 mg/dL — ABNORMAL LOW (ref 8.9–10.3)
Chloride: 97 mmol/L — ABNORMAL LOW (ref 98–111)
Creatinine, Ser: 0.57 mg/dL — ABNORMAL LOW (ref 0.61–1.24)
GFR, Estimated: >60 mL/min (ref >60)
Glucose, Bld: 187 mg/dL — ABNORMAL HIGH (ref 70–99)
Potassium: 4.9 mmol/L (ref 3.5–5.1)
Sodium: 132 mmol/L — ABNORMAL LOW (ref 135–145)
Total Bilirubin: 1 mg/dL (ref 0.3–1.2)
Total Protein: 5 g/dL — ABNORMAL LOW (ref 6.5–8.1)

## 2021-12-31 LAB — PHOSPHORUS: Phosphorus: 5.1 mg/dL — ABNORMAL HIGH (ref 2.5–4.6)

## 2021-12-31 LAB — MAGNESIUM: Magnesium: 1.8 mg/dL (ref 1.7–2.4)

## 2021-12-31 LAB — PREPARE RBC (CROSSMATCH)

## 2021-12-31 LAB — CK: Total CK: 700 U/L — ABNORMAL HIGH (ref 49–397)

## 2021-12-31 MED ORDER — GUAIFENESIN ER 600 MG PO TB12
1200.0000 mg | ORAL_TABLET | Freq: Two times a day (BID) | ORAL | Status: DC
  Administered 2021-12-31 – 2022-01-04 (×10): 1200 mg via ORAL
  Filled 2021-12-31 (×10): qty 2

## 2021-12-31 MED ORDER — IPRATROPIUM BROMIDE 0.02 % IN SOLN
0.5000 mg | Freq: Four times a day (QID) | RESPIRATORY_TRACT | Status: DC
  Administered 2021-12-31: 0.5 mg via RESPIRATORY_TRACT
  Filled 2021-12-31: qty 2.5

## 2021-12-31 MED ORDER — LEVALBUTEROL HCL 0.63 MG/3ML IN NEBU
0.6300 mg | INHALATION_SOLUTION | Freq: Four times a day (QID) | RESPIRATORY_TRACT | Status: DC
  Administered 2021-12-31: 0.63 mg via RESPIRATORY_TRACT
  Filled 2021-12-31: qty 3

## 2021-12-31 MED ORDER — LEVALBUTEROL HCL 0.63 MG/3ML IN NEBU
0.6300 mg | INHALATION_SOLUTION | Freq: Three times a day (TID) | RESPIRATORY_TRACT | Status: DC
  Administered 2021-12-31 – 2022-01-01 (×4): 0.63 mg via RESPIRATORY_TRACT
  Filled 2021-12-31 (×4): qty 3

## 2021-12-31 MED ORDER — IPRATROPIUM BROMIDE 0.02 % IN SOLN
0.5000 mg | Freq: Three times a day (TID) | RESPIRATORY_TRACT | Status: DC
  Administered 2021-12-31 – 2022-01-01 (×4): 0.5 mg via RESPIRATORY_TRACT
  Filled 2021-12-31 (×4): qty 2.5

## 2021-12-31 MED ORDER — HYDROCORTISONE 1 % EX CREA
TOPICAL_CREAM | Freq: Three times a day (TID) | CUTANEOUS | Status: DC
  Administered 2021-12-31 – 2022-01-02 (×2): 1 via TOPICAL
  Filled 2021-12-31: qty 454

## 2021-12-31 MED ORDER — SODIUM CHLORIDE 0.9% IV SOLUTION: Freq: Once | INTRAVENOUS | Status: DC

## 2021-12-31 MED ORDER — FUROSEMIDE 10 MG/ML IJ SOLN
60.0000 mg | Freq: Two times a day (BID) | INTRAMUSCULAR | Status: DC
  Administered 2021-12-31 – 2022-01-01 (×2): 60 mg via INTRAVENOUS
  Filled 2021-12-31 (×2): qty 6

## 2021-12-31 NOTE — Progress Notes (Signed)
Daily Progress Note   Patient Name: [name].       Date: 12/31/2021 DOB: [birth year]  Age: 71 y.o. MRN#: [id number] Attending Physician: Kerney Elbe, DO Primary Care Physician: [name], FNP Admit Date: 12/18/2021  Reason for Consultation/Follow-up: Establishing goals of care  Subjective: I stopped by to check in on [name] today.  He appeared to be resting comfortably at the time of my encounter.  I did not wake him as I did not have any significant new information to discuss with him today and his son, [name], at the bedside reports that he has been doing okay from a pain management standpoint.  I talked with [name] about current care plan including initiation of steroids earlier today.  Review of MAR reveals that he has had 6 doses of 20 mg of oxycodone in the last 24 hours.  [name] and I discussed that it still may be in [name] best interest to consider initiation of a long-acting opioid at some point in the end in the future.  Discussed that I do not think this rash is related to the fentanyl patch and that would likely be my recommendation for another trial at some point in the future.  Right now, however, we will continue with current regimen as we know he tolerates it and reports pain has been better controlled.  Length of Stay: 13  Current Medications: Scheduled Meds:   (feeding supplement) PROSource Plus  30 mL Oral Daily   sodium chloride   Intravenous Once   feeding supplement  237 mL Oral BID BM   furosemide  40 mg Intravenous BID   hydrocortisone cream   Topical TID   lactulose  20 g Oral BID   levothyroxine  50 mcg Oral Q0600   magnesium oxide  400 mg Oral BID   mouth rinse  15 mL Mouth Rinse BID   methylPREDNISolone (SOLU-MEDROL) injection   60 mg Intravenous Q12H   multivitamin with minerals  1 tablet Oral Daily   pantoprazole  40 mg Oral BID   QUEtiapine  25 mg Oral QHS   sodium chloride flush  5 mL Intracatheter Q8H    Continuous Infusions:  sodium chloride Stopped (12/23/21 0547)   fluconazole (DIFLUCAN) IV 400 mg (12/30/21 1014)    PRN Meds: sodium  chloride, acetaminophen, ALPRAZolam, diphenhydrAMINE, docusate, haloperidol lactate, metoprolol tartrate, oxyCODONE, polyethylene glycol  Physical Exam         Sleeping in bed Appears chronically ill More edema noted in the upper extremities Right AKA Regular work of breathing  Vital Signs: BP (!) 107/49 (BP Location: Left Arm)   Pulse 82   Temp 98.2 F (36.8 C)   Resp 10   Ht 6' (1.829 m)   Wt 110.7 kg   SpO2 93%   BMI 33.10 kg/m  SpO2: SpO2: 93 % O2 Device: O2 Device: Room Air O2 Flow Rate: O2 Flow Rate (L/min): 2 L/min  Intake/output summary:  Intake/Output Summary (Last 24 hours) at 12/31/2021 0842 Last data filed at 12/31/2021 0546 Gross per 24 hour  Intake 505 ml  Output 1905 ml  Net -1400 ml    LBM: Last BM Date : 12/29/21 Baseline Weight: Weight: 88.8 kg Most recent weight: Weight: 110.7 kg       Palliative Assessment/Data:    Flowsheet Rows    Flowsheet Row Most Recent Value  Intake Tab   Referral Department Hospitalist  Unit at Time of Referral Med/Surg Unit  Palliative Care Primary Diagnosis Sepsis/Infectious Disease  Date Notified 12/28/21  Clinical Assessment   Psychosocial & Spiritual Assessment   Palliative Care Outcomes        Patient Active Problem List   Diagnosis Date Noted   Acute pain of left thigh/rhabdomyolysis/myonecrosis 12/23/2021   Septic shock ([place]) 12/18/2021   SVT (supraventricular tachycardia) ([place]) 12/22/2021   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 48/54/6270   AKI (acute kidney injury) ([place]) 35/00/9381   Acute metabolic encephalopathy 82/99/3716   ABLA due to hemolytic  anemia and GI bleed    Disorder of skeletal system 02/29/2020   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   Left leg weakness and numbness 12/21/2021   Thrombocytopenia (HCC) 12/20/2021   Hyperkalemia, hyponatremia and hypomagnesemia 12/20/2021   Goals of care, counseling/discussion 02/29/2020   Physical deconditioning 02/29/2020   Scrotal edema 12/23/2021   Volume overload 12/30/2021   Cholecystitis    Drug rash    GI bleed 12/23/2021   Calculus of bile duct with acute cholecystitis 12/21/2021   Elevated troponin 12/21/2021   Elevated liver enzymes 12/20/2021   Hyperbilirubinemia 12/20/2021   Traumatic rhabdomyolysis ([place]) 12/20/2021   Hyperkalemia 12/20/2021   Hyponatremia 12/20/2021   Hypomagnesemia 12/16/2020   Gastric nodule    Duodenal erythema    Polyp of colon    Left leg swelling 12/14/2020   Eye trauma 02/29/2020   History of pancytopenia 02/29/2020   Portal venous hypertension ([place]) 02/29/2020   S/P AKA (above knee amputation) unilateral ([place]) 02/29/2020   Hepatic cirrhosis due to chronic hepatitis C infection ([place]) 02/29/2020   HTN (hypertension) 09/14/2019   Hepatic encephalopathy ([place]) 04/07/2018   Osteoarthritis of left knee 02/26/2018   Chronic pain syndrome 11/23/2016   Hypothyroidism 09/25/2016   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   Hepatitis B core antibody positive 05/22/2016   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[place]) 04/14/2015   Cellulitis of right thigh 05/21/2012   Pancytopenia ([place]) 02/26/2012   Encounter for long-term (current) use of other medications 02/26/2012   Screening for prostate cancer 02/26/2012   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  He has had a total of 120 mg in the last 24 hours.  We did initially start him on a fentanyl patch but this was discontinued with the development of a rash.  It does not appear the rash is related to fentanyl patch and I would consider another trial of fentanyl patch at some point in the future. Discussed goals and advance care planning on 6/1.  He remains Full Code/Full Scope.  Introduced MOST form and discussed how to develop plan of care to focus on continuing therapies that would maximize chance of being well enough to return home and limiting therapies not in line with this goal.  His son, [name] is his HCPOA.  I gave him a copy of [facility] Choices for [facility]oving [employer] to review.  Recommend outpatient follow-up to continue conversation based upon his clinical course of the next few weeks with trial of rehab.    Code Status:    Code Status Orders  (From admission, onward)           Start     Ordered   12/18/21 1422  Full code  Continuous        12/18/21 1421           Code Status History     Date Active Date Inactive Code Status Order ID Comments User Context   12/14/2020 1507 12/18/2020 2106 Full Code [id number]  Artist [place], MD ED   04/07/2018 2206 04/11/2018 1552 Full Code [id number]  [name], MD Inpatient      Advance Directive Documentation    Flowsheet Row Most Recent Value  Type of Advance Directive Healthcare Power of Attorney  Pre-existing out of facility DNR order (yellow form or pink MOST form) --  "MOST" Form in Place? --       Prognosis:  Guarded   Discharge Planning: [facility] for rehab with Palliative care service follow-up  Care plan was discussed with son, [name].   Thank you for allowing the Palliative Medicine Team to assist in the care of this patient.  Total time: 20 minutes  Micheline Rough, MD  Please contact Palliative  Medicine Team phone at [phone] for questions and concerns.

## 2021-12-31 NOTE — Progress Notes (Signed)
PROGRESS NOTE    Derek Anon Lofquist Jr.  QVZ:[id number] DOB: [birth year] DOA: 12/18/2021 PCP: [name], FNP   Brief Narrative:  The patient is a 71 year old M with PMH of EtOH cirrhosis, nonimmune hemolytic anemia, pancytopenia, right AKA and hepatitis C brought to ED with altered mental status after he slid off his bed and found down on the ground by family, and admitted to ICU with hypovolemic shock, hemolytic anemia, liver failure, encephalopathy, AKI and hyperkalemia.  In ED, hypotensive and altered.  Hgb 4.0.  Lactic acid> 9.  AST 495.  ALT 107.  Bili 8.  He was started on Levophed, IV antibiotics and blood transfusion. Hematology consulted. Patient came off Levophed and transferred to [facility] hospitalist service on 5/24.    RUQ [place] concerning for liver cirrhosis and liver mass. 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  Liver cirrhosis, portal HTN, splenomegaly, ascites. General surgery consulted and recommended conservative care with IV antibiotics.   5/26-patient was on IV fluid for AKI and rhabdomyolysis but developed significant scrotal edema.  AKI resolved.  IV fluid discontinued.  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  He also had bloody stool later in the day.  Patient was moved to ICU.  PCCM, general surgery and IR consulted.  Antibiotics broadened.  527-transfused a unit of FFP and 2 units of PRBC.  Had percutaneous cholecystostomy tube.  Orthopedic surgery and GI consulted, and did not feel surgical intervention is indicated.  528-Transferred to progressive  care.  Started on p.o. Seroquel for delirium.  De-escalated antibiotics to IV Unasyn based on biliary culture.  No further GI bleed but blood counts dropped likely due to hemolytic anemia and he was transfused another 1 unit PRBCs on 12/26/2021 early in the AM.    12/27/21 Hemoglobin/hematocrit seems to have stabilized now.  He is still complaining of some leg pain so will be discussed with Ortho.  General surgery has now signed off the case and they are making a referral to [facility] for surgeon/hepatologist there to help address all of his liver issues.  IR evaluated and recommending continuing the [place] catheter with 3 times daily flushes with 5 cc of normal saline acute guarding output every shift and following up in 6 to 8 weeks for replacement or for repeat imaging/possible drain injection.  5/31-6/1: Vascular surgery evaluated and he underwent a CTA of the abdomen and pelvis with bilateral runoff and Dr. Trula Slade reviewed with the radiologist and they feel his vascular disease likely result of a high-grade right common femoral artery stenosis and because of his multiple comorbidities including cirrhosis and thrombocytopenia he is not an operative candidate and he feels that they would not stent his common femoral artery given the comorbidities.  Orthopedic surgery evaluated his new CT angio and they feel that he still not a surgical candidate at this time given that he is clinically stable and slightly improving.  They feel that if his symptoms progress into florid infection and he requires surgery would be a radical series of debridements of the left thigh or left hip disarticulation which would be extremely morbid and associated with a very  high perioperative mortality.  Palliative care was consulted for further pain assistance and management and patient is improving but overnight from 6/1 till 6/2 he spiked a temperature of 101.  He also developed a diffuse body rash and questionable drug reaction.   Given his temperature and rash ID was consulted and he was otherwise hemodynamically stable.  Patient no localizing infectious syndrome currently and he was started on fluconazole for Candida in his bile.  There is no sign of pneumonia or GU infection and he had this diffuse maculopapular erythematous rash on the trunk and arms which appear to be likely drug rash so ID discontinued his Unasyn and recommending continuing fluconazole for Candida in the drain until 01/03/2022.  ID recommended that the rash may get worse but should stabilize early next week and recommended considering anemia SBP given his cirrhosis status so we will obtain a ultrasound of the abdomen.  Blood cultures x2 were obtained and these will need to be followed but patient will follow-up with general surgeon outpatient setting.  12/30/2021 patient started itching so given some Benadryl and his rash is a little worse so we will start him on IV Solu-Medrol 60 mg every 12.  He is also appeared volume overloaded so we will give him IV diuresis with IV Lasix with milligrams twice daily.  Patient was noted to be 7 L positive since admission in the setting of his cirrhosis.  We will continue diuresis for few days now and monitor his rash.  12/31/2021.  Remains volume overloaded and blood count dropped likely in the setting of his hemolytic anemia slightly Worsening.  She is 1 unit PRBCs.  We will continue with steroids given his rash but this is improving.  IV diuresis increased to 60 mg twice daily.  Given that he had a slight wet sounding chest we will give him Xopenex and Atrovent as well as a flutter valve and guaifenesin will also check ammonia level given that he is slightly sleepy.    Assessment and Plan: * Septic shock ([place]) -Patient was Tachypneic, hypotensive with AKI, lactic acidosis and thrombocytopenia, and required vasopressors on admission.   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   -Blood and urine cultures NGTD.  Biliary culture with pansensitive Enterococcus faecalis and faecium as well as lactobacillus species.   -Hemodynamically stable.  No leukocytosis and mildly leukopenic  -High risk for surgical intervention -Percutaneous cholecystostomy tube by IR on 5/27 -CTX and Flagyl >>>Vanc, Cefepime and Flagyl 5/27>>> IV Unasyn 5/30 -> 12/29/21 -Patient spiked a temperature overnight and has diffuse maculopapular erythematous rash and likely a drug rash so his Unasyn is now been discontinued -ID has been consulted and appreciate further evaluation and recommending to evaluate for SBP and feel that the rash and the fever could be related to a drug rash so they recommended stopping Unasyn and recommended continuing fluconazole for Candida in his drain culture for 5 days -We will need to watch his rash carefully and ID feels it can get worse as we can but if it remains stable or improved next week can be discharged early -General surgery sent referral to [facility] for outpatient follow-up -Discharge plan in place from IR standpoint -Blood cultures x2 reordered and showing NGTD at 2 Days  -Tolerating soft diet and likley to D/C to SNF once rash improves and once we know that he is not having any more fevers   Acute pain of left thigh/rhabdomyolysis/myonecrosis -  CT abdomen and pelvis shows muscular swelling with tracking gas within the proximal muscle compartments likely myonecrosis from traumatic rhabdomyolysis versus neck fasciitis.   -CK improving. -Orthopedic surgery signed off but will have them re-evaluate and they feel he is not a surgical candidate regardless but agree with Vascular Evaluation -MRI leg has been ordered for further investigation but will discontinue this given his repeat CTA with bilateral runoff; See Above MRI discontinued as it won't change management  -Continue monitoring closely   SVT (supraventricular  tachycardia) ([place]) -One episode on 5/26.   -As symptomatic.  Normotensive.  Resolved with IV metoprolol. -C/w IV metoprolol as needed -Continue telemetry monitoring while hospitalized   Acute metabolic encephalopathy -Initially, patient was in septic shock with uremia and some degree of hepatic encephalopathy.  -Now he was having some delirium.  He is at risk for polypharmacy as well.  Delirium improved after low-dose Seroquel at night. -Seroquel 25 mg HS and IV Haldol 2 mg q6h PRN-risk and benefit discussed with sons.  QTc 432. -Continue to Treat treatable causes -Minimize sedating medications -Reorientation and delirium precautions.  AKI (acute kidney injury) ([place]) Recent Labs    12/21/21 0244 12/22/21 0413 12/22/21 2345 12/24/21 0309 12/25/21 0962 12/26/21 0310 12/27/21 0411 12/28/21 0401 12/29/21 0357 12/30/21 0433  BUN 42* '22 16 15 17 16 16 14 20 '$ 30*  CREATININE 1.11 0.61 0.55* 0.65 0.56* 0.41* 0.46* 0.48* 0.55* 0.63  -Likely due to hypotension and rhabdomyolysis.  Resolved and improved. -Recheck renal function in the morning; renal function is much improved at 30/0.63 -BUN is elevated and it went to 30 and will need to continue monitor; will give steroid so this may worsen but will need to evaluate for upper GI bleed as well -Avoid nephrotoxic medications, contrast dyes, hypotension and dehydration to ensure adequate renal perfusion and renally adjust medications -Repeat CMP in the a.m.   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 Elevated liver enzymes/hyperbilirubinemia; As Above Recent Labs  Lab 12/27/21 0411 12/28/21 0401 12/29/21 0357 12/30/21 0433 12/31/21 0655  AST 86* 83* 76* 71* 60*  ALT 70* 57* 46* 41 41  ALKPHOS 55 58 51 54 77  BILITOT 1.1 0.9 1.6* 1.9* 1.0  PROT 4.8* 4.8* 4.6* 4.8* 5.0*  ALBUMIN 1.8* 1.8* 1.7* 1.7* 1.7*  LFT pattern consistent rhabdo.  Rhabdo improving.  He has mild coagulopathy as well. 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 LDH markedly elevated.  Haptoglobin low.  Had a splenomegaly and splenic infarction.  Had frank red blood in his stool on 5/26 but no further bleed after FFP and platelet transfusion. EGD 5/22 with gastritis and duodenitis.  Colo in 5/22 with polyps, diverticulosis and internal hemorrhoid.  -Transfused a total of 6 units PRBC, 2 apheresis of platelet and 1 FFP so far. -Hgb 6.6 the day before yesterday so he is transfused another 1 unit of PRBCs -[facility] signed off. -Monitor H&H and transfuse for Hgb <7.0.  -Now hemoglobin/hematocrit is improved and trend has gone from 8.9/28.6  -> 8.1/25.2 -> 8.4/26.1 now dropped to 7.1/22.3 -Repeat CBC in the AM   Splenic infarction -Wedge-shaped area  of hypoenhancement involving the anterior spleen concerning for splenic infarction noted on CT abdomen and pelvis. Repeat CTA shows stable lesion  -Per General Surgery. -They are planning to refer him out to [facility] for further issues; could be contributing to his thrombocytopenia  Left leg weakness and numbness -Likely from rhabdomyolysis/myonecrosis of left thigh. ABI with severe PAD and showed Resting left ankle-brachial index indicates severe left lower  extremity arterial disease. The left toe-brachial index is absent.  -He may need repeat ABI once he recovers -Closely monitor discussed with vascular surgery Dr. Trula Slade who will see the patient later this evening for  Further evaluation and recommendations -Orthopedic Surgery following and the numbness could be related to the  myonecrosis however after discussion with the vascular surgery team they are requesting Neurology input; after discussion with neurology they are recommending obtaining MRI of the leg as well as MRI of the L-spine with and without contrast and have the orthopedic evaluate for possible compartment syndrome; patient was to undergo MRIs however vascular evaluated and recommended getting a CT of the abdomen pelvis bilateral runoff; given that an MRI would not change his management will discontinue the MRIs -I spoke with Dr. Kathaleen Bury and he feels the patient has baseline numbness in that leg with myonecrosis of thigh from being found down and he would not be a surgical candidate to release any compartments as it is well over 48 hours since onset of being found down and has nothing else to offer from his standpoint but agrees with Vascular Evaluation  -Per my discussion with Dr. Kathaleen Bury even if his MRI shows some hematomas he is likely not a surgical candidate given that cannot heal incisions and would be exceptionally high risk for infection and he feels the left thigh has been stable for the past week and the CT intra-muscular gas was incidentally identified on CT pelvis. -Dr. Kathaleen Bury feels that the patient has essentially no function of that LLE for past 2 years -Orthopedic surgery evaluated and evaluated his repeat CTA and they still feel that he is still not a surgical candidate and they feel that if his symptoms progressed to forward infection and he require surgery it would be likely be a radical serial debridement of the left thigh or left hip disarticulation which will either be extremely morbid associated with very high perioperative mortality -Vascular surgery evaluated and they feel that his vascular disease is likely the result of a very high-grade right common femoral artery stenosis but because of his multiple comorbidities including cirrhosis and thrombocytopenia is not a current operative  candidate and they would not recommend stenting of common femoral artery at this time  Hyperkalemia, hyponatremia and hypomagnesemia Hyperkalemia mild and is now at 5.2 so was given a dose of Lokelma and is now 4.9 Hyponatremia resolved; last sodium went from 137 is now 135 -> 134 and trended down to 130 -> 132 -Patient's Mag Level is now 1.8 x3 -Continue to monitor and replete as necessary; -Repeat CMP, mag, Phos in the AM   Thrombocytopenia ([place]) -Patient's Platelet Count went from 25 -> 25 -> 27 -> 22 -> 25 -> 28 -> 24 -> 38 -> 37 -> 26 -Had 2 apheresis of platelet on 5/27. -Transfuse for platelet < 10k or bleeding -Continue to Monitor for S/Sx of Bleeding; No overt bleeding noted  -Repeat CBC in the AM   Goals of care, counseling/discussion -Remains full code with full scope of care.  See discussion on 5/26 and  5/27 -Palliative medicine following and appreciate further evaluation   Abnormal MRI, liver-resolved as of 12/23/2021 -RUQ [place] concerning for 6 cm liver mass.  Not appreciated on CT abdomen with contrast. -Follow up in the outpatient with GI and General Surgery  Physical deconditioning -Wheelchair-bound at baseline.  He says he is able to transfer using left leg at baseline. -PT/OT recommended SNF -Left leg evaluation as above   Scrotal edema -Likely due in the setting of liver cirrhosis, hypoalbuminemia and IVF hydration.  -IV fluid discontinued and now Diuresis started and increasing  -Continue monitoring and will need Scrotal Sling and Scrotal Support -May discuss with Urology for further outpatient workup   Somnolence - Patient was little bit more sleepy today.  Check ammonia level -Could be in the setting of his lorazepam and Benadryl administration -Continue to monitor closely  Volume overload - Multifactorial in setting of blood transfusions, IV fluid hydration as well as hypoalbuminemia -Recent echocardiogram done showed an EF of 55 to 60% with grade 1  diastolic dysfunction so he could have a CHF component -Check BNP in the morning -Initiate IV Lasix 40 mg every 12 will uptitrate to 60 mg twice daily -Strict I's and O's and daily weights; patient is +5.162 L now as opposed to the 7 L he was yesterday -Continue to monitor for signs and symptoms of volume overload and repeat chest x-ray in the a.m. -May consider right upper quadrant ultrasound in the setting of his liver cirrhosis to evaluate for SBP -Repeat chest x-ray today showed "Increased retrocardiac left lower lobe opacity, could represent atelectasis, pneumonia or aspiration. No other focal lung Infiltrate." -Ultrasound abdominal ascites showed "No free fluid is seen within any of the 4 abdominal quadrants" -We will continue with diuresis and he will get a unit of blood so we will diurese  Drug rash - See above we will stop his Unasyn now given that he has a rash -Added hydrocortisone cream but given that his rash worsen will start systemic steroids with Solu-Medrol 60 mg every 12 which we will continue -Rash worsened and developed significantly after fentanyl patch was placed and this is unlikely the cause but given that this is something you elevated will hold and stop fentanyl patch -Likely a drug reaction though not related to the fentanyl patch -Continue to monitor and patient is itching so he was initiated on Benadryl  Elevated troponin -Likely demand ischemia in the setting of sepsis and shock most likely in the setting of rhabdomyolysis -Denies any current chest pain  Calculus of bile duct with acute cholecystitis -See shock  Hyponatremia - Mild and now sodium went from 137 and dropped to 134 -> 130 -> 132 -Likely the patient is hypervolemic hyponatremic so we will start diuresis -Continue to monitor and trend and repeat CMP in a.m.  Traumatic rhabdomyolysis ([place]) -CK was improving and went from 10,683 at its peak and is now 1873 -> 2149 -> 1737 -> 1270 -> 700    Hyperbilirubinemia -Improving. T Bili went from 8.8 -> 0.9 -> 1.6 -> 1.9 and possibly in the setting of hemolytic anemia and now improved and is back down to 1.0 -Continue to Monitor and Trend -Repeat CMP in the AM    Elevated liver enzymes - Improving significantly; patient's AST was 2284 and ALT was 545 and now trended down; AST is now 60 and ALT is now 27 -He is status post percutaneous cholecystostomy tube -Continue monitor and trend LFTs carefully  Chronic pain syndrome -C/w Oxycodone  to 20 mg every 6 hours.  Per database, fills 120 for 30 days -Palliative care consulted for further goals of care and pain adjustment Fentanyl Patch was started but stopped given Rash  -Discontinue IV Dilaudid -Continue pain control but may need to adjust given his sleepiness  Hemolytic anemia (H[facility]) -In the setting of Liver Cirrhosis -Patient's hemoglobin/hematocrit did drop to 6.6/90.9 and he was transfused 1 unit PRBCs and then elevated to 10.5/31.9.  Repeat showing 8.2/25.2 and now hemoglobin/hematocrit is 8.9/28.6 -> 8.1/25.2 and is now 8.4/26.1 and slightly dropped to 7.1/22.3 yesterday and today was 6.0/18.7 and so we will type and screen and transfuse another 1 unit PRBCs -Continue monitor and trend and continue monitor for signs or symptoms of bleeding; no overt bleeding noted -May need some more blood transfusions but have initiated Lasix and prednisone; will increase the dose of Lasix given that he is getting blood today -Repeat CBC in a.m.   Anxiety -Patient is adamant about his Xanax despite risk for polypharmacy and encephalopathy. -Resumed Alprazolam 1 mg po TIDprn Anxiety  DVT prophylaxis: SCDs Start: 12/18/21 1421    Code Status: Full Code Family Communication: Discussed with son at bedside  Disposition Plan:  Level of care: Progressive Status is: Inpatient Remains inpatient appropriate because: Has multiple medical issues and needs to be stable prior to discharge and will  need to go to SNF   Consultants:  PCCM Transfer General Surgery Orthopedic Surgery Oncology  Vascular Surgery Palliative Care Medicine Gastroenterology Interventional Radiology Infectious Diseases  Discussed with Neurology  Procedures:  ABI MRI Liver CT Abd/Pelvis Procedure: 10.2 fr Cholecystostomy drain done by Dr. Sharen Heck Mir Repeat CTA Abd/Pelvis Ordering an ultrasound ascites  Antimicrobials:  Anti-infectives (From admission, onward)    Start     Dose/Rate Route Frequency Ordered Stop   12/29/21 1000  fluconazole (DIFLUCAN) IVPB 400 mg        400 mg 100 mL/hr over 120 Minutes Intravenous Every 24 hours 12/29/21 0918 01/03/22 0959   12/26/21 0800  Ampicillin-Sulbactam (UNASYN) 3 g in sodium chloride 0.9 % 100 mL IVPB  Status:  Discontinued        3 g 200 mL/hr over 30 Minutes Intravenous Every 6 hours 12/26/21 0711 12/29/21 1204   12/23/21 1100  vancomycin (VANCOREADY) IVPB 1250 mg/250 mL  Status:  Discontinued        1,250 mg 166.7 mL/hr over 90 Minutes Intravenous Every 12 hours 12/23/21 0922 12/26/21 0703   12/23/21 1000  ceFEPIme (MAXIPIME) 2 g in sodium chloride 0.9 % 100 mL IVPB  Status:  Discontinued        2 g 200 mL/hr over 30 Minutes Intravenous Every 8 hours 12/23/21 0922 12/26/21 0703   12/22/21 2048  metroNIDAZOLE (FLAGYL) IVPB 500 mg  Status:  Discontinued        500 mg 100 mL/hr over 60 Minutes Intravenous Every 12 hours 12/22/21 2049 12/26/21 0703   12/21/21 1100  cefTRIAXone (ROCEPHIN) 2 g in sodium chloride 0.9 % 100 mL IVPB  Status:  Discontinued        2 g 200 mL/hr over 30 Minutes Intravenous Every 24 hours 12/21/21 0900 12/23/21 0904   12/20/21 1830  metroNIDAZOLE (FLAGYL) IVPB 500 mg  Status:  Discontinued        500 mg 100 mL/hr over 60 Minutes Intravenous Every 12 hours 12/20/21 1727 12/22/21 2049   12/20/21 1100  cefTRIAXone (ROCEPHIN) 1 g in sodium chloride 0.9 % 100 mL IVPB  Status:  Discontinued  1 g 200 mL/hr over 30 Minutes  Intravenous Every 24 hours 12/20/21 1006 12/21/21 0900   12/19/21 2000  vancomycin (VANCOREADY) IVPB 1250 mg/250 mL  Status:  Discontinued        1,250 mg 166.7 mL/hr over 90 Minutes Intravenous Every 24 hours 12/18/21 1946 12/19/21 1207   12/19/21 1100  ceFEPIme (MAXIPIME) 2 g in sodium chloride 0.9 % 100 mL IVPB  Status:  Discontinued        2 g 200 mL/hr over 30 Minutes Intravenous Every 12 hours 12/19/21 1038 12/20/21 1006   12/19/21 0600  ceFEPIme (MAXIPIME) 2 g in sodium chloride 0.9 % 100 mL IVPB  Status:  Discontinued        2 g 200 mL/hr over 30 Minutes Intravenous Every 12 hours 12/18/21 1943 12/19/21 1038   12/18/21 1845  vancomycin (VANCOREADY) IVPB 2000 mg/400 mL        2,000 mg 200 mL/hr over 120 Minutes Intravenous STAT 12/18/21 1828 12/18/21 2151   12/18/21 1830  ceFEPIme (MAXIPIME) 2 g in sodium chloride 0.9 % 100 mL IVPB        2 g 200 mL/hr over 30 Minutes Intravenous STAT 12/18/21 1818 12/18/21 1951       Subjective: Seen and examined at bedside and he is little bit more sleepy today.  Had some upper airway congestion and wet sounding so he was given more diuresis.  Rash is slightly improving.  Denies any itching.  Appears fatigued and tired.  No lightheadedness or dizziness.  No other concerns or complaints at this time and is having some mild abdominal discomfort.  Objective: Vitals:   12/31/21 1337 12/31/21 1342 12/31/21 1652 12/31/21 1722  BP: (!) 109/52  (!) 105/58 (!) 115/55  Pulse: 79  74 77  Resp: 14     Temp: 97.7 F (36.5 C)  98.3 F (36.8 C) 97.7 F (36.5 C)  TempSrc: Oral  Oral Oral  SpO2: 95% 96% 98% 100%  Weight:      Height:        Intake/Output Summary (Last 24 hours) at 12/31/2021 1748 Last data filed at 12/31/2021 1700 Gross per 24 hour  Intake 505 ml  Output 1755 ml  Net -1250 ml   Filed Weights   12/28/21 0500 12/29/21 0358 12/30/21 0500  Weight: 109.7 kg 109.8 kg 110.7 kg   Examination: Physical Exam:  Constitutional: WN/WD  obese Caucasian male, in no acute distress but does appear little somnolent but easily arousable Respiratory: Diminished to auscultation bilaterally with coarse breath sounds and some slight wheezing noted and some slight crackles., no rales, rhonchi. Normal respiratory effort and patient is not tachypenic. No accessory muscle use.  Unlabored breathing and not wearing supplemental oxygen nasal cannula Cardiovascular: RRR, no murmurs / rubs / gallops. S1 and S2 auscultated.  1-2+ lower extremity edema in the left leg he does have some scrotal edema Abdomen: Soft, non-tender, distended second body habitus and has a percutaneous cholecystostomy drain in place. Bowel sounds positive.  GU: Deferred. Musculoskeletal: No clubbing / cyanosis of digits/nails.  Has a right AKA Neurologic: CN 2-12 grossly intact with no focal deficits. Psychiatric: Normal judgment and insight.  A little somnolent and drowsy but easily arousable  Data Reviewed: I have personally reviewed following labs and imaging studies  CBC: Recent Labs  Lab 12/27/21 1249 12/28/21 0401 12/29/21 0357 12/30/21 0433 12/31/21 0655  WBC 3.9* 4.0 9.1 7.0 2.5*  NEUTROABS 2.9 3.0 7.7 5.4 2.0  HGB 8.9* 8.1* 8.4*  7.1* 6.0*  HCT 28.6* 25.2* 26.1* 22.3* 18.7*  MCV 98.6 97.7 97.0 99.6 98.9  PLT 28* 24* 38* 37* 26*   Basic Metabolic Panel: Recent Labs  Lab 12/26/21 0310 12/27/21 0411 12/28/21 0401 12/29/21 0357 12/30/21 0433 12/31/21 0655  NA 134* 137 135 134* 130* 132*  K 3.6 3.6 3.7 4.9 5.2* 4.9  CL 104 106 103 104 98 97*  CO2 '24 24 24 25 25 27  '$ GLUCOSE 118* 130* 160* 156* 121* 187*  BUN '16 16 14 20 '$ 30* 35*  CREATININE 0.41* 0.46* 0.48* 0.55* 0.63 0.57*  CALCIUM 7.5* 7.8* 7.7* 7.8* 7.9* 8.6*  MG 1.6* 1.6* 1.6* 1.8 1.8 1.8  PHOS 3.4  --  3.3 4.1 4.7* 5.1*   GFR: Estimated Creatinine Clearance: 107.2 mL/min (A) (by C-G formula based on SCr of 0.57 mg/dL (L)). Liver Function Tests: Recent Labs  Lab 12/27/21 0411  12/28/21 0401 12/29/21 0357 12/30/21 0433 12/31/21 0655  AST 86* 83* 76* 71* 60*  ALT 70* 57* 46* 41 41  ALKPHOS 55 58 51 54 77  BILITOT 1.1 0.9 1.6* 1.9* 1.0  PROT 4.8* 4.8* 4.6* 4.8* 5.0*  ALBUMIN 1.8* 1.8* 1.7* 1.7* 1.7*   No results for input(s): LIPASE, AMYLASE in the last 168 hours. Recent Labs  Lab 12/25/21 0317 12/26/21 0704 12/27/21 0411  AMMONIA 33 32 34   Coagulation Profile: Recent Labs  Lab 12/25/21 0317 12/26/21 0310 12/27/21 0411  INR 1.4* 1.4* 1.4*   Cardiac Enzymes: Recent Labs  Lab 12/27/21 0411 12/28/21 0401 12/29/21 0357 12/30/21 0433 12/31/21 0655  CKTOTAL 1,873* 2,149* 1,737* 1,270* 700*   BNP (last 3 results) No results for input(s): PROBNP in the last 8760 hours. HbA1C: No results for input(s): HGBA1C in the last 72 hours. CBG: No results for input(s): GLUCAP in the last 168 hours. Lipid Profile: No results for input(s): CHOL, HDL, LDLCALC, TRIG, CHOLHDL, LDLDIRECT in the last 72 hours. Thyroid Function Tests: No results for input(s): TSH, T4TOTAL, FREET4, T3FREE, THYROIDAB in the last 72 hours. Anemia Panel: No results for input(s): VITAMINB12, FOLATE, FERRITIN, TIBC, IRON, RETICCTPCT in the last 72 hours. Sepsis Labs: No results for input(s): PROCALCITON, LATICACIDVEN in the last 168 hours.  Recent Results (from the past 240 hour(s))  Aerobic/Anaerobic Culture w Gram Stain (surgical/deep wound)     Status: None   Collection Time: 12/23/21 11:58 AM   Specimen: BILE  Result Value Ref Range Status   Specimen Description   Final    BILE Performed at [facility] [address], [place], [place] [address]    Special Requests   Final    NONE Performed at [facility], [place] [address] [place], [place] [address]    Gram Stain   Final    ABUNDANT WBC PRESENT, PREDOMINANTLY MONONUCLEAR MODERATE GRAM POSITIVE COCCI IN PAIRS Performed at [facility], [place] [address] [place], [place]  [address]    Culture   Final    MODERATE ENTEROCOCCUS FAECALIS MODERATE ENTEROCOCCUS FAECIUM ABUNDANT LACTOBACILLUS SPECIES Standardized susceptibility testing for this organism is not available. FEW CANDIDA ALBICANS NO ANAEROBES ISOLATED; CULTURE IN PROGRESS FOR 5 DAYS    Report Status 12/28/2021 FINAL  Final   Organism ID, Bacteria ENTEROCOCCUS FAECALIS  Final   Organism ID, Bacteria ENTEROCOCCUS FAECIUM  Final      Susceptibility   Enterococcus faecalis - MIC*    AMPICILLIN <=2 SENSITIVE Sensitive     VANCOMYCIN 2 SENSITIVE Sensitive  GENTAMICIN SYNERGY SENSITIVE Sensitive     * MODERATE ENTEROCOCCUS FAECALIS   Enterococcus faecium - MIC*    AMPICILLIN <=2 SENSITIVE Sensitive     VANCOMYCIN <=0.5 SENSITIVE Sensitive     GENTAMICIN SYNERGY SENSITIVE Sensitive     * MODERATE ENTEROCOCCUS FAECIUM  Culture, blood (Routine X 2) w Reflex to ID Panel     Status: None   Collection Time: 12/23/21  2:48 PM   Specimen: BLOOD  Result Value Ref Range Status   Specimen Description   Final    BLOOD LEFT ANTECUBITAL Performed at [facility], [place] [address], [place], [place] [address]    Special Requests   Final    NONE Performed at [facility], [place] [address] [place], [place] [address]    Culture   Final    NO GROWTH 5 DAYS Performed at [facility], [place] [address], [place], [place] [address]    Report Status 12/28/2021 FINAL  Final  Culture, blood (Routine X 2) w Reflex to ID Panel     Status: None   Collection Time: 12/23/21  2:48 PM   Specimen: BLOOD  Result Value Ref Range Status   Specimen Description   Final    BLOOD RIGHT ANTECUBITAL Performed at [facility], [place] [address] [place], [place] [address]    Special Requests   Final    NONE Performed at [facility], [place] [address] [place], [place] [address]    Culture   Final    NO GROWTH 5 DAYS Performed at [facility], [place] [address] [place],  [place] [address]    Report Status 12/28/2021 FINAL  Final  Culture, blood (Routine X 2) w Reflex to ID Panel     Status: None (Preliminary result)   Collection Time: 12/29/21  8:50 AM   Specimen: Bronchial Wash; Blood  Result Value Ref Range Status   Specimen Description   Final    BRONCHIAL WASHINGS Performed at [facility] [address] [place], [place] [address]    Special Requests   Final    BOTTLES DRAWN AEROBIC AND ANAEROBIC Blood Culture adequate volume Performed at [facility] [address], [place], [place] [address]    Culture   Final    NO GROWTH 2 DAYS Performed at [facility] [address] [place], [place] [address]    Report Status PENDING  Incomplete  Culture, blood (Routine X 2) w Reflex to ID Panel     Status: None (Preliminary result)   Collection Time: 12/29/21  9:00 AM   Specimen: BLOOD LEFT HAND  Result Value Ref Range Status   Specimen Description   Final    BLOOD LEFT HAND Performed at [facility] [address], [place], [place] [address]    Special Requests   Final    BOTTLES DRAWN AEROBIC AND ANAEROBIC Blood Culture adequate volume Performed at [facility] [address], [place], [place] [address]    Culture   Final    NO GROWTH 2 DAYS Performed at [facility] [address] [place], [place] [address]    Report Status PENDING  Incomplete     Radiology Studies: DG CHEST PORT 1 VIEW  Result Date: 12/31/2021 CLINICAL DATA:  Sepsis and failure to thrive. EXAM: PORTABLE CHEST 1 VIEW COMPARISON:  Portable chest 12/23/2021. FINDINGS: 6:02 a.m., 12/31/2021. The heart is enlarged.  No vascular congestion is seen. There is stable aortic tortuosity with calcification in the transverse segment. No pleural effusion is evident. There is patchy hazy increased retrocardiac left lower lobe opacity today which could be due to atelectasis, pneumonia or aspiration. There is  increased platelike linear atelectasis in the lateral left base. The remainder of the lungs clear.  Thoracic cage is intact. IMPRESSION: Increased retrocardiac left lower lobe opacity, could represent atelectasis, pneumonia or aspiration. No other focal lung infiltrate. Electronically Signed   By: Telford Nab M.D.   On: 12/31/2021 07:35   [place] ASCITES (ABDOMEN LIMITED)  Result Date: 12/30/2021 CLINICAL DATA:  Ascites check. EXAM: LIMITED ABDOMEN ULTRASOUND FOR ASCITES TECHNIQUE: Limited ultrasound survey for ascites was performed in all four abdominal quadrants. COMPARISON:  Dec 18, 2021 FINDINGS: No free fluid is seen within any of the 4 abdominal quadrants. IMPRESSION: No evidence of ascites. Electronically Signed   By: Virgina Norfolk M.D.   On: 12/30/2021 19:36     Scheduled Meds:  (feeding supplement) PROSource Plus  30 mL Oral Daily   sodium chloride   Intravenous Once   feeding supplement  237 mL Oral BID BM   furosemide  60 mg Intravenous BID   guaiFENesin  1,200 mg Oral BID   hydrocortisone cream   Topical TID   ipratropium  0.5 mg Nebulization TID   lactulose  20 g Oral BID   levalbuterol  0.63 mg Nebulization TID   levothyroxine  50 mcg Oral Q0600   magnesium oxide  400 mg Oral BID   mouth rinse  15 mL Mouth Rinse BID   methylPREDNISolone (SOLU-MEDROL) injection  60 mg Intravenous Q12H   multivitamin with minerals  1 tablet Oral Daily   pantoprazole  40 mg Oral BID   QUEtiapine  25 mg Oral QHS   sodium chloride flush  5 mL Intracatheter Q8H   Continuous Infusions:  sodium chloride Stopped (12/23/21 0547)   fluconazole (DIFLUCAN) IV 400 mg (12/31/21 0915)    LOS: 13 days   Raiford Noble, DO [facility] Available via Epic secure chat 7am-7pm After these hours, please refer to coverage provider listed on [email or web address] 12/31/2021, 5:48 PM

## 2021-12-31 NOTE — Progress Notes (Signed)
Daily Progress Note   Patient Name: [name].       Date: 12/31/2021 DOB: September 10, 1950  Age: 71 y.o. MRN#: [id number] Attending Physician: Kerney Elbe, DO Primary Care Physician: [name], FNP Admit Date: 12/18/2021  Reason for Consultation/Follow-up: Establishing goals of care  Subjective: I saw and examined [name] today.  RN was present in room at time of my encounter.  We discussed pain management.  He reports pain has been fairly well controlled on current regimen of oxycodone 20 mg every 4 hours as needed.  He did ask if I thought it would be reasonable to consider restarting another trial with the fentanyl patch.  Discussed that I thought this would be something that would make sense, however, I wanted to review consideration for this with the rest of the care team prior to making that change.  We also discussed rash and I examined it today.  Does not look nearly as inflamed as the first day whenever I saw it.  Length of Stay: 13  Current Medications: Scheduled Meds:   (feeding supplement) PROSource Plus  30 mL Oral Daily   sodium chloride   Intravenous Once   feeding supplement  237 mL Oral BID BM   furosemide  60 mg Intravenous BID   guaiFENesin  1,200 mg Oral BID   hydrocortisone cream   Topical TID   ipratropium  0.5 mg Nebulization TID   lactulose  20 g Oral BID   levalbuterol  0.63 mg Nebulization TID   levothyroxine  50 mcg Oral Q0600   magnesium oxide  400 mg Oral BID   mouth rinse  15 mL Mouth Rinse BID   methylPREDNISolone (SOLU-MEDROL) injection  60 mg Intravenous Q12H   multivitamin with minerals  1 tablet Oral Daily   pantoprazole  40 mg Oral BID   QUEtiapine  25 mg Oral QHS   sodium chloride flush  5 mL Intracatheter Q8H     Continuous Infusions:  sodium chloride Stopped (12/23/21 0547)   fluconazole (DIFLUCAN) IV 400 mg (12/31/21 0915)    PRN Meds: sodium chloride, acetaminophen, ALPRAZolam, diphenhydrAMINE, docusate, haloperidol lactate, metoprolol tartrate, oxyCODONE, polyethylene glycol  Physical Exam         Sleeping in bed Appears chronically ill More edema noted in the upper extremities Right  AKA Regular work of breathing  Vital Signs: BP (!) 115/55   Pulse 77   Temp 97.7 F (36.5 C) (Oral)   Resp 14   Ht 6' (1.829 m)   Wt 110.7 kg   SpO2 100%   BMI 33.10 kg/m  SpO2: SpO2: 100 % O2 Device: O2 Device: Room Air O2 Flow Rate: O2 Flow Rate (L/min): 2 L/min  Intake/output summary:  Intake/Output Summary (Last 24 hours) at 12/31/2021 1855 Last data filed at 12/31/2021 1700 Gross per 24 hour  Intake 505 ml  Output 1755 ml  Net -1250 ml    LBM: Last BM Date : 12/29/21 Baseline Weight: Weight: 88.8 kg Most recent weight: Weight: 110.7 kg       Palliative Assessment/Data:    Flowsheet Rows    Flowsheet Row Most Recent Value  Intake Tab   Referral Department Hospitalist  Unit at Time of Referral Med/Surg Unit  Palliative Care Primary Diagnosis Sepsis/Infectious Disease  Date Notified 12/28/21  Clinical Assessment   Psychosocial & Spiritual Assessment   Palliative Care Outcomes        Patient Active Problem List   Diagnosis Date Noted   Acute pain of left thigh/rhabdomyolysis/myonecrosis 12/23/2021   Septic shock ([place]) 12/18/2021   SVT (supraventricular tachycardia) ([place]) 12/22/2021   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 41/93/7902   AKI (acute kidney injury) ([place]) 40/97/3532   Acute metabolic encephalopathy 99/24/2683   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    Disorder of skeletal system 02/29/2020   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   Left leg weakness and numbness 12/21/2021   Thrombocytopenia (H[facility]) 12/20/2021   Hyperkalemia,  hyponatremia and hypomagnesemia 12/20/2021   Goals of care, counseling/discussion 02/29/2020   Physical deconditioning 02/29/2020   Scrotal edema 12/23/2021   Somnolence 12/31/2021   Volume overload 12/30/2021   Cholecystitis    Drug rash    GI bleed 12/23/2021   Calculus of bile duct with acute cholecystitis 12/21/2021   Elevated troponin 12/21/2021   Elevated liver enzymes 12/20/2021   Hyperbilirubinemia 12/20/2021   Traumatic rhabdomyolysis ([place]) 12/20/2021   Hyperkalemia 12/20/2021   Hyponatremia 12/20/2021   Hypomagnesemia 12/16/2020   Gastric nodule    Duodenal erythema    Polyp of colon    Left leg swelling 12/14/2020   Eye trauma 02/29/2020   History of pancytopenia 02/29/2020   Portal venous hypertension ([place]) 02/29/2020   S/P AKA (above knee amputation) unilateral ([place]) 02/29/2020   Hepatic cirrhosis due to chronic hepatitis C infection ([place]) 02/29/2020   HTN (hypertension) 09/14/2019   Hepatic encephalopathy ([place]) 04/07/2018   Osteoarthritis of left knee 02/26/2018   Chronic pain syndrome 11/23/2016   Hypothyroidism 09/25/2016   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   Hepatitis B core antibody positive 05/22/2016   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[place]) 04/14/2015   Cellulitis of right thigh 05/21/2012   Pancytopenia ([place]) 02/26/2012   Encounter for long-term (current) use of other medications 02/26/2012   Screening for prostate cancer 02/26/2012   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  He requested consideration for trial of a long-acting agent with fentanyl patch again.  We  will  discuss this with the rest of the care team. Discussed goals and advance care planning on 6/1.  He remains Full Code/Full Scope.  Introduced MOST form and discussed how to develop plan of care to focus on continuing therapies that would maximize chance of being well enough to return home and limiting therapies not in line with this goal.  His son, [name] is his HCPOA.  I gave him a copy of [facility] Choices for Loving People to review.  Recommend outpatient follow-up to continue conversation based upon his clinical course of the next few weeks with trial of rehab.    Code Status:    Code Status Orders  (From admission, onward)           Start     Ordered   12/18/21 1422  Full code  Continuous        12/18/21 1421           Code Status History     Date Active Date Inactive Code Status Order ID Comments User Context   12/14/2020 1507 12/18/2020 2106 Full Code [id number]  Artist [place], MD ED   04/07/2018 2206 04/11/2018 1552 Full Code [id number]  [name], MD Inpatient      Advance Directive Documentation    Flowsheet Row Most Recent Value  Type of Advance Directive Healthcare Power of Attorney  Pre-existing out of facility DNR order (yellow form or pink MOST form) --  "MOST" Form in Place? --       Prognosis:  Guarded   Discharge Planning: [facility] for rehab with Palliative care service follow-up  Care plan was discussed with patient  Thank you for allowing the Palliative Medicine Team to assist in the care of this patient.  Micheline Rough, MD  Please contact Palliative Medicine Team phone at [phone] for questions and concerns.

## 2021-12-31 NOTE — Assessment & Plan Note (Signed)
-   Patient was little bit more sleepy yesterday and lethargic but is stable.  Checked ammonia level and was 22 -Could be in the setting of his lorazepam and Benadryl administration -Continue to monitor closely

## 2021-12-31 NOTE — Progress Notes (Signed)
PT demonstrated hands on understanding of Flutter device- non productive cough at this time.

## 2022-01-01 ENCOUNTER — Inpatient Hospital Stay (HOSPITAL_COMMUNITY): Payer: Medicare Other

## 2022-01-01 DIAGNOSIS — K7031 Alcoholic cirrhosis of liver with ascites: Secondary | ICD-10-CM | POA: Diagnosis not present

## 2022-01-01 DIAGNOSIS — A419 Sepsis, unspecified organism: Secondary | ICD-10-CM | POA: Diagnosis not present

## 2022-01-01 DIAGNOSIS — G894 Chronic pain syndrome: Secondary | ICD-10-CM | POA: Diagnosis not present

## 2022-01-01 DIAGNOSIS — R932 Abnormal findings on diagnostic imaging of liver and biliary tract: Secondary | ICD-10-CM | POA: Diagnosis not present

## 2022-01-01 DIAGNOSIS — R21 Rash and other nonspecific skin eruption: Secondary | ICD-10-CM

## 2022-01-01 DIAGNOSIS — G9340 Encephalopathy, unspecified: Secondary | ICD-10-CM | POA: Diagnosis not present

## 2022-01-01 DIAGNOSIS — M79652 Pain in left thigh: Secondary | ICD-10-CM | POA: Diagnosis not present

## 2022-01-01 DIAGNOSIS — G9341 Metabolic encephalopathy: Secondary | ICD-10-CM | POA: Diagnosis not present

## 2022-01-01 LAB — COMPREHENSIVE METABOLIC PANEL
ALT: 46 U/L — ABNORMAL HIGH (ref 0–44)
AST: 71 U/L — ABNORMAL HIGH (ref 15–41)
Albumin: 1.8 g/dL — ABNORMAL LOW (ref 3.5–5.0)
Alkaline Phosphatase: 106 U/L (ref 38–126)
Anion gap: 8 (ref 5–15)
BUN: 38 mg/dL — ABNORMAL HIGH (ref 8–23)
CO2: 27 mmol/L (ref 22–32)
Calcium: 8.7 mg/dL — ABNORMAL LOW (ref 8.9–10.3)
Chloride: 100 mmol/L (ref 98–111)
Creatinine, Ser: 0.61 mg/dL (ref 0.61–1.24)
GFR, Estimated: >60 mL/min (ref >60)
Glucose, Bld: 200 mg/dL — ABNORMAL HIGH (ref 70–99)
Potassium: 4 mmol/L (ref 3.5–5.1)
Sodium: 135 mmol/L (ref 135–145)
Total Bilirubin: 1 mg/dL (ref 0.3–1.2)
Total Protein: 4.9 g/dL — ABNORMAL LOW (ref 6.5–8.1)

## 2022-01-01 LAB — CBC WITH DIFFERENTIAL/PLATELET
Abs Immature Granulocytes: 0.05 10*3/uL (ref 0.00–0.07)
Basophils Absolute: 0 10*3/uL (ref 0.0–0.1)
Basophils Relative: 0 %
Eosinophils Absolute: 0 10*3/uL (ref 0.0–0.5)
Eosinophils Relative: 0 %
HCT: 20.6 % — ABNORMAL LOW (ref 39.0–52.0)
Hemoglobin: 6.7 g/dL — CL (ref 13.0–17.0)
Immature Granulocytes: 1 %
Lymphocytes Relative: 7 %
Lymphs Abs: 0.3 10*3/uL — ABNORMAL LOW (ref 0.7–4.0)
MCH: 31.5 pg (ref 26.0–34.0)
MCHC: 32.5 g/dL (ref 30.0–36.0)
MCV: 96.7 fL (ref 80.0–100.0)
Monocytes Absolute: 0.1 10*3/uL (ref 0.1–1.0)
Monocytes Relative: 3 %
Neutro Abs: 3.6 10*3/uL (ref 1.7–7.7)
Neutrophils Relative %: 89 %
Platelets: 24 10*3/uL — CL (ref 150–400)
RBC: 2.13 MIL/uL — ABNORMAL LOW (ref 4.22–5.81)
RDW: 18.1 % — ABNORMAL HIGH (ref 11.5–15.5)
WBC: 4.1 10*3/uL (ref 4.0–10.5)
nRBC: 0.5 % — ABNORMAL HIGH (ref 0.0–0.2)

## 2022-01-01 LAB — CK: Total CK: 541 U/L — ABNORMAL HIGH (ref 49–397)

## 2022-01-01 LAB — AMMONIA: Ammonia: 22 umol/L (ref 9–35)

## 2022-01-01 LAB — PREPARE RBC (CROSSMATCH)

## 2022-01-01 LAB — PHOSPHORUS: Phosphorus: 4.1 mg/dL (ref 2.5–4.6)

## 2022-01-01 LAB — MAGNESIUM: Magnesium: 1.7 mg/dL (ref 1.7–2.4)

## 2022-01-01 MED ORDER — METHYLPREDNISOLONE SODIUM SUCC 40 MG IJ SOLR
40.0000 mg | Freq: Two times a day (BID) | INTRAMUSCULAR | Status: DC
  Administered 2022-01-01: 40 mg via INTRAVENOUS
  Filled 2022-01-01: qty 1

## 2022-01-01 MED ORDER — LEVALBUTEROL HCL 0.63 MG/3ML IN NEBU
0.6300 mg | INHALATION_SOLUTION | Freq: Two times a day (BID) | RESPIRATORY_TRACT | Status: DC
  Administered 2022-01-02 – 2022-01-04 (×5): 0.63 mg via RESPIRATORY_TRACT
  Filled 2022-01-01 (×5): qty 3

## 2022-01-01 MED ORDER — FUROSEMIDE 10 MG/ML IJ SOLN
80.0000 mg | Freq: Two times a day (BID) | INTRAMUSCULAR | Status: DC
  Administered 2022-01-01 – 2022-01-04 (×6): 80 mg via INTRAVENOUS
  Filled 2022-01-01 (×6): qty 8

## 2022-01-01 MED ORDER — MAGNESIUM SULFATE 2 GM/50ML IV SOLN
2.0000 g | Freq: Once | INTRAVENOUS | Status: AC
  Administered 2022-01-01: 2 g via INTRAVENOUS
  Filled 2022-01-01: qty 50

## 2022-01-01 MED ORDER — IPRATROPIUM BROMIDE 0.02 % IN SOLN
0.5000 mg | Freq: Two times a day (BID) | RESPIRATORY_TRACT | Status: DC
  Administered 2022-01-02 – 2022-01-04 (×5): 0.5 mg via RESPIRATORY_TRACT
  Filled 2022-01-01 (×6): qty 2.5

## 2022-01-01 MED ORDER — SENNA 8.6 MG PO TABS
2.0000 | ORAL_TABLET | Freq: Every day | ORAL | Status: DC
  Administered 2022-01-01 – 2022-01-04 (×4): 17.2 mg via ORAL
  Filled 2022-01-01 (×4): qty 2

## 2022-01-01 MED ORDER — SODIUM CHLORIDE 0.9% IV SOLUTION: Freq: Once | INTRAVENOUS | Status: AC

## 2022-01-01 MED ORDER — FENTANYL 25 MCG/HR TD PT72
1.0000 | MEDICATED_PATCH | TRANSDERMAL | Status: DC
  Administered 2022-01-01 – 2022-01-04 (×2): 1 via TRANSDERMAL
  Filled 2022-01-01: qty 1

## 2022-01-01 NOTE — Progress Notes (Signed)
PROGRESS NOTE    [name].  ASN:[id number] DOB: Jul 13, 1951 DOA: 12/18/2021 PCP: [name], FNP   Brief Narrative:  The patient is a 71 year old M with PMH of EtOH cirrhosis, nonimmune hemolytic anemia, pancytopenia, right AKA and hepatitis C brought to ED with altered mental status after he slid off his bed and found down on the ground by family, and admitted to ICU with hypovolemic shock, hemolytic anemia, liver failure, encephalopathy, AKI and hyperkalemia.  In ED, hypotensive and altered.  Hgb 4.0.  Lactic acid> 9.  AST 495.  ALT 107.  Bili 8.  He was started on Levophed, IV antibiotics and blood transfusion. Hematology consulted. Patient came off Levophed and transferred to [facility] hospitalist service on 5/24.    RUQ [place] concerning for liver cirrhosis and liver mass. 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  Liver cirrhosis, portal HTN, splenomegaly, ascites. General surgery consulted and recommended conservative care with IV antibiotics.   5/26-patient was on IV fluid for AKI and rhabdomyolysis but developed significant scrotal edema.  AKI resolved.  IV fluid discontinued.  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  He also had bloody stool later in the day.  Patient was moved to ICU.  PCCM, general surgery and IR consulted.  Antibiotics broadened.  527-transfused a unit of FFP and 2 units of PRBC.  Had percutaneous cholecystostomy tube.  Orthopedic surgery and GI consulted, and did not feel surgical intervention is indicated.  528-Transferred to progressive  care.  Started on p.o. Seroquel for delirium.  De-escalated antibiotics to IV Unasyn based on biliary culture.  No further GI bleed but blood counts dropped likely due to hemolytic anemia and he was transfused another 1 unit PRBCs on 12/26/2021 early in the AM.    12/27/21 Hemoglobin/hematocrit seems to have stabilized now.  He is still complaining of some leg pain so will be discussed with Ortho.  General surgery has now signed off the case and they are making a referral to [facility] for surgeon/hepatologist there to help address all of his liver issues.  IR evaluated and recommending continuing the [place] catheter with 3 times daily flushes with 5 cc of normal saline acute guarding output every shift and following up in 6 to 8 weeks for replacement or for repeat imaging/possible drain injection.  5/31-6/1: Vascular surgery evaluated and he underwent a CTA of the abdomen and pelvis with bilateral runoff and Dr. Trula Slade reviewed with the radiologist and they feel his vascular disease likely result of a high-grade right common femoral artery stenosis and because of his multiple comorbidities including cirrhosis and thrombocytopenia he is not an operative candidate and he feels that they would not stent his common femoral artery given the comorbidities.  Orthopedic surgery evaluated his new CT angio and they feel that he still not a surgical candidate at this time given that he is clinically stable and slightly improving.  They feel that if his symptoms progress into florid infection and he requires surgery would be a radical series of debridements of the left thigh or left hip disarticulation which would be extremely morbid and associated with a very  high perioperative mortality.  Palliative care was consulted for further pain assistance and management and patient is improving but overnight from 6/1 till 6/2 he spiked a temperature of 101.  He also developed a diffuse body rash and questionable drug reaction.   Given his temperature and rash ID was consulted and he was otherwise hemodynamically stable.  Patient no localizing infectious syndrome currently and he was started on fluconazole for Candida in his bile.  There is no sign of pneumonia or GU infection and he had this diffuse maculopapular erythematous rash on the trunk and arms which appear to be likely drug rash so ID discontinued his Unasyn and recommending continuing fluconazole for Candida in the drain until 01/03/2022.  ID recommended that the rash may get worse but should stabilize early next week and recommended considering anemia SBP given his cirrhosis status so we will obtain a ultrasound of the abdomen.  Blood cultures x2 were obtained and these will need to be followed but patient will follow-up with general surgeon outpatient setting.  12/30/2021 patient started itching so given some Benadryl and his rash is a little worse so we will start him on IV Solu-Medrol 60 mg every 12.  He is also appeared volume overloaded so we will give him IV diuresis with IV Lasix with milligrams twice daily.  Patient was noted to be 7 L positive since admission in the setting of his cirrhosis.  We will continue diuresis for few days now and monitor his rash.  12/31/2021.  Remains volume overloaded and blood count dropped likely in the setting of his hemolytic anemia slightly Worsening.  She is 1 unit PRBCs.  We will continue with steroids given his rash but this is improving.  IV diuresis increased to 60 mg twice daily.  Given that he had a slight wet sounding chest we will give him Xopenex and Atrovent as well as a flutter valve and guaifenesin will also check ammonia level given that he is slightly sleepy.  01/01/22: His blood count remains on the lower side so we will transfuse another 1 unit of PRBCs.  Given his volume overload still will increase the diuretics to IV 80 twice daily.  Will discuss with palliative care about reinitiation of his fentanyl patch.  He has  now signed off the case given that the rash is not consistent with infection and may be a drug rash.  Antibiotics have not been discontinued and fluconazole will be stopped on 01/03/2022.  Ultrasound showed no ascites and he has no signs of SBP.  He is better sounding today from a respiratory standpoint   Assessment and Plan: * Septic shock ([place]) -Patient was Tachypneic, hypotensive with AKI, lactic acidosis and thrombocytopenia, and required vasopressors on admission.   -Imaging raises concern for calculus cholecystitis, possible cholecystocolonic fistula left thigh muscle necrosis versus neck fasciitis.   -Blood and urine cultures NGTD.  Biliary culture with pansensitive Enterococcus faecalis and faecium as well as lactobacillus species.   -Hemodynamically stable.  No leukocytosis and mildly leukopenic  -High risk for surgical intervention -Percutaneous cholecystostomy tube by IR on 5/27 -CTX and Flagyl >>>Vanc, Cefepime and Flagyl 5/27>>> IV Unasyn 5/30 -> 12/29/21 -Patient spiked a temperature overnight and has diffuse maculopapular erythematous rash and likely a drug rash so his Unasyn is now been discontinued -ID has been consulted and appreciate further evaluation and recommending to evaluate for SBP and feel that the rash and the fever could be related to a drug rash so they recommended stopping  Unasyn and recommended continuing fluconazole for Candida in his drain culture for 5 days -We will need to watch his rash carefully and ID feels it can get worse as we can but if it remains stable or improved next week can be discharged early -General surgery sent referral to [facility] for outpatient follow-up -Discharge plan in place from IR standpoint -Blood cultures x2 reordered and showing NGTD at 3 Days  -Tolerating soft diet and likley to D/C to SNF once rash improves and once we know that he is not having any more fevers; A Febrile the last 72 hours   Acute pain of left  thigh/rhabdomyolysis/myonecrosis -CT abdomen and pelvis shows muscular swelling with tracking gas within the proximal muscle compartments likely myonecrosis from traumatic rhabdomyolysis versus neck fasciitis.   -CK improving and almost normalized . -Orthopedic surgery signed off but will have them re-evaluate and they feel he is not a surgical candidate regardless but agree with Vascular Evaluation -MRI leg has been ordered for further investigation but will discontinue this given his repeat CTA with bilateral runoff; See Above MRI discontinued as it won't change management  -Continue monitoring closely   SVT (supraventricular tachycardia) ([place]) -One episode on 5/26.   -As symptomatic.  Normotensive.  Resolved with IV metoprolol. -C/w IV metoprolol as needed -Continue telemetry monitoring while hospitalized   Acute metabolic encephalopathy -Initially, patient was in septic shock with uremia and some degree of hepatic encephalopathy.  -Now he was having some delirium.  He is at risk for polypharmacy as well.  Delirium improved after low-dose Seroquel at night. -Seroquel 25 mg HS and IV Haldol 2 mg q6h PRN-risk and benefit discussed with sons.  QTc 432. -Continue to Treat treatable causes -Minimize sedating medications -Reorientation and delirium precautions.  AKI (acute kidney injury) ([place]) Recent Labs    12/22/21 2345 12/24/21 0309 12/25/21 3903 12/26/21 0310 12/27/21 0411 12/28/21 0401 12/29/21 0357 12/30/21 0433 12/31/21 0655 01/01/22 0542  BUN '16 15 17 16 16 14 20 '$ 30* 35* 38*  CREATININE 0.55* 0.65 0.56* 0.41* 0.46* 0.48* 0.55* 0.63 0.57* 0.61  -Likely due to hypotension and rhabdomyolysis.  Resolved and improved. -Recheck renal function in the morning; renal function is much improved at 30/0.63 -BUN is elevated and it went to 30 and will need to continue monitor; will give steroid so this may worsen but will need to evaluate for upper GI bleed as well -Avoid nephrotoxic  medications, contrast dyes, hypotension and dehydration to ensure adequate renal perfusion and renally adjust medications -Repeat CMP in the a.m.   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 Elevated liver enzymes/hyperbilirubinemia; As Above Recent Labs  Lab 12/28/21 0401 12/29/21 0357 12/30/21 0433 12/31/21 0655 01/01/22 0542  AST 83* 76* 71* 60* 71*  ALT 57* 46* 41 41 46*  ALKPHOS 58 51 54 77 106  BILITOT 0.9 1.6* 1.9* 1.0 1.0  PROT 4.8* 4.6* 4.8* 5.0* 4.9*  ALBUMIN 1.8* 1.7* 1.7* 1.7* 1.8*  LFT pattern consistent rhabdo.  Rhabdo improving.  He has mild coagulopathy as well. -I have initiated Lasix given his volume overload -Continue monitoring LFT and CK  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 Recent Labs    12/26/21 0310 12/26/21 0704 12/26/21 1450 12/27/21 0411 12/27/21 1249 12/28/21 0401 12/29/21 0357 12/30/21 0433 12/31/21 0655 01/01/22 0542  HGB 6.6* 6.7* 10.5* 8.2* 8.9* 8.1* 8.4* 7.1* 6.0* 6.7*  Non-autoimmune hemolytic anemia due to liver cirrhosis and splenomegaly. LDH markedly elevated.  Haptoglobin low.  Had a  splenomegaly and splenic infarction.  Had frank red blood in his stool on 5/26 but no further bleed after FFP and platelet transfusion. EGD 5/22 with gastritis and duodenitis.  Colo in 5/22 with polyps, diverticulosis and internal hemorrhoid.  -Transfused a total of 9 units PRBC, 1 apheresis of platelet and 1 FFP so far. [facility] signed off. -Monitor H&H and transfuse for Hgb <7.0.  -Now hemoglobin/hematocrit is improved and trend has gone from 8.9/28.6  -> 8.1/25.2 -> 8.4/26.1 now dropped to 7.1/22.3 the day before yesterday and yesterday was 6.0/18.7 and today was 6.7/20.6 -Repeat CBC in the AM   Splenic infarction -Wedge-shaped area of hypoenhancement involving the anterior spleen concerning for splenic infarction noted on CT abdomen and pelvis. Repeat CTA shows stable lesion  -Per General Surgery. -They are planning to  refer him out to [facility] for further issues after D/C; could be contributing to his thrombocytopenia  Left leg weakness and numbness -Likely from rhabdomyolysis/myonecrosis of left thigh. ABI with severe PAD and showed Resting left ankle-brachial index indicates severe left lower  extremity arterial disease. The left toe-brachial index is absent.  -He may need repeat ABI once he recovers -Closely monitor discussed with vascular surgery Dr. Trula Slade who will see the patient later this evening for  Further evaluation and recommendations -Orthopedic Surgery following and the numbness could be related to the myonecrosis however after discussion with the vascular surgery team they are requesting Neurology input; after discussion with neurology they are recommending obtaining MRI of the leg as well as MRI of the L-spine with and without contrast and have the orthopedic evaluate for possible compartment syndrome; patient was to undergo MRIs however vascular evaluated and recommended getting a CT of the abdomen pelvis bilateral runoff; given that an MRI would not change his management will discontinue the MRIs -I spoke with Dr. Kathaleen Bury and he feels the patient has baseline numbness in that leg with myonecrosis of thigh from being found down and he would not be a surgical candidate to release any compartments as it is well over 48 hours since onset of being found down and has nothing else to offer from his standpoint but agrees with Vascular Evaluation  -Per my discussion with Dr. Kathaleen Bury even if his MRI shows some hematomas he is likely not a surgical candidate given that cannot heal incisions and would be exceptionally high risk for infection and he feels the left thigh has been stable for the past week and the CT intra-muscular gas was incidentally identified on CT pelvis. -Dr. Kathaleen Bury feels that the patient has essentially no function of that LLE for past 2 years -Orthopedic  surgery evaluated and evaluated his repeat CTA and they still feel that he is still not a surgical candidate and they feel that if his symptoms progressed to forward infection and he require surgery it would be likely be a radical serial debridement of the left thigh or left hip disarticulation which will either be extremely morbid associated with very high perioperative mortality -Vascular surgery evaluated and they feel that his vascular disease is likely the result of a very high-grade right common femoral artery stenosis but because of his multiple comorbidities including cirrhosis and thrombocytopenia is not a current operative candidate and they would not recommend stenting of common femoral artery at this time  Hyperkalemia, hyponatremia and hypomagnesemia Hyperkalemia mild and is now at 5.2 so was given a dose of Lokelma and is now 4.9 -> 4.0 Hyponatremia resolved; last sodium went  from 137 is now 135 -> 134 and trended down to 130 -> 132 -> 135 -Patient's Mag Level is now 1.7 -Continue to monitor and replete as necessary; -Repeat CMP, mag, Phos in the AM   Thrombocytopenia ([place]) -Patient's Platelet Count went from 25 -> 25 -> 27 -> 22 -> 25 -> 28 -> 24 -> 38 -> 37 -> 26 -> 24 -Had 2 apheresis of platelet on 5/27. -Transfuse for platelet < 10k or bleeding -Continue to Monitor for S/Sx of Bleeding; No overt bleeding noted  -Repeat CBC in the AM   Goals of care, counseling/discussion -Remains full code with full scope of care.  See discussion on 5/26 and 5/27 -Palliative medicine following and appreciate further evaluation   Abnormal MRI, liver-resolved as of 12/23/2021 -RUQ [place] concerning for 6 cm liver mass.  Not appreciated on CT abdomen with contrast. -Follow up in the outpatient with GI and General Surgery  Physical deconditioning -Wheelchair-bound at baseline.  He says he is able to transfer using left leg at baseline. -PT/OT recommended SNF -Left leg evaluation as above    Scrotal edema -Likely due in the setting of liver cirrhosis, hypoalbuminemia and IVF hydration.  -IV fluid discontinued and now Diuresis started and increasing as above -Continue monitoring and will need Scrotal Sling and Scrotal Support -May discuss with Urology for further outpatient workup   Somnolence - Patient was little bit more sleepy yesterday and lethargic but is stable.  Checked ammonia level and was 22 -Could be in the setting of his lorazepam and Benadryl administration -Continue to monitor closely  Volume overload - Multifactorial in setting of blood transfusions, IV fluid hydration as well as hypoalbuminemia -Recent echocardiogram done showed an EF of 55 to 60% with grade 1 diastolic dysfunction so he could have a CHF component -Check BNP in the morning -Initiatde IV Lasix 40 mg twice daily and was increased to IV 60 mg yesterday and now further increased to IV 80 mg twice daily -Strict I's and O's and daily weights; patient is + 4.441 L now  as opposed to the 7 L he was the day before yesterday -Continue to monitor for signs and symptoms of volume overload and repeat chest x-ray in the a.m. -May consider right upper quadrant ultrasound in the setting of his liver cirrhosis to evaluate for SBP -Repeat chest x-ray today showed "Increased retrocardiac left lower lobe opacity, could represent atelectasis, pneumonia or aspiration. No other focal lung Infiltrate." -Ultrasound abdominal ascites showed "No free fluid is seen within any of the 4 abdominal quadrants" -We will continue with diuresis and he will get another unit of blood so we will diurese  Drug rash - See above we will stop his Unasyn now given that he has a rash -Added hydrocortisone cream but given that his rash worsen will start systemic steroids with Solu-Medrol 60 mg every 12 which we will continue for now and titrate down to 40 mg q12h this evening  -Rash worsened and developed significantly after fentanyl  patch was placed and this is unlikely the cause but given that this is something you elevated will hold and stop fentanyl patch -Likely a drug reaction though not related to the fentanyl patch; Try Reinitiating Fentanyl Patch -Continue to monitor and patient is itching so he was initiated on Benadryl  Elevated troponin -Likely demand ischemia in the setting of sepsis and shock most likely in the setting of rhabdomyolysis -Denies any current chest pain  Calculus of bile duct with acute cholecystitis -  See shock  Hyponatremia - Mild and now sodium went from 137 and dropped to 134 -> 130 -> 132 -> 135 -Likely the patient is hypervolemic hyponatremic so we will start diuresis -Continue to monitor and trend and repeat CMP in a.m.  Traumatic rhabdomyolysis ([place]) -CK was improving and went from 10,683 at its peak and is now 1873 -> 2149 -> 1737 -> 1270 -> 700 -> 541 -No longer getting IVF Hydration  Hyperbilirubinemia -Improving. T Bili went from 8.8 -> 0.9 -> 1.6 -> 1.9 and possibly in the setting of hemolytic anemia and now improved and is back down to 1.0 x2 -Continue to Monitor and Trend -Repeat CMP in the AM    Elevated liver enzymes - Improving significantly; patient's AST was 2284 and ALT was 545 and now trended down; AST is now 60 and ALT is now 65 -He is status post percutaneous cholecystostomy tube -Continue monitor and trend LFTs carefully  Chronic pain syndrome -C/w Oxycodone to 20 mg every 6 hours.  Per database, fills 120 for 30 days -Palliative care consulted for further goals of care and pain adjustment Fentanyl Patch was started but stopped given Rash but unlikely contributing so will re-challenge -Discontinued V Dilaudid -Continue pain control but may need to adjust given his sleepiness  Hemolytic anemia (H[facility]) -In the setting of Liver Cirrhosis -Patient's hemoglobin/hematocrit did drop to 6.6/90.9 and he was transfused 1 unit PRBCs and then elevated to 10.5/31.9.   Repeat showing 8.2/25.2 and now hemoglobin/hematocrit is 8.9/28.6 -> 8.1/25.2 -> 8.4/26.1 -> 7.1/22.3  The day before yesterday and yesterday was 6.0/18.7 and today is 6.7/20.6 and so we will type and screen and transfuse another 1 unit PRBCs;  -S/p transfusion of 9 units of pRBCs, 1 unit of FFP and 1 unit of Transfused Platelet Pheresis  -Continue monitor and trend and continue monitor for signs or symptoms of bleeding; no overt bleeding noted -May need some more blood transfusions but have initiated Lasix and prednisone; will increase the dose of Lasix again given that he is getting blood today -Repeat CBC in a.m.   Anxiety -Patient is adamant about his Xanax despite risk for polypharmacy and encephalopathy. -Resumed Alprazolam 1 mg po TIDprn Anxiety  DVT prophylaxis: SCDs Start: 12/18/21 1421    Code Status: Full Code Family Communication: Discussed with son at bedside  Disposition Plan:  Level of care: Progressive Status is: Inpatient Remains inpatient appropriate because: Continues to have low hemoglobin continues to be volume overloaded.  Getting more blood today and remains a little somnolent again but easily arousable   Consultants:  PCCM Transfer General Surgery Orthopedic Surgery Oncology  Vascular Surgery Palliative Care Medicine Gastroenterology Interventional Radiology Infectious Diseases  Discussed with Neurology  Procedures:  ABI MRI Liver CT Abd/Pelvis Procedure: 10.2 fr Cholecystostomy drain done by Dr. Sharen Heck Mir Repeat CTA Abd/Pelvis Ordering an ultrasound ascites  Antimicrobials:  Anti-infectives (From admission, onward)    Start     Dose/Rate Route Frequency Ordered Stop   12/29/21 1000  fluconazole (DIFLUCAN) IVPB 400 mg        400 mg 100 mL/hr over 120 Minutes Intravenous Every 24 hours 12/29/21 0918 01/03/22 0959   12/26/21 0800  Ampicillin-Sulbactam (UNASYN) 3 g in sodium chloride 0.9 % 100 mL IVPB  Status:  Discontinued        3 g 200 mL/hr  over 30 Minutes Intravenous Every 6 hours 12/26/21 0711 12/29/21 1204   12/23/21 1100  vancomycin (VANCOREADY) IVPB 1250 mg/250 mL  Status:  Discontinued        1,250 mg 166.7 mL/hr over 90 Minutes Intravenous Every 12 hours 12/23/21 0922 12/26/21 0703   12/23/21 1000  ceFEPIme (MAXIPIME) 2 g in sodium chloride 0.9 % 100 mL IVPB  Status:  Discontinued        2 g 200 mL/hr over 30 Minutes Intravenous Every 8 hours 12/23/21 0922 12/26/21 0703   12/22/21 2048  metroNIDAZOLE (FLAGYL) IVPB 500 mg  Status:  Discontinued        500 mg 100 mL/hr over 60 Minutes Intravenous Every 12 hours 12/22/21 2049 12/26/21 0703   12/21/21 1100  cefTRIAXone (ROCEPHIN) 2 g in sodium chloride 0.9 % 100 mL IVPB  Status:  Discontinued        2 g 200 mL/hr over 30 Minutes Intravenous Every 24 hours 12/21/21 0900 12/23/21 0904   12/20/21 1830  metroNIDAZOLE (FLAGYL) IVPB 500 mg  Status:  Discontinued        500 mg 100 mL/hr over 60 Minutes Intravenous Every 12 hours 12/20/21 1727 12/22/21 2049   12/20/21 1100  cefTRIAXone (ROCEPHIN) 1 g in sodium chloride 0.9 % 100 mL IVPB  Status:  Discontinued        1 g 200 mL/hr over 30 Minutes Intravenous Every 24 hours 12/20/21 1006 12/21/21 0900   12/19/21 2000  vancomycin (VANCOREADY) IVPB 1250 mg/250 mL  Status:  Discontinued        1,250 mg 166.7 mL/hr over 90 Minutes Intravenous Every 24 hours 12/18/21 1946 12/19/21 1207   12/19/21 1100  ceFEPIme (MAXIPIME) 2 g in sodium chloride 0.9 % 100 mL IVPB  Status:  Discontinued        2 g 200 mL/hr over 30 Minutes Intravenous Every 12 hours 12/19/21 1038 12/20/21 1006   12/19/21 0600  ceFEPIme (MAXIPIME) 2 g in sodium chloride 0.9 % 100 mL IVPB  Status:  Discontinued        2 g 200 mL/hr over 30 Minutes Intravenous Every 12 hours 12/18/21 1943 12/19/21 1038   12/18/21 1845  vancomycin (VANCOREADY) IVPB 2000 mg/400 mL        2,000 mg 200 mL/hr over 120 Minutes Intravenous STAT 12/18/21 1828 12/18/21 2151   12/18/21 1830   ceFEPIme (MAXIPIME) 2 g in sodium chloride 0.9 % 100 mL IVPB        2 g 200 mL/hr over 30 Minutes Intravenous STAT 12/18/21 1818 12/18/21 1951       Subjective: Seen and examined at bedside and he was still little drowsy but easily arousable.  His upper airway congestion with sounding cough is improved.  His swelling is improving and his rash is significantly improved.  Denies any itching currently.  Continues to appear fatigued and tired.  Denies any lightheadedness or dizziness and has no other concerns at this time.  Objective: Vitals:   01/01/22 1407 01/01/22 1434 01/01/22 1434 01/01/22 1437  BP:    132/60  Pulse:   77 80  Resp: '12 14  14  '$ Temp:    98.1 F (36.7 C)  TempSrc:    Oral  SpO2: 95%  94% 98%  Weight:      Height:        Intake/Output Summary (Last 24 hours) at 01/01/2022 1536 Last data filed at 01/01/2022 1353 Gross per 24 hour  Intake 1833.5 ml  Output 2855 ml  Net -1021.5 ml   Filed Weights   12/29/21 0358 12/30/21 0500 01/01/22 0519  Weight: 109.8 kg 110.7 kg 108.7  kg   Examination: Physical Exam:  Constitutional: WN/WD Obese Caucasian male who is chronically ill-appearing and is still somnolent but easily arousable Respiratory: Diminished to auscultation bilaterally with coarse breath sounds but his slight wheezing and slight crackles have improved. . Normal respiratory effort and patient is not tachypenic. No accessory muscle use.  Has unlabored breathing and not wearing supplemental oxygen via nasal cannula Cardiovascular: RRR, no murmurs / rubs / gallops. S1 and S2 auscultated.  1-2+ lower extremity edema on the left leg and continues to have significant scrotal edema Abdomen: Soft, non-tender, distended secondary body habitus and has a percutaneous cholecystostomy drain in place. bowel sounds positive.  GU: Deferred. 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  He is  little somnolent and drowsy but easily arousable  Data Reviewed: I have personally reviewed following labs and imaging studies  CBC: Recent Labs  Lab 12/28/21 0401 12/29/21 0357 12/30/21 0433 12/31/21 0655 01/01/22 0542  WBC 4.0 9.1 7.0 2.5* 4.1  NEUTROABS 3.0 7.7 5.4 2.0 3.6  HGB 8.1* 8.4* 7.1* 6.0* 6.7*  HCT 25.2* 26.1* 22.3* 18.7* 20.6*  MCV 97.7 97.0 99.6 98.9 96.7  PLT 24* 38* 37* 26* 24*   Basic Metabolic Panel: Recent Labs  Lab 12/28/21 0401 12/29/21 0357 12/30/21 0433 12/31/21 0655 01/01/22 0542  NA 135 134* 130* 132* 135  K 3.7 4.9 5.2* 4.9 4.0  CL 103 104 98 97* 100  CO2 '24 25 25 27 27  '$ GLUCOSE 160* 156* 121* 187* 200*  BUN 14 20 30* 35* 38*  CREATININE 0.48* 0.55* 0.63 0.57* 0.61  CALCIUM 7.7* 7.8* 7.9* 8.6* 8.7*  MG 1.6* 1.8 1.8 1.8 1.7  PHOS 3.3 4.1 4.7* 5.1* 4.1   GFR: Estimated Creatinine Clearance: 106.3 mL/min (by C-G formula based on SCr of 0.61 mg/dL). Liver Function Tests: Recent Labs  Lab 12/28/21 0401 12/29/21 0357 12/30/21 0433 12/31/21 0655 01/01/22 0542  AST 83* 76* 71* 60* 71*  ALT 57* 46* 41 41 46*  ALKPHOS 58 51 54 77 106  BILITOT 0.9 1.6* 1.9* 1.0 1.0  PROT 4.8* 4.6* 4.8* 5.0* 4.9*  ALBUMIN 1.8* 1.7* 1.7* 1.7* 1.8*   No results for input(s): LIPASE, AMYLASE in the last 168 hours. Recent Labs  Lab 12/26/21 0704 12/27/21 0411 01/01/22 0542  AMMONIA 32 34 22   Coagulation Profile: Recent Labs  Lab 12/26/21 0310 12/27/21 0411  INR 1.4* 1.4*   Cardiac Enzymes: Recent Labs  Lab 12/28/21 0401 12/29/21 0357 12/30/21 0433 12/31/21 0655 01/01/22 0542  CKTOTAL 2,149* 1,737* 1,270* 700* 541*   BNP (last 3 results) No results for input(s): PROBNP in the last 8760 hours. HbA1C: No results for input(s): HGBA1C in the last 72 hours. CBG: No results for input(s): GLUCAP in the last 168 hours. Lipid Profile: No results for input(s): CHOL, HDL, LDLCALC, TRIG, CHOLHDL, LDLDIRECT in the last 72 hours. Thyroid Function  Tests: No results for input(s): TSH, T4TOTAL, FREET4, T3FREE, THYROIDAB in the last 72 hours. Anemia Panel: No results for input(s): VITAMINB12, FOLATE, FERRITIN, TIBC, IRON, RETICCTPCT in the last 72 hours. Sepsis Labs: No results for input(s): PROCALCITON, LATICACIDVEN in the last 168 hours.  Recent Results (from the past 240 hour(s))  Aerobic/Anaerobic Culture w Gram Stain (surgical/deep wound)     Status: None   Collection Time: 12/23/21 11:58 AM   Specimen: BILE  Result Value Ref Range Status   Specimen Description   Final  BILE Performed at [facility], [place] [address], [place], [place] [address]    Special Requests   Final    NONE Performed at [facility], [place] [address], [place], [place] [address]    Gram Stain   Final    ABUNDANT WBC PRESENT, PREDOMINANTLY MONONUCLEAR MODERATE GRAM POSITIVE COCCI IN PAIRS Performed at [facility], [place] [address], [place], [place] [address]    Culture   Final    MODERATE ENTEROCOCCUS FAECALIS MODERATE ENTEROCOCCUS FAECIUM ABUNDANT LACTOBACILLUS SPECIES Standardized susceptibility testing for this organism is not available. FEW CANDIDA ALBICANS NO ANAEROBES ISOLATED; CULTURE IN PROGRESS FOR 5 DAYS    Report Status 12/28/2021 FINAL  Final   Organism ID, Bacteria ENTEROCOCCUS FAECALIS  Final   Organism ID, Bacteria ENTEROCOCCUS FAECIUM  Final      Susceptibility   Enterococcus faecalis - MIC*    AMPICILLIN <=2 SENSITIVE Sensitive     VANCOMYCIN 2 SENSITIVE Sensitive     GENTAMICIN SYNERGY SENSITIVE Sensitive     * MODERATE ENTEROCOCCUS FAECALIS   Enterococcus faecium - MIC*    AMPICILLIN <=2 SENSITIVE Sensitive     VANCOMYCIN <=0.5 SENSITIVE Sensitive     GENTAMICIN SYNERGY SENSITIVE Sensitive     * MODERATE ENTEROCOCCUS FAECIUM  Culture, blood (Routine X 2) w Reflex to ID Panel     Status: None   Collection Time: 12/23/21  2:48 PM   Specimen: BLOOD  Result Value Ref Range Status    Specimen Description   Final    BLOOD LEFT ANTECUBITAL Performed at [facility], [place] [address] [place], [place] [address]    Special Requests   Final    NONE Performed at [facility], [place] [address] [place], [place] [address]    Culture   Final    NO GROWTH 5 DAYS Performed at [facility], [place] [address] [place], [place] [address]    Report Status 12/28/2021 FINAL  Final  Culture, blood (Routine X 2) w Reflex to ID Panel     Status: None   Collection Time: 12/23/21  2:48 PM   Specimen: BLOOD  Result Value Ref Range Status   Specimen Description   Final    BLOOD RIGHT ANTECUBITAL Performed at [facility], [place] [address] [place], [place] [address]    Special Requests   Final    NONE Performed at [facility], [place] [address] [place], [place] [address]    Culture   Final    NO GROWTH 5 DAYS Performed at [facility], [place] [address] [place], [place] [address]    Report Status 12/28/2021 FINAL  Final  Culture, blood (Routine X 2) w Reflex to ID Panel     Status: None (Preliminary result)   Collection Time: 12/29/21  8:50 AM   Specimen: Bronchial Wash; Blood  Result Value Ref Range Status   Specimen Description   Final    BRONCHIAL WASHINGS Performed at [facility] [address] [place], [place] [address]    Special Requests   Final    BOTTLES DRAWN AEROBIC AND ANAEROBIC Blood Culture adequate volume Performed at [place] [address] [place], [place] [address]    Culture   Final    NO GROWTH 3 DAYS Performed at [facility], [place] [address] [place], [place] [address]    Report Status PENDING  Incomplete  Culture,  blood (Routine X 2) w Reflex to ID Panel     Status: None (Preliminary result)   Collection Time: 12/29/21  9:00 AM   Specimen: BLOOD LEFT HAND  Result Value Ref Range Status   Specimen Description   Final    BLOOD LEFT  HAND Performed at [place] [address], [place], [place] [address]    Special Requests   Final    BOTTLES DRAWN AEROBIC AND ANAEROBIC Blood Culture adequate volume Performed at [place] [address] [place], [place] [address]    Culture   Final    NO GROWTH 3 DAYS Performed at [facility], [place] [address], [place],  [address]    Report Status PENDING  Incomplete     Radiology Studies: DG CHEST PORT 1 VIEW  Result Date: 01/01/2022 CLINICAL DATA:  Septic shock EXAM: PORTABLE CHEST 1 VIEW COMPARISON:  December 31, 2021 FINDINGS: The heart size and mediastinal contours are stable. The heart size is enlarged. Both lungs are clear. The visualized skeletal structures are stable. IMPRESSION: No active disease. Electronically Signed   By: Abelardo Diesel M.D.   On: 01/01/2022 07:19   DG CHEST PORT 1 VIEW  Result Date: 12/31/2021 CLINICAL DATA:  Sepsis and failure to thrive. EXAM: PORTABLE CHEST 1 VIEW COMPARISON:  Portable chest 12/23/2021. FINDINGS: 6:02 a.m., 12/31/2021. The heart is enlarged. No vascular congestion is seen. There is stable aortic tortuosity with calcification in the transverse segment. No pleural effusion is evident. There is patchy hazy increased retrocardiac left lower lobe opacity today which could be due to atelectasis, pneumonia or aspiration. There is increased platelike linear atelectasis in the lateral left base. The remainder of the lungs clear.  Thoracic cage is intact. IMPRESSION: Increased retrocardiac left lower lobe opacity, could represent atelectasis, pneumonia or aspiration. No other focal lung infiltrate. Electronically Signed   By: Telford Nab M.D.   On: 12/31/2021 07:35   [place] ASCITES (ABDOMEN LIMITED)  Result Date: 12/30/2021 CLINICAL DATA:  Ascites check. EXAM: LIMITED ABDOMEN ULTRASOUND FOR ASCITES TECHNIQUE: Limited ultrasound survey for ascites was performed in all four abdominal quadrants.  COMPARISON:  Dec 18, 2021 FINDINGS: No free fluid is seen within any of the 4 abdominal quadrants. IMPRESSION: No evidence of ascites. Electronically Signed   By: Virgina Norfolk M.D.   On: 12/30/2021 19:36    Scheduled Meds:  (feeding supplement) PROSource Plus  30 mL Oral Daily   sodium chloride   Intravenous Once   feeding supplement  237 mL Oral BID BM   furosemide  80 mg Intravenous BID   guaiFENesin  1,200 mg Oral BID   hydrocortisone cream   Topical TID   ipratropium  0.5 mg Nebulization TID   lactulose  20 g Oral BID   levalbuterol  0.63 mg Nebulization TID   levothyroxine  50 mcg Oral Q0600   magnesium oxide  400 mg Oral BID   mouth rinse  15 mL Mouth Rinse BID   methylPREDNISolone (SOLU-MEDROL) injection  40 mg Intravenous Q12H   multivitamin with minerals  1 tablet Oral Daily   pantoprazole  40 mg Oral BID   QUEtiapine  25 mg Oral QHS   sodium chloride flush  5 mL Intracatheter Q8H   Continuous Infusions:  sodium chloride Stopped (12/23/21 0547)   fluconazole (DIFLUCAN) IV 400 mg (01/01/22 0841)   magnesium sulfate bolus IVPB 2 g (01/01/22 1503)    LOS: 14 days   Laiken Sandy  Alfredia Ferguson, DO [facility] Available via Epic secure chat 7am-7pm After these hours, please refer to coverage provider listed on [email or web address] 01/01/2022, 3:36 PM

## 2022-01-01 NOTE — Progress Notes (Signed)
Referring Physician(s): Burley Saver  Supervising Physician: Michaelle Birks  Patient Status:  [facility] - In-pt  Chief Complaint: Cholecystitis s/p cholecystostomy drain 12/23/21 with Dr. Dwaine Gale   Subjective:  Pt laying In bed, NAD but appears lethargic. RN at bedside.  Pt receiving blood.  Reports abdominal pain all over, denies N/V fever chills.   Allergies: Clindamycin  Medications: Prior to Admission medications   Medication Sig Start Date End Date Taking? Authorizing Provider  ALPRAZolam Duanne Moron) 1 MG tablet Take 1 mg by mouth 3 (three) times daily.   Yes [provider]  Cholecalciferol (VITAMIN D3) 10 MCG (400 UNIT) CAPS Take 4 capsules by mouth daily.   Yes [provider]  folic acid (FOLVITE) 1 MG tablet TAKE 1 TABLET(1 MG) BY MOUTH DAILY Patient taking differently: Take 1 mg by mouth daily. 10/12/21  Yes Burns, Wandra Feinstein, NP  hydrochlorothiazide (HYDRODIURIL) 12.5 MG tablet TAKE 1 TABLET(12.5 MG) BY MOUTH DAILY Patient taking differently: Take 12.5 mg by mouth daily. 10/27/21  Yes [name], FNP  levothyroxine (SYNTHROID) 50 MCG tablet TAKE 1 TABLET(50 MCG) BY MOUTH DAILY BEFORE BREAKFAST Patient taking differently: Take 50 mcg by mouth daily before breakfast. 12/14/21  Yes Arnett, [name], FNP  losartan (COZAAR) 100 MG tablet TAKE 1 TABLET(100 MG) BY MOUTH DAILY Patient taking differently: Take 100 mg by mouth daily. 11/14/21  Yes Arnett, [name], FNP  lubiprostone (AMITIZA) 24 MCG capsule Take 24 mcg by mouth daily as needed. 08/13/14  Yes [provider]  meloxicam (MOBIC) 15 MG tablet Take 15 mg by mouth every morning. 01/12/21  Yes [provider]  morphine (MS CONTIN) 15 MG 12 hr tablet Take 15 mg by mouth 2 (two) times daily as needed for pain. 04/06/20  Yes [provider]  Oxycodone HCl 20 MG TABS Take 20 mg by mouth 4 (four) times daily. 12/04/21  Yes [provider]  pantoprazole (PROTONIX) 40 MG tablet Take  1 tablet (40 mg total) by mouth daily. 12/18/20 12/18/21 Yes Sharen Hones, MD  vitamin B-12 (CYANOCOBALAMIN) 1000 MCG tablet Take 1 tablet (1,000 mcg total) by mouth daily. 04/09/18  Yes Bettey Costa, MD  NARCAN 4 MG/0.1ML LIQD nasal spray kit Place 1 spray into the nose once. 12/29/18   [provider]     Vital Signs: BP (!) 130/57   Pulse 77   Temp 98.5 F (36.9 C) (Oral)   Resp 20   Ht 6' (1.829 m)   Wt 239 lb 10.2 oz (108.7 kg)   SpO2 95%   BMI 32.50 kg/m   Physical Exam Vitals reviewed.  Constitutional:      General: He is not in acute distress.    Appearance: He is ill-appearing.  HENT:     Head: Normocephalic.  Pulmonary:     Effort: Pulmonary effort is normal.  Abdominal:     General: Abdomen is flat.     Palpations: Abdomen is soft.  Skin:    General: Skin is warm and dry.     Coloration: Skin is not jaundiced or pale.     Comments: Positive RUQ drain to a gravity bag. Site is unremarkable with no erythema, edema, tenderness, bleeding or drainage. Suture and stat lock in place. Dressing is clean, dry, and intact. Trace orange colored fluid noted in the bag. Drain flushes well, does not aspirate.    Neurological:     Comments: Lethargic, able to answer questions appropriately.   Psychiatric:  Behavior: Behavior normal.   awake/alert; GB drain intact, insertion site okay, mildly tender, output 270 cc of dark bilious fluid, drain flushed without difficulty   Imaging: DG CHEST PORT 1 VIEW  Result Date: 01/01/2022 CLINICAL DATA:  Septic shock EXAM: PORTABLE CHEST 1 VIEW COMPARISON:  December 31, 2021 FINDINGS: The heart size and mediastinal contours are stable. The heart size is enlarged. Both lungs are clear. The visualized skeletal structures are stable. IMPRESSION: No active disease. Electronically Signed   By: Abelardo Diesel M.D.   On: 01/01/2022 07:19   DG CHEST PORT 1 VIEW  Result Date: 12/31/2021 CLINICAL DATA:  Sepsis and failure to thrive. EXAM:  PORTABLE CHEST 1 VIEW COMPARISON:  Portable chest 12/23/2021. FINDINGS: 6:02 a.m., 12/31/2021. The heart is enlarged. No vascular congestion is seen. There is stable aortic tortuosity with calcification in the transverse segment. No pleural effusion is evident. There is patchy hazy increased retrocardiac left lower lobe opacity today which could be due to atelectasis, pneumonia or aspiration. There is increased platelike linear atelectasis in the lateral left base. The remainder of the lungs clear.  Thoracic cage is intact. IMPRESSION: Increased retrocardiac left lower lobe opacity, could represent atelectasis, pneumonia or aspiration. No other focal lung infiltrate. Electronically Signed   By: Telford Nab M.D.   On: 12/31/2021 07:35   [place] ASCITES (ABDOMEN LIMITED)  Result Date: 12/30/2021 CLINICAL DATA:  Ascites check. EXAM: LIMITED ABDOMEN ULTRASOUND FOR ASCITES TECHNIQUE: Limited ultrasound survey for ascites was performed in all four abdominal quadrants. COMPARISON:  Dec 18, 2021 FINDINGS: No free fluid is seen within any of the 4 abdominal quadrants. IMPRESSION: No evidence of ascites. Electronically Signed   By: Virgina Norfolk M.D.   On: 12/30/2021 19:36    Labs:  CBC: Recent Labs    12/29/21 0357 12/30/21 0433 12/31/21 0655 01/01/22 0542  WBC 9.1 7.0 2.5* 4.1  HGB 8.4* 7.1* 6.0* 6.7*  HCT 26.1* 22.3* 18.7* 20.6*  PLT 38* 37* 26* 24*     COAGS: Recent Labs    12/18/21 1124 12/21/21 0244 12/24/21 0309 12/25/21 0317 12/26/21 0310 12/27/21 0411  INR 1.8*   < > 1.3* 1.4* 1.4* 1.4*  APTT 32  --   --   --   --   --    < > = values in this interval not displayed.     BMP: Recent Labs    12/29/21 0357 12/30/21 0433 12/31/21 0655 01/01/22 0542  NA 134* 130* 132* 135  K 4.9 5.2* 4.9 4.0  CL 104 98 97* 100  CO2 _0 GLUCOSE 156* 121* 187* 200*  BUN 20 30* 35* 38*  CALCIUM 7.8* 7.9* 8.6* 8.7*  CREATININE 0.55* 0.63 0.57* 0.61  GFRNONAA >60 >60 >60 >60      LIVER FUNCTION TESTS: Recent Labs    12/29/21 0357 12/30/21 0433 12/31/21 0655 01/01/22 0542  BILITOT 1.6* 1.9* 1.0 1.0  AST 76* 71* 60* 71*  ALT 46* 41 41 46*  ALKPHOS 51 54 77 106  PROT 4.6* 4.8* 5.0* 4.9*  ALBUMIN 1.7* 1.7* 1.7* 1.8*     Assessment and Plan: Patient with history of acute cholecystitis, recent sepsis, alcoholic cirrhosis, splenic infarction, peripheral vascular disease, left thigh myonecrosis/rhabdomyolysis; status post percutaneous cholecystostomy on 5/27(10 fr to bag);   Labs stable, t bili normal  VSS Cx ENTEROCOCCUS FAECALIS; ENTEROCOCCUS FAECIUM  OP 5 mL, orange colored fluid in the bag   Drain Location: RUQ Size: Fr size: 10 Fr  Date of placement: 12/23/21 Currently to: Drain collection device: gravity 24 hour output:  Output by Drain (mL) 12/30/21 0701 - 12/30/21 1900 12/30/21 1901 - 12/31/21 0700 12/31/21 0701 - 12/31/21 1900 12/31/21 1901 - 01/01/22 0700 01/01/22 0701 - 01/01/22 1229  Biliary Tube Other (Comment) 10 Fr. RUQ 5 0 5 5     Interval imaging/drain manipulation:  None pertinent   Current examination: Flushes easily, does not aspirate Insertion site unremarkable. Suture and stat lock in place. Dressed appropriately.   Plan: Continue TID flushes with 5 cc NS. Record output Q shift. Dressing changes QD or PRN if soiled.  Call IR APP or on call IR MD if difficulty flushing or sudden change in drain output.  Perc chole drain to remain in place at least 4-6 weeks unless GB removed surgically interim - will scheduled cholangiogram and possible removal in 4-6 weeks   Discharge planning: Please contact IR APP or on call IR MD prior to patient d/c to ensure appropriate follow up plans are in place. Typically patient will follow up with IR clinic 10-14 days post d/c for repeat imaging/possible drain injection. IR scheduler will contact patient with date/time of appointment. Patient will need to flush drain QD with 5 cc NS, record  output QD, dressing changes every 2-3 days or earlier if soiled.   IR will continue to follow - please call with questions or concerns.     Electronically Signed: Tera Mater, PA-C 01/01/2022, 12:28 PM   I spent a total of 15 Minutes at the the patient's bedside AND on the patient's hospital floor or unit, greater than 50% of which was counseling/coordinating care for gallbladder drain

## 2022-01-01 NOTE — TOC Progression Note (Signed)
Transition of Care (TOC) - Progression Note    Patient Details  Name: Berley Gambrell Stout [name]. MRN: [id number] Date of Birth: 1950-09-21  Transition of Care [facility]) CM/SW Contact  Ross Ludwig, [place] Phone Number: 01/01/2022, 4:35 PM  Clinical Narrative:     CSW continuing to follow patient's progress throughout discharge planning.  Patient has a bed at [facility] once medically ready for discharge.  Expected Discharge Plan: [place] Barriers to Discharge: Continued Medical Work up  Expected Discharge Plan and Services Expected Discharge Plan: [place] In-house Referral: Clinical Social Work   Post Acute Care Choice: [place] Living arrangements for the past 2 months: Apartment                 DME Arranged: N/A DME Agency: NA                   Social Determinants of Health (SDOH) Interventions    Readmission Risk Interventions     View : No data to display.

## 2022-01-01 NOTE — Progress Notes (Signed)
Daily Progress Note   Patient Name: [name].       Date: 01/01/2022 DOB: 1951-02-28  Age: 71 y.o. MRN#: [id number] Attending Physician: Kerney Elbe, DO Primary Care Physician: [name], FNP Admit Date: 12/18/2021  Reason for Consultation/Follow-up: Establishing goals of care  Subjective: I saw and examined [name] today.  RN was present in room at time of my encounter.  We discussed pain management.  He reports that pain medication helps when he gets it, however, he would like to consider retrying fentanyl patch as he wakes with pain in the middle of the night and we had discussed that long acting agent may help with this.  Discussed bowel regimen and he reports that it has been a couple of days since last BM. Discussed that pain medications can contribute to this and need to escalate regimen if he is not having BM at least every couple of days.  Length of Stay: 14  Current Medications: Scheduled Meds:   (feeding supplement) PROSource Plus  30 mL Oral Daily   sodium chloride   Intravenous Once   feeding supplement  237 mL Oral BID BM   fentaNYL  1 patch Transdermal Q72H   furosemide  80 mg Intravenous BID   guaiFENesin  1,200 mg Oral BID   hydrocortisone cream   Topical TID   ipratropium  0.5 mg Nebulization TID   lactulose  20 g Oral BID   levalbuterol  0.63 mg Nebulization TID   levothyroxine  50 mcg Oral Q0600   magnesium oxide  400 mg Oral BID   mouth rinse  15 mL Mouth Rinse BID   methylPREDNISolone (SOLU-MEDROL) injection  40 mg Intravenous Q12H   multivitamin with minerals  1 tablet Oral Daily   pantoprazole  40 mg Oral BID   QUEtiapine  25 mg Oral QHS   sodium chloride flush  5 mL Intracatheter Q8H    Continuous Infusions:  sodium  chloride Stopped (12/23/21 0547)   fluconazole (DIFLUCAN) IV 400 mg (01/01/22 0841)    PRN Meds: sodium chloride, acetaminophen, ALPRAZolam, diphenhydrAMINE, docusate, haloperidol lactate, metoprolol tartrate, oxyCODONE, polyethylene glycol  Physical Exam         In bed in no distress Appears chronically ill Edema noted in the upper extremities Right AKA Regular work of  breathing  Vital Signs: BP 132/60 (BP Location: Left Arm)   Pulse 80   Temp 98.1 F (36.7 C) (Oral)   Resp 14   Ht 6' (1.829 m)   Wt 108.7 kg   SpO2 98%   BMI 32.50 kg/m  SpO2: SpO2: 98 % O2 Device: O2 Device: Room Air O2 Flow Rate: O2 Flow Rate (L/min): 2 L/min  Intake/output summary:  Intake/Output Summary (Last 24 hours) at 01/01/2022 1642 Last data filed at 01/01/2022 1500 Gross per 24 hour  Intake 1953.5 ml  Output 4055 ml  Net -2101.5 ml    LBM: Last BM Date : 12/29/21 Baseline Weight: Weight: 88.8 kg Most recent weight: Weight: 108.7 kg       Palliative Assessment/Data:    Flowsheet Rows    Flowsheet Row Most Recent Value  Intake Tab   Referral Department Hospitalist  Unit at Time of Referral Med/Surg Unit  Palliative Care Primary Diagnosis Sepsis/Infectious Disease  Date Notified 12/28/21  Clinical Assessment   Psychosocial & Spiritual Assessment   Palliative Care Outcomes        Patient Active Problem List   Diagnosis Date Noted   Acute pain of left thigh/rhabdomyolysis/myonecrosis 12/23/2021   Septic shock ([place]) 12/18/2021   SVT (supraventricular tachycardia) ([place]) 12/22/2021   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 29/79/8921   AKI (acute kidney injury) ([place]) 19/41/7408   Acute metabolic encephalopathy 14/48/1856   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    Disorder of skeletal system 02/29/2020   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   Left leg weakness and numbness 12/21/2021   Thrombocytopenia ([place]) 12/20/2021   Hyperkalemia, hyponatremia and  hypomagnesemia 12/20/2021   Goals of care, counseling/discussion 02/29/2020   Physical deconditioning 02/29/2020   Scrotal edema 12/23/2021   Somnolence 12/31/2021   Volume overload 12/30/2021   Cholecystitis    Drug rash    GI bleed 12/23/2021   Calculus of bile duct with acute cholecystitis 12/21/2021   Elevated troponin 12/21/2021   Elevated liver enzymes 12/20/2021   Hyperbilirubinemia 12/20/2021   Traumatic rhabdomyolysis ([place]) 12/20/2021   Hyperkalemia 12/20/2021   Hyponatremia 12/20/2021   Hypomagnesemia 12/16/2020   Gastric nodule    Duodenal erythema    Polyp of colon    Left leg swelling 12/14/2020   Eye trauma 02/29/2020   History of pancytopenia 02/29/2020   Portal venous hypertension ([place]) 02/29/2020   S/P AKA (above knee amputation) unilateral ([place]) 02/29/2020   Hepatic cirrhosis due to chronic hepatitis C infection ([place]) 02/29/2020   HTN (hypertension) 09/14/2019   Hepatic encephalopathy ([place]) 04/07/2018   Osteoarthritis of left knee 02/26/2018   Chronic pain syndrome 11/23/2016   Hypothyroidism 09/25/2016   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   Hepatitis B core antibody positive 05/22/2016   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[place]) 04/14/2015   Cellulitis of right thigh 05/21/2012   Pancytopenia ([place]) 02/26/2012   Encounter for long-term (current) use of other medications 02/26/2012   Screening for prostate cancer 02/26/2012   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  It was discontinued after development of a rash.  Doubt that fentanyl was contributing factor to rash and will retrial fentanyl  for long acting  agent.  Continue oxycodone every 3 hours as needed for breakthrough pain management.   Constipation, opioid related: Continue lactulose.  Addition of senna 2 tabs nightly.  Will need to continue to monitor/titrate as needed. Discussed goals and advance care planning on 6/1.  He remains Full Code/Full Scope.  Introduced MOST form and discussed how to develop plan of care to focus on continuing therapies that would maximize chance of being well enough to return home and limiting therapies not in line with this goal.  His son, [name] is his HCPOA.  I gave him a copy of [facility] Cho[facility] for [facility] [employer] to review.  Recommend outpatient follow-up to continue conversation based upon his clinical course of the next few weeks with trial of rehab.    Code Status:    Code Status Orders  (From admission, onward)           Start     Ordered   12/18/21 1422  Full code  Continuous        12/18/21 1421           Code Status History     Date Active Date Inactive Code Status Order ID Comments User Context   12/14/2020 1507 12/18/2020 2106 Full Code [id number]  Artist Beach, MD ED   04/07/2018 2206 04/11/2018 1552 Full Code [id number]  [name], MD Inpatient      Advance Directive Documentation    Flowsheet Row Most Recent Value  Type of Advance Directive Healthcare Power of Attorney  Pre-existing out of facility DNR order (yellow form or pink MOST form) --  "MOST" Form in Place? --       Prognosis:  Guarded   Discharge Planning: [facility] with Palliative care service follow-up  Care plan was discussed with patient  Thank you for allowing the Palliative Medicine Team to assist in the care of this patient.  Micheline Rough, MD  Please contact Palliative Medicine Team phone at [phone] for questions and concerns.

## 2022-01-01 NOTE — Progress Notes (Signed)
PT Cancellation Note  Patient Details Name: [name] [name]. MRN: [id number] DOB: 1951-03-03   Cancelled Treatment:    Reason Eval/Treat Not Completed: Medical issues which prohibited therapy Pt reports being very sleepy and also to receive PRBCs today.   Myrtis Hopping [place] 01/01/2022, 10:22 AM Arlyce Dice, DPT Acute Rehabilitation Services Pager: [phone] Office: [phone]

## 2022-01-01 NOTE — Progress Notes (Signed)
Occupational Therapy Treatment Patient Details Name: [name]. MRN: [id number] DOB: Jan 25, 1951 Today's Date: 01/01/2022   History of present illness 71 year-old M with PMH of EtOH cirrhosis, nonimmune hemolytic anemia, pancytopenia, right AKA and hepatitis C brought to ED 12/18/21 with altered mental status after he slid off his bed and found down on the ground.. Patient  admitted to ICU with hypovolemic shock, hemolytic anemia, liver failure, encephalopathy, AKI and hyperkalemia.   OT comments  Patient was noted to be very fatigued on this date with noted received one unit of PRBCs earlier this am. Patient engaged in grooming at bed level with patient reporting increased itchiness and increased temp. Patient was min A to change hospital gown sitting up in bed with increased fatigue on this date.. Patient's discharge plan remains appropriate at this time. OT will continue to follow acutely.     Recommendations for follow up therapy are one component of a multi-disciplinary discharge planning process, led by the attending physician.  Recommendations may be updated based on patient status, additional functional criteria and insurance authorization.    Follow Up Recommendations  Skilled nursing-short term rehab (<3 hours/day)    Assistance Recommended at Discharge Frequent or constant Supervision/Assistance  Patient can return home with the following  Two people to help with walking and/or transfers;A lot of help with bathing/dressing/bathroom;Assistance with cooking/housework;Help with stairs or ramp for entrance;Direct supervision/assist for medications management;Direct supervision/assist for financial management;Assist for transportation   Equipment Recommendations  None recommended by OT    Recommendations for Other Services      Precautions / Restrictions Precautions Precautions: Fall Precaution Comments: R AKA, profound  weakness  left  hip, knee, demonstrates no ankle  dorsi/plantar flex,  lacks P/AROM Knee extension appearing contracted;  per patient, contracture PTA(WC dep)also Restrictions Weight Bearing Restrictions: No Other Position/Activity Restrictions: will not be able to stand on Left leg       Mobility Bed Mobility                    Transfers                         Balance                                           ADL either performed or assessed with clinical judgement   ADL Overall ADL's : Needs assistance/impaired Eating/Feeding: Bed level Eating/Feeding Details (indicate cue type and reason): sitting up in bed to take sips from cup with Supervison to complete task with no spillage noted. Grooming: Dance movement psychotherapist;Bed level Grooming Details (indicate cue type and reason): with increased time with cool rag with patietn reporting feeling warm and itchy         Upper Body Dressing : Minimal assistance;Bed level Upper Body Dressing Details (indicate cue type and reason): with hospital gown with increased time and cues for line management.                        Extremity/Trunk Assessment              Vision       Perception     Praxis      Cognition Arousal/Alertness: Awake/alert Behavior During Therapy: WFL for tasks assessed/performed Overall Cognitive Status: No family/caregiver present to determine baseline cognitive  functioning                                 General Comments: patient was fatigued during session with increased time to participate in all conversations        Exercises      Shoulder Instructions       General Comments      Pertinent Vitals/ Pain       Pain Assessment Pain Assessment: Faces Faces Pain Scale: Hurts little more Pain Location: scrotum, L hip, L knee, back, neck Pain Descriptors / Indicators: Discomfort, Aching, Guarding Pain Intervention(s): Monitored during session  Home Living                                           Prior Functioning/Environment              Frequency  Min 2X/week        Progress Toward Goals  OT Goals(current goals can now be found in the care plan section)  Progress towards OT goals: OT to reassess next treatment     Plan Discharge plan remains appropriate    Co-evaluation                 AM-PAC OT "6 Clicks" Daily Activity     Outcome Measure   Help from another person eating meals?: A Little Help from another person taking care of personal grooming?: A Little Help from another person toileting, which includes using toliet, bedpan, or urinal?: Total Help from another person bathing (including washing, rinsing, drying)?: A Lot Help from another person to put on and taking off regular upper body clothing?: A Lot Help from another person to put on and taking off regular lower body clothing?: Total 6 Click Score: 12    End of Session    OT Visit Diagnosis: Muscle weakness (generalized) (M62.81);Other abnormalities of gait and mobility (R26.89);Pain Pain - Right/Left: Left Pain - part of body: Hip;Knee   Activity Tolerance Patient limited by fatigue   Patient Left in bed;with call bell/phone within reach;with bed alarm set   Nurse Communication Other (comment) (ok to have more gadorade)        Time: 2440-1027 OT Time Calculation (min): 19 min  Charges: OT General Charges $OT Visit: 1 Visit OT Treatments $Self Care/Home Management : 8-22 mins  Jackelyn Poling OTR/L, MS Acute Rehabilitation Department Office# [phone] Pager# [phone]   [name] 01/01/2022, 4:03 PM

## 2022-01-01 NOTE — Progress Notes (Signed)
[facility]   Reason for visit: Follow up on rash  Interval History: rash improving; WBC 4.1, remains afebrile.      Physical Exam: Constitutional:  Vitals:   01/01/22 1040 01/01/22 1108  BP: (!) 118/58 (!) 130/57  Pulse: 82 77  Resp: 20 20  Temp: 98.8 F (37.1 C) 98.5 F (36.9 C)  SpO2: 94% 95%   patient appears in NAD Respiratory: Normal respiratory effort Skin: rash decreased on lower abdomen compared to previous picture  Review of Systems: Constitutional: negative for fevers and chills  Lab Results  Component Value Date   WBC 4.1 01/01/2022   HGB 6.7 (LL) 01/01/2022   HCT 20.6 (L) 01/01/2022   MCV 96.7 01/01/2022   PLT 24 (LL) 01/01/2022    Lab Results  Component Value Date   CREATININE 0.61 01/01/2022   BUN 38 (H) 01/01/2022   NA 135 01/01/2022   K 4.0 01/01/2022   CL 100 01/01/2022   CO2 27 01/01/2022    Lab Results  Component Value Date   ALT 46 (H) 01/01/2022   AST 71 (H) 01/01/2022   ALKPHOS 106 01/01/2022     Microbiology: Recent Results (from the past 240 hour(s))  Aerobic/Anaerobic Culture w Gram Stain (surgical/deep wound)     Status: None   Collection Time: 12/23/21 11:58 AM   Specimen: BILE  Result Value Ref Range Status   Specimen Description   Final    BILE Performed at [facility], [place] [address], [place], [place] [address]    Special Requests   Final    NONE Performed at [facility], [place] [address] [place], [place] [address]    Gram Stain   Final    ABUNDANT WBC PRESENT, PREDOMINANTLY MONONUCLEAR MODERATE GRAM POSITIVE COCCI IN PAIRS Performed at [facility], [place] [address] [place], [place] [address]    Culture   Final    MODERATE ENTEROCOCCUS FAECALIS MODERATE ENTEROCOCCUS FAECIUM ABUNDANT LACTOBACILLUS SPECIES Standardized susceptibility testing for this organism is not available. FEW CANDIDA ALBICANS NO ANAEROBES ISOLATED; CULTURE IN PROGRESS  FOR 5 DAYS    Report Status 12/28/2021 FINAL  Final   Organism ID, Bacteria ENTEROCOCCUS FAECALIS  Final   Organism ID, Bacteria ENTEROCOCCUS FAECIUM  Final      Susceptibility   Enterococcus faecalis - MIC*    AMPICILLIN <=2 SENSITIVE Sensitive     VANCOMYCIN 2 SENSITIVE Sensitive     GENTAMICIN SYNERGY SENSITIVE Sensitive     * MODERATE ENTEROCOCCUS FAECALIS   Enterococcus faecium - MIC*    AMPICILLIN <=2 SENSITIVE Sensitive     VANCOMYCIN <=0.5 SENSITIVE Sensitive     GENTAMICIN SYNERGY SENSITIVE Sensitive     * MODERATE ENTEROCOCCUS FAECIUM  Culture, blood (Routine X 2) w Reflex to ID Panel     Status: None   Collection Time: 12/23/21  2:48 PM   Specimen: BLOOD  Result Value Ref Range Status   Specimen Description   Final    BLOOD LEFT ANTECUBITAL Performed at [facility], [place] [address] [place], [place] [address]    Special Requests   Final    NONE Performed at [facility], [place] [address], [place], [place] [address]    Culture   Final    NO GROWTH 5 DAYS Performed at [facility], [place] [address], [place],  [address]    Report Status 12/28/2021 FINAL  Final  Culture, blood (  Routine X 2) w Reflex to ID Panel     Status: None   Collection Time: 12/23/21  2:48 PM   Specimen: BLOOD  Result Value Ref Range Status   Specimen Description   Final    BLOOD RIGHT ANTECUBITAL Performed at [facility], [place] [address] [place], [place] [address]    Special Requests   Final    NONE Performed at [facility], [place] [address], [place], [place] [address]    Culture   Final    NO GROWTH 5 DAYS Performed at [facility], [place] [address] [place], [place] [address]    Report Status 12/28/2021 FINAL  Final  Culture, blood (Routine X 2) w Reflex to ID Panel     Status: None (Preliminary result)   Collection Time: 12/29/21  8:50 AM   Specimen: Bronchial Wash; Blood  Result Value Ref Range Status   Specimen  Description   Final    BRONCHIAL WASHINGS Performed at [facility] [address], [place], [place] [address]    Special Requests   Final    BOTTLES DRAWN AEROBIC AND ANAEROBIC Blood Culture adequate volume Performed at [facility] [address], [place], [place] [address]    Culture   Final    NO GROWTH 3 DAYS Performed at [facility], [place] [address] [place], [place] [address]    Report Status PENDING  Incomplete  Culture, blood (Routine X 2) w Reflex to ID Panel     Status: None (Preliminary result)   Collection Time: 12/29/21  9:00 AM   Specimen: BLOOD LEFT HAND  Result Value Ref Range Status   Specimen Description   Final    BLOOD LEFT HAND Performed at [facility] [address] [place], [place] [address]    Special Requests   Final    BOTTLES DRAWN AEROBIC AND ANAEROBIC Blood Culture adequate volume Performed at [place] [address] [place], [place] [address]    Culture   Final    NO GROWTH 3 DAYS Performed at [facility], [place] [address], [place], [place] [address]    Report Status PENDING  Incomplete    Impression/Plan:  1. Rash - not c/w infection.  Improving.  May be drug rash.  No antibiotics indicated.   2.  Cholecystitis s/p IR perc cholecystotomy - polymicrobial and treated with amp/sulbactam and on fluconazole now.  Continue fluconazole through 6/7 then stop.    3. Fever - mild fever to 101 on 6/1.  No fever since that time.  Ultrasound without ascites.  No signs of SBP.    I will sign off, call with questions.

## 2022-01-02 ENCOUNTER — Inpatient Hospital Stay (HOSPITAL_COMMUNITY): Payer: Medicare Other

## 2022-01-02 DIAGNOSIS — G9340 Encephalopathy, unspecified: Secondary | ICD-10-CM | POA: Diagnosis not present

## 2022-01-02 DIAGNOSIS — G894 Chronic pain syndrome: Secondary | ICD-10-CM | POA: Diagnosis not present

## 2022-01-02 DIAGNOSIS — K7031 Alcoholic cirrhosis of liver with ascites: Secondary | ICD-10-CM | POA: Diagnosis not present

## 2022-01-02 DIAGNOSIS — A419 Sepsis, unspecified organism: Secondary | ICD-10-CM | POA: Diagnosis not present

## 2022-01-02 LAB — CBC
HCT: 19.8 % — ABNORMAL LOW (ref 39.0–52.0)
Hemoglobin: 6.6 g/dL — CL (ref 13.0–17.0)
MCH: 32.5 pg (ref 26.0–34.0)
MCHC: 33.3 g/dL (ref 30.0–36.0)
MCV: 97.5 fL (ref 80.0–100.0)
Platelets: 27 10*3/uL — CL (ref 150–400)
RBC: 2.03 MIL/uL — ABNORMAL LOW (ref 4.22–5.81)
RDW: 18.6 % — ABNORMAL HIGH (ref 11.5–15.5)
WBC: 2.8 10*3/uL — ABNORMAL LOW (ref 4.0–10.5)
nRBC: 0 % (ref 0.0–0.2)

## 2022-01-02 LAB — CK: Total CK: 581 U/L — ABNORMAL HIGH (ref 49–397)

## 2022-01-02 LAB — CBC WITH DIFFERENTIAL/PLATELET
Abs Immature Granulocytes: 0.05 10*3/uL (ref 0.00–0.07)
Basophils Absolute: 0 10*3/uL (ref 0.0–0.1)
Basophils Relative: 0 %
Eosinophils Absolute: 0 10*3/uL (ref 0.0–0.5)
Eosinophils Relative: 0 %
HCT: 23.3 % — ABNORMAL LOW (ref 39.0–52.0)
Hemoglobin: 7.5 g/dL — ABNORMAL LOW (ref 13.0–17.0)
Immature Granulocytes: 2 %
Lymphocytes Relative: 13 %
Lymphs Abs: 0.4 10*3/uL — ABNORMAL LOW (ref 0.7–4.0)
MCH: 31.3 pg (ref 26.0–34.0)
MCHC: 32.2 g/dL (ref 30.0–36.0)
MCV: 97.1 fL (ref 80.0–100.0)
Monocytes Absolute: 0.1 10*3/uL (ref 0.1–1.0)
Monocytes Relative: 4 %
Neutro Abs: 2.4 10*3/uL (ref 1.7–7.7)
Neutrophils Relative %: 81 %
Platelets: 23 10*3/uL — CL (ref 150–400)
RBC: 2.4 MIL/uL — ABNORMAL LOW (ref 4.22–5.81)
RDW: 19.1 % — ABNORMAL HIGH (ref 11.5–15.5)
WBC: 2.9 10*3/uL — ABNORMAL LOW (ref 4.0–10.5)
nRBC: 1.4 % — ABNORMAL HIGH (ref 0.0–0.2)

## 2022-01-02 LAB — TYPE AND SCREEN
ABO/RH(D): B NEG
Antibody Screen: POSITIVE
Unit division: 0
Unit division: 0

## 2022-01-02 LAB — COMPREHENSIVE METABOLIC PANEL
ALT: 50 U/L — ABNORMAL HIGH (ref 0–44)
AST: 57 U/L — ABNORMAL HIGH (ref 15–41)
Albumin: 2 g/dL — ABNORMAL LOW (ref 3.5–5.0)
Alkaline Phosphatase: 101 U/L (ref 38–126)
Anion gap: 10 (ref 5–15)
BUN: 40 mg/dL — ABNORMAL HIGH (ref 8–23)
CO2: 29 mmol/L (ref 22–32)
Calcium: 9 mg/dL (ref 8.9–10.3)
Chloride: 99 mmol/L (ref 98–111)
Creatinine, Ser: 0.57 mg/dL — ABNORMAL LOW (ref 0.61–1.24)
GFR, Estimated: >60 mL/min (ref >60)
Glucose, Bld: 186 mg/dL — ABNORMAL HIGH (ref 70–99)
Potassium: 3.8 mmol/L (ref 3.5–5.1)
Sodium: 138 mmol/L (ref 135–145)
Total Bilirubin: 1 mg/dL (ref 0.3–1.2)
Total Protein: 5.4 g/dL — ABNORMAL LOW (ref 6.5–8.1)

## 2022-01-02 LAB — PHOSPHORUS: Phosphorus: 3.8 mg/dL (ref 2.5–4.6)

## 2022-01-02 LAB — BPAM RBC
Blood Product Expiration Date: 202306242359
Blood Product Expiration Date: 202307042359
ISSUE DATE / TIME: 202306041659
ISSUE DATE / TIME: 202306051048
Unit Type and Rh: 9500
Unit Type and Rh: 9500

## 2022-01-02 LAB — MAGNESIUM: Magnesium: 2 mg/dL (ref 1.7–2.4)

## 2022-01-02 MED ORDER — METHYLPREDNISOLONE SODIUM SUCC 40 MG IJ SOLR
40.0000 mg | INTRAMUSCULAR | Status: DC
Start: 2022-01-03 — End: 2022-01-05
  Administered 2022-01-03 – 2022-01-04 (×2): 40 mg via INTRAVENOUS
  Filled 2022-01-02 (×2): qty 1

## 2022-01-02 NOTE — Progress Notes (Signed)
PROGRESS NOTE    Derek Anon Biscardi Jr.  TKZ:[id number] DOB: 12/22/1950 DOA: 12/18/2021 PCP: [name], FNP   Brief Narrative:  The patient is a 71 year old M with PMH of EtOH cirrhosis, nonimmune hemolytic anemia, pancytopenia, right AKA and hepatitis C brought to ED with altered mental status after he slid off his bed and found down on the ground by family, and admitted to ICU with hypovolemic shock, hemolytic anemia, liver failure, encephalopathy, AKI and hyperkalemia.  In ED, hypotensive and altered.  Hgb 4.0.  Lactic acid> 9.  AST 495.  ALT 107.  Bili 8.  He was started on Levophed, IV antibiotics and blood transfusion. Hematology consulted. Patient came off Levophed and transferred to [facility] hospitalist service on 5/24.    RUQ [place] concerning for liver cirrhosis and liver mass. 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  Liver cirrhosis, portal HTN, splenomegaly, ascites. General surgery consulted and recommended conservative care with IV antibiotics.   5/26-patient was on IV fluid for AKI and rhabdomyolysis but developed significant scrotal edema.  AKI resolved.  IV fluid discontinued.  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  He also had bloody stool later in the day.  Patient was moved to ICU.  PCCM, general surgery and IR consulted.  Antibiotics broadened.  527-transfused a unit of FFP and 2 units of PRBC.  Had percutaneous cholecystostomy tube.  Orthopedic surgery and GI consulted, and did not feel surgical intervention is indicated.  528-Transferred to progressive  care.  Started on p.o. Seroquel for delirium.  De-escalated antibiotics to IV Unasyn based on biliary culture.  No further GI bleed but blood counts dropped likely due to hemolytic anemia and he was transfused another 1 unit PRBCs on 12/26/2021 early in the AM.    12/27/21 Hemoglobin/hematocrit seems to have stabilized now.  He is still complaining of some leg pain so will be discussed with Ortho.  General surgery has now signed off the case and they are making a referral to [facility] for surgeon/hepatologist there to help address all of his liver issues.  IR evaluated and recommending continuing the [place] catheter with 3 times daily flushes with 5 cc of normal saline acute guarding output every shift and following up in 6 to 8 weeks for replacement or for repeat imaging/possible drain injection.  5/31-6/1: Vascular surgery evaluated and he underwent a CTA of the abdomen and pelvis with bilateral runoff and Dr. Trula Slade reviewed with the radiologist and they feel his vascular disease likely result of a high-grade right common femoral artery stenosis and because of his multiple comorbidities including cirrhosis and thrombocytopenia he is not an operative candidate and he feels that they would not stent his common femoral artery given the comorbidities.  Orthopedic surgery evaluated his new CT angio and they feel that he still not a surgical candidate at this time given that he is clinically stable and slightly improving.  They feel that if his symptoms progress into florid infection and he requires surgery would be a radical series of debridements of the left thigh or left hip disarticulation which would be extremely morbid and associated with a very  high perioperative mortality.  Palliative care was consulted for further pain assistance and management and patient is improving but overnight from 6/1 till 6/2 he spiked a temperature of 101.  He also developed a diffuse body rash and questionable drug reaction.   Given his temperature and rash ID was consulted and he was otherwise hemodynamically stable.  Patient no localizing infectious syndrome currently and he was started on fluconazole for Candida in his bile.  There is no sign of pneumonia or GU infection and he had this diffuse maculopapular erythematous rash on the trunk and arms which appear to be likely drug rash so ID discontinued his Unasyn and recommending continuing fluconazole for Candida in the drain until 01/03/2022.  ID recommended that the rash may get worse but should stabilize early next week and recommended considering anemia SBP given his cirrhosis status so we will obtain a ultrasound of the abdomen.  Blood cultures x2 were obtained and these will need to be followed but patient will follow-up with general surgeon outpatient setting.  12/30/2021 patient started itching so given some Benadryl and his rash is a little worse so we will start him on IV Solu-Medrol 60 mg every 12.  He is also appeared volume overloaded so we will give him IV diuresis with IV Lasix with milligrams twice daily.  Patient was noted to be 7 L positive since admission in the setting of his cirrhosis.  We will continue diuresis for few days now and monitor his rash.  12/31/2021.  Remains volume overloaded and blood count dropped likely in the setting of his hemolytic anemia slightly Worsening.  She is 1 unit PRBCs.  We will continue with steroids given his rash but this is improving.  IV diuresis increased to 60 mg twice daily.  Given that he had a slight wet sounding chest we will give him Xopenex and Atrovent as well as a flutter valve and guaifenesin will also check ammonia level given that he is slightly sleepy.  01/01/22: His blood count remains on the lower side so we will transfuse another 1 unit of PRBCs.  Given his volume overload still will increase the diuretics to IV 80 twice daily.  Will discuss with palliative care about reinitiation of his fentanyl patch.  He has  now signed off the case given that the rash is not consistent with infection and may be a drug rash.  Antibiotics have not been discontinued and fluconazole will be stopped on 01/03/2022.  Ultrasound showed no ascites and he has no signs of SBP.  He is better sounding today from a respiratory standpoint  01/02/2022: CK still about the same but his rash is significant improved so we will reduce the Solu-Medrol dose to 40 mg daily and stop the Benadryl given that his itch is improved.  He continues diuresed very well and he is fine negative now.  Feels about the same as yesterday and having some mild abdominal discomfort.  No other concerns    Assessment and Plan: * Septic shock ([place]) -Patient was Tachypneic, hypotensive with AKI, lactic acidosis and thrombocytopenia, and required vasopressors on admission.   -Imaging raises concern for calculus cholecystitis, possible cholecystocolonic fistula left thigh muscle necrosis versus neck fasciitis.   -Blood and urine cultures NGTD.  Biliary culture with pansensitive Enterococcus faecalis and faecium as well as lactobacillus species.   -Hemodynamically stable.  No leukocytosis and mildly leukopenic  -High risk for surgical intervention -Percutaneous cholecystostomy tube by IR on 5/27 -CTX and Flagyl >>>Vanc, Cefepime  and Flagyl 5/27>>> IV Unasyn 5/30 -> 12/29/21 -Patient spiked a temperature overnight and has diffuse maculopapular erythematous rash and likely a drug rash so his Unasyn is now been discontinued -ID has been consulted and appreciate further evaluation and recommending to evaluate for SBP and feel that the rash and the fever could be related to a drug rash so they recommended stopping Unasyn and recommended continuing fluconazole for Candida in his drain culture for 5 days -We will need to watch his rash carefully and ID feels it can get worse as we can but if it remains stable or improved next week can be discharged early -General surgery sent  referral to [facility] for outpatient follow-up -Discharge plan in place from IR standpoint -Blood cultures x2 reordered and showing NGTD at 4 Days  -Tolerating soft diet and likley to D/C to SNF once rash improves and once we know that he is not having any more fevers; A Febrile the last 72 hours   Acute pain of left thigh/rhabdomyolysis/myonecrosis -CT abdomen and pelvis shows muscular swelling with tracking gas within the proximal muscle compartments likely myonecrosis from traumatic rhabdomyolysis versus neck fasciitis.   -CK improving and almost normalized . -Orthopedic surgery signed off but will have them re-evaluate and they feel he is not a surgical candidate regardless but agree with Vascular Evaluation -MRI leg has been ordered for further investigation but will discontinue this given his repeat CTA with bilateral runoff; See Above MRI discontinued as it won't change management  -Continue monitoring closely   SVT (supraventricular tachycardia) ([place]) -One episode on 5/26.   -As symptomatic.  Normotensive.  Resolved with IV metoprolol. -C/w IV metoprolol as needed -Continue telemetry monitoring while hospitalized   Acute metabolic encephalopathy -Initially, patient was in septic shock with uremia and some degree of hepatic encephalopathy.  -Now he was having some delirium.  He is at risk for polypharmacy as well.  Delirium improved after low-dose Seroquel at night. -Seroquel 25 mg HS and IV Haldol 2 mg q6h PRN-risk and benefit discussed with sons.  QTc 432. -Continue to Treat treatable causes -Minimize sedating medications -Reorientation and delirium precautions.  AKI (acute kidney injury) ([place]) Recent Labs    12/24/21 0309 12/25/21 2119 12/26/21 0310 12/27/21 0411 12/28/21 0401 12/29/21 0357 12/30/21 4174 12/31/21 0655 01/01/22 0542 01/02/22 0419  BUN '15 17 16 16 14 20 '$ 30* 35* 38* 40*  CREATININE 0.65 0.56* 0.41* 0.46* 0.48* 0.55* 0.63 0.57* 0.61 0.57*  -Likely due to  hypotension and rhabdomyolysis.  Resolved and improved. -Recheck renal function in the morning; renal function is much improved at 30/0.63 -BUN is elevated and it went to 40 and will need to continue monitor Solu-Medrol and steroids but will need to still evaluate for upper GI bleed if he shows symptoms -Avoid nephrotoxic medications, contrast dyes, hypotension and dehydration to ensure adequate renal perfusion and renally adjust medications -Repeat CMP in the a.m.   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 Elevated liver enzymes/hyperbilirubinemia; As Above Recent Labs  Lab 12/29/21 0357 12/30/21 0433 12/31/21 0655 01/01/22 0542 01/02/22 0419  AST 76* 71* 60* 71* 57*  ALT 46* 41 41 46* 50*  ALKPHOS 51 54 77 106 101  BILITOT 1.6* 1.9* 1.0 1.0 1.0  PROT 4.6* 4.8* 5.0* 4.9* 5.4*  ALBUMIN 1.7* 1.7* 1.7* 1.8* 2.0*  LFT pattern consistent rhabdo.  Rhabdo improving.  He has mild coagulopathy as well. 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 LDH markedly elevated.  Haptoglobin low.  Had a splenomegaly and splenic infarction.  Had frank red blood in his stool on 5/26 but no further bleed after FFP and platelet transfusion. EGD 5/22 with gastritis and duodenitis.  Colo in 5/22 with polyps, diverticulosis and internal hemorrhoid.  -Transfused a total of 9 units PRBC, 1 apheresis of platelet and 1 FFP so far. [facility] signed off. -Monitor H&H and transfuse for Hgb <7.0.  -Now hemoglobin/hematocrt is on the lower side and dropped him to 6.0/18.7 and he received 2 units of PRBCs and after the 2 units  hemoglobin/hematocrit has improved to 7.5/23.3 -Repeat CBC in the AM   Splenic infarction -Wedge-shaped area of hypoenhancement involving the anterior spleen concerning for splenic infarction noted on CT abdomen and pelvis. Repeat CTA shows stable lesion  -Per General Surgery. -They are planning to refer him out to [facility] for further issues after D/C; could be contributing to his thrombocytopenia  Left leg weakness and numbness -Likely from rhabdomyolysis/myonecrosis of left thigh. ABI with severe PAD and showed Resting left ankle-brachial index indicates severe left lower  extremity arterial disease. The left toe-brachial index is absent.  -He may need repeat ABI once he recovers -Closely monitor discussed with vascular surgery Dr. Trula Slade who will see the patient later this evening for  Further evaluation and recommendations -Orthopedic Surgery following and the numbness could be related to the myonecrosis however after discussion with the vascular surgery team they are requesting Neurology input; after discussion with neurology they are recommending obtaining MRI of the leg as well as MRI of the L-spine with and without contrast and have the orthopedic evaluate for possible compartment syndrome; patient was to undergo MRIs however vascular evaluated and recommended getting a CT of the abdomen pelvis bilateral runoff; given that an MRI would not change his management will discontinue the MRIs -I spoke with Dr. Kathaleen Bury and he feels the patient has baseline numbness in that leg with myonecrosis of thigh from being found down and he would not be a surgical candidate to release any compartments as it is well over 48 hours since onset of being found down and has nothing else to offer from his standpoint but agrees with Vascular Evaluation  -Per my discussion with Dr. Kathaleen Bury even if his MRI shows some hematomas he is likely not a surgical candidate given that cannot heal  incisions and would be exceptionally high risk for infection and he feels the left thigh has been stable for the past week and the CT intra-muscular gas was incidentally identified on CT pelvis. -Dr. Kathaleen Bury feels that the patient has essentially no function of that LLE for past 2 years -Orthopedic surgery evaluated and evaluated his repeat CTA and they still feel that he is still not a surgical candidate and they feel that if his symptoms progressed to forward infection and he require surgery it would be likely be a radical serial debridement of the left thigh or left hip disarticulation which will either be extremely morbid associated with very high perioperative mortality -Vascular surgery evaluated and they feel that his vascular disease is likely the result of a very high-grade right common femoral  artery stenosis but because of his multiple comorbidities including cirrhosis and thrombocytopenia is not a current operative candidate and they would not recommend stenting of common femoral artery at this time  Hyperkalemia, hyponatremia and hypomagnesemia Hyperkalemia mild and is now at 5.2 so was given a dose of Lokelma and is now 4.9 -> 4.0 -> 3.8 Hyponatremia resolved; last sodium went from 137 is now 135 -> 134 and trended down to 130 -> 132 -> 135 -> 138 -Patient's Mag Level is now 2.0 -Continue to monitor and replete as necessary; -Repeat CMP, mag, Phos in the AM   Thrombocytopenia ([place]) -Patient's Platelet Count went from 25 -> 25 -> 27 -> 22 -> 25 -> 28 -> 24 -> 38 -> 37 -> 26 -> 24 -> 23 -Had 2 apheresis of platelet on 5/27. -Transfuse for platelet < 10k or bleeding -Continue to Monitor for S/Sx of Bleeding; No overt bleeding noted  -Repeat CBC in the AM   Goals of care, counseling/discussion -Remains full code with full scope of care.  See discussion on 5/26 and 5/27 -Palliative medicine following and appreciate further evaluation   Abnormal MRI, liver-resolved as of  12/23/2021 -RUQ [place] concerning for 6 cm liver mass.  Not appreciated on CT abdomen with contrast. -Follow up in the outpatient with GI and General Surgery  Physical deconditioning -Wheelchair-bound at baseline.  He says he is able to transfer using left leg at baseline. -PT/OT recommended SNF -Left leg evaluation as above   Scrotal edema -Likely due in the setting of liver cirrhosis, hypoalbuminemia and IVF hydration.  -IV fluid discontinued and now Diuresis started and increasing as above -Continue monitoring and will need Scrotal Sling and Scrotal Support -May discuss with Urology for further outpatient workup   Somnolence - Patient was little bit more sleepy yesterday and lethargic but is stable.  Checked ammonia level and was 22 -Could be in the setting of his lorazepam and Benadryl administration but now will be stopping Benadryl.  Discontinued on lorazepam for now -Continue to monitor closely  Volume overload - Multifactorial in setting of blood transfusions, IV fluid hydration as well as hypoalbuminemia -Recent echocardiogram done showed an EF of 55 to 60% with grade 1 diastolic dysfunction so he could have a CHF component -Check BNP in the morning -Was initiated on Lasix today titrated IV 80 mg twice daily -Strict I's and O's and daily weights; patient is now -1.957 L since admission -Continue to monitor for signs and symptoms of volume overload and repeat chest x-ray in the a.m. -May consider right upper quadrant ultrasound in the setting of his liver cirrhosis to evaluate for SBP -Repeat chest x-ray today showed "Increased retrocardiac left lower lobe opacity, could represent atelectasis, pneumonia or aspiration. No other focal lung Infiltrate." -Ultrasound abdominal ascites showed "No free fluid is seen within any of the 4 abdominal quadrants" -We will continue with diuresis and he will get another unit of blood so we will diurese  Drug rash - See above we will stop his  Unasyn now given that he has a rash -Added hydrocortisone cream but given that his rash worsen he was started on systemic steroids and we are now weaning and going to 40 mg of Solu-Medrol daily -Rash worsened and developed significantly after fentanyl patch was placed and this is unlikely the cause but given that this is something you elevated will hold and stop fentanyl patch -Likely a drug reaction though not related to the fentanyl patch; Try Reinitiating Fentanyl Patch -  Continue to monitor and patient is itching so he was initiated on Benadryl  Elevated troponin -Likely demand ischemia in the setting of sepsis and shock most likely in the setting of rhabdomyolysis -Denies any current chest pain  Calculus of bile duct with acute cholecystitis -See shock  Hyponatremia - Mild and now sodium went from 137 and dropped to 134 -> 130 -> 132 -> 135 -> 138 -Likely the patient is hypervolemic hyponatremic so we will start diuresis -Continue to monitor and trend and repeat CMP in a.m.  Traumatic rhabdomyolysis ([place]) -CK was improving and went from 10,683 at its peak and is now 1873 -> 2149 -> 1737 -> 1270 -> 700 -> 541 and is now 581 -No longer getting IVF Hydration  Hyperbilirubinemia -Improving. T Bili went from 8.8 -> 0.9 -> 1.6 -> 1.9 and possibly in the setting of hemolytic anemia and now improved and is back down to 1.0 x3 -Continue to Monitor and Trend -Repeat CMP in the AM    Elevated liver enzymes - Improving significantly; patient's AST was 2284 and ALT was 545 and now trended down; AST is now 57 and ALT is now 50 -He is status post percutaneous cholecystostomy tube -Continue monitor and trend LFTs carefully  Chronic pain syndrome -C/w Oxycodone to 20 mg every 6 hours.  Per database, fills 120 for 30 days -Palliative care consulted for further goals of care and pain adjustment Fentanyl Patch was started but stopped given Rash but unlikely contributing so will re-challenge; now  back on the fentanyl patch and palliative following for pain and adjustments -Patient is having some constipation so they have added bowel regimen -Discontinued V Dilaudid -Continue pain control but may need to adjust given his sleepiness  Hemolytic anemia ([facility]) -In the setting of Liver Cirrhosis -Patient's hemoglobin/hematocrit is now mildly improved after his blood transfusion is now 7.5/23.3; continue to monitor for signs and symptoms of overt bleeding and likely this is in the setting of hemolytic anemia -S/p transfusion of 9 units of pRBCs, 1 unit of FFP and 1 unit of Transfused Platelet Pheresis  -Continue monitor and trend and continue monitor for signs or symptoms of bleeding; no overt bleeding noted -May need some more blood transfusions but have initiated Lasix and steroids; increase the dose of Lasix yesterday given his blood transfusion -Repeat CBC in a.m.   Anxiety -Patient is adamant about his Xanax despite risk for polypharmacy and encephalopathy. -Resumed Alprazolam 1 mg po TIDprn Anxiety   DVT prophylaxis: SCDs Start: 12/18/21 1421    Code Status: Full Code Family Communication: Discussed with son at bedside  Disposition Plan:  Level of care: Progressive Status is: Inpatient Remains inpatient appropriate because: Needs SNF once hemoglobin stabilizes and once his rash is improved and he is diuresed   Consultants:  Associate Professor Surgery Orthopedic Surgery Oncology  Vascular Surgery Palliative Care Medicine Gastroenterology Interventional Radiology Infectious Diseases  Discussed with Neurology  Procedures:  ABI MRI Liver CT Abd/Pelvis Procedure: 10.2 fr Cholecystostomy drain done by Dr. Sharen Heck Mir Repeat CTA Abd/Pelvis Ordering an ultrasound ascites  Antimicrobials:  Anti-infectives (From admission, onward)    Start     Dose/Rate Route Frequency Ordered Stop   12/29/21 1000  fluconazole (DIFLUCAN) IVPB 400 mg        400 mg 100 mL/hr over  120 Minutes Intravenous Every 24 hours 12/29/21 0918 01/02/22 1304   12/26/21 0800  Ampicillin-Sulbactam (UNASYN) 3 g in sodium chloride 0.9 % 100 mL IVPB  Status:  Discontinued        3 g 200 mL/hr over 30 Minutes Intravenous Every 6 hours 12/26/21 0711 12/29/21 1204   12/23/21 1100  vancomycin (VANCOREADY) IVPB 1250 mg/250 mL  Status:  Discontinued        1,250 mg 166.7 mL/hr over 90 Minutes Intravenous Every 12 hours 12/23/21 0922 12/26/21 0703   12/23/21 1000  ceFEPIme (MAXIPIME) 2 g in sodium chloride 0.9 % 100 mL IVPB  Status:  Discontinued        2 g 200 mL/hr over 30 Minutes Intravenous Every 8 hours 12/23/21 0922 12/26/21 0703   12/22/21 2048  metroNIDAZOLE (FLAGYL) IVPB 500 mg  Status:  Discontinued        500 mg 100 mL/hr over 60 Minutes Intravenous Every 12 hours 12/22/21 2049 12/26/21 0703   12/21/21 1100  cefTRIAXone (ROCEPHIN) 2 g in sodium chloride 0.9 % 100 mL IVPB  Status:  Discontinued        2 g 200 mL/hr over 30 Minutes Intravenous Every 24 hours 12/21/21 0900 12/23/21 0904   12/20/21 1830  metroNIDAZOLE (FLAGYL) IVPB 500 mg  Status:  Discontinued        500 mg 100 mL/hr over 60 Minutes Intravenous Every 12 hours 12/20/21 1727 12/22/21 2049   12/20/21 1100  cefTRIAXone (ROCEPHIN) 1 g in sodium chloride 0.9 % 100 mL IVPB  Status:  Discontinued        1 g 200 mL/hr over 30 Minutes Intravenous Every 24 hours 12/20/21 1006 12/21/21 0900   12/19/21 2000  vancomycin (VANCOREADY) IVPB 1250 mg/250 mL  Status:  Discontinued        1,250 mg 166.7 mL/hr over 90 Minutes Intravenous Every 24 hours 12/18/21 1946 12/19/21 1207   12/19/21 1100  ceFEPIme (MAXIPIME) 2 g in sodium chloride 0.9 % 100 mL IVPB  Status:  Discontinued        2 g 200 mL/hr over 30 Minutes Intravenous Every 12 hours 12/19/21 1038 12/20/21 1006   12/19/21 0600  ceFEPIme (MAXIPIME) 2 g in sodium chloride 0.9 % 100 mL IVPB  Status:  Discontinued        2 g 200 mL/hr over 30 Minutes Intravenous Every 12 hours  12/18/21 1943 12/19/21 1038   12/18/21 1845  vancomycin (VANCOREADY) IVPB 2000 mg/400 mL        2,000 mg 200 mL/hr over 120 Minutes Intravenous STAT 12/18/21 1828 12/18/21 2151   12/18/21 1830  ceFEPIme (MAXIPIME) 2 g in sodium chloride 0.9 % 100 mL IVPB        2 g 200 mL/hr over 30 Minutes Intravenous STAT 12/18/21 1818 12/18/21 1951       Subjective: Seen and examined at bedside and is still feeling fatigued but he is more awake and alert.  States that he woke up at 5 AM this morning and he is wide-awake.  Denies any chest pain did have some slight abdominal discomfort.  Thinks his swelling is improving.  No other concerns or complaints at this time.  Objective: Vitals:   01/01/22 2142 01/02/22 0500 01/02/22 0536 01/02/22 1124  BP: 131/65  (!) 142/72   Pulse: 74  68   Resp: 18  20   Temp: 97.6 F (36.4 C)  (!) 97.5 F (36.4 C)   TempSrc: Oral  Oral   SpO2: 94%  95% 96%  Weight:  104.3 kg    Height:        Intake/Output Summary (Last 24 hours) at 01/02/2022 1450 Last  data filed at 01/02/2022 1049 Gross per 24 hour  Intake 600 ml  Output 3500 ml  Net -2900 ml   Filed Weights   12/30/21 0500 01/01/22 0519 01/02/22 0500  Weight: 110.7 kg 108.7 kg 104.3 kg   Examination: Physical Exam:  Constitutional: WN/WD obese Caucasian male who is chronically ill-appearing and still little sleepy but easily arousable Respiratory: Patient diminished to auscultation bilaterally with coarse breath sounds and had some mild crackles but improved respiratory sounds, no wheezing, rales, rhonchi or . Normal respiratory effort and patient is not tachypenic. No accessory muscle use.  Unlabored breathing and not wearing supplemental oxygen via nasal cannula Cardiovascular: RRR, no murmurs / rubs / gallops. S1 and S2 auscultated.  Has 1-2+ lower extremity edema on the left leg and continues to have significant scrotal edema and some edema in his right stump Abdomen: Soft, non-tender, distended  secondary body habitus and has a percutaneous cholecystostomy drain in place draining light-colored biliary drainage. Bowel sounds positive.  GU: Deferred. Musculoskeletal: Has a right AKA Neurologic: CN 2-12 grossly intact with no focal deficits. Psychiatric: Normal judgment and insight.  Is a little somnolent and drowsy but easily arousable  Data Reviewed: I have personally reviewed following labs and imaging studies  CBC: Recent Labs  Lab 12/29/21 0357 12/30/21 0433 12/31/21 0655 01/01/22 0542 01/02/22 0419  WBC 9.1 7.0 2.5* 4.1 2.9*  NEUTROABS 7.7 5.4 2.0 3.6 2.4  HGB 8.4* 7.1* 6.0* 6.7* 7.5*  HCT 26.1* 22.3* 18.7* 20.6* 23.3*  MCV 97.0 99.6 98.9 96.7 97.1  PLT 38* 37* 26* 24* 23*   Basic Metabolic Panel: Recent Labs  Lab 12/29/21 0357 12/30/21 0433 12/31/21 0655 01/01/22 0542 01/02/22 0419  NA 134* 130* 132* 135 138  K 4.9 5.2* 4.9 4.0 3.8  CL 104 98 97* 100 99  CO2 '25 25 27 27 29  '$ GLUCOSE 156* 121* 187* 200* 186*  BUN 20 30* 35* 38* 40*  CREATININE 0.55* 0.63 0.57* 0.61 0.57*  CALCIUM 7.8* 7.9* 8.6* 8.7* 9.0  MG 1.8 1.8 1.8 1.7 2.0  PHOS 4.1 4.7* 5.1* 4.1 3.8   GFR: Estimated Creatinine Clearance: 104.2 mL/min (A) (by C-G formula based on SCr of 0.57 mg/dL (L)). Liver Function Tests: Recent Labs  Lab 12/29/21 0357 12/30/21 0433 12/31/21 0655 01/01/22 0542 01/02/22 0419  AST 76* 71* 60* 71* 57*  ALT 46* 41 41 46* 50*  ALKPHOS 51 54 77 106 101  BILITOT 1.6* 1.9* 1.0 1.0 1.0  PROT 4.6* 4.8* 5.0* 4.9* 5.4*  ALBUMIN 1.7* 1.7* 1.7* 1.8* 2.0*   No results for input(s): LIPASE, AMYLASE in the last 168 hours. Recent Labs  Lab 12/27/21 0411 01/01/22 0542  AMMONIA 34 22   Coagulation Profile: Recent Labs  Lab 12/27/21 0411  INR 1.4*   Cardiac Enzymes: Recent Labs  Lab 12/29/21 0357 12/30/21 0433 12/31/21 0655 01/01/22 0542 01/02/22 0443  CKTOTAL 1,737* 1,270* 700* 541* 581*   BNP (last 3 results) No results for input(s): PROBNP in the last  8760 hours. HbA1C: No results for input(s): HGBA1C in the last 72 hours. CBG: No results for input(s): GLUCAP in the last 168 hours. Lipid Profile: No results for input(s): CHOL, HDL, LDLCALC, TRIG, CHOLHDL, LDLDIRECT in the last 72 hours. Thyroid Function Tests: No results for input(s): TSH, T4TOTAL, FREET4, T3FREE, THYROIDAB in the last 72 hours. Anemia Panel: No results for input(s): VITAMINB12, FOLATE, FERRITIN, TIBC, IRON, RETICCTPCT in the last 72 hours. Sepsis Labs: No results for input(s): PROCALCITON, LATICACIDVEN  in the last 168 hours.  Recent Results (from the past 240 hour(s))  Culture, blood (Routine X 2) w Reflex to ID Panel     Status: None (Preliminary result)   Collection Time: 12/29/21  8:50 AM   Specimen: Bronchial Wash; Blood  Result Value Ref Range Status   Specimen Description   Final    BRONCHIAL WASHINGS Performed at [facility] [address], [place], [place] [address]    Special Requests   Final    BOTTLES DRAWN AEROBIC AND ANAEROBIC Blood Culture adequate volume Performed at [facility] [address] [place], [place] [address]    Culture   Final    NO GROWTH 4 DAYS Performed at [facility], [place] [address], [place], [place] [address]    Report Status PENDING  Incomplete  Culture, blood (Routine X 2) w Reflex to ID Panel     Status: None (Preliminary result)   Collection Time: 12/29/21  9:00 AM   Specimen: BLOOD LEFT HAND  Result Value Ref Range Status   Specimen Description   Final    BLOOD LEFT HAND Performed at [facility] [address], [place], [place] [address]    Special Requests   Final    BOTTLES DRAWN AEROBIC AND ANAEROBIC Blood Culture adequate volume Performed at [facility] [address] [place], [place] [address]    Culture   Final    NO GROWTH 4 DAYS Performed at [facility], [place] [address], [place], [place] [address]    Report  Status PENDING  Incomplete     Radiology Studies: DG CHEST PORT 1 VIEW  Result Date: 01/02/2022 CLINICAL DATA:  Shortness of breath. EXAM: PORTABLE CHEST 1 VIEW COMPARISON:  01/01/2022 FINDINGS: Stable mild cardiac enlargement prominent mediastinal contours. No definite infiltrates or effusions. IMPRESSION: Stable cardiac enlargement. No acute pulmonary findings. Electronically Signed   By: Marijo Sanes M.D.   On: 01/02/2022 08:47   DG CHEST PORT 1 VIEW  Result Date: 01/01/2022 CLINICAL DATA:  Septic shock EXAM: PORTABLE CHEST 1 VIEW COMPARISON:  December 31, 2021 FINDINGS: The heart size and mediastinal contours are stable. The heart size is enlarged. Both lungs are clear. The visualized skeletal structures are stable. IMPRESSION: No active disease. Electronically Signed   By: Abelardo Diesel M.D.   On: 01/01/2022 07:19     Scheduled Meds:  (feeding supplement) PROSource Plus  30 mL Oral Daily   sodium chloride   Intravenous Once   feeding supplement  237 mL Oral BID BM   fentaNYL  1 patch Transdermal Q72H   furosemide  80 mg Intravenous BID   guaiFENesin  1,200 mg Oral BID   hydrocortisone cream   Topical TID   ipratropium  0.5 mg Nebulization BID   lactulose  20 g Oral BID   levalbuterol  0.63 mg Nebulization BID   levothyroxine  50 mcg Oral Q0600   magnesium oxide  400 mg Oral BID   mouth rinse  15 mL Mouth Rinse BID   [START ON 01/03/2022] methylPREDNISolone (SOLU-MEDROL) injection  40 mg Intravenous Q24H   multivitamin with minerals  1 tablet Oral Daily   pantoprazole  40 mg Oral BID   QUEtiapine  25 mg Oral QHS   senna  2 tablet Oral QHS   sodium chloride flush  5 mL Intracatheter Q8H   Continuous Infusions:  sodium chloride Stopped (12/23/21 0547)    LOS: 15  days   Raiford Noble, DO [facility] Available via Epic secure chat 7am-7pm After these hours, please refer to coverage provider listed on [email or web address] 01/02/2022, 2:50 PM

## 2022-01-02 NOTE — Progress Notes (Signed)
Nutrition Follow-up  DOCUMENTATION CODES:   Not applicable  INTERVENTION:  - continue Ensure BID and 30 ml Prosource Plus once/day.   NUTRITION DIAGNOSIS:   Increased nutrient needs related to acute illness as evidenced by estimated needs. -ongoing  GOAL:   Patient will meet greater than or equal to 90% of their needs -met on average  MONITOR:   PO intake, Supplement acceptance, Labs, Weight trends  ASSESSMENT:   71 y.o. 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 He presented to the ED via EMS from home after his home health aide found him on the ground after he slid out of bed. He has history of variceal changes on [place], denies blood in stool or dark stools. He was admitted for shock thought to be acute on chronic anemia and possible sepsis.  He has been eating 75-100% at meals over the past 3 days. Review of orders indicates he has been accepting Ensure and Prosource 100% of the time offered.  Weight has been fluctuating throughout hospitalization; now trending back down after trending up 6/1-6/5. Moderate pitting edema to BUE and LLE and very deep pitting edema to perineal area per documentation in the edema section of flow sheet.   Patient is being followed by Palliative Care. He is Full Code and current d/c plan is for SNF for rehab.    Labs reviewed; BUN: 40 mg/dl, creatinine: 0.57 mg/dl, LFTs elevated.  Medications reviewed; 80 mg IV lasix BID, 20 g lactulose BID, 50 mcg oral synthroid/day, 400 mg mag-ox BID, 2 g IV Mg sulfate x1 run 6/5, 40 mg solu-medrol/day, 1 tablet multivitamin with minerals/day, 40 mg oral protonix BID, 2 tablets senokot/day.   Diet Order:   Diet Order             DIET DYS 3 Room service appropriate? Yes; Fluid consistency: Thin  Diet effective now                    EDUCATION NEEDS:   Not appropriate for education at this time  Skin:  Skin Assessment: Skin Integrity Issues: Skin Integrity Issues:: DTI DTI: L foot  Last BM:  6/6 (type 4 x2, both large amounts)  Height:   Ht Readings from Last 1 Encounters:  12/22/21 6' (1.829 m)    Weight:   Wt Readings from Last 1 Encounters:  01/02/22 104.3 kg     BMI:  Body mass index is 31.19 kg/m.  Estimated Nutritional Needs:  Kcal:  1800-2000 kcal Protein:  90-105 grams Fluid:  >/= 2 L/day     Jarome Matin, MS, RD, LDN Registered Dietitian II Inpatient Clinical Nutrition RD pager # and on-call/weekend pager # available in [facility]

## 2022-01-02 NOTE — Progress Notes (Signed)
Daily Progress Note   Patient Name: [name].       Date: 01/02/2022 DOB: [birth year]  Age: 71 y.o. MRN#: [id number] Attending Physician: Kerney Elbe, DO Primary Care Physician: [name], FNP Admit Date: 12/18/2021  Reason for Consultation/Follow-up: Establishing goals of care  Subjective: I saw and examined [name] today.  RN was present in room at time of my encounter.  We discussed pain management.  He reports that pain is reasonably better controlled since he has been resumed on Fentanyl patch. He is asking for breathing treatment.     Length of Stay: 15  Current Medications: Scheduled Meds:   (feeding supplement) PROSource Plus  30 mL Oral Daily   sodium chloride   Intravenous Once   feeding supplement  237 mL Oral BID BM   fentaNYL  1 patch Transdermal Q72H   furosemide  80 mg Intravenous BID   guaiFENesin  1,200 mg Oral BID   hydrocortisone cream   Topical TID   ipratropium  0.5 mg Nebulization BID   lactulose  20 g Oral BID   levalbuterol  0.63 mg Nebulization BID   levothyroxine  50 mcg Oral Q0600   magnesium oxide  400 mg Oral BID   mouth rinse  15 mL Mouth Rinse BID   [START ON 01/03/2022] methylPREDNISolone (SOLU-MEDROL) injection  40 mg Intravenous Q24H   multivitamin with minerals  1 tablet Oral Daily   pantoprazole  40 mg Oral BID   QUEtiapine  25 mg Oral QHS   senna  2 tablet Oral QHS   sodium chloride flush  5 mL Intracatheter Q8H    Continuous Infusions:  sodium chloride Stopped (12/23/21 0547)   fluconazole (DIFLUCAN) IV 400 mg (01/02/22 1104)    PRN Meds: sodium chloride, acetaminophen, ALPRAZolam, docusate, haloperidol lactate, metoprolol tartrate, oxyCODONE, polyethylene glycol  Physical Exam         In bed in no  distress Appears chronically ill Edema noted in the upper extremities Right AKA Regular work of breathing  Vital Signs: BP (!) 142/72 (BP Location: Left Arm)   Pulse 68   Temp (!) 97.5 F (36.4 C) (Oral)   Resp 20   Ht 6' (1.829 m)   Wt 104.3 kg   SpO2 96%   BMI 31.19 kg/m  SpO2:  SpO2: 96 % O2 Device: O2 Device: Room Air O2 Flow Rate: O2 Flow Rate (L/min): 2 L/min  Intake/output summary:  Intake/Output Summary (Last 24 hours) at 01/02/2022 1148 Last data filed at 01/02/2022 1049 Gross per 24 hour  Intake 960 ml  Output 3500 ml  Net -2540 ml    LBM: Last BM Date : 01/01/22 Baseline Weight: Weight: 88.8 kg Most recent weight: Weight: 104.3 kg       Palliative Assessment/Data:    Flowsheet Rows    Flowsheet Row Most Recent Value  Intake Tab   Referral Department Hospitalist  Unit at Time of Referral Med/Surg Unit  Palliative Care Primary Diagnosis Sepsis/Infectious Disease  Date Notified 12/28/21  Clinical Assessment   Psychosocial & Spiritual Assessment   Palliative Care Outcomes        Patient Active Problem List   Diagnosis Date Noted   Somnolence 12/31/2021   Volume overload 12/30/2021   Cholecystitis    Drug rash    Acute pain of left thigh/rhabdomyolysis/myonecrosis 12/23/2021   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   GI bleed 12/23/2021   Scrotal edema 12/23/2021   SVT (supraventricular tachycardia) ([place]) 12/22/2021   Left leg weakness and numbness 12/21/2021   Calculus of bile duct with acute cholecystitis 12/21/2021   Elevated troponin 12/21/2021   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 85/63/1497   Thrombocytopenia ([place]) 12/20/2021   AKI (acute kidney injury) ([place]) 02/63/7858   Acute metabolic encephalopathy 85/08/7739   Elevated liver enzymes 12/20/2021   Hyperbilirubinemia 12/20/2021   Traumatic rhabdomyolysis ([place]) 12/20/2021   Hyperkalemia, hyponatremia and hypomagnesemia 12/20/2021   Hyperkalemia 12/20/2021    Hyponatremia 12/20/2021   Septic shock ([place]) 12/18/2021   Hypomagnesemia 12/16/2020   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    Gastric nodule    Duodenal erythema    Polyp of colon    Left leg swelling 12/14/2020   Eye trauma 02/29/2020   History of pancytopenia 02/29/2020   Portal venous hypertension ([place]) 02/29/2020   S/P AKA (above knee amputation) unilateral ([place]) 02/29/2020   Hepatic cirrhosis due to chronic hepatitis C infection ([place]) 02/29/2020   Goals of care, counseling/discussion 02/29/2020   Disorder of skeletal system 02/29/2020   Physical deconditioning 02/29/2020   HTN (hypertension) 09/14/2019   Hepatic encephalopathy ([place]) 04/07/2018   Osteoarthritis of left knee 02/26/2018   Chronic pain syndrome 11/23/2016   Hypothyroidism 09/25/2016   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   Hepatitis B core antibody positive 05/22/2016   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[place]) 04/14/2015   Cellulitis of right thigh 05/21/2012   Pancytopenia ([place]) 02/26/2012   Encounter for long-term (current) use of other medications 02/26/2012   Screening for prostate cancer 02/26/2012   Hemolytic anemia ([place]) 02/26/2012   Closed fracture of carpal bone 10/12/2009   Dyslipidemia 09/12/2009   Phantom limb syndrome ([place]) 09/12/2009   Hypocalcemia 12/23/2007   ERECTILE DYSFUNCTION, 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   Constipation, opioid related: Continue lactulose. Also on senna 2 tabs nightly.  Will need to continue to monitor/titrate as needed. Discussed goals and advance care planning on 6/1.  He remains Full Code/Full Scope.  Introduced MOST form and discussed how to develop plan of care to focus on continuing therapies that would maximize chance of being  well enough to return home and limiting therapies not in line with this goal.  His son, [name] is his HCPOA.  I gave him a copy of Hard Choices for Loving People to review.  Recommend outpatient follow-up to continue conversation based upon his clinical course of the next few weeks with trial of rehab.    Code Status:    Code Status Orders  (From admission, onward)           Start     Ordered   12/18/21 1422  Full code  Continuous        12/18/21 1421           Code Status History     Date Active Date Inactive Code Status Order ID Comments User Context   12/14/2020 1507 12/18/2020 2106 Full Code [id number]  Artist Beach, MD ED   04/07/2018 2206 04/11/2018 1552 Full Code [id number]  [name], MD Inpatient      Advance Directive Documentation    Flowsheet Row Most Recent Value  Type of Advance Directive Healthcare Power of Attorney  Pre-existing out of facility DNR order (yellow form or pink MOST form) --  "MOST" Form in Place? --       Prognosis:  Guarded   Discharge Planning: [facility] for rehab with Palliative care service follow-up  Care plan was discussed with patient  Thank you for allowing the Palliative Medicine Team to assist in the care of this patient.  Loistine Chance, MD  Please contact Palliative Medicine Team phone at [phone] for questions and concerns.

## 2022-01-03 ENCOUNTER — Inpatient Hospital Stay (HOSPITAL_COMMUNITY): Payer: Medicare Other

## 2022-01-03 LAB — CBC WITH DIFFERENTIAL/PLATELET
Abs Immature Granulocytes: 0.14 10*3/uL — ABNORMAL HIGH (ref 0.00–0.07)
Basophils Absolute: 0 10*3/uL (ref 0.0–0.1)
Basophils Relative: 0 %
Eosinophils Absolute: 0.1 10*3/uL (ref 0.0–0.5)
Eosinophils Relative: 3 %
HCT: 25.6 % — ABNORMAL LOW (ref 39.0–52.0)
Hemoglobin: 8.8 g/dL — ABNORMAL LOW (ref 13.0–17.0)
Immature Granulocytes: 4 %
Lymphocytes Relative: 18 %
Lymphs Abs: 0.6 10*3/uL — ABNORMAL LOW (ref 0.7–4.0)
MCH: 32.8 pg (ref 26.0–34.0)
MCHC: 34.4 g/dL (ref 30.0–36.0)
MCV: 95.5 fL (ref 80.0–100.0)
Monocytes Absolute: 0.2 10*3/uL (ref 0.1–1.0)
Monocytes Relative: 5 %
Neutro Abs: 2.4 10*3/uL (ref 1.7–7.7)
Neutrophils Relative %: 70 %
Platelets: 26 10*3/uL — CL (ref 150–400)
RBC: 2.68 MIL/uL — ABNORMAL LOW (ref 4.22–5.81)
RDW: 19.8 % — ABNORMAL HIGH (ref 11.5–15.5)
WBC: 3.5 10*3/uL — ABNORMAL LOW (ref 4.0–10.5)
nRBC: 4.8 % — ABNORMAL HIGH (ref 0.0–0.2)

## 2022-01-03 LAB — CULTURE, BLOOD (ROUTINE X 2)
Culture: NO GROWTH
Culture: NO GROWTH
Special Requests: ADEQUATE
Special Requests: ADEQUATE

## 2022-01-03 LAB — COMPREHENSIVE METABOLIC PANEL
ALT: 64 U/L — ABNORMAL HIGH (ref 0–44)
AST: 100 U/L — ABNORMAL HIGH (ref 15–41)
Albumin: 1.9 g/dL — ABNORMAL LOW (ref 3.5–5.0)
Alkaline Phosphatase: 113 U/L (ref 38–126)
Anion gap: 9 (ref 5–15)
BUN: 35 mg/dL — ABNORMAL HIGH (ref 8–23)
CO2: 32 mmol/L (ref 22–32)
Calcium: 8.8 mg/dL — ABNORMAL LOW (ref 8.9–10.3)
Chloride: 96 mmol/L — ABNORMAL LOW (ref 98–111)
Creatinine, Ser: 0.52 mg/dL — ABNORMAL LOW (ref 0.61–1.24)
GFR, Estimated: >60 mL/min (ref >60)
Glucose, Bld: 122 mg/dL — ABNORMAL HIGH (ref 70–99)
Potassium: 3.8 mmol/L (ref 3.5–5.1)
Sodium: 137 mmol/L (ref 135–145)
Total Bilirubin: 1.7 mg/dL — ABNORMAL HIGH (ref 0.3–1.2)
Total Protein: 5.2 g/dL — ABNORMAL LOW (ref 6.5–8.1)

## 2022-01-03 LAB — MAGNESIUM: Magnesium: 1.6 mg/dL — ABNORMAL LOW (ref 1.7–2.4)

## 2022-01-03 LAB — PHOSPHORUS: Phosphorus: 2.7 mg/dL (ref 2.5–4.6)

## 2022-01-03 MED ORDER — MAGNESIUM SULFATE 2 GM/50ML IV SOLN
2.0000 g | Freq: Once | INTRAVENOUS | Status: AC
  Administered 2022-01-03: 2 g via INTRAVENOUS
  Filled 2022-01-03: qty 50

## 2022-01-03 NOTE — Plan of Care (Signed)
  Problem: Education: Goal: Knowledge of General Education information will improve Description: Including pain rating scale, medication(s)/side effects and non-pharmacologic comfort measures Outcome: Progressing   Problem: Nutrition: Goal: Adequate nutrition will be maintained Outcome: Progressing   Problem: Coping: Goal: Level of anxiety will decrease Outcome: Progressing   

## 2022-01-03 NOTE — Care Management Important Message (Signed)
Important Message  Patient Details IM Letter given to the Patient. Name: [name]. MRN: [id number] Date of Birth: 1951/02/17   Medicare Important Message Given:  Yes     Kerin Salen 01/03/2022, 12:43 PM

## 2022-01-03 NOTE — Progress Notes (Signed)
PROGRESS NOTE  Derek Anon Glaab Jr. ZHG:[id number] DOB: [birth year] DOA: 12/18/2021 PCP: [name], FNP   LOS: 16 days   Brief Narrative / Interim history: 71 year old male with history of hep C cirrhosis, nonimmune hemolytic anemia, pancytopenia, right AKA, who was brought to the ED and admitted on 5/22 due to altered mental status, slid off his bed and was found on the ground by the family.  He was initially admitted to the ICU with shock, hemolytic anemia, liver failure, encephalopathy, AKI.  Hemoglobin was found to be 4.  There was concern for a calculus cholecystitis, general surgery was consulted but he was deemed not a surgical candidate, now status post percutaneous cholecystostomy tube.  Hospital course complicated by left leg pain, found to have significant rhabdomyolysis possibly related to being found down.  Vascular surgery and orthopedic surgery consulted, underwent a CT angiogram with significant PAD, not a surgical candidate, conservative management for now and pain control  Subjective / 24h Interval events: States that he was not able to sleep well last night.  Son is at bedside, tells me that he was on better to for itching which has now been discontinued  Assesement and Plan: Principal Problem:   Septic shock ([place]) Active Problems:   Acute pain of left thigh/rhabdomyolysis/myonecrosis   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   AKI (acute kidney injury) ([place])   Acute metabolic encephalopathy   SVT (supraventricular tachycardia) (HCC)   Goals of care, counseling/discussion   Thrombocytopenia (HCC)   Hyperkalemia, hyponatremia and hypomagnesemia   Left leg weakness and numbness   Splenic infarction   Physical deconditioning   Scrotal edema   Anxiety   Hemolytic anemia (HCC)   Chronic pain syndrome   Elevated liver enzymes   Hyperbilirubinemia   Traumatic rhabdomyolysis (HCC)    Hyperkalemia   Hyponatremia   Calculus of bile duct with acute cholecystitis   Elevated troponin   GI bleed   Cholecystitis   Drug rash   Volume overload   Somnolence  Principal problem Septic shock-initially admitted to the ICU requiring vasopressors.  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  General surgery consulted, he is now status post percutaneous cholecystostomy tube by IR on 5/27.  Biliary cultures showed pansensitive Enterococcus faecalis and faecium, as well as lactobacillus species.  He has completed a course of antibiotics for this, as well as fluconazole for Candida.  ID consulted.  He had another fever a few days ago, blood cultures were obtained and they have been without growth.  He is now afebrile  Active problems Left thigh pain, rhabdomyolysis, myonecrosis-imaging shows likely myonecrosis from traumatic rhabdomyolysis versus nec fasciitis.  Orthopedic surgery consulted, did not feel like he is a surgical candidate.  Peripheral vascular disease-vascular surgery consulted due to severe left lower extremity arterial disease based on ABI.  He is not a surgical candidate.  SVT-1 episode on 5/26, continue metoprolol  Acute metabolic encephalopathy-also with a degree of hepatic encephalopathy, has been started on Seroquel and overall improved.  Minimize sedating medications  Acute kidney injury-due to septic shock, creatinine has now normalized.  Decompensated liver failure in patient with hep C cirrhosis-Per son, never been much of a drinker.  LFTs are mildly elevated but overall stable.  Rhabdomyolysis also contributing to LFT elevation  Fluid overload, anasarca-has significant fluid overload in his left leg as well as upper arms.  Lasix was started, continue.  Most recent 2D echo showed normal EF 53-29%, grade 1 diastolic dysfunction.  There is a component of hypoalbuminemia in the setting of his liver disease as well  Acute blood  loss anemia-due to hemolytic anemia and GI bleed.  He did have an episode of frank red blood per rectum on 5/26, but no further bleeding.  GI consulted, underwent an EGD on 5/22 which showed gastritis and duodenitis.  Colonoscopy on 5/22 showed polyps, diverticulosis and internal hemorrhoids.  In total, he has been transfused 9 units of packed red blood cells, 1 FFP and 1 platelet transfusion.  GI signed off.  Hemoglobin currently stable  Splenic infarction-Wedge-shaped area of hypoenhancement involving the anterior spleen concerning for splenic infarction noted on CT abdomen and pelvis. Repeat CTA shows stable lesion.  Outpatient referral to [facility]  Left leg weakness, numbness-likely from rhabdomyolysis/myonecrosis of the left thigh.  Orthopedic surgery and vascular surgery consulted, not a candidate for further interventions. Dr. Kathaleen Bury feels that the patient has essentially no function of that LLE for past 2 years.  It was felt like if his symptoms progressed to forward infection and he require surgery it would be likely be a radical serial debridement of the left thigh or left hip disarticulation which will either be extremely morbid associated with very high perioperative mortality. 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  No overt bleeding  Goals of care-palliative following, remains full code full scope of care  Abnormal MRI of the liver-right upper quadrant ultrasound was concerning for 6 cm liver mass, not appreciated on CT abdomen  Chronic pain-appreciate palliative care  consultation.  Currently on fentanyl patch  Skin rash-unclear trigger, Unasyn has been discontinued.  Possibly a drug reaction to the fentanyl patch, it has been discontinued but now resumed.  Closely monitor.  Currently on steroids  Elevated troponin-likely demand ischemia, no ACS type symptoms   Scheduled Meds:  (feeding supplement) PROSource Plus  30 mL Oral Daily   sodium chloride   Intravenous Once   feeding supplement  237 mL Oral BID BM   fentaNYL  1 patch Transdermal Q72H   furosemide  80 mg Intravenous BID   guaiFENesin  1,200 mg Oral BID   hydrocortisone cream   Topical TID   ipratropium  0.5 mg Nebulization BID   lactulose  20 g Oral BID   levalbuterol  0.63 mg Nebulization BID   levothyroxine  50 mcg Oral Q0600   magnesium oxide  400 mg Oral BID   mouth rinse  15 mL Mouth Rinse BID   methylPREDNISolone (SOLU-MEDROL) injection  40 mg Intravenous Q24H   multivitamin with minerals  1 tablet Oral Daily   pantoprazole  40 mg Oral BID   QUEtiapine  25 mg Oral QHS   senna  2 tablet Oral QHS   sodium chloride flush  5 mL Intracatheter Q8H   Continuous Infusions:  sodium chloride Stopped (12/23/21 0547)   PRN Meds:.sodium chloride, acetaminophen, ALPRAZolam, docusate, haloperidol lactate, metoprolol tartrate, oxyCODONE, polyethylene glycol  Diet Orders (From admission, onward)     Start     Ordered   12/24/21 1430  DIET DYS 3 Room  service appropriate? Yes; Fluid consistency: Thin  Diet effective now       Question Answer Comment  Room service appropriate? Yes   Fluid consistency: Thin      12/24/21 1429            DVT prophylaxis: SCDs Start: 12/18/21 1421   Lab Results  Component Value Date   PLT 26 (LL) 01/03/2022      Code Status: Full Code  Family Communication: son at bedside   Status is: Inpatient  Remains inpatient appropriate because: IV Lasix, count monitoring, rash monitoring  Level of care: Progressive  Procedures:  2D  echo  Microbiology  none  Antimicrobials: none    Objective: Vitals:   01/02/22 2000 01/02/22 2150 01/03/22 0515 01/03/22 0758  BP:  (!) 150/86 (!) 149/80   Pulse:  (!) 102 87   Resp:  18 18   Temp:  97.7 F (36.5 C) (!) 97.5 F (36.4 C)   TempSrc:  Oral Oral   SpO2: 93% 96% 93% 93%  Weight:      Height:        Intake/Output Summary (Last 24 hours) at 01/03/2022 1025 Last data filed at 01/03/2022 0946 Gross per 24 hour  Intake 5 ml  Output 5550 ml  Net -5545 ml   Wt Readings from Last 3 Encounters:  01/02/22 104.3 kg  08/23/21 99.8 kg  08/09/21 95.3 kg    Examination:  Constitutional: NAD Eyes: no scleral icterus ENMT: Mucous membranes are moist.  Neck: normal, supple Respiratory: clear to auscultation bilaterally, no wheezing, no crackles. Normal respiratory effort. No accessory muscle use.  Cardiovascular: Regular rate and rhythm, no murmurs / rubs / gallops.  1+ left lower extremity edema Abdomen: non distended, no tenderness. Bowel sounds positive.  Musculoskeletal: no clubbing / cyanosis.  Skin: no rashes Neurologic: Nonfocal, equal strength   Data Reviewed: I have independently reviewed following labs and imaging studies   CBC Recent Labs  Lab 12/30/21 0433 12/31/21 0655 01/01/22 0542 01/02/22 0419 01/03/22 0346  WBC 7.0 2.5* 4.1 2.9* 3.5*  HGB 7.1* 6.0* 6.7* 7.5* 8.8*  HCT 22.3* 18.7* 20.6* 23.3* 25.6*  PLT 37* 26* 24* 23* 26*  MCV 99.6 98.9 96.7 97.1 95.5  MCH 31.7 31.7 31.5 31.3 32.8  MCHC 31.8 32.1 32.5 32.2 34.4  RDW 18.3* 17.9* 18.1* 19.1* 19.8*  LYMPHSABS 1.0 0.3* 0.3* 0.4* 0.6*  MONOABS 0.3 0.1 0.1 0.1 0.2  EOSABS 0.1 0.0 0.0 0.0 0.1  BASOSABS 0.0 0.0 0.0 0.0 0.0    Recent Labs  Lab 12/30/21 0433 12/31/21 0655 01/01/22 0542 01/02/22 0419 01/03/22 0346  NA 130* 132* 135 138 137  K 5.2* 4.9 4.0 3.8 3.8  CL 98 97* 100 99 96*  CO2 '25 27 27 29 '$ 32  GLUCOSE 121* 187* 200* 186* 122*  BUN 30* 35* 38* 40* 35*  CREATININE 0.63  0.57* 0.61 0.57* 0.52*  CALCIUM 7.9* 8.6* 8.7* 9.0 8.8*  AST 71* 60* 71* 57* 100*  ALT 41 41 46* 50* 64*  ALKPHOS 54 77 106 101 113  BILITOT 1.9* 1.0 1.0 1.0 1.7*  ALBUMIN 1.7* 1.7* 1.8* 2.0* 1.9*  MG 1.8 1.8 1.7 2.0 1.6*  AMMONIA  --   --  22  --   --     ------------------------------------------------------------------------------------------------------------------ No results for input(s): CHOL, HDL, LDLCALC, TRIG, CHOLHDL, LDLDIRECT in the last 72 hours.  Lab Results  Component Value Date   HGBA1C 4.9 12/20/2021   ------------------------------------------------------------------------------------------------------------------ No results for  input(s): TSH, T4TOTAL, T3FREE, THYROIDAB in the last 72 hours.  Invalid input(s): FREET3  Cardiac Enzymes No results for input(s): CKMB, TROPONINI, MYOGLOBIN in the last 168 hours.  Invalid input(s): CK ------------------------------------------------------------------------------------------------------------------    Component Value Date/Time   BNP 154.0 (H) 12/18/2021 1124    CBG: No results for input(s): GLUCAP in the last 168 hours.  Recent Results (from the past 240 hour(s))  Culture, blood (Routine X 2) w Reflex to ID Panel     Status: None   Collection Time: 12/29/21  8:50 AM   Specimen: Bronchial Wash; Blood  Result Value Ref Range Status   Specimen Description   Final    BRONCHIAL WASHINGS Performed at [facility] [address] [place], [place] [address]    Special Requests   Final    BOTTLES DRAWN AEROBIC AND ANAEROBIC Blood Culture adequate volume Performed at [facility] [address] [place], [place] [address]    Culture   Final    NO GROWTH 5 DAYS Performed at [facility], [place] [address] [place], [place] [address]    Report Status 01/03/2022 FINAL  Final  Culture, blood (Routine X 2) w Reflex to ID Panel     Status: None   Collection Time: 12/29/21   9:00 AM   Specimen: BLOOD LEFT HAND  Result Value Ref Range Status   Specimen Description   Final    BLOOD LEFT HAND Performed at [facility] [address] [place], [place] [address]    Special Requests   Final    BOTTLES DRAWN AEROBIC AND ANAEROBIC Blood Culture adequate volume Performed at [facility] [address] [place], [place] [address]    Culture   Final    NO GROWTH 5 DAYS Performed at [facility], [place] [address] [place], [place] [address]    Report Status 01/03/2022 FINAL  Final     Radiology Studies: DG CHEST PORT 1 VIEW  Result Date: 01/03/2022 CLINICAL DATA:  Shortness of breath EXAM: PORTABLE CHEST 1 VIEW COMPARISON:  January 02, 2022 FINDINGS: The heart size and mediastinal contours are stable. The heart size is enlarged. There is probable mild patchy consolidation of the left lung base. There is no pulmonary edema or pleural effusion. The visualized skeletal structures are stable. IMPRESSION: Probable mild patchy consolidation of the left lung base, developing pneumonia is not excluded. Electronically Signed   By: Abelardo Diesel M.D.   On: 01/03/2022 07:36     Marzetta Board, MD, PhD [facility]  Between 7 am - 7 pm I am available, please contact me via Amion (for emergencies) or Securechat (non urgent messages)  Between 7 pm - 7 am I am not available, please contact night coverage MD/APP via Amion

## 2022-01-03 NOTE — TOC Progression Note (Signed)
Transition of Care (TOC) - Progression Note    Patient Details  Name: Oryon Gary Stroupe [name]. MRN: [id number] Date of Birth: Dec 29, 1950  Transition of Care [facility]) CM/SW Contact  Leeroy Cha, RN Phone Number: 01/03/2022, 7:39 AM  Clinical Narrative:    Continues to progress.  Has a bed at [facility] when ready.   Expected Discharge Plan: [place] Barriers to Discharge: Continued Medical Work up  Expected Discharge Plan and Services Expected Discharge Plan: [place] In-house Referral: Clinical Social Work   Post Acute Care Choice: [place] Living arrangements for the past 2 months: Apartment                 DME Arranged: N/A DME Agency: NA                   Social Determinants of Health (SDOH) Interventions    Readmission Risk Interventions     View : No data to display.

## 2022-01-03 NOTE — Progress Notes (Signed)
Physical Therapy Treatment Patient Details Name: [name] [name]. MRN: [id number] DOB: November 23, 1950 Today's Date: 01/03/2022   History of Present Illness 71 year-old M with PMH of EtOH cirrhosis, nonimmune hemolytic anemia, pancytopenia, right AKA and hepatitis C brought to ED 12/18/21 with altered mental status after he slid off his bed and found down on the ground.. Patient  admitted to ICU with hypovolemic shock, hemolytic anemia, liver failure, encephalopathy, AKI and hyperkalemia.    PT Comments    General Comments: AxO x 3 very willing to try but having alot back pain today. RN reported she just performed peri care from BM.  Pt c/o increased pain and was willing to get OOB to recliner.  Attempted to use MAXI MOVE LIFT.  General bed mobility comments: pt required + 2 assist side to side rolling to place Maxi MOve Pad under him.  Pt was able to ues bed rail partially but was limited by L hip pain and back pain. General transfer comment: attempted to use Maxi Move LIFT however unable due to low battery and replacement battery also not charged. Left pt in bed and positioned to comfort side lying.   Pt stated he plans to D/C to a "half way house or whatever they call it".  Educated he was going to a [employer].  He also stated he will probably be unable to return to his apartment.    Recommendations for follow up therapy are one component of a multi-disciplinary discharge planning process, led by the attending physician.  Recommendations may be updated based on patient status, additional functional criteria and insurance authorization.  Follow Up Recommendations  Skilled nursing-short term rehab (<3 hours/day)     Assistance Recommended at Discharge Frequent or constant Supervision/Assistance  Patient can return home with the following Two people to help with walking and/or transfers;Assist for transportation;Help with stairs or ramp for entrance;A lot of help with  bathing/dressing/bathroom   Equipment Recommendations  None recommended by PT    Recommendations for Other Services       Precautions / Restrictions Precautions Precautions: Fall Precaution Comments: R AKA, profound  weakness  left  hip, knee, demonstrates no ankle dorsi/plantar flex,  lacks P/AROM Knee extension appearing contracted;  per patient, contracture PTA(WC dep)also Restrictions Other Position/Activity Restrictions: will not be able to stand on Left leg     Mobility  Bed Mobility Overal bed mobility: Needs Assistance Bed Mobility: Rolling Rolling: Mod assist, +2 for safety/equipment, +2 for physical assistance         General bed mobility comments: pt required + 2 assist side to side rolling to place Maxi MOve Pad under him.  Pt was able to ues bed rail partially but was limited by L hip pain and back pain.    Transfers                   General transfer comment: attempted to use Maxi Move LIFT however unable due to low battery and replacement battery also not charged.    Ambulation/Gait                   Stairs             Wheelchair Mobility    Modified Rankin (Stroke Patients Only)       Balance  Cognition Arousal/Alertness: Awake/alert Behavior During Therapy: WFL for tasks assessed/performed Overall Cognitive Status: Within Functional Limits for tasks assessed                                 General Comments: AxO x 3 very willing to try but having alot back pain today.        Exercises      General Comments        Pertinent Vitals/Pain Pain Assessment Pain Assessment: Faces Faces Pain Scale: Hurts whole lot Pain Location: scrotum, L hip, L knee, back Pain Descriptors / Indicators: Discomfort, Aching, Guarding Pain Intervention(s): Monitored during session, Repositioned    Home Living                          Prior  Function            PT Goals (current goals can now be found in the care plan section) Progress towards PT goals: Progressing toward goals    Frequency    Min 2X/week      PT Plan Current plan remains appropriate    Co-evaluation              AM-PAC PT "6 Clicks" Mobility   Outcome Measure  Help needed turning from your back to your side while in a flat bed without using bedrails?: A Lot Help needed moving from lying on your back to sitting on the side of a flat bed without using bedrails?: Total Help needed moving to and from a bed to a chair (including a wheelchair)?: Total Help needed standing up from a chair using your arms (e.g., wheelchair or bedside chair)?: Total Help needed to walk in hospital room?: Total Help needed climbing 3-5 steps with a railing? : Total 6 Click Score: 7    End of Session Equipment Utilized During Treatment: Gait belt Activity Tolerance: Patient tolerated treatment well Patient left: in bed;with call bell/phone within reach;with bed alarm set Nurse Communication: Need for lift equipment;Mobility status PT Visit Diagnosis: Muscle weakness (generalized) (M62.81);History of falling (Z91.81)     Time: 9629-5284 PT Time Calculation (min) (ACUTE ONLY): 22 min  Charges:  $Therapeutic Activity: 8-22 mins                     Rica Koyanagi  PTA Acute  Rehabilitation Services Pager      [phone] Office      [phone]

## 2022-01-03 NOTE — Plan of Care (Signed)
  Problem: Clinical Measurements: Goal: Respiratory complications will improve Outcome: Progressing   Problem: Elimination: Goal: Will not experience complications related to bowel motility Outcome: Progressing Goal: Will not experience complications related to urinary retention Outcome: Progressing   

## 2022-01-04 DIAGNOSIS — L8990 Pressure ulcer of unspecified site, unspecified stage: Secondary | ICD-10-CM | POA: Insufficient documentation

## 2022-01-04 LAB — CBC WITH DIFFERENTIAL/PLATELET
Abs Immature Granulocytes: 0.19 10*3/uL — ABNORMAL HIGH (ref 0.00–0.07)
Basophils Absolute: 0 10*3/uL (ref 0.0–0.1)
Basophils Relative: 0 %
Eosinophils Absolute: 0 10*3/uL (ref 0.0–0.5)
Eosinophils Relative: 1 %
HCT: 28.3 % — ABNORMAL LOW (ref 39.0–52.0)
Hemoglobin: 8.9 g/dL — ABNORMAL LOW (ref 13.0–17.0)
Immature Granulocytes: 6 %
Lymphocytes Relative: 16 %
Lymphs Abs: 0.5 10*3/uL — ABNORMAL LOW (ref 0.7–4.0)
MCH: 31.2 pg (ref 26.0–34.0)
MCHC: 31.4 g/dL (ref 30.0–36.0)
MCV: 99.3 fL (ref 80.0–100.0)
Monocytes Absolute: 0.2 10*3/uL (ref 0.1–1.0)
Monocytes Relative: 5 %
Neutro Abs: 2.4 10*3/uL (ref 1.7–7.7)
Neutrophils Relative %: 72 %
Platelets: 24 10*3/uL — CL (ref 150–400)
RBC: 2.85 MIL/uL — ABNORMAL LOW (ref 4.22–5.81)
RDW: 20.4 % — ABNORMAL HIGH (ref 11.5–15.5)
WBC: 3.4 10*3/uL — ABNORMAL LOW (ref 4.0–10.5)
nRBC: 1.8 % — ABNORMAL HIGH (ref 0.0–0.2)

## 2022-01-04 LAB — COMPREHENSIVE METABOLIC PANEL
ALT: 61 U/L — ABNORMAL HIGH (ref 0–44)
AST: 66 U/L — ABNORMAL HIGH (ref 15–41)
Albumin: 2 g/dL — ABNORMAL LOW (ref 3.5–5.0)
Alkaline Phosphatase: 103 U/L (ref 38–126)
Anion gap: 12 (ref 5–15)
BUN: 31 mg/dL — ABNORMAL HIGH (ref 8–23)
CO2: 31 mmol/L (ref 22–32)
Calcium: 8.6 mg/dL — ABNORMAL LOW (ref 8.9–10.3)
Chloride: 94 mmol/L — ABNORMAL LOW (ref 98–111)
Creatinine, Ser: 0.5 mg/dL — ABNORMAL LOW (ref 0.61–1.24)
GFR, Estimated: >60 mL/min (ref >60)
Glucose, Bld: 145 mg/dL — ABNORMAL HIGH (ref 70–99)
Potassium: 3.9 mmol/L (ref 3.5–5.1)
Sodium: 137 mmol/L (ref 135–145)
Total Bilirubin: 1.7 mg/dL — ABNORMAL HIGH (ref 0.3–1.2)
Total Protein: 5.2 g/dL — ABNORMAL LOW (ref 6.5–8.1)

## 2022-01-04 LAB — PHOSPHORUS: Phosphorus: 4.1 mg/dL (ref 2.5–4.6)

## 2022-01-04 LAB — MAGNESIUM: Magnesium: 1.9 mg/dL (ref 1.7–2.4)

## 2022-01-04 MED ORDER — OXYCODONE HCL 5 MG PO TABS
25.0000 mg | ORAL_TABLET | ORAL | Status: DC | PRN
  Administered 2022-01-04 (×4): 25 mg via ORAL
  Filled 2022-01-04 (×4): qty 5

## 2022-01-04 MED ORDER — ALPRAZOLAM 1 MG PO TABS: 1.0000 mg | ORAL_TABLET | Freq: Three times a day (TID) | ORAL | 0 refills | Status: DC | PRN

## 2022-01-04 MED ORDER — ACETAMINOPHEN 325 MG PO TABS: 650.0000 mg | ORAL_TABLET | Freq: Four times a day (QID) | ORAL | Status: DC | PRN

## 2022-01-04 MED ORDER — FUROSEMIDE 80 MG PO TABS: 80.0000 mg | ORAL_TABLET | Freq: Every day | ORAL | 11 refills | Status: DC

## 2022-01-04 MED ORDER — ADULT MULTIVITAMIN W/MINERALS CH: 1.0000 | ORAL_TABLET | Freq: Every day | ORAL | Status: DC

## 2022-01-04 MED ORDER — LACTULOSE 10 GM/15ML PO SOLN: 20.0000 g | Freq: Every day | ORAL | 0 refills | Status: DC

## 2022-01-04 MED ORDER — MEDIHONEY WOUND/BURN DRESSING EX PSTE
1.0000 "application " | PASTE | Freq: Every day | CUTANEOUS | Status: DC
  Administered 2022-01-04: 1 via TOPICAL
  Filled 2022-01-04: qty 44

## 2022-01-04 MED ORDER — OXYCODONE HCL 10 MG PO TABS
25.0000 mg | ORAL_TABLET | ORAL | 0 refills | Status: DC | PRN
Start: 2022-01-04 — End: 2022-02-01

## 2022-01-04 MED ORDER — QUETIAPINE FUMARATE 25 MG PO TABS: 25.0000 mg | ORAL_TABLET | Freq: Every day | ORAL | Status: DC

## 2022-01-04 MED ORDER — PREDNISONE 10 MG PO TABS
ORAL_TABLET | ORAL | 0 refills | Status: AC
Start: 2022-01-04 — End: 2022-01-08

## 2022-01-04 MED ORDER — FENTANYL 25 MCG/HR TD PT72
1.0000 | MEDICATED_PATCH | TRANSDERMAL | 0 refills | Status: DC
Start: 2022-01-04 — End: 2022-02-01

## 2022-01-04 NOTE — Discharge Instructions (Signed)
Flush biliary tube with 5 mls normal saline once daily.  Routine dressing changes with dry gauze.

## 2022-01-04 NOTE — Progress Notes (Signed)
Report called to [place] at [facility].  Awaiting PTAR for transport.

## 2022-01-04 NOTE — Progress Notes (Signed)
Occupational Therapy Treatment Patient Details Name: [name] [name]. MRN: [id number] DOB: Dec 10, 1950 Today's Date: 01/04/2022   History of present illness 71 year-old M with PMH of EtOH cirrhosis, nonimmune hemolytic anemia, pancytopenia, right AKA and hepatitis C brought to ED 12/18/21 with altered mental status after he slid off his bed and found down on the ground.. Patient  admitted to ICU with hypovolemic shock, hemolytic anemia, liver failure, encephalopathy, AKI and hyperkalemia.   OT comments  Pt making slow progress toward all OT goals. Pt very limited by pain in knee and back making mobility very difficult. Pt had an aid at home 5x a week for 3 hours that helped with some adls and home skills. Pt states he cannot go back to current apartment but family is looking for an assisted living facility.  Pt is dependent with most mobility right now and LE adl care.  Will continue with attempts to mobilize with lift.     Recommendations for follow up therapy are one component of a multi-disciplinary discharge planning process, led by the attending physician.  Recommendations may be updated based on patient status, additional functional criteria and insurance authorization.    Follow Up Recommendations  Skilled nursing-short term rehab (<3 hours/day)    Assistance Recommended at Discharge Frequent or constant Supervision/Assistance  Patient can return home with the following  Two people to help with walking and/or transfers;A lot of help with bathing/dressing/bathroom;Assistance with cooking/housework;Help with stairs or ramp for entrance;Direct supervision/assist for medications management;Direct supervision/assist for financial management;Assist for transportation   Equipment Recommendations  None recommended by OT    Recommendations for Other Services      Precautions / Restrictions Precautions Precautions: Fall Precaution Comments: R AKA, profound  weakness  left  hip, knee,  demonstrates no ankle dorsi/plantar flex,  lacks P/AROM Knee extension appearing contracted;  per patient, contracture PTA(WC dep)also Restrictions Weight Bearing Restrictions: No Other Position/Activity Restrictions: unable to stand on L leg       Mobility Bed Mobility Overal bed mobility: Needs Assistance Bed Mobility: Rolling Rolling: Max assist         General bed mobility comments: All mobility is painful to back and hip. Pt assisted with rolling but unwilling to attempt to get to EOB due to pain.    Transfers Overall transfer level: Needs assistance                 General transfer comment: Pt will need maxi move     Balance Overall balance assessment: Needs assistance, History of Falls Sitting-balance support: Bilateral upper extremity supported, Feet unsupported Sitting balance-Leahy Scale: Poor         Standing balance comment: unable                           ADL either performed or assessed with clinical judgement   ADL Overall ADL's : Needs assistance/impaired Eating/Feeding: Set up;Bed level Eating/Feeding Details (indicate cue type and reason): Pt sat up in bed to feed self. Pt does not toleateing sitting up for too long but has increased independence feeding self when encouraged to have HOB up at 70 degrees. Grooming: Wash/dry hands;Wash/dry face;Oral care;Set up;Bed level Grooming Details (indicate cue type and reason): Pt groomed at bed level with very little assist. Upper Body Bathing: Set up;Bed level   Lower Body Bathing: Maximal assistance;Bed level             Toilet Transfer Details (indicate cue  type and reason): hoyer required Toileting- Water quality scientist and Hygiene: Total assistance;Bed level       Functional mobility during ADLs: Maximal assistance;+2 for physical assistance General ADL Comments: Pt very limited at this time due to pain and severe weakness. Pt unable to attempt sitting on EOB. Pt will need maxi  move lift to get OOB.  Have concerns about pressure sores on sacrum due to lack of mobility. Educdated pt on getting L knee away from bedrail as pressure area noted from knee leaning on bedrail.    Extremity/Trunk Assessment Upper Extremity Assessment Upper Extremity Assessment: Overall WFL for tasks assessed RUE Deficits / Details: Shoulder 4/5, bicep 5/5, tricep 5/5, wrist 5/5, grip 4/5 RUE Sensation: WNL RUE Coordination: WNL LUE Deficits / Details: Shoulder 5/5, bicep 5/5, tricep 5/5, wrist 5/5, grip 4/5 LUE Sensation: WNL LUE Coordination: WNL   Lower Extremity Assessment Lower Extremity Assessment: Defer to PT evaluation        Vision   Vision Assessment?: No apparent visual deficits   Perception Perception Perception: Within Functional Limits   Praxis Praxis Praxis: Intact    Cognition Arousal/Alertness: Awake/alert Behavior During Therapy: WFL for tasks assessed/performed Overall Cognitive Status: Within Functional Limits for tasks assessed                                 General Comments: Pt in pain so much that mobility was not attempted.  Attempted rolling and pt yelling out with all mobiity.  Bed level therapy completed.        Exercises Exercises: General Upper Extremity General Exercises - Upper Extremity Shoulder Flexion: Strengthening, Both, 10 reps, Supine Shoulder Extension: Strengthening, Both, 10 reps, Supine Elbow Flexion: Strengthening, Both, 10 reps, Seated Elbow Extension: Strengthening, Both, 10 reps, Seated Other Exercises Other Exercises: Chest Press holding cylindrical foam pillow in room x 20 Other Exercises: Shouler Press holding cylindrical foam pillow in room x 20    Shoulder Instructions       General Comments Pt very limited with all mobility and adls. Prior to this fall, pt states he could transfer from bed to chair and into car.  Pt drove with his L leg.    Pertinent Vitals/ Pain       Pain Assessment Pain  Assessment: Faces Faces Pain Scale: Hurts whole lot Pain Location: scrotum, L hip, L knee, back Pain Descriptors / Indicators: Discomfort, Aching, Guarding Pain Intervention(s): Limited activity within patient's tolerance, Monitored during session, Premedicated before session, Repositioned  Home Living                                          Prior Functioning/Environment              Frequency  Min 2X/week        Progress Toward Goals  OT Goals(current goals can now be found in the care plan section)  Progress towards OT goals: Progressing toward goals  Acute Rehab OT Goals Patient Stated Goal: to get rid of the pain so he can do more OT Goal Formulation: With patient Time For Goal Achievement: 01/04/22 Potential to Achieve Goals: Fair ADL Goals Pt Will Transfer to Toilet: with max assist;with transfer board;bedside commode Pt Will Perform Toileting - Clothing Manipulation and hygiene: with max assist;sitting/lateral leans Pt/caregiver will Perform Home Exercise Program: Both right  and left upper extremity;Increased strength Additional ADL Goal #1: Patient will perform 10 min functional activity or exercise activity at edge of bed as evidence of improving activity tolerance and balance  Plan Discharge plan remains appropriate    Co-evaluation                 AM-PAC OT "6 Clicks" Daily Activity     Outcome Measure   Help from another person eating meals?: None Help from another person taking care of personal grooming?: A Little Help from another person toileting, which includes using toliet, bedpan, or urinal?: Total Help from another person bathing (including washing, rinsing, drying)?: A Lot Help from another person to put on and taking off regular upper body clothing?: A Lot Help from another person to put on and taking off regular lower body clothing?: Total 6 Click Score: 13    End of Session    OT Visit Diagnosis: Muscle weakness  (generalized) (M62.81);Other abnormalities of gait and mobility (R26.89);Pain Pain - Right/Left: Left Pain - part of body: Hip;Knee   Activity Tolerance Patient limited by fatigue   Patient Left in bed;with call bell/phone within reach;with bed alarm set   Nurse Communication Need for lift equipment        Time: 0803-0820 OT Time Calculation (min): 17 min  Charges: OT General Charges $OT Visit: 1 Visit OT Treatments $Self Care/Home Management : 8-22 mins   Glenford Peers 01/04/2022, 8:34 AM

## 2022-01-04 NOTE — Plan of Care (Signed)
?  Problem: Clinical Measurements: ?Goal: Will remain free from infection ?Outcome: Progressing ?Goal: Diagnostic test results will improve ?Outcome: Progressing ?Goal: Respiratory complications will improve ?Outcome: Progressing ?  ?

## 2022-01-04 NOTE — TOC Transition Note (Signed)
Transition of Care [facility]) - CM/SW Discharge Note   Patient Details  Name: Mansa Willers Stjames [name]. MRN: [id number] Date of Birth: 03-06-1951  Transition of Care [facility]) CM/SW Contact:  Leeroy Cha, RN Phone Number: 01/04/2022, 12:47 PM   Clinical Narrative:    Patient to go to room [facility] at [facility].  Number to call report is [phone], given to  RN.  Ptar called at [phone]. For transport.  Transport packet to unit sec.   Final next level of care: Skilled Nursing Facility Barriers to Discharge: Barriers Resolved   Patient Goals and CMS Choice Patient states their goals for this hospitalization and ongoing recovery are:: Go to rehab and then transition to LTC CMS Medicare.gov Compare Post Acute Care list provided to:: Patient Represenative (must comment) Choice offered to / list presented to : Adult Children  Discharge Placement                       Discharge Plan and Services In-house Referral: Clinical Social Work   Post Acute Care Choice: [place]          DME Arranged: N/A DME Agency: NA                  Social Determinants of Health (SDOH) Interventions     Readmission Risk Interventions     No data to display

## 2022-01-04 NOTE — Discharge Summary (Signed)
Physician Discharge Summary  [name]. AXE:[id number] DOB: 12-26-1950 DOA: 12/18/2021  PCP: Derek Hawthorne, FNP  Admit date: 12/18/2021 Discharge date: 01/04/2022  Admitted From: home Disposition:  SNF  Recommendations for Outpatient Follow-up:  Follow up with PCP in 1-2 weeks Please continue pain regimen as below Continue wound care  Home Health: none Equipment/Devices: none  Discharge Condition: stable CODE STATUS: Full code Diet Orders (From admission, onward)     Start     Ordered   12/24/21 1430  DIET DYS 3 Room service appropriate? Yes; Fluid consistency: Thin  Diet effective now       Question Answer Comment  Room service appropriate? Yes   Fluid consistency: Thin      12/24/21 1429            HPI: Per admitting MD, [name] is a 71 y.o. 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  History supplemented by pt's son via phone, states that pt's brother also found him confused and on the ground the day before.  He is supposed to be taking daily medication for cirrhosis, but unsure what it is.  Pt lives independently with health aide, but concerns that he recently has not been able to care for himself.   He has history of variceal changes on [place], denies blood in stool or dark stools. On arrival to the ED pt was hypotensive and altered, labs significant for Hgb of 4.0, lactic acid >9, WBC 8k, Ast 495, ALT 107 and bilirubin 8.   He was given IVF, transfusion ordered and started on Levophed peripherally.  PCCM consulted for admission.  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   AKI (acute kidney injury) ([place])   Acute metabolic encephalopathy   SVT (supraventricular tachycardia) (HCC)   Goals of care, counseling/discussion   Thrombocytopenia ([facility])   Hyperkalemia, hyponatremia and hypomagnesemia   Left leg weakness and numbness   Splenic infarction   Physical deconditioning   Scrotal edema   Anxiety   Hemolytic anemia (HCC)   Chronic pain syndrome   Elevated liver enzymes   Hyperbilirubinemia   Traumatic rhabdomyolysis (HCC)   Hyperkalemia   Hyponatremia   Calculus of bile duct with acute cholecystitis   Elevated troponin   GI bleed   Cholecystitis   Drug rash   Volume overload   Somnolence   Pressure injury of skin  Principal problem Septic shock-initially admitted to the ICU requiring vasopressors.  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  General surgery consulted, he is now status post percutaneous cholecystostomy tube by IR on 5/27.  Biliary cultures showed pansensitive Enterococcus faecalis and faecium, as well as lactobacillus species.  He has completed a course of antibiotics for this, as well as fluconazole for Candida.  ID consulted.  He had another fever a few days ago, blood cultures were obtained and they have been without growth.  He is now afebrile   Active problems Left thigh pain, rhabdomyolysis, myonecrosis-imaging shows likely myonecrosis from traumatic  rhabdomyolysis versus nec fasciitis.  Orthopedic surgery consulted, did not feel like he is a surgical candidate.  Peripheral vascular disease-vascular surgery consulted due to severe left lower extremity arterial disease based on ABI.  He is not a surgical candidate unfortunately.  Continue local wound care  SVT-1 episode on 5/26, no further episodes  Acute metabolic encephalopathy-also with a degree  of hepatic encephalopathy, has been started on Seroquel and overall improved.  Minimize sedating medications Acute kidney injury-due to septic shock, creatinine has now normalized. Decompensated liver failure in patient with hep C cirrhosis-Per son, never been much of a drinker.  LFTs are mildly elevated but overall stable.  Rhabdomyolysis also contributing to LFT elevation Fluid overload, anasarca-has significant fluid overload in his left leg as well as upper arms.  Lasix was started, continue.  Most recent 2D echo showed normal EF 23-55%, grade 1 diastolic dysfunction.  There is a component of hypoalbuminemia in the setting of his liver disease as well.  Continue furosemide on discharge  Acute blood loss anemia-due to hemolytic anemia and GI bleed.  He did have an episode of frank red blood per rectum on 5/26, but no further bleeding.  GI consulted, underwent an EGD on 5/22 which showed gastritis and duodenitis.  Colonoscopy on 5/22 showed polyps, diverticulosis and internal hemorrhoids.  In total, he has been transfused 9 units of packed red blood cells, 1 FFP and 1 platelet transfusion.  GI signed off.  Hemoglobin has remained stable  Splenic infarction-Wedge-shaped area of hypoenhancement involving the anterior spleen concerning for splenic infarction noted on CT abdomen and pelvis. Repeat CTA shows stable lesion.  Outpatient referral to [facility] Left leg weakness, numbness-likely from rhabdomyolysis/myonecrosis of the left thigh.  Orthopedic surgery and vascular surgery consulted, not a candidate for further interventions. Dr. Kathaleen Bury feels that the patient has essentially no function of that LLE for past 2 years.  It was felt like if his symptoms progressed to forward infection and he require surgery it would be likely be a radical serial debridement of the left thigh or left hip disarticulation which will either be extremely morbid associated with very high perioperative mortality. Vascular surgery  evaluated and they feel that his vascular disease is likely the result of a very high-grade right common femoral artery stenosis but because of his multiple comorbidities including cirrhosis and thrombocytopenia is not a current operative candidate and they would not recommend stenting of common femoral artery at this time Hyperkalemia, hyponatremia, hypomagnesemia-overall stabilized Thrombocytopenia-due to liver disease.  No overt bleeding Goals of care-palliative following, remains full code full scope of care Abnormal MRI of the liver-right upper quadrant ultrasound was concerning for 6 cm liver mass, not appreciated on CT abdomen Chronic pain-appreciate palliative care consultation.  Currently on fentanyl patch and oxycodone.  It is important that his chronic pain continues to be treated as it was prescribed  Skin rash-unclear trigger, Unasyn has been discontinued.  Improving with steroids.  Continue quick taper  Elevated troponin-likely demand ischemia, no ACS type symptoms Wound care -see wound care consult note below  Reason for Consult: multiple wounds Wound type: dry gangrene to left 5th toe DTPI to LLE, lateral--it is maroon. DTPI to left lateral heel--it is dark pink to maroon, non-blanchable Unstageable PI to sacral/coccyx area with sloughing tissue. Unstageable PI to left lateral 5th metatarsal head. Pressure Injury POA: No Measurement: LLE, lateral DTPI measures 7.5 cm x 4 cm and it has intact overlying tissue.  This just needs monitoring and off  loading of pressure to the area. The left 5th toe is black, dry eschar. It just needs to be kept dry and stable. I have ordered application of iodine each shift to accomplish this. The left lateral 5th metatarsal head has an unstageable wound that is yellow and brown and measures 2 cm x 4 cm x unknown depth. I have ordered daily Medihoney and a foam dressing. The left lateral heel has a DTPI that just needs monitoring and off loading of  pressure. The sacrum has an unstagable PI that measures 8.5 cm x 9 cm x unknown depth. I have ordered Medihoney daily for this. Wound bed: as above Drainage (amount, consistency, odor) none Periwound: intact Dressing procedure/placement/frequency: As above.  Discharge Instructions   Allergies as of 01/04/2022       Reactions   Clindamycin Dermatitis   Unasyn [ampicillin-sulbactam Sodium] Rash   Diffuse whole body rash 12/28/21        Medication List     STOP taking these medications    hydrochlorothiazide 12.5 MG tablet Commonly known as: HYDRODIURIL   meloxicam 15 MG tablet Commonly known as: MOBIC   morphine 15 MG 12 hr tablet Commonly known as: MS CONTIN       TAKE these medications    acetaminophen 325 MG tablet Commonly known as: TYLENOL Take 2 tablets (650 mg total) by mouth every 6 (six) hours as needed for fever or mild pain.   ALPRAZolam 1 MG tablet Commonly known as: XANAX Take 1 tablet (1 mg total) by mouth 3 (three) times daily as needed for anxiety. What changed:  when to take this reasons to take this   fentaNYL 25 MCG/HR Commonly known as: Shelly 1 patch onto the skin every 3 (three) days.   folic acid 1 MG tablet Commonly known as: FOLVITE TAKE 1 TABLET(1 MG) BY MOUTH DAILY What changed: See the new instructions.   furosemide 80 MG tablet Commonly known as: Lasix Take 1 tablet (80 mg total) by mouth daily.   lactulose 10 GM/15ML solution Commonly known as: CHRONULAC Take 30 mLs (20 g total) by mouth daily.   levothyroxine 50 MCG tablet Commonly known as: SYNTHROID TAKE 1 TABLET(50 MCG) BY MOUTH DAILY BEFORE BREAKFAST What changed: See the new instructions.   losartan 100 MG tablet Commonly known as: COZAAR TAKE 1 TABLET(100 MG) BY MOUTH DAILY What changed: See the new instructions.   lubiprostone 24 MCG capsule Commonly known as: AMITIZA Take 24 mcg by mouth daily as needed.   multivitamin with minerals Tabs  tablet Take 1 tablet by mouth daily. Start taking on: January 05, 2022   Narcan 4 MG/0.1ML Liqd nasal spray kit Generic drug: naloxone Place 1 spray into the nose once.   Oxycodone HCl 10 MG Tabs Take 2.5 tablets (25 mg total) by mouth every 4 (four) hours as needed (moderate pain). What changed:  medication strength how much to take when to take this reasons to take this   pantoprazole 40 MG tablet Commonly known as: Protonix Take 1 tablet (40 mg total) by mouth daily.   predniSONE 10 MG tablet Commonly known as: DELTASONE Take 2 tablets (20 mg total) by mouth daily for 2 days, THEN 1 tablet (10 mg total) daily for 2 days. Start taking on: January 04, 2022   QUEtiapine 25 MG tablet Commonly known as: SEROQUEL Take 1 tablet (25 mg total) by mouth at bedtime.   vitamin B-12 1000 MCG tablet Commonly known as: CYANOCOBALAMIN Take 1 tablet (  1,000 mcg total) by mouth daily.   Vitamin D3 10 MCG (400 UNIT) Caps Take 4 capsules by mouth daily.         Procedures/Studies:  DG CHEST PORT 1 VIEW  Result Date: 01/03/2022 CLINICAL DATA:  Shortness of breath EXAM: PORTABLE CHEST 1 VIEW COMPARISON:  January 02, 2022 FINDINGS: The heart size and mediastinal contours are stable. The heart size is enlarged. There is probable mild patchy consolidation of the left lung base. There is no pulmonary edema or pleural effusion. The visualized skeletal structures are stable. IMPRESSION: Probable mild patchy consolidation of the left lung base, developing pneumonia is not excluded. Electronically Signed   By: Abelardo Diesel M.D.   On: 01/03/2022 07:36   DG CHEST PORT 1 VIEW  Result Date: 01/02/2022 CLINICAL DATA:  Shortness of breath. EXAM: PORTABLE CHEST 1 VIEW COMPARISON:  01/01/2022 FINDINGS: Stable mild cardiac enlargement prominent mediastinal contours. No definite infiltrates or effusions. IMPRESSION: Stable cardiac enlargement. No acute pulmonary findings. Electronically Signed   By: Marijo Sanes M.D.    On: 01/02/2022 08:47   DG CHEST PORT 1 VIEW  Result Date: 01/01/2022 CLINICAL DATA:  Septic shock EXAM: PORTABLE CHEST 1 VIEW COMPARISON:  December 31, 2021 FINDINGS: The heart size and mediastinal contours are stable. The heart size is enlarged. Both lungs are clear. The visualized skeletal structures are stable. IMPRESSION: No active disease. Electronically Signed   By: Abelardo Diesel M.D.   On: 01/01/2022 07:19   DG CHEST PORT 1 VIEW  Result Date: 12/31/2021 CLINICAL DATA:  Sepsis and failure to thrive. EXAM: PORTABLE CHEST 1 VIEW COMPARISON:  Portable chest 12/23/2021. FINDINGS: 6:02 a.m., 12/31/2021. The heart is enlarged. No vascular congestion is seen. There is stable aortic tortuosity with calcification in the transverse segment. No pleural effusion is evident. There is patchy hazy increased retrocardiac left lower lobe opacity today which could be due to atelectasis, pneumonia or aspiration. There is increased platelike linear atelectasis in the lateral left base. The remainder of the lungs clear.  Thoracic cage is intact. IMPRESSION: Increased retrocardiac left lower lobe opacity, could represent atelectasis, pneumonia or aspiration. No other focal lung infiltrate. Electronically Signed   By: Telford Nab M.D.   On: 12/31/2021 07:35   [place] ASCITES (ABDOMEN LIMITED)  Result Date: 12/30/2021 CLINICAL DATA:  Ascites check. EXAM: LIMITED ABDOMEN ULTRASOUND FOR ASCITES TECHNIQUE: Limited ultrasound survey for ascites was performed in all four abdominal quadrants. COMPARISON:  Dec 18, 2021 FINDINGS: No free fluid is seen within any of the 4 abdominal quadrants. IMPRESSION: No evidence of ascites. Electronically Signed   By: Virgina Norfolk M.D.   On: 12/30/2021 19:36   CT ANGIO AO+BIFEM W & OR WO CONTRAST  Addendum Date: 12/27/2021   ADDENDUM REPORT: 12/27/2021 23:09 ADDENDUM: These results were called by telephone at the time of interpretation on 12/27/2021 at 11:07 pm to provider Harold Barban , who  verbally acknowledged these results. Electronically Signed   By: Ronney Asters M.D.   On: 12/27/2021 23:09   Result Date: 12/27/2021 CLINICAL DATA:  Lower extremity vascular trauma. EXAM: CT ANGIOGRAPHY OF ABDOMINAL AORTA WITH ILIOFEMORAL RUNOFF TECHNIQUE: Multidetector CT imaging of the abdomen, pelvis and left lower extremity was performed using the standard protocol during bolus administration of intravenous contrast. Multiplanar CT image reconstructions and MIPs were obtained to evaluate the vascular anatomy. RADIATION DOSE REDUCTION: This exam was performed according to the departmental dose-optimization program which includes automated exposure control, adjustment of the mA and/or kV  according to patient size and/or use of iterative reconstruction technique. CONTRAST:  112m OMNIPAQUE IOHEXOL 350 MG/ML SOLN COMPARISON:  Left femur x-ray 10/23/2021. CT abdomen and pelvis 12/22/2021. FINDINGS: VASCULAR Aorta: Normal caliber aorta without aneurysm, dissection, vasculitis or significant stenosis. There is severe atherosclerotic calcifications of the aorta. Celiac: There is moderate focal stenosis at the origin of the celiac artery secondary to calcified atherosclerotic disease. Vessels are otherwise patent. SMA: There is severe stenosis in the proximal superior mesenteric artery secondary to calcified atherosclerotic disease measuring 15 mm in length. Vessels otherwise patent. Renals: Left renal artery appears patent. There is a questionable stent at its origin. There is severe stenosis at the origin of the right renal artery secondary to calcified atherosclerotic disease. The vessels are grossly patent distally. IMA: Patent without evidence of aneurysm, dissection, vasculitis or significant stenosis. LEFT Lower Extremity Inflow: There is calcified atherosclerotic disease in the left common iliac artery. Left common iliac artery and left external iliac artery appear patent without focal stenosis or dissection.  Outflow: There is severe focal stenosis in the left common iliac artery proximally secondary to calcified atherosclerotic disease. Flow is seen distally within the left common femoral artery. Flow is also identified within the superficial and profunda femoral arteries, although some calcified plaque is present. There is some mild areas of stenosis. No severe areas of stenosis, dissection or thrombosis identified. Popliteal artery patent. Runoff: Patent three vessel runoff to the ankle. Veins: No obvious venous abnormality within the limitations of this arterial phase study. Review of the MIP images confirms the above findings. NON-VASCULAR Lower chest: There is bibasilar atelectasis. Hepatobiliary: There is a new percutaneous cholecystostomy tube in place. Gallstones are again seen. Air in the gallbladder is again seen. Gallbladder wall thickening and mild surrounding inflammation persists. No biliary ductal dilatation. No new focal liver lesions. Pancreas: Unremarkable. No pancreatic ductal dilatation or surrounding inflammatory changes. Spleen: Spleen is mildly enlarged, unchanged. Wedge-shaped area of hypoenhancement in the spleen is unchanged compatible with splenic infarct. Adrenals/Urinary Tract: Adrenal glands are unremarkable. Kidneys are normal, without renal calculi, focal lesion, or hydronephrosis. Bladder is unremarkable. Stomach/Bowel: Stomach is within normal limits. Appendix appears normal. No evidence of bowel wall thickening, distention, or inflammatory changes. There is sigmoid colon diverticulosis without evidence for acute diverticulitis. Air-fluid levels are seen scattered throughout the colon. There is moderate gaseous distention of the stomach. Lymphatic: No enlarged lymph nodes are identified in the abdomen or pelvis. Reproductive: Prostate gland within normal limits. Other: No ascites or abdominal wall hernia. There is diffuse body wall edema. Musculoskeletal: There is no acute fracture or  dislocation identified. No cortical erosions are seen. Degenerative changes affect the left knee. Chronic compression deformity of L2 is unchanged. There is intramuscular edema, swelling, fascial edema and intramuscular air in the medial compartment of the left thigh. No discrete fluid collections are identified. There is diffuse subcutaneous edema of the entire lower extremity on the left. There is also subcutaneous edema of the visualized upper portion of the right lower extremity. There is marked scrotal wall edema as well. No areas of arterial enhancement are seen within the medial left thigh musculature. IMPRESSION: VASCULAR 1. Severe calcified atherosclerotic disease causes significant focal stenosis in the left common iliac artery, superior mesenteric artery and right renal artery. 2. No evidence for thrombosis or dissection. 3.  Aortic Atherosclerosis (ICD10-I70.0). NON-VASCULAR 1. Intramuscular and deep fat plane edema and intramuscular air in the medial compartment of the left thigh. Findings are concerning for infection/fasciitis.  Necrotizing fasciitis not excluded. No evidence for abscess. No active arterial bleeding seen in this region. 2. Diffuse body wall edema and, subcutaneous edema of the left lower extremity and scrotal wall edema. 3. Scattered air-fluid levels throughout the colon can be seen in the setting of diarrhea. No bowel obstruction. 4. New percutaneous cholecystostomy tube in place. Cholelithiasis with gallbladder wall thickening and inflammation appears similar to prior examination worrisome for cholecystitis. 5. Stable splenic infarct. Electronically Signed: By: Ronney Asters M.D. On: 12/27/2021 22:40   IR Perc Cholecystostomy  Result Date: 12/23/2021 INDICATION: 71 year old gentleman with acute cholecystitis and sepsis presents to IR for cholecystostomy drain placement EXAM: Ultrasound and fluoroscopy guided cholecystostomy drain placement MEDICATIONS: Patient on inpatient course  of antibiotics. ANESTHESIA/SEDATION: Moderate (conscious) sedation was employed during this procedure. A total of Versed 2 mg and Fentanyl 100 mcg was administered intravenously. Moderate Sedation Time: 12 minutes. The patient's level of consciousness and vital signs were monitored continuously by radiology nursing throughout the procedure under my direct supervision. FLUOROSCOPY TIME:  Radiation Exposure Index (as provided by the fluoroscopic device): 22 mGy Kerma COMPLICATIONS: None immediate. PROCEDURE: Informed written consent was obtained from the patient after a thorough discussion of the procedural risks, benefits and alternatives. All questions were addressed. Maximal Sterile Barrier Technique was utilized including caps, mask, sterile gowns, sterile gloves, sterile drape, hand hygiene and skin antiseptic. A timeout was performed prior to the initiation of the procedure. Patient positioned supine on the procedure table. Right upper quadrant skin prepped and draped usual fashion. Following local lidocaine administration, the gallbladder was accessed at the level of the fundus with a 21 gauge needle utilizing continuous ultrasound guidance. 21 gauge needle exchanged for a transitional dilator set over 0.018 inch guidewire. Transitional dilator set removed over 0.035 inch guidewire. Tract dilation performed and a 10.2 [place] multipurpose pigtail drain was placed. 40 mL appearing material was aspirated. Samples were sent for Gram stain and culture. Contrast administered through the drain under fluoroscopy confirmed appropriate positioning of the pigtail within the gallbladder lumen. Multiple filling defects were consistent with gallstones. Drain secured to skin with suture and connected to bag. IMPRESSION: 10.2 [place] multipurpose pigtail drain placed in gallbladder. Electronically Signed   By: Miachel Roux M.D.   On: 12/23/2021 13:53   DG FEMUR PORT MIN 2 VIEWS LEFT  Result Date: 12/23/2021 CLINICAL DATA:   Leg pain.  Fall. EXAM: LEFT FEMUR PORTABLE 2 VIEWS COMPARISON:  CT abdomen/pelvis Dec 22, 2021. FINDINGS: No evidence of acute fracture. Vascular calcifications. Gas and swelling in the left thigh better characterized on recent CT abdomen/pelvis. IMPRESSION: 1. No evidence of acute fracture. 2. Gas and swelling in the left thigh better characterized on recent CT abdomen/pelvis. Electronically Signed   By: Margaretha Sheffield M.D.   On: 12/23/2021 13:40   DG CHEST PORT 1 VIEW  Result Date: 12/23/2021 CLINICAL DATA:  Aspiration precautions. EXAM: PORTABLE CHEST 1 VIEW COMPARISON:  Chest radiograph Dec 18, 2021. FINDINGS: Low lung volumes. No consolidation. No visible pleural effusions or pneumothorax. Similar cardiomediastinal silhouette. IMPRESSION: Low lung volumes without evidence of acute cardiopulmonary disease. Electronically Signed   By: Margaretha Sheffield M.D.   On: 12/23/2021 09:56   CT ABDOMEN PELVIS W CONTRAST  Result Date: 12/22/2021 CLINICAL DATA:  Septic shock.  Liver lesion. EXAM: CT ABDOMEN AND PELVIS WITH CONTRAST TECHNIQUE: Multidetector CT imaging of the abdomen and pelvis was performed using the standard protocol following bolus administration of intravenous contrast. RADIATION DOSE REDUCTION: This exam  was performed according to the departmental dose-optimization program which includes automated exposure control, adjustment of the mA and/or kV according to patient size and/or use of iterative reconstruction technique. CONTRAST:  17m OMNIPAQUE IOHEXOL 300 MG/ML  SOLN COMPARISON:  CT AP from 12/20/2021. FINDINGS: Lower chest: Mild dependent changes identified within the lung bases. Hepatobiliary: Suspected cirrhosis. Hypertrophy of the lateral segment of left hepatic lobe and caudate lobe of liver noted. No focal liver lesion is identified. However, multiphase CT or MRI is the study of choice for evaluating hepatocellular carcinoma. Normal portal venous phase CT does not exclude the  possibility of underlying HCC. Multiple gallstones are again noted within the gallbladder. Gas is now seen within the lumen of the gallbladder. There is gallbladder wall thickening with surrounding inflammatory fat stranding. Secondary inflammation with wall thickening of the hepatic flexure is noted. There is enteric contrast material within the inflamed segment of the colon. No signs of patent contrast opacified fistula with the gallbladder. No signs of bile duct dilatation. Pancreas: Unremarkable. No pancreatic ductal dilatation or surrounding inflammatory changes. Spleen: There is a wedge-shaped area of hypoenhancement involving the anterior spleen compatible with splenic infarct, image 33/2. Adrenals/Urinary Tract: Normal adrenal glands. No kidney mass or hydronephrosis identified bilaterally. Urinary bladder is unremarkable. Stomach/Bowel: Stomach appears normal. Increase caliber of the duodenum within the left hemiabdomen is noted favoring focal ileus. Focal wall thickening involving the hepatic flexure is identified where it abuts the inflamed gallbladder. Vascular/Lymphatic: Aortic atherosclerosis without aneurysm. No abdominopelvic adenopathy. Reproductive: Prostate is unremarkable. Other: Small volume of free fluid noted within the pelvis. No discrete fluid collection to suggest abscess. Musculoskeletal: Limited imaging through the lower extremity show marked asymmetric swelling of the medial musculature of the left thigh with gas tracking throughout the muscle and surrounding overlying fat stranding. Previous ORIF of the right femur. Unchanged superior endplate compression fracture involving the L2 vertebra. IMPRESSION: 1. Imaging findings compatible with acute cholecystitis. Gas is now identified within the lumen of the gallbladder. Diagnosis of exclusion would be acute emphysematous cholecystitis. Alternatively, presence of gas within the gallbladder lumen may reflect cholecystocolonic fistula although  there is no patent contrast opacifying fistula identified on the current exam (with sufficient contrast material noted within the adjacent inflamed loop of colon). 2. Acute splenic infarct. 3. Marked asymmetric swelling of the medial musculature of the left thigh with gas tracking throughout the muscle with surrounding fat stranding. Cannot exclude necrotizing fasciitis. 4. Increase caliber of the duodenum within the left hemiabdomen favoring focal ileus. 5. Cirrhosis. No mass identified within the liver to suggest underlying malignancy. Certainly there is no lesion within the liver which should impact the patient's current acute clinical situation. Ideally, in a patient that is at increased risk for hepatocellular carcinoma the [facility], multiphase MRI. This should be performed only when the patient is able to remain motionless and participate in breath hold technique. Alternatively, outpatient multiphase liver CT would be recommended. 6. Aortic Atherosclerosis (ICD10-I70.0). Critical Value/emergent results were called by telephone at the time of interpretation on 12/22/2021 at 7:36 pm to provider ERufina Falco NP , who verbally acknowledged these results. Electronically Signed   By: TKerby MoorsM.D.   On: 12/22/2021 19:37   VAS [place] WITH/WO TBI  Result Date: 12/21/2021  LOWER EXTREMITY DOPPLER STUDY Patient Name:  JDAMIEN BATTY  Date of Exam:   12/21/2021 Medical Rec #: [id number]  Accession #:    [id number] Date of Birth: 07-06-1951             Patient Gender: M Patient Age:   3 years Exam Location:  [facility] Procedure:      VAS [place] ABI WITH/WO TBI Referring Phys: TAYE GONFA --------------------------------------------------------------------------------  Indications: Peripheral artery disease, and weakness/numbness. High Risk Factors: Hypertension, hyperlipidemia, no history of smoking.  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 Comparison Study: No previous exams Performing Technologist: Hill, Jody RVT, RDMS  Examination Guidelines: A complete evaluation includes at minimum, Doppler waveform signals and systolic blood pressure reading at the level of bilateral brachial, anterior tibial, and posterior tibial arteries, when vessel segments are accessible. Bilateral testing is considered an integral part of a complete examination. Photoelectric Plethysmograph (PPG) waveforms and toe systolic pressure readings are included as required and additional duplex testing as needed. Limited examinations for reoccurring indications may be performed as noted.  ABI Findings: +--------+------------------+-----+---------+--------+ Right   Rt Pressure (mmHg)IndexWaveform Comment  +--------+------------------+-----+---------+--------+ [id number]                    triphasic         +--------+------------------+-----+---------+--------+ +---------+------------------+-----+-------------------+-------+ Left     Lt Pressure (mmHg)IndexWaveform           Comment +---------+------------------+-----+-------------------+-------+ Brachial 138                    triphasic                  +---------+------------------+-----+-------------------+-------+ PTA      43                0.29 dampened monophasic        +---------+------------------+-----+-------------------+-------+ DP       47                0.32 dampened monophasic        +---------+------------------+-----+-------------------+-------+ Great Toe                       Absent                     +---------+------------------+-----+-------------------+-------+ +-------+-----------+-----------+------------+------------+ ABI/TBIToday's ABIToday's TBIPrevious ABIPrevious TBI +-------+-----------+-----------+------------+------------+ Left   0.32       absent                               +-------+-----------+-----------+------------+------------+  RT AKA.  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 The left toe-brachial index is absent. *See table(s) above for measurements and observations.  Vascular consult recommended. Electronically signed by Jamelle Haring on 12/21/2021 at 4:50:57 PM.    Final    CT ABDOMEN PELVIS WO CONTRAST  Result Date: 12/20/2021 CLINICAL DATA:  Acute abdominal pain EXAM: CT ABDOMEN AND PELVIS WITHOUT CONTRAST TECHNIQUE: Multidetector CT imaging of the abdomen and pelvis was performed following the standard protocol without IV contrast. RADIATION DOSE REDUCTION: This exam was performed according to the departmental dose-optimization program which includes automated exposure control, adjustment of the mA and/or kV according to patient size and/or use of iterative reconstruction technique. COMPARISON:  MRI abdomen dated 12/20/2021. Right upper quadrant ultrasound dated 12/18/2021. FINDINGS: Lower chest: Mild left basilar atelectasis. Hepatobiliary: Macronodular hepatic contour raises the possibility of cirrhosis. No focal hepatic lesion on unenhanced CT. Cholelithiasis with gallbladder distension and pericholecystic inflammatory changes, reflecting acute cholecystitis.  Secondary inflammatory changes along the serosal aspect of the colon at the hepatic flexure (series 2/image 36). Localized perforation with fistulous communication is suggested on MR but is poorly evaluated on unenhanced CT (series 2/image 35). No intrahepatic or extrahepatic ductal dilatation. Pancreas: Within normal limits. Spleen: Mildly enlarged, measuring 16.4 cm in maximal craniocaudal dimension. Adrenals/Urinary Tract: Adrenal glands are within normal limits. Kidneys are within normal limits. No renal, ureteral, or bladder calculi. No hydronephrosis. Bladder is mildly thick-walled although underdistended. Stomach/Bowel: Stomach is within  normal limits. No evidence of bowel obstruction. Normal appendix (series 2/image 61). Serosal wall thickening/inflammatory changes along the hepatic flexure, as described above. Mild left colonic diverticulosis, without evidence of diverticulitis. Vascular/Lymphatic: No evidence of abdominal aortic aneurysm. Mild perigastric varices. Atherosclerotic calcifications of the abdominal aorta and branch vessels. No suspicious abdominopelvic lymphadenopathy. Reproductive: Prostate is unremarkable. Other: No abdominopelvic ascites. No free air. Musculoskeletal: Degenerative changes of the visualized thoracolumbar spine. Mild superior endplate changes at L2, likely chronic. IMPRESSION: Cholelithiasis with acute cholecystitis. Suspected localized perforation with secondary inflammatory changes involving the hepatic flexure of the colon, better evaluated on recent MRI. Surgical consultation is suggested. Secondary inflammatory changes along the serosal aspect of the colon at the hepatic flexure. Localized perforation with fistulous communication is suggested on MR but is poorly evaluated on unenhanced CT. Suspected cirrhosis. No focal hepatic lesion on unenhanced CT. Mild splenomegaly and perigastric varices. These results will be called to the ordering clinician or representative by the Radiologist Assistant, and communication documented in the PACS or [employer]. Electronically Signed   By: Julian Hy M.D.   On: 12/20/2021 17:16   MR LIVER WO CONRTAST  Result Date: 12/20/2021 CLINICAL DATA:  Evaluate for hepatocellular carcinoma. EXAM: MRI ABDOMEN WITHOUT CONTRAST TECHNIQUE: Multiplanar multisequence MR imaging was performed without the administration of intravenous contrast. COMPARISON:  Abdominal sonogram 12/18/2021 FINDINGS: Lower chest: Atelectasis versus airspace disease noted within the left base. Hepatobiliary: Diffuse low signal throughout the liver on the in-phase sequence which increases in signal  intensity on the out of phase sequences suggest iron deposition within the liver. There is hypertrophy of the lateral segment of left hepatic lobe as well as the caudate lobe. Contour the liver has a macro nodular appearance compatible with cirrhosis. Evaluation for underlying mass lesion is significantly limited due to lack of IV contrast material and respiratory motion artifact. Furthermore, patient was unable to breath hold and requested early termination of the exam. Within this limitation no large liver mass identified. Multiple stones are identified within the gallbladder. These all measure around 4 mm. The gallbladder wall appears diffusely edematous with surrounding inflammatory fat stranding. There is a focal fluid collection which extends beyond the normal margins of the anterior wall of the gallbladder abutting the hepatic flexure, image 25/4 and image 14/6. As mentioned above examination is significantly limited reflecting motion artifact and lack of IV contrast material. No signs of common bile duct or intrahepatic bile duct dilatation. Pancreas:  No acute abnormality. Spleen:  The spleen is enlarged measuring 16.9 cm cranial caudal. Adrenals/Urinary Tract: Normal appearance of the adrenal glands. No kidney mass or signs of hydronephrosis. Stomach/Bowel: No abnormal bowel dilatation identified. Vascular/Lymphatic: No pathologically enlarged lymph nodes identified. No abdominal aortic aneurysm demonstrated. Other: There is free fluid identified within the upper abdomen extending over the liver dome. Musculoskeletal: Asymmetric increased signal intensity is identified localizing to the visualized portions of the left gluteus musculature, image 46/4. IMPRESSION: 1. Examination is limited due to lack of  IV contrast material and respiratory motion artifact. Within this limitation no large liver mass identified. However study is not diagnostic for excluding 8 cc in a patient who is at increased risk. 2.  Morphologic features of the liver compatible with cirrhosis with stigmata of portal venous hypertension including splenomegaly and ascites. 3. Gallstones with diffuse gallbladder wall thickening and surrounding inflammatory fat stranding. A small volume of free fluid is identified extending over the dome of liver. There is a focal fluid collection which extends beyond the normal margins of the anterior wall of the gallbladder abutting the hepatic flexure. This may represent a small abscess or focal perforation of the gallbladder. Cholecystocolonic fistula is also a potential consideration. Further evaluation with CT of the abdomen pelvis is recommended. 4. Asymmetric increased signal intensity identified localizing to the visualized portions of the left gluteus musculature. This is only partially visualized and is of uncertain significance. Attention on follow-up CT is advised. 5. Critical Value/emergent results were called by telephone at the time of interpretation on 12/20/2021 at 11:45 am to provider [facility] , who verbally acknowledged these results. Electronically Signed   By: Kerby Moors M.D.   On: 12/20/2021 11:46   ECHOCARDIOGRAM COMPLETE  Result Date: 12/19/2021    ECHOCARDIOGRAM REPORT   Patient Name:   Darrick Greenlaw Rozeboom [name]. Date of Exam: 12/19/2021 Medical Rec #:  [id number]            Height:       72.0 in Accession #:    [id number]           Weight:       196.2 lb Date of Birth:  03/07/1951            BSA:          2.113 m Patient Age:    71 years             BP:           137/76 mmHg Patient Gender: M                    HR:           89 bpm. Exam Location:  Inpatient Procedure: 2D Echo, Cardiac Doppler and Color Doppler Indications:    Elevated troponin  History:        Patient has prior history of Echocardiogram examinations, most                 recent 04/09/2018. Risk Factors:Hypertension.  Sonographer:    Jefferey Pica Referring Phys: [id number] [place]  1. Left ventricular  ejection fraction, by estimation, is 55 to 60%. The left ventricle has normal function. The left ventricle demonstrates regional wall motion abnormalities (basal septal hypokinesis). There is moderate concentric left ventricular hypertrophy. Left ventricular diastolic parameters are consistent with Grade I diastolic dysfunction (impaired relaxation). Qualitatively LV appears slightly underfilled.  2. Right ventricular systolic function is normal. The right ventricular size is normal. Tricuspid regurgitation signal is inadequate for assessing PA pressure.  3. The mitral valve is normal in structure. No evidence of mitral valve regurgitation. No evidence of mitral stenosis.  4. The aortic valve was not well visualized. There is mild calcification of the aortic valve. Aortic valve regurgitation is not visualized.  5. The inferior vena cava is normal in size with greater than 50% respiratory variability, suggesting right atrial pressure of 3 mmHg. Comparison(s): Prior images reviewed side by side. Wall motion abnormalities are  new from prior: prior study was not performed off-axis. LV size was normal in prior study. FINDINGS  Left Ventricle: Left ventricular ejection fraction, by estimation, is 55 to 60%. The left ventricle has normal function. The left ventricle demonstrates regional wall motion abnormalities. The left ventricular internal cavity size was normal in size. There is moderate concentric left ventricular hypertrophy. Left ventricular diastolic parameters are consistent with Grade I diastolic dysfunction (impaired relaxation).  LV Wall Scoring: The basal anteroseptal segment, mid inferoseptal segment, and basal inferoseptal segment are hypokinetic. Right Ventricle: The right ventricular size is normal. No increase in right ventricular wall thickness. Right ventricular systolic function is normal. Tricuspid regurgitation signal is inadequate for assessing PA pressure. Left Atrium: Left atrial size was normal  in size. Right Atrium: Right atrial size was normal in size. Pericardium: Trivial pericardial effusion is present. Presence of epicardial fat layer. Mitral Valve: The mitral valve is normal in structure. No evidence of mitral valve regurgitation. No evidence of mitral valve stenosis. Tricuspid Valve: The tricuspid valve is normal in structure. Tricuspid valve regurgitation is not demonstrated. No evidence of tricuspid stenosis. Aortic Valve: The aortic valve was not well visualized. There is mild calcification of the aortic valve. Aortic valve regurgitation is not visualized. Aortic valve peak gradient measures 8.5 mmHg. Pulmonic Valve: The pulmonic valve was not well visualized. Pulmonic valve regurgitation is not visualized. Aorta: The aortic root and ascending aorta are structurally normal, with no evidence of dilitation. Venous: The inferior vena cava is normal in size with greater than 50% respiratory variability, suggesting right atrial pressure of 3 mmHg. IAS/Shunts: No atrial level shunt detected by color flow Doppler.  LEFT VENTRICLE PLAX 2D LVIDd:         5.10 cm LVIDs:         3.30 cm LV PW:         1.50 cm LV IVS:        1.50 cm LVOT diam:     2.10 cm LV SV:         51 LV SV Index:   24 LVOT Area:     3.46 cm  IVC IVC diam: 1.80 cm LEFT ATRIUM           Index        RIGHT ATRIUM           Index LA diam:      3.30 cm 1.56 cm/m   RA Area:     16.60 cm LA Vol (A4C): 57.0 ml 26.97 ml/m  RA Volume:   48.90 ml  23.14 ml/m  AORTIC VALVE                 PULMONIC VALVE AV Area (Vmax): 2.28 cm     PV Vmax:       1.06 m/s AV Vmax:        146.00 cm/s  PV Peak grad:  4.5 mmHg AV Peak Grad:   8.5 mmHg LVOT Vmax:      96.00 cm/s LVOT Vmean:     55.600 cm/s LVOT VTI:       0.146 m  AORTA Ao Root diam: 3.70 cm Ao Asc diam:  3.40 cm MITRAL VALVE MV Area (PHT): 3.63 cm    SHUNTS MV Decel Time: 209 msec    Systemic VTI:  0.15 m MV E velocity: 44.60 cm/s  Systemic Diam: 2.10 cm MV A velocity: 56.60 cm/s MV E/A ratio:   0.79 Rudean Haskell MD Electronically signed by  Rudean Haskell MD Signature Date/Time: 12/19/2021/8:46:06 AM    Final    CT HEAD WO CONTRAST (5MM)  Result Date: 12/18/2021 CLINICAL DATA:  Altered mental status EXAM: CT HEAD WITHOUT CONTRAST TECHNIQUE: Contiguous axial images were obtained from the base of the skull through the vertex without intravenous contrast. RADIATION DOSE REDUCTION: This exam was performed according to the departmental dose-optimization program which includes automated exposure control, adjustment of the mA and/or kV according to patient size and/or use of iterative reconstruction technique. COMPARISON:  12/21/2019 FINDINGS: Brain: No evidence of acute infarction, hemorrhage, cerebral edema, mass, mass effect, or midline shift. No hydrocephalus or extra-axial fluid collection. Vascular: No hyperdense vessel. Skull: Normal. Negative for fracture or focal lesion. Sinuses/Orbits: No acute finding. Other: The mastoid air cells are well aerated. IMPRESSION: No acute intracranial process. Electronically Signed   By: Merilyn Baba M.D.   On: 12/18/2021 21:43   DG Abd 1 View  Result Date: 12/18/2021 CLINICAL DATA:  Enteric catheter placement EXAM: ABDOMEN - 1 VIEW COMPARISON:  None Available. FINDINGS: Frontal view of the lower chest and upper abdomen demonstrates a weighted tip of an enteric feeding catheter projecting over the gastric antrum. Bowel gas pattern is unremarkable. IMPRESSION: 1. Enteric catheter tip projecting over the gastric antrum. Electronically Signed   By: Randa Ngo M.D.   On: 12/18/2021 19:25   DG CHEST PORT 1 VIEW  Result Date: 12/18/2021 CLINICAL DATA:  Central line placement EXAM: PORTABLE CHEST 1 VIEW COMPARISON:  12/18/2021 FINDINGS: Right-sided central venous catheter tip over the SVC. No pneumothorax. No focal opacity, or pleural effusion. Stable borderline cardiomegaly. IMPRESSION: 1. Right IJ central venous catheter tip over the SVC. No  pneumothorax 2. Clear lung fields Electronically Signed   By: Donavan Foil M.D.   On: 12/18/2021 16:56   US Abdomen Limited RUQ (LIVER/GB)  Result Date: 12/18/2021 CLINICAL DATA:  A 71 year old male presents for evaluation of elevated liver function tests. EXAM: ULTRASOUND ABDOMEN LIMITED RIGHT UPPER QUADRANT COMPARISON:  Imaging from January 25, 2021. FINDINGS: Gallbladder: Gallbladder not well assessed due to limitations with patient positioning and sonographic window. Question of gallbladder wall thickening. The gallbladder is not imaged in its entirety Common bile duct: Diameter: 3.3 mm though not well assessed due to patient positioning. Liver: Heterogeneous hepatic echotexture with lobular hepatic contours. Question of liver mass seen potentially along the LEFT hepatic lobe though not well assessed. Not definitively associated with the liver on some images. Again with limited assessment as this is deeper in the abdomen, not along the superficial hepatic margin. This may measure up to 6.5 x 6.4 x 4.8 cm. Portal vein is patent on color Doppler imaging with normal direction of blood flow towards the liver. Other: Again markedly limited assessment due to difficulty in positioning the patient for the evaluation. IMPRESSION: Very limited evaluation. Gallbladder is not fully assessed with signs of mild thickening along visualized portions. Liver with suspected mass measuring greater than 6 cm. Suggest further evaluation with contrasted CT, if intravenous contrast cannot be administered noncontrast CT would likely be of benefit to exclude acute process and guide further workup/management. Electronically Signed   By: Zetta Bills M.D.   On: 12/18/2021 14:00   DG Chest Port 1 View  Result Date: 12/18/2021 CLINICAL DATA:  Questionable sepsis. EXAM: PORTABLE CHEST 1 VIEW COMPARISON:  Dec 21, 2019 FINDINGS: Likely projectional prominence of the cardiac silhouette and vascular pedicle. Aortic atherosclerosis. No  focal airspace consolidation. No visible pleural effusion or pneumothorax. No  acute abnormality. IMPRESSION: No focal airspace consolidation or visible pleural effusion. Electronically Signed   By: Dahlia Bailiff M.D.   On: 12/18/2021 12:06     Subjective: - no chest pain, shortness of breath, no abdominal pain, nausea or vomiting.   Discharge Exam: BP 138/60 (BP Location: Left Arm)   Pulse 88   Temp 97.7 F (36.5 C) (Oral)   Resp 18   Ht 6' (1.829 m)   Wt 101.9 kg   SpO2 98%   BMI 30.47 kg/m   General: Pt is alert, awake, not in acute distress Cardiovascular: RRR, S1/S2 +, no rubs, no gallops Respiratory: CTA bilaterally, no wheezing, no rhonchi Abdominal: Soft, NT, ND, bowel sounds + Extremities: no edema, no cyanosis   The results of significant diagnostics from this hospitalization (including imaging, microbiology, ancillary and laboratory) are listed below for reference.     Microbiology: Recent Results (from the past 240 hour(s))  Culture, blood (Routine X 2) w Reflex to ID Panel     Status: None   Collection Time: 12/29/21  8:50 AM   Specimen: Bronchial Wash; Blood  Result Value Ref Range Status   Specimen Description   Final    BRONCHIAL WASHINGS Performed at [facility] [address] [place], [place] [address]    Special Requests   Final    BOTTLES DRAWN AEROBIC AND ANAEROBIC Blood Culture adequate volume Performed at [facility] [address] [place], [place] [address]    Culture   Final    NO GROWTH 5 DAYS Performed at [facility], [place] [address] [place], [place] [address]    Report Status 01/03/2022 FINAL  Final  Culture, blood (Routine X 2) w Reflex to ID Panel     Status: None   Collection Time: 12/29/21  9:00 AM   Specimen: BLOOD LEFT HAND  Result Value Ref Range Status   Specimen Description   Final    BLOOD LEFT HAND Performed at [facility] [address],  [place], [place] [address]    Special Requests   Final    BOTTLES DRAWN AEROBIC AND ANAEROBIC Blood Culture adequate volume Performed at [facility] [address] [place], [place] [address]    Culture   Final    NO GROWTH 5 DAYS Performed at [facility], [place] [address], [place], [place] [address]    Report Status 01/03/2022 FINAL  Final     Labs: Basic Metabolic Panel: Recent Labs  Lab 12/31/21 0655 01/01/22 0542 01/02/22 0419 01/03/22 0346 01/04/22 0357  NA 132* 135 138 137 137  K 4.9 4.0 3.8 3.8 3.9  CL 97* 100 99 96* 94*  CO2 _0 32 31  GLUCOSE 187* 200* 186* 122* 145*  BUN 35* 38* 40* 35* 31*  CREATININE 0.57* 0.61 0.57* 0.52* 0.50*  CALCIUM 8.6* 8.7* 9.0 8.8* 8.6*  MG 1.8 1.7 2.0 1.6* 1.9  PHOS 5.1* 4.1 3.8 2.7 4.1   Liver Function Tests: Recent Labs  Lab 12/31/21 0655 01/01/22 0542 01/02/22 0419 01/03/22 0346 01/04/22 0357  AST 60* 71* 57* 100* 66*  ALT 41 46* 50* 64* 61*  ALKPHOS 77 106 101 113 103  BILITOT 1.0 1.0 1.0 1.7* 1.7*  PROT 5.0* 4.9* 5.4* 5.2* 5.2*  ALBUMIN 1.7* 1.8* 2.0* 1.9* 2.0*   CBC: Recent Labs  Lab 12/31/21 0655 01/01/22 0542 01/02/22 0419 01/03/22 0346 01/04/22 0357  WBC 2.5* 4.1 2.9* 3.5*  3.4*  NEUTROABS 2.0 3.6 2.4 2.4 2.4  HGB 6.0* 6.7* 7.5* 8.8* 8.9*  HCT 18.7* 20.6* 23.3* 25.6* 28.3*  MCV 98.9 96.7 97.1 95.5 99.3  PLT 26* 24* 23* 26* 24*   CBG: No results for input(s): "GLUCAP" in the last 168 hours. Hgb A1c No results for input(s): "HGBA1C" in the last 72 hours. Lipid Profile No results for input(s): "CHOL", "HDL", "LDLCALC", "TRIG", "CHOLHDL", "LDLDIRECT" in the last 72 hours. Thyroid function studies No results for input(s): "TSH", "T4TOTAL", "T3FREE", "THYROIDAB" in the last 72 hours.  Invalid input(s): "FREET3" Urinalysis    Component Value Date/Time   COLORURINE AMBER (A) 12/18/2021 1139   APPEARANCEUR CLEAR 12/18/2021 1139   LABSPEC 1.013 12/18/2021 1139   PHURINE 5.0  12/18/2021 1139   GLUCOSEU NEGATIVE 12/18/2021 1139   GLUCOSEU NEGATIVE 01/06/2015 0912   HGBUR LARGE (A) 12/18/2021 1139   BILIRUBINUR NEGATIVE 12/18/2021 1139   KETONESUR 5 (A) 12/18/2021 1139   PROTEINUR 100 (A) 12/18/2021 1139   UROBILINOGEN 1.0 01/06/2015 0912   NITRITE NEGATIVE 12/18/2021 1139   LEUKOCYTESUR NEGATIVE 12/18/2021 1139    FURTHER DISCHARGE INSTRUCTIONS:   Get Medicines reviewed and adjusted: Please take all your medications with you for your next visit with your Primary MD   Laboratory/radiological data: Please request your Primary MD to go over all hospital tests and procedure/radiological results at the follow up, please ask your Primary MD to get all Hospital records sent to his/her office.   In some cases, they will be blood work, cultures and biopsy results pending at the time of your discharge. Please request that your primary care M.D. goes through all the records of your hospital data and follows up on these results.   Also Note the following: If you experience worsening of your admission symptoms, develop shortness of breath, life threatening emergency, suicidal or homicidal thoughts you must seek medical attention immediately by calling 911 or calling your MD immediately  if symptoms less severe.   You must read complete instructions/literature along with all the possible adverse reactions/side effects for all the Medicines you take and that have been prescribed to you. Take any new Medicines after you have completely understood and accpet all the possible adverse reactions/side effects.    Do not drive when taking Pain medications or sleeping medications (Benzodaizepines)   Do not take more than prescribed Pain, Sleep and Anxiety Medications. It is not advisable to combine anxiety,sleep and pain medications without talking with your primary care practitioner   Special Instructions: If you have smoked or chewed Tobacco  in the last 2 yrs please stop  smoking, stop any regular Alcohol  and or any Recreational drug use.   Wear Seat belts while driving.   Please note: You were cared for by a hospitalist during your hospital stay. Once you are discharged, your primary care physician will handle any further medical issues. Please note that NO REFILLS for any discharge medications will be authorized once you are discharged, as it is imperative that you return to your primary care physician (or establish a relationship with a primary care physician if you do not have one) for your post hospital discharge needs so that they can reassess your need for medications and monitor your lab values.  Time coordinating discharge: 40 minutes  SIGNED:  Marzetta Board, MD, PhD 01/04/2022, 10:34 AM

## 2022-01-04 NOTE — Consult Note (Signed)
WOC Nurse Consult Note: Patient receiving care in [facility]. Assisted by primary RN Nira Conn, and another RN for turning. Reason for Consult: multiple wounds Wound type: dry gangrene to left 5th toe--it is black. DTPI to LLE, lateral--it is maroon. DTPI to left lateral heel--it is dark pink to maroon, non-blanchable Unstageable PI to sacral/coccyx area with sloughing tissue. Unstageable PI to left lateral 5th metatarsal head. Pressure Injury POA: No Measurement: LLE, lateral DTPI measures 7.5 cm x 4 cm and it has intact overlying tissue.  This just needs monitoring and off loading of pressure to the area. The left 5th toe is black, dry eschar. It just needs to be kept dry and stable. I have ordered application of iodine each shift to accomplish this. The left lateral 5th metatarsal head has an unstageable wound that is yellow and brown and measures 2 cm x 4 cm x unknown depth. I have ordered daily Medihoney and a foam dressing. The left lateral heel has a DTPI that just needs monitoring and off loading of pressure. The sacrum has an unstagable PI that measures 8.5 cm x 9 cm x unknown depth. I have ordered Medihoney daily for this. Wound bed: as above Drainage (amount, consistency, odor) none Periwound: intact Dressing procedure/placement/frequency: As above.  I have also requested a bed with a low air loss mattress.  The [place] has requested this.  Monitor the wound area(s) for worsening of condition such as: Signs/symptoms of infection,  Increase in size,  Development of or worsening of odor, Development of pain, or increased pain at the affected locations.  Notify the medical team if any of these develop.  Thank you for the consult.  Discussed plan of care with the patient and bedside nurse.  Val Riles, RN, MSN, CWOCN, CNS-BC, pager [phone]

## 2022-01-05 NOTE — Plan of Care (Signed)
  Problem: Clinical Measurements: Goal: Will remain free from infection Outcome: Progressing Goal: Diagnostic test results will improve Outcome: Progressing Goal: Respiratory complications will improve Outcome: Progressing Goal: Cardiovascular complication will be avoided Outcome: Progressing   Problem: Coping: Goal: Level of anxiety will decrease Outcome: Progressing   Problem: Elimination: Goal: Will not experience complications related to bowel motility Outcome: Progressing Goal: Will not experience complications related to urinary retention Outcome: Progressing   Problem: Pain Managment: Goal: General experience of comfort will improve Outcome: Progressing

## 2022-01-12 ENCOUNTER — Non-Acute Institutional Stay: Payer: Medicare Other | Admitting: Hospice

## 2022-01-12 DIAGNOSIS — Z515 Encounter for palliative care: Secondary | ICD-10-CM

## 2022-01-12 DIAGNOSIS — F419 Anxiety disorder, unspecified: Secondary | ICD-10-CM

## 2022-01-12 DIAGNOSIS — G894 Chronic pain syndrome: Secondary | ICD-10-CM

## 2022-01-12 DIAGNOSIS — R531 Weakness: Secondary | ICD-10-CM

## 2022-01-12 NOTE — Progress Notes (Signed)
[facility] Consult Note Telephone: [phone]  Fax: [phone]  PATIENT NAME: [name] [place] [id number] [phone] (home)  DOB: [birth year] MRN: [id number]  PRIMARY CARE PROVIDER:    [name], FNP,  [address] [place] [address] [phone]  REFERRING PROVIDER:   [name], FNP [address] [place],  [place] [address] [phone]  RESPONSIBLE PARTY:   [place]     Name Relation Home Work [place] Son [phone]  [phone]   Faruq, [place]   [phone]        I met face to face with patient at facility. Visit to build trust and highlight Palliative Medicine as specialized medical care for people living with serious illness, aimed at facilitating better quality of life through symptoms relief, assisting with advance care planning and complex medical decision making.  NP called [name]  and updated him on visit.  [name] provided additional history. ASSESSMENT AND / RECOMMENDATIONS:   Advance Care Planning: Our advance care planning conversation included a discussion about:    The value and importance of advance care planning  Difference between Hospice and Palliative care Exploration of goals of care in the event of a sudden injury or illness  Identification and preparation of a healthcare agent  Review and updating or creation of an  advance directive document . Decision not to resuscitate or to de-escalate disease focused treatments due to poor prognosis.  CODE STATUS: Patient affirmed patient is a partial code.  CPR and ACLS medications desired; no intubation/mechanical ventilation life support.  Goals of Care: Goals include to maximize quality of life and symptom management  I spent 16  minutes providing this initial consultation. More than 50% of the time in this consultation was spent on counseling  patient and coordinating communication. --------------------------------------------------------------------------------------------------------------------------------------  Symptom Management/Plan: Weakness: Acute on chronic worsened with recent hospitalization 5/22 - 01/05/2022 for septic shock related to acalculous cholecystitis status post percutaneous cholecystectomy tube by IR on 12/23/21.  Patient completed antibiotics, discharged to SNF for rehab.  Continue PT OT for strengthening, bed mobility/transfers.  Fall precautions.  Chronic pain syndrome: Left knee pain secondary to osteoarthritis.  Acute left thigh pain/weakness likely related to rhabdomyolysis/myonecrosis.  Continue fentanyl patch and oxycodone as ordered.  Patient is not a surgical candidate per vascular surgery consult while at the hospital.  Routine CBC CMP.  Anxiety: Continue Xanax.  Descalation techniques.  Followed by Psychiatry- Dr. Casimiro Needle. Follow up: Palliative care will continue to follow for complex medical decision making, advance care planning, and clarification of goals. Return 6 weeks or prn. Encouraged to call provider sooner with any concerns.   Family /Caregiver/Community Supports: Patient in SNF for acute rehab.  HOSPICE ELIGIBILITY/DIAGNOSIS: TBD  Chief Complaint: Initial Palliative care visit  HISTORY OF PRESENT ILLNESS:  [name]. is a 71 y.o. year old male  with multiple morbidities requiring close monitoring and with high risk of complications and  mortality: cirrhosis, hep C,  right AKA, PVD, chronic pain syndrome, acute pain of left thigh/rhabdomyolysis/myonecrosis, . History obtained from review of EMR, discussion with primary team, caregiver, family and/or [name].  Review and summarization of Epic records shows history from other than patient. Rest of 10 point ROS asked and negative. Independent interpretation of tests and reviewed as needed, available labs, patient records, imaging,  studies and related documents from the EMR.  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[place]) 02/26/2012   Cellulitis of right thigh 05/21/2012   Hypothyroidism 09/25/2016   Chronic pain syndrome 11/23/2016   Hepatic encephalopathy ([facility]) 04/07/2018   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[place]) 04/14/2015   HTN (hypertension) 09/14/2019   Osteoarthritis of left knee 02/26/2018   Eye trauma 02/29/2020   Hepatitis B core antibody positive 05/22/2016   History of pancytopenia 02/29/2020   Portal venous hypertension ([place]) 02/29/2020   S/P AKA (above knee amputation) unilateral ([place]) 02/29/2020   Hepatic cirrhosis due to chronic hepatitis C infection ([facility]) 02/29/2020   Goals of care, counseling/discussion 02/29/2020   Disorder of skeletal system 02/29/2020   Physical deconditioning 02/29/2020   Left leg swelling 12/14/2020   Hypomagnesemia 12/16/2020   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    Gastric nodule    Duodenal erythema    Polyp of colon    Septic shock ([facility] [place]) 12/18/2021   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 14/97/0263   Thrombocytopenia ([facility]) 12/20/2021   AKI (acute kidney injury) ([facility]) 78/58/8502   Acute metabolic encephalopathy 77/41/2878   Elevated liver enzymes 12/20/2021   Hyperbilirubinemia 12/20/2021   Traumatic rhabdomyolysis ([place]) 12/20/2021   Hyperkalemia, hyponatremia and hypomagnesemia 12/20/2021   Hyperkalemia 12/20/2021   Hyponatremia 12/20/2021    Left leg weakness and numbness 12/21/2021   Calculus of bile duct with acute cholecystitis 12/21/2021   Elevated troponin 12/21/2021   SVT (supraventricular tachycardia) ([facility] [place]) 12/22/2021   Acute pain of left thigh/rhabdomyolysis/myonecrosis 12/23/2021   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   GI bleed 12/23/2021   Scrotal edema 12/23/2021   Cholecystitis    Drug rash    Volume overload 12/30/2021   Somnolence 12/31/2021   Pressure injury of skin 01/04/2022   Resolved Ambulatory Problems    Diagnosis Date Noted   HEPATITIS C 03/24/2007   Hepatic cirrhosis ([facility]) 03/24/2007   AKA, RIGHT, HX OF 12/23/2007   Acute hepatic encephalopathy ([facility]) 04/07/2018   Abnormal MRI, liver 12/20/2021   Past Medical History:  Diagnosis Date   ANXIETY 03/24/2007   Chronic pain    CIRRHOSIS 03/24/2007   FRACTURE, WRIST, LEFT 10/12/2009   HYPERLIPIDEMIA 09/12/2009   Pancytopenia 12/23/2007   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   Squamous cell carcinoma of skin 05/21/2018   Thyroid disease    UROLITHIASIS, HX OF 12/23/2007    SOCIAL HX:  Social History   Tobacco Use   Smoking status: Never   Smokeless tobacco: Never  Substance Use Topics   Alcohol use: Not Currently    Alcohol/week: 0.0 standard drinks of alcohol     FAMILY HX:  Family History  Problem Relation Age of Onset   Heart disease Mother    Lung cancer Mother 20   Heart attack Paternal Grandmother    Heart attack Paternal Grandfather    Liver cancer Neg Hx       ALLERGIES:  Allergies  Allergen Reactions   Clindamycin Dermatitis   Unasyn [Ampicillin-Sulbactam Sodium] Rash    Diffuse whole body rash 12/28/21  PERTINENT MEDICATIONS:  Outpatient Encounter Medications as of 01/12/2022  Medication Sig   acetaminophen (TYLENOL) 325 MG tablet Take 2 tablets (650 mg total) by mouth every 6 (six) hours as needed for fever or mild pain.   ALPRAZolam (XANAX) 1 MG tablet Take 1 tablet (1 mg total) by mouth 3 (three) times daily as needed for  anxiety.   Cholecalciferol (VITAMIN D3) 10 MCG (400 UNIT) CAPS Take 4 capsules by mouth daily.   fentaNYL (DURAGESIC) 25 MCG/HR Place 1 patch onto the skin every 3 (three) days.   folic acid (FOLVITE) 1 MG tablet TAKE 1 TABLET(1 MG) BY MOUTH DAILY (Patient taking differently: Take 1 mg by mouth daily.)   furosemide (LASIX) 80 MG tablet Take 1 tablet (80 mg total) by mouth daily.   lactulose (CHRONULAC) 10 GM/15ML solution Take 30 mLs (20 g total) by mouth daily.   levothyroxine (SYNTHROID) 50 MCG tablet TAKE 1 TABLET(50 MCG) BY MOUTH DAILY BEFORE BREAKFAST (Patient taking differently: Take 50 mcg by mouth daily before breakfast.)   losartan (COZAAR) 100 MG tablet TAKE 1 TABLET(100 MG) BY MOUTH DAILY (Patient taking differently: Take 100 mg by mouth daily.)   lubiprostone (AMITIZA) 24 MCG capsule Take 24 mcg by mouth daily as needed.   Multiple Vitamin (MULTIVITAMIN WITH MINERALS) TABS tablet Take 1 tablet by mouth daily.   NARCAN 4 MG/0.1ML LIQD nasal spray kit Place 1 spray into the nose once.   oxyCODONE 10 MG TABS Take 2.5 tablets (25 mg total) by mouth every 4 (four) hours as needed (moderate pain).   pantoprazole (PROTONIX) 40 MG tablet Take 1 tablet (40 mg total) by mouth daily.   QUEtiapine (SEROQUEL) 25 MG tablet Take 1 tablet (25 mg total) by mouth at bedtime.   vitamin B-12 (CYANOCOBALAMIN) 1000 MCG tablet Take 1 tablet (1,000 mcg total) by mouth daily.   No facility-administered encounter medications on file as of 01/12/2022.     Thank you for the opportunity to participate in the care of [name].  The palliative care team will continue to follow. Please call our office at [phone] if we can be of additional assistance.   Note: Portions of this note were generated with [employer]. Dictation errors may occur despite best attempts at proofreading.  Teodoro Spray, NP

## 2022-01-15 ENCOUNTER — Inpatient Hospital Stay (HOSPITAL_COMMUNITY)
Admission: EM | Admit: 2022-01-15 | Discharge: 2022-02-01 | DRG: 871 | Disposition: A | Discharge status: Hospice | Payer: Medicare Other | Source: Skilled Nursing Facility | Attending: Internal Medicine | Admitting: Internal Medicine

## 2022-01-15 ENCOUNTER — Inpatient Hospital Stay (HOSPITAL_COMMUNITY): Payer: Medicare Other

## 2022-01-15 ENCOUNTER — Emergency Department (HOSPITAL_COMMUNITY): Payer: Medicare Other

## 2022-01-15 ENCOUNTER — Encounter (HOSPITAL_COMMUNITY): Payer: Self-pay | Admitting: Internal Medicine

## 2022-01-15 DIAGNOSIS — Z79899 Other long term (current) drug therapy: Secondary | ICD-10-CM

## 2022-01-15 DIAGNOSIS — D500 Iron deficiency anemia secondary to blood loss (chronic): Secondary | ICD-10-CM | POA: Diagnosis present

## 2022-01-15 DIAGNOSIS — F419 Anxiety disorder, unspecified: Secondary | ICD-10-CM | POA: Diagnosis present

## 2022-01-15 DIAGNOSIS — R6520 Severe sepsis without septic shock: Secondary | ICD-10-CM | POA: Diagnosis not present

## 2022-01-15 DIAGNOSIS — E039 Hypothyroidism, unspecified: Secondary | ICD-10-CM | POA: Diagnosis present

## 2022-01-15 DIAGNOSIS — L97521 Non-pressure chronic ulcer of other part of left foot limited to breakdown of skin: Secondary | ICD-10-CM | POA: Diagnosis not present

## 2022-01-15 DIAGNOSIS — G546 Phantom limb syndrome with pain: Secondary | ICD-10-CM | POA: Diagnosis present

## 2022-01-15 DIAGNOSIS — D591 Autoimmune hemolytic anemia, unspecified: Secondary | ICD-10-CM | POA: Diagnosis present

## 2022-01-15 DIAGNOSIS — D684 Acquired coagulation factor deficiency: Secondary | ICD-10-CM | POA: Diagnosis present

## 2022-01-15 DIAGNOSIS — Z6830 Body mass index (BMI) 30.0-30.9, adult: Secondary | ICD-10-CM

## 2022-01-15 DIAGNOSIS — B1920 Unspecified viral hepatitis C without hepatic coma: Secondary | ICD-10-CM | POA: Diagnosis present

## 2022-01-15 DIAGNOSIS — K5730 Diverticulosis of large intestine without perforation or abscess without bleeding: Secondary | ICD-10-CM | POA: Diagnosis present

## 2022-01-15 DIAGNOSIS — R5381 Other malaise: Secondary | ICD-10-CM | POA: Diagnosis present

## 2022-01-15 DIAGNOSIS — K7460 Unspecified cirrhosis of liver: Secondary | ICD-10-CM | POA: Diagnosis present

## 2022-01-15 DIAGNOSIS — N179 Acute kidney failure, unspecified: Secondary | ICD-10-CM | POA: Diagnosis present

## 2022-01-15 DIAGNOSIS — Z9689 Presence of other specified functional implants: Secondary | ICD-10-CM | POA: Diagnosis present

## 2022-01-15 DIAGNOSIS — Z602 Problems related to living alone: Secondary | ICD-10-CM | POA: Diagnosis present

## 2022-01-15 DIAGNOSIS — E1165 Type 2 diabetes mellitus with hyperglycemia: Secondary | ICD-10-CM | POA: Diagnosis present

## 2022-01-15 DIAGNOSIS — Z801 Family history of malignant neoplasm of trachea, bronchus and lung: Secondary | ICD-10-CM

## 2022-01-15 DIAGNOSIS — E669 Obesity, unspecified: Secondary | ICD-10-CM | POA: Diagnosis present

## 2022-01-15 DIAGNOSIS — I4892 Unspecified atrial flutter: Secondary | ICD-10-CM | POA: Diagnosis present

## 2022-01-15 DIAGNOSIS — A419 Sepsis, unspecified organism: Secondary | ICD-10-CM | POA: Diagnosis present

## 2022-01-15 DIAGNOSIS — N5089 Other specified disorders of the male genital organs: Secondary | ICD-10-CM | POA: Diagnosis not present

## 2022-01-15 DIAGNOSIS — K7682 Hepatic encephalopathy: Secondary | ICD-10-CM | POA: Diagnosis present

## 2022-01-15 DIAGNOSIS — Z515 Encounter for palliative care: Secondary | ICD-10-CM | POA: Diagnosis not present

## 2022-01-15 DIAGNOSIS — I480 Paroxysmal atrial fibrillation: Secondary | ICD-10-CM | POA: Diagnosis not present

## 2022-01-15 DIAGNOSIS — Z8249 Family history of ischemic heart disease and other diseases of the circulatory system: Secondary | ICD-10-CM

## 2022-01-15 DIAGNOSIS — Z85828 Personal history of other malignant neoplasm of skin: Secondary | ICD-10-CM

## 2022-01-15 DIAGNOSIS — I10 Essential (primary) hypertension: Secondary | ICD-10-CM | POA: Diagnosis present

## 2022-01-15 DIAGNOSIS — I70245 Atherosclerosis of native arteries of left leg with ulceration of other part of foot: Secondary | ICD-10-CM | POA: Diagnosis not present

## 2022-01-15 DIAGNOSIS — G9340 Encephalopathy, unspecified: Secondary | ICD-10-CM | POA: Diagnosis not present

## 2022-01-15 DIAGNOSIS — G894 Chronic pain syndrome: Secondary | ICD-10-CM | POA: Diagnosis present

## 2022-01-15 DIAGNOSIS — E1151 Type 2 diabetes mellitus with diabetic peripheral angiopathy without gangrene: Secondary | ICD-10-CM | POA: Diagnosis present

## 2022-01-15 DIAGNOSIS — R627 Adult failure to thrive: Secondary | ICD-10-CM | POA: Diagnosis present

## 2022-01-15 DIAGNOSIS — E785 Hyperlipidemia, unspecified: Secondary | ICD-10-CM | POA: Diagnosis present

## 2022-01-15 DIAGNOSIS — I9589 Other hypotension: Secondary | ICD-10-CM | POA: Diagnosis not present

## 2022-01-15 DIAGNOSIS — K811 Chronic cholecystitis: Secondary | ICD-10-CM | POA: Diagnosis present

## 2022-01-15 DIAGNOSIS — L89896 Pressure-induced deep tissue damage of other site: Secondary | ICD-10-CM | POA: Diagnosis present

## 2022-01-15 DIAGNOSIS — Z881 Allergy status to other antibiotic agents status: Secondary | ICD-10-CM

## 2022-01-15 DIAGNOSIS — R404 Transient alteration of awareness: Principal | ICD-10-CM

## 2022-01-15 DIAGNOSIS — M6282 Rhabdomyolysis: Secondary | ICD-10-CM | POA: Diagnosis present

## 2022-01-15 DIAGNOSIS — E11621 Type 2 diabetes mellitus with foot ulcer: Secondary | ICD-10-CM | POA: Diagnosis present

## 2022-01-15 DIAGNOSIS — I4891 Unspecified atrial fibrillation: Secondary | ICD-10-CM | POA: Diagnosis not present

## 2022-01-15 DIAGNOSIS — L89150 Pressure ulcer of sacral region, unstageable: Secondary | ICD-10-CM | POA: Diagnosis present

## 2022-01-15 DIAGNOSIS — Z66 Do not resuscitate: Secondary | ICD-10-CM | POA: Diagnosis not present

## 2022-01-15 DIAGNOSIS — D61818 Other pancytopenia: Secondary | ICD-10-CM | POA: Diagnosis present

## 2022-01-15 DIAGNOSIS — D599 Acquired hemolytic anemia, unspecified: Secondary | ICD-10-CM | POA: Diagnosis not present

## 2022-01-15 DIAGNOSIS — Z20822 Contact with and (suspected) exposure to covid-19: Secondary | ICD-10-CM | POA: Diagnosis present

## 2022-01-15 DIAGNOSIS — Z888 Allergy status to other drugs, medicaments and biological substances status: Secondary | ICD-10-CM

## 2022-01-15 DIAGNOSIS — L03032 Cellulitis of left toe: Secondary | ICD-10-CM | POA: Diagnosis present

## 2022-01-15 DIAGNOSIS — I70222 Atherosclerosis of native arteries of extremities with rest pain, left leg: Secondary | ICD-10-CM | POA: Diagnosis present

## 2022-01-15 DIAGNOSIS — K2980 Duodenitis without bleeding: Secondary | ICD-10-CM | POA: Diagnosis present

## 2022-01-15 DIAGNOSIS — L97221 Non-pressure chronic ulcer of left calf limited to breakdown of skin: Secondary | ICD-10-CM | POA: Diagnosis not present

## 2022-01-15 DIAGNOSIS — R159 Full incontinence of feces: Secondary | ICD-10-CM | POA: Diagnosis not present

## 2022-01-15 DIAGNOSIS — E876 Hypokalemia: Secondary | ICD-10-CM | POA: Diagnosis present

## 2022-01-15 DIAGNOSIS — I471 Supraventricular tachycardia: Secondary | ICD-10-CM | POA: Diagnosis present

## 2022-01-15 DIAGNOSIS — D6959 Other secondary thrombocytopenia: Secondary | ICD-10-CM | POA: Diagnosis present

## 2022-01-15 DIAGNOSIS — Z89611 Acquired absence of right leg above knee: Secondary | ICD-10-CM

## 2022-01-15 DIAGNOSIS — K2970 Gastritis, unspecified, without bleeding: Secondary | ICD-10-CM | POA: Diagnosis present

## 2022-01-15 DIAGNOSIS — Z79890 Hormone replacement therapy (postmenopausal): Secondary | ICD-10-CM

## 2022-01-15 DIAGNOSIS — G9341 Metabolic encephalopathy: Secondary | ICD-10-CM | POA: Diagnosis present

## 2022-01-15 DIAGNOSIS — D649 Anemia, unspecified: Secondary | ICD-10-CM

## 2022-01-15 DIAGNOSIS — I739 Peripheral vascular disease, unspecified: Secondary | ICD-10-CM | POA: Diagnosis not present

## 2022-01-15 DIAGNOSIS — R52 Pain, unspecified: Secondary | ICD-10-CM | POA: Diagnosis not present

## 2022-01-15 DIAGNOSIS — R6521 Severe sepsis with septic shock: Secondary | ICD-10-CM | POA: Diagnosis present

## 2022-01-15 DIAGNOSIS — J9601 Acute respiratory failure with hypoxia: Secondary | ICD-10-CM | POA: Diagnosis present

## 2022-01-15 DIAGNOSIS — L97929 Non-pressure chronic ulcer of unspecified part of left lower leg with unspecified severity: Secondary | ICD-10-CM | POA: Diagnosis present

## 2022-01-15 DIAGNOSIS — Z79891 Long term (current) use of opiate analgesic: Secondary | ICD-10-CM

## 2022-01-15 DIAGNOSIS — R630 Anorexia: Secondary | ICD-10-CM | POA: Diagnosis present

## 2022-01-15 DIAGNOSIS — Z7401 Bed confinement status: Secondary | ICD-10-CM

## 2022-01-15 DIAGNOSIS — N170 Acute kidney failure with tubular necrosis: Secondary | ICD-10-CM | POA: Diagnosis present

## 2022-01-15 DIAGNOSIS — E872 Acidosis, unspecified: Secondary | ICD-10-CM | POA: Diagnosis present

## 2022-01-15 DIAGNOSIS — K648 Other hemorrhoids: Secondary | ICD-10-CM | POA: Diagnosis present

## 2022-01-15 DIAGNOSIS — Z7189 Other specified counseling: Secondary | ICD-10-CM | POA: Diagnosis not present

## 2022-01-15 LAB — URINALYSIS, ROUTINE W REFLEX MICROSCOPIC
Bilirubin Urine: NEGATIVE
Glucose, UA: NEGATIVE mg/dL
Hgb urine dipstick: NEGATIVE
Ketones, ur: NEGATIVE mg/dL
Leukocytes,Ua: NEGATIVE
Nitrite: NEGATIVE
Protein, ur: NEGATIVE mg/dL
Specific Gravity, Urine: 1.012 (ref 1.005–1.030)
pH: 5 (ref 5.0–8.0)

## 2022-01-15 LAB — CBC WITH DIFFERENTIAL/PLATELET
Abs Immature Granulocytes: 0.03 10*3/uL (ref 0.00–0.07)
Basophils Absolute: 0 10*3/uL (ref 0.0–0.1)
Basophils Relative: 0 %
Eosinophils Absolute: 0 10*3/uL (ref 0.0–0.5)
Eosinophils Relative: 0 %
HCT: 18.1 % — ABNORMAL LOW (ref 39.0–52.0)
Hemoglobin: 5.8 g/dL — CL (ref 13.0–17.0)
Immature Granulocytes: 1 %
Lymphocytes Relative: 35 %
Lymphs Abs: 1.4 10*3/uL (ref 0.7–4.0)
MCH: 31.7 pg (ref 26.0–34.0)
MCHC: 32 g/dL (ref 30.0–36.0)
MCV: 98.9 fL (ref 80.0–100.0)
Monocytes Absolute: 0.3 10*3/uL (ref 0.1–1.0)
Monocytes Relative: 7 %
Neutro Abs: 2.4 10*3/uL (ref 1.7–7.7)
Neutrophils Relative %: 57 %
Platelets: 35 10*3/uL — ABNORMAL LOW (ref 150–400)
RBC: 1.83 MIL/uL — ABNORMAL LOW (ref 4.22–5.81)
RDW: 22.3 % — ABNORMAL HIGH (ref 11.5–15.5)
WBC: 4.2 10*3/uL (ref 4.0–10.5)
nRBC: 0 % (ref 0.0–0.2)

## 2022-01-15 LAB — COMPREHENSIVE METABOLIC PANEL
ALT: 37 U/L (ref 0–44)
AST: 58 U/L — ABNORMAL HIGH (ref 15–41)
Albumin: 2.3 g/dL — ABNORMAL LOW (ref 3.5–5.0)
Alkaline Phosphatase: 115 U/L (ref 38–126)
Anion gap: 13 (ref 5–15)
BUN: 100 mg/dL — ABNORMAL HIGH (ref 8–23)
CO2: 24 mmol/L (ref 22–32)
Calcium: 8 mg/dL — ABNORMAL LOW (ref 8.9–10.3)
Chloride: 101 mmol/L (ref 98–111)
Creatinine, Ser: 2.09 mg/dL — ABNORMAL HIGH (ref 0.61–1.24)
GFR, Estimated: 33 mL/min — ABNORMAL LOW (ref >60)
Glucose, Bld: 154 mg/dL — ABNORMAL HIGH (ref 70–99)
Potassium: 3.3 mmol/L — ABNORMAL LOW (ref 3.5–5.1)
Sodium: 138 mmol/L (ref 135–145)
Total Bilirubin: 2.9 mg/dL — ABNORMAL HIGH (ref 0.3–1.2)
Total Protein: 5.5 g/dL — ABNORMAL LOW (ref 6.5–8.1)

## 2022-01-15 LAB — PROTIME-INR
INR: 1.4 — ABNORMAL HIGH (ref 0.8–1.2)
Prothrombin Time: 17.1 seconds — ABNORMAL HIGH (ref 11.4–15.2)

## 2022-01-15 LAB — GLUCOSE, CAPILLARY
Glucose-Capillary: 107 mg/dL — ABNORMAL HIGH (ref 70–99)
Glucose-Capillary: 156 mg/dL — ABNORMAL HIGH (ref 70–99)

## 2022-01-15 LAB — DIC (DISSEMINATED INTRAVASCULAR COAGULATION)PANEL
D-Dimer, Quant: 4.74 ug/mL-FEU — ABNORMAL HIGH (ref 0.00–0.50)
Fibrinogen: 523 mg/dL — ABNORMAL HIGH (ref 210–475)
INR: >10 (ref 0.8–1.2)
Platelets: 28 10*3/uL — CL (ref 150–400)
Prothrombin Time: >90 seconds — ABNORMAL HIGH (ref 11.4–15.2)
Smear Review: NONE SEEN
aPTT: >200 seconds (ref 24–36)

## 2022-01-15 LAB — POC OCCULT BLOOD, ED: Fecal Occult Bld: NEGATIVE

## 2022-01-15 LAB — MRSA NEXT GEN BY PCR, NASAL: MRSA by PCR Next Gen: NOT DETECTED

## 2022-01-15 LAB — PROCALCITONIN: Procalcitonin: 0.71 ng/mL

## 2022-01-15 LAB — AMMONIA: Ammonia: 64 umol/L — ABNORMAL HIGH (ref 9–35)

## 2022-01-15 LAB — LACTIC ACID, PLASMA
Lactic Acid, Venous: 2 mmol/L (ref 0.5–1.9)
Lactic Acid, Venous: 2.6 mmol/L (ref 0.5–1.9)
Lactic Acid, Venous: 1.9 mmol/L (ref 0.5–1.9)
Lactic Acid, Venous: 2.2 mmol/L (ref 0.5–1.9)

## 2022-01-15 LAB — SAVE SMEAR(SSMR), FOR PROVIDER SLIDE REVIEW

## 2022-01-15 LAB — APTT: aPTT: 28 seconds (ref 24–36)

## 2022-01-15 LAB — CBG MONITORING, ED: Glucose-Capillary: 154 mg/dL — ABNORMAL HIGH (ref 70–99)

## 2022-01-15 LAB — RESP PANEL BY RT-PCR (FLU A&B, COVID) ARPGX2
Influenza A by PCR: NEGATIVE
Influenza B by PCR: NEGATIVE
SARS Coronavirus 2 by RT PCR: NEGATIVE

## 2022-01-15 LAB — PREPARE RBC (CROSSMATCH)

## 2022-01-15 MED ORDER — FENTANYL CITRATE PF 50 MCG/ML IJ SOSY
25.0000 ug | PREFILLED_SYRINGE | Freq: Once | INTRAMUSCULAR | Status: AC
  Administered 2022-01-15: 25 ug via INTRAVENOUS
  Filled 2022-01-15: qty 1

## 2022-01-15 MED ORDER — SODIUM CHLORIDE 0.9 % IV SOLN
2.0000 g | Freq: Two times a day (BID) | INTRAVENOUS | Status: DC
  Administered 2022-01-15: 2 g via INTRAVENOUS
  Filled 2022-01-15: qty 12.5

## 2022-01-15 MED ORDER — VANCOMYCIN HCL IN DEXTROSE 1-5 GM/200ML-% IV SOLN: 1000.0000 mg | Freq: Once | INTRAVENOUS | Status: DC

## 2022-01-15 MED ORDER — LACTULOSE 10 GM/15ML PO SOLN
30.0000 g | Freq: Three times a day (TID) | ORAL | Status: DC
  Administered 2022-01-16 – 2022-01-17 (×4): 30 g
  Filled 2022-01-15 (×5): qty 45

## 2022-01-15 MED ORDER — ORAL CARE MOUTH RINSE
15.0000 mL | OROMUCOSAL | Status: DC | PRN
  Administered 2022-01-16: 15 mL via OROMUCOSAL

## 2022-01-15 MED ORDER — CHLORHEXIDINE GLUCONATE CLOTH 2 % EX PADS
6.0000 | MEDICATED_PAD | Freq: Every day | CUTANEOUS | Status: DC
  Administered 2022-01-15 – 2022-01-30 (×17): 6 via TOPICAL

## 2022-01-15 MED ORDER — SODIUM CHLORIDE 0.9 % IV SOLN
10.0000 mL/h | Freq: Once | INTRAVENOUS | Status: AC
  Administered 2022-01-15: 10 mL/h via INTRAVENOUS

## 2022-01-15 MED ORDER — LACTATED RINGERS IV BOLUS (SEPSIS)
500.0000 mL | Freq: Once | INTRAVENOUS | Status: AC
  Administered 2022-01-15: 500 mL via INTRAVENOUS

## 2022-01-15 MED ORDER — INSULIN ASPART 100 UNIT/ML IJ SOLN: 2.0000 [IU] | INTRAMUSCULAR | Status: DC

## 2022-01-15 MED ORDER — LACTATED RINGERS IV BOLUS (SEPSIS)
1000.0000 mL | Freq: Once | INTRAVENOUS | Status: AC
  Administered 2022-01-15: 1000 mL via INTRAVENOUS

## 2022-01-15 MED ORDER — SODIUM CHLORIDE 0.9 % IV SOLN: INTRAVENOUS | Status: DC

## 2022-01-15 MED ORDER — DOCUSATE SODIUM 100 MG PO CAPS: 100.0000 mg | ORAL_CAPSULE | Freq: Two times a day (BID) | ORAL | Status: DC | PRN

## 2022-01-15 MED ORDER — POLYETHYLENE GLYCOL 3350 17 G PO PACK: 17.0000 g | PACK | Freq: Every day | ORAL | Status: DC | PRN

## 2022-01-15 MED ORDER — PANTOPRAZOLE SODIUM 40 MG IV SOLR: 40.0000 mg | Freq: Every day | INTRAVENOUS | Status: DC

## 2022-01-15 MED ORDER — POTASSIUM CHLORIDE 10 MEQ/100ML IV SOLN
10.0000 meq | INTRAVENOUS | Status: AC
  Administered 2022-01-15 – 2022-01-16 (×4): 10 meq via INTRAVENOUS
  Filled 2022-01-15 (×4): qty 100

## 2022-01-15 MED ORDER — MORPHINE SULFATE (PF) 2 MG/ML IV SOLN
1.0000 mg | Freq: Three times a day (TID) | INTRAVENOUS | Status: DC | PRN
  Administered 2022-01-15: 1 mg via INTRAVENOUS
  Filled 2022-01-15: qty 1

## 2022-01-15 MED ORDER — LACTULOSE 10 GM/15ML PO SOLN: 30.0000 g | Freq: Three times a day (TID) | ORAL | Status: DC

## 2022-01-15 MED ORDER — PANTOPRAZOLE SODIUM 40 MG IV SOLR
40.0000 mg | Freq: Two times a day (BID) | INTRAVENOUS | Status: DC
  Administered 2022-01-15 – 2022-01-30 (×31): 40 mg via INTRAVENOUS
  Filled 2022-01-15 (×31): qty 10

## 2022-01-15 MED ORDER — SODIUM CHLORIDE 0.9 % IV SOLN
250.0000 mL | INTRAVENOUS | Status: DC
  Administered 2022-01-20: 1000 mL via INTRAVENOUS
  Administered 2022-01-21 – 2022-01-27 (×2): 250 mL via INTRAVENOUS

## 2022-01-15 MED ORDER — ALBUMIN HUMAN 25 % IV SOLN
25.0000 g | Freq: Four times a day (QID) | INTRAVENOUS | Status: DC
Start: 2022-01-15 — End: 2022-01-19
  Administered 2022-01-15 – 2022-01-19 (×16): 25 g via INTRAVENOUS
  Filled 2022-01-15 (×18): qty 100

## 2022-01-15 MED ORDER — NOREPINEPHRINE 4 MG/250ML-% IV SOLN
2.0000 ug/min | INTRAVENOUS | Status: DC
  Administered 2022-01-15: 2 ug/min via INTRAVENOUS
  Filled 2022-01-15: qty 250

## 2022-01-15 MED ORDER — LACTATED RINGERS IV BOLUS (SEPSIS)
1000.0000 mL | Freq: Once | INTRAVENOUS | Status: AC
Start: 2022-01-15 — End: 2022-01-15
  Administered 2022-01-15: 1000 mL via INTRAVENOUS

## 2022-01-15 MED ORDER — SODIUM CHLORIDE 0.9 % IV SOLN
2.0000 g | Freq: Once | INTRAVENOUS | Status: AC
  Administered 2022-01-15: 2 g via INTRAVENOUS
  Filled 2022-01-15: qty 12.5

## 2022-01-15 MED ORDER — METHYLPREDNISOLONE SODIUM SUCC 125 MG IJ SOLR
60.0000 mg | Freq: Two times a day (BID) | INTRAMUSCULAR | Status: DC
  Administered 2022-01-15 – 2022-01-18 (×6): 60 mg via INTRAVENOUS
  Filled 2022-01-15 (×6): qty 2

## 2022-01-15 MED ORDER — VANCOMYCIN HCL 2000 MG/400ML IV SOLN
2000.0000 mg | Freq: Once | INTRAVENOUS | Status: AC
  Administered 2022-01-15: 2000 mg via INTRAVENOUS
  Filled 2022-01-15: qty 400

## 2022-01-15 MED ORDER — PHENOL 1.4 % MT LIQD
1.0000 | OROMUCOSAL | Status: DC | PRN
  Administered 2022-01-15: 1 via OROMUCOSAL
  Filled 2022-01-15: qty 177

## 2022-01-15 MED ORDER — METRONIDAZOLE 500 MG/100ML IV SOLN
500.0000 mg | Freq: Once | INTRAVENOUS | Status: AC
Start: 2022-01-15 — End: 2022-01-15
  Administered 2022-01-15: 500 mg via INTRAVENOUS
  Filled 2022-01-15: qty 100

## 2022-01-15 MED ORDER — INSULIN ASPART 100 UNIT/ML IJ SOLN
0.0000 [IU] | INTRAMUSCULAR | Status: DC
  Administered 2022-01-16 (×3): 3 [IU] via SUBCUTANEOUS
  Administered 2022-01-16: 5 [IU] via SUBCUTANEOUS
  Administered 2022-01-16: 3 [IU] via SUBCUTANEOUS
  Administered 2022-01-16 (×2): 5 [IU] via SUBCUTANEOUS
  Administered 2022-01-17 (×4): 3 [IU] via SUBCUTANEOUS
  Administered 2022-01-17: 5 [IU] via SUBCUTANEOUS
  Administered 2022-01-17 – 2022-01-18 (×2): 3 [IU] via SUBCUTANEOUS
  Administered 2022-01-18 (×2): 2 [IU] via SUBCUTANEOUS
  Administered 2022-01-18 – 2022-01-19 (×3): 3 [IU] via SUBCUTANEOUS
  Administered 2022-01-19: 2 [IU] via SUBCUTANEOUS
  Administered 2022-01-19 (×2): 5 [IU] via SUBCUTANEOUS
  Administered 2022-01-19 – 2022-01-20 (×2): 3 [IU] via SUBCUTANEOUS
  Administered 2022-01-20 (×3): 2 [IU] via SUBCUTANEOUS
  Administered 2022-01-20 (×2): 3 [IU] via SUBCUTANEOUS
  Administered 2022-01-21: 5 [IU] via SUBCUTANEOUS
  Administered 2022-01-21: 2 [IU] via SUBCUTANEOUS
  Administered 2022-01-21: 3 [IU] via SUBCUTANEOUS
  Administered 2022-01-21: 5 [IU] via SUBCUTANEOUS
  Administered 2022-01-21: 3 [IU] via SUBCUTANEOUS
  Administered 2022-01-22: 2 [IU] via SUBCUTANEOUS
  Administered 2022-01-22: 3 [IU] via SUBCUTANEOUS
  Administered 2022-01-22 – 2022-01-23 (×3): 2 [IU] via SUBCUTANEOUS
  Administered 2022-01-23: 3 [IU] via SUBCUTANEOUS
  Administered 2022-01-23 (×2): 2 [IU] via SUBCUTANEOUS
  Administered 2022-01-23: 3 [IU] via SUBCUTANEOUS
  Administered 2022-01-24 (×4): 2 [IU] via SUBCUTANEOUS
  Administered 2022-01-25: 11 [IU] via SUBCUTANEOUS
  Administered 2022-01-25 – 2022-01-26 (×3): 3 [IU] via SUBCUTANEOUS
  Administered 2022-01-26 (×2): 2 [IU] via SUBCUTANEOUS
  Administered 2022-01-26 – 2022-01-27 (×3): 3 [IU] via SUBCUTANEOUS
  Administered 2022-01-27 – 2022-01-29 (×10): 2 [IU] via SUBCUTANEOUS
  Administered 2022-01-29: 3 [IU] via SUBCUTANEOUS
  Administered 2022-01-29: 2 [IU] via SUBCUTANEOUS
  Administered 2022-01-29: 3 [IU] via SUBCUTANEOUS
  Administered 2022-01-30 (×3): 2 [IU] via SUBCUTANEOUS
  Filled 2022-01-15: qty 0.15

## 2022-01-15 MED ORDER — VANCOMYCIN HCL IN DEXTROSE 1-5 GM/200ML-% IV SOLN: 1000.0000 mg | INTRAVENOUS | Status: DC

## 2022-01-15 MED ORDER — LACTATED RINGERS IV SOLN: INTRAVENOUS | Status: DC

## 2022-01-15 NOTE — ED Notes (Signed)
Patient transported to CT 

## 2022-01-15 NOTE — Sepsis Progress Note (Signed)
Sepsis protocol monitored by eLink 

## 2022-01-15 NOTE — ED Triage Notes (Signed)
Pt to ED via EMS from [facility] , failure to thrive and fever. Initial GCS 11. Sinus Tach. CBG 222. Hx: liver failure- Jaundice is baseline. #22 right thumb. , #20 L hand. 500 fluid bolus given.  Last VS 80/40, 96%2l, HR 108. BKA right leg, Multiple wounds to left leg.

## 2022-01-15 NOTE — ED Notes (Signed)
Bladder scan shows 59 mls, This RN notified attending. No news orders at this time. Will continue to monitor,

## 2022-01-15 NOTE — H&P (Incomplete)
History and Physical    Derek Daniello Barton Jr. ZDG:[id number] DOB: 1951-05-05 DOA: 01/15/2022  PCP: [name], FNP Patient coming from: snf  Chief Complaint: confusion fever  HPI: Derek Anon Fetterolf [name]. is a 71 y.o. male with medical history significant of   Cirrhosis, hemolytic anemia, chronic thrombocytopenia and leukopenia followed by hematology, right above-knee amputation, peripheral vascular disease, splenic infarction, hepatitis C, history of alcohol abuse and hepatic encephalopathy admitted with increasing confusion at the nursing facility.  History obtained from the ER records and some from the patient's family at bedside.  Denies any blood in his stool denies hematemesis hematochezia melena nausea vomiting diarrhea.  He complains of abdominal pain.  No urinary symptoms.  In the ED Hemoccult was negative.  His hemoglobin was 5.8.  His lactate was elevated at 2.6.  And his BUN and creatinine were very high at 100 and 2.2, lactic acid trending up Potassium 3.3 Patient unable to provide any history.  During his last admission he was treated with Levophed IV fluids and transfusion. His ammonia level was 64.  Flu and COVID and negative Patient was admitted to the hospital 12/18/2021 through 01/04/2022 and was discharged to skilled nursing facility. Admission patient was admitted with septic shock requiring vasopressors.  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  General surgery was consulted at that time a cholecystostomy tube was placed by IR on 12/23/2021.  Biliary cultures grew pansensitive Enterococcus faecalis and lactobacillus.  He was treated with antibiotics fluconazole for Candida.  Also had left thigh pain rhabdomyolysis and myonecrosis. Bladder scan in the ED 59 cc of urine is a condom cath in place no urine output in the ED  CT abdomen and pelvis-cholecystostomy tube in place with decompression of the gallbladder, layering  stones are present in the gallbladder, mild wall thickening is present, moderate stool at the rectum without obstruction, sigmoid diverticulosis without diverticulitis, L2 compression fracture remote. 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   Squamous cell carcinoma of skin 05/21/2018   R mid volar forearm   Thyroid disease    UROLITHIASIS, HX OF 12/23/2007    Past Surgical History:  Procedure Laterality Date   ABOVE KNEE LEG AMPUTATION Right    Right femur fracture, 11/1994, resulted in right AKA 2006 after staph infection   COLONOSCOPY N/A 12/16/2020   Procedure: COLONOSCOPY;  Surgeon: Virgel Manifold, MD;  Location: [facility] ENDOSCOPY;  Service: Endoscopy;  Laterality: N/A;   ESOPHAGOGASTRODUODENOSCOPY N/A 12/16/2020   Procedure: ESOPHAGOGASTRODUODENOSCOPY (EGD);  Surgeon: Virgel Manifold, MD;  Location: [facility] ENDOSCOPY;  Service: Endoscopy;  Laterality: N/A;   IR PERC CHOLECYSTOSTOMY  12/23/2021    Social History   Socioeconomic History   Marital status: Widowed  Spouse name: Not on file   Number of children: Not on file   Years of education: Not on file   Highest education level: Not on file  Occupational History   Occupation: Disabled  Tobacco Use   Smoking status: Never   Smokeless tobacco:  Never  Substance and Sexual Activity   Alcohol use: Not Currently    Alcohol/week: 0.0 standard drinks of alcohol   Drug use: No   Sexual activity: Not on file  Other Topics Concern   Not on file  Social History Narrative   Widowed 1997   Lives alone   Lost a son in his arms in 80.    3 sons   Social Determinants of Health   Financial Resource Strain: Low Risk  (08/08/2020)   Overall Financial Resource Strain (CARDIA)    Difficulty of Paying Living Expenses: Not hard at all  Food Insecurity: No Food Insecurity (08/08/2020)   Hunger Vital Sign    Worried About Running Out of Food in the Last Year: Never true    [place] in the Last Year: Never true  Transportation Needs: No Transportation Needs (08/08/2020)   PRAPARE - Hydrologist (Medical): No    Lack of Transportation (Non-Medical): No  Physical Activity: Not on file  Stress: No Stress Concern Present (08/08/2020)   [place]    Feeling of Stress : Not at all  Social Connections: Unknown (08/08/2020)   Social Connection and Isolation Panel [NHANES]    Frequency of Communication with Friends and Family: More than three times a week    Frequency of Social Gatherings with Friends and Family: More than three times a week    Attends Religious Services: Not on file    Active Member of Clubs or Organizations: Not on file    Attends Archivist Meetings: Not on file    Marital Status: Widowed  Intimate Partner Violence: Not At Risk (08/08/2020)   Humiliation, Afraid, Rape, and Kick questionnaire    Fear of Current or Ex-Partner: No    Emotionally Abused: No    Physically Abused: No    Sexually Abused: No    Allergies  Allergen Reactions   Clindamycin Dermatitis   Unasyn [Ampicillin-Sulbactam Sodium] Rash    Diffuse whole body rash 12/28/21    Family History  Problem Relation Age of Onset   Heart disease Mother     Lung cancer Mother 53   Heart attack Paternal Grandmother    Heart attack Paternal Grandfather    Liver cancer Neg Hx       Prior to Admission medications   Medication Sig Start Date End Date Taking? Authorizing Provider  acetaminophen (TYLENOL) 325 MG tablet Take 2 tablets (650 mg total) by mouth every 6 (six) hours as needed for fever or mild pain. 01/04/22   Caren Griffins, MD  ALPRAZolam Duanne Moron) 1 MG tablet Take 1 tablet (1 mg total) by mouth 3 (three) times daily as needed for anxiety. 01/04/22   Caren Griffins, MD  Cholecalciferol (VITAMIN D3) 10 MCG (400 UNIT) CAPS Take 4 capsules by mouth daily.    [provider]  fentaNYL (DURAGESIC) 25 MCG/HR Place 1 patch onto the skin every 3 (three) days. 01/04/22   Caren Griffins, MD  folic acid (FOLVITE) 1 MG tablet TAKE 1 TABLET(1 MG) BY MOUTH DAILY Patient taking differently: Take 1 mg by mouth daily. 10/12/21  Jacquelin Hawking, NP  furosemide (LASIX) 80 MG tablet Take 1 tablet (80 mg total) by mouth daily. 01/04/22 01/04/23  Caren Griffins, MD  lactulose (CHRONULAC) 10 GM/15ML solution Take 30 mLs (20 g total) by mouth daily. 01/04/22   Caren Griffins, MD  levothyroxine (SYNTHROID) 50 MCG tablet TAKE 1 TABLET(50 MCG) BY MOUTH DAILY BEFORE BREAKFAST Patient taking differently: Take 50 mcg by mouth daily before breakfast. 12/14/21   [name], FNP  losartan (COZAAR) 100 MG tablet TAKE 1 TABLET(100 MG) BY MOUTH DAILY Patient taking differently: Take 100 mg by mouth daily. 11/14/21   [name], FNP  lubiprostone (AMITIZA) 24 MCG capsule Take 24 mcg by mouth daily as needed. 08/13/14   [provider]  Multiple Vitamin (MULTIVITAMIN WITH MINERALS) TABS tablet Take 1 tablet by mouth daily. 01/05/22   Caren Griffins, MD  NARCAN 4 MG/0.1ML LIQD nasal spray kit Place 1 spray into the nose once. 12/29/18   [provider]  oxyCODONE 10 MG TABS Take 2.5 tablets (25 mg total) by mouth every 4 (four) hours as  needed (moderate pain). 01/04/22   Caren Griffins, MD  pantoprazole (PROTONIX) 40 MG tablet Take 1 tablet (40 mg total) by mouth daily. 12/18/20 12/18/21  Sharen Hones, MD  QUEtiapine (SEROQUEL) 25 MG tablet Take 1 tablet (25 mg total) by mouth at bedtime. 01/04/22   Caren Griffins, MD  vitamin B-12 (CYANOCOBALAMIN) 1000 MCG tablet Take 1 tablet (1,000 mcg total) by mouth daily. 04/09/18   Bettey Costa, MD    Physical Exam: Vitals:   01/15/22 1700 01/15/22 1725 01/15/22 1741 01/15/22 1745  BP: (!) 93/49 (!) 90/43 (!) 83/50 (!) 92/45  Pulse: (!) 101 (!) 104 (!) 102 (!) 103  Resp: 17 17 19 14   Temp:      TempSrc:      SpO2: 99% 99% 96% 98%  Weight:      Height:         General: Morbidly obese chronically ill looking male Eyes:  PERRL, EOMI, normal lids, iris ENT:  grossly normal hearing, lips & tongue, mmm Neck:  no LAD, masses or thyromegaly Cardiovascular:  RRR, no m/r/g. No LE edema.  Respiratory:  CTA bilaterally, no w/r/r. Normal respiratory effort. Abdomen:  soft,tender cholecystostomy tube in place  Skin:  no rash or induration seen on limited exam Musculoskeletal:  r aka Neurologic:  not oriented consfused  Labs on Admission: I have personally reviewed following labs and imaging studies  CBC: Recent Labs  Lab 01/15/22 1416  WBC 4.2  NEUTROABS PENDING  HGB 5.8*  HCT 18.1*  MCV 98.9  PLT 35*   Basic Metabolic Panel: Recent Labs  Lab 01/15/22 1416  NA 138  K 3.3*  CL 101  CO2 24  GLUCOSE 154*  BUN 100*  CREATININE 2.09*  CALCIUM 8.0*   GFR: Estimated Creatinine Clearance: 39.4 mL/min (A) (by C-G formula based on SCr of 2.09 mg/dL (H)). Liver Function Tests: Recent Labs  Lab 01/15/22 1416  AST 58*  ALT 37  ALKPHOS 115  BILITOT 2.9*  PROT 5.5*  ALBUMIN 2.3*   No results for input(s): "LIPASE", "AMYLASE" in the last 168 hours. Recent Labs  Lab 01/15/22 1416  AMMONIA 64*   Coagulation Profile: Recent Labs  Lab 01/15/22 1416  INR 1.4*    Cardiac Enzymes: No results for input(s): "CKTOTAL", "CKMB", "CKMBINDEX", "TROPONINI" in the last 168 hours. BNP (last 3 results) No results for input(s): "PROBNP"  in the last 8760 hours. HbA1C: No results for input(s): "HGBA1C" in the last 72 hours. CBG: Recent Labs  Lab 01/15/22 1408  GLUCAP 154*   Lipid Profile: No results for input(s): "CHOL", "HDL", "LDLCALC", "TRIG", "CHOLHDL", "LDLDIRECT" in the last 72 hours. Thyroid Function Tests: No results for input(s): "TSH", "T4TOTAL", "FREET4", "T3FREE", "THYROIDAB" in the last 72 hours. Anemia Panel: No results for input(s): "VITAMINB12", "FOLATE", "FERRITIN", "TIBC", "IRON", "RETICCTPCT" in the last 72 hours. Urine analysis:    Component Value Date/Time   COLORURINE AMBER (A) 12/18/2021 1139   APPEARANCEUR CLEAR 12/18/2021 1139   LABSPEC 1.013 12/18/2021 1139   PHURINE 5.0 12/18/2021 1139   GLUCOSEU NEGATIVE 12/18/2021 1139   GLUCOSEU NEGATIVE 01/06/2015 0912   HGBUR LARGE (A) 12/18/2021 1139   BILIRUBINUR NEGATIVE 12/18/2021 1139   KETONESUR 5 (A) 12/18/2021 1139   PROTEINUR 100 (A) 12/18/2021 1139   UROBILINOGEN 1.0 01/06/2015 0912   NITRITE NEGATIVE 12/18/2021 1139   LEUKOCYTESUR NEGATIVE 12/18/2021 1139    Creatinine Clearance: Estimated Creatinine Clearance: 39.4 mL/min (A) (by C-G formula based on SCr of 2.09 mg/dL (H)).  Sepsis Labs: @LABRCNTIP (procalcitonin:4,lacticidven:4) ) Recent Results (from the past 240 hour(s))  Resp Panel by RT-PCR (Flu A&B, Covid) Anterior Nasal Swab     Status: None   Collection Time: 01/15/22  3:22 PM   Specimen: Anterior Nasal Swab  Result Value Ref Range Status   SARS Coronavirus 2 by RT PCR NEGATIVE NEGATIVE Final    Comment: (NOTE) SARS-CoV-2 target nucleic acids are NOT DETECTED.  The SARS-CoV-2 RNA is generally detectable in upper respiratory specimens during the acute phase of infection. The lowest concentration of SARS-CoV-2 viral copies this assay can detect is 138  copies/mL. A negative result does not preclude SARS-Cov-2 infection and should not be used as the sole basis for treatment or other patient management decisions. A negative result may occur with  improper specimen collection/handling, submission of specimen other than nasopharyngeal swab, presence of viral mutation(s) within the areas targeted by this assay, and inadequate number of viral copies(<138 copies/mL). A negative result must be combined with clinical observations, patient history, and epidemiological information. The expected result is Negative.  Fact Sheet for Patients:  EntrepreneurPulse.com.au  Fact Sheet for Healthcare Providers:  IncredibleEmployment.be  This test is no t yet approved or cleared by the [place] FDA and  has been authorized for detection and/or diagnosis of SARS-CoV-2 by FDA under an Emergency Use Authorization (EUA). This EUA will remain  in effect (meaning this test can be used) for the duration of the COVID-19 declaration under Section 564(b)(1) of the Act, 21 U.S.C.section 360bbb-3(b)(1), unless the authorization is terminated  or revoked sooner.       Influenza A by PCR NEGATIVE NEGATIVE Final   Influenza B by PCR NEGATIVE NEGATIVE Final    Comment: (NOTE) The Xpert Xpress SARS-CoV-2/FLU/RSV plus assay is intended as an aid in the diagnosis of influenza from Nasopharyngeal swab specimens and should not be used as a sole basis for treatment. Nasal washings and aspirates are unacceptable for Xpert Xpress SARS-CoV-2/FLU/RSV testing.  Fact Sheet for Patients: EntrepreneurPulse.com.au  Fact Sheet for Healthcare Providers: IncredibleEmployment.be  This test is not yet approved or cleared by the [place] FDA and has been authorized for detection and/or diagnosis of SARS-CoV-2 by FDA under an Emergency Use Authorization (EUA). This EUA will remain in effect (meaning  this test can be used) for the duration of the COVID-19 declaration under Section 564(b)(1) of the Act, 21  U.S.C. section 360bbb-3(b)(1), unless the authorization is terminated or revoked.  Performed at [facility], [place] [address], [place], [place] [address]      Radiological Exams on Admission: CT ABDOMEN PELVIS WO CONTRAST  Result Date: 01/15/2022 CLINICAL DATA:  Abdominal pain, acute, non localized. Altered mental status. Hypotension. EXAM: CT ABDOMEN AND PELVIS WITHOUT CONTRAST TECHNIQUE: Multidetector CT imaging of the abdomen and pelvis was performed following the standard protocol without IV contrast. RADIATION DOSE REDUCTION: This exam was performed according to the departmental dose-optimization program which includes automated exposure control, adjustment of the mA and/or kV according to patient size and/or use of iterative reconstruction technique. COMPARISON:  Abdominal ultrasound that CT the abdomen pelvis 12/22/2021. FINDINGS: Lower chest: Bibasilar dependent airspace opacities are present. The heart size is normal. Blood volume is hypodense. Hepatobiliary: Liver is unremarkable. Cholecystostomy tube present. The gallbladder is decompressed. Layering stones are present. Mild wall thickening is present. Pericholecystic fluid no longer seen. No new fluid collections are present. Pancreas: Unremarkable. No pancreatic ductal dilatation or surrounding inflammatory changes. Spleen: Normal in size without focal abnormality. Adrenals/Urinary Tract: Adrenal glands are normal bilaterally. Kidneys are unremarkable. Vascular calcifications noted. No stone or mass lesion is present. No obstruction present. Ureters are within normal limits bilaterally. The urinary bladder is normal. Stomach/Bowel: The stomach and duodenum are within normal limits. Small bowel is unremarkable. Terminal ileum is within normal limits. The appendix is visualized and normal. The ascending and transverse  colon are within normal limits. Descending colon is unremarkable. Diverticular changes are present in the sigmoid colon without inflammation. Moderate stool is present at the rectum. Transverse diameter is 7.8 cm. Vascular/Lymphatic: Dense atherosclerotic calcifications are present at the aorta without aneurysm. Dense calcifications are present at the origins of the celiac artery and superior mesenteric artery. Dense calcifications are present at the origins of both renal arteries. No significant adenopathy is present. Reproductive: Prostate is unremarkable. Other: No abdominal wall hernia or abnormality. No abdominopelvic ascites. Musculoskeletal: Rightward curvature is present in the lumbar spine L2 superior endplate remote compression fracture noted. Vacuum disc in seen at L5-S1 with slight degenerative anterolisthesis. Central canal stenosis is greatest at L3-4 and L4-5. No focal osseous lesions are present. Bony pelvis is normal. Left hip is unremarkable. Intramedullary densities are present on the right. Question remote osteomyelitis. IMPRESSION: 1. Cholecystostomy tube in place with decompression of the gallbladder. 2. Layering stones are present in the gallbladder. Mild wall thickening is present. No new fluid collections are present. No evidence for complication. 3. Moderate stool at the rectum without obstruction. 4. Sigmoid diverticulosis without diverticulitis. 5. Dense atherosclerotic disease including dense calcifications at the origins of the celiac artery and superior mesenteric artery and renal arteries. 6. Remote L2 superior endplate compression fracture. 7. Central canal stenosis is greatest at L3-4 and L4-5. 8. Intramedullary densities on the right. Question remote osteomyelitis. 9. Bibasilar dependent airspace opacities are present. 10. Aortic Atherosclerosis (ICD10-I70.0). Electronically Signed   By: San Morelle M.D.   On: 01/15/2022 16:20   DG Chest Port 1 View  Result Date:  01/15/2022 CLINICAL DATA:  Questionable sepsis.  Failure to thrive and fever. EXAM: PORTABLE CHEST 1 VIEW COMPARISON:  01/03/2022 and older exams. FINDINGS: Cardiac silhouette is normal in size. No mediastinal or hilar masses. Clear lungs.  No pleural effusion or pneumothorax. Skeletal structures are grossly intact. IMPRESSION: No active disease. Electronically Signed   By: Lajean Manes M.D.   On: 01/15/2022 15:49    Assessment/Plan Principal  Problem:   Acute encephalopathy   #1 sepsis patient admitted with confusion change in mental status hypotension tachycardic with fever and lactic acidosis with endorgan damage such as acute encephalopathy Sepsis could be from multiple sources including acute acalculous cholecystitis IV fluids Broad-spectrum IV antibiotics Follow blood cultures  #2 acute encephalopathy   RN Pressure Injury Documentation: Pressure Injury 12/18/21 Foot Anterior;Left;Lateral Deep Tissue Pressure Injury - Purple or maroon localized area of discolored intact skin or blood-filled blister due to damage of underlying soft tissue from pressure and/or shear. purple, red (Active)  12/18/21 1530  Location: Foot  Location Orientation: Anterior;Left;Lateral  Staging: Deep Tissue Pressure Injury - Purple or maroon localized area of discolored intact skin or blood-filled blister due to damage of underlying soft tissue from pressure and/or shear.  Wound Description (Comments): purple, red  Present on Admission: Yes  Dressing Type Foam - Lift dressing to assess site every shift 01/04/22 2244     Pressure Injury 01/03/22 Toe (Comment  which one) Anterior;Left Unstageable - Full thickness tissue loss in which the base of the injury is covered by slough (yellow, tan, gray, green or brown) and/or eschar (tan, brown or black) in the wound bed. (Active)  01/03/22 1000  Location: Toe (Comment  which one)  Location Orientation: Anterior;Left  Staging: Unstageable - Full thickness tissue loss  in which the base of the injury is covered by slough (yellow, tan, gray, green or brown) and/or eschar (tan, brown or black) in the wound bed.  Wound Description (Comments):   Present on Admission:   Dressing Type Foam - Lift dressing to assess site every shift 01/04/22 2244     Pressure Injury 01/03/22 Sacrum Mid Unstageable - Full thickness tissue loss in which the base of the injury is covered by slough (yellow, tan, gray, green or brown) and/or eschar (tan, brown or black) in the wound bed. (Active)  01/03/22 1000  Location: Sacrum  Location Orientation: Mid  Staging: Unstageable - Full thickness tissue loss in which the base of the injury is covered by slough (yellow, tan, gray, green or brown) and/or eschar (tan, brown or black) in the wound bed.  Wound Description (Comments):   Present on Admission:   Dressing Type Foam - Lift dressing to assess site every shift 01/04/22 2244     Pressure Injury 01/03/22 Coccyx Mid Unstageable - Full thickness tissue loss in which the base of the injury is covered by slough (yellow, tan, gray, green or brown) and/or eschar (tan, brown or black) in the wound bed. (Active)  01/03/22 1000  Location: Coccyx  Location Orientation: Mid  Staging: Unstageable - Full thickness tissue loss in which the base of the injury is covered by slough (yellow, tan, gray, green or brown) and/or eschar (tan, brown or black) in the wound bed.  Wound Description (Comments):   Present on Admission:   Dressing Type Foam - Lift dressing to assess site every shift 01/04/22 2244     Pressure Injury 01/03/22 Heel Left Deep Tissue Pressure Injury - Purple or maroon localized area of discolored intact skin or blood-filled blister due to damage of underlying soft tissue from pressure and/or shear. (Active)  01/03/22 1000  Location: Heel  Location Orientation: Left  Staging: Deep Tissue Pressure Injury - Purple or maroon localized area of discolored intact skin or blood-filled  blister due to damage of underlying soft tissue from pressure and/or shear.  Wound Description (Comments):   Present on Admission:   Dressing Type Foam - Lift  dressing to assess site every shift 01/04/22 2244     Pressure Injury 01/03/22 Ankle Anterior;Left Deep Tissue Pressure Injury - Purple or maroon localized area of discolored intact skin or blood-filled blister due to damage of underlying soft tissue from pressure and/or shear. (Active)  01/03/22 1000  Location: Ankle  Location Orientation: Anterior;Left  Staging: Deep Tissue Pressure Injury - Purple or maroon localized area of discolored intact skin or blood-filled blister due to damage of underlying soft tissue from pressure and/or shear.  Wound Description (Comments):   Present on Admission:   Dressing Type Foam - Lift dressing to assess site every shift 01/04/22 2244    Malnutrition Type:      Malnutrition Characteristics:      Nutrition Interventions:     Estimated body mass index is 30.38 kg/m as calculated from the following:   Height as of this encounter: 6' (1.829 m).   Weight as of this encounter: 101.6 kg.   DVT prophylaxis: ***  Code Status: ***  Family Communication: ***  Disposition Plan: ***  Consults called: ***  Admission status: ***    Georgette Shell MD [facility]  If 7PM-7AM, please contact night-coverage [email or web address] Password [facility]  01/15/2022, 6:16 PM

## 2022-01-15 NOTE — ED Provider Notes (Addendum)
Patient signed out to me by previous provider. Please refer to their note for full HPI.  Briefly this is a 70 year old male with recent admission for metabolic encephalopathy, cholecystitis treated percutaneous cholecystostomy tube, acute anemia (hemolytic and GI bleed) presented to the emergency department altered mental status.  Work-up thus far is concerning for worsening renal dysfunction, possible uremic encephalopathy, acute anemia as well as hyperammonemia.  Chest x-ray shows no acute finding, flu and COVID swab was negative.  Patient initially hypotensive, but fluid responsive, lactic slightly increased from 2.0-2.6. Physical Exam  BP (!) 92/45   Pulse (!) 103   Temp 99.6 F (37.6 C) (Rectal)   Resp 14   Ht 6' (1.829 m)   Wt 101.6 kg Comment: per record  SpO2 98%   BMI 30.38 kg/m   Physical Exam Vitals and nursing note reviewed.  Constitutional:      Appearance: Normal appearance. He is ill-appearing.  HENT:     Head: Normocephalic.     Mouth/Throat:     Mouth: Mucous membranes are moist.  Cardiovascular:     Rate and Rhythm: Tachycardia present.  Abdominal:     Palpations: Abdomen is soft.  Genitourinary:    Comments: Rectal shows light brown/yellow stool Musculoskeletal:     Comments: Right BKA  Skin:    Coloration: Skin is pale.     Comments: Chronic left lower extremity wounds that are dressed  Neurological:     Mental Status: He is alert. He is disoriented.     Procedures  Ultrasound ED Peripheral IV (Provider)  Date/Time: 01/15/2022 5:58 PM  Performed by: Lorelle Gibbs, DO Authorized by: Lorelle Gibbs, DO   Procedure details:    Indications: multiple failed IV attempts and poor IV access     Skin Prep: isopropyl alcohol     Location:  Right AC   Angiocath:  20 G   Bedside Ultrasound Guided: Yes     Images: not archived     Patient tolerated procedure without complications: Yes     Dressing applied: Yes   .Critical Care  Performed by: Lorelle Gibbs, DO Authorized by: Lorelle Gibbs, DO   Critical care provider statement:    Critical care time (minutes):  30   Critical care time was exclusive of:  Separately billable procedures and treating other patients   Critical care was necessary to treat or prevent imminent or life-threatening deterioration of the following conditions:  Sepsis and shock   Critical care was time spent personally by me on the following activities:  Development of treatment plan with patient or surrogate, discussions with consultants, evaluation of patient's response to treatment, examination of patient, ordering and review of laboratory studies, ordering and review of radiographic studies, ordering and performing treatments and interventions, pulse oximetry, re-evaluation of patient's condition and review of old charts   I assumed direction of critical care for this patient from another provider in my specialty: yes     Care discussed with: admitting provider     ED Course / MDM    Medical Decision Making Amount and/or Complexity of Data Reviewed Labs: ordered. Radiology: ordered. ECG/medicine tests: ordered.  Risk Prescription drug management. Decision regarding hospitalization.   Patient CT the abdomen pelvis shows chronic findings.  Patient is fecal occult negative.  Patient required ultrasound-guided IV in the right upper extremity.  We will plan for blood transfusion however the patient has reported antibodies, blood transfusion delayed.  Patient received IV hydration per septic  protocol, antibiotics given per unknown source.  He continues to be disoriented.  Patient is reportedly on lactulose and presumed to be compliant in regards to the elevated ammonia.  Patient will require admission.  Patients evaluation and results requires admission for further treatment and care. Family agrees with admission plan.       Lorelle Gibbs, DO 01/15/22 1758    Lorelle Gibbs, DO 01/15/22  1858

## 2022-01-15 NOTE — Progress Notes (Signed)
A consult was received from an ED physician for cefepime and vancomycin per pharmacy dosing.  The patient's profile has been reviewed for ht/wt/allergies/indication/available labs.    Agree with cefepime 2 g IV x 1 entered by provider. A one time order has been placed for vancomycin 2 g IV.  Further antibiotics/pharmacy consults should be ordered by admitting physician if indicated.                       Royetta Asal, PharmD, BCPS Clinical Pharmacist [facility] Please utilize Amion for appropriate phone number to reach the unit pharmacist ([place]) 01/15/2022 2:28 PM

## 2022-01-15 NOTE — Progress Notes (Signed)
[facility] Progress Note Patient Name: Wyman Meschke Ickes [name]. DOB: Apr 09, 1951 MRN: [id number]   Date of Service  01/15/2022  HPI/Events of Note  71 yr old debilitated male with cirrhosis and AKA on right presented from rehab with hypotension and altered mental status.  He was hospitalized in early June for sepsis.  Found to have hgb 5.8 without obvious signs of bleeding, AKI, elevated lactate.  He had partial response to fluids.  Presumed to be septic with foot as possible source.    eICU Interventions  Chart reviewed. Agree with plan. Will follow closely     Intervention Category Evaluation Type: New Patient Evaluation  Mauri Brooklyn, P 01/15/2022, 8:14 PM

## 2022-01-15 NOTE — H&P (Signed)
NAME:  Jayton, Popelka MRN:  [id number], DOB:  Nov 24, 1950, LOS: 0 ADMISSION DATE:  01/15/2022, CONSULTATION DATE:  01/15/22 REFERRING MD:  Adele Schilder, CHIEF COMPLAINT:  sepsis   History of Present Illness:  Mr. Rosiak is a 71 year old gentleman with a history of hepatitis C, cirrhosis with history hepatic encephalopathy, phantom limb syndrome and chronic oxycodone use who presented with confusion, low blood pressure, abdominal pain from [facility] care where he has been  in rehabilitation since his most recent hospitalization.  He was discharged on 01/05/19 after admission for septic shock due to acute cholecystitis requiring percutaneous biliary drain, acute blood loss anemia due to hemolytic anemia and GI bleed, left thigh rhabdomyolysis and myonecrosis, AKI.  He has been on IM ceftriaxone since discharge.  Last dose today.  His brother reported that he was having diffuse pain yesterday but could not localize.  Today he was more confused.  He has been eating and drinking well until yesterday.  He presented to the ED hypotensive with a blood pressure 75/43.  He has partially responded to IV fluids, but MAP remains below 65.  His brother has provided additional history.  His main complaint is pain.  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 rate 14, height 6' (1.829 m), weight 101.6 kg, SpO2 98 %.       No intake or output data in the 24 hours ending 01/15/22 1858 Filed Weights   01/15/22 1427  Weight: 101.6 kg     Examination: General: Chronically ill-appearing man lying in bed in no acute distress, confused HENT: [place]/AT, eyes anicteric Lungs: Breathing comfortably on nasal cannula, mild tachypnea, no rales or wheezing Cardiovascular: S1-S2, mildly tachycardic, regular rhythm Abdomen: Obese, soft, tender diffusely Extremities: Right above-the-knee amputation.  Left foot wound bandaged, previous left fifth toe amputation, fourth toe completely cyanotic he had Neuro: Confused, falling asleep mid conversation.  Globally weak GU: Amber urine, Foley  Lactic acid 2>2.6 WBC 4.2 H/H 5.8/18.1 BUN 100 Creatinine 2.9 INR 1.4  UA: leukocytes negative COVID and flu negative CT abdomen pelvis personally reviewed-minimal lung infiltrates, not lobar consolidations.  Decompressed gallbladder, few gallstones, PERC Coley cystostomy drain in appropriate position.  No ascites.  Resolved Hospital Problem list     Assessment & Plan:  Septic shock due to unknown source.  Percutaneous biliary drain is in appropriate position gallbladder was decompressed so this seems less likely.  Worry about osteomyelitis in his foot or possible bacteremia - WOC consult for left foot.  Will need vascular surgery consult tomorrow. - Foot CT - Cefepime, vancomycin - Resuscitated with IV fluids - Vasopressors as required to maintain MAP greater than 65 - Follow blood cultures, urine culture -Culture gallbladder fluid  Acute metabolic encephalopathy, BUN greater than 100.  Has a history of hepatic encephalopathy. - Check ammonia level - Volume resuscitation - Maintain adequate perfusion, goal MAP greater than 65 - Avoid sedating meds -May need NG tube for lactulose  Acute on chronic anemia- hemoglobin ~9 on 6/13 -Check DIC panel - 2 units ordered to be transfused; will be difficult to crossmatch due  to antibodies from previous transfusions - May need hematology consult tomorrow for history of hemolytic  anemia -Steroids -Empiric high-dose PPI, although FOBT negative -Monitor for potential source of bleeding  Lactic acidosis - Recheck - Additional volume resuscitation with albumin - Maintain MAP greater than 65 - Empiric antibiotic  Acute on chronic thrombocytopenia - Does not currently meet transfusion threshold, but if actively bleeding would want platelets greater than 50  AKI, prerenal versus ATN from AKI - Hold ARB - MAP goal greater than 65 -Strict I's/O - Renally dose meds and avoid nephrotoxic meds - Keep Foley - Monitor  Decompensated cirrhosis potentially due to acute infection - Lactulose - Check ammonia - Supportive care - Monitor LFTs  Hyperglycemia - SSI as needed -Goal blood glucose less than 180  Abdominal pain-no clear acute cause to explain this - Continue to monitor - Minimize potentially sedating pain meds due to encephalopathy -Continue monitoring output from cholecystostomy drain.  Acute respiratory failure with hypoxia - Supplemental oxygen as required to maintain SPO2 greater than 90%   Spoke to Mr. Theard son [name] and his brother in the ED.  He would want resuscitation in the event of decompensation.  Full code.  They understand he is likely to have a rough course and may not recover to his previous baseline.  Mr. Hilmer is unfortunately too confused to have a meaningful say in this conversation.  Best Practice (right click and "Reselect all SmartList Selections" daily)   Diet/type: NPO DVT prophylaxis: SCD GI prophylaxis: PPI Lines: N/A Foley:  Yes, and it is still needed Code Status:  full code Last date of multidisciplinary goals of care discussion [6/19 with son, brother, patient]  Labs   CBC: Recent Labs  Lab 01/15/22 1416  WBC 4.2  NEUTROABS PENDING  HGB 5.8*  HCT 18.1*  MCV 98.9  PLT 35*    Basic Metabolic Panel: Recent Labs  Lab 01/15/22 1416  NA 138  K 3.3*  CL 101  CO2 24  GLUCOSE 154*  BUN 100*   CREATININE 2.09*  CALCIUM 8.0*   GFR: Estimated Creatinine Clearance: 39.4 mL/min (A) (by C-G formula based on SCr of 2.09 mg/dL (H)). Recent Labs  Lab 01/15/22 1416 01/15/22 1637  WBC 4.2  --   LATICACIDVEN 2.0* 2.6*    Liver Function Tests: Recent Labs  Lab 01/15/22 1416  AST 58*  ALT 37  ALKPHOS 115  BILITOT 2.9*  PROT 5.5*  ALBUMIN 2.3*   No results for input(s): "LIPASE", "AMYLASE" in the last 168 hours. Recent Labs  Lab 01/15/22 1416  AMMONIA 64*    ABG    Component Value Date/Time   HCO3 23.5 12/18/2021 2207   O2SAT 80.8 12/18/2021 2207     Coagulation Profile: Recent Labs  Lab 01/15/22 1416  INR 1.4*    Cardiac Enzymes: No results for input(s): "CKTOTAL", "CKMB", "CKMBINDEX", "TROPONINI" in the last 168 hours.  HbA1C: Hgb A1c MFr Bld  Date/Time Value Ref Range Status  12/20/2021 08:01 AM 4.9 4.8 - 5.6 % Final    Comment:    (NOTE) Pre diabetes:          5.7%-6.4%  Diabetes:              >6.4%  Glycemic control for   <7.0% adults with diabetes   11/13/2019 11:18 AM 3.4 (L) 4.6 - 6.5 % Final    Comment:    Glycemic Control Guidelines for People with Diabetes:Non Diabetic:  <6%Goal of Therapy: <7%Additional Action  Suggested:  >8%     CBG: Recent Labs  Lab 01/15/22 1408  GLUCAP 154*    Review of Systems:   Limited due to patient's encephalopathy-positive for abdominal pain, confusion, subjective fever, malaise, poor p.o. 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   Surgical History:   Past Surgical History:   Procedure Laterality Date   ABOVE KNEE LEG AMPUTATION Right    Right femur fracture, 11/1994, resulted in right AKA 2006 after staph infection   COLONOSCOPY N/A 12/16/2020   Procedure: COLONOSCOPY;  Surgeon: Virgel Manifold, MD;  Location: [facility] ENDOSCOPY;  Service: Endoscopy;  Laterality: N/A;   ESOPHAGOGASTRODUODENOSCOPY N/A 12/16/2020   Procedure: ESOPHAGOGASTRODUODENOSCOPY (EGD);  Surgeon: Virgel Manifold, MD;  [facility] ENDOSCOPY;  Service: Endoscopy;  Laterality: N/A;   IR PERC CHOLECYSTOSTOMY  12/23/2021     Social History:   reports that he has never smoked. He has never used smokeless tobacco. He reports that he does not currently use alcohol. He reports that he does not use drugs.   Family History:  His family history includes Heart attack in his paternal grandfather and paternal grandmother; Heart disease in his mother; Lung cancer (age of onset: 67) in his mother. There is no history of Liver cancer.   Allergies Allergies  Allergen Reactions   Clindamycin Dermatitis   Unasyn [Ampicillin-Sulbactam Sodium] Rash    Diffuse whole body rash 12/28/21     Home Medications  Prior to Admission medications   Medication Sig Start Date End Date Taking? Authorizing Provider  ALPRAZolam Duanne Moron) 1 MG tablet Take 1 tablet (1 mg total) by mouth 3 (three) times daily as needed for anxiety. 01/04/22  Yes Gherghe, Vella Redhead, MD  Cholecalciferol (VITAMIN D3) 10 MCG (400 UNIT) CAPS Take 4 capsules by mouth daily.   Yes [provider]  collagenase (SANTYL) 250 UNIT/GM ointment Apply 1 Application topically daily. Apply to L Lateral lower leg topically every day.   Yes [provider]  fentaNYL (DURAGESIC) 25 MCG/HR Place 1 patch onto the skin every 3 (three) days. 01/04/22  Yes Gherghe, Vella Redhead, MD  folic acid (FOLVITE) 1 MG tablet TAKE 1 TABLET(1 MG) BY MOUTH DAILY Patient taking differently: Take 1 mg by mouth daily. 10/12/21  Yes Jacquelin Hawking, NP  furosemide  (LASIX) 80 MG tablet Take 1 tablet (80 mg total) by mouth daily. 01/04/22 01/04/23 Yes Gherghe, Vella Redhead, MD  lactulose (CHRONULAC) 10 GM/15ML solution Take 30 mLs (20 g total) by mouth daily. 01/04/22  Yes Gherghe, Vella Redhead, MD  levothyroxine (SYNTHROID) 50 MCG tablet TAKE 1 TABLET(50 MCG) BY MOUTH DAILY BEFORE BREAKFAST Patient taking differently: Take 50 mcg by mouth daily before breakfast. 12/14/21  Yes Arnett, [name], FNP  losartan (COZAAR) 100 MG tablet TAKE 1 TABLET(100 MG) BY MOUTH DAILY Patient taking differently: Take 100 mg by mouth daily. 11/14/21  Yes Arnett, [name], FNP  lubiprostone (AMITIZA) 24 MCG capsule Take 24 mcg by mouth daily as needed for constipation. 08/13/14  Yes [provider]  Multiple Vitamin (MULTIVITAMIN WITH MINERALS) TABS tablet Take 1 tablet by mouth daily. 01/05/22  Yes Caren Griffins, MD  NARCAN 4 MG/0.1ML LIQD nasal spray  kit Place 1 spray into the nose once. 12/29/18  Yes [provider]  oxyCODONE 10 MG TABS Take 2.5 tablets (25 mg total) by mouth every 4 (four) hours as needed (moderate pain). Patient taking differently: Take 25 mg by mouth every 4 (four) hours as needed (Severe pain). 01/04/22  Yes Gherghe, Vella Redhead, MD  pantoprazole (PROTONIX) 40 MG tablet Take 1 tablet (40 mg total) by mouth daily. 12/18/20 01/15/22 Yes Sharen Hones, MD  QUEtiapine (SEROQUEL) 25 MG tablet Take 1 tablet (25 mg total) by mouth at bedtime. 01/04/22  Yes Gherghe, Vella Redhead, MD  vitamin B-12 (CYANOCOBALAMIN) 1000 MCG tablet Take 1 tablet (1,000 mcg total) by mouth daily. 04/09/18  Yes Bettey Costa, MD  acetaminophen (TYLENOL) 325 MG tablet Take 2 tablets (650 mg total) by mouth every 6 (six) hours as needed for fever or mild pain. Patient not taking: Reported on 01/15/2022 01/04/22   Caren Griffins, MD     Critical care time: 45 min.     Julian Hy, DO 01/15/22 7:13 PM [facility]

## 2022-01-15 NOTE — ED Provider Notes (Signed)
[facility] [facility] DEPT Provider Note   CSN: [id number] Arrival date & time: 01/15/22  1353     History  Chief Complaint  Patient presents with   Altered Mental Status   Fever   Failure To Thrive   Hypotension    [name]. is a 71 y.o. male.  71 yo M with a chief complaints of altered mental status.  This is been going on for a couple days.  Not eating and drinking having fevers.  Found to be hypotensive with EMS and brought here for evaluation.  Patient is altered and unable to provide any history.  Level 5 caveat.   Altered Mental Status Associated symptoms: fever   Fever      Home Medications Prior to Admission medications   Medication Sig Start Date End Date Taking? Authorizing Provider  acetaminophen (TYLENOL) 325 MG tablet Take 2 tablets (650 mg total) by mouth every 6 (six) hours as needed for fever or mild pain. 01/04/22   Caren Griffins, MD  ALPRAZolam Duanne Moron) 1 MG tablet Take 1 tablet (1 mg total) by mouth 3 (three) times daily as needed for anxiety. 01/04/22   Caren Griffins, MD  Cholecalciferol (VITAMIN D3) 10 MCG (400 UNIT) CAPS Take 4 capsules by mouth daily.    [provider]  fentaNYL (DURAGESIC) 25 MCG/HR Place 1 patch onto the skin every 3 (three) days. 01/04/22   Caren Griffins, MD  folic acid (FOLVITE) 1 MG tablet TAKE 1 TABLET(1 MG) BY MOUTH DAILY Patient taking differently: Take 1 mg by mouth daily. 10/12/21   Jacquelin Hawking, NP  furosemide (LASIX) 80 MG tablet Take 1 tablet (80 mg total) by mouth daily. 01/04/22 01/04/23  Caren Griffins, MD  lactulose (CHRONULAC) 10 GM/15ML solution Take 30 mLs (20 g total) by mouth daily. 01/04/22   Caren Griffins, MD  levothyroxine (SYNTHROID) 50 MCG tablet TAKE 1 TABLET(50 MCG) BY MOUTH DAILY BEFORE BREAKFAST Patient taking differently: Take 50 mcg by mouth daily before breakfast. 12/14/21   [name], FNP  losartan (COZAAR) 100 MG tablet TAKE 1 TABLET(100  MG) BY MOUTH DAILY Patient taking differently: Take 100 mg by mouth daily. 11/14/21   [name], FNP  lubiprostone (AMITIZA) 24 MCG capsule Take 24 mcg by mouth daily as needed. 08/13/14   [provider]  Multiple Vitamin (MULTIVITAMIN WITH MINERALS) TABS tablet Take 1 tablet by mouth daily. 01/05/22   Caren Griffins, MD  NARCAN 4 MG/0.1ML LIQD nasal spray kit Place 1 spray into the nose once. 12/29/18   [provider]  oxyCODONE 10 MG TABS Take 2.5 tablets (25 mg total) by mouth every 4 (four) hours as needed (moderate pain). 01/04/22   Caren Griffins, MD  pantoprazole (PROTONIX) 40 MG tablet Take 1 tablet (40 mg total) by mouth daily. 12/18/20 12/18/21  Sharen Hones, MD  QUEtiapine (SEROQUEL) 25 MG tablet Take 1 tablet (25 mg total) by mouth at bedtime. 01/04/22   Caren Griffins, MD  vitamin B-12 (CYANOCOBALAMIN) 1000 MCG tablet Take 1 tablet (1,000 mcg total) by mouth daily. 04/09/18   Bettey Costa, MD      Allergies    Clindamycin and Unasyn [ampicillin-sulbactam sodium]    Review of Systems   Review of Systems  Constitutional:  Positive for fever.    Physical Exam Updated Vital Signs BP (!) 96/45   Pulse (!) 102   Temp 99.6 F (37.6 C) (Rectal)  Resp 17   Ht 6' (1.829 m)   Wt 101.6 kg Comment: per record  SpO2 100%   BMI 30.38 kg/m  Physical Exam Vitals and nursing note reviewed.  Constitutional:      Appearance: He is well-developed. He is ill-appearing.  HENT:     Head: Normocephalic and atraumatic.  Eyes:     Pupils: Pupils are equal, round, and reactive to light.  Neck:     Vascular: No JVD.  Cardiovascular:     Rate and Rhythm: Regular rhythm. Tachycardia present.     Heart sounds: No murmur heard.    No friction rub. No gallop.  Pulmonary:     Effort: No respiratory distress.     Breath sounds: No wheezing.  Abdominal:     General: There is no distension.     Tenderness: There is abdominal tenderness. There is no guarding or  rebound.     Comments: Diffuse abdominal pain on exam  Musculoskeletal:        General: Normal range of motion.     Cervical back: Normal range of motion and neck supple.     Comments: Right AKA.  Left lateral tibial and foot wound.  No obvious erythema or warmth or drainage.  Skin:    Coloration: Skin is not pale.     Findings: No rash.  Neurological:     Mental Status: He is alert and oriented to person, place, and time.  Psychiatric:        Behavior: Behavior normal.     ED Results / Procedures / Treatments   Labs (all labs ordered are listed, but only abnormal results are displayed) Labs Reviewed  LACTIC ACID, PLASMA - Abnormal; Notable for the following components:      Result Value   Lactic Acid, Venous 2.0 (*)    All other components within normal limits  COMPREHENSIVE METABOLIC PANEL - Abnormal; Notable for the following components:   Potassium 3.3 (*)    Glucose, Bld 154 (*)    BUN 100 (*)    Creatinine, Ser 2.09 (*)    Calcium 8.0 (*)    Total Protein 5.5 (*)    Albumin 2.3 (*)    AST 58 (*)    Total Bilirubin 2.9 (*)    GFR, Estimated 33 (*)    All other components within normal limits  CBC WITH DIFFERENTIAL/PLATELET - Abnormal; Notable for the following components:   RBC 1.83 (*)    Hemoglobin 5.8 (*)    HCT 18.1 (*)    RDW 22.3 (*)    All other components within normal limits  PROTIME-INR - Abnormal; Notable for the following components:   Prothrombin Time 17.1 (*)    INR 1.4 (*)    All other components within normal limits  AMMONIA - Abnormal; Notable for the following components:   Ammonia 64 (*)    All other components within normal limits  CBG MONITORING, ED - Abnormal; Notable for the following components:   Glucose-Capillary 154 (*)    All other components within normal limits  RESP PANEL BY RT-PCR (FLU A&B, COVID) ARPGX2  CULTURE, BLOOD (ROUTINE X 2)  CULTURE, BLOOD (ROUTINE X 2)  URINE CULTURE  APTT  LACTIC ACID, PLASMA  URINALYSIS,  ROUTINE W REFLEX MICROSCOPIC  I-STAT CHEM 8, ED  TYPE AND SCREEN  PREPARE RBC (CROSSMATCH)    EKG EKG Interpretation  Date/Time:  Monday January 15 2022 14:14:19 EDT Ventricular Rate:  100 PR Interval:  177 QRS  Duration: 99 QT Interval:  387 QTC Calculation: 500 R Axis:   -23 Text Interpretation: Sinus tachycardia Borderline left axis deviation Low voltage, precordial leads Consider anterior infarct No significant change since last tracing Confirmed by Deno Etienne [phone]) on 01/15/2022 2:16:21 PM  Radiology No results found.  Procedures Procedures    Medications Ordered in ED Medications  lactated ringers infusion (has no administration in time range)  lactated ringers bolus 1,000 mL (0 mLs Intravenous Stopped 01/15/22 1529)    And  lactated ringers bolus 1,000 mL (1,000 mLs Intravenous New Bag/Given 01/15/22 1504)    And  lactated ringers bolus 1,000 mL (has no administration in time range)    And  lactated ringers bolus 500 mL (has no administration in time range)  vancomycin (VANCOREADY) IVPB 2000 mg/400 mL (2,000 mg Intravenous New Bag/Given 01/15/22 1531)  0.9 %  sodium chloride infusion (has no administration in time range)  ceFEPIme (MAXIPIME) 2 g in sodium chloride 0.9 % 100 mL IVPB (0 g Intravenous Stopped 01/15/22 1528)  metroNIDAZOLE (FLAGYL) IVPB 500 mg (0 mg Intravenous Stopped 01/15/22 1529)    ED Course/ Medical Decision Making/ A&P                           Medical Decision Making Amount and/or Complexity of Data Reviewed Labs: ordered. Radiology: ordered. ECG/medicine tests: ordered.  Risk Prescription drug management.   71 yo M with a chief complaints of altered mental status fever and failure to thrive.  Going on for couple days.  Has a history of cirrhosis was recently hospitalized for similar sounding presentation.  Will activate code sepsis.  Fluids antibiotics reassess.  AKI, uremia.  Acute anemia.  Will transfuse 2 units.  Awaiting CT.  Signed out  to Dr. Dina Rich.  CRITICAL CARE Performed by: Cecilio Asper   Total critical care time: 35 minutes  Critical care time was exclusive of separately billable procedures and treating other patients.  Critical care was necessary to treat or prevent imminent or life-threatening deterioration.  Critical care was time spent personally by me on the following activities: development of treatment plan with patient and/or surrogate as well as nursing, discussions with consultants, evaluation of patient's response to treatment, examination of patient, obtaining history from patient or surrogate, ordering and performing treatments and interventions, ordering and review of laboratory studies, ordering and review of radiographic studies, pulse oximetry and re-evaluation of patient's condition.  The patients results and plan were reviewed and discussed.   Any x-rays performed were independently reviewed by myself.   Differential diagnosis were considered with the presenting HPI.  Medications  lactated ringers infusion (has no administration in time range)  lactated ringers bolus 1,000 mL (0 mLs Intravenous Stopped 01/15/22 1529)    And  lactated ringers bolus 1,000 mL (1,000 mLs Intravenous New Bag/Given 01/15/22 1504)    And  lactated ringers bolus 1,000 mL (has no administration in time range)    And  lactated ringers bolus 500 mL (has no administration in time range)  vancomycin (VANCOREADY) IVPB 2000 mg/400 mL (2,000 mg Intravenous New Bag/Given 01/15/22 1531)  0.9 %  sodium chloride infusion (has no administration in time range)  ceFEPIme (MAXIPIME) 2 g in sodium chloride 0.9 % 100 mL IVPB (0 g Intravenous Stopped 01/15/22 1528)  metroNIDAZOLE (FLAGYL) IVPB 500 mg (0 mg Intravenous Stopped 01/15/22 1529)    Vitals:   01/15/22 1406 01/15/22 1427 01/15/22 1500 01/15/22 1520  BP: Marland Kitchen)  75/43  (!) 100/47 (!) 96/45  Pulse: (!) 102  100 (!) 102  Resp: _0 Temp: 99.6 F (37.6 C)      TempSrc: Rectal     SpO2: 93%  97% 100%  Weight:  101.6 kg    Height:  6' (1.829 m)      Final diagnoses:  Transient alteration of awareness  Acute renal failure, unspecified acute renal failure type (HCC)  Symptomatic anemia    Admission/ observation were discussed with the admitting physician, patient and/or family and they are comfortable with the plan.         Final Clinical Impression(s) / ED Diagnoses Final diagnoses:  Transient alteration of awareness  Acute renal failure, unspecified acute renal failure type (HCC)  Symptomatic anemia    Rx / DC Orders ED Discharge Orders     None         Deno Etienne, DO 01/15/22 1546

## 2022-01-15 NOTE — Progress Notes (Signed)
An USGPIV (ultrasound guided PIV) has been placed for short-term vasopressor infusion. A correctly placed ivWatch must be used when administering Vasopressors. Should this treatment be needed beyond 72 hours, central line access should be obtained.  It will be the responsibility of the bedside nurse to follow best practice to prevent extravasations.   ?

## 2022-01-15 NOTE — ED Notes (Signed)
This RN received call from blood bank stating pt is positive for antibodies so have to order the blood and will be a delay in it being ready . This RN notified EDP [place]. Will continue to monitor.

## 2022-01-15 NOTE — ED Notes (Signed)
Per MD Tyrone Nine does not want a repeat I Stat Chem 8

## 2022-01-15 NOTE — Progress Notes (Signed)
Pharmacy Antibiotic Note  [name] Kathol [name]. is a 71 y.o. male admitted on 01/15/2022 with sepsis.  Pharmacy has been consulted for vancomycin and cefepime dosing.  Plan: Vancomycin 2000 mg IV now, then 1000 mg IV q24 hr (est AUC 450 based on SCr 2.09; Vd 0.65 - borderline BMI) Measure vancomycin AUC at steady state as indicated AKI (baseline SCr < 1.0) >> SCr daily while on vanc   Height: 6' (182.9 cm) Weight: 101.6 kg (224 lb) (per record) IBW/kg (Calculated) : 77.6  Temp (24hrs), Avg:99.6 F (37.6 C), Min:99.6 F (37.6 C), Max:99.6 F (37.6 C)  Recent Labs  Lab 01/15/22 1416 01/15/22 1637  WBC 4.2  --   CREATININE 2.09*  --   LATICACIDVEN 2.0* 2.6*    Estimated Creatinine Clearance: 39.4 mL/min (A) (by C-G formula based on SCr of 2.09 mg/dL (H)).    Allergies  Allergen Reactions   Clindamycin Dermatitis   Unasyn [Ampicillin-Sulbactam Sodium] Rash    Diffuse whole body rash 12/28/21 Tolerated cefepime 01/15/22    Antimicrobials this admission: 6/19 vancomycin >>  6/19 cefepime >>  6/19 Flagyl x 1  Dose adjustments this admission: N/a  Microbiology results: 6/19 BCx: sent 6/19 UCx: sent   Thank you for allowing pharmacy to be a part of this patient's care.  Zykia Walla A 01/15/2022 7:23 PM

## 2022-01-15 NOTE — Sepsis Progress Note (Signed)
Notified provider of need to order repeat lactic acid. ° °

## 2022-01-16 ENCOUNTER — Inpatient Hospital Stay (HOSPITAL_COMMUNITY): Payer: Medicare Other

## 2022-01-16 ENCOUNTER — Other Ambulatory Visit: Payer: Self-pay

## 2022-01-16 ENCOUNTER — Inpatient Hospital Stay: Payer: Self-pay

## 2022-01-16 ENCOUNTER — Encounter (HOSPITAL_COMMUNITY): Payer: Self-pay | Admitting: Critical Care Medicine

## 2022-01-16 DIAGNOSIS — I9589 Other hypotension: Secondary | ICD-10-CM

## 2022-01-16 DIAGNOSIS — R6520 Severe sepsis without septic shock: Secondary | ICD-10-CM | POA: Diagnosis not present

## 2022-01-16 DIAGNOSIS — N179 Acute kidney failure, unspecified: Secondary | ICD-10-CM

## 2022-01-16 LAB — CBC
HCT: 15.9 % — ABNORMAL LOW (ref 39.0–52.0)
HCT: 20.5 % — ABNORMAL LOW (ref 39.0–52.0)
Hemoglobin: 5 g/dL — CL (ref 13.0–17.0)
Hemoglobin: 6.7 g/dL — CL (ref 13.0–17.0)
MCH: 31.6 pg (ref 26.0–34.0)
MCH: 31.9 pg (ref 26.0–34.0)
MCHC: 31.4 g/dL (ref 30.0–36.0)
MCHC: 32.7 g/dL (ref 30.0–36.0)
MCV: 100.6 fL — ABNORMAL HIGH (ref 80.0–100.0)
MCV: 97.6 fL (ref 80.0–100.0)
Platelets: 27 10*3/uL — CL (ref 150–400)
Platelets: 25 10*3/uL — CL (ref 150–400)
RBC: 1.58 MIL/uL — ABNORMAL LOW (ref 4.22–5.81)
RBC: 2.1 MIL/uL — ABNORMAL LOW (ref 4.22–5.81)
RDW: 22.7 % — ABNORMAL HIGH (ref 11.5–15.5)
RDW: 20.5 % — ABNORMAL HIGH (ref 11.5–15.5)
WBC: 2 10*3/uL — ABNORMAL LOW (ref 4.0–10.5)
WBC: 2.2 10*3/uL — ABNORMAL LOW (ref 4.0–10.5)
nRBC: 0 % (ref 0.0–0.2)
nRBC: 0 % (ref 0.0–0.2)

## 2022-01-16 LAB — COMPREHENSIVE METABOLIC PANEL
ALT: 36 U/L (ref 0–44)
AST: 48 U/L — ABNORMAL HIGH (ref 15–41)
Albumin: 2.7 g/dL — ABNORMAL LOW (ref 3.5–5.0)
Alkaline Phosphatase: 95 U/L (ref 38–126)
Anion gap: 13 (ref 5–15)
BUN: 84 mg/dL — ABNORMAL HIGH (ref 8–23)
CO2: 24 mmol/L (ref 22–32)
Calcium: 8.7 mg/dL — ABNORMAL LOW (ref 8.9–10.3)
Chloride: 105 mmol/L (ref 98–111)
Creatinine, Ser: 1 mg/dL (ref 0.61–1.24)
GFR, Estimated: >60 mL/min (ref >60)
Glucose, Bld: 178 mg/dL — ABNORMAL HIGH (ref 70–99)
Potassium: 3.4 mmol/L — ABNORMAL LOW (ref 3.5–5.1)
Sodium: 142 mmol/L (ref 135–145)
Total Bilirubin: 2.3 mg/dL — ABNORMAL HIGH (ref 0.3–1.2)
Total Protein: 5.9 g/dL — ABNORMAL LOW (ref 6.5–8.1)

## 2022-01-16 LAB — URINE CULTURE: Culture: NO GROWTH

## 2022-01-16 LAB — ECHOCARDIOGRAM COMPLETE
AR max vel: 3.78 cm2
AV Peak grad: 8.2 mmHg
Ao pk vel: 1.43 m/s
Area-P 1/2: 5.09 cm2
Height: 72 in
S' Lateral: 3.6 cm
Weight: 3192.26 oz

## 2022-01-16 LAB — GLUCOSE, CAPILLARY
Glucose-Capillary: 154 mg/dL — ABNORMAL HIGH (ref 70–99)
Glucose-Capillary: 155 mg/dL — ABNORMAL HIGH (ref 70–99)
Glucose-Capillary: 165 mg/dL — ABNORMAL HIGH (ref 70–99)
Glucose-Capillary: 203 mg/dL — ABNORMAL HIGH (ref 70–99)
Glucose-Capillary: 209 mg/dL — ABNORMAL HIGH (ref 70–99)
Glucose-Capillary: 242 mg/dL — ABNORMAL HIGH (ref 70–99)

## 2022-01-16 LAB — PROTIME-INR
INR: 1.4 — ABNORMAL HIGH (ref 0.8–1.2)
Prothrombin Time: 17 seconds — ABNORMAL HIGH (ref 11.4–15.2)

## 2022-01-16 LAB — PROCALCITONIN: Procalcitonin: 0.45 ng/mL

## 2022-01-16 LAB — PREPARE RBC (CROSSMATCH)

## 2022-01-16 LAB — MAGNESIUM: Magnesium: 1.6 mg/dL — ABNORMAL LOW (ref 1.7–2.4)

## 2022-01-16 LAB — PHOSPHORUS: Phosphorus: 4.3 mg/dL (ref 2.5–4.6)

## 2022-01-16 MED ORDER — ALPRAZOLAM 0.5 MG PO TABS
0.5000 mg | ORAL_TABLET | Freq: Two times a day (BID) | ORAL | Status: DC | PRN
  Administered 2022-01-16 – 2022-01-17 (×3): 0.5 mg via ORAL
  Filled 2022-01-16 (×3): qty 1

## 2022-01-16 MED ORDER — SODIUM CHLORIDE 0.9% FLUSH
10.0000 mL | INTRAVENOUS | Status: DC | PRN
  Administered 2022-01-20: 20 mL

## 2022-01-16 MED ORDER — MORPHINE SULFATE (PF) 2 MG/ML IV SOLN
2.0000 mg | INTRAVENOUS | Status: DC | PRN
  Administered 2022-01-16 – 2022-01-18 (×7): 2 mg via INTRAVENOUS
  Filled 2022-01-16 (×8): qty 1

## 2022-01-16 MED ORDER — MORPHINE SULFATE (PF) 2 MG/ML IV SOLN
2.0000 mg | Freq: Four times a day (QID) | INTRAVENOUS | Status: DC | PRN
  Administered 2022-01-16 (×3): 2 mg via INTRAVENOUS
  Filled 2022-01-16 (×3): qty 1

## 2022-01-16 MED ORDER — SODIUM CHLORIDE 0.9% IV SOLUTION: Freq: Once | INTRAVENOUS | Status: AC

## 2022-01-16 MED ORDER — MAGNESIUM SULFATE 4 GM/100ML IV SOLN
4.0000 g | Freq: Once | INTRAVENOUS | Status: AC
  Administered 2022-01-16: 4 g via INTRAVENOUS
  Filled 2022-01-16: qty 100

## 2022-01-16 MED ORDER — METRONIDAZOLE 500 MG/100ML IV SOLN
500.0000 mg | Freq: Two times a day (BID) | INTRAVENOUS | Status: DC
  Administered 2022-01-16 – 2022-01-30 (×29): 500 mg via INTRAVENOUS
  Filled 2022-01-16 (×29): qty 100

## 2022-01-16 MED ORDER — GADOBUTROL 1 MMOL/ML IV SOLN
9.0000 mL | Freq: Once | INTRAVENOUS | Status: AC | PRN
  Administered 2022-01-16: 9 mL via INTRAVENOUS

## 2022-01-16 MED ORDER — SODIUM CHLORIDE 0.9 % IV SOLN
2.0000 g | Freq: Three times a day (TID) | INTRAVENOUS | Status: DC
  Administered 2022-01-16 – 2022-01-30 (×44): 2 g via INTRAVENOUS
  Filled 2022-01-16 (×44): qty 12.5

## 2022-01-16 MED ORDER — SODIUM CHLORIDE 0.9% FLUSH
10.0000 mL | Freq: Two times a day (BID) | INTRAVENOUS | Status: DC
  Administered 2022-01-16 – 2022-01-22 (×12): 10 mL
  Administered 2022-01-22: 20 mL
  Administered 2022-01-23: 40 mL
  Administered 2022-01-23 – 2022-01-27 (×8): 10 mL
  Administered 2022-01-27: 20 mL
  Administered 2022-01-28 – 2022-01-29 (×3): 10 mL
  Administered 2022-01-30: 20 mL

## 2022-01-16 MED ORDER — VANCOMYCIN HCL IN DEXTROSE 1-5 GM/200ML-% IV SOLN
1000.0000 mg | Freq: Two times a day (BID) | INTRAVENOUS | Status: DC
  Administered 2022-01-16 – 2022-01-30 (×29): 1000 mg via INTRAVENOUS
  Filled 2022-01-16 (×31): qty 200

## 2022-01-16 MED ORDER — POTASSIUM CHLORIDE 10 MEQ/100ML IV SOLN
10.0000 meq | INTRAVENOUS | Status: AC
  Administered 2022-01-16 (×4): 10 meq via INTRAVENOUS
  Filled 2022-01-16 (×4): qty 100

## 2022-01-16 NOTE — Progress Notes (Signed)
NAME:  [name], [name] MRN:  [id number], DOB:  March 15, 1951, LOS: 1 ADMISSION DATE:  01/15/2022, CONSULTATION DATE:  01/15/22 REFERRING MD:  Adele Schilder, CHIEF COMPLAINT:  sepsis   History of Present Illness:  Mr. [name] is a 71 year old gentleman with a history of hepatitis C, cirrhosis with history hepatic encephalopathy, phantom limb syndrome and chronic oxycodone use who presented with confusion, low blood pressure, abdominal pain from [facility] health care where he has been  in rehabilitation since his most recent hospitalization.  He was discharged on 01/05/19 after admission for septic shock due to acute cholecystitis requiring percutaneous biliary drain, acute blood loss anemia due to hemolytic anemia and GI bleed, left thigh rhabdomyolysis and myonecrosis, AKI.  He has been on IM ceftriaxone since discharge.  Last dose today.  His brother reported that he was having diffuse pain yesterday but could not localize.  Today he was more confused.  He has been eating and drinking well until yesterday.  He presented to the ED hypotensive with a blood pressure 75/43.  He has partially responded to IV fluids, but MAP remains below 65.  His brother has provided additional history.  His main complaint is pain.  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  Swallow study.  He denies abdominal pain and other complaints.  Objective   Blood pressure (!) 122/50, pulse 99, temperature 98.9 F (37.2 C), temperature source Axillary, resp. rate 12, height 6' (1.829 m), weight 90.5 kg, SpO2 100 %.        Intake/Output Summary  (Last 24 hours) at 01/16/2022 0951 Last data filed at 01/16/2022 0841 Gross per 24 hour  Intake 2788.12 ml  Output 2650 ml  Net 138.12 ml   Filed Weights   01/15/22 1427 01/15/22 2010 01/16/22 0453  Weight: 101.6 kg 90.5 kg 90.5 kg    Examination: General: Chronically ill-appearing man lying in bed no acute distress HENT: [place]/AT, eyes anicteric, oral mucosa moist Lungs: Breathing comfortably on room air, CTAB Cardiovascular: S1-S2, regular rate and rhythm  Abdomen: Obese, soft, nontender Extremities: Right AKA, no significant LLE edema  neuro: awake and alert, staying awake throughout the encounter. Answering questions appropriately. Moving arms spontaneously, no focal deficits. GU: foley, amber urine  Lactic acid 2>2.6 WBC 2.0 H/H 5/15.9 Platelets 27 BUN 84 Creatinine 1.0 Bilirubin 2.3 AST 48 ALT 36  Fluid from biliary drain-few GPR, moderate GNR, moderate GPC, no WBCs Blood cultures-no growth to date Urine culture-pending Echo: LVEF 65 to 70%, concentric LVH, grade 1 diastolic dysfunction.  Normal PA.  Mildly dilated left atrium.  Resolved Hospital Problem list     Assessment & Plan:  Septic shock due to unknown source--concern this is related to left toe infection, possibly with dissemination although blood cultures remain negative so far..  Percutaneous biliary drain is in appropriate position & gallbladder was decompressed; no WBCs in biliary fluid so this seems less likely.   - Orthopedics consult for left foot -WOC consult - Orthopedics consulted for left foot - Cefepime, vancomycin, Flagyl -Norepinephrine to maintain MAP greater than 65; weaned off late this morning. - Continue to follow cultures until finalized  Acute metabolic encephalopathy, likely due to sepsis, AKI, hyperammonemia.  Has a history of hepatic encephalopathy. -Lactulose - Continue to monitor renal function - Treat sepsis  Acute on chronic anemia- hemoglobin ~9 on 6/13.  History of  hemolytic anemia on autoimmune anemia.  Labs not consistent with DIC. - Transfuse for hemoglobin less than 7 or hemodynamically significant bleeding; reviewed heme-onc notes from recent admission--no role for steroids with his known autoimmune hemolytic anemia --Continue empiric high-dose PPI -Monitor for potential sources of bleeding -Follow-up CBC this afternoon  Lactic acidosis, improved with volume -Goal of euvolemia - Maintain MAP greater than 65 - Continue empiric antibiotics-Vanc, Flagyl, cefepime  Acute on chronic thrombocytopenia - Transfuse for platelets less than 10 or <50 if bleeding  -Monitor  Leukopenia, likely due to sepsis - Monitor  AKI, prerenal versus ATN from AKI -Strict I's/O - Renally dose meds and avoid nephrotoxic meds - Continue to monitor - Continue Foley catheter for now  Decompensated cirrhosis potentially due to acute infection - Continue lactulose, passed swallow study sonographers NG tube - Continue supportive care - Continue monitoring LFTs  Hyperglycemia, controlled - SSI PRN -Goal blood glucose 140-180  Abdominal pain-resolved  Acute respiratory failure with hypoxia- resolved - Pulmonary hygiene  Coagulopathy due to cirrhosis - Monitor - No role for FFP at this time  Hypokalemia Hypomagnesemia - Repleted - Monitor  Sister and brother both and updated at bedside today.  Significantly improved and able to move to stepdown.  TRH to assume care tomorrow.  Best Practice (right click and "Reselect all SmartList Selections" daily)   Diet/type: Regular consistency (see orders) DVT prophylaxis: SCD GI prophylaxis: PPI Lines: Central line Foley:  Yes, and it is no longer needed and removal ordered  Code Status:  full code Last date of multidisciplinary goals of care discussion [6/19 with son, brother, patient]  Labs   CBC: Recent Labs  Lab 01/15/22 1416 01/15/22 1900 01/16/22 0315  WBC 4.2  --  2.0*  NEUTROABS 2.4  --   --    HGB 5.8*  --  5.0*  HCT 18.1*  --  15.9*  MCV 98.9  --  100.6*  PLT 35* 28* 27*     Basic Metabolic Panel: Recent Labs  Lab 01/15/22 1416 01/16/22 0315  NA 138 142  K 3.3* 3.4*  CL 101 105  CO2 24 24  GLUCOSE 154* 178*  BUN 100* 84*  CREATININE 2.09* 1.00  CALCIUM 8.0* 8.7*  MG  --  1.6*  PHOS  --  4.3    GFR: Estimated Creatinine Clearance: 73.3 mL/min (by C-G formula based on SCr of 1 mg/dL). Recent Labs  Lab 01/15/22 1416 01/15/22 1637 01/15/22 1900 01/15/22 2131 01/16/22 0315  PROCALCITON  --   --  0.71  --  0.45  WBC 4.2  --   --   --  2.0*  LATICACIDVEN 2.0* 2.6* 1.9 2.2*  --      Liver Function Tests: Recent Labs  Lab 01/15/22 1416 01/16/22 0315  AST 58* 48*  ALT 37 36  ALKPHOS 115 95  BILITOT 2.9* 2.3*  PROT 5.5* 5.9*  ALBUMIN 2.3* 2.7*    No results for input(s): "LIPASE", "AMYLASE" in the last 168 hours. Recent Labs  Lab 01/15/22 1416  AMMONIA 64*       Critical care time: 35 min.     Julian Hy, DO 01/16/22 2:20 PM [place] [facility]

## 2022-01-16 NOTE — Care Plan (Addendum)
This patient has never been established with [facility] & we are not on call for [facility] today. Pulmonology team will contact on-call for vascular/ortho with consult.  Armond Hang, MD Orthopaedic Surgery [facility]

## 2022-01-16 NOTE — Consult Note (Signed)
WOC Nurse Consult Note: Reason for Consult: Left LE areas of skin injury secondary to known severe peripheral disease. See Dr. Stephens Shire consult on 5/31 and Orthopedic Consult (Dr. Kathaleen Bury) on 5/27. Now with sacral deep tissue pressure injury (DTPI) that is expected to evolve to be a full thickness wound. Last seen by my associate S. Doty on 6/8. Wounds appear to have declined since that assessment. Patient is incontinent of a large stool at the time of my assessment. He cries out with discomfort at turning and asks not to be turned. He agrees to my assessment to be being cleaned up from his fecal incontinence. Numerous comorbid conditions that will make wound healing challenging. The wounds are considered to be non healable wounds.  Wound type: arterial insufficiency plus pressure Pressure Injury POA: Yes  Measurement/Description: Sacrum:6cm x 10cm area of purple discoloration with deeper maroon/black center. Peeling of epidermis consistent with DTPI. No drainage. Left 4th digit and lateral foot: Unstageable vs gangrenous, black stable tissue with odor consistent with nonviable tissue. Left lateral LE:8cm x 4cm x 1.5cm with ruddy red wound bed and small amount of serosanguinous exudate. Closed wound edges (epibole) Ankle: DTPI vs vasculitic changes, near-circumferential measuring 4cm x 19cm and presenting as purple/maroon discoloration Wound bed:As noted above Drainage (amount, consistency, odor) As noted above Periwound:As noted above and intact.  Dressing procedure/placement/frequency: A mattress replacement with low air loss feature is in place while in ICU; if patient transfers to floor, this is to be continued. A Prevalon boot to the left foot is to be placed. Topical care to the sacrum is to be with a xeroform gauze topped with a silicone foam oriented so that the "tip" is pointing away from the anus. Topical care to the left 5th digit and lateral foot is with twice daily paining with  povidone-iodine and followed by placement of a dry dressing. A Prevalon Boot is ordered. Topical care to the full thickness defect at the left Lateral LE is with a calcium alginate dressing changed daily. Topical care to the near-circumferential lesion at the ankle is to cleanse daily and cover with a silicone foam dressing.  If desired, a repeat consultation with Vascular Surgery or Orthopedics regarding the left limb is recommended.  [place] nursing team will not follow, but will remain available to this patient, the nursing and medical teams.  Please re-consult if needed.  Thank you for inviting [place] to participate in this patient's Plan of Care.  Maudie Flakes, MSN, RN, CNS, [place], Serita Grammes, [employer], [employer] phone:  [phone]

## 2022-01-16 NOTE — Progress Notes (Signed)
I spoke to Dr. Kathaleen Bury this evening, who has come to see [name]. MRI and possibly vascular surgery consultation has been recommended. Will consult vascular tomorrow.  Julian Hy, DO 01/16/22 6:38 PM [place] [facility]

## 2022-01-16 NOTE — TOC Progression Note (Signed)
Transition of Care (TOC) - Progression Note    Patient Details  Name: [name]. MRN: [id number] Date of Birth: 1950/10/07  Transition of Care [facility]) CM/SW Contact  Purcell Mouton, RN Phone Number: 01/16/2022, 2:44 PM  Clinical Narrative:    Pt is from [facility] and plan to return.    Expected Discharge Plan: [place] Barriers to Discharge: No Barriers Identified  Expected Discharge Plan and Services Expected Discharge Plan: [place] arrangements for the past 2 months: [place]                                       Social Determinants of Health (SDOH) Interventions    Readmission Risk Interventions     No data to display

## 2022-01-16 NOTE — Progress Notes (Signed)
I spoke to [place] on call- recommended speaking to [facility] since that practice had seen him during his last admission. I paged [facility] and they notified the physician who had seen him earlier this month for myonecrosis of his thigh. An MRI of his foot has been requested tonight.  Julian Hy, DO 01/16/22 5:11 PM  Pulmonary & Critical Care

## 2022-01-16 NOTE — Progress Notes (Signed)
[facility] ADULT ICU REPLACEMENT PROTOCOL   The patient does apply for the [facility] Adult ICU Electrolyte Replacment Protocol based on the criteria listed below:   1.Exclusion criteria: TCTS patients, ECMO patients, and Dialysis patients 2. Is GFR >/= 30 ml/min? Yes.    Patient's GFR today is >60 3. Is SCr </= 2? Yes.   Patient's SCr is 1.00 mg/dL 4. Did SCr increase >/= 0.5 in 24 hours? No. 5.Pt's weight >40kg  Yes.   6. Abnormal electrolyte(s): K+3.4, Mag 1.6  7. Electrolytes replaced per protocol 8.  Call MD STAT for K+ </= 2.5, Phos </= 1, or Mag </= 1 Physician:  Dr Mauri Brooklyn  Carlisle Beers 01/16/2022 5:54 AM

## 2022-01-16 NOTE — Progress Notes (Signed)
Peripherally Inserted Central Catheter Placement  The IV Nurse has discussed with the patient and/or persons authorized to consent for the patient, the purpose of this procedure and the potential benefits and risks involved with this procedure.  The benefits include less needle sticks, lab draws from the catheter, and the patient may be discharged home with the catheter. Risks include, but not limited to, infection, bleeding, blood clot (thrombus formation), and puncture of an artery; nerve damage and irregular heartbeat and possibility to perform a PICC exchange if needed/ordered by physician.  Alternatives to this procedure were also discussed.  Bard Power PICC patient education guide, fact sheet on infection prevention and patient information card has been provided to patient /or left at bedside.    PICC Placement Documentation  PICC Double Lumen 82/64/15 Right Basilic 42 cm 0 cm (Active)  Indication for Insertion or Continuance of Line Vasoactive infusions 01/16/22 1029  Exposed Catheter (cm) 0 cm 01/16/22 1029  Site Assessment Clean, Dry, Intact 01/16/22 1029  Lumen #1 Status Flushed;Saline locked;Blood return noted 01/16/22 1029  Lumen #2 Status Flushed;Saline locked;Blood return noted 01/16/22 1029  Dressing Type Transparent;Securing device 01/16/22 1029  Dressing Status Antimicrobial disc in place;Clean, Dry, Intact 01/16/22 1029  Safety Lock Not Applicable 83/09/40 7680  Dressing Intervention New dressing;Other (Comment) 01/16/22 1029  Dressing Change Due 01/23/22 01/16/22 1029     Patient's son, [name], signed consent via telephone. Verified by 2 PICC RNs.  Enos Fling 01/16/2022, 10:31 AM

## 2022-01-16 NOTE — Progress Notes (Signed)
Pharmacy Antibiotic Note  [name]. is a 71 y.o. male admitted on 01/15/2022 with presumed sepsis with foot as possible source. Patient initially with AKI which has since improved. Pharmacy has been consulted for vancomycin and cefepime dosing.  Plan: Increase vancomycin to 1000 mg IV q12h for improved SCr Change cefepime to 2 g IV q8h Continue to follow renal function, cultures and clinical progress for dose adjustments and de-escalation as indicated.   Height: 6' (182.9 cm) Weight: 90.5 kg (199 lb 8.3 oz) IBW/kg (Calculated) : 77.6  Temp (24hrs), Avg:98.8 F (37.1 C), Min:97.6 F (36.4 C), Max:100.6 F (38.1 C)  Recent Labs  Lab 01/15/22 1416 01/15/22 1637 01/15/22 1900 01/15/22 2131 01/16/22 0315  WBC 4.2  --   --   --  2.0*  CREATININE 2.09*  --   --   --  1.00  LATICACIDVEN 2.0* 2.6* 1.9 2.2*  --      Estimated Creatinine Clearance: 73.3 mL/min (by C-G formula based on SCr of 1 mg/dL).    Allergies  Allergen Reactions   Clindamycin Dermatitis   Unasyn [Ampicillin-Sulbactam Sodium] Rash    Diffuse whole body rash 12/28/21 Tolerated cefepime 01/15/22    Antimicrobials this admission: 6/19 vancomycin >>  6/19 cefepime >>  6/19 Flagyl >>  Dose adjustments this admission: 6/20 Vancomycin 1 g q24h > q12h  Microbiology results: 6/19 BCx: ngtd 6/19 UCx: sent  6/20 Fluid (gall bladder):  Thank you for allowing pharmacy to be a part of this patient's care.  Tawnya Crook, PharmD, BCPS Clinical Pharmacist 01/16/2022 1:39 PM

## 2022-01-16 NOTE — Progress Notes (Signed)
PCCM INTERVAL PROGRESS NOTE    Repeat hemoglobin 6.7  Transfuse one additional unit PRBC now  Patient requesting home xanax  Xanax at a reduced dose is reasonable for now, can consider increasing depending on how he tolerates.      Georgann Housekeeper, AGACNP-BC [facility]  See Amion for personal pager PCCM on call pager [phone] until 7pm. Please call Elink 7p-7a. [phone]  01/16/2022 4:04 PM

## 2022-01-17 DIAGNOSIS — L97521 Non-pressure chronic ulcer of other part of left foot limited to breakdown of skin: Secondary | ICD-10-CM

## 2022-01-17 DIAGNOSIS — R6521 Severe sepsis with septic shock: Secondary | ICD-10-CM | POA: Diagnosis not present

## 2022-01-17 DIAGNOSIS — I739 Peripheral vascular disease, unspecified: Secondary | ICD-10-CM

## 2022-01-17 DIAGNOSIS — D599 Acquired hemolytic anemia, unspecified: Secondary | ICD-10-CM | POA: Diagnosis not present

## 2022-01-17 DIAGNOSIS — A419 Sepsis, unspecified organism: Secondary | ICD-10-CM | POA: Diagnosis not present

## 2022-01-17 DIAGNOSIS — L97221 Non-pressure chronic ulcer of left calf limited to breakdown of skin: Secondary | ICD-10-CM

## 2022-01-17 DIAGNOSIS — G9340 Encephalopathy, unspecified: Secondary | ICD-10-CM | POA: Diagnosis not present

## 2022-01-17 LAB — HEMOGLOBIN AND HEMATOCRIT, BLOOD
HCT: 17.4 % — ABNORMAL LOW (ref 39.0–52.0)
HCT: 19.4 % — ABNORMAL LOW (ref 39.0–52.0)
Hemoglobin: 5.6 g/dL — CL (ref 13.0–17.0)
Hemoglobin: 6.3 g/dL — CL (ref 13.0–17.0)

## 2022-01-17 LAB — GLUCOSE, CAPILLARY
Glucose-Capillary: 183 mg/dL — ABNORMAL HIGH (ref 70–99)
Glucose-Capillary: 193 mg/dL — ABNORMAL HIGH (ref 70–99)
Glucose-Capillary: 232 mg/dL — ABNORMAL HIGH (ref 70–99)
Glucose-Capillary: 156 mg/dL — ABNORMAL HIGH (ref 70–99)
Glucose-Capillary: 156 mg/dL — ABNORMAL HIGH (ref 70–99)
Glucose-Capillary: 159 mg/dL — ABNORMAL HIGH (ref 70–99)

## 2022-01-17 LAB — COMPREHENSIVE METABOLIC PANEL
ALT: 28 U/L (ref 0–44)
AST: 28 U/L (ref 15–41)
Albumin: 2.8 g/dL — ABNORMAL LOW (ref 3.5–5.0)
Alkaline Phosphatase: 66 U/L (ref 38–126)
Anion gap: 8 (ref 5–15)
BUN: 34 mg/dL — ABNORMAL HIGH (ref 8–23)
CO2: 19 mmol/L — ABNORMAL LOW (ref 22–32)
Calcium: 7.5 mg/dL — ABNORMAL LOW (ref 8.9–10.3)
Chloride: 111 mmol/L (ref 98–111)
Creatinine, Ser: 0.48 mg/dL — ABNORMAL LOW (ref 0.61–1.24)
GFR, Estimated: >60 mL/min (ref >60)
Glucose, Bld: 292 mg/dL — ABNORMAL HIGH (ref 70–99)
Potassium: 2.7 mmol/L — CL (ref 3.5–5.1)
Sodium: 138 mmol/L (ref 135–145)
Total Bilirubin: 2 mg/dL — ABNORMAL HIGH (ref 0.3–1.2)
Total Protein: 5.2 g/dL — ABNORMAL LOW (ref 6.5–8.1)

## 2022-01-17 LAB — PREPARE RBC (CROSSMATCH)

## 2022-01-17 LAB — PROCALCITONIN: Procalcitonin: 0.12 ng/mL

## 2022-01-17 LAB — CREATININE, SERUM
Creatinine, Ser: 0.43 mg/dL — ABNORMAL LOW (ref 0.61–1.24)
GFR, Estimated: >60 mL/min (ref >60)

## 2022-01-17 LAB — MAGNESIUM: Magnesium: 1.7 mg/dL (ref 1.7–2.4)

## 2022-01-17 MED ORDER — SODIUM CHLORIDE 0.9% IV SOLUTION: Freq: Once | INTRAVENOUS | Status: AC

## 2022-01-17 MED ORDER — POTASSIUM CHLORIDE 10 MEQ/100ML IV SOLN
10.0000 meq | INTRAVENOUS | Status: AC
  Administered 2022-01-17 (×4): 10 meq via INTRAVENOUS
  Filled 2022-01-17 (×4): qty 100

## 2022-01-17 MED ORDER — FENTANYL 25 MCG/HR TD PT72
1.0000 | MEDICATED_PATCH | TRANSDERMAL | Status: DC
  Filled 2022-01-17: qty 1

## 2022-01-17 MED ORDER — FENTANYL 12 MCG/HR TD PT72: 1.0000 | MEDICATED_PATCH | TRANSDERMAL | Status: DC

## 2022-01-17 MED ORDER — OXYCODONE HCL 5 MG PO TABS
10.0000 mg | ORAL_TABLET | Freq: Four times a day (QID) | ORAL | Status: DC | PRN
  Administered 2022-01-17 – 2022-01-18 (×4): 10 mg via ORAL
  Filled 2022-01-17 (×4): qty 2

## 2022-01-17 MED ORDER — LACTULOSE 10 GM/15ML PO SOLN
30.0000 g | Freq: Three times a day (TID) | ORAL | Status: DC
  Administered 2022-01-17 – 2022-01-25 (×11): 30 g via ORAL
  Filled 2022-01-17 (×15): qty 45

## 2022-01-17 MED ORDER — LEVOTHYROXINE SODIUM 50 MCG PO TABS
50.0000 ug | ORAL_TABLET | Freq: Every day | ORAL | Status: DC
  Administered 2022-01-18 – 2022-01-31 (×14): 50 ug via ORAL
  Filled 2022-01-17 (×15): qty 1

## 2022-01-17 NOTE — Progress Notes (Signed)
[facility] Progress Note Patient Name: [name]. DOB: [birth year] MRN: [id number]   Date of Service  01/17/2022  HPI/Events of Note  Hgb 5.6.  no obvious signs of active bleeding.  Difficult crossmatch.  eICU Interventions  Transfuse 1 unit prbc     Intervention Category Major Interventions: Other:  Mauri Brooklyn, P 01/17/2022, 5:29 AM

## 2022-01-17 NOTE — Progress Notes (Signed)
PROGRESS NOTE  Derek Anon Kaeser Jr. PPJ:[id number] DOB: 1951/04/23 DOA: 01/15/2022 PCP: [name], FNP  HPI/Recap of past 24 hours: [name] is a 71 year old gentleman with a history of hepatitis C, cirrhosis with history hepatic encephalopathy, phantom limb syndrome and chronic oxycodone use who presented with confusion, low blood pressure, abdominal pain from [facility] health care where he has been in rehabilitation since his most recent hospitalization.  He was discharged on 01/04/22, after admission for septic shock due to acute cholecystitis requiring percutaneous biliary drain, acute blood loss anemia due to hemolytic anemia and GI bleed, left thigh rhabdomyolysis and myonecrosis, AKI. Reported that he was having worsening diffuse pain and confusion. Presented to the ED hypotensive with a blood pressure 75/43. PCCM admitted and stabilized. TRH assumed care on 01/17/22.   Today, patient's still complains of generalized chronic pain, more alert, denies any chest pain, abdominal pain nausea/vomiting, fever/chills.   Assessment/Plan: Principal Problem:   Acute encephalopathy Active Problems:   Septic shock (HCC)   Septic shock due to likely L toe infection Vs multiple pressure ulcers BP improved off pressors Currently afebrile, with no leukocytosis Blood cultures remain negative so far MRI of LLE, with no evidence of acute osteomyelitis Percutaneous biliary drain is in appropriate position & gallbladder was decompressed; no WBCs in biliary fluid Orthopedics and Vascular surgery consult for left foot WOC consult Continue cefepime, vancomycin, Flagyl   Acute metabolic encephalopathy Improved Likely due to sepsis, AKI, hyperammonemia History of hepatic encephalopathy Continue lactulose   Acute on chronic anemia- hemoglobin ~9 on 6/13 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 Transfuse for hemoglobin less than 7 or hemodynamically  significant bleeding; reviewed heme-onc notes from recent admission--no role for steroids with his known autoimmune hemolytic anemia Continue empiric high-dose PPI Monitor for potential sources of bleeding Frequent CBC monitoring  Chronic pain syndrome Restart home pain regimen gradually pending mental status and vital signs   Lactic acidosis Improved with volume Goal of euvolemia Continue empiric antibiotics-Vanc, Flagyl, cefepime   Acute on chronic thrombocytopenia History of cirrhosis Transfuse for platelets less than 10 or <50 if bleeding    AKI, prerenal versus ATN from AKI Resolved Strict I's/O, renally dose meds and avoid nephrotoxic meds Continue Foley catheter for now  Decompensated cirrhosis potentially due to acute infection Continue lactulose Continue supportive care, monitoring LFTs  Hyperglycemia Worsened by steroid use SSI PRN  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 purple, red (Active)  12/18/21 1530  Location: Foot  Location Orientation: Anterior;Left;Lateral  Staging: Deep Tissue Pressure Injury - Purple or maroon localized area of discolored intact skin or blood-filled blister due to damage of underlying soft tissue from pressure and/or shear.  Wound Description (Comments): purple, red  Present on Admission: Yes  Dressing Type Foam - Lift dressing to assess site every shift 01/17/22 0800     Pressure Injury 01/03/22 Toe (Comment  which one) Anterior;Left Unstageable - Full thickness tissue loss in which the base of the injury is covered by slough (yellow, tan, gray, green or brown) and/or eschar (tan,  brown or black) in the wound bed. (Active)  01/03/22 1000  Location: Toe (Comment  which one)  Location Orientation: Anterior;Left  Staging: Unstageable - Full thickness tissue loss in which the base of the injury is covered by slough (yellow, tan, gray, green or brown) and/or eschar (tan, brown or black) in the wound bed.  Wound Description (Comments):   Present on Admission:   Dressing Type Foam - Lift dressing to assess site every shift 01/17/22 0800     Pressure Injury 01/03/22 Sacrum Mid Unstageable - Full thickness tissue loss in which the base of the injury is covered by slough (yellow, tan, gray, green or brown) and/or eschar (tan, brown or black) in the wound bed. (Active)  01/03/22 1000  Location: Sacrum  Location Orientation: Mid  Staging: Unstageable - Full thickness tissue loss in which the base of the injury is covered by slough (yellow, tan, gray, green or brown) and/or eschar (tan, brown or black) in the wound bed.  Wound Description (Comments):   Present on Admission:   Dressing Type Foam - Lift dressing to assess site every shift 01/17/22 0800     Pressure Injury 01/03/22 Heel Left Deep Tissue Pressure Injury - Purple or maroon localized area of discolored intact skin or blood-filled blister due to damage of underlying soft tissue from pressure and/or shear. (Active)  01/03/22 1000  Location: Heel  Location Orientation: Left  Staging: Deep Tissue Pressure Injury - Purple or maroon localized area of discolored intact skin or blood-filled blister due to damage of underlying soft tissue from pressure and/or shear.  Wound Description (Comments):   Present on Admission:   Dressing Type Foam - Lift dressing to assess site every shift 01/16/22 2000     Pressure Injury 01/15/22 Pretibial Distal;Left Unstageable - Full thickness tissue loss in which the base of the injury is covered by slough (yellow, tan, gray, green or brown) and/or eschar (tan, brown or black) in the wound bed.  unstageable wound with  (Active)  01/15/22 2010  Location: Pretibial  Location Orientation: Distal;Left  Staging: Unstageable - Full thickness tissue loss in which the base of the injury is covered by slough (yellow, tan, gray, green or brown) and/or eschar (tan, brown or black) in the wound bed.  Wound Description (Comments): unstageable wound with drainage  Present on Admission: Yes  Dressing Type Foam - Lift dressing to assess site every shift 01/17/22 0800         Estimated body mass index is 30.08 kg/m as calculated from the following:   Height as of this encounter: 6' (1.829 m).   Weight as of this encounter: 100.6 kg.     Code Status: Full  Family Communication: None at bedside  Disposition Plan: Status is: Inpatient Remains inpatient appropriate because: Level of care      Consultants: PCCM Orthopedics Vascular surgery  Procedures: None  Antimicrobials: Vancomycin Cefepime Metronidazole  DVT prophylaxis: SCDs due to autoimmune hemolytic anemia   Objective: Vitals:   01/17/22 1200 01/17/22 1201 01/17/22 1300 01/17/22 1500  BP:  (!) 163/97 (!) 164/78 (!) 159/65  Pulse: (!) 52 80 (!) 54 92  Resp: 17 15 (!) 22 17  Temp:  98 F (36.7 C)    TempSrc:  Oral    SpO2: 96% 96% 98% 96%  Weight:      Height:        Intake/Output Summary (Last 24 hours) at 01/17/2022 1545 Last data filed at 01/17/2022 1422 Gross per 24 hour  Intake 3142.98 ml  Output 1300 ml  Net 1842.98 ml   [employer]  01/15/22 2010 01/16/22 0453 01/17/22 0500  Weight: 90.5 kg 90.5 kg 100.6 kg    Exam: General: Chronically ill-appearing, NAD, awake, alert Cardiovascular: S1, S2 present Respiratory: CTAB Abdomen: Soft, nontender, nondistended, bowel sounds present Musculoskeletal: Right AKA, no bilateral pedal edema noted Skin: Noted multiple pressure ulcers Psychiatry: Normal mood     Data Reviewed: CBC: Recent Labs  Lab 01/15/22 1416 01/15/22 1900 01/16/22 0315  01/16/22 1505 01/17/22 0434 01/17/22 1130  WBC 4.2  --  2.0* 2.2*  --   --   NEUTROABS 2.4  --   --   --   --   --   HGB 5.8*  --  5.0* 6.7* 5.6* 6.3*  HCT 18.1*  --  15.9* 20.5* 17.4* 19.4*  MCV 98.9  --  100.6* 97.6  --   --   PLT 35* 28* 27* 25*  --   --    Basic Metabolic Panel: Recent Labs  Lab 01/15/22 1416 01/16/22 0315 01/17/22 0434 01/17/22 1130  NA 138 142  --  138  K 3.3* 3.4*  --  2.7*  CL 101 105  --  111  CO2 24 24  --  19*  GLUCOSE 154* 178*  --  292*  BUN 100* 84*  --  34*  CREATININE 2.09* 1.00 0.43* 0.48*  CALCIUM 8.0* 8.7*  --  7.5*  MG  --  1.6*  --   --   PHOS  --  4.3  --   --    GFR: Estimated Creatinine Clearance: 102.5 mL/min (A) (by C-G formula based on SCr of 0.48 mg/dL (L)). Liver Function Tests: Recent Labs  Lab 01/15/22 1416 01/16/22 0315 01/17/22 1130  AST 58* 48* 28  ALT 37 36 28  ALKPHOS 115 95 66  BILITOT 2.9* 2.3* 2.0*  PROT 5.5* 5.9* 5.2*  ALBUMIN 2.3* 2.7* 2.8*   No results for input(s): "LIPASE", "AMYLASE" in the last 168 hours. Recent Labs  Lab 01/15/22 1416  AMMONIA 64*   Coagulation Profile: Recent Labs  Lab 01/15/22 1416 01/15/22 1900 01/16/22 0315  INR 1.4* >10.0* 1.4*   Cardiac Enzymes: No results for input(s): "CKTOTAL", "CKMB", "CKMBINDEX", "TROPONINI" in the last 168 hours. BNP (last 3 results) No results for input(s): "PROBNP" in the last 8760 hours. HbA1C: No results for input(s): "HGBA1C" in the last 72 hours. CBG: Recent Labs  Lab 01/16/22 2033 01/16/22 2331 01/17/22 0432 01/17/22 0728 01/17/22 1115  GLUCAP 155* 209* 183* 193* 232*   Lipid Profile: No results for input(s): "CHOL", "HDL", "LDLCALC", "TRIG", "CHOLHDL", "LDLDIRECT" in the last 72 hours. Thyroid Function Tests: No results for input(s): "TSH", "T4TOTAL", "FREET4", "T3FREE", "THYROIDAB" in the last 72 hours. Anemia Panel: No results for input(s): "VITAMINB12", "FOLATE", "FERRITIN", "TIBC", "IRON", "RETICCTPCT" in the last 72  hours. Urine analysis:    Component Value Date/Time   COLORURINE AMBER (A) 01/15/2022 1412   APPEARANCEUR HAZY (A) 01/15/2022 1412   LABSPEC 1.012 01/15/2022 1412   PHURINE 5.0 01/15/2022 1412   GLUCOSEU NEGATIVE 01/15/2022 1412   GLUCOSEU NEGATIVE 01/06/2015 0912   HGBUR NEGATIVE 01/15/2022 1412   BILIRUBINUR NEGATIVE 01/15/2022 1412   KETONESUR NEGATIVE 01/15/2022 1412   PROTEINUR NEGATIVE 01/15/2022 1412   UROBILINOGEN 1.0 01/06/2015 0912   NITRITE NEGATIVE 01/15/2022 1412   LEUKOCYTESUR NEGATIVE 01/15/2022 1412   Sepsis Labs: '@LABRCNTIP'$ (procalcitonin:4,lacticidven:4)  ) Recent Results (from the past 240 hour(s))  Blood Culture (routine x 2)     Status: None (Preliminary result)   Collection Time: 01/15/22  2:11 PM   Specimen: BLOOD  Result Value Ref Range Status   Specimen Description   Final    BLOOD BLOOD LEFT HAND Performed at [facility] [address], [place], [place] [address]    Special Requests   Final    BOTTLES DRAWN AEROBIC AND ANAEROBIC Blood Culture results may not be optimal due to an inadequate volume of blood received in culture bottles Performed at [facility] [address] [place], [place] [address]    Culture   Final    NO GROWTH 2 DAYS Performed at [facility] [address], [place], [place] [address]    Report Status PENDING  Incomplete  Urine Culture     Status: None   Collection Time: 01/15/22  2:12 PM   Specimen: In/Out Cath Urine  Result Value Ref Range Status   Specimen Description   Final    IN/OUT CATH URINE Performed at [facility] [address], [place], [place] [address]    Special Requests   Final    NONE Performed at [facility], [place] [address] [place], [place] [address]    Culture   Final    NO GROWTH Performed at [facility], [place] [address] [place], [place] [address]    Report Status 01/16/2022 FINAL  Final  Resp Panel  by RT-PCR (Flu A&B, Covid) Anterior Nasal Swab     Status: None   Collection Time: 01/15/22  3:22 PM   Specimen: Anterior Nasal Swab  Result Value Ref Range Status   SARS Coronavirus 2 by RT PCR NEGATIVE NEGATIVE Final    Comment: (NOTE) SARS-CoV-2 target nucleic acids are NOT DETECTED.  The SARS-CoV-2 RNA is generally detectable in upper respiratory specimens during the acute phase of infection. The lowest concentration of SARS-CoV-2 viral copies this assay can detect is 138 copies/mL. A negative result does not preclude SARS-Cov-2 infection and should not be used as the sole basis for treatment or other patient management decisions. A negative result may occur with  improper specimen collection/handling, submission of specimen other than nasopharyngeal swab, presence of viral mutation(s) within the areas targeted by this assay, and inadequate number of viral copies(<138 copies/mL). A negative result must be combined with clinical observations, patient history, and epidemiological information. The expected result is Negative.  Fact Sheet for Patients:  EntrepreneurPulse.com.au  Fact Sheet for Healthcare Providers:  IncredibleEmployment.be  This test is no t yet approved or cleared by the [place] FDA and  has been authorized for detection and/or diagnosis of SARS-CoV-2 by FDA under an Emergency Use Authorization (EUA). This EUA will remain  in effect (meaning this test can be used) for the duration of the COVID-19 declaration under Section 564(b)(1) of the Act, 21 U.S.C.section 360bbb-3(b)(1), unless the authorization is terminated  or revoked sooner.       Influenza A by PCR NEGATIVE NEGATIVE Final   Influenza B by PCR NEGATIVE NEGATIVE Final    Comment: (NOTE) The Xpert Xpress SARS-CoV-2/FLU/RSV plus assay is intended as an aid in the diagnosis of influenza from Nasopharyngeal swab specimens and should not be used as a sole basis  for treatment. Nasal washings and aspirates are unacceptable for Xpert Xpress SARS-CoV-2/FLU/RSV testing.  Fact Sheet for Patients: EntrepreneurPulse.com.au  Fact Sheet for Healthcare Providers: IncredibleEmployment.be  This test is not yet approved or cleared by the [place] FDA and has been authorized for detection and/or diagnosis of SARS-CoV-2 by  FDA under an Emergency Use Authorization (EUA). This EUA will remain in effect (meaning this test can be used) for the duration of the COVID-19 declaration under Section 564(b)(1) of the Act, 21 U.S.C. section 360bbb-3(b)(1), unless the authorization is terminated or revoked.  Performed at [facility], [place] [address] [place], [place] [address]   MRSA Next Gen by PCR, Nasal     Status: None   Collection Time: 01/15/22  8:21 PM   Specimen: Nasal Mucosa; Nasal Swab  Result Value Ref Range Status   MRSA by PCR Next Gen NOT DETECTED NOT DETECTED Final    Comment: (NOTE) The GeneXpert MRSA Assay (FDA approved for NASAL specimens only), is one component of a comprehensive MRSA colonization surveillance program. It is not intended to diagnose MRSA infection nor to guide or monitor treatment for MRSA infections. Test performance is not FDA approved in patients less than 19 years old. Performed at [facility], [place] [address] [place], [place] [address]   Blood Culture (routine x 2)     Status: None (Preliminary result)   Collection Time: 01/15/22  9:31 PM   Specimen: BLOOD RIGHT HAND  Result Value Ref Range Status   Specimen Description   Final    BLOOD RIGHT HAND Performed at [facility] [address], [place], [place] [address]    Special Requests   Final    BOTTLES DRAWN AEROBIC ONLY Blood Culture adequate volume Performed at [facility] [address], [place], [place] [address]    Culture   Final    NO  GROWTH 2 DAYS Performed at [facility] [address] [place], [place] [address]    Report Status PENDING  Incomplete  Body fluid culture w Gram Stain     Status: None (Preliminary result)   Collection Time: 01/16/22  2:20 AM   Specimen: Gallbladder; Body Fluid  Result Value Ref Range Status   Specimen Description   Final    GALL BLADDER Performed at [facility] [address] [place], [place] [address]    Special Requests   Final    NONE Performed at [facility], [place] [address], [place], [place] [address]    Gram Stain   Final    NO SQUAMOUS EPITHELIAL CELLS SEEN NO WBC SEEN FEW GRAM POSITIVE RODS MODERATE GRAM NEGATIVE RODS MODERATE GRAM POSITIVE COCCI    Culture   Final    CULTURE REINCUBATED FOR BETTER GROWTH Performed at [facility], [place] [address], [place], [place] [address]    Report Status PENDING  Incomplete      Studies: MR TIBIA FIBULA LEFT W WO CONTRAST  Result Date: 01/16/2022 CLINICAL DATA:  Tibia and fibula osteomyelitis suspected. Ulcer of the distal lateral lower leg. EXAM: MRI OF LOWER LEFT EXTREMITY WITHOUT AND WITH CONTRAST TECHNIQUE: Multiplanar, multisequence MR imaging of the left tibia and fibula was performed both before and after administration of intravenous contrast. The technologist reports the patient was only able to lie on the left side. Best images possible were performed. CONTRAST:  36m GADAVIST GADOBUTROL 1 MMOL/ML IV SOLN COMPARISON:  CT left foot 01/15/2022 FINDINGS: Bones/Joint/Cartilage There are some areas of mild heterogeneous marrow signal especially within the mid height of the fibular diaphysis where there is patchy decreased T1 increased T2 signal (axial series 21 images 28 through 40, coronal series 19, image 15). This may represent mild stress related marrow edema. No  cortical erosion is seen to specifically indicate suspicion for acute osteomyelitis. Ligaments Grossly  unremarkable. Muscles and Tendons There is moderate edema throughout the visualized distal left calf musculature greatest within the anterior and deep posterior compartments and also the soleus muscle of the superficial posterior compartment. Mild fluid tracks around the posterior tibial, flexor hallucis longus and flexor digitorum longus muscles. Mild feathery fatty infiltration within the minimally partially visualized distal medial head of the gastrocnemius muscle. Soft tissues Moderate diffuse subcutaneous fat swelling, greatest within the distal anterolateral calf. There is a broad ulceration within the distal lateral calf region measuring up to approximately 6.5 cm in craniocaudal dimension (coronal series 19, image 15). This comes close to the distal lateral fibular bone however does not appear to contact the bone and the cortex remains intact. No abnormal fibula marrow signal. No rim enhancing abscess is seen. IMPRESSION: 1. There is a deep ulcer within the distal lateral left calf measuring up to approximately 6.5 cm in craniocaudal dimension and coming close to the distal lateral fibular diaphysis. Nevertheless, the fibular cortex remains intact. No evidence of acute osteomyelitis. 2. There is mild heterogeneity of the more proximal (at mid height) fibular diaphyseal marrow that may include mild nonspecific marrow edema, however no cortical erosion is seen to specifically indicate acute osteomyelitis in this region. 3. Moderate edema within the visualized musculature, greatest within the anterior and deep posterior muscle compartments. Electronically Signed   By: Yvonne Kendall M.D.   On: 01/16/2022 20:57   MR FOOT LEFT W WO CONTRAST  Result Date: 01/16/2022 CLINICAL DATA:  Left fifth toe ulcer. Ulcer of distal lateral lower leg. Evaluate for acute osteomyelitis. History of right leg amputation. EXAM: MRI OF THE LEFT FOREFOOT WITHOUT AND WITH CONTRAST TECHNIQUE: Multiplanar, multisequence MR imaging of  the left forefoot was performed both before and after administration of intravenous contrast. The technologist reports the patient had a lie on his left side and the best possible images were performed. CONTRAST:  59m GADAVIST GADOBUTROL 1 MMOL/ML IV SOLN COMPARISON:  CT left foot 01/15/2022; FINDINGS: Bones/Joint/Cartilage There is decreased T1 increased T2 signal within the subcutaneous fat just dorsal lateral to the fifth metatarsophalangeal joint (axial series 7, image 18 and sagittal series 11 image 27). This comes close to the dorsal lateral aspect of the fifth metatarsophalangeal joint, however no cortical erosion or marrow edema is seen within the nearby bones to indicate acute osteomyelitis. There is increased STIR signal within the distal phalanges of the second (sagittal series 11, image 17) and third (sagittal series 11, image 21) toes. No definite adjacent ulceration is seen, however recommend clinical correlation for clinical suspicion for infection in this region. The increased STIR signal within the phalanges may be artifactual towards the edge of the image series. Moderate cartilage thinning and subchondral degenerative cystic changes throughout the third through fifth tarsometatarsal joints, with more severe osteoarthritis of the first and second tarsometatarsal joints. There is mild marrow edema within the dorsal third metatarsal head and dorsal lateral second metatarsal head which may represent degenerative subchondral marrow edema (coronal series 9 images 8 through 10, sagittal series 11 images 16 through 20). Ligaments The Lisfranc ligament complex appears intact. The plantar plates appear intact. Muscles and Tendons There is moderate diffuse edema throughout the visualized dorsal and plantar midfoot musculature. Soft tissues Moderate forefoot subcutaneous fat dorsal soft tissue swelling. IMPRESSION: 1. There may be a small ulcer dorsal lateral to the fifth metatarsophalangeal joint no definite  osteomyelitis is visualized  within the fifth ray. 2. There is increased STIR signal within the distal phalanges of the second and third toes on sagittal STIR sequence (series 11). However no adjacent ulceration is seen. Recommend clinical correlation for any clinical suspicion for infection in the second and third toe. 3. 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 4. Moderate diffuse plantar and dorsal muscle edema. This is nonspecific myositis. 5. Moderate diffuse soft tissue swelling consistent with cellulitis. Electronically Signed   By: Yvonne Kendall M.D.   On: 01/16/2022 20:44    Scheduled Meds:  sodium chloride   Intravenous Once   Chlorhexidine Gluconate Cloth  6 each Topical Daily   insulin aspart  0-15 Units Subcutaneous Q4H   lactulose  30 g Oral TID   [START ON 01/18/2022] levothyroxine  50 mcg Oral QAC breakfast   methylPREDNISolone (SOLU-MEDROL) injection  60 mg Intravenous Q12H   pantoprazole (PROTONIX) IV  40 mg Intravenous Q12H   sodium chloride flush  10-40 mL Intracatheter Q12H    Continuous Infusions:  sodium chloride 150 mL/hr at 01/17/22 1300   sodium chloride     albumin human Stopped (01/17/22 1227)   ceFEPime (MAXIPIME) IV Stopped (01/17/22 1421)   metronidazole Stopped (01/17/22 1054)   vancomycin Stopped (01/17/22 1052)     LOS: 2 days     Alma Friendly, MD [facility]  If 7PM-7AM, please contact night-coverage [email or web address] 01/17/2022, 3:45 PM

## 2022-01-17 NOTE — Consult Note (Signed)
Vascular and Vein Specialist of [place]  Patient name: Derek Bogert Stanczyk Jr. MRN: [id number] DOB: [birth year] Sex: male   REQUESTING PROVIDER:    Hospitalists   REASON FOR CONSULT:    Left toe ulcer  HISTORY OF PRESENT ILLNESS:   [name]. is a 71 y.o. male, who I had previously consulted on an May of this year.  He was admitted to the hospital on 12/18/2021 with shock, encephalopathy, and severe anemia with acute kidney injury.  He had been found down on the ground by his family after sliding out of bed.  He developed myonecrosis of the left leg which was managed nonoperatively by orthopedics.  He required a cholecystostomy tube for acute cholecystitis.  I was asked to see him for left leg pain and numbness which I felt was related to his myonecrosis and not vascular insufficiency.  He did have ABIs which were 0.32 on the left.  He has a traumatic right leg amputation.  To further evaluate this, I ordered a CT angiogram that showed severe left common femoral disease.  Because of the location in the groin region, this is usually treated surgically.  Because I did not feel his issues were related to his vascular disease, and because of his comorbidities including liver disease with cirrhosis and thrombocytopenia with platelet counts in the 20s, I did not feel he was a good surgical candidate and so no intervention was performed.  He was ultimately discharged to [facility].  He was recently readmitted.  He had been on IM ceftriaxone.  He began having diffuse pain and anorexia.  He was also hypotensive.  He had developed a ischemic left fifth toe.  He was also anemic.  I have been asked to evaluate him for his new left toe issues.  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   FRACTURE, WRIST, LEFT 10/12/2009   Qualifier: Diagnosis of  By: Loanne Drilling MD, Sean A    HEPATITIS C 03/24/2007   HYPERLIPIDEMIA 09/12/2009   Hypocalcemia 12/23/2007   Qualifier: Diagnosis of  By: Loanne Drilling MD, Sean A    Pancytopenia 12/23/2007   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   Squamous cell carcinoma of skin 05/21/2018   R mid volar forearm   Thyroid disease    UROLITHIASIS, HX OF 12/23/2007     FAMILY HISTORY   Family History  Problem Relation Age of Onset   Heart disease Mother    Lung cancer Mother 58   Heart attack Paternal Grandmother    Heart attack Paternal Grandfather    Liver cancer Neg Hx     SOCIAL HISTORY:   Social History   Socioeconomic History   Marital status: Widowed    Spouse name: Not on file   Number of children: Not on file   Years of education: Not on file   Highest education level: Not on file  Occupational History   Occupation: Disabled  Tobacco Use   Smoking status: Never   Smokeless tobacco: Never  Substance and Sexual Activity   Alcohol use: Not Currently    Alcohol/week: 0.0 standard drinks of alcohol   Drug use: No   Sexual activity: Not on file  Other Topics Concern   Not on file  Social History Narrative   Widowed 1997   Lives alone   Lost a son in his arms in 1978.    3 sons   Social Determinants of Health   Financial Resource Strain: Low Risk  (08/08/2020)   Overall Financial Resource Strain (CARDIA)    Difficulty of Paying Living Expenses: Not hard at all  Food Insecurity: No Food Insecurity (08/08/2020)   Hunger Vital Sign    Worried About Running Out of Food in the Last Year: Never true    Ran Out of Food in the Last Year: Never true  Transportation Needs: No Transportation Needs (08/08/2020)   PRAPARE - Hydrologist (Medical): No    Lack of Transportation (Non-Medical): No  Physical Activity: Not on file  Stress: No Stress Concern Present (08/08/2020)    [place]    Feeling of Stress : Not at all  Social Connections: Unknown (08/08/2020)   Social Connection and Isolation Panel [NHANES]    Frequency of Communication with Friends and Family: More than three times a week    Frequency of Social Gatherings with Friends and Family: More than three times a week    Attends Religious Services: Not on file    Active Member of Clubs or Organizations: Not on file    Attends Archivist Meetings: Not on file    Marital Status: Widowed  Intimate Partner Violence: Not At Risk (08/08/2020)   Humiliation, Afraid, Rape, and Kick questionnaire    Fear of Current or Ex-Partner: No    Emotionally Abused: No    Physically Abused: No    Sexually Abused: No    ALLERGIES:    Allergies  Allergen Reactions   Clindamycin Dermatitis   Unasyn [Ampicillin-Sulbactam Sodium] Rash    Diffuse whole body rash 12/28/21 Tolerated cefepime 01/15/22    CURRENT MEDICATIONS:    Current Facility-Administered Medications  Medication Dose Route Frequency Provider Last Rate Last Admin   0.9 %  sodium chloride infusion (Manually program via Guardrails IV Fluids)   Intravenous Once Corey Harold, NP   Held at 01/16/22 2105   0.9 %  sodium chloride infusion   Intravenous Continuous Alma Friendly, MD 75 mL/hr at 01/17/22 2000 Infusion Verify at 01/17/22 2000   0.9 %  sodium chloride infusion  250 mL Intravenous Continuous Corey Harold, NP       albumin human 25 % solution 25 g  25 g Intravenous Q6H Georgette Shell, MD 36 mL/hr at 01/17/22 2000 Infusion Verify at 01/17/22 2000   ALPRAZolam (XANAX) tablet 0.5 mg  0.5 mg Oral BID PRN Corey Harold, NP   0.5 mg at 01/17/22 0649   ceFEPIme (MAXIPIME) 2 g in sodium chloride 0.9 % 100 mL IVPB  2 g Intravenous Q8H Ellington, Abby K, RPH   Stopped at 01/17/22 1421   Chlorhexidine Gluconate Cloth 2 % PADS 6 each  6 each Topical Daily Julian Hy, DO   6 each at 01/17/22 1812   docusate sodium (COLACE) capsule 100 mg  100 mg Oral BID PRN Corey Harold, NP       insulin aspart (novoLOG) injection 0-15 Units  0-15 Units Subcutaneous Q4H Corey Harold, NP   3 Units at 01/17/22 2009   lactulose (CHRONULAC) 10 GM/15ML solution 30 g  30 g Oral TID Graylin Shiver L, RPH   30 g at 01/17/22 1621   [START  ON 01/18/2022] levothyroxine (SYNTHROID) tablet 50 mcg  50 mcg Oral QAC breakfast Alma Friendly, MD       methylPREDNISolone sodium succinate (SOLU-MEDROL) 125 mg/2 mL injection 60 mg  60 mg Intravenous Q12H Corey Harold, NP   60 mg at 01/17/22 2008   metroNIDAZOLE (FLAGYL) IVPB 500 mg  500 mg Intravenous Q12H Julian Hy, DO   Stopped at 01/17/22 1054   morphine (PF) 2 MG/ML injection 2 mg  2 mg Intravenous Q4H PRN Mauri Brooklyn, MD   2 mg at 01/17/22 1409   Oral care mouth rinse  15 mL Mouth Rinse PRN Noemi Chapel P, DO   15 mL at 01/16/22 0210   oxyCODONE (Oxy IR/ROXICODONE) immediate release tablet 10 mg  10 mg Oral Q6H PRN Alma Friendly, MD   10 mg at 01/17/22 1709   pantoprazole (PROTONIX) injection 40 mg  40 mg Intravenous Q12H Corey Harold, NP   40 mg at 01/17/22 0945   phenol (CHLORASEPTIC) mouth spray 1 spray  1 spray Mouth/Throat PRN Julian Hy, DO   1 spray at 01/15/22 2158   polyethylene glycol (MIRALAX / GLYCOLAX) packet 17 g  17 g Oral Daily PRN Corey Harold, NP       potassium chloride 10 mEq in 100 mL IVPB  10 mEq Intravenous Q1 Hr x 4 Alma Friendly, MD 100 mL/hr at 01/17/22 2026 10 mEq at 01/17/22 2026   sodium chloride flush (NS) 0.9 % injection 10-40 mL  10-40 mL Intracatheter Q12H Noemi Chapel P, DO   10 mL at 01/17/22 0946   sodium chloride flush (NS) 0.9 % injection 10-40 mL  10-40 mL Intracatheter PRN Julian Hy, DO       vancomycin (VANCOCIN) IVPB 1000 mg/200 mL premix  1,000 mg Intravenous Q12H Tawnya Crook, RPH   Stopped at 01/17/22 1052    REVIEW OF SYSTEMS:   '[X]'$  denotes  positive finding, '[ ]'$  denotes negative finding Cardiac  Comments:  Chest pain or chest pressure:    Shortness of breath upon exertion:    Short of breath when lying flat:    Irregular heart rhythm:        Vascular    Pain in calf, thigh, or hip brought on by ambulation:    Pain in feet at night that wakes you up from your sleep:     Blood clot in your veins:    Leg swelling:         Pulmonary    Oxygen at home:    Productive cough:     Wheezing:         Neurologic    Sudden weakness in arms or legs:     Sudden numbness in arms or legs:     Sudden onset of difficulty speaking or slurred speech:    Temporary loss of vision in one eye:     Problems with dizziness:         Gastrointestinal    Blood in stool:      Vomited blood:         Genitourinary    Burning when urinating:     Blood in urine:        Psychiatric    Major depression:         Hematologic    Bleeding problems:    Problems with blood clotting too easily:        Skin    Rashes or ulcers:  Constitutional    Fever or chills:     PHYSICAL EXAM:   Vitals:   01/17/22 1817 01/17/22 1900 01/17/22 2000 01/17/22 2013  BP: (!) 153/69 (!) 149/59 (!) 160/59 (!) 158/61  Pulse: 87 66 62   Resp: '15 14 13   '$ Temp: 98.8 F (37.1 C)   98.2 F (36.8 C)  TempSrc: Oral   Axillary  SpO2: 94% 94% 94%   Weight:      Height:        GENERAL: The patient is a well-nourished male, in no acute distress. The vital signs are documented above. CARDIAC: There is a regular rate and rhythm.  VASCULAR: Nonpalpable left pedal pulse.  Nonpalpable femoral pulse on the left PULMONARY: Nonlabored respirations MUSCULOSKELETAL: Left leg is contracted NEUROLOGIC: No focal weakness or paresthesias are detected. SKIN: See photo below PSYCHIATRIC: The patient has a normal affect.   STUDIES:   I have reviewed the following studies: MRI foot: 1. There may be a small ulcer dorsal lateral to the fifth metatarsophalangeal  joint no definite osteomyelitis is visualized within the fifth ray. 2. There is increased STIR signal within the distal phalanges of the second and third toes on sagittal STIR sequence (series 11). However no adjacent ulceration is seen. Recommend clinical correlation for any clinical suspicion for infection in the second and third toe. 3. 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 4. Moderate diffuse plantar and dorsal muscle edema. This is nonspecific myositis. 5. Moderate diffuse soft tissue swelling consistent with cellulitis.  MRI left leg: 1. There is a deep ulcer within the distal lateral left calf measuring up to approximately 6.5 cm in craniocaudal dimension and coming close to the distal lateral fibular diaphysis. Nevertheless, the fibular cortex remains intact. No evidence of acute osteomyelitis. 2. There is mild heterogeneity of the more proximal (at mid height) fibular diaphyseal marrow that may include mild nonspecific marrow edema, however no cortical erosion is seen to specifically indicate acute osteomyelitis in this region. 3. Moderate edema within the visualized musculature, greatest within the anterior and deep posterior muscle compartments.     ABI/TBIToday's ABIToday's TBIPrevious ABIPrevious TBI  +-------+-----------+-----------+------------+------------+  Left   0.32       absent                               +-------+-----------+-----------+------------+------------+ ASSESSMENT and PLAN   Left foot and leg ulceration: These are pressure sores, compromised by arterial insufficiency.  This is a very difficult situation because of the patient's underlying comorbidities as well as his vascular disease.  He is at very high risk for limb loss.  I believe he is going to need to undergo angiography prior to any form of amputation whether that be toe amputation or above-knee amputation.  Unfortunately, he  remains very thrombocytopenic with platelet counts around 20.  Based off his prior CT angiogram, the best course of action for revascularization would be a femoral endarterectomy, however I do not think he is going to be a operative candidate.  I would recommend pressure offloading of his foot and leg and continuing antibiotics.  If we can get him more stable regarding his hemoglobin and platelet count, I would consider angiography later this week or possibly next week.  I would also consider palliative care evaluation because I think his risk of mortality is very high with any form of operation whether that be revascularization or amputation.  I will try to speak with the son later tonight and follow-up with his medical team [facility].   Leia Alf, MD, FACS [facility]in Specialists of [facility] [phone] Pager [phone]

## 2022-01-17 NOTE — Progress Notes (Signed)
[facility] consult called in. Discussed case with Dr. Horris Latino this morning.  Julian Hy, DO 01/17/22 9:18 AM [facility]

## 2022-01-18 DIAGNOSIS — A419 Sepsis, unspecified organism: Secondary | ICD-10-CM | POA: Diagnosis not present

## 2022-01-18 DIAGNOSIS — D599 Acquired hemolytic anemia, unspecified: Secondary | ICD-10-CM | POA: Diagnosis not present

## 2022-01-18 DIAGNOSIS — R6521 Severe sepsis with septic shock: Secondary | ICD-10-CM | POA: Diagnosis not present

## 2022-01-18 DIAGNOSIS — G9340 Encephalopathy, unspecified: Secondary | ICD-10-CM | POA: Diagnosis not present

## 2022-01-18 LAB — BPAM RBC
Blood Product Expiration Date: 202307042359
Blood Product Expiration Date: 202307072359
Blood Product Expiration Date: 202307242359
Blood Product Expiration Date: 202307262359
Blood Product Expiration Date: 202307292359
Blood Product Expiration Date: 202307312359
ISSUE DATE / TIME: 202306200427
ISSUE DATE / TIME: 202306200650
ISSUE DATE / TIME: 202306202302
ISSUE DATE / TIME: 202306210622
ISSUE DATE / TIME: 202306211746
ISSUE DATE / TIME: 202306212315
Unit Type and Rh: 1700
Unit Type and Rh: 9500
Unit Type and Rh: 9500
Unit Type and Rh: 9500
Unit Type and Rh: 9500
Unit Type and Rh: 9500

## 2022-01-18 LAB — COMPREHENSIVE METABOLIC PANEL
ALT: 27 U/L (ref 0–44)
AST: 24 U/L (ref 15–41)
Albumin: 3.3 g/dL — ABNORMAL LOW (ref 3.5–5.0)
Alkaline Phosphatase: 61 U/L (ref 38–126)
Anion gap: 8 (ref 5–15)
BUN: 35 mg/dL — ABNORMAL HIGH (ref 8–23)
CO2: 20 mmol/L — ABNORMAL LOW (ref 22–32)
Calcium: 8.3 mg/dL — ABNORMAL LOW (ref 8.9–10.3)
Chloride: 115 mmol/L — ABNORMAL HIGH (ref 98–111)
Creatinine, Ser: 0.39 mg/dL — ABNORMAL LOW (ref 0.61–1.24)
GFR, Estimated: >60 mL/min (ref >60)
Glucose, Bld: 167 mg/dL — ABNORMAL HIGH (ref 70–99)
Potassium: 3.4 mmol/L — ABNORMAL LOW (ref 3.5–5.1)
Sodium: 143 mmol/L (ref 135–145)
Total Bilirubin: 2 mg/dL — ABNORMAL HIGH (ref 0.3–1.2)
Total Protein: 5.4 g/dL — ABNORMAL LOW (ref 6.5–8.1)

## 2022-01-18 LAB — CBC WITH DIFFERENTIAL/PLATELET
Abs Immature Granulocytes: 0.03 10*3/uL (ref 0.00–0.07)
Basophils Absolute: 0 10*3/uL (ref 0.0–0.1)
Basophils Relative: 0 %
Eosinophils Absolute: 0 10*3/uL (ref 0.0–0.5)
Eosinophils Relative: 0 %
HCT: 27.4 % — ABNORMAL LOW (ref 39.0–52.0)
Hemoglobin: 8.8 g/dL — ABNORMAL LOW (ref 13.0–17.0)
Immature Granulocytes: 1 %
Lymphocytes Relative: 15 %
Lymphs Abs: 0.4 10*3/uL — ABNORMAL LOW (ref 0.7–4.0)
MCH: 30 pg (ref 26.0–34.0)
MCHC: 32.1 g/dL (ref 30.0–36.0)
MCV: 93.5 fL (ref 80.0–100.0)
Monocytes Absolute: 0.1 10*3/uL (ref 0.1–1.0)
Monocytes Relative: 4 %
Neutro Abs: 1.9 10*3/uL (ref 1.7–7.7)
Neutrophils Relative %: 80 %
Platelets: 16 10*3/uL — CL (ref 150–400)
RBC: 2.93 MIL/uL — ABNORMAL LOW (ref 4.22–5.81)
RDW: 20.7 % — ABNORMAL HIGH (ref 11.5–15.5)
WBC: 2.4 10*3/uL — ABNORMAL LOW (ref 4.0–10.5)
nRBC: 0 % (ref 0.0–0.2)

## 2022-01-18 LAB — TYPE AND SCREEN
ABO/RH(D): B NEG
Antibody Screen: POSITIVE
Unit division: 0
Unit division: 0
Unit division: 0
Unit division: 0
Unit division: 0
Unit division: 0

## 2022-01-18 LAB — GLUCOSE, CAPILLARY
Glucose-Capillary: 174 mg/dL — ABNORMAL HIGH (ref 70–99)
Glucose-Capillary: 138 mg/dL — ABNORMAL HIGH (ref 70–99)
Glucose-Capillary: 160 mg/dL — ABNORMAL HIGH (ref 70–99)
Glucose-Capillary: 167 mg/dL — ABNORMAL HIGH (ref 70–99)
Glucose-Capillary: 137 mg/dL — ABNORMAL HIGH (ref 70–99)
Glucose-Capillary: 115 mg/dL — ABNORMAL HIGH (ref 70–99)

## 2022-01-18 LAB — MAGNESIUM: Magnesium: 1.5 mg/dL — ABNORMAL LOW (ref 1.7–2.4)

## 2022-01-18 MED ORDER — METOPROLOL TARTRATE 12.5 MG HALF TABLET
12.5000 mg | ORAL_TABLET | Freq: Two times a day (BID) | ORAL | Status: DC
  Administered 2022-01-18: 12.5 mg via ORAL
  Filled 2022-01-18 (×2): qty 1

## 2022-01-18 MED ORDER — POTASSIUM CHLORIDE CRYS ER 20 MEQ PO TBCR
20.0000 meq | EXTENDED_RELEASE_TABLET | Freq: Once | ORAL | Status: AC
Start: 2022-01-18 — End: 2022-01-18
  Administered 2022-01-18: 20 meq via ORAL
  Filled 2022-01-18: qty 1

## 2022-01-18 MED ORDER — MAGNESIUM SULFATE 2 GM/50ML IV SOLN
2.0000 g | Freq: Once | INTRAVENOUS | Status: AC
  Administered 2022-01-18: 2 g via INTRAVENOUS
  Filled 2022-01-18: qty 50

## 2022-01-18 MED ORDER — FUROSEMIDE 40 MG PO TABS
40.0000 mg | ORAL_TABLET | Freq: Every day | ORAL | Status: DC
  Administered 2022-01-18: 40 mg via ORAL
  Filled 2022-01-18: qty 1

## 2022-01-18 MED ORDER — POTASSIUM CHLORIDE CRYS ER 20 MEQ PO TBCR
40.0000 meq | EXTENDED_RELEASE_TABLET | Freq: Once | ORAL | Status: DC
Start: 2022-01-18 — End: 2022-01-18

## 2022-01-18 MED ORDER — FENTANYL 25 MCG/HR TD PT72
1.0000 | MEDICATED_PATCH | TRANSDERMAL | Status: DC
  Administered 2022-01-18 – 2022-01-27 (×4): 1 via TRANSDERMAL
  Filled 2022-01-18 (×4): qty 1

## 2022-01-18 MED ORDER — SODIUM CHLORIDE 0.9% IV SOLUTION: Freq: Once | INTRAVENOUS | Status: AC

## 2022-01-18 MED ORDER — OXYCODONE HCL 5 MG PO TABS
10.0000 mg | ORAL_TABLET | ORAL | Status: DC | PRN
  Administered 2022-01-19 (×3): 10 mg via ORAL
  Filled 2022-01-18 (×3): qty 2

## 2022-01-18 MED ORDER — ALPRAZOLAM 1 MG PO TABS
1.0000 mg | ORAL_TABLET | Freq: Three times a day (TID) | ORAL | Status: DC | PRN
  Administered 2022-01-18 – 2022-01-19 (×4): 1 mg via ORAL
  Filled 2022-01-18 (×4): qty 1

## 2022-01-18 MED ORDER — LOSARTAN POTASSIUM 50 MG PO TABS
50.0000 mg | ORAL_TABLET | Freq: Every day | ORAL | Status: DC
  Administered 2022-01-18: 50 mg via ORAL
  Filled 2022-01-18: qty 1

## 2022-01-18 MED ORDER — METHYLPREDNISOLONE SODIUM SUCC 125 MG IJ SOLR
60.0000 mg | Freq: Every day | INTRAMUSCULAR | Status: AC
  Administered 2022-01-19: 60 mg via INTRAVENOUS
  Filled 2022-01-18: qty 2

## 2022-01-18 MED ORDER — METOPROLOL TARTRATE 5 MG/5ML IV SOLN
5.0000 mg | Freq: Once | INTRAVENOUS | Status: AC
Start: 2022-01-18 — End: 2022-01-18
  Administered 2022-01-18: 5 mg via INTRAVENOUS
  Filled 2022-01-18: qty 5

## 2022-01-18 NOTE — Progress Notes (Signed)
OT Cancellation Note  Patient Details Name: [name]. MRN: [id number] DOB: 02-May-1951   Cancelled Treatment:    Reason Eval/Treat Not Completed: Pain limiting ability to participate pt declined due to pain, will follow and see as able/appropriate.   Jolaine Artist, OT Acute Rehabilitation Services Office [phone]   [name] 01/18/2022, 8:52 AM

## 2022-01-18 NOTE — Progress Notes (Signed)
PT Cancellation Note  Patient Details Name: [name] [name]. MRN: [id number] DOB: June 06, 1951   Cancelled Treatment:    Reason Eval/Treat Not Completed: Pain limiting ability to participate, Patient comes from SNF. Will check back another time. [place] Office [phone] Weekend [phone]    Claretha Cooper 01/18/2022, 9:03 AM

## 2022-01-19 ENCOUNTER — Inpatient Hospital Stay (HOSPITAL_COMMUNITY): Payer: Medicare Other

## 2022-01-19 DIAGNOSIS — I10 Essential (primary) hypertension: Secondary | ICD-10-CM | POA: Diagnosis not present

## 2022-01-19 DIAGNOSIS — D599 Acquired hemolytic anemia, unspecified: Secondary | ICD-10-CM | POA: Diagnosis not present

## 2022-01-19 DIAGNOSIS — Z7189 Other specified counseling: Secondary | ICD-10-CM

## 2022-01-19 DIAGNOSIS — R6521 Severe sepsis with septic shock: Secondary | ICD-10-CM | POA: Diagnosis not present

## 2022-01-19 DIAGNOSIS — A419 Sepsis, unspecified organism: Secondary | ICD-10-CM | POA: Diagnosis not present

## 2022-01-19 DIAGNOSIS — I4891 Unspecified atrial fibrillation: Secondary | ICD-10-CM

## 2022-01-19 DIAGNOSIS — Z515 Encounter for palliative care: Secondary | ICD-10-CM

## 2022-01-19 DIAGNOSIS — G9340 Encephalopathy, unspecified: Secondary | ICD-10-CM | POA: Diagnosis not present

## 2022-01-19 LAB — CBC WITH DIFFERENTIAL/PLATELET
Abs Immature Granulocytes: 0.05 10*3/uL (ref 0.00–0.07)
Basophils Absolute: 0 10*3/uL (ref 0.0–0.1)
Basophils Relative: 0 %
Eosinophils Absolute: 0 10*3/uL (ref 0.0–0.5)
Eosinophils Relative: 0 %
HCT: 30.4 % — ABNORMAL LOW (ref 39.0–52.0)
Hemoglobin: 9.7 g/dL — ABNORMAL LOW (ref 13.0–17.0)
Immature Granulocytes: 1 %
Lymphocytes Relative: 18 %
Lymphs Abs: 0.8 10*3/uL (ref 0.7–4.0)
MCH: 29.8 pg (ref 26.0–34.0)
MCHC: 31.9 g/dL (ref 30.0–36.0)
MCV: 93.3 fL (ref 80.0–100.0)
Monocytes Absolute: 0.3 10*3/uL (ref 0.1–1.0)
Monocytes Relative: 8 %
Neutro Abs: 3 10*3/uL (ref 1.7–7.7)
Neutrophils Relative %: 73 %
Platelets: 19 10*3/uL — CL (ref 150–400)
RBC: 3.26 MIL/uL — ABNORMAL LOW (ref 4.22–5.81)
RDW: 20.7 % — ABNORMAL HIGH (ref 11.5–15.5)
WBC: 4.1 10*3/uL (ref 4.0–10.5)
nRBC: 0.5 % — ABNORMAL HIGH (ref 0.0–0.2)

## 2022-01-19 LAB — BPAM PLATELET PHERESIS
Blood Product Expiration Date: 202306242359
ISSUE DATE / TIME: 202306220951
Unit Type and Rh: 5100

## 2022-01-19 LAB — GLUCOSE, CAPILLARY
Glucose-Capillary: 127 mg/dL — ABNORMAL HIGH (ref 70–99)
Glucose-Capillary: 163 mg/dL — ABNORMAL HIGH (ref 70–99)
Glucose-Capillary: 231 mg/dL — ABNORMAL HIGH (ref 70–99)
Glucose-Capillary: 248 mg/dL — ABNORMAL HIGH (ref 70–99)
Glucose-Capillary: 156 mg/dL — ABNORMAL HIGH (ref 70–99)
Glucose-Capillary: 183 mg/dL — ABNORMAL HIGH (ref 70–99)

## 2022-01-19 LAB — COMPREHENSIVE METABOLIC PANEL
ALT: 28 U/L (ref 0–44)
AST: 29 U/L (ref 15–41)
Albumin: 4.1 g/dL (ref 3.5–5.0)
Alkaline Phosphatase: 74 U/L (ref 38–126)
Anion gap: 9 (ref 5–15)
BUN: 28 mg/dL — ABNORMAL HIGH (ref 8–23)
CO2: 23 mmol/L (ref 22–32)
Calcium: 9.5 mg/dL (ref 8.9–10.3)
Chloride: 110 mmol/L (ref 98–111)
Creatinine, Ser: 0.39 mg/dL — ABNORMAL LOW (ref 0.61–1.24)
GFR, Estimated: >60 mL/min (ref >60)
Glucose, Bld: 176 mg/dL — ABNORMAL HIGH (ref 70–99)
Potassium: 3.3 mmol/L — ABNORMAL LOW (ref 3.5–5.1)
Sodium: 142 mmol/L (ref 135–145)
Total Bilirubin: 3 mg/dL — ABNORMAL HIGH (ref 0.3–1.2)
Total Protein: 6.4 g/dL — ABNORMAL LOW (ref 6.5–8.1)

## 2022-01-19 LAB — PREPARE PLATELET PHERESIS: Unit division: 0

## 2022-01-19 LAB — TSH: TSH: 4.256 u[IU]/mL (ref 0.350–4.500)

## 2022-01-19 LAB — BODY FLUID CULTURE W GRAM STAIN: Gram Stain: NONE SEEN

## 2022-01-19 LAB — T4, FREE: Free T4: 1.03 ng/dL (ref 0.61–1.12)

## 2022-01-19 LAB — VANCOMYCIN, PEAK: Vancomycin Pk: 36 ug/mL (ref 30–40)

## 2022-01-19 LAB — MAGNESIUM: Magnesium: 1.7 mg/dL (ref 1.7–2.4)

## 2022-01-19 LAB — VANCOMYCIN, TROUGH: Vancomycin Tr: 14 ug/mL — ABNORMAL LOW (ref 15–20)

## 2022-01-19 MED ORDER — ALPRAZOLAM 1 MG PO TABS
1.0000 mg | ORAL_TABLET | Freq: Every morning | ORAL | Status: DC
  Administered 2022-01-20: 1 mg via ORAL
  Filled 2022-01-19: qty 1

## 2022-01-19 MED ORDER — POTASSIUM CHLORIDE 20 MEQ PO PACK: 20.0000 meq | PACK | Freq: Once | ORAL | Status: DC

## 2022-01-19 MED ORDER — POTASSIUM CHLORIDE 20 MEQ PO PACK
40.0000 meq | PACK | Freq: Once | ORAL | Status: AC
  Administered 2022-01-19: 40 meq via ORAL
  Filled 2022-01-19: qty 2

## 2022-01-19 MED ORDER — FUROSEMIDE 10 MG/ML IJ SOLN
40.0000 mg | Freq: Every day | INTRAMUSCULAR | Status: DC
Start: 2022-01-20 — End: 2022-01-23
  Administered 2022-01-20 – 2022-01-23 (×4): 40 mg via INTRAVENOUS
  Filled 2022-01-19 (×4): qty 4

## 2022-01-19 MED ORDER — OXYCODONE HCL 5 MG PO TABS
20.0000 mg | ORAL_TABLET | Freq: Four times a day (QID) | ORAL | Status: DC | PRN
  Administered 2022-01-19 – 2022-01-30 (×35): 20 mg via ORAL
  Filled 2022-01-19 (×35): qty 4

## 2022-01-19 MED ORDER — DILTIAZEM HCL-DEXTROSE 125-5 MG/125ML-% IV SOLN (PREMIX)
5.0000 mg/h | INTRAVENOUS | Status: DC
  Administered 2022-01-19: 5 mg/h via INTRAVENOUS
  Administered 2022-01-19 – 2022-01-20 (×4): 15 mg/h via INTRAVENOUS
  Administered 2022-01-21 – 2022-01-23 (×4): 10 mg/h via INTRAVENOUS
  Filled 2022-01-19 (×9): qty 125

## 2022-01-19 MED ORDER — DILTIAZEM LOAD VIA INFUSION
10.0000 mg | Freq: Once | INTRAVENOUS | Status: AC
  Administered 2022-01-19: 10 mg via INTRAVENOUS
  Filled 2022-01-19: qty 10

## 2022-01-19 MED ORDER — ALPRAZOLAM 1 MG PO TABS
1.0000 mg | ORAL_TABLET | Freq: Every day | ORAL | Status: DC
  Administered 2022-01-20: 1 mg via ORAL
  Filled 2022-01-19: qty 1

## 2022-01-19 MED ORDER — SODIUM CHLORIDE 0.9 % IV BOLUS: 500.0000 mL | Freq: Once | INTRAVENOUS | Status: DC

## 2022-01-19 MED ORDER — FUROSEMIDE 10 MG/ML IJ SOLN
40.0000 mg | Freq: Once | INTRAMUSCULAR | Status: AC
  Administered 2022-01-19: 40 mg via INTRAVENOUS
  Filled 2022-01-19: qty 4

## 2022-01-19 MED ORDER — METOPROLOL TARTRATE 5 MG/5ML IV SOLN
2.5000 mg | Freq: Once | INTRAVENOUS | Status: AC
  Administered 2022-01-19: 2.5 mg via INTRAVENOUS
  Filled 2022-01-19: qty 5

## 2022-01-19 MED ORDER — SODIUM CHLORIDE 0.9 % IV BOLUS
500.0000 mL | Freq: Once | INTRAVENOUS | Status: AC
  Administered 2022-01-19: 500 mL via INTRAVENOUS

## 2022-01-19 MED ORDER — POTASSIUM CHLORIDE 20 MEQ PO PACK
20.0000 meq | PACK | Freq: Once | ORAL | Status: DC
Start: 2022-01-19 — End: 2022-01-19

## 2022-01-19 MED ORDER — MAGNESIUM SULFATE 2 GM/50ML IV SOLN
2.0000 g | Freq: Once | INTRAVENOUS | Status: AC
  Administered 2022-01-19: 2 g via INTRAVENOUS
  Filled 2022-01-19: qty 50

## 2022-01-19 NOTE — Progress Notes (Signed)
Clarified actual home xanax schedule- see new orders

## 2022-01-19 NOTE — Progress Notes (Signed)
PT Cancellation Note  Patient Details Name: [name]. MRN: [id number] DOB: 09/04/1950   Cancelled Treatment:    Reason Eval/Treat Not Completed: Medical issues which prohibited therapy, HR elevated. Will check back another day.  Blanchard Kelch PT Acute Rehabilitation Services Office [phone] Weekend [phone]    Rada Hay 01/19/2022, 11:11 AM

## 2022-01-19 NOTE — Consult Note (Signed)
Consultation Note Date: 01/19/2022   Patient Name: [name] [name].  DOB: 1951/06/22  MRN: [id number]  Age / Sex: 71 y.o., male  PCP: [name], FNP Referring Physician: Alma Friendly, MD  Reason for Consultation: Establishing goals of care and Pain control  HPI/Patient Profile: 72 y.o. male  with past medical history of hepatitis C, cirrhosis, hepatic encephalopathy, phantom limb syndrome s/p R AKA, chronic oxycodone admitted on 01/15/2022 with confusion, hypotension, abd pain from [facility] related to septic shock due to L toe infection vs pressure ulcer infection??. Recent hospitalization 12/18/21-01/04/22 with septic shock, rhabdomyolysis vs necrotizing fasciitis, PVD/PAD with severe LLE circulation, SVT, hepatic encephalopathy with decompensated liver failure, AKI, splenic infarction, and chronic pain. Palliative followed throughout last hospitalization assisting with goals of care (remains full code/full scope) as well as guidance with pain management. May be facing another amputation vs revascularization procedure but may not be stable for surgery and invasive management. Surgical intervention of any kind would be risky per vascular notes.   Clinical Assessment and Goals of Care: I met today with [name]. Records reviewed from current hospitalization and last as well as previous palliative notes. [name] is lying in bed, alert, oriented and very pleasant. He recalls his visits with palliative providers during previous admission. He reports that he had a difficult night last night but he feels that they have sorted out his pain and anxiety medication and is feeling much better this afternoon.   [name] shares with me his anxiety about his current condition. He is very anxious to have some options to treat his condition to allow him to live longer. He questions why surgery would not be an  option "if I'm going to die anyway without it?" I explained how surgeons and physicians don't want to do thinks to him that can cause more pain and suffering if they do not believe that it is ultimately going to be beneficial to him and he expresses understanding. He shares with me about his life and losing his son at a young age. He shares about his other sons and grandchildren and how proud he is of what they have accomplished. He tells me about his multiple health complications and scares over the years. He is clear about his desire to continue with full code and full scope treatment with hopes of living as long as possible. He talks of his faith and trusting God to help him to be healed and live longer. He also places full trust in his son [name] as his HCPOA. He was very tearful throughout conversation. He reports that his son should be here tomorrow morning so I will stop by for follow up in the morning. He expresses that he is tired and wishes to rest at this time.   All questions/concerns addressed. Emotional support provided.   Primary Decision Maker PATIENT HCPOA son [name]    SUMMARY OF RECOMMENDATIONS   - Full scope/full code at this time - Hopeful that he will be eligible for intervention  to prolong life - Will follow up tomorrow   Code Status/Advance Care Planning: Full code   Symptom Management:  Per attending, vascular, cardiology.  No changes to pain/anxiety regimen at this time as he reports this is improved and working for him now.   Palliative Prophylaxis:  Aspiration, Bowel Regimen, Delirium Protocol, Frequent Pain Assessment, Palliative Wound Care, and Turn Reposition  Additional Recommendations (Limitations, Scope, Preferences): Full Scope Treatment  Prognosis:  Overall prognosis is poor with worsening vascular changes and poor candidate for surgery.   Discharge Planning: To Be Determined      Primary Diagnoses: Present on Admission:  Acute encephalopathy   Septic shock ([place])   I have reviewed the medical record, interviewed the patient and family, and examined the patient. The following aspects are pertinent.  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   Squamous cell carcinoma of skin 05/21/2018   R mid volar forearm   Thyroid disease    UROLITHIASIS, HX OF 12/23/2007   Social History   Socioeconomic History   Marital status: Widowed    Spouse name: Not on file   Number of children: Not on file   Years of education: Not on file   Highest education level: Not on file  Occupational History   Occupation: Disabled  Tobacco Use   Smoking status: Never   Smokeless tobacco: Never  Substance and Sexual Activity   Alcohol use: Not Currently    Alcohol/week: 0.0 standard drinks of alcohol   Drug use: No   Sexual activity: Not on file  Other Topics Concern   Not on file  Social History Narrative   Widowed 1997   Lives alone   Lost a son in his arms in 64.    3 sons   Social Determinants of Health   Financial Resource Strain: Low Risk  (08/08/2020)   Overall Financial Resource Strain (CARDIA)    Difficulty of Paying Living Expenses: Not hard at all  Food Insecurity: No Food Insecurity (08/08/2020)   Hunger Vital Sign    Worried About Running Out of Food in the Last Year: Never true    March ARB in the Last Year: Never true  Transportation Needs: No Transportation Needs (08/08/2020)   PRAPARE - Hydrologist (Medical): No    Lack of Transportation  (Non-Medical): No  Physical Activity: Not on file  Stress: No Stress Concern Present (08/08/2020)   [place]    Feeling of Stress : Not at all  Social Connections: Unknown (08/08/2020)   Social Connection and Isolation Panel [NHANES]    Frequency of Communication with Friends and Family: More than three times a week    Frequency of Social Gatherings with Friends and Family: More than three times a week    Attends Religious Services: Not on file    Active Member of Clubs or Organizations: Not on file    Attends  Archivist Meetings: Not on file    Marital Status: Widowed   Family History  Problem Relation Age of Onset   Heart disease Mother    Lung cancer Mother 72   Heart attack Paternal Grandmother    Heart attack Paternal Grandfather    Liver cancer Neg Hx    Scheduled Meds:  sodium chloride   Intravenous Once   Chlorhexidine Gluconate Cloth  6 each Topical Daily   fentaNYL  1 patch Transdermal Q72H   insulin aspart  0-15 Units Subcutaneous Q4H   lactulose  30 g Oral TID   levothyroxine  50 mcg Oral QAC breakfast   pantoprazole (PROTONIX) IV  40 mg Intravenous Q12H   sodium chloride flush  10-40 mL Intracatheter Q12H   Continuous Infusions:  sodium chloride     albumin human Stopped (01/19/22 0703)   ceFEPime (MAXIPIME) IV Stopped (01/19/22 0506)   diltiazem (CARDIZEM) infusion 15 mg/hr (01/19/22 1035)   metronidazole Stopped (01/19/22 1022)   vancomycin 200 mL/hr at 01/19/22 1035   PRN Meds:.ALPRAZolam, docusate sodium, mouth rinse, oxyCODONE, phenol, polyethylene glycol, sodium chloride flush Allergies  Allergen Reactions   Clindamycin Dermatitis   Unasyn [Ampicillin-Sulbactam Sodium] Rash    Diffuse whole body rash 12/28/21 Tolerated cefepime 01/15/22   Review of Systems  Physical Exam Vitals and nursing note reviewed.  Constitutional:      General: He is not in acute distress.     Appearance: He is ill-appearing.  Cardiovascular:     Rate and Rhythm: Tachycardia present.  Pulmonary:     Effort: No tachypnea, accessory muscle usage or respiratory distress.  Abdominal:     Palpations: Abdomen is soft.  Skin:    Comments: R AKA L wound dressed and not assessed by me today  Neurological:     Mental Status: He is alert and oriented to person, place, and time.     Vital Signs: BP 124/75   Pulse (!) 138   Temp 97.8 F (36.6 C) (Oral)   Resp (!) 30   Ht 6' (1.829 m)   Wt 96 kg   SpO2 96%   BMI 28.70 kg/m  Pain Scale: 0-10   Pain Score: 7    SpO2: SpO2: 96 % O2 Device:SpO2: 96 % O2 Flow Rate: .O2 Flow Rate (L/min): 2 L/min  IO: Intake/output summary:  Intake/Output Summary (Last 24 hours) at 01/19/2022 1130 Last data filed at 01/19/2022 1035 Gross per 24 hour  Intake 1806.67 ml  Output 1510 ml  Net 296.67 ml    LBM: Last BM Date : 01/18/22 Baseline Weight: Weight: 101.6 kg (per record) Most recent weight: Weight: 96 kg     Palliative Assessment/Data:     Time In: 1510  Time Total: 75 min  Greater than 50%  of this time was spent counseling and coordinating care related to the above assessment and plan.  Signed by: Vinie Sill, NP Palliative Medicine Team Pager # [phone] (M-F 8a-5p) Team Phone # [phone] (Nights/Weekends)

## 2022-01-19 NOTE — Progress Notes (Signed)
    OVERNIGHT PROGRESS REPORT  Notified by ICU staff of HR accelerating into the 190s.  Patient was unaware. IV lopressor ordered, NS IVF bolus ordered.  12- Lead EKG obtained in 130s  HR had returned to the 100 +/- rate by the time of bedside visit after administration of ordered IV Lopressor and during IVF bolus.  No obvious or stated distress at this time.   Chinita Greenland MSNA MSN ACNPC-AG Acute Care Nurse Practitioner [facility]

## 2022-01-20 DIAGNOSIS — G9340 Encephalopathy, unspecified: Secondary | ICD-10-CM | POA: Diagnosis not present

## 2022-01-20 DIAGNOSIS — A419 Sepsis, unspecified organism: Secondary | ICD-10-CM | POA: Diagnosis not present

## 2022-01-20 DIAGNOSIS — D599 Acquired hemolytic anemia, unspecified: Secondary | ICD-10-CM | POA: Diagnosis not present

## 2022-01-20 DIAGNOSIS — R6521 Severe sepsis with septic shock: Secondary | ICD-10-CM | POA: Diagnosis not present

## 2022-01-20 LAB — GLUCOSE, CAPILLARY
Glucose-Capillary: 128 mg/dL — ABNORMAL HIGH (ref 70–99)
Glucose-Capillary: 144 mg/dL — ABNORMAL HIGH (ref 70–99)
Glucose-Capillary: 144 mg/dL — ABNORMAL HIGH (ref 70–99)
Glucose-Capillary: 163 mg/dL — ABNORMAL HIGH (ref 70–99)
Glucose-Capillary: 183 mg/dL — ABNORMAL HIGH (ref 70–99)
Glucose-Capillary: 192 mg/dL — ABNORMAL HIGH (ref 70–99)

## 2022-01-20 LAB — CULTURE, BLOOD (ROUTINE X 2)
Culture: NO GROWTH
Culture: NO GROWTH
Special Requests: ADEQUATE

## 2022-01-20 LAB — COMPREHENSIVE METABOLIC PANEL
ALT: 21 U/L (ref 0–44)
AST: 16 U/L (ref 15–41)
Albumin: 4 g/dL (ref 3.5–5.0)
Alkaline Phosphatase: 60 U/L (ref 38–126)
Anion gap: 9 (ref 5–15)
BUN: 24 mg/dL — ABNORMAL HIGH (ref 8–23)
CO2: 22 mmol/L (ref 22–32)
Calcium: 9.5 mg/dL (ref 8.9–10.3)
Chloride: 107 mmol/L (ref 98–111)
Creatinine, Ser: 0.45 mg/dL — ABNORMAL LOW (ref 0.61–1.24)
GFR, Estimated: >60 mL/min (ref >60)
Glucose, Bld: 164 mg/dL — ABNORMAL HIGH (ref 70–99)
Potassium: 3.3 mmol/L — ABNORMAL LOW (ref 3.5–5.1)
Sodium: 138 mmol/L (ref 135–145)
Total Bilirubin: 2.5 mg/dL — ABNORMAL HIGH (ref 0.3–1.2)
Total Protein: 6.1 g/dL — ABNORMAL LOW (ref 6.5–8.1)

## 2022-01-20 LAB — CBC WITH DIFFERENTIAL/PLATELET
Abs Immature Granulocytes: 0.03 10*3/uL (ref 0.00–0.07)
Basophils Absolute: 0 10*3/uL (ref 0.0–0.1)
Basophils Relative: 0 %
Eosinophils Absolute: 0 10*3/uL (ref 0.0–0.5)
Eosinophils Relative: 1 %
HCT: 29.5 % — ABNORMAL LOW (ref 39.0–52.0)
Hemoglobin: 9.5 g/dL — ABNORMAL LOW (ref 13.0–17.0)
Immature Granulocytes: 1 %
Lymphocytes Relative: 23 %
Lymphs Abs: 0.8 10*3/uL (ref 0.7–4.0)
MCH: 29.9 pg (ref 26.0–34.0)
MCHC: 32.2 g/dL (ref 30.0–36.0)
MCV: 92.8 fL (ref 80.0–100.0)
Monocytes Absolute: 0.3 10*3/uL (ref 0.1–1.0)
Monocytes Relative: 8 %
Neutro Abs: 2.2 10*3/uL (ref 1.7–7.7)
Neutrophils Relative %: 67 %
Platelets: 18 10*3/uL — CL (ref 150–400)
RBC: 3.18 MIL/uL — ABNORMAL LOW (ref 4.22–5.81)
RDW: 20.6 % — ABNORMAL HIGH (ref 11.5–15.5)
WBC: 3.3 10*3/uL — ABNORMAL LOW (ref 4.0–10.5)
nRBC: 0 % (ref 0.0–0.2)

## 2022-01-20 LAB — MAGNESIUM: Magnesium: 1.8 mg/dL (ref 1.7–2.4)

## 2022-01-20 MED ORDER — POTASSIUM CHLORIDE CRYS ER 20 MEQ PO TBCR
40.0000 meq | EXTENDED_RELEASE_TABLET | Freq: Once | ORAL | Status: AC
Start: 2022-01-20 — End: 2022-01-20
  Administered 2022-01-20: 40 meq via ORAL
  Filled 2022-01-20: qty 2

## 2022-01-20 MED ORDER — ALPRAZOLAM 1 MG PO TABS
1.0000 mg | ORAL_TABLET | Freq: Once | ORAL | Status: AC
Start: 2022-01-20 — End: 2022-01-20
  Administered 2022-01-20: 1 mg via ORAL
  Filled 2022-01-20: qty 1

## 2022-01-20 NOTE — Progress Notes (Signed)
Palliative:  HPI: 71 y.o. male  with past medical history of hepatitis C, cirrhosis, hepatic encephalopathy, phantom limb syndrome s/p R AKA, chronic oxycodone admitted on 01/15/2022 with confusion, hypotension, abd pain from [facility] related to septic shock due to L toe infection vs pressure ulcer infection??. Recent hospitalization 12/18/21-01/04/22 with septic shock, rhabdomyolysis vs necrotizing fasciitis, PVD/PAD with severe LLE circulation, SVT, hepatic encephalopathy with decompensated liver failure, AKI, splenic infarction, and chronic pain. Palliative followed throughout last hospitalization assisting with goals of care (remains full code/full scope) as well as guidance with pain management. May be facing another amputation vs revascularization procedure but may not be stable for surgery and invasive management. Surgical intervention of any kind would be risky per vascular notes.   I met today with Mr. Giraldo and son, Ree Kida, at bedside. I introduced myself from palliative care and Ree Kida recalls past visits with Dr. Romie Minus. Mr. Masley reports that he is feeling well this morning and had a good 8 hours of sleep last night. He feels better today. He expresses even more resign that he can improve and have more time to live. He does not wish to discuss goals of care further but wants to focus on the joys he has coming today. He is looking forward to a visit from his younger son and dinner with his brother. He is hopeful for improvement and that he will be able to pursue some level of intervention to continue to prolong his life and give him more time with his loved ones.   All questions/concerns addressed. Emotional support provided.   Exam: Alert, oriented. Ill-appearing. HR 120s. Breathing regular, unlabored. Abd soft. R AKA. L wound not assessed but with clean dressing.   Plan: - Continue full code/full scope at patient request.  - No changes to pain/anxiety medication regimen at this time.  - I  will be off service until Tuesday but my colleagues will be available as needed and may be reached at [phone].   25 min  Yong Channel, NP Palliative Medicine Team Pager [phone] (Please see [email or web address] for schedule) Team Phone [phone]    Greater than 50%  of this time was spent counseling and coordinating care related to the above assessment and plan

## 2022-01-20 NOTE — Progress Notes (Signed)
**Note Derek-Identified via Obfuscation** PROGRESS NOTE  Derek Blanch Fuelling Jr. AYT:[id number] DOB: May 08, 1951 DOA: 01/15/2022 PCP: Derek Grana, FNP  HPI/Recap of past 24 hours: [name] is a 71 year old gentleman with a history of hepatitis C, cirrhosis with history hepatic encephalopathy, phantom limb syndrome and chronic oxycodone use who presented with confusion, low blood pressure, abdominal pain from [facility] health care where he has been in rehabilitation since his most recent hospitalization.  He was discharged on 01/04/22, after admission for septic shock due to acute cholecystitis requiring percutaneous biliary drain, acute blood loss anemia due to hemolytic anemia and GI bleed, left thigh rhabdomyolysis and myonecrosis, AKI. Reported that he was having worsening diffuse pain and confusion. Presented to the ED hypotensive with a blood pressure 75/43. PCCM admitted and stabilized. TRH assumed care on 01/17/22.    Today, pt reported having a goodnight, denies any new complaints. Son at bedside. Still in afib with RVR, asymptomatic.   Assessment/Plan: Principal Problem:   Acute encephalopathy Active Problems:   Septic shock (HCC)   Septic shock due to likely L toe infection Vs multiple pressure ulcers BP improved off pressors Currently afebrile, with no leukocytosis Blood cultures remain negative so far MRI of LLE, with no evidence of acute osteomyelitis Percutaneous biliary drain is in appropriate position & gallbladder was decompressed; no WBCs in biliary fluid Orthopedics consulted, awaiting recs Vascular surgery consulted for left foot, appreciate recs, not a great surgical candidate due to coagulopathy WOC consult Continue cefepime, vancomycin, Flagyl   Acute metabolic encephalopathy Improved Likely due to sepsis, AKI, hyperammonemia History of hepatic encephalopathy Continue lactulose   Acute on chronic anemia- hemoglobin ~9 on 6/13 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 Transfuse for hemoglobin less than 7 or hemodynamically significant bleeding; reviewed heme-onc notes from recent admission--no role for steroids with his known autoimmune hemolytic anemia Continue empiric high-dose PPI Monitor for potential sources of bleeding Daily CBC  ??Episode of A-fib with RVR intermittently with sinus tachycardia Hx of SVT Started on Cardizem drip, titrate pending heart rate Cardiology consulted, appreciate recs Monitor closely, not a candidate for anticoagulation if ??A-fib remains persistent  Chronic pain syndrome Restarting PTA pain regimen  Hypertension BP has remained stable Held losartan while on Cardizem drip Continue Lasix daily for now   Acute on chronic thrombocytopenia History of cirrhosis Transfuse for platelets less than 10 or <50 if bleeding  Gave 1 unit of platelets for plt at 12,000 on 6/21 improved to 16,000   AKI, prerenal versus ATN from AKI Resolved Strict I's/O, renally dose meds and avoid nephrotoxic meds Continue Foley catheter for now  Decompensated cirrhosis potentially due to acute infection Continue lactulose Continue supportive care, monitoring LFTs  Hyperglycemia Worsened by steroid use, tapering of steroids SSI, hypoglycemic protocol  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 purple, red (Active)  12/18/21 1530  Location: Foot  Location Orientation: Anterior;Left;Lateral  Staging: Deep Tissue Pressure Injury - Purple or maroon localized area of discolored  intact skin or blood-filled blister due to damage of underlying soft tissue from pressure and/or shear.  Wound  Description (Comments): purple, red  Present on Admission: Yes  Dressing Type Gauze (Comment) 01/20/22 0800     Pressure Injury 01/03/22 Toe (Comment  which one) Anterior;Left Unstageable - Full thickness tissue loss in which the base of the injury is covered by slough (yellow, tan, gray, green or brown) and/or eschar (tan, brown or black) in the wound bed. (Active)  01/03/22 1000  Location: Toe (Comment  which one)  Location Orientation: Anterior;Left  Staging: Unstageable - Full thickness tissue loss in which the base of the injury is covered by slough (yellow, tan, gray, green or brown) and/or eschar (tan, brown or black) in the wound bed.  Wound Description (Comments):   Present on Admission:   Dressing Type Gauze (Comment) 01/20/22 0800     Pressure Injury 01/03/22 Sacrum Mid Unstageable - Full thickness tissue loss in which the base of the injury is covered by slough (yellow, tan, gray, green or brown) and/or eschar (tan, brown or black) in the wound bed. (Active)  01/03/22 1000  Location: Sacrum  Location Orientation: Mid  Staging: Unstageable - Full thickness tissue loss in which the base of the injury is covered by slough (yellow, tan, gray, green or brown) and/or eschar (tan, brown or black) in the wound bed.  Wound Description (Comments):   Present on Admission:   Dressing Type Foam - Lift dressing to assess site every shift 01/20/22 0800     Pressure Injury 01/03/22 Coccyx Mid Unstageable - Full thickness tissue loss in which the base of the injury is covered by slough (yellow, tan, gray, green or brown) and/or eschar (tan, brown or black) in the wound bed. (Active)  01/03/22 1000  Location: Coccyx  Location Orientation: Mid  Staging: Unstageable - Full thickness tissue loss in which the base of the injury is covered by slough (yellow, tan, gray, green or brown) and/or  eschar (tan, brown or black) in the wound bed.  Wound Description (Comments):   Present on Admission:   Dressing Type Foam - Lift dressing to assess site every shift 01/20/22 0800     Pressure Injury 01/03/22 Heel Left Deep Tissue Pressure Injury - Purple or maroon localized area of discolored intact skin or blood-filled blister due to damage of underlying soft tissue from pressure and/or shear. (Active)  01/03/22 1000  Location: Heel  Location Orientation: Left  Staging: Deep Tissue Pressure Injury - Purple or maroon localized area of discolored intact skin or blood-filled blister due to damage of underlying soft tissue from pressure and/or shear.  Wound Description (Comments):   Present on Admission:   Dressing Type Foam - Lift dressing to assess site every shift 01/16/22 2000     Pressure Injury 01/15/22 Pretibial Distal;Left Unstageable - Full thickness tissue loss in which the base of the injury is covered by slough (yellow, tan, gray, green or brown) and/or eschar (tan, brown or black) in the wound bed. unstageable wound with  (Active)  01/15/22 2010  Location: Pretibial  Location Orientation: Distal;Left  Staging: Unstageable - Full thickness tissue loss in which the base of the injury is covered by slough (yellow, tan, gray, green or brown) and/or eschar (tan, brown or black) in the wound bed.  Wound Description (Comments): unstageable wound with drainage  Present on Admission: Yes  Dressing Type Foam - Lift dressing to assess site every shift 01/20/22 0800         Estimated body mass index is 28.7 kg/m as calculated from the following:   Height as of  this encounter: 6' (1.829 m).   Weight as of this encounter: 96 kg.     Code Status: Full  Family Communication: Discussed with son at bedside  Disposition Plan: Status is: Inpatient Remains inpatient appropriate because: Level of care      Consultants: PCCM Orthopedics Vascular  surgery Cardiology  Procedures: None  Antimicrobials: Vancomycin Cefepime Metronidazole  DVT prophylaxis: SCDs due to autoimmune hemolytic anemia, thrombocytopenia   Objective: Vitals:   01/20/22 1300 01/20/22 1400 01/20/22 1500 01/20/22 1600  BP: 125/69 (!) 141/81 (!) 144/62 129/63  Pulse: (!) 41 96 (!) 101 80  Resp: 19 (!) 21 (!) 21 17  Temp:      TempSrc:      SpO2: 96% 94% 97% 94%  Weight:      Height:        Intake/Output Summary (Last 24 hours) at 01/20/2022 1653 Last data filed at 01/20/2022 1344 Gross per 24 hour  Intake 1130.95 ml  Output 1817 ml  Net -686.05 ml   Filed Weights   01/16/22 0453 01/17/22 0500 01/19/22 0500  Weight: 90.5 kg 100.6 kg 96 kg    Exam: General: Chronically ill-appearing, NAD, awake, alert Cardiovascular: S1, S2 present Respiratory: Coarse breath sounds bilaterally Abdomen: Soft, nontender, nondistended, bowel sounds present Musculoskeletal: Right AKA, no bilateral pedal edema noted Skin: Noted multiple pressure ulcers Psychiatry: Fair mood     Data Reviewed: CBC: Recent Labs  Lab 01/15/22 1416 01/15/22 1900 01/16/22 1505 01/17/22 0434 01/17/22 1130 01/18/22 0355 01/18/22 1406 01/19/22 0448 01/20/22 0456  WBC 4.2   < > 2.2*  --   --  1.9* 2.4* 4.1 3.3*  NEUTROABS 2.4  --   --   --   --  1.5* 1.9 3.0 2.2  HGB 5.8*   < > 6.7*   < > 6.3* 8.1* 8.8* 9.7* 9.5*  HCT 18.1*   < > 20.5*   < > 19.4* 25.4* 27.4* 30.4* 29.5*  MCV 98.9   < > 97.6  --   --  94.1 93.5 93.3 92.8  PLT 35*   < > 25*  --   --  12* 16* 19* 18*   < > = values in this interval not displayed.   Basic Metabolic Panel: Recent Labs  Lab 01/16/22 0315 01/17/22 0434 01/17/22 1130 01/18/22 0355 01/19/22 0448 01/20/22 0456  NA 142  --  138 143 142 138  K 3.4*  --  2.7* 3.4* 3.3* 3.3*  CL 105  --  111 115* 110 107  CO2 24  --  19* 20* 23 22  GLUCOSE 178*  --  292* 167* 176* 164*  BUN 84*  --  34* 35* 28* 24*  CREATININE 1.00 0.43* 0.48* 0.39*  0.39* 0.45*  CALCIUM 8.7*  --  7.5* 8.3* 9.5 9.5  MG 1.6*  --  1.7 1.5* 1.7 1.8  PHOS 4.3  --   --   --   --   --    GFR: Estimated Creatinine Clearance: 100.3 mL/min (A) (by C-G formula based on SCr of 0.45 mg/dL (L)). Liver Function Tests: Recent Labs  Lab 01/16/22 0315 01/17/22 1130 01/18/22 0355 01/19/22 0448 01/20/22 0456  AST 48* 28 24 29 16   ALT 36 28 27 28 21   ALKPHOS 95 66 61 74 60  BILITOT 2.3* 2.0* 2.0* 3.0* 2.5*  PROT 5.9* 5.2* 5.4* 6.4* 6.1*  ALBUMIN 2.7* 2.8* 3.3* 4.1 4.0   No results for input(s): "LIPASE", "AMYLASE" in the last 168  hours. Recent Labs  Lab 01/15/22 1416  AMMONIA 64*   Coagulation Profile: Recent Labs  Lab 01/15/22 1416 01/15/22 1900 01/16/22 0315  INR 1.4* >10.0* 1.4*   Cardiac Enzymes: No results for input(s): "CKTOTAL", "CKMB", "CKMBINDEX", "TROPONINI" in the last 168 hours. BNP (last 3 results) No results for input(s): "PROBNP" in the last 8760 hours. HbA1C: No results for input(s): "HGBA1C" in the last 72 hours. CBG: Recent Labs  Lab 01/19/22 2326 01/20/22 0412 01/20/22 0805 01/20/22 1138 01/20/22 1642  GLUCAP 183* 144* 144* 192* 128*   Lipid Profile: No results for input(s): "CHOL", "HDL", "LDLCALC", "TRIG", "CHOLHDL", "LDLDIRECT" in the last 72 hours. Thyroid Function Tests: Recent Labs    01/19/22 0754  TSH 4.256  FREET4 1.03   Anemia Panel: No results for input(s): "VITAMINB12", "FOLATE", "FERRITIN", "TIBC", "IRON", "RETICCTPCT" in the last 72 hours. Urine analysis:    Component Value Date/Time   COLORURINE AMBER (A) 01/15/2022 1412   APPEARANCEUR HAZY (A) 01/15/2022 1412   LABSPEC 1.012 01/15/2022 1412   PHURINE 5.0 01/15/2022 1412   GLUCOSEU NEGATIVE 01/15/2022 1412   GLUCOSEU NEGATIVE 01/06/2015 0912   HGBUR NEGATIVE 01/15/2022 1412   BILIRUBINUR NEGATIVE 01/15/2022 1412   KETONESUR NEGATIVE 01/15/2022 1412   PROTEINUR NEGATIVE 01/15/2022 1412   UROBILINOGEN 1.0 01/06/2015 0912   NITRITE NEGATIVE  01/15/2022 1412   LEUKOCYTESUR NEGATIVE 01/15/2022 1412   Sepsis Labs: @LABRCNTIP (procalcitonin:4,lacticidven:4)  ) Recent Results (from the past 240 hour(s))  Blood Culture (routine x 2)     Status: None   Collection Time: 01/15/22  2:11 PM   Specimen: BLOOD  Result Value Ref Range Status   Specimen Description   Final    BLOOD BLOOD LEFT HAND Performed at [facility], [address] [place], [place] [address]    Special Requests   Final    BOTTLES DRAWN AEROBIC AND ANAEROBIC Blood Culture results may not be optimal due to an inadequate volume of blood received in culture bottles Performed at [facility], [address], [place], [place] [address]    Culture   Final    NO GROWTH 5 DAYS Performed at [facility], [address] [place], [place] [address]    Report Status 01/20/2022 FINAL  Final  Urine Culture     Status: None   Collection Time: 01/15/22  2:12 PM   Specimen: In/Out Cath Urine  Result Value Ref Range Status   Specimen Description   Final    IN/OUT CATH URINE Performed at [facility], [address] [place], [place] [address]    Special Requests   Final    NONE Performed at [facility], [address], [place], [place] [address]    Culture   Final    NO GROWTH Performed at [facility], [address], [place], [place] [address]    Report Status 01/16/2022 FINAL  Final  Resp Panel by RT-PCR (Flu A&B, Covid) Anterior Nasal Swab     Status: None   Collection Time: 01/15/22  3:22 PM   Specimen: Anterior Nasal Swab  Result Value Ref Range Status   SARS Coronavirus 2 by RT PCR NEGATIVE NEGATIVE Final    Comment: (NOTE) SARS-CoV-2 target nucleic acids are NOT DETECTED.  The SARS-CoV-2 RNA is generally detectable in upper respiratory specimens during the acute phase of infection. The lowest concentration of SARS-CoV-2 viral copies this assay can detect is 138  copies/mL. A negative result does  not preclude SARS-Cov-2 infection and should not be used as the sole basis for treatment or other patient management decisions. A negative result may occur with  improper specimen collection/handling, submission of specimen other than nasopharyngeal swab, presence of viral mutation(s) within the areas targeted by this assay, and inadequate number of viral copies(<138 copies/mL). A negative result must be combined with clinical observations, patient history, and epidemiological information. The expected result is Negative.  Fact Sheet for Patients:  BloggerCourse.com  Fact Sheet for Healthcare Providers:  SeriousBroker.it  This test is no t yet approved or cleared by the [place] FDA and  has been authorized for detection and/or diagnosis of SARS-CoV-2 by FDA under an Emergency Use Authorization (EUA). This EUA will remain  in effect (meaning this test can be used) for the duration of the COVID-19 declaration under Section 564(b)(1) of the Act, 21 U.S.C.section 360bbb-3(b)(1), unless the authorization is terminated  or revoked sooner.       Influenza A by PCR NEGATIVE NEGATIVE Final   Influenza B by PCR NEGATIVE NEGATIVE Final    Comment: (NOTE) The Xpert Xpress SARS-CoV-2/FLU/RSV plus assay is intended as an aid in the diagnosis of influenza from Nasopharyngeal swab specimens and should not be used as a sole basis for treatment. Nasal washings and aspirates are unacceptable for Xpert Xpress SARS-CoV-2/FLU/RSV testing.  Fact Sheet for Patients: BloggerCourse.com  Fact Sheet for Healthcare Providers: SeriousBroker.it  This test is not yet approved or cleared by the [place] FDA and has been authorized for detection and/or diagnosis of SARS-CoV-2 by FDA under an Emergency Use Authorization (EUA). This EUA will remain in effect (meaning  this test can be used) for the duration of the COVID-19 declaration under Section 564(b)(1) of the Act, 21 U.S.C. section 360bbb-3(b)(1), unless the authorization is terminated or revoked.  Performed at [facility], [address] [place], [place] [address]   MRSA Next Gen by PCR, Nasal     Status: None   Collection Time: 01/15/22  8:21 PM   Specimen: Nasal Mucosa; Nasal Swab  Result Value Ref Range Status   MRSA by PCR Next Gen NOT DETECTED NOT DETECTED Final    Comment: (NOTE) The GeneXpert MRSA Assay (FDA approved for NASAL specimens only), is one component of a comprehensive MRSA colonization surveillance program. It is not intended to diagnose MRSA infection nor to guide or monitor treatment for MRSA infections. Test performance is not FDA approved in patients less than 3 years old. Performed at [facility], [address] [place], [place] [address]   Blood Culture (routine x 2)     Status: None   Collection Time: 01/15/22  9:31 PM   Specimen: BLOOD RIGHT HAND  Result Value Ref Range Status   Specimen Description   Final    BLOOD RIGHT HAND Performed at [facility], [address], [place], [place] [address]    Special Requests   Final    BOTTLES DRAWN AEROBIC ONLY Blood Culture adequate volume Performed at [facility], [address], [place], [place] [address]    Culture   Final    NO GROWTH 5 DAYS Performed at [facility], [address] [place], [place] [address]    Report Status 01/20/2022 FINAL  Final  Body fluid culture w Gram Stain     Status: None   Collection Time: 01/16/22  2:20 AM   Specimen: Gallbladder; Body Fluid  Result Value Ref Range  Status   Specimen Description   Final    GALL BLADDER Performed at [facility], [address] [place], [place] [address]    Special Requests   Final    NONE Performed at [facility],  [address], [place], [place] [address]    Gram Stain   Final    NO SQUAMOUS EPITHELIAL CELLS SEEN NO WBC SEEN FEW GRAM POSITIVE RODS MODERATE GRAM NEGATIVE RODS MODERATE GRAM POSITIVE COCCI    Culture   Final    ABUNDANT ESCHERICHIA COLI ABUNDANT PSEUDOMONAS AERUGINOSA MODERATE PSEUDOMONAS AERUGINOSA TWO DIFFERENT MORPHOLOGIES TWO DIFFERENT SUSCEPTIBILITY PATTERNS Performed at [facility], [address] [place], [place] [address]    Report Status 01/19/2022 FINAL  Final   Organism ID, Bacteria ESCHERICHIA COLI  Final   Organism ID, Bacteria PSEUDOMONAS AERUGINOSA  Final   Organism ID, Bacteria PSEUDOMONAS AERUGINOSA  Final      Susceptibility   Escherichia coli - MIC*    AMPICILLIN >=32 RESISTANT Resistant     CEFAZOLIN 8 SENSITIVE Sensitive     CEFEPIME <=0.12 SENSITIVE Sensitive     CEFTAZIDIME <=1 SENSITIVE Sensitive     CEFTRIAXONE 0.5 SENSITIVE Sensitive     CIPROFLOXACIN <=0.25 SENSITIVE Sensitive     GENTAMICIN <=1 SENSITIVE Sensitive     IMIPENEM <=0.25 SENSITIVE Sensitive     TRIMETH/SULFA <=20 SENSITIVE Sensitive     AMPICILLIN/SULBACTAM 16 INTERMEDIATE Intermediate     PIP/TAZO 8 SENSITIVE Sensitive     * ABUNDANT ESCHERICHIA COLI   Pseudomonas aeruginosa - MIC*    CEFTAZIDIME 4 SENSITIVE Sensitive     CIPROFLOXACIN <=0.25 SENSITIVE Sensitive     GENTAMICIN <=1 SENSITIVE Sensitive     IMIPENEM 1 SENSITIVE Sensitive     PIP/TAZO 8 SENSITIVE Sensitive     CEFEPIME 2 SENSITIVE Sensitive    Pseudomonas aeruginosa - MIC*    CEFTAZIDIME 4 SENSITIVE Sensitive     CIPROFLOXACIN <=0.25 SENSITIVE Sensitive     GENTAMICIN 2 SENSITIVE Sensitive     IMIPENEM 2 SENSITIVE Sensitive     PIP/TAZO <=4 SENSITIVE Sensitive     CEFEPIME 2 SENSITIVE Sensitive     * ABUNDANT PSEUDOMONAS AERUGINOSA    MODERATE PSEUDOMONAS AERUGINOSA      Studies: No results found.  Scheduled Meds:  sodium chloride   Intravenous Once   ALPRAZolam  1 mg Oral q morning   And    ALPRAZolam  1 mg Oral QHS   Chlorhexidine Gluconate Cloth  6 each Topical Daily   fentaNYL  1 patch Transdermal Q72H   furosemide  40 mg Intravenous Daily   insulin aspart  0-15 Units Subcutaneous Q4H   lactulose  30 g Oral TID   levothyroxine  50 mcg Oral QAC breakfast   pantoprazole (PROTONIX) IV  40 mg Intravenous Q12H   sodium chloride flush  10-40 mL Intracatheter Q12H    Continuous Infusions:  sodium chloride Stopped (01/20/22 1344)   ceFEPime (MAXIPIME) IV Stopped (01/20/22 1414)   diltiazem (CARDIZEM) infusion 15 mg/hr (01/20/22 1344)   metronidazole Stopped (01/20/22 1046)   vancomycin Stopped (01/20/22 1245)     LOS: 5 days     Briant Cedar, MD [facility]  If 7PM-7AM, please contact night-coverage [email or web address] 01/20/2022, 4:53 PM

## 2022-01-21 DIAGNOSIS — R6521 Severe sepsis with septic shock: Secondary | ICD-10-CM | POA: Diagnosis not present

## 2022-01-21 DIAGNOSIS — D599 Acquired hemolytic anemia, unspecified: Secondary | ICD-10-CM | POA: Diagnosis not present

## 2022-01-21 DIAGNOSIS — G9340 Encephalopathy, unspecified: Secondary | ICD-10-CM | POA: Diagnosis not present

## 2022-01-21 DIAGNOSIS — A419 Sepsis, unspecified organism: Secondary | ICD-10-CM | POA: Diagnosis not present

## 2022-01-21 LAB — COMPREHENSIVE METABOLIC PANEL
ALT: 20 U/L (ref 0–44)
AST: 19 U/L (ref 15–41)
Albumin: 3.3 g/dL — ABNORMAL LOW (ref 3.5–5.0)
Alkaline Phosphatase: 59 U/L (ref 38–126)
Anion gap: 8 (ref 5–15)
BUN: 22 mg/dL (ref 8–23)
CO2: 22 mmol/L (ref 22–32)
Calcium: 9 mg/dL (ref 8.9–10.3)
Chloride: 108 mmol/L (ref 98–111)
Creatinine, Ser: 0.41 mg/dL — ABNORMAL LOW (ref 0.61–1.24)
GFR, Estimated: >60 mL/min (ref >60)
Glucose, Bld: 163 mg/dL — ABNORMAL HIGH (ref 70–99)
Potassium: 3.2 mmol/L — ABNORMAL LOW (ref 3.5–5.1)
Sodium: 138 mmol/L (ref 135–145)
Total Bilirubin: 2.2 mg/dL — ABNORMAL HIGH (ref 0.3–1.2)
Total Protein: 5.4 g/dL — ABNORMAL LOW (ref 6.5–8.1)

## 2022-01-21 LAB — GLUCOSE, CAPILLARY
Glucose-Capillary: 201 mg/dL — ABNORMAL HIGH (ref 70–99)
Glucose-Capillary: 96 mg/dL (ref 70–99)
Glucose-Capillary: 217 mg/dL — ABNORMAL HIGH (ref 70–99)
Glucose-Capillary: 120 mg/dL — ABNORMAL HIGH (ref 70–99)
Glucose-Capillary: 144 mg/dL — ABNORMAL HIGH (ref 70–99)
Glucose-Capillary: 162 mg/dL — ABNORMAL HIGH (ref 70–99)

## 2022-01-21 MED ORDER — ALPRAZOLAM 0.5 MG PO TABS
1.0000 mg | ORAL_TABLET | Freq: Every morning | ORAL | Status: DC
  Administered 2022-01-21 – 2022-01-24 (×4): 1 mg via ORAL
  Filled 2022-01-21 (×3): qty 1
  Filled 2022-01-21: qty 2

## 2022-01-21 MED ORDER — HYDROMORPHONE HCL 1 MG/ML IJ SOLN
0.5000 mg | Freq: Once | INTRAMUSCULAR | Status: AC
  Administered 2022-01-21: 0.5 mg via INTRAVENOUS
  Filled 2022-01-21: qty 1

## 2022-01-21 MED ORDER — ALPRAZOLAM 0.5 MG PO TABS
2.0000 mg | ORAL_TABLET | Freq: Every day | ORAL | Status: DC
  Administered 2022-01-21 – 2022-01-30 (×10): 2 mg via ORAL
  Filled 2022-01-21: qty 2
  Filled 2022-01-21 (×6): qty 4
  Filled 2022-01-21: qty 2
  Filled 2022-01-21 (×2): qty 4

## 2022-01-21 MED ORDER — POTASSIUM CHLORIDE CRYS ER 20 MEQ PO TBCR
40.0000 meq | EXTENDED_RELEASE_TABLET | Freq: Once | ORAL | Status: AC
Start: 2022-01-21 — End: 2022-01-21
  Administered 2022-01-21: 40 meq via ORAL
  Filled 2022-01-21: qty 2

## 2022-01-22 DIAGNOSIS — G9340 Encephalopathy, unspecified: Secondary | ICD-10-CM | POA: Diagnosis not present

## 2022-01-22 DIAGNOSIS — A419 Sepsis, unspecified organism: Secondary | ICD-10-CM | POA: Diagnosis not present

## 2022-01-22 DIAGNOSIS — D599 Acquired hemolytic anemia, unspecified: Secondary | ICD-10-CM | POA: Diagnosis not present

## 2022-01-22 DIAGNOSIS — R6521 Severe sepsis with septic shock: Secondary | ICD-10-CM | POA: Diagnosis not present

## 2022-01-22 LAB — CBC WITH DIFFERENTIAL/PLATELET
Abs Immature Granulocytes: 0.02 10*3/uL (ref 0.00–0.07)
Abs Immature Granulocytes: 0.03 10*3/uL (ref 0.00–0.07)
Abs Immature Granulocytes: 0.04 10*3/uL (ref 0.00–0.07)
Basophils Absolute: 0 10*3/uL (ref 0.0–0.1)
Basophils Absolute: 0 10*3/uL (ref 0.0–0.1)
Basophils Absolute: 0 10*3/uL (ref 0.0–0.1)
Basophils Relative: 0 %
Basophils Relative: 0 %
Basophils Relative: 1 %
Eosinophils Absolute: 0 10*3/uL (ref 0.0–0.5)
Eosinophils Absolute: 0.1 10*3/uL (ref 0.0–0.5)
Eosinophils Absolute: 0.1 10*3/uL (ref 0.0–0.5)
Eosinophils Relative: 0 %
Eosinophils Relative: 5 %
Eosinophils Relative: 7 %
HCT: 25.4 % — ABNORMAL LOW (ref 39.0–52.0)
HCT: 26.9 % — ABNORMAL LOW (ref 39.0–52.0)
HCT: 25.7 % — ABNORMAL LOW (ref 39.0–52.0)
Hemoglobin: 8.1 g/dL — ABNORMAL LOW (ref 13.0–17.0)
Hemoglobin: 8.8 g/dL — ABNORMAL LOW (ref 13.0–17.0)
Hemoglobin: 8.2 g/dL — ABNORMAL LOW (ref 13.0–17.0)
Immature Granulocytes: 1 %
Immature Granulocytes: 1 %
Immature Granulocytes: 2 %
Lymphocytes Relative: 17 %
Lymphocytes Relative: 31 %
Lymphocytes Relative: 38 %
Lymphs Abs: 0.3 10*3/uL — ABNORMAL LOW (ref 0.7–4.0)
Lymphs Abs: 0.7 10*3/uL (ref 0.7–4.0)
Lymphs Abs: 0.6 10*3/uL — ABNORMAL LOW (ref 0.7–4.0)
MCH: 30 pg (ref 26.0–34.0)
MCH: 30.2 pg (ref 26.0–34.0)
MCH: 30 pg (ref 26.0–34.0)
MCHC: 31.9 g/dL (ref 30.0–36.0)
MCHC: 32.7 g/dL (ref 30.0–36.0)
MCHC: 31.9 g/dL (ref 30.0–36.0)
MCV: 94.1 fL (ref 80.0–100.0)
MCV: 92.4 fL (ref 80.0–100.0)
MCV: 94.1 fL (ref 80.0–100.0)
Monocytes Absolute: 0.1 10*3/uL (ref 0.1–1.0)
Monocytes Absolute: 0.1 10*3/uL (ref 0.1–1.0)
Monocytes Absolute: 0.1 10*3/uL (ref 0.1–1.0)
Monocytes Relative: 6 %
Monocytes Relative: 6 %
Monocytes Relative: 7 %
Neutro Abs: 1.5 10*3/uL — ABNORMAL LOW (ref 1.7–7.7)
Neutro Abs: 1.3 10*3/uL — ABNORMAL LOW (ref 1.7–7.7)
Neutro Abs: 0.7 10*3/uL — ABNORMAL LOW (ref 1.7–7.7)
Neutrophils Relative %: 76 %
Neutrophils Relative %: 57 %
Neutrophils Relative %: 45 %
Platelets: 12 10*3/uL — CL (ref 150–400)
Platelets: 14 10*3/uL — CL (ref 150–400)
Platelets: 11 10*3/uL — CL (ref 150–400)
RBC: 2.7 MIL/uL — ABNORMAL LOW (ref 4.22–5.81)
RBC: 2.91 MIL/uL — ABNORMAL LOW (ref 4.22–5.81)
RBC: 2.73 MIL/uL — ABNORMAL LOW (ref 4.22–5.81)
RDW: 21 % — ABNORMAL HIGH (ref 11.5–15.5)
RDW: 20.4 % — ABNORMAL HIGH (ref 11.5–15.5)
RDW: 20.6 % — ABNORMAL HIGH (ref 11.5–15.5)
WBC: 1.9 10*3/uL — ABNORMAL LOW (ref 4.0–10.5)
WBC: 2.2 10*3/uL — ABNORMAL LOW (ref 4.0–10.5)
WBC: 1.6 10*3/uL — ABNORMAL LOW (ref 4.0–10.5)
nRBC: 1 % — ABNORMAL HIGH (ref 0.0–0.2)
nRBC: 0 % (ref 0.0–0.2)
nRBC: 1.2 % — ABNORMAL HIGH (ref 0.0–0.2)

## 2022-01-22 LAB — BASIC METABOLIC PANEL
Anion gap: 8 (ref 5–15)
BUN: 17 mg/dL (ref 8–23)
CO2: 24 mmol/L (ref 22–32)
Calcium: 8.7 mg/dL — ABNORMAL LOW (ref 8.9–10.3)
Chloride: 106 mmol/L (ref 98–111)
Creatinine, Ser: 0.42 mg/dL — ABNORMAL LOW (ref 0.61–1.24)
GFR, Estimated: >60 mL/min (ref >60)
Glucose, Bld: 149 mg/dL — ABNORMAL HIGH (ref 70–99)
Potassium: 3.5 mmol/L (ref 3.5–5.1)
Sodium: 138 mmol/L (ref 135–145)

## 2022-01-22 LAB — GLUCOSE, CAPILLARY
Glucose-Capillary: 101 mg/dL — ABNORMAL HIGH (ref 70–99)
Glucose-Capillary: 143 mg/dL — ABNORMAL HIGH (ref 70–99)
Glucose-Capillary: 137 mg/dL — ABNORMAL HIGH (ref 70–99)
Glucose-Capillary: 170 mg/dL — ABNORMAL HIGH (ref 70–99)
Glucose-Capillary: 150 mg/dL — ABNORMAL HIGH (ref 70–99)

## 2022-01-22 LAB — CREATININE, SERUM
Creatinine, Ser: 0.35 mg/dL — ABNORMAL LOW (ref 0.61–1.24)
GFR, Estimated: >60 mL/min

## 2022-01-22 LAB — MAGNESIUM: Magnesium: 1.3 mg/dL — ABNORMAL LOW (ref 1.7–2.4)

## 2022-01-22 MED ORDER — SODIUM CHLORIDE 0.9% IV SOLUTION: Freq: Once | INTRAVENOUS | Status: AC

## 2022-01-22 MED ORDER — POTASSIUM CHLORIDE CRYS ER 20 MEQ PO TBCR
40.0000 meq | EXTENDED_RELEASE_TABLET | Freq: Two times a day (BID) | ORAL | Status: AC
  Administered 2022-01-22 (×2): 40 meq via ORAL
  Filled 2022-01-22 (×2): qty 2

## 2022-01-22 NOTE — Progress Notes (Signed)
Pharmacy Antibiotic Note  [name]. is a 71 y.o. male admitted on 01/15/2022 with sepsis.  He was recently hospitalized with septic shock due to cholecystitis requiring percutaneous biliary drain and was discharged on 6/8. Pt also has left toe infection, vascular surgery consulted. Patient is poor surgical candidate at this time.  Pharmacy has been consulted for cefepime and vancomycin dosing.  Today, 01/22/22 Today is day #8 IV antibiotics  SCr remains low/stable Afebrile, WBC low   Plan: Continue cefepime 2 g IV q8h + metronidazole Continue vancomycin 1000 mg IV q12h Continue to monitor renal function for necessary dose adjustments and plan of care for appropriate de-escalation and length of therapy  Height: 6' (182.9 cm) Weight: 96.9 kg (213 lb 10 oz) IBW/kg (Calculated) : 77.6  Temp (24hrs), Avg:97.9 F (36.6 C), Min:97.3 F (36.3 C), Max:98.2 F (36.8 C)  Recent Labs  Lab 01/15/22 1416 01/15/22 1637 01/15/22 1900 01/15/22 2131 01/16/22 0315 01/18/22 1406 01/19/22 0448 01/19/22 0919 01/19/22 1230 01/20/22 0456 01/21/22 0340 01/22/22 0430 01/22/22 0843  WBC 4.2  --   --   --    < > 2.4* 4.1  --   --  3.3* 2.2* 1.6*  --   CREATININE 2.09*  --   --   --    < >  --  0.39*  --   --  0.45* 0.41* 0.35* 0.42*  LATICACIDVEN 2.0* 2.6* 1.9 2.2*  --   --   --   --   --   --   --   --   --   VANCOTROUGH  --   --   --   --   --   --   --  14*  --   --   --   --   --   VANCOPEAK  --   --   --   --   --   --   --   --  36  --   --   --   --    < > = values in this interval not displayed.     Estimated Creatinine Clearance: 100.7 mL/min (A) (by C-G formula based on SCr of 0.42 mg/dL (L)).    Allergies  Allergen Reactions   Clindamycin Dermatitis   Unasyn [Ampicillin-Sulbactam Sodium] Rash    Diffuse whole body rash 12/28/21 Tolerated cefepime 01/15/22   Antimicrobials this admission: Cefepime 6/19 >>  Metronidazole 6/19 >>  Vancomycin 6/19 >>  Microbiology  results: 6/19 BCx: ngtd 6/19 UCx: ngF  6/20 gallbladder fluid: Abundant E.coli (R amp, I Unasyn); Abundant pseudomonas aeruginosa (pan-sensitive)   Pricilla Riffle, PharmD, BCPS Clinical Pharmacist 01/22/2022 1:34 PM

## 2022-01-23 DIAGNOSIS — A419 Sepsis, unspecified organism: Secondary | ICD-10-CM | POA: Diagnosis not present

## 2022-01-23 DIAGNOSIS — R6521 Severe sepsis with septic shock: Secondary | ICD-10-CM | POA: Diagnosis not present

## 2022-01-23 DIAGNOSIS — G9340 Encephalopathy, unspecified: Secondary | ICD-10-CM | POA: Diagnosis not present

## 2022-01-23 DIAGNOSIS — D599 Acquired hemolytic anemia, unspecified: Secondary | ICD-10-CM | POA: Diagnosis not present

## 2022-01-23 LAB — GLUCOSE, CAPILLARY
Glucose-Capillary: 112 mg/dL — ABNORMAL HIGH (ref 70–99)
Glucose-Capillary: 129 mg/dL — ABNORMAL HIGH (ref 70–99)
Glucose-Capillary: 144 mg/dL — ABNORMAL HIGH (ref 70–99)
Glucose-Capillary: 148 mg/dL — ABNORMAL HIGH (ref 70–99)
Glucose-Capillary: 154 mg/dL — ABNORMAL HIGH (ref 70–99)
Glucose-Capillary: 158 mg/dL — ABNORMAL HIGH (ref 70–99)

## 2022-01-23 LAB — PREPARE PLATELET PHERESIS: Unit division: 0

## 2022-01-23 LAB — BASIC METABOLIC PANEL
Anion gap: 6 (ref 5–15)
BUN: 14 mg/dL (ref 8–23)
CO2: 26 mmol/L (ref 22–32)
Calcium: 8.4 mg/dL — ABNORMAL LOW (ref 8.9–10.3)
Chloride: 104 mmol/L (ref 98–111)
Creatinine, Ser: 0.34 mg/dL — ABNORMAL LOW (ref 0.61–1.24)
GFR, Estimated: >60 mL/min (ref >60)
Glucose, Bld: 126 mg/dL — ABNORMAL HIGH (ref 70–99)
Potassium: 3.7 mmol/L (ref 3.5–5.1)
Sodium: 136 mmol/L (ref 135–145)

## 2022-01-23 LAB — CBC WITH DIFFERENTIAL/PLATELET
Abs Immature Granulocytes: 0.03 10*3/uL (ref 0.00–0.07)
Basophils Absolute: 0 10*3/uL (ref 0.0–0.1)
Basophils Relative: 1 %
Eosinophils Absolute: 0.1 10*3/uL (ref 0.0–0.5)
Eosinophils Relative: 4 %
HCT: 25.2 % — ABNORMAL LOW (ref 39.0–52.0)
Hemoglobin: 8.2 g/dL — ABNORMAL LOW (ref 13.0–17.0)
Immature Granulocytes: 1 %
Lymphocytes Relative: 49 %
Lymphs Abs: 1.1 10*3/uL (ref 0.7–4.0)
MCH: 30.4 pg (ref 26.0–34.0)
MCHC: 32.5 g/dL (ref 30.0–36.0)
MCV: 93.3 fL (ref 80.0–100.0)
Monocytes Absolute: 0.1 10*3/uL (ref 0.1–1.0)
Monocytes Relative: 6 %
Neutro Abs: 0.9 10*3/uL — ABNORMAL LOW (ref 1.7–7.7)
Neutrophils Relative %: 39 %
Platelets: 12 10*3/uL — CL (ref 150–400)
RBC: 2.7 MIL/uL — ABNORMAL LOW (ref 4.22–5.81)
RDW: 20.7 % — ABNORMAL HIGH (ref 11.5–15.5)
WBC: 2.3 10*3/uL — ABNORMAL LOW (ref 4.0–10.5)
nRBC: 0 % (ref 0.0–0.2)

## 2022-01-23 LAB — BPAM PLATELET PHERESIS
Blood Product Expiration Date: 202306262359
ISSUE DATE / TIME: 202306260852
Unit Type and Rh: 9500

## 2022-01-23 MED ORDER — SODIUM CHLORIDE 0.9% IV SOLUTION: Freq: Once | INTRAVENOUS | Status: AC

## 2022-01-23 MED ORDER — FUROSEMIDE 40 MG PO TABS
80.0000 mg | ORAL_TABLET | Freq: Every day | ORAL | Status: DC
  Administered 2022-01-24 – 2022-02-01 (×9): 80 mg via ORAL
  Filled 2022-01-23 (×10): qty 2

## 2022-01-23 MED ORDER — DILTIAZEM HCL ER COATED BEADS 180 MG PO CP24
360.0000 mg | ORAL_CAPSULE | Freq: Every day | ORAL | Status: DC
  Administered 2022-01-23 – 2022-02-01 (×10): 360 mg via ORAL
  Filled 2022-01-23 (×10): qty 2

## 2022-01-23 NOTE — TOC Progression Note (Signed)
Transition of Care (TOC) - Progression Note    Patient Details  Name: [name]. MRN: [id number] Date of Birth: [birth year]  Transition of [facility] CM/SW Contact  Darleene Cleaver, [place] Phone Number: 01/23/2022, 2:10 PM  Clinical Narrative:     CSW continuing to follow patient's progress throughout discharge planning.  Expected Discharge Plan: Skilled Nursing Facility Barriers to Discharge: No Barriers Identified  Expected Discharge Plan and Services Expected Discharge Plan: Skilled Nursing Facility       Living arrangements for the past 2 months: Skilled Nursing Facility                                       Social Determinants of Health (SDOH) Interventions    Readmission Risk Interventions     No data to display

## 2022-01-23 NOTE — Progress Notes (Signed)
Palliative:  HPI: 71 y.o. male  with past medical history of hepatitis C, cirrhosis, hepatic encephalopathy, phantom limb syndrome s/p R AKA, chronic oxycodone admitted on 01/15/2022 with confusion, hypotension, abd pain from [facility] related to septic shock due to L toe infection vs pressure ulcer infection??. Recent hospitalization 12/18/21-01/04/22 with septic shock, rhabdomyolysis vs necrotizing fasciitis, PVD/PAD with severe LLE circulation, SVT, hepatic encephalopathy with decompensated liver failure, AKI, splenic infarction, and chronic pain. Palliative followed throughout last hospitalization assisting with goals of care (remains full code/full scope) as well as guidance with pain management. May be facing another amputation vs revascularization procedure but may not be stable for surgery and invasive management. Surgical intervention of any kind would be risky per vascular notes.   I met again today with [name] and his son, [name], at bedside. They have had many conversations about path forward and risks involved. [name] maintains his desire for intervention understanding there are risks involved but leaning on his faith that he can make it through and have more time. Plans for transition to [facility] for close follow up with vascular and to pursue procedures if able to offer. Platelets continue to remain too low for intervention at this time. He continues to request full code/full scope care. He does not like the repeat conversations regarding his poor prognosis.   All questions/concerns addressed. Emotional support provided.   Exam: Alert, oriented. Ill-appearing. HR 120s. Breathing regular, unlabored. Abd soft. R AKA. L wound not assessed but with clean dressing.    Plan: - Continue full code/full scope at patient request.  - I will continue to check in for support but goals of care remain consistently clear at this time.   25 min  Vinie Sill, NP Palliative Medicine Team Pager  [phone] (Please see [email or web address] for schedule) Team Phone [phone]    Greater than 50%  of this time was spent counseling and coordinating care related to the above assessment and plan

## 2022-01-24 DIAGNOSIS — G9340 Encephalopathy, unspecified: Secondary | ICD-10-CM | POA: Diagnosis not present

## 2022-01-24 DIAGNOSIS — I70245 Atherosclerosis of native arteries of left leg with ulceration of other part of foot: Secondary | ICD-10-CM

## 2022-01-24 LAB — CBC WITH DIFFERENTIAL/PLATELET
Abs Immature Granulocytes: 0.02 10*3/uL (ref 0.00–0.07)
Basophils Absolute: 0 10*3/uL (ref 0.0–0.1)
Basophils Relative: 1 %
Eosinophils Absolute: 0.1 10*3/uL (ref 0.0–0.5)
Eosinophils Relative: 3 %
HCT: 22.1 % — ABNORMAL LOW (ref 39.0–52.0)
Hemoglobin: 7.4 g/dL — ABNORMAL LOW (ref 13.0–17.0)
Immature Granulocytes: 1 %
Lymphocytes Relative: 53 %
Lymphs Abs: 1.2 10*3/uL (ref 0.7–4.0)
MCH: 30.8 pg (ref 26.0–34.0)
MCHC: 33.5 g/dL (ref 30.0–36.0)
MCV: 92.1 fL (ref 80.0–100.0)
Monocytes Absolute: 0.1 10*3/uL (ref 0.1–1.0)
Monocytes Relative: 6 %
Neutro Abs: 0.8 10*3/uL — ABNORMAL LOW (ref 1.7–7.7)
Neutrophils Relative %: 36 %
Platelets: 10 10*3/uL — CL (ref 150–400)
RBC: 2.4 MIL/uL — ABNORMAL LOW (ref 4.22–5.81)
RDW: 20.6 % — ABNORMAL HIGH (ref 11.5–15.5)
WBC: 2.2 10*3/uL — ABNORMAL LOW (ref 4.0–10.5)
nRBC: 0 % (ref 0.0–0.2)

## 2022-01-24 LAB — GLUCOSE, CAPILLARY
Glucose-Capillary: 120 mg/dL — ABNORMAL HIGH (ref 70–99)
Glucose-Capillary: 126 mg/dL — ABNORMAL HIGH (ref 70–99)
Glucose-Capillary: 130 mg/dL — ABNORMAL HIGH (ref 70–99)
Glucose-Capillary: 130 mg/dL — ABNORMAL HIGH (ref 70–99)
Glucose-Capillary: 147 mg/dL — ABNORMAL HIGH (ref 70–99)

## 2022-01-24 MED ORDER — SODIUM CHLORIDE 0.9% IV SOLUTION: Freq: Once | INTRAVENOUS | Status: AC

## 2022-01-24 NOTE — Progress Notes (Signed)
Pt is transferred from [facility] to [facility] by 2 [facility] staff. He is alert and fully oriented x 4, no acute distress at arrival. He is hemodynamically stable, afebrile, normal respiratory effort. NSR on the monitor. Plan of care is reviewed.   Skin and chronic wounds assessment are done. Left foot is warm to touch, positive for pitting edema 2+, minimal serosanguinous drainage with foul smell.  Dressing is changed on left foot and left leg. Pervalon boot is applied on left foot. 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 Pt is minimally able to turn himself, therefore we will order an air mattress for him. His prior sacral pressure ulcer is cleaned. Foam is applied.   We are able to get weak signals from left PT and DP via doppler.  CCMD is notified with 2nd person verification per protocol. CHG bath is given. Room is oriented, call bell is within reaching. We will continue to monitor.   Kennyth Lose, RN

## 2022-01-24 NOTE — Progress Notes (Signed)
Lab reported critical result; platelets 10.  MD notified.

## 2022-01-24 NOTE — Progress Notes (Addendum)
  Progress Note    01/24/2022 7:32 AM * No surgery found *  Subjective:  opened eyes initially but very somnolent   Vitals:   01/23/22 2333 01/24/22 0415  BP: (!) 135/59 128/63  Pulse: 96 89  Resp: 18 18  Temp: 98.1 F (36.7 C) 98.5 F (36.9 C)  SpO2: 95% 95%   Physical Exam: Cardiac:  regular Lungs:  non labored Extremities:  left leg dressed and in Prevalon boot Abdomen:  soft, non distended Neurologic: somnolent  CBC    Component Value Date/Time   WBC 2.3 (L) 01/23/2022 0536   RBC 2.70 (L) 01/23/2022 0536   HGB 8.2 (L) 01/23/2022 0536   HGB 12.5 (L) 12/23/2013 1652   HGB 8.7 (L) 02/06/2011 1015   HCT 25.2 (L) 01/23/2022 0536   HCT 38.0 (L) 12/23/2013 1652   HCT 26.2 (L) 02/06/2011 1015   PLT 12 (LL) 01/23/2022 0536   PLT 50 (L) 12/23/2013 1652   PLT 24 (L) 02/06/2011 1015   MCV 93.3 01/23/2022 0536   MCV 103 (H) 12/23/2013 1652   MCV 104.0 (H) 02/06/2011 1015   MCH 30.4 01/23/2022 0536   MCHC 32.5 01/23/2022 0536   RDW 20.7 (H) 01/23/2022 0536   RDW 18.7 (H) 12/23/2013 1652   RDW 18.5 (H) 02/06/2011 1015   LYMPHSABS 1.1 01/23/2022 0536   LYMPHSABS 0.7 (L) 12/23/2013 1652   LYMPHSABS 0.7 (L) 02/06/2011 1015   MONOABS 0.1 01/23/2022 0536   MONOABS 0.3 12/23/2013 1652   MONOABS 0.2 02/06/2011 1015   EOSABS 0.1 01/23/2022 0536   EOSABS 0.1 12/23/2013 1652   BASOSABS 0.0 01/23/2022 0536   BASOSABS 0.0 12/23/2013 1652   BASOSABS 0.0 02/06/2011 1015    BMET    Component Value Date/Time   NA 136 01/23/2022 0536   K 3.7 01/23/2022 0536   CL 104 01/23/2022 0536   CO2 26 01/23/2022 0536   GLUCOSE 126 (H) 01/23/2022 0536   BUN 14 01/23/2022 0536   CREATININE 0.34 (L) 01/23/2022 0536   CALCIUM 8.4 (L) 01/23/2022 0536   CALCIUM 9.8 02/26/2012 1428   GFRNONAA >60 01/23/2022 0536   GFRAA >60 04/06/2020 1507    INR    Component Value Date/Time   INR 1.4 (H) 01/16/2022 0315     Intake/Output Summary (Last 24 hours) at 01/24/2022 0732 Last data  filed at 01/24/2022 5625 Gross per 24 hour  Intake 1379.4 ml  Output 2450 ml  Net -1070.6 ml     Assessment/Plan:  71 y.o. male with left leg and foot ulceration with arterial insufficiency. Patient has multiple medical co morbidities. Dr. Trula Slade has discussed at length with patient/ son about recommendation for amputation vs revascularization. He is at very high risk due to thrombocytopenia and multiple medical issues. Platelets are 12. Appreciate Hematology recommendations. Will need medically optimized prior to any surgery. Dr. Trula Slade will provide details regarding operative management   [facility] [phone] 01/24/2022 7:32 AM   I agree with the above.  I have seen and evaluated the patient.  I spoke with the patient and his son at the bedside today.  I discussed that the best option for him is going to be a above-knee amputation.  I discussed the potential risk with this including death, wound healing issues, and bleeding.  I would consider his amputation, once we can get his platelet count above 50,000.  Annamarie Major

## 2022-01-24 NOTE — Progress Notes (Signed)
PROGRESS NOTE  Derek Anon Menor Jr.  NAT:[id number] DOB: January 16, 1951 DOA: 01/15/2022 PCP: [name], FNP   Brief Narrative:  Patient is a 71 year old male with history of hepatitis C, liver cirrhosis, chronic oxycodone use who presented with confusion, low blood pressure, abdominal pain from [facility] health care.  He was recently discharged on 01/04/2022 after being admitted for septic shock due to acute cholecystitis requiring percutaneous biliary drain.  Patient was also found with confusion presentation.  On presentation he was hypotensive and required ICU admission, transferred to [facility] service on 6/21.  He was found to have left toe infection, multiple pressure ulcers concerning for sepsis.  Vascular surgery consulted.  Hospital course also remarkable for severe thrombocytopenia needing platelet transfusion.  Vascular surgery, hematology following.  Vascular surgery planning for amputation versus revascularization of the left lower extremity after improvement in the platelet level.   Assessment & Plan:  Principal Problem:   Acute encephalopathy Active Problems:   Septic shock (HCC)  Septic shock secondary to left toe infection/multiple pressure ulcers: Hypotensive on presentation, suspected to have sepsis.  Started on broad-spectrum antibiotics.  Blood cultures have been negative so far.  MRI of the left lower extremity did not show any osteomyelitis.  Continue broad-spectrum antibiotics for now.  Left lower limb ischemia/peripheral artery disease: Vascular surgery planning for revascularization versus amputation pending improvement in the platelet level.  Pancytopenia/thrombocytopenia: Hematology following.  Suspected to be from cirrhosis.  He has chronic  thrombo-cytopenia.  Platelet level very low in the range of 10 K.  Low WBC.  Total of 5 units of platelets transfusion.  Check CBC tomorrow.  Low suspicion for TTP, DIC, HIT, ITP.  Monitor hemoglobin, no evidence of acute blood  loss.  Acute metabolic encephalopathy: Likely secondary to sepsis/AKI/hyperammonemia.  He has history of hepatic encephalopathy.  Continue lactulose.  Currently alert, oriented.  New onset A-fib: Now in normal sinus rhythm, spontaneously converted after started on Cardizem drip.  Cardiology was following, currently on Cardizem 360 mg daily.  Not a candidate for anticoagulant due to thrombocytopenia.  Monitor on telemetry.  History of hepatitis C/cirrhosis: On Lasix 80 mg daily.  Has history of hepatic encephalopathy. Has severe scrotal edema.  Continue lactulose.  Hypothyroidism: On Synthyroid  AKI: Resolved.   Chronic pain syndrome: On Opiods at baseline  Hypokalemia/hypomagnesemia: Supplemented and monitor as needed  Goals of care: Poor prognosis due to multiple comorbidities.  Palliative care was consulted.  Remains full code         Pressure Injury 01/03/22 Toe (Comment  which one) Anterior;Left Unstageable - Full thickness tissue loss in which the base of the injury is covered by slough (yellow, tan, gray, green or brown) and/or eschar (tan, brown or black) in the wound bed. (Active)  01/03/22 1000  Location: Toe (Comment  which one)  Location Orientation: Anterior;Left  Staging: Unstageable - Full thickness tissue loss in which the base of the injury is covered by slough (yellow, tan, gray, green or brown) and/or eschar (tan, brown or black) in the wound bed.  Wound Description (Comments):   Present on Admission:   Dressing Type Gauze (Comment) 01/24/22 0900     Pressure Injury 01/03/22 Sacrum Mid Unstageable - Full thickness tissue loss in which the base of the injury is covered by slough (yellow, tan, gray, green or brown) and/or eschar (tan, brown or black) in the wound bed. (Active)  01/03/22 1000  Location: Sacrum  Location Orientation: Mid  Staging: Unstageable - Full thickness tissue  loss in which the base of the injury is covered by slough (yellow, tan, gray, green  or brown) and/or eschar (tan, brown or black) in the wound bed.  Wound Description (Comments):   Present on Admission:   Dressing Type Foam - Lift dressing to assess site every shift 01/24/22 0900     Pressure Injury 01/03/22 Coccyx Mid Unstageable - Full thickness tissue loss in which the base of the injury is covered by slough (yellow, tan, gray, green or brown) and/or eschar (tan, brown or black) in the wound bed. (Active)  01/03/22 1000  Location: Coccyx  Location Orientation: Mid  Staging: Unstageable - Full thickness tissue loss in which the base of the injury is covered by slough (yellow, tan, gray, green or brown) and/or eschar (tan, brown or black) in the wound bed.  Wound Description (Comments):   Present on Admission:   Dressing Type Foam - Lift dressing to assess site every shift 01/24/22 0900     Pressure Injury 01/03/22 Ankle Anterior;Left Deep Tissue Pressure Injury - Purple or maroon localized area of discolored intact skin or blood-filled blister due to damage of underlying soft tissue from pressure and/or shear. (Active)  01/03/22 1000  Location: Ankle  Location Orientation: Anterior;Left  Staging: Deep Tissue Pressure Injury - Purple or maroon localized area of discolored intact skin or blood-filled blister due to damage of underlying soft tissue from pressure and/or shear.  Wound Description (Comments):   Present on Admission:   Dressing Type Foam - Lift dressing to assess site every shift 01/24/22 0900     Pressure Injury 01/15/22 Pretibial Distal;Left Unstageable - Full thickness tissue loss in which the base of the injury is covered by slough (yellow, tan, gray, green or brown) and/or eschar (tan, brown or black) in the wound bed. unstageable wound with  (Active)  01/15/22 2010  Location: Pretibial  Location Orientation: Distal;Left  Staging: Unstageable - Full thickness tissue loss in which the base of the injury is covered by slough (yellow, tan, gray, green or  brown) and/or eschar (tan, brown or black) in the wound bed.  Wound Description (Comments): unstageable wound with drainage  Present on Admission: Yes  Dressing Type Foam - Lift dressing to assess site every shift 01/24/22 0900    DVT prophylaxis:SCDs Start: 01/15/22 1859     Code Status: Full Code  Family Communication: Discussed with son at bedside on 6/28  Patient status:Inpatient  Patient is from :SNF  Anticipated discharge to:SNF  Estimated DC date:Not sure   Consultants: Vascular surgery, oncology  Procedures: None yet  Antimicrobials:  Anti-infectives (From admission, onward)    Start     Dose/Rate Route Frequency Ordered Stop   01/16/22 1400  vancomycin (VANCOCIN) IVPB 1000 mg/200 mL premix  Status:  Discontinued        1,000 mg 200 mL/hr over 60 Minutes Intravenous Every 24 hours 01/15/22 1923 01/16/22 0747   01/16/22 1100  metroNIDAZOLE (FLAGYL) IVPB 500 mg        500 mg 100 mL/hr over 60 Minutes Intravenous Every 12 hours 01/16/22 1004     01/16/22 1000  vancomycin (VANCOCIN) IVPB 1000 mg/200 mL premix        1,000 mg 200 mL/hr over 60 Minutes Intravenous Every 12 hours 01/16/22 0747     01/16/22 0845  ceFEPIme (MAXIPIME) 2 g in sodium chloride 0.9 % 100 mL IVPB        2 g 200 mL/hr over 30 Minutes Intravenous Every 8 hours 01/16/22  0254     01/15/22 2200  ceFEPIme (MAXIPIME) 2 g in sodium chloride 0.9 % 100 mL IVPB  Status:  Discontinued        2 g 200 mL/hr over 30 Minutes Intravenous Every 12 hours 01/15/22 1923 01/16/22 0757   01/15/22 1430  vancomycin (VANCOREADY) IVPB 2000 mg/400 mL        2,000 mg 200 mL/hr over 120 Minutes Intravenous  Once 01/15/22 1425 01/15/22 1746   01/15/22 1415  ceFEPIme (MAXIPIME) 2 g in sodium chloride 0.9 % 100 mL IVPB        2 g 200 mL/hr over 30 Minutes Intravenous  Once 01/15/22 1412 01/15/22 1528   01/15/22 1415  metroNIDAZOLE (FLAGYL) IVPB 500 mg        500 mg 100 mL/hr over 60 Minutes Intravenous  Once 01/15/22  1412 01/15/22 1529   01/15/22 1415  vancomycin (VANCOCIN) IVPB 1000 mg/200 mL premix  Status:  Discontinued        1,000 mg 200 mL/hr over 60 Minutes Intravenous  Once 01/15/22 1412 01/15/22 1420       Subjective: Patient seen and examined at the bedside this morning.  Hemodynamically stable.  Comfortable, lying in bed.  Very deconditioned.  Not in any distress, alert and oriented.  Objective: Vitals:   01/24/22 0415 01/24/22 0826 01/24/22 1100 01/24/22 1125  BP: 128/63 119/69 127/62 131/63  Pulse: 89 87 89 87  Resp: '18 15 17 17  '$ Temp: 98.5 F (36.9 C) 98.3 F (36.8 C) 98.5 F (36.9 C) 98.6 F (37 C)  TempSrc: Oral Oral Oral Oral  SpO2: 95% 95% 93% 93%  Weight: 97.6 kg     Height:        Intake/Output Summary (Last 24 hours) at 01/24/2022 1253 Last data filed at 01/24/2022 1151 Gross per 24 hour  Intake 1014.85 ml  Output 2300 ml  Net -1285.15 ml   Filed Weights   01/23/22 0500 01/23/22 2128 01/24/22 0415  Weight: 97 kg 96.8 kg 97.6 kg    Examination:  General exam: Overall comfortable, not in distress, deconditioned, obese HEENT: PERRL Respiratory system:  no wheezes or crackles  Cardiovascular system: S1 & S2 heard, RRR.  Gastrointestinal system: Abdomen is nondistended, soft and nontender. Central nervous system: Alert and oriented Extremities: No edema, no clubbing ,no cyanosis, right AKA, ulcer on the back of the left leg and left toe Skin: No rashes, no icterus     Data Reviewed: I have personally reviewed following labs and imaging studies  CBC: Recent Labs  Lab 01/20/22 0456 01/21/22 0340 01/22/22 0430 01/23/22 0536 01/24/22 0800  WBC 3.3* 2.2* 1.6* 2.3* 2.2*  NEUTROABS 2.2 1.3* 0.7* 0.9* 0.8*  HGB 9.5* 8.8* 8.2* 8.2* 7.4*  HCT 29.5* 26.9* 25.7* 25.2* 22.1*  MCV 92.8 92.4 94.1 93.3 92.1  PLT 18* 14* 11* 12* 10*   Basic Metabolic Panel: Recent Labs  Lab 01/18/22 0355 01/19/22 0448 01/20/22 0456 01/21/22 0340 01/22/22 0430 01/22/22 0843  01/23/22 0536  NA 143 142 138 138  --  138 136  K 3.4* 3.3* 3.3* 3.2*  --  3.5 3.7  CL 115* 110 107 108  --  106 104  CO2 20* '23 22 22  '$ --  24 26  GLUCOSE 167* 176* 164* 163*  --  149* 126*  BUN 35* 28* 24* 22  --  17 14  CREATININE 0.39* 0.39* 0.45* 0.41* 0.35* 0.42* 0.34*  CALCIUM 8.3* 9.5 9.5 9.0  --  8.7*  8.4*  MG 1.5* 1.7 1.8  --   --  1.3*  --      Recent Results (from the past 240 hour(s))  Blood Culture (routine x 2)     Status: None   Collection Time: 01/15/22  2:11 PM   Specimen: BLOOD  Result Value Ref Range Status   Specimen Description   Final    BLOOD BLOOD LEFT HAND Performed at [facility] [address], [place], [place] [address]    Special Requests   Final    BOTTLES DRAWN AEROBIC AND ANAEROBIC Blood Culture results may not be optimal due to an inadequate volume of blood received in culture bottles Performed at [facility] [address] [place], [place] [address]    Culture   Final    NO GROWTH 5 DAYS Performed at [facility], [place] [address], [place], [place] [address]    Report Status 01/20/2022 FINAL  Final  Urine Culture     Status: None   Collection Time: 01/15/22  2:12 PM   Specimen: In/Out Cath Urine  Result Value Ref Range Status   Specimen Description   Final    IN/OUT CATH URINE Performed at [facility] [address], [place], [place] [address]    Special Requests   Final    NONE Performed at [facility], [place] [address] [place], [place] [address]    Culture   Final    NO GROWTH Performed at [facility], [place] [address], [place], [place] [address]    Report Status 01/16/2022 FINAL  Final  Resp Panel by RT-PCR (Flu A&B, Covid) Anterior Nasal Swab     Status: None   Collection Time: 01/15/22  3:22 PM   Specimen: Anterior Nasal Swab  Result Value Ref Range Status   SARS Coronavirus 2 by RT PCR NEGATIVE NEGATIVE Final    Comment:  (NOTE) SARS-CoV-2 target nucleic acids are NOT DETECTED.  The SARS-CoV-2 RNA is generally detectable in upper respiratory specimens during the acute phase of infection. The lowest concentration of SARS-CoV-2 viral copies this assay can detect is 138 copies/mL. A negative result does not preclude SARS-Cov-2 infection and should not be used as the sole basis for treatment or other patient management decisions. A negative result may occur with  improper specimen collection/handling, submission of specimen other than nasopharyngeal swab, presence of viral mutation(s) within the areas targeted by this assay, and inadequate number of viral copies(<138 copies/mL). A negative result must be combined with clinical observations, patient history, and epidemiological information. The expected result is Negative.  Fact Sheet for Patients:  EntrepreneurPulse.com.au  Fact Sheet for Healthcare Providers:  IncredibleEmployment.be  This test is no t yet approved or cleared by the [place] FDA and  has been authorized for detection and/or diagnosis of SARS-CoV-2 by FDA under an Emergency Use Authorization (EUA). This EUA will remain  in effect (meaning this test can be used) for the duration of the COVID-19 declaration under Section 564(b)(1) of the Act, 21 U.S.C.section 360bbb-3(b)(1), unless the authorization is terminated  or revoked sooner.       Influenza A by PCR NEGATIVE NEGATIVE Final   Influenza B by PCR NEGATIVE NEGATIVE Final    Comment: (NOTE) The Xpert Xpress SARS-CoV-2/FLU/RSV plus assay is intended as an aid in the diagnosis of influenza from Nasopharyngeal swab specimens and should not be used as a sole basis for treatment. Nasal washings and aspirates  are unacceptable for Xpert Xpress SARS-CoV-2/FLU/RSV testing.  Fact Sheet for Patients: EntrepreneurPulse.com.au  Fact Sheet for Healthcare  Providers: IncredibleEmployment.be  This test is not yet approved or cleared by the [place] FDA and has been authorized for detection and/or diagnosis of SARS-CoV-2 by FDA under an Emergency Use Authorization (EUA). This EUA will remain in effect (meaning this test can be used) for the duration of the COVID-19 declaration under Section 564(b)(1) of the Act, 21 U.S.C. section 360bbb-3(b)(1), unless the authorization is terminated or revoked.  Performed at [facility], [place] [address], [place], [place] [address]   MRSA Next Gen by PCR, Nasal     Status: None   Collection Time: 01/15/22  8:21 PM   Specimen: Nasal Mucosa; Nasal Swab  Result Value Ref Range Status   MRSA by PCR Next Gen NOT DETECTED NOT DETECTED Final    Comment: (NOTE) The GeneXpert MRSA Assay (FDA approved for NASAL specimens only), is one component of a comprehensive MRSA colonization surveillance program. It is not intended to diagnose MRSA infection nor to guide or monitor treatment for MRSA infections. Test performance is not FDA approved in patients less than 46 years old. Performed at [facility], [place] [address], [place], [place] [address]   Blood Culture (routine x 2)     Status: None   Collection Time: 01/15/22  9:31 PM   Specimen: BLOOD RIGHT HAND  Result Value Ref Range Status   Specimen Description   Final    BLOOD RIGHT HAND Performed at [facility] [address] [place], [place] [address]    Special Requests   Final    BOTTLES DRAWN AEROBIC ONLY Blood Culture adequate volume Performed at [facility] [address], [place], [place] [address]    Culture   Final    NO GROWTH 5 DAYS Performed at [facility], [place] [address], [place], [place] [address]    Report Status 01/20/2022 FINAL  Final  Body fluid culture w Gram Stain     Status: None   Collection Time: 01/16/22  2:20 AM    Specimen: Gallbladder; Body Fluid  Result Value Ref Range Status   Specimen Description   Final    GALL BLADDER Performed at [facility] [address], [place], [place] [address]    Special Requests   Final    NONE Performed at [facility], [place] [address] [place], [place] [address]    Gram Stain   Final    NO SQUAMOUS EPITHELIAL CELLS SEEN NO WBC SEEN FEW GRAM POSITIVE RODS MODERATE GRAM NEGATIVE RODS MODERATE GRAM POSITIVE COCCI    Culture   Final    ABUNDANT ESCHERICHIA COLI ABUNDANT PSEUDOMONAS AERUGINOSA MODERATE PSEUDOMONAS AERUGINOSA TWO DIFFERENT MORPHOLOGIES TWO DIFFERENT SUSCEPTIBILITY PATTERNS Performed at [facility], [place] [address], [place], [place] [address]    Report Status 01/19/2022 FINAL  Final   Organism ID, Bacteria ESCHERICHIA COLI  Final   Organism ID, Bacteria PSEUDOMONAS AERUGINOSA  Final   Organism ID, Bacteria PSEUDOMONAS AERUGINOSA  Final      Susceptibility   Escherichia coli - MIC*    AMPICILLIN >=32 RESISTANT Resistant     CEFAZOLIN 8 SENSITIVE Sensitive     CEFEPIME <=0.12 SENSITIVE Sensitive     CEFTAZIDIME <=1 SENSITIVE Sensitive     CEFTRIAXONE 0.5 SENSITIVE Sensitive     CIPROFLOXACIN <=0.25 SENSITIVE Sensitive     GENTAMICIN <=1  SENSITIVE Sensitive     IMIPENEM <=0.25 SENSITIVE Sensitive     TRIMETH/SULFA <=20 SENSITIVE Sensitive     AMPICILLIN/SULBACTAM 16 INTERMEDIATE Intermediate     PIP/TAZO 8 SENSITIVE Sensitive     * ABUNDANT ESCHERICHIA COLI   Pseudomonas aeruginosa - MIC*    CEFTAZIDIME 4 SENSITIVE Sensitive     CIPROFLOXACIN <=0.25 SENSITIVE Sensitive     GENTAMICIN <=1 SENSITIVE Sensitive     IMIPENEM 1 SENSITIVE Sensitive     PIP/TAZO 8 SENSITIVE Sensitive     CEFEPIME 2 SENSITIVE Sensitive    Pseudomonas aeruginosa - MIC*    CEFTAZIDIME 4 SENSITIVE Sensitive     CIPROFLOXACIN <=0.25 SENSITIVE Sensitive     GENTAMICIN 2 SENSITIVE Sensitive     IMIPENEM 2 SENSITIVE  Sensitive     PIP/TAZO <=4 SENSITIVE Sensitive     CEFEPIME 2 SENSITIVE Sensitive     * ABUNDANT PSEUDOMONAS AERUGINOSA    MODERATE PSEUDOMONAS AERUGINOSA     Radiology Studies: No results found.  Scheduled Meds:  ALPRAZolam  1 mg Oral q morning   And   ALPRAZolam  2 mg Oral QHS   Chlorhexidine Gluconate Cloth  6 each Topical Daily   diltiazem  360 mg Oral Daily   fentaNYL  1 patch Transdermal Q72H   furosemide  80 mg Oral Daily   insulin aspart  0-15 Units Subcutaneous Q4H   lactulose  30 g Oral TID   levothyroxine  50 mcg Oral QAC breakfast   pantoprazole (PROTONIX) IV  40 mg Intravenous Q12H   sodium chloride flush  10-40 mL Intracatheter Q12H   Continuous Infusions:  sodium chloride 10 mL/hr at 01/22/22 0600   ceFEPime (MAXIPIME) IV 2 g (01/24/22 0502)   metronidazole 500 mg (01/24/22 1023)   vancomycin 1,000 mg (01/24/22 0830)     LOS: 9 days   Shelly Coss, MD [facility] P6/28/2023, 12:53 PM

## 2022-01-24 NOTE — Care Management Important Message (Signed)
Important Message  Patient Details  Name: [name]. MRN: [id number] Date of Birth: 1950/11/20   Medicare Important Message Given:  Yes     Shelda Altes 01/24/2022, 8:06 AM

## 2022-01-25 DIAGNOSIS — G9340 Encephalopathy, unspecified: Secondary | ICD-10-CM | POA: Diagnosis not present

## 2022-01-25 LAB — VANCOMYCIN, PEAK: Vancomycin Pk: 24 ug/mL — ABNORMAL LOW (ref 30–40)

## 2022-01-25 LAB — PREPARE PLATELET PHERESIS
Unit division: 0
Unit division: 0
Unit division: 0

## 2022-01-25 LAB — GLUCOSE, CAPILLARY
Glucose-Capillary: 120 mg/dL — ABNORMAL HIGH (ref 70–99)
Glucose-Capillary: 171 mg/dL — ABNORMAL HIGH (ref 70–99)
Glucose-Capillary: 334 mg/dL — ABNORMAL HIGH (ref 70–99)
Glucose-Capillary: 178 mg/dL — ABNORMAL HIGH (ref 70–99)
Glucose-Capillary: 177 mg/dL — ABNORMAL HIGH (ref 70–99)

## 2022-01-25 LAB — AMMONIA: Ammonia: 31 umol/L (ref 9–35)

## 2022-01-25 LAB — BPAM PLATELET PHERESIS
Blood Product Expiration Date: 202306302359
Blood Product Expiration Date: 202306302359
Blood Product Expiration Date: 202307012359
ISSUE DATE / TIME: 202306281054
ISSUE DATE / TIME: 202306281320
Unit Type and Rh: 7300
Unit Type and Rh: 7300
Unit Type and Rh: 8400

## 2022-01-25 LAB — CBC
HCT: 22 % — ABNORMAL LOW (ref 39.0–52.0)
Hemoglobin: 7.1 g/dL — ABNORMAL LOW (ref 13.0–17.0)
MCH: 30.3 pg (ref 26.0–34.0)
MCHC: 32.3 g/dL (ref 30.0–36.0)
MCV: 94 fL (ref 80.0–100.0)
Platelets: 17 10*3/uL — CL (ref 150–400)
RBC: 2.34 MIL/uL — ABNORMAL LOW (ref 4.22–5.81)
RDW: 21.2 % — ABNORMAL HIGH (ref 11.5–15.5)
WBC: 2.1 10*3/uL — ABNORMAL LOW (ref 4.0–10.5)
nRBC: 0 % (ref 0.0–0.2)

## 2022-01-25 LAB — VANCOMYCIN, TROUGH: Vancomycin Tr: 12 ug/mL — ABNORMAL LOW (ref 15–20)

## 2022-01-25 LAB — PREPARE RBC (CROSSMATCH)

## 2022-01-25 MED ORDER — LACTULOSE 10 GM/15ML PO SOLN
10.0000 g | Freq: Three times a day (TID) | ORAL | Status: DC
  Administered 2022-01-25 – 2022-01-30 (×13): 10 g via ORAL
  Filled 2022-01-25 (×15): qty 15

## 2022-01-25 MED ORDER — ALPRAZOLAM 0.5 MG PO TABS
0.5000 mg | ORAL_TABLET | Freq: Two times a day (BID) | ORAL | Status: DC | PRN
Start: 2022-01-25 — End: 2022-01-30
  Administered 2022-01-25 – 2022-01-30 (×10): 0.5 mg via ORAL
  Filled 2022-01-25 (×10): qty 1

## 2022-01-25 MED ORDER — SODIUM CHLORIDE 0.9% IV SOLUTION: Freq: Once | INTRAVENOUS | Status: AC

## 2022-01-25 NOTE — Progress Notes (Signed)
Physical Therapy Treatment Patient Details Name: [name]. MRN: [id number] DOB: Jan 13, 1951 Today's Date: 01/25/2022   History of Present Illness 71 year-old M with PMH of EtOH cirrhosis, nonimmune hemolytic anemia, pancytopenia, right AKA and hepatitis C brought to ED 12/18/21 with altered mental status after he slid off his bed and found down on the ground.. Patient  admitted to ICU with hypovolemic shock, hemolytic anemia, liver failure, encephalopathy, AKI and hyperkalemia.    PT Comments    Pt received supine and agreeable to bed level session with encouragement. Pt requiring max assist with bed placed in trendelenburg position to scoot toward [facility] as pt near foot. Pt able to reach and grab rails but unable to generate power through UE to assist to pull up. Pt needing total assist +2 to roll toward right for pillow placement to unweight painful L sacrum. Pt tangential throughout session often stating he was able to complete task but then when prompted pt unable and becoming frustrated. Pt needing max encouragement to initiate all active movements throughout session. Pt continues to benefit from skilled PT services to progress toward functional mobility goals.    Recommendations for follow up therapy are one component of a multi-disciplinary discharge planning process, led by the attending physician.  Recommendations may be updated based on patient status, additional functional criteria and insurance authorization.  Follow Up Recommendations  Skilled nursing-short term rehab (<3 hours/day) Can patient physically be transported by private vehicle: No   Assistance Recommended at Discharge Frequent or constant Supervision/Assistance  Patient can return home with the following Two people to help with walking and/or transfers;A lot of help with bathing/dressing/bathroom;Assistance with cooking/housework;Direct supervision/assist for medications management;Direct supervision/assist for  financial management;Assist for transportation;Help with stairs or ramp for entrance   Equipment Recommendations  None recommended by PT    Recommendations for Other Services       Precautions / Restrictions Precautions Precautions: Fall Precaution Comments: R AKA, profound  weakness  left  hip, knee, demonstrates no ankle dorsi/plantar flex,  lacks P/AROM Knee extension appearing contracted;  per patient, contracture PTA(WC dep)also Restrictions Weight Bearing Restrictions: No     Mobility  Bed Mobility Overal bed mobility: Needs Assistance Bed Mobility: Rolling Rolling: Max assist, +2 for physical assistance         General bed mobility comments: Pt relied on Bil UE pull from bed handrail and +2  assist at hip. able to bring BUE over head and reach rail but unable to generate force to pull self up    Transfers                   General transfer comment: Pt unable to stand    Ambulation/Gait               General Gait Details: Pt unable to ambulate prior to hospital admission. stating he was using motorized WC   Stairs             Wheelchair Mobility    Modified Rankin (Stroke Patients Only)       Balance     Sitting balance-Leahy Scale: Zero                                      Cognition Arousal/Alertness: Awake/alert Behavior During Therapy: WFL for tasks assessed/performed Overall Cognitive Status: Within Functional Limits for tasks assessed  General Comments: Pt tangental in conversation, asking to be taken into BR, stating he could complete tasks then when prompted to do so unable        Exercises      General Comments General comments (skin integrity, edema, etc.): Pt stating that his bottom was sore and stating he was able to roll, without assist for pillow placement, pt unable to initiate any momentum to roll and needed total assist of 2 to complete with pt  becoming aggitated about being "man handled' educated pt on need for his assistance to complete rolling for peri-care and pillow placement for his benefit      Pertinent Vitals/Pain Pain Assessment Pain Assessment: Faces Faces Pain Scale: Hurts little more Pain Location: "everywhere" Pain Descriptors / Indicators: Moaning, Sore Pain Intervention(s): Monitored during session, Limited activity within patient's tolerance    Home Living                          Prior Function            PT Goals (current goals can now be found in the care plan section) Acute Rehab PT Goals Patient Stated Goal: to get to bathroom PT Goal Formulation: With patient Time For Goal Achievement: 02/05/22    Frequency    Min 2X/week      PT Plan      Co-evaluation              AM-PAC PT "6 Clicks" Mobility   Outcome Measure  Help needed turning from your back to your side while in a flat bed without using bedrails?: Total Help needed moving from lying on your back to sitting on the side of a flat bed without using bedrails?: Total Help needed moving to and from a bed to a chair (including a wheelchair)?: Total Help needed standing up from a chair using your arms (e.g., wheelchair or bedside chair)?: Total Help needed to walk in hospital room?: Total Help needed climbing 3-5 steps with a railing? : Total 6 Click Score: 6    End of Session     Patient left: in bed;with call bell/phone within reach Nurse Communication: Mobility status PT Visit Diagnosis: Adult, failure to thrive (R62.7);History of falling (Z91.81);Muscle weakness (generalized) (M62.81)     Time: 1657-9038 PT Time Calculation (min) (ACUTE ONLY): 19 min  Charges:  $Therapeutic Activity: 8-22 mins                     Lynnsey Barbara R. PTA Acute Rehabilitation Services Office: [place] 01/25/2022, 4:23 PM

## 2022-01-25 NOTE — Progress Notes (Signed)
Patients son stated he would like to speak to MD about pts xanax order. Son stated pt has been taking '3mg'$  xanax per day for the last 30 years. MD notified with son Randell Patient number [phone]. MD stated he will speak to son in the morning. Son has left the unit.  Daymon Larsen, RN

## 2022-01-25 NOTE — Progress Notes (Signed)
PROGRESS NOTE  Derek Anon Ringley Jr.  TFT:[id number] DOB: 28-Oct-1950 DOA: 01/15/2022 PCP: [name], FNP   Brief Narrative:  Patient is a 71 year old male with history of hepatitis C, liver cirrhosis, chronic oxycodone use who presented with confusion, low blood pressure, abdominal pain from [facility] health care.  He was recently discharged on 01/04/2022 after being admitted for septic shock due to acute cholecystitis requiring percutaneous biliary drain.  Patient was also found with confusion presentation.  On presentation he was hypotensive and required ICU admission, transferred to [facility] service on 6/21.  He was found to have left toe infection, multiple pressure ulcers concerning for sepsis.  Vascular surgery consulted.  Hospital course also remarkable for severe thrombocytopenia needing platelet transfusion.  Vascular surgery, hematology following.  Vascular surgery planning for amputation versus revascularization of the left lower extremity after improvement in the platelet level.   Assessment & Plan:  Principal Problem:   Acute encephalopathy Active Problems:   Septic shock (HCC)  Septic shock secondary to left toe infection/multiple pressure ulcers: Hypotensive on presentation, suspected to have sepsis.  Started on broad-spectrum antibiotics.  Blood cultures have been negative so far.  MRI of the left lower extremity did not show any osteomyelitis.  Continue broad-spectrum antibiotics for now.  Left lower limb ischemia/peripheral artery disease: Vascular surgery planning for revascularization versus amputation pending improvement in the platelet level.  Pancytopenia/thrombocytopenia: Hematology was following.  Suspected to be from cirrhosis.  He has chronic  thrombo-cytopenia.  Platelet level was very low in the range of 10 K.  Low WBC.  Total of 5 units of platelets transfusion.  Platelet in the range of 17,000 today.  Low suspicion for TTP, DIC, HIT, ITP.  Monitor hemoglobin, no  evidence of acute blood loss.  We will continue to monitor  Acute metabolic encephalopathy: Likely secondary to sepsis/AKI/hyperammonemia.  He has history of hepatic encephalopathy.  Continue lactulose.  Currently alert, oriented.  New onset A-fib: Now in normal sinus rhythm, spontaneously converted after started on Cardizem drip.  Cardiology was following, currently on Cardizem 360 mg daily.  Not a candidate for anticoagulant due to thrombocytopenia.  Monitor on telemetry.  History of hepatitis C/cirrhosis: On Lasix 80 mg daily.  Has history of hepatic encephalopathy. Has severe scrotal edema.  Continue lactulose.  Hypothyroidism: On Synthyroid  AKI: Resolved.   Chronic pain syndrome/anxiety: On Opiods,xanax at baseline  Hypokalemia/hypomagnesemia: Supplement and monitor as needed  Goals of care: Poor prognosis due to multiple comorbidities.  Palliative care was consulted.  Remains full code         Pressure Injury 01/03/22 Toe (Comment  which one) Anterior;Left Unstageable - Full thickness tissue loss in which the base of the injury is covered by slough (yellow, tan, gray, green or brown) and/or eschar (tan, brown or black) in the wound bed. (Active)  01/03/22 1000  Location: Toe (Comment  which one)  Location Orientation: Anterior;Left  Staging: Unstageable - Full thickness tissue loss in which the base of the injury is covered by slough (yellow, tan, gray, green or brown) and/or eschar (tan, brown or black) in the wound bed.  Wound Description (Comments):   Present on Admission:   Dressing Type Gauze (Comment) 01/25/22 0800     Pressure Injury 01/03/22 Sacrum Mid Unstageable - Full thickness tissue loss in which the base of the injury is covered by slough (yellow, tan, gray, green or brown) and/or eschar (tan, brown or black) in the wound bed. (Active)  01/03/22 1000  Location:  Sacrum  Location Orientation: Mid  Staging: Unstageable - Full thickness tissue loss in which the  base of the injury is covered by slough (yellow, tan, gray, green or brown) and/or eschar (tan, brown or black) in the wound bed.  Wound Description (Comments):   Present on Admission:   Dressing Type Foam - Lift dressing to assess site every shift 01/24/22 0900     Pressure Injury 01/03/22 Coccyx Mid Unstageable - Full thickness tissue loss in which the base of the injury is covered by slough (yellow, tan, gray, green or brown) and/or eschar (tan, brown or black) in the wound bed. (Active)  01/03/22 1000  Location: Coccyx  Location Orientation: Mid  Staging: Unstageable - Full thickness tissue loss in which the base of the injury is covered by slough (yellow, tan, gray, green or brown) and/or eschar (tan, brown or black) in the wound bed.  Wound Description (Comments):   Present on Admission:   Dressing Type Foam - Lift dressing to assess site every shift 01/24/22 0900     Pressure Injury 01/03/22 Ankle Anterior;Left Deep Tissue Pressure Injury - Purple or maroon localized area of discolored intact skin or blood-filled blister due to damage of underlying soft tissue from pressure and/or shear. (Active)  01/03/22 1000  Location: Ankle  Location Orientation: Anterior;Left  Staging: Deep Tissue Pressure Injury - Purple or maroon localized area of discolored intact skin or blood-filled blister due to damage of underlying soft tissue from pressure and/or shear.  Wound Description (Comments):   Present on Admission:   Dressing Type Foam - Lift dressing to assess site every shift 01/24/22 0900     Pressure Injury 01/15/22 Pretibial Distal;Left Unstageable - Full thickness tissue loss in which the base of the injury is covered by slough (yellow, tan, gray, green or brown) and/or eschar (tan, brown or black) in the wound bed. unstageable wound with  (Active)  01/15/22 2010  Location: Pretibial  Location Orientation: Distal;Left  Staging: Unstageable - Full thickness tissue loss in which the base  of the injury is covered by slough (yellow, tan, gray, green or brown) and/or eschar (tan, brown or black) in the wound bed.  Wound Description (Comments): unstageable wound with drainage  Present on Admission: Yes  Dressing Type Foam - Lift dressing to assess site every shift 01/25/22 0800    DVT prophylaxis:SCDs Start: 01/15/22 1859     Code Status: Full Code  Family Communication: Discussed with son at bedside on 6/28  Patient status:Inpatient  Patient is from :SNF  Anticipated discharge to:SNF  Estimated DC date:Not sure   Consultants: Vascular surgery, oncology  Procedures: None yet  Antimicrobials:  Anti-infectives (From admission, onward)    Start     Dose/Rate Route Frequency Ordered Stop   01/16/22 1400  vancomycin (VANCOCIN) IVPB 1000 mg/200 mL premix  Status:  Discontinued        1,000 mg 200 mL/hr over 60 Minutes Intravenous Every 24 hours 01/15/22 1923 01/16/22 0747   01/16/22 1100  metroNIDAZOLE (FLAGYL) IVPB 500 mg        500 mg 100 mL/hr over 60 Minutes Intravenous Every 12 hours 01/16/22 1004     01/16/22 1000  vancomycin (VANCOCIN) IVPB 1000 mg/200 mL premix        1,000 mg 200 mL/hr over 60 Minutes Intravenous Every 12 hours 01/16/22 0747     01/16/22 0845  ceFEPIme (MAXIPIME) 2 g in sodium chloride 0.9 % 100 mL IVPB  2 g 200 mL/hr over 30 Minutes Intravenous Every 8 hours 01/16/22 0757     01/15/22 2200  ceFEPIme (MAXIPIME) 2 g in sodium chloride 0.9 % 100 mL IVPB  Status:  Discontinued        2 g 200 mL/hr over 30 Minutes Intravenous Every 12 hours 01/15/22 1923 01/16/22 0757   01/15/22 1430  vancomycin (VANCOREADY) IVPB 2000 mg/400 mL        2,000 mg 200 mL/hr over 120 Minutes Intravenous  Once 01/15/22 1425 01/15/22 1746   01/15/22 1415  ceFEPIme (MAXIPIME) 2 g in sodium chloride 0.9 % 100 mL IVPB        2 g 200 mL/hr over 30 Minutes Intravenous  Once 01/15/22 1412 01/15/22 1528   01/15/22 1415  metroNIDAZOLE (FLAGYL) IVPB 500 mg         500 mg 100 mL/hr over 60 Minutes Intravenous  Once 01/15/22 1412 01/15/22 1529   01/15/22 1415  vancomycin (VANCOCIN) IVPB 1000 mg/200 mL premix  Status:  Discontinued        1,000 mg 200 mL/hr over 60 Minutes Intravenous  Once 01/15/22 1412 01/15/22 1420       Subjective: Patient seen and examined at the bedside this morning.  He was sleepy, lying in bed.  Son at the bedside.  No new complaints  Objective: Vitals:   01/24/22 2335 01/25/22 0348 01/25/22 0820 01/25/22 1135  BP: (!) 112/51 (!) 109/52 122/64 (!) 116/52  Pulse: 82 82 87 87  Resp: '12 16 12 10  '$ Temp: 98.3 F (36.8 C) 98.3 F (36.8 C) 97.9 F (36.6 C) 97.6 F (36.4 C)  TempSrc: Oral Oral Oral Oral  SpO2: 98% 96% 96% 100%  Weight:      Height:        Intake/Output Summary (Last 24 hours) at 01/25/2022 1209 Last data filed at 01/25/2022 0544 Gross per 24 hour  Intake 1379.5 ml  Output 2100 ml  Net -720.5 ml   Filed Weights   01/23/22 0500 01/23/22 2128 01/24/22 0415  Weight: 97 kg 96.8 kg 97.6 kg    Examination:  General exam: Overall comfortable, not in distress, deconditioned, obese, lying in bed HEENT: PERRL Respiratory system:  no wheezes or crackles  Cardiovascular system: S1 & S2 heard, RRR.  Gastrointestinal system: Abdomen is nondistended, soft and nontender. Central nervous system: Sleepy/drowsy Extremities: No edema, no clubbing ,no cyanosis, right AKA, ulcer on the back of the left leg and left toe Skin: No rashes, no icterus     Data Reviewed: I have personally reviewed following labs and imaging studies  CBC: Recent Labs  Lab 01/20/22 0456 01/21/22 0340 01/22/22 0430 01/23/22 0536 01/24/22 0800 01/25/22 0620  WBC 3.3* 2.2* 1.6* 2.3* 2.2* 2.1*  NEUTROABS 2.2 1.3* 0.7* 0.9* 0.8*  --   HGB 9.5* 8.8* 8.2* 8.2* 7.4* 7.1*  HCT 29.5* 26.9* 25.7* 25.2* 22.1* 22.0*  MCV 92.8 92.4 94.1 93.3 92.1 94.0  PLT 18* 14* 11* 12* 10* 17*   Basic Metabolic Panel: Recent Labs  Lab 01/19/22 0448  01/20/22 0456 01/21/22 0340 01/22/22 0430 01/22/22 0843 01/23/22 0536  NA 142 138 138  --  138 136  K 3.3* 3.3* 3.2*  --  3.5 3.7  CL 110 107 108  --  106 104  CO2 '23 22 22  '$ --  24 26  GLUCOSE 176* 164* 163*  --  149* 126*  BUN 28* 24* 22  --  17 14  CREATININE 0.39* 0.45* 0.41*  0.35* 0.42* 0.34*  CALCIUM 9.5 9.5 9.0  --  8.7* 8.4*  MG 1.7 1.8  --   --  1.3*  --      Recent Results (from the past 240 hour(s))  Blood Culture (routine x 2)     Status: None   Collection Time: 01/15/22  2:11 PM   Specimen: BLOOD  Result Value Ref Range Status   Specimen Description   Final    BLOOD BLOOD LEFT HAND Performed at [facility] [address] [place], [place] [address]    Special Requests   Final    BOTTLES DRAWN AEROBIC AND ANAEROBIC Blood Culture results may not be optimal due to an inadequate volume of blood received in culture bottles Performed at [facility] [address] [place], [place] [address]    Culture   Final    NO GROWTH 5 DAYS Performed at [facility], [place] [address], [place], [place] [address]    Report Status 01/20/2022 FINAL  Final  Urine Culture     Status: None   Collection Time: 01/15/22  2:12 PM   Specimen: In/Out Cath Urine  Result Value Ref Range Status   Specimen Description   Final    IN/OUT CATH URINE Performed at [facility] [address] [place], [place] [address]    Special Requests   Final    NONE Performed at [facility], [place] [address] [place], [place] [address]    Culture   Final    NO GROWTH Performed at [facility], [place] [address] [place], [place] [address]    Report Status 01/16/2022 FINAL  Final  Resp Panel by RT-PCR (Flu A&B, Covid) Anterior Nasal Swab     Status: None   Collection Time: 01/15/22  3:22 PM   Specimen: Anterior Nasal Swab  Result Value Ref Range Status   SARS Coronavirus 2 by RT PCR NEGATIVE NEGATIVE Final     Comment: (NOTE) SARS-CoV-2 target nucleic acids are NOT DETECTED.  The SARS-CoV-2 RNA is generally detectable in upper respiratory specimens during the acute phase of infection. The lowest concentration of SARS-CoV-2 viral copies this assay can detect is 138 copies/mL. A negative result does not preclude SARS-Cov-2 infection and should not be used as the sole basis for treatment or other patient management decisions. A negative result may occur with  improper specimen collection/handling, submission of specimen other than nasopharyngeal swab, presence of viral mutation(s) within the areas targeted by this assay, and inadequate number of viral copies(<138 copies/mL). A negative result must be combined with clinical observations, patient history, and epidemiological information. The expected result is Negative.  Fact Sheet for Patients:  EntrepreneurPulse.com.au  Fact Sheet for Healthcare Providers:  IncredibleEmployment.be  This test is no t yet approved or cleared by the [place] FDA and  has been authorized for detection and/or diagnosis of SARS-CoV-2 by FDA under an Emergency Use Authorization (EUA). This EUA will remain  in effect (meaning this test can be used) for the duration of the COVID-19 declaration under Section 564(b)(1) of the Act, 21 U.S.C.section 360bbb-3(b)(1), unless the authorization is terminated  or revoked sooner.       Influenza A by PCR NEGATIVE NEGATIVE Final   Influenza B by PCR NEGATIVE NEGATIVE Final    Comment: (NOTE) The Xpert Xpress SARS-CoV-2/FLU/RSV plus assay is intended as an aid in the diagnosis of influenza from Nasopharyngeal swab specimens and should not be  used as a sole basis for treatment. Nasal washings and aspirates are unacceptable for Xpert Xpress SARS-CoV-2/FLU/RSV testing.  Fact Sheet for Patients: EntrepreneurPulse.com.au  Fact Sheet for Healthcare  Providers: IncredibleEmployment.be  This test is not yet approved or cleared by the [place] FDA and has been authorized for detection and/or diagnosis of SARS-CoV-2 by FDA under an Emergency Use Authorization (EUA). This EUA will remain in effect (meaning this test can be used) for the duration of the COVID-19 declaration under Section 564(b)(1) of the Act, 21 U.S.C. section 360bbb-3(b)(1), unless the authorization is terminated or revoked.  Performed at [facility], [place] [address] [place], [place] [address]   MRSA Next Gen by PCR, Nasal     Status: None   Collection Time: 01/15/22  8:21 PM   Specimen: Nasal Mucosa; Nasal Swab  Result Value Ref Range Status   MRSA by PCR Next Gen NOT DETECTED NOT DETECTED Final    Comment: (NOTE) The GeneXpert MRSA Assay (FDA approved for NASAL specimens only), is one component of a comprehensive MRSA colonization surveillance program. It is not intended to diagnose MRSA infection nor to guide or monitor treatment for MRSA infections. Test performance is not FDA approved in patients less than 45 years old. Performed at [facility], [place] [address], [place], [place] [address]   Blood Culture (routine x 2)     Status: None   Collection Time: 01/15/22  9:31 PM   Specimen: BLOOD RIGHT HAND  Result Value Ref Range Status   Specimen Description   Final    BLOOD RIGHT HAND Performed at [facility] [address], [place], [place] [address]    Special Requests   Final    BOTTLES DRAWN AEROBIC ONLY Blood Culture adequate volume Performed at [facility] [address], [place], [place] [address]    Culture   Final    NO GROWTH 5 DAYS Performed at [facility], [place] [address], [place], [place] [address]    Report Status 01/20/2022 FINAL  Final  Body fluid culture w Gram Stain     Status: None   Collection Time: 01/16/22  2:20 AM    Specimen: Gallbladder; Body Fluid  Result Value Ref Range Status   Specimen Description   Final    GALL BLADDER Performed at [facility] [address], [place], [place] [address]    Special Requests   Final    NONE Performed at [facility], [place] [address], [place], [place] [address]    Gram Stain   Final    NO SQUAMOUS EPITHELIAL CELLS SEEN NO WBC SEEN FEW GRAM POSITIVE RODS MODERATE GRAM NEGATIVE RODS MODERATE GRAM POSITIVE COCCI    Culture   Final    ABUNDANT ESCHERICHIA COLI ABUNDANT PSEUDOMONAS AERUGINOSA MODERATE PSEUDOMONAS AERUGINOSA TWO DIFFERENT MORPHOLOGIES TWO DIFFERENT SUSCEPTIBILITY PATTERNS Performed at [facility], [place] [address] [place],  [address]    Report Status 01/19/2022 FINAL  Final   Organism ID, Bacteria ESCHERICHIA COLI  Final   Organism ID, Bacteria PSEUDOMONAS AERUGINOSA  Final   Organism ID, Bacteria PSEUDOMONAS AERUGINOSA  Final      Susceptibility   Escherichia coli - MIC*    AMPICILLIN >=32 RESISTANT Resistant     CEFAZOLIN 8 SENSITIVE Sensitive     CEFEPIME <=0.12 SENSITIVE Sensitive     CEFTAZIDIME <=1 SENSITIVE Sensitive     CEFTRIAXONE 0.5 SENSITIVE Sensitive  CIPROFLOXACIN <=0.25 SENSITIVE Sensitive     GENTAMICIN <=1 SENSITIVE Sensitive     IMIPENEM <=0.25 SENSITIVE Sensitive     TRIMETH/SULFA <=20 SENSITIVE Sensitive     AMPICILLIN/SULBACTAM 16 INTERMEDIATE Intermediate     PIP/TAZO 8 SENSITIVE Sensitive     * ABUNDANT ESCHERICHIA COLI   Pseudomonas aeruginosa - MIC*    CEFTAZIDIME 4 SENSITIVE Sensitive     CIPROFLOXACIN <=0.25 SENSITIVE Sensitive     GENTAMICIN <=1 SENSITIVE Sensitive     IMIPENEM 1 SENSITIVE Sensitive     PIP/TAZO 8 SENSITIVE Sensitive     CEFEPIME 2 SENSITIVE Sensitive    Pseudomonas aeruginosa - MIC*    CEFTAZIDIME 4 SENSITIVE Sensitive     CIPROFLOXACIN <=0.25 SENSITIVE Sensitive     GENTAMICIN 2 SENSITIVE Sensitive     IMIPENEM 2 SENSITIVE  Sensitive     PIP/TAZO <=4 SENSITIVE Sensitive     CEFEPIME 2 SENSITIVE Sensitive     * ABUNDANT PSEUDOMONAS AERUGINOSA    MODERATE PSEUDOMONAS AERUGINOSA     Radiology Studies: No results found.  Scheduled Meds:  sodium chloride   Intravenous Once   ALPRAZolam  2 mg Oral QHS   Chlorhexidine Gluconate Cloth  6 each Topical Daily   diltiazem  360 mg Oral Daily   fentaNYL  1 patch Transdermal Q72H   furosemide  80 mg Oral Daily   insulin aspart  0-15 Units Subcutaneous Q4H   lactulose  30 g Oral TID   levothyroxine  50 mcg Oral QAC breakfast   pantoprazole (PROTONIX) IV  40 mg Intravenous Q12H   sodium chloride flush  10-40 mL Intracatheter Q12H   Continuous Infusions:  sodium chloride 10 mL/hr at 01/22/22 0600   ceFEPime (MAXIPIME) IV 2 g (01/25/22 0546)   metronidazole 500 mg (01/25/22 0944)   vancomycin 1,000 mg (01/25/22 1058)     LOS: 10 days   Shelly Coss, MD [facility] P6/29/2023, 12:09 PM

## 2022-01-25 NOTE — TOC Initial Note (Signed)
Transition of Care (TOC) - Initial/Assessment Note    Patient Details  Name: [name]. MRN: [id number] Date of Birth: [birth year]  Transition of Care [facility]) CM/SW Contact:    Vinie Sill, LCSW Phone Number: 01/25/2022, 2:48 PM  Clinical Narrative:                  CSW met with patient's son,Jack while visiting with the patient. CSW introduced self and explained role. Family confirmed patient was from [facility]. Patient has been at [facility] since 01/04/2022. He requested to have SNF referrals sent out so they are aware  of all of their options for SNF placement. CSW explained the SNF process. Family states understanding.  TOC will continue to follow and assist with discharge planning. TOC will provide bed offers once available.   Thurmond Butts, MSW, LCSW Clinical Social Worker    Expected Discharge Plan: Skilled Nursing Facility Barriers to Discharge: Continued Medical Work up, SNF Pending bed offer   Patient Goals and CMS Choice Patient states their goals for this hospitalization and ongoing recovery are:: Pt Oriented to self   Choice offered to / list presented to : Adult Children  Expected Discharge Plan and Services Expected Discharge Plan: [place] In-house Referral: Clinical Social Work     Living arrangements for the past 2 months: [place]                                      Prior Living Arrangements/Services Living arrangements for the past 2 months: [place] Lives with:: Facility Resident Patient language and need for interpreter reviewed:: No Do you feel safe going back to the place where you live?: Yes      Need for Family Participation in Patient Care: Yes (Comment) Care giver support system in place?: Yes (comment)      Activities of Daily Living Home Assistive Devices/Equipment: Wheelchair, Hospital bed ADL Screening (condition at time of admission) Patient's cognitive  ability adequate to safely complete daily activities?: No Is the patient deaf or have difficulty hearing?: No Does the patient have difficulty seeing, even when wearing glasses/contacts?: No Does the patient have difficulty concentrating, remembering, or making decisions?: Yes Patient able to express need for assistance with ADLs?: No Does the patient have difficulty dressing or bathing?: Yes Independently performs ADLs?: No Communication: Needs assistance Is this a change from baseline?: Pre-admission baseline Dressing (OT): Needs assistance Is this a change from baseline?: Pre-admission baseline Grooming: Needs assistance Is this a change from baseline?: Pre-admission baseline Feeding: Needs assistance Is this a change from baseline?: Pre-admission baseline Bathing: Needs assistance Is this a change from baseline?: Pre-admission baseline Toileting: Needs assistance Is this a change from baseline?: Pre-admission baseline In/Out Bed: Needs assistance Is this a change from baseline?: Pre-admission baseline Walks in Home: Needs assistance Is this a change from baseline?: Pre-admission baseline Does the patient have difficulty walking or climbing stairs?: Yes Weakness of Legs: Both Weakness of Arms/Hands: Both  Permission Sought/Granted                  Emotional Assessment Appearance:: Appears stated age     Orientation: : Oriented to Self, Oriented to Place, Oriented to  Time, Oriented to Situation Alcohol / Substance Use: Not Applicable Psych Involvement: No (comment)  Admission diagnosis:  Transient alteration of awareness [R40.4] Acute encephalopathy [G93.40] Septic shock ([place]) [A41.9,  R65.21] Symptomatic anemia [D64.9] Acute renal failure, unspecified acute renal failure type [facility]) [N17.9] Patient Active Problem List   Diagnosis Date Noted   Acute encephalopathy 01/15/2022   Pressure injury of skin 01/04/2022   Somnolence 12/31/2021   Volume overload 12/30/2021    Cholecystitis    Drug rash    Acute pain of left thigh/rhabdomyolysis/myonecrosis 12/23/2021   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   GI bleed 12/23/2021   Scrotal edema 12/23/2021   SVT (supraventricular tachycardia) ([place]) 12/22/2021   Left leg weakness and numbness 12/21/2021   Calculus of bile duct with acute cholecystitis 12/21/2021   Elevated troponin 12/21/2021   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 25/36/6440   Thrombocytopenia ([place]) 12/20/2021   AKI (acute kidney injury) ([place]) 34/74/2595   Acute metabolic encephalopathy 63/87/5643   Elevated liver enzymes 12/20/2021   Hyperbilirubinemia 12/20/2021   Traumatic rhabdomyolysis ([place]) 12/20/2021   Hyperkalemia, hyponatremia and hypomagnesemia 12/20/2021   Hyperkalemia 12/20/2021   Hyponatremia 12/20/2021   Septic shock ([place]) 12/18/2021   Hypomagnesemia 12/16/2020   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    Gastric nodule    Duodenal erythema    Polyp of colon    Left leg swelling 12/14/2020   Eye trauma 02/29/2020   History of pancytopenia 02/29/2020   Portal venous hypertension ([place]) 02/29/2020   S/P AKA (above knee amputation) unilateral ([place]) 02/29/2020   Hepatic cirrhosis due to chronic hepatitis C infection ([place]) 02/29/2020   Goals of care, counseling/discussion 02/29/2020   Disorder of skeletal system 02/29/2020   Physical deconditioning 02/29/2020   HTN (hypertension) 09/14/2019   Hepatic encephalopathy ([place]) 04/07/2018   Osteoarthritis of left knee 02/26/2018   Chronic pain syndrome 11/23/2016   Hypothyroidism 09/25/2016   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   Hepatitis B core antibody positive 05/22/2016   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[place]) 04/14/2015   Cellulitis of right thigh 05/21/2012   Pancytopenia ([place]) 02/26/2012   Encounter for long-term (current) use of other medications 02/26/2012   Screening for prostate cancer 02/26/2012   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

## 2022-01-25 NOTE — Progress Notes (Signed)
Palliative:  [name] is sleeping soundly. No family at bedside. His wishes remain consistent and unchanging. I chose not to awaken him at this time.   No charge  Vinie Sill, NP Palliative Medicine Team Pager [phone] (Please see [email or web address] for schedule) Team Phone [phone]

## 2022-01-25 NOTE — Progress Notes (Signed)
Subjective  -   Complaining of generalized discomfort No acute events overnight   Physical Exam:  Left toe and leg ulcer unchanged       Assessment/Plan:    Patient's son was at the bedside this morning.  I again discussed our plans for above-knee amputation once his platelet count is over 50,000.  I discussed the risks of bleeding, death, and infection with even a amputation because of his underlying comorbidities.  They understand this risk but wished to proceed.  [name] 01/25/2022 9:17 AM --  Vitals:   01/25/22 0348 01/25/22 0820  BP: (!) 109/52 122/64  Pulse: 82 87  Resp: 16 12  Temp: 98.3 F (36.8 C) 97.9 F (36.6 C)  SpO2: 96% 96%    Intake/Output Summary (Last 24 hours) at 01/25/2022 0917 Last data filed at 01/25/2022 0544 Gross per 24 hour  Intake 1379.5 ml  Output 2800 ml  Net -1420.5 ml     Laboratory CBC    Component Value Date/Time   WBC 2.1 (L) 01/25/2022 0620   HGB 7.1 (L) 01/25/2022 0620   HGB 12.5 (L) 12/23/2013 1652   HGB 8.7 (L) 02/06/2011 1015   HCT 22.0 (L) 01/25/2022 0620   HCT 38.0 (L) 12/23/2013 1652   HCT 26.2 (L) 02/06/2011 1015   PLT 17 (LL) 01/25/2022 0620   PLT 50 (L) 12/23/2013 1652   PLT 24 (L) 02/06/2011 1015    BMET    Component Value Date/Time   NA 136 01/23/2022 0536   K 3.7 01/23/2022 0536   CL 104 01/23/2022 0536   CO2 26 01/23/2022 0536   GLUCOSE 126 (H) 01/23/2022 0536   BUN 14 01/23/2022 0536   CREATININE 0.34 (L) 01/23/2022 0536   CALCIUM 8.4 (L) 01/23/2022 0536   CALCIUM 9.8 02/26/2012 1428   GFRNONAA >60 01/23/2022 0536   GFRAA >60 04/06/2020 1507    COAG Lab Results  Component Value Date   INR 1.4 (H) 01/16/2022   INR >10.0 (HH) 01/15/2022   INR 1.4 (H) 01/15/2022   No results found for: "PTT"  Antibiotics Anti-infectives (From admission, onward)    Start     Dose/Rate Route Frequency Ordered Stop   01/16/22 1400  vancomycin (VANCOCIN) IVPB 1000 mg/200 mL premix  Status:   Discontinued        1,000 mg 200 mL/hr over 60 Minutes Intravenous Every 24 hours 01/15/22 1923 01/16/22 0747   01/16/22 1100  metroNIDAZOLE (FLAGYL) IVPB 500 mg        500 mg 100 mL/hr over 60 Minutes Intravenous Every 12 hours 01/16/22 1004     01/16/22 1000  vancomycin (VANCOCIN) IVPB 1000 mg/200 mL premix        1,000 mg 200 mL/hr over 60 Minutes Intravenous Every 12 hours 01/16/22 0747     01/16/22 0845  ceFEPIme (MAXIPIME) 2 g in sodium chloride 0.9 % 100 mL IVPB        2 g 200 mL/hr over 30 Minutes Intravenous Every 8 hours 01/16/22 0757     01/15/22 2200  ceFEPIme (MAXIPIME) 2 g in sodium chloride 0.9 % 100 mL IVPB  Status:  Discontinued        2 g 200 mL/hr over 30 Minutes Intravenous Every 12 hours 01/15/22 1923 01/16/22 0757   01/15/22 1430  vancomycin (VANCOREADY) IVPB 2000 mg/400 mL        2,000 mg 200 mL/hr over 120 Minutes Intravenous  Once 01/15/22 1425 01/15/22 1746   01/15/22  1415  ceFEPIme (MAXIPIME) 2 g in sodium chloride 0.9 % 100 mL IVPB        2 g 200 mL/hr over 30 Minutes Intravenous  Once 01/15/22 1412 01/15/22 1528   01/15/22 1415  metroNIDAZOLE (FLAGYL) IVPB 500 mg        500 mg 100 mL/hr over 60 Minutes Intravenous  Once 01/15/22 1412 01/15/22 1529   01/15/22 1415  vancomycin (VANCOCIN) IVPB 1000 mg/200 mL premix  Status:  Discontinued        1,000 mg 200 mL/hr over 60 Minutes Intravenous  Once 01/15/22 1412 01/15/22 1420        V. Leia Alf, M.D., [facility] [place] Office: [phone] Pager:  [phone]

## 2022-01-25 NOTE — Progress Notes (Signed)
Pharmacy Antibiotic Note  Derek Carpenter Hinchey [name]. is a 71 y.o. male admitted on 01/15/2022 with sepsis.  He was recently hospitalized with septic shock due to cholecystitis requiring percutaneous biliary drain and was discharged on 6/8. Pt also has left toe infection, vascular surgery consulted. Pharmacy has been consulted for cefepime and vancomycin dosing.  Currently on day #11 of antibiotics. WBC low at 2.1, afebrile. Scr 0.34 on last check 6/27. Vancomycin peak 24, vancomycin trough 12 - calculated AUC 425. Plan for AKA once plt count above 50k.    Plan: Continue cefepime 2 g IV q8h + metronidazole Continue vancomycin 1000 mg IV q12h Continue to monitor renal function for necessary dose adjustments and plan of care for appropriate de-escalation and length of therapy  Height: 6' (182.9 cm) Weight: 97.6 kg (215 lb 2.7 oz) IBW/kg (Calculated) : 77.6  Temp (24hrs), Avg:98.5 F (36.9 C), Min:97.9 F (36.6 C), Max:99.3 F (37.4 C)  Recent Labs  Lab 01/19/22 0919 01/19/22 1230 01/20/22 0456 01/21/22 0340 01/22/22 0430 01/22/22 0843 01/23/22 0536 01/24/22 0800 01/24/22 2300 01/25/22 0620  WBC  --   --  3.3* 2.2* 1.6*  --  2.3* 2.2*  --  2.1*  CREATININE  --   --  0.45* 0.41* 0.35* 0.42* 0.34*  --   --   --   VANCOTROUGH 14*  --   --   --   --   --   --   --   --   --   VANCOPEAK  --  36  --   --   --   --   --   --  24*  --      Estimated Creatinine Clearance: 101.1 mL/min (A) (by C-G formula based on SCr of 0.34 mg/dL (L)).    Allergies  Allergen Reactions   Clindamycin Dermatitis   Unasyn [Ampicillin-Sulbactam Sodium] Rash    Diffuse whole body rash 12/28/21 Tolerated cefepime 01/15/22   Antimicrobials this admission: Cefepime 6/19 >>  Metronidazole 6/19 >>  Vancomycin 6/19 >>  Microbiology results: 6/19 BCx: ngtd 6/19 UCx: ngF  6/20 gallbladder fluid: Abundant E.coli (R amp, I Unasyn); Abundant pseudomonas aeruginosa (pan-sensitive)  Antonietta Jewel, PharmD,  [place] Clinical Pharmacist  Phone: [phone] 01/25/2022 10:52 AM  Please check AMION for all [place] phone numbers After 10:00 PM, call [place] [phone]

## 2022-01-26 DIAGNOSIS — G9340 Encephalopathy, unspecified: Secondary | ICD-10-CM | POA: Diagnosis not present

## 2022-01-26 LAB — GLUCOSE, CAPILLARY
Glucose-Capillary: 145 mg/dL — ABNORMAL HIGH (ref 70–99)
Glucose-Capillary: 186 mg/dL — ABNORMAL HIGH (ref 70–99)
Glucose-Capillary: 122 mg/dL — ABNORMAL HIGH (ref 70–99)
Glucose-Capillary: 175 mg/dL — ABNORMAL HIGH (ref 70–99)
Glucose-Capillary: 110 mg/dL — ABNORMAL HIGH (ref 70–99)
Glucose-Capillary: 158 mg/dL — ABNORMAL HIGH (ref 70–99)

## 2022-01-26 LAB — PATHOLOGIST SMEAR REVIEW

## 2022-01-26 LAB — CBC
HCT: 25 % — ABNORMAL LOW (ref 39.0–52.0)
Hemoglobin: 8.1 g/dL — ABNORMAL LOW (ref 13.0–17.0)
MCH: 30.1 pg (ref 26.0–34.0)
MCHC: 32.4 g/dL (ref 30.0–36.0)
MCV: 92.9 fL (ref 80.0–100.0)
Platelets: 15 10*3/uL — CL (ref 150–400)
RBC: 2.69 MIL/uL — ABNORMAL LOW (ref 4.22–5.81)
RDW: 21.7 % — ABNORMAL HIGH (ref 11.5–15.5)
WBC: 2.6 10*3/uL — ABNORMAL LOW (ref 4.0–10.5)
nRBC: 0 % (ref 0.0–0.2)

## 2022-01-26 MED ORDER — SODIUM CHLORIDE 0.9% IV SOLUTION: Freq: Once | INTRAVENOUS | Status: AC

## 2022-01-26 NOTE — Progress Notes (Signed)
Palliative:  Returned call to son [name]. Concerns for changes to Mr. Leifheit Xanax. I spoke with [name] and he confirms that he had a chance to clarify these changes with hospitalist. We reviewed current Xanax orders and [name] is in agreement that this is appropriate for his father at this time. We reviewed low platelets as significant barrier to surgical intervention. He expressed continued hopes that he will be able to be optimized in order to proceed with intervention. We reviewed that we can continue to try and we will need to revisit goals of care and conversation if we continue without success. [name] understands the situation and continues to support and advocate for his father throughout this journey.   No charge  Vinie Sill, NP Palliative Medicine Team Pager [phone] (Please see [email or web address] for schedule) Team Phone [phone]

## 2022-01-26 NOTE — Progress Notes (Signed)
Found oxycodone bottle under pt's blanket. Counted with 2nd RN. There were 37 tablets of oxycodone. Sent it to pharmacy and placed the form in pt's chart.   Lavenia Atlas, RN

## 2022-01-26 NOTE — Progress Notes (Signed)
PROGRESS NOTE  Derek Anon Jasinski Jr.  PPJ:[id number] DOB: 09-28-1950 DOA: 01/15/2022 PCP: [name], FNP   Brief Narrative:  Patient is a 71 year old male with history of hepatitis C, liver cirrhosis, chronic oxycodone use who presented with confusion, low blood pressure, abdominal pain from [facility] health care.  He was recently discharged on 01/04/2022 after being admitted for septic shock due to acute cholecystitis requiring percutaneous biliary drain.  Patient was also found with confusion presentation.  On presentation he was hypotensive and required ICU admission, transferred to [facility] service on 6/21.  He was found to have left toe infection, multiple pressure ulcers concerning for sepsis.  Vascular surgery consulted.  Hospital course also remarkable for severe thrombocytopenia needing platelet transfusion.  Vascular surgery consulted as well as  hematology .  Vascular surgery planning for amputation versus revascularization of the left lower extremity after improvement in the platelet level.   Assessment & Plan:  Principal Problem:   Acute encephalopathy Active Problems:   Septic shock (HCC)  Septic shock secondary to left toe infection/multiple pressure ulcers: Hypotensive on presentation, suspected to have sepsis.  Started on broad-spectrum antibiotics.  Blood cultures have been negative so far.  MRI of the left lower extremity did not show any osteomyelitis.  Continue broad-spectrum antibiotics for now.  Left lower limb ischemia/peripheral artery disease: Vascular surgery planning for revascularization versus amputation pending improvement in the platelet level.  Pancytopenia/thrombocytopenia: Hematology was following.  Suspected to be from cirrhosis.  He has chronic  thrombo-cytopenia.  Platelet level was very low in the range of 10 K.  Low WBC.  Total of 7 units of platelets transfusion.  Platelet in the range of 15,000 today.  Low suspicion for TTP, DIC, HIT, ITP as per  hematology.  Monitor hemoglobin, no evidence of acute blood loss.  We will continue to monitor.Given another 2 units today, will continue transfusion until platelets reach to the level of 50,000.  Acute metabolic encephalopathy: Likely secondary to sepsis/AKI/hyperammonemia.  He has history of hepatic encephalopathy.Lactulose dose decreased due to diarrhoea.  Currently alert, oriented.  New onset A-fib: Now in normal sinus rhythm, spontaneously converted after started on Cardizem drip.  Cardiology was following, currently on Cardizem 360 mg daily.  Not a candidate for anticoagulant due to thrombocytopenia.  Monitor on telemetry.  History of hepatitis C/cirrhosis: On Lasix 80 mg daily.  Has history of hepatic encephalopathy. Has severe scrotal edema.  Continue lactulose.  Hypothyroidism: On Synthyroid  AKI: Resolved.   Chronic pain syndrome/anxiety: On Opiods,xanax at baseline  Hypokalemia/hypomagnesemia: Supplement and monitor as needed  Goals of care: Poor prognosis due to multiple comorbidities.  Palliative care was consulted.  Remains full code         Pressure Injury 01/03/22 Toe (Comment  which one) Anterior;Left Unstageable - Full thickness tissue loss in which the base of the injury is covered by slough (yellow, tan, gray, green or brown) and/or eschar (tan, brown or black) in the wound bed. (Active)  01/03/22 1000  Location: Toe (Comment  which one)  Location Orientation: Anterior;Left  Staging: Unstageable - Full thickness tissue loss in which the base of the injury is covered by slough (yellow, tan, gray, green or brown) and/or eschar (tan, brown or black) in the wound bed.  Wound Description (Comments):   Present on Admission:   Dressing Type Foam - Lift dressing to assess site every shift 01/26/22 0934     Pressure Injury 01/03/22 Sacrum Mid Unstageable - Full thickness tissue loss in  which the base of the injury is covered by slough (yellow, tan, gray, green or brown)  and/or eschar (tan, brown or black) in the wound bed. (Active)  01/03/22 1000  Location: Sacrum  Location Orientation: Mid  Staging: Unstageable - Full thickness tissue loss in which the base of the injury is covered by slough (yellow, tan, gray, green or brown) and/or eschar (tan, brown or black) in the wound bed.  Wound Description (Comments):   Present on Admission:   Dressing Type Foam - Lift dressing to assess site every shift 01/26/22 0934     Pressure Injury 01/03/22 Coccyx Mid Unstageable - Full thickness tissue loss in which the base of the injury is covered by slough (yellow, tan, gray, green or brown) and/or eschar (tan, brown or black) in the wound bed. (Active)  01/03/22 1000  Location: Coccyx  Location Orientation: Mid  Staging: Unstageable - Full thickness tissue loss in which the base of the injury is covered by slough (yellow, tan, gray, green or brown) and/or eschar (tan, brown or black) in the wound bed.  Wound Description (Comments):   Present on Admission:   Dressing Type Foam - Lift dressing to assess site every shift 01/26/22 0934     Pressure Injury 01/03/22 Ankle Anterior;Left Deep Tissue Pressure Injury - Purple or maroon localized area of discolored intact skin or blood-filled blister due to damage of underlying soft tissue from pressure and/or shear. (Active)  01/03/22 1000  Location: Ankle  Location Orientation: Anterior;Left  Staging: Deep Tissue Pressure Injury - Purple or maroon localized area of discolored intact skin or blood-filled blister due to damage of underlying soft tissue from pressure and/or shear.  Wound Description (Comments):   Present on Admission:   Dressing Type Foam - Lift dressing to assess site every shift 01/24/22 0900     Pressure Injury 01/15/22 Pretibial Distal;Left Unstageable - Full thickness tissue loss in which the base of the injury is covered by slough (yellow, tan, gray, green or brown) and/or eschar (tan, brown or black) in  the wound bed. unstageable wound with  (Active)  01/15/22 2010  Location: Pretibial  Location Orientation: Distal;Left  Staging: Unstageable - Full thickness tissue loss in which the base of the injury is covered by slough (yellow, tan, gray, green or brown) and/or eschar (tan, brown or black) in the wound bed.  Wound Description (Comments): unstageable wound with drainage  Present on Admission: Yes  Dressing Type Foam - Lift dressing to assess site every shift 01/26/22 0934    DVT prophylaxis:SCDs Start: 01/15/22 1859     Code Status: Full Code  Family Communication: Discussed with son at bedside on 6/28  Patient status:Inpatient  Patient is from :SNF  Anticipated discharge to:SNF  Estimated DC date:Not sure   Consultants: Vascular surgery, oncology  Procedures: None yet  Antimicrobials:  Anti-infectives (From admission, onward)    Start     Dose/Rate Route Frequency Ordered Stop   01/16/22 1400  vancomycin (VANCOCIN) IVPB 1000 mg/200 mL premix  Status:  Discontinued        1,000 mg 200 mL/hr over 60 Minutes Intravenous Every 24 hours 01/15/22 1923 01/16/22 0747   01/16/22 1100  metroNIDAZOLE (FLAGYL) IVPB 500 mg        500 mg 100 mL/hr over 60 Minutes Intravenous Every 12 hours 01/16/22 1004     01/16/22 1000  vancomycin (VANCOCIN) IVPB 1000 mg/200 mL premix        1,000 mg 200 mL/hr over 60 Minutes  Intravenous Every 12 hours 01/16/22 0747     01/16/22 0845  ceFEPIme (MAXIPIME) 2 g in sodium chloride 0.9 % 100 mL IVPB        2 g 200 mL/hr over 30 Minutes Intravenous Every 8 hours 01/16/22 0757     01/15/22 2200  ceFEPIme (MAXIPIME) 2 g in sodium chloride 0.9 % 100 mL IVPB  Status:  Discontinued        2 g 200 mL/hr over 30 Minutes Intravenous Every 12 hours 01/15/22 1923 01/16/22 0757   01/15/22 1430  vancomycin (VANCOREADY) IVPB 2000 mg/400 mL        2,000 mg 200 mL/hr over 120 Minutes Intravenous  Once 01/15/22 1425 01/15/22 1746   01/15/22 1415  ceFEPIme  (MAXIPIME) 2 g in sodium chloride 0.9 % 100 mL IVPB        2 g 200 mL/hr over 30 Minutes Intravenous  Once 01/15/22 1412 01/15/22 1528   01/15/22 1415  metroNIDAZOLE (FLAGYL) IVPB 500 mg        500 mg 100 mL/hr over 60 Minutes Intravenous  Once 01/15/22 1412 01/15/22 1529   01/15/22 1415  vancomycin (VANCOCIN) IVPB 1000 mg/200 mL premix  Status:  Discontinued        1,000 mg 200 mL/hr over 60 Minutes Intravenous  Once 01/15/22 1412 01/15/22 1420       Subjective: Patient seen and examined at the bedside this morning.  Hemodynamically stable.  Lying on weight.  Very deconditioned, weak.  Complains of feeling anxious today.  Not in any distress.  Objective: Vitals:   01/26/22 0406 01/26/22 0816 01/26/22 1143 01/26/22 1220  BP: (!) 120/59 (!) 122/57 (!) 123/54 (!) 123/48  Pulse: 87 80 89 86  Resp: '13 14 15 14  '$ Temp: 98.6 F (37 C) 98.2 F (36.8 C) 97.6 F (36.4 C) 98.1 F (36.7 C)  TempSrc: Oral Oral Oral Oral  SpO2: 95% 97% 98% 96%  Weight:      Height:        Intake/Output Summary (Last 24 hours) at 01/26/2022 1259 Last data filed at 01/26/2022 1143 Gross per 24 hour  Intake 1684.7 ml  Output 1825 ml  Net -140.3 ml   Filed Weights   01/23/22 0500 01/23/22 2128 01/24/22 0415  Weight: 97 kg 96.8 kg 97.6 kg    Examination:    General exam: Very deconditioned, chronically ill looking, lying in bed, obese HEENT: PERRL Respiratory system:  no wheezes or crackles  Cardiovascular system: S1 & S2 heard, RRR.  Gastrointestinal system: Abdomen is nondistended, soft and nontender.  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 Skin: No rashes, no icterus     Data Reviewed: I have personally reviewed following labs and imaging studies  CBC: Recent Labs  Lab 01/20/22 0456 01/21/22 0340 01/22/22 0430  01/23/22 0536 01/24/22 0800 01/25/22 0620 01/26/22 0427  WBC 3.3* 2.2* 1.6* 2.3* 2.2* 2.1* 2.6*  NEUTROABS 2.2 1.3* 0.7* 0.9* 0.8*  --   --   HGB 9.5* 8.8* 8.2* 8.2* 7.4* 7.1* 8.1*  HCT 29.5* 26.9* 25.7* 25.2* 22.1* 22.0* 25.0*  MCV 92.8 92.4 94.1 93.3 92.1 94.0 92.9  PLT 18* 14* 11* 12* 10* 17* 15*   Basic Metabolic Panel: Recent Labs  Lab 01/20/22 0456 01/21/22 0340 01/22/22 0430 01/22/22 0843 01/23/22 0536  NA 138 138  --  138 136  K 3.3* 3.2*  --  3.5 3.7  CL 107 108  --  106 104  CO2 22 22  --  24 26  GLUCOSE 164* 163*  --  149* 126*  BUN 24* 22  --  17 14  CREATININE 0.45* 0.41* 0.35* 0.42* 0.34*  CALCIUM 9.5 9.0  --  8.7* 8.4*  MG 1.8  --   --  1.3*  --      No results found for this or any previous visit (from the past 240 hour(s)).    Radiology Studies: No results found.  Scheduled Meds:  ALPRAZolam  2 mg Oral QHS   Chlorhexidine Gluconate Cloth  6 each Topical Daily   diltiazem  360 mg Oral Daily   fentaNYL  1 patch Transdermal Q72H   furosemide  80 mg Oral Daily   insulin aspart  0-15 Units Subcutaneous Q4H   lactulose  10 g Oral TID   levothyroxine  50 mcg Oral QAC breakfast   pantoprazole (PROTONIX) IV  40 mg Intravenous Q12H   sodium chloride flush  10-40 mL Intracatheter Q12H   Continuous Infusions:  sodium chloride 10 mL/hr at 01/22/22 0600   ceFEPime (MAXIPIME) IV 2 g (01/26/22 0437)   metronidazole 500 mg (01/26/22 0945)   vancomycin 1,000 mg (01/26/22 0946)     LOS: 11 days   Shelly Coss, MD [facility] P6/30/2023, 12:59 PM

## 2022-01-26 NOTE — Care Management Important Message (Signed)
Important Message  Patient Details  Name: [name]. MRN: [id number] Date of Birth: 04-Sep-1950   Medicare Important Message Given:  Yes     Shelda Altes 01/26/2022, 9:23 AM

## 2022-01-26 NOTE — Congregational Nurse Program (Signed)
Pt refused lactulose. Stated that he got big BM this am and did not want to take it today. MD aware.   Lavenia Atlas, RN

## 2022-01-27 DIAGNOSIS — G9340 Encephalopathy, unspecified: Secondary | ICD-10-CM | POA: Diagnosis not present

## 2022-01-27 LAB — PREPARE PLATELET PHERESIS
Unit division: 0
Unit division: 0

## 2022-01-27 LAB — BPAM PLATELET PHERESIS
Blood Product Expiration Date: 202307012359
Blood Product Expiration Date: 202307012359
ISSUE DATE / TIME: 202306301153
ISSUE DATE / TIME: 202306301711
Unit Type and Rh: 6200
Unit Type and Rh: 8400

## 2022-01-27 LAB — GLUCOSE, CAPILLARY
Glucose-Capillary: 124 mg/dL — ABNORMAL HIGH (ref 70–99)
Glucose-Capillary: 128 mg/dL — ABNORMAL HIGH (ref 70–99)
Glucose-Capillary: 129 mg/dL — ABNORMAL HIGH (ref 70–99)
Glucose-Capillary: 150 mg/dL — ABNORMAL HIGH (ref 70–99)
Glucose-Capillary: 151 mg/dL — ABNORMAL HIGH (ref 70–99)
Glucose-Capillary: 182 mg/dL — ABNORMAL HIGH (ref 70–99)

## 2022-01-27 LAB — CBC
HCT: 22.4 % — ABNORMAL LOW (ref 39.0–52.0)
Hemoglobin: 7.8 g/dL — ABNORMAL LOW (ref 13.0–17.0)
MCH: 32.4 pg (ref 26.0–34.0)
MCHC: 34.8 g/dL (ref 30.0–36.0)
MCV: 92.9 fL (ref 80.0–100.0)
Platelets: 18 10*3/uL — CL (ref 150–400)
RBC: 2.41 MIL/uL — ABNORMAL LOW (ref 4.22–5.81)
RDW: 22.5 % — ABNORMAL HIGH (ref 11.5–15.5)
WBC: 2.3 10*3/uL — ABNORMAL LOW (ref 4.0–10.5)
nRBC: 0 % (ref 0.0–0.2)

## 2022-01-27 MED ORDER — SODIUM CHLORIDE 0.9% IV SOLUTION: Freq: Once | INTRAVENOUS | Status: DC

## 2022-01-27 NOTE — Progress Notes (Signed)
Patient ID: Derek Rough., male   DOB: 07-Aug-1950, 71 y.o.   MRN: [id number]  Sleeping soundly this morning. Platelet count 18. Will do above knee amputation as soon as platelet count is > 50. Will continue to follow.   Yevonne Aline. Stanford Breed, MD [facility] Phone Number: [phone] 01/27/2022 9:05 AM

## 2022-01-27 NOTE — Progress Notes (Signed)
PROGRESS NOTE  Derek Anon Petrak Jr.  UUV:[id number] DOB: [birth year] DOA: 01/15/2022 PCP: [name], FNP   Brief Narrative:  Patient is a 71 year old male with history of hepatitis C, liver cirrhosis, chronic oxycodone use who presented with confusion, low blood pressure, abdominal pain from [facility] health care.  He was recently discharged on 01/04/2022 after being admitted for septic shock due to acute cholecystitis requiring percutaneous biliary drain.  Patient was also found with confusion presentation.  On presentation he was hypotensive and required ICU admission, transferred to [facility] service on 6/21.  He was found to have left toe infection, multiple pressure ulcers concerning for sepsis.  Vascular surgery consulted.  Hospital course also remarkable for severe thrombocytopenia needing platelet transfusion.  Vascular surgery consulted as well as  hematology .  Vascular surgery planning for amputation versus revascularization of the left lower extremity after improvement in the platelet level.   Assessment & Plan:  Principal Problem:   Acute encephalopathy Active Problems:   Septic shock (HCC)  Septic shock secondary to left toe infection/multiple pressure ulcers: Hypotensive on presentation, suspected to have sepsis.  Started on broad-spectrum antibiotics.  Blood cultures have been negative so far.  MRI of the left lower extremity did not show any osteomyelitis.  Continue broad-spectrum antibiotics for now.  Left lower limb ischemia/peripheral artery disease: Vascular surgery planning for revascularization versus amputation pending improvement in the platelet level.  Pancytopenia/thrombocytopenia: Hematology was following.  Suspected to be from cirrhosis.  He has chronic  thrombo-cytopenia.  Platelet level was very low in the range of 10 K.  Low WBC.  Total of 9 units of platelets transfusion.  Platelet in the range of 18,000 today.  Low suspicion for TTP, DIC, HIT, ITP as per  hematology.  Monitor hemoglobin, no evidence of acute blood loss.  We will continue to monitor.Given another 2 units today, will continue transfusion until platelets reach to the level of 50,000.  Acute metabolic encephalopathy: Likely secondary to sepsis/AKI/hyperammonemia.  He has history of hepatic encephalopathy.Lactulose dose decreased due to diarrhoea.  Currently alert, oriented.  New onset A-fib: Now in normal sinus rhythm, spontaneously converted after started on Cardizem drip.  Cardiology was following, currently on Cardizem 360 mg daily.  Not a candidate for anticoagulant due to thrombocytopenia.  Monitor on telemetry.  History of hepatitis C/cirrhosis: On Lasix 80 mg daily.  Has history of hepatic encephalopathy. Has severe scrotal edema.  Continue lactulose.  Hypothyroidism: On Synthyroid  AKI: Resolved.   Chronic pain syndrome/anxiety: On Opiods,xanax at baseline  Hypokalemia/hypomagnesemia: Supplement and monitor as needed  Goals of care: Poor prognosis due to multiple comorbidities.  Palliative care was consulted.  Remains full code         Pressure Injury 01/03/22 Toe (Comment  which one) Anterior;Left Unstageable - Full thickness tissue loss in which the base of the injury is covered by slough (yellow, tan, gray, green or brown) and/or eschar (tan, brown or black) in the wound bed. (Active)  01/03/22 1000  Location: Toe (Comment  which one)  Location Orientation: Anterior;Left  Staging: Unstageable - Full thickness tissue loss in which the base of the injury is covered by slough (yellow, tan, gray, green or brown) and/or eschar (tan, brown or black) in the wound bed.  Wound Description (Comments):   Present on Admission:   Dressing Type Foam - Lift dressing to assess site every shift 01/27/22 0940     Pressure Injury 01/03/22 Sacrum Mid Unstageable - Full thickness tissue loss in  which the base of the injury is covered by slough (yellow, tan, gray, green or brown)  and/or eschar (tan, brown or black) in the wound bed. (Active)  01/03/22 1000  Location: Sacrum  Location Orientation: Mid  Staging: Unstageable - Full thickness tissue loss in which the base of the injury is covered by slough (yellow, tan, gray, green or brown) and/or eschar (tan, brown or black) in the wound bed.  Wound Description (Comments):   Present on Admission:   Dressing Type Foam - Lift dressing to assess site every shift 01/27/22 0940     Pressure Injury 01/03/22 Coccyx Mid Unstageable - Full thickness tissue loss in which the base of the injury is covered by slough (yellow, tan, gray, green or brown) and/or eschar (tan, brown or black) in the wound bed. (Active)  01/03/22 1000  Location: Coccyx  Location Orientation: Mid  Staging: Unstageable - Full thickness tissue loss in which the base of the injury is covered by slough (yellow, tan, gray, green or brown) and/or eschar (tan, brown or black) in the wound bed.  Wound Description (Comments):   Present on Admission:   Dressing Type Foam - Lift dressing to assess site every shift 01/27/22 0940     Pressure Injury 01/03/22 Ankle Anterior;Left Deep Tissue Pressure Injury - Purple or maroon localized area of discolored intact skin or blood-filled blister due to damage of underlying soft tissue from pressure and/or shear. (Active)  01/03/22 1000  Location: Ankle  Location Orientation: Anterior;Left  Staging: Deep Tissue Pressure Injury - Purple or maroon localized area of discolored intact skin or blood-filled blister due to damage of underlying soft tissue from pressure and/or shear.  Wound Description (Comments):   Present on Admission:   Dressing Type Foam - Lift dressing to assess site every shift 01/27/22 0940     Pressure Injury 01/15/22 Pretibial Distal;Left Unstageable - Full thickness tissue loss in which the base of the injury is covered by slough (yellow, tan, gray, green or brown) and/or eschar (tan, brown or black) in  the wound bed. unstageable wound with  (Active)  01/15/22 2010  Location: Pretibial  Location Orientation: Distal;Left  Staging: Unstageable - Full thickness tissue loss in which the base of the injury is covered by slough (yellow, tan, gray, green or brown) and/or eschar (tan, brown or black) in the wound bed.  Wound Description (Comments): unstageable wound with drainage  Present on Admission: Yes  Dressing Type Foam - Lift dressing to assess site every shift 01/27/22 0940    DVT prophylaxis:SCDs Start: 01/15/22 1859     Code Status: Full Code  Family Communication: Discussed with son at bedside on 6/28  Patient status:Inpatient  Patient is from :SNF  Anticipated discharge to:SNF  Estimated DC date:Not sure   Consultants: Vascular surgery, oncology  Procedures: None yet  Antimicrobials:  Anti-infectives (From admission, onward)    Start     Dose/Rate Route Frequency Ordered Stop   01/16/22 1400  vancomycin (VANCOCIN) IVPB 1000 mg/200 mL premix  Status:  Discontinued        1,000 mg 200 mL/hr over 60 Minutes Intravenous Every 24 hours 01/15/22 1923 01/16/22 0747   01/16/22 1100  metroNIDAZOLE (FLAGYL) IVPB 500 mg        500 mg 100 mL/hr over 60 Minutes Intravenous Every 12 hours 01/16/22 1004     01/16/22 1000  vancomycin (VANCOCIN) IVPB 1000 mg/200 mL premix        1,000 mg 200 mL/hr over 60 Minutes  Intravenous Every 12 hours 01/16/22 0747     01/16/22 0845  ceFEPIme (MAXIPIME) 2 g in sodium chloride 0.9 % 100 mL IVPB        2 g 200 mL/hr over 30 Minutes Intravenous Every 8 hours 01/16/22 0757     01/15/22 2200  ceFEPIme (MAXIPIME) 2 g in sodium chloride 0.9 % 100 mL IVPB  Status:  Discontinued        2 g 200 mL/hr over 30 Minutes Intravenous Every 12 hours 01/15/22 1923 01/16/22 0757   01/15/22 1430  vancomycin (VANCOREADY) IVPB 2000 mg/400 mL        2,000 mg 200 mL/hr over 120 Minutes Intravenous  Once 01/15/22 1425 01/15/22 1746   01/15/22 1415  ceFEPIme  (MAXIPIME) 2 g in sodium chloride 0.9 % 100 mL IVPB        2 g 200 mL/hr over 30 Minutes Intravenous  Once 01/15/22 1412 01/15/22 1528   01/15/22 1415  metroNIDAZOLE (FLAGYL) IVPB 500 mg        500 mg 100 mL/hr over 60 Minutes Intravenous  Once 01/15/22 1412 01/15/22 1529   01/15/22 1415  vancomycin (VANCOCIN) IVPB 1000 mg/200 mL premix  Status:  Discontinued        1,000 mg 200 mL/hr over 60 Minutes Intravenous  Once 01/15/22 1412 01/15/22 1420       Subjective: Patient seen and examined the bedside this morning.  Hemodynamically stable lying in bed.  He is denying his lactulose.  I discussed about the importance of taking it.  Being given 2 units of platelets again today.  Objective: Vitals:   01/27/22 0020 01/27/22 0423 01/27/22 0736 01/27/22 1116  BP: 114/61 (!) 129/56 (!) 125/58 (!) 128/49  Pulse: 78 83 85 92  Resp: '13 17 16 18  '$ Temp: 98.3 F (36.8 C) 98 F (36.7 C) 98.2 F (36.8 C) 97.6 F (36.4 C)  TempSrc: Oral Oral Oral Oral  SpO2: 96% 91% 93% 98%  Weight:      Height:        Intake/Output Summary (Last 24 hours) at 01/27/2022 1147 Last data filed at 01/27/2022 1139 Gross per 24 hour  Intake 2980.93 ml  Output 3700 ml  Net -719.07 ml   Filed Weights   01/23/22 0500 01/23/22 2128 01/24/22 0415  Weight: 97 kg 96.8 kg 97.6 kg    Examination:    General exam: Very deconditioned, chronically ill looking, lying in bed, obese HEENT: PERRL Respiratory system:  no wheezes or crackles  Cardiovascular system: S1 & S2 heard, RRR.  Gastrointestinal system: Abdomen is nondistended, soft and nontender.  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 Skin: No rashes, no icterus     Data Reviewed: I have personally reviewed following labs and imaging studies  CBC: Recent Labs  Lab 01/21/22 0340 01/22/22 0430  01/23/22 0536 01/24/22 0800 01/25/22 0620 01/26/22 0427 01/27/22 0410  WBC 2.2* 1.6* 2.3* 2.2* 2.1* 2.6* 2.3*  NEUTROABS 1.3* 0.7* 0.9* 0.8*  --   --   --   HGB 8.8* 8.2* 8.2* 7.4* 7.1* 8.1* 7.8*  HCT 26.9* 25.7* 25.2* 22.1* 22.0* 25.0* 22.4*  MCV 92.4 94.1 93.3 92.1 94.0 92.9 92.9  PLT 14* 11* 12* 10* 17* 15* 18*   Basic Metabolic Panel: Recent Labs  Lab 01/21/22 0340 01/22/22 0430 01/22/22 3149  01/23/22 0536  NA 138  --  138 136  K 3.2*  --  3.5 3.7  CL 108  --  106 104  CO2 22  --  24 26  GLUCOSE 163*  --  149* 126*  BUN 22  --  17 14  CREATININE 0.41* 0.35* 0.42* 0.34*  CALCIUM 9.0  --  8.7* 8.4*  MG  --   --  1.3*  --      No results found for this or any previous visit (from the past 240 hour(s)).    Radiology Studies: No results found.  Scheduled Meds:  ALPRAZolam  2 mg Oral QHS   Chlorhexidine Gluconate Cloth  6 each Topical Daily   diltiazem  360 mg Oral Daily   fentaNYL  1 patch Transdermal Q72H   furosemide  80 mg Oral Daily   insulin aspart  0-15 Units Subcutaneous Q4H   lactulose  10 g Oral TID   levothyroxine  50 mcg Oral QAC breakfast   pantoprazole (PROTONIX) IV  40 mg Intravenous Q12H   sodium chloride flush  10-40 mL Intracatheter Q12H   Continuous Infusions:  sodium chloride 10 mL/hr at 01/27/22 1139   ceFEPime (MAXIPIME) IV Stopped (01/27/22 0546)   metronidazole Stopped (01/27/22 1102)   vancomycin 200 mL/hr at 01/27/22 1139     LOS: 12 days   Shelly Coss, MD [facility] P7/07/2021, 11:47 AM

## 2022-01-28 DIAGNOSIS — G9340 Encephalopathy, unspecified: Secondary | ICD-10-CM | POA: Diagnosis not present

## 2022-01-28 LAB — CBC
HCT: 22.2 % — ABNORMAL LOW (ref 39.0–52.0)
Hemoglobin: 7.3 g/dL — ABNORMAL LOW (ref 13.0–17.0)
MCH: 30.9 pg (ref 26.0–34.0)
MCHC: 32.9 g/dL (ref 30.0–36.0)
MCV: 94.1 fL (ref 80.0–100.0)
Platelets: 22 10*3/uL — CL (ref 150–400)
RBC: 2.36 MIL/uL — ABNORMAL LOW (ref 4.22–5.81)
RDW: 22.1 % — ABNORMAL HIGH (ref 11.5–15.5)
WBC: 1.8 10*3/uL — ABNORMAL LOW (ref 4.0–10.5)
nRBC: 0 % (ref 0.0–0.2)

## 2022-01-28 LAB — GLUCOSE, CAPILLARY
Glucose-Capillary: 116 mg/dL — ABNORMAL HIGH (ref 70–99)
Glucose-Capillary: 128 mg/dL — ABNORMAL HIGH (ref 70–99)
Glucose-Capillary: 132 mg/dL — ABNORMAL HIGH (ref 70–99)
Glucose-Capillary: 135 mg/dL — ABNORMAL HIGH (ref 70–99)
Glucose-Capillary: 145 mg/dL — ABNORMAL HIGH (ref 70–99)
Glucose-Capillary: 145 mg/dL — ABNORMAL HIGH (ref 70–99)
Glucose-Capillary: 147 mg/dL — ABNORMAL HIGH (ref 70–99)

## 2022-01-28 LAB — PREPARE PLATELET PHERESIS
Unit division: 0
Unit division: 0

## 2022-01-28 LAB — BPAM PLATELET PHERESIS
Blood Product Expiration Date: 202307022359
Blood Product Expiration Date: 202307032359
ISSUE DATE / TIME: 202307011221
ISSUE DATE / TIME: 202307011655
Unit Type and Rh: 6200
Unit Type and Rh: 8400

## 2022-01-28 LAB — BASIC METABOLIC PANEL
Anion gap: 11 (ref 5–15)
BUN: 8 mg/dL (ref 8–23)
CO2: 34 mmol/L — ABNORMAL HIGH (ref 22–32)
Calcium: 8.4 mg/dL — ABNORMAL LOW (ref 8.9–10.3)
Chloride: 95 mmol/L — ABNORMAL LOW (ref 98–111)
Creatinine, Ser: 0.47 mg/dL — ABNORMAL LOW (ref 0.61–1.24)
GFR, Estimated: >60 mL/min (ref >60)
Glucose, Bld: 156 mg/dL — ABNORMAL HIGH (ref 70–99)
Potassium: 2.6 mmol/L — CL (ref 3.5–5.1)
Sodium: 140 mmol/L (ref 135–145)

## 2022-01-28 LAB — MAGNESIUM: Magnesium: 1.2 mg/dL — ABNORMAL LOW (ref 1.7–2.4)

## 2022-01-28 LAB — AMMONIA: Ammonia: 28 umol/L (ref 9–35)

## 2022-01-28 LAB — POTASSIUM: Potassium: 2.9 mmol/L — ABNORMAL LOW (ref 3.5–5.1)

## 2022-01-28 MED ORDER — SPIRONOLACTONE 25 MG PO TABS
25.0000 mg | ORAL_TABLET | Freq: Every day | ORAL | Status: DC
  Administered 2022-01-28 – 2022-01-30 (×3): 25 mg via ORAL
  Filled 2022-01-28 (×3): qty 1

## 2022-01-28 MED ORDER — POTASSIUM CHLORIDE 10 MEQ/100ML IV SOLN
10.0000 meq | INTRAVENOUS | Status: DC
  Administered 2022-01-28: 10 meq via INTRAVENOUS
  Filled 2022-01-28: qty 100

## 2022-01-28 MED ORDER — POTASSIUM CHLORIDE CRYS ER 20 MEQ PO TBCR
40.0000 meq | EXTENDED_RELEASE_TABLET | ORAL | Status: AC
  Administered 2022-01-28 (×2): 40 meq via ORAL
  Filled 2022-01-28 (×2): qty 2

## 2022-01-28 MED ORDER — POTASSIUM CHLORIDE CRYS ER 20 MEQ PO TBCR
40.0000 meq | EXTENDED_RELEASE_TABLET | ORAL | Status: DC
  Administered 2022-01-28: 40 meq via ORAL
  Filled 2022-01-28: qty 2

## 2022-01-28 MED ORDER — HYDROMORPHONE HCL 1 MG/ML IJ SOLN
0.5000 mg | INTRAMUSCULAR | Status: DC | PRN
  Administered 2022-01-30 (×2): 0.5 mg via INTRAVENOUS
  Filled 2022-01-28 (×2): qty 0.5

## 2022-01-28 MED ORDER — MAGNESIUM SULFATE 4 GM/100ML IV SOLN
4.0000 g | Freq: Once | INTRAVENOUS | Status: AC
  Administered 2022-01-28: 4 g via INTRAVENOUS
  Filled 2022-01-28: qty 100

## 2022-01-28 MED ORDER — SODIUM CHLORIDE 0.9% IV SOLUTION: Freq: Once | INTRAVENOUS | Status: AC

## 2022-01-28 MED ORDER — POTASSIUM CHLORIDE 10 MEQ/100ML IV SOLN
10.0000 meq | INTRAVENOUS | Status: AC
  Administered 2022-01-28 – 2022-01-29 (×4): 10 meq via INTRAVENOUS
  Filled 2022-01-28 (×4): qty 100

## 2022-01-28 MED ORDER — POTASSIUM CHLORIDE CRYS ER 20 MEQ PO TBCR
40.0000 meq | EXTENDED_RELEASE_TABLET | Freq: Once | ORAL | Status: AC
  Administered 2022-01-28: 40 meq via ORAL
  Filled 2022-01-28: qty 2

## 2022-01-28 NOTE — Progress Notes (Addendum)
PROGRESS NOTE  Derek Anon Lauman Jr.  YQM:[id number] DOB: [birth year] DOA: 01/15/2022 PCP: [name], FNP   Brief Narrative:  Patient is a 71 year old male with history of hepatitis C, liver cirrhosis, chronic oxycodone use who presented with confusion, low blood pressure, abdominal pain from [facility] health care.  He was recently discharged on 01/04/2022 after being admitted for septic shock due to acute cholecystitis requiring percutaneous biliary drain.  Patient was also found with confusion presentation.  On presentation he was hypotensive and required ICU admission, transferred to [facility] service on 6/21.  He was found to have left toe infection, multiple pressure ulcers concerning for sepsis.  Vascular surgery consulted.  Hospital course also remarkable for severe thrombocytopenia needing platelet transfusion.  Vascular surgery consulted as well as  hematology .  Vascular surgery planning for amputation versus revascularization of the left lower extremity after improvement in the platelet level.   Assessment & Plan:  Principal Problem:   Acute encephalopathy Active Problems:   Septic shock (HCC)  Septic shock secondary to left toe infection/multiple pressure ulcers: Hypotensive on presentation, suspected to have sepsis.  Started on broad-spectrum antibiotics.  Blood cultures have been negative so far.  MRI of the left lower extremity did not show any osteomyelitis.  Continue broad-spectrum antibiotics for now.  Left lower limb ischemia/peripheral artery disease: Vascular surgery planning for revascularization versus amputation pending improvement in the platelet level.  Pancytopenia/thrombocytopenia: Hematology was following.  Suspected to be from cirrhosis.  He has chronic  thrombo-cytopenia.  Platelet level was very low in the range of 10 K.  Low WBC.  Total of 13 units of platelets transfusion.  Platelet in the range of 27,000 today.  Low suspicion for TTP, DIC, HIT, ITP as per  hematology.  Monitor hemoglobin, no evidence of acute blood loss.  We will continue to monitor.Given another 2 units today, will continue transfusion until platelets reach to the level of 50,000.   Acute metabolic encephalopathy: Likely secondary to sepsis/AKI/hyperammonemia.  He has history of hepatic encephalopathy.Lactulose dose decreased due to diarrhoea.  Currently alert, oriented.  New onset A-fib: Now in normal sinus rhythm, spontaneously converted after started on Cardizem drip.  Cardiology was following, currently on Cardizem 360 mg daily.  Not a candidate for anticoagulant due to thrombocytopenia.  Monitor on telemetry.  History of hepatitis C/cirrhosis: On Lasix 80 mg daily.  Has history of hepatic encephalopathy. Has severe scrotal edema.  Continue lactulose.  Hypothyroidism: On Synthyroid  AKI: Resolved.   Chronic pain syndrome/anxiety: On Opiods,xanax at baseline  Hypokalemia/hypomagnesemia: Supplement and monitor as needed  Goals of care: Poor prognosis due to multiple comorbidities.  Palliative care was consulted.  Remains full code. I again discussed goals of care with the son on phone on 7/2.  His platelet level have not shown improvement despite being transfused consistently.  If he remains thrombocytopenic, he cannot go for surgery, in that case hospice would be the best approach for him        Pressure Injury 01/03/22 Toe (Comment  which one) Anterior;Left Unstageable - Full thickness tissue loss in which the base of the injury is covered by slough (yellow, tan, gray, green or brown) and/or eschar (tan, brown or black) in the wound bed. (Active)  01/03/22 1000  Location: Toe (Comment  which one)  Location Orientation: Anterior;Left  Staging: Unstageable - Full thickness tissue loss in which the base of the injury is covered by slough (yellow, tan, gray, green or brown) and/or eschar (tan, brown  or black) in the wound bed.  Wound Description (Comments):   Present on  Admission:   Dressing Type Foam - Lift dressing to assess site every shift 01/28/22 0820     Pressure Injury 01/03/22 Sacrum Mid Unstageable - Full thickness tissue loss in which the base of the injury is covered by slough (yellow, tan, gray, green or brown) and/or eschar (tan, brown or black) in the wound bed. (Active)  01/03/22 1000  Location: Sacrum  Location Orientation: Mid  Staging: Unstageable - Full thickness tissue loss in which the base of the injury is covered by slough (yellow, tan, gray, green or brown) and/or eschar (tan, brown or black) in the wound bed.  Wound Description (Comments):   Present on Admission:   Dressing Type Foam - Lift dressing to assess site every shift 01/28/22 0820     Pressure Injury 01/03/22 Coccyx Mid Unstageable - Full thickness tissue loss in which the base of the injury is covered by slough (yellow, tan, gray, green or brown) and/or eschar (tan, brown or black) in the wound bed. (Active)  01/03/22 1000  Location: Coccyx  Location Orientation: Mid  Staging: Unstageable - Full thickness tissue loss in which the base of the injury is covered by slough (yellow, tan, gray, green or brown) and/or eschar (tan, brown or black) in the wound bed.  Wound Description (Comments):   Present on Admission:   Dressing Type Foam - Lift dressing to assess site every shift 01/28/22 0820     Pressure Injury 01/03/22 Ankle Anterior;Left Deep Tissue Pressure Injury - Purple or maroon localized area of discolored intact skin or blood-filled blister due to damage of underlying soft tissue from pressure and/or shear. (Active)  01/03/22 1000  Location: Ankle  Location Orientation: Anterior;Left  Staging: Deep Tissue Pressure Injury - Purple or maroon localized area of discolored intact skin or blood-filled blister due to damage of underlying soft tissue from pressure and/or shear.  Wound Description (Comments):   Present on Admission:   Dressing Type Foam - Lift dressing to  assess site every shift 01/28/22 0820     Pressure Injury 01/15/22 Pretibial Distal;Left Unstageable - Full thickness tissue loss in which the base of the injury is covered by slough (yellow, tan, gray, green or brown) and/or eschar (tan, brown or black) in the wound bed. unstageable wound with  (Active)  01/15/22 2010  Location: Pretibial  Location Orientation: Distal;Left  Staging: Unstageable - Full thickness tissue loss in which the base of the injury is covered by slough (yellow, tan, gray, green or brown) and/or eschar (tan, brown or black) in the wound bed.  Wound Description (Comments): unstageable wound with drainage  Present on Admission: Yes  Dressing Type Foam - Lift dressing to assess site every shift 01/28/22 0820    DVT prophylaxis:SCDs Start: 01/15/22 1859     Code Status: Full Code  Family Communication: Discussed with son on phone on 7/2  Patient status:Inpatient  Patient is from :SNF  Anticipated discharge to:SNF  Estimated DC date:Not sure   Consultants: Vascular surgery, oncology  Procedures: None yet  Antimicrobials:  Anti-infectives (From admission, onward)    Start     Dose/Rate Route Frequency Ordered Stop   01/16/22 1400  vancomycin (VANCOCIN) IVPB 1000 mg/200 mL premix  Status:  Discontinued        1,000 mg 200 mL/hr over 60 Minutes Intravenous Every 24 hours 01/15/22 1923 01/16/22 0747   01/16/22 1100  metroNIDAZOLE (FLAGYL) IVPB 500 mg  500 mg 100 mL/hr over 60 Minutes Intravenous Every 12 hours 01/16/22 1004     01/16/22 1000  vancomycin (VANCOCIN) IVPB 1000 mg/200 mL premix        1,000 mg 200 mL/hr over 60 Minutes Intravenous Every 12 hours 01/16/22 0747     01/16/22 0845  ceFEPIme (MAXIPIME) 2 g in sodium chloride 0.9 % 100 mL IVPB        2 g 200 mL/hr over 30 Minutes Intravenous Every 8 hours 01/16/22 0757     01/15/22 2200  ceFEPIme (MAXIPIME) 2 g in sodium chloride 0.9 % 100 mL IVPB  Status:  Discontinued        2 g 200 mL/hr  over 30 Minutes Intravenous Every 12 hours 01/15/22 1923 01/16/22 0757   01/15/22 1430  vancomycin (VANCOREADY) IVPB 2000 mg/400 mL        2,000 mg 200 mL/hr over 120 Minutes Intravenous  Once 01/15/22 1425 01/15/22 1746   01/15/22 1415  ceFEPIme (MAXIPIME) 2 g in sodium chloride 0.9 % 100 mL IVPB        2 g 200 mL/hr over 30 Minutes Intravenous  Once 01/15/22 1412 01/15/22 1528   01/15/22 1415  metroNIDAZOLE (FLAGYL) IVPB 500 mg        500 mg 100 mL/hr over 60 Minutes Intravenous  Once 01/15/22 1412 01/15/22 1529   01/15/22 1415  vancomycin (VANCOCIN) IVPB 1000 mg/200 mL premix  Status:  Discontinued        1,000 mg 200 mL/hr over 60 Minutes Intravenous  Once 01/15/22 1412 01/15/22 1420       Subjective: Patient seen and examined at the bedside this morning.  He appears comfortable today.  There is no new complaints.  He has been transfused with platelets.  I again discussed about his complex situation regarding no improvement on platelets level despite being on  transfusion.  objective: Vitals:   01/27/22 1927 01/27/22 2359 01/28/22 0353 01/28/22 0806  BP: (!) 115/50 (!) 107/52 (!) 112/59 (!) 115/55  Pulse: 80 79 87 86  Resp: '20 12 14 16  '$ Temp: 98.1 F (36.7 C) 98.3 F (36.8 C) 98.3 F (36.8 C) 98.3 F (36.8 C)  TempSrc: Oral Oral Oral Oral  SpO2: 95% 97% 96% 95%  Weight:      Height:        Intake/Output Summary (Last 24 hours) at 01/28/2022 1122 Last data filed at 01/28/2022 1103 Gross per 24 hour  Intake 2392.13 ml  Output 4000 ml  Net -1607.87 ml   Filed Weights   01/23/22 0500 01/23/22 2128 01/24/22 0415  Weight: 97 kg 96.8 kg 97.6 kg    Examination:   General exam: Very deconditioned, chronically ill looking, weak, lying in bed, obese  HEENT: PERRL Respiratory system:  no wheezes or crackles  Cardiovascular system: S1 & S2 heard, RRR.  Gastrointestinal system: Abdomen is nondistended, soft and nontender.  Right upper quadrant drain Central nervous system:  Alert and oriented Extremities: No edema, no clubbing ,no cyanosis, right AKA, left lower extremity ulcers, PICC line on the right arm Skin: No rashes, no ulcers,no icterus   GU: Scrotal edema  Data Reviewed: I have personally reviewed following labs and imaging studies  CBC: Recent Labs  Lab 01/22/22 0430 01/23/22 0536 01/24/22 0800 01/25/22 0620 01/26/22 0427 01/27/22 0410 01/28/22 0513  WBC 1.6* 2.3* 2.2* 2.1* 2.6* 2.3* 1.8*  NEUTROABS 0.7* 0.9* 0.8*  --   --   --   --   HGB  8.2* 8.2* 7.4* 7.1* 8.1* 7.8* 7.3*  HCT 25.7* 25.2* 22.1* 22.0* 25.0* 22.4* 22.2*  MCV 94.1 93.3 92.1 94.0 92.9 92.9 94.1  PLT 11* 12* 10* 17* 15* 18* 22*   Basic Metabolic Panel: Recent Labs  Lab 01/22/22 0430 01/22/22 0843 01/23/22 0536 01/28/22 0513  NA  --  138 136 140  K  --  3.5 3.7 2.6*  CL  --  106 104 95*  CO2  --  24 26 34*  GLUCOSE  --  149* 126* 156*  BUN  --  '17 14 8  '$ CREATININE 0.35* 0.42* 0.34* 0.47*  CALCIUM  --  8.7* 8.4* 8.4*  MG  --  1.3*  --  1.2*     No results found for this or any previous visit (from the past 240 hour(s)).    Radiology Studies: No results found.  Scheduled Meds:  sodium chloride   Intravenous Once   ALPRAZolam  2 mg Oral QHS   Chlorhexidine Gluconate Cloth  6 each Topical Daily   diltiazem  360 mg Oral Daily   fentaNYL  1 patch Transdermal Q72H   furosemide  80 mg Oral Daily   insulin aspart  0-15 Units Subcutaneous Q4H   lactulose  10 g Oral TID   levothyroxine  50 mcg Oral QAC breakfast   pantoprazole (PROTONIX) IV  40 mg Intravenous Q12H   potassium chloride  40 mEq Oral Q2H   sodium chloride flush  10-40 mL Intracatheter Q12H   Continuous Infusions:  sodium chloride 250 mL (01/27/22 2157)   ceFEPime (MAXIPIME) IV 2 g (01/28/22 0558)   magnesium sulfate bolus IVPB 4 g (01/28/22 0949)   metronidazole 500 mg (01/28/22 0834)   vancomycin 1,000 mg (01/28/22 0829)     LOS: 13 days   Shelly Coss, MD [facility] P7/08/2021,  11:22 AM

## 2022-01-28 NOTE — NC FL2 (Signed)
[place] LEVEL OF CARE SCREENING TOOL     IDENTIFICATION  Patient Name: [name]. Birthdate: 09/25/1950 Sex: male Admission Date (Current Location): 01/15/2022  [facility] and [place] Number:  Herbalist and Address:  [facility], [place] [address], [place], [place] [address]      Provider Number: [id number]  Attending Physician Name and Address:  Shelly Coss, MD  Relative Name and Phone Number:       Current Level of Care: Hospital Recommended Level of Care: [place] Prior Approval Number:    Date Approved/Denied:   PASRR Number: [id number]  Discharge Plan: SNF    Current Diagnoses: Patient Active Problem List   Diagnosis Date Noted   Acute encephalopathy 01/15/2022   Pressure injury of skin 01/04/2022   Somnolence 12/31/2021   Volume overload 12/30/2021   Cholecystitis    Drug rash    Acute pain of left thigh/rhabdomyolysis/myonecrosis 12/23/2021   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   GI bleed 12/23/2021   Scrotal edema 12/23/2021   SVT (supraventricular tachycardia) ([place]) 12/22/2021   Left leg weakness and numbness 12/21/2021   Calculus of bile duct with acute cholecystitis 12/21/2021   Elevated troponin 12/21/2021   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 16/07/930   Thrombocytopenia ([place]) 12/20/2021   AKI (acute kidney injury) ([place]) 35/57/3220   Acute metabolic encephalopathy 25/42/7062   Elevated liver enzymes 12/20/2021   Hyperbilirubinemia 12/20/2021   Traumatic rhabdomyolysis ([place]) 12/20/2021   Hyperkalemia, hyponatremia and hypomagnesemia 12/20/2021   Hyperkalemia 12/20/2021   Hyponatremia 12/20/2021   Septic shock ([place]) 12/18/2021   Hypomagnesemia 12/16/2020   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    Gastric nodule    Duodenal erythema    Polyp of colon    Left leg swelling 12/14/2020   Eye trauma 02/29/2020   History of pancytopenia 02/29/2020    Portal venous hypertension ([place]) 02/29/2020   S/P AKA (above knee amputation) unilateral ([place]) 02/29/2020   Hepatic cirrhosis due to chronic hepatitis C infection ([place]) 02/29/2020   Goals of care, counseling/discussion 02/29/2020   Disorder of skeletal system 02/29/2020   Physical deconditioning 02/29/2020   HTN (hypertension) 09/14/2019   Hepatic encephalopathy ([place]) 04/07/2018   Osteoarthritis of left knee 02/26/2018   Chronic pain syndrome 11/23/2016   Hypothyroidism 09/25/2016   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   Hepatitis B core antibody positive 05/22/2016   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[place]) 04/14/2015   Cellulitis of right thigh 05/21/2012   Pancytopenia ([place]) 02/26/2012   Encounter for long-term (current) use of other medications 02/26/2012   Screening for prostate cancer 02/26/2012   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/2012   Closed fracture of carpal bone 10/12/2009   Dyslipidemia 09/12/2009   Phantom limb syndrome ([place]) 09/12/2009   Hypocalcemia 12/23/2007   ERECTILE DYSFUNCTION, 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   History of nephrolithiasis 12/23/2007   Anxiety 03/24/2007    Orientation RESPIRATION BLADDER Height & Weight     Self, Time, Situation, Place  O2 Continent Weight: 215 lb 2.7 oz (97.6 kg) Height:  6' (182.9 cm)  BEHAVIORAL SYMPTOMS/MOOD NEUROLOGICAL BOWEL NUTRITION STATUS      Continent Diet (please see discharge summary)  AMBULATORY STATUS COMMUNICATION OF NEEDS Skin   Limited Assist Verbally  (pressure injury pretibial distal left unstable, pressure injury toe,anterior, left, & sacrum midc unstageable & coccyx mid unstageable & anterior left deep tissue pressure injury)  Personal Care Assistance Level of Assistance  Bathing, Feeding, Dressing Bathing Assistance: Limited assistance Feeding assistance: Independent Dressing Assistance: Limited assistance     Functional Limitations Info  Sight, Hearing, Speech Sight Info:  Impaired Hearing Info: Adequate Speech Info: Adequate    SPECIAL CARE FACTORS FREQUENCY  PT (By licensed PT), OT (By licensed OT)     PT Frequency: 5x per week OT Frequency: 5x per week            Contractures Contractures Info: Not present    Additional Factors Info  Code Status, Allergies Code Status Info: FULL Allergies Info: Clindamycin,Unasyn           Current Medications (01/28/2022):  This is the current hospital active medication list Current Facility-Administered Medications  Medication Dose Route Frequency Provider Last Rate Last Admin   0.9 %  sodium chloride infusion (Manually program via Guardrails IV Fluids)   Intravenous Once Shelly Coss, MD 10 mL/hr at 01/27/22 1806 Infusion Verify at 01/27/22 1806   0.9 %  sodium chloride infusion (Manually program via Guardrails IV Fluids)   Intravenous Once Adhikari, Amrit, MD       0.9 %  sodium chloride infusion  250 mL Intravenous Continuous Corey Harold, NP 20 mL/hr at 01/27/22 2157 250 mL at 01/27/22 2157   ALPRAZolam (XANAX) tablet 0.5 mg  0.5 mg Oral BID PRN Shelly Coss, MD   0.5 mg at 01/28/22 0813   ALPRAZolam (XANAX) tablet 2 mg  2 mg Oral QHS Alma Friendly, MD   2 mg at 01/27/22 2154   ceFEPIme (MAXIPIME) 2 g in sodium chloride 0.9 % 100 mL IVPB  2 g Intravenous Q8H [place], Abby K, RPH 200 mL/hr at 01/28/22 0558 2 g at 01/28/22 0558   Chlorhexidine Gluconate Cloth 2 % PADS 6 each  6 each Topical Daily Julian Hy, DO   6 each at 01/28/22 0820   diltiazem (CARDIZEM CD) 24 hr capsule 360 mg  360 mg Oral Daily Alma Friendly, MD   360 mg at 01/28/22 0813   docusate sodium (COLACE) capsule 100 mg  100 mg Oral BID PRN Corey Harold, NP       fentaNYL (DURAGESIC) 25 MCG/HR 1 patch  1 patch Transdermal Q72H Alma Friendly, MD   1 patch at 01/27/22 0936   furosemide (LASIX) tablet 80 mg  80 mg Oral Daily Alma Friendly, MD   80 mg at 01/28/22 7829   insulin aspart (novoLOG)  injection 0-15 Units  0-15 Units Subcutaneous Q4H Corey Harold, NP   2 Units at 01/28/22 0832   lactulose (CHRONULAC) 10 GM/15ML solution 10 g  10 g Oral TID Shelly Coss, MD   10 g at 01/28/22 0814   levothyroxine (SYNTHROID) tablet 50 mcg  50 mcg Oral QAC breakfast Alma Friendly, MD   50 mcg at 01/28/22 0558   magnesium sulfate IVPB 4 g 100 mL  4 g Intravenous Once Shelly Coss, MD 50 mL/hr at 01/28/22 0949 4 g at 01/28/22 0949   metroNIDAZOLE (FLAGYL) IVPB 500 mg  500 mg Intravenous Q12H Julian Hy, DO 100 mL/hr at 01/28/22 0834 500 mg at 01/28/22 0834   Oral care mouth rinse  15 mL Mouth Rinse PRN Noemi Chapel P, DO   15 mL at 01/16/22 0210   oxyCODONE (Oxy IR/ROXICODONE) immediate release tablet 20 mg  20 mg Oral Q6H PRN Alma Friendly, MD   20 mg at 01/28/22 1056  pantoprazole (PROTONIX) injection 40 mg  40 mg Intravenous Q12H Corey Harold, NP   40 mg at 01/28/22 0820   phenol (CHLORASEPTIC) mouth spray 1 spray  1 spray Mouth/Throat PRN Noemi Chapel P, DO   1 spray at 01/15/22 2158   polyethylene glycol (MIRALAX / GLYCOLAX) packet 17 g  17 g Oral Daily PRN Corey Harold, NP       potassium chloride SA (KLOR-CON M) CR tablet 40 mEq  40 mEq Oral Q2H Adhikari, Amrit, MD   40 mEq at 01/28/22 1057   sodium chloride flush (NS) 0.9 % injection 10-40 mL  10-40 mL Intracatheter Q12H Noemi Chapel P, DO   20 mL at 01/27/22 2215   sodium chloride flush (NS) 0.9 % injection 10-40 mL  10-40 mL Intracatheter PRN Noemi Chapel P, DO   20 mL at 01/20/22 0957   vancomycin (VANCOCIN) IVPB 1000 mg/200 mL premix  1,000 mg Intravenous Q12H Tawnya Crook, RPH 200 mL/hr at 01/28/22 0829 1,000 mg at 01/28/22 2248     Discharge Medications: Please see discharge summary for a list of discharge medications.  Relevant Imaging Results:  Relevant Lab Results:   Additional Information SSN [id number]  Vinie Sill, LCSW

## 2022-01-28 NOTE — Progress Notes (Signed)
Pharmacy Antibiotic Note  [name] [name]. is a 71 y.o. male admitted on 01/15/2022 with sepsis lower extremity wounds.  He was recently hospitalized with septic shock due to cholecystitis requiring percutaneous biliary drain and was discharged on 6/8. Vascular surgery consulted for L toe infection. Plan for AKA once plt count above 50k.  Pharmacy has been consulted for cefepime and vancomycin dosing. WBC low at 1.8 pltc improved 15>22, afebrile. Scr stable 0.3-0.4.  Last Vancomycin peak 24, vancomycin trough 12 - calculated AUC 425 completed 6/29  Plan: Continue cefepime 2 g IV q8h + metronidazole Continue vancomycin 1000 mg IV q12h Continue to monitor renal function for necessary dose adjustments   Height: 6' (182.9 cm) Weight: 97.6 kg (215 lb 2.7 oz) IBW/kg (Calculated) : 77.6  Temp (24hrs), Avg:98.1 F (36.7 C), Min:97.6 F (36.4 C), Max:98.5 F (36.9 C)  Recent Labs  Lab 01/22/22 0430 01/22/22 0843 01/23/22 0536 01/24/22 0800 01/24/22 2300 01/25/22 0620 01/25/22 1000 01/26/22 0427 01/27/22 0410 01/28/22 0513  WBC 1.6*  --  2.3* 2.2*  --  2.1*  --  2.6* 2.3* 1.8*  CREATININE 0.35* 0.42* 0.34*  --   --   --   --   --   --  0.47*  VANCOTROUGH  --   --   --   --   --   --  12*  --   --   --   VANCOPEAK  --   --   --   --  24*  --   --   --   --   --      Estimated Creatinine Clearance: 101.1 mL/min (A) (by C-G formula based on SCr of 0.47 mg/dL (L)).    Allergies  Allergen Reactions   Clindamycin Dermatitis   Unasyn [Ampicillin-Sulbactam Sodium] Rash    Diffuse whole body rash 12/28/21 Tolerated cefepime 01/15/22   Antimicrobials this admission: Cefepime 6/19 >>  Metronidazole 6/19 >>  Vancomycin 6/19 >>  Microbiology results: 6/19 BCx: ngtd 6/19 UCx: ngF  6/20 gallbladder fluid: Abundant E.coli (R amp, I Unasyn); Abundant pseudomonas aeruginosa (pan-sensitive)    Bonnita Nasuti Pharm.D. CPP, BCPS Clinical Pharmacist [phone] 01/28/2022 12:26  PM    Please check AMION for all [place] phone numbers After 10:00 PM, call [place] [phone]

## 2022-01-29 DIAGNOSIS — G9340 Encephalopathy, unspecified: Secondary | ICD-10-CM | POA: Diagnosis not present

## 2022-01-29 LAB — BPAM PLATELET PHERESIS
Blood Product Expiration Date: 202307032359
Blood Product Expiration Date: 202307032359
ISSUE DATE / TIME: 202307021218
ISSUE DATE / TIME: 202307021619
Unit Type and Rh: 8400
Unit Type and Rh: 8400

## 2022-01-29 LAB — CBC
HCT: 23 % — ABNORMAL LOW (ref 39.0–52.0)
Hemoglobin: 7.7 g/dL — ABNORMAL LOW (ref 13.0–17.0)
MCH: 30.6 pg (ref 26.0–34.0)
MCHC: 33.5 g/dL (ref 30.0–36.0)
MCV: 91.3 fL (ref 80.0–100.0)
Platelets: 27 10*3/uL — CL (ref 150–400)
RBC: 2.52 MIL/uL — ABNORMAL LOW (ref 4.22–5.81)
RDW: 21.3 % — ABNORMAL HIGH (ref 11.5–15.5)
WBC: 2.4 10*3/uL — ABNORMAL LOW (ref 4.0–10.5)
nRBC: 0 % (ref 0.0–0.2)

## 2022-01-29 LAB — GLUCOSE, CAPILLARY
Glucose-Capillary: 114 mg/dL — ABNORMAL HIGH (ref 70–99)
Glucose-Capillary: 122 mg/dL — ABNORMAL HIGH (ref 70–99)
Glucose-Capillary: 136 mg/dL — ABNORMAL HIGH (ref 70–99)
Glucose-Capillary: 161 mg/dL — ABNORMAL HIGH (ref 70–99)
Glucose-Capillary: 176 mg/dL — ABNORMAL HIGH (ref 70–99)

## 2022-01-29 LAB — PREPARE PLATELET PHERESIS
Unit division: 0
Unit division: 0

## 2022-01-29 LAB — BASIC METABOLIC PANEL
Anion gap: 7 (ref 5–15)
BUN: 11 mg/dL (ref 8–23)
CO2: 34 mmol/L — ABNORMAL HIGH (ref 22–32)
Calcium: 8.4 mg/dL — ABNORMAL LOW (ref 8.9–10.3)
Chloride: 96 mmol/L — ABNORMAL LOW (ref 98–111)
Creatinine, Ser: 0.58 mg/dL — ABNORMAL LOW (ref 0.61–1.24)
GFR, Estimated: >60 mL/min (ref >60)
Glucose, Bld: 116 mg/dL — ABNORMAL HIGH (ref 70–99)
Potassium: 4.9 mmol/L (ref 3.5–5.1)
Sodium: 137 mmol/L (ref 135–145)

## 2022-01-29 LAB — TYPE AND SCREEN
ABO/RH(D): B NEG
Antibody Screen: POSITIVE
DAT, IgG: NEGATIVE
Unit division: 0
Unit division: 0

## 2022-01-29 LAB — BPAM RBC
Blood Product Expiration Date: 202307312359
Blood Product Expiration Date: 202308012359
ISSUE DATE / TIME: 202306291618
Unit Type and Rh: 9500
Unit Type and Rh: 9500

## 2022-01-29 LAB — MAGNESIUM: Magnesium: 1.6 mg/dL — ABNORMAL LOW (ref 1.7–2.4)

## 2022-01-29 MED ORDER — MAGNESIUM SULFATE 2 GM/50ML IV SOLN
2.0000 g | Freq: Once | INTRAVENOUS | Status: AC
  Administered 2022-01-29: 2 g via INTRAVENOUS
  Filled 2022-01-29: qty 50

## 2022-01-29 MED ORDER — SODIUM CHLORIDE 0.9% IV SOLUTION: Freq: Once | INTRAVENOUS | Status: DC

## 2022-01-29 NOTE — Progress Notes (Addendum)
PROGRESS NOTE  Derek Anon Siddall Jr.  JJH:[id number] DOB: [birth year] DOA: 01/15/2022 PCP: [name], FNP   Brief Narrative:  Patient is a 71 year old male with history of hepatitis C, liver cirrhosis, chronic oxycodone use who presented with confusion, low blood pressure, abdominal pain from [facility] health care.  He was recently discharged on 01/04/2022 after being admitted for septic shock due to acute cholecystitis requiring percutaneous biliary drain.  Patient was also found with confusion presentation.  On presentation he was hypotensive and required ICU admission, transferred to [facility] service on 6/21.  He was found to have left toe infection, multiple pressure ulcers concerning for sepsis.  Vascular surgery consulted.  Hospital course also remarkable for severe thrombocytopenia needing platelet transfusion.  Vascular surgery consulted as well as  hematology .  Vascular surgery planning for amputation versus revascularization of the left lower extremity after improvement in the platelet level.   Assessment & Plan:  Principal Problem:   Acute encephalopathy Active Problems:   Septic shock (HCC)  Septic shock secondary to left toe infection/multiple pressure ulcers: Hypotensive on presentation, suspected to have sepsis.  Started on broad-spectrum antibiotics.  Blood cultures have been negative so far.  MRI of the left lower extremity did not show any osteomyelitis.  Continue broad-spectrum antibiotics for now.  Left lower limb ischemia/peripheral artery disease: Vascular surgery planning for revascularization versus amputation pending improvement in the platelet level.  Pancytopenia/thrombocytopenia: Hematology was following.  Suspected to be from cirrhosis.  He has chronic  thrombo-cytopenia.  Platelet level was very low in the range of 10 K.  Low WBC.  Total of 13 units of platelets transfusion.  Platelet in the range of 27,000 today.  Low suspicion for TTP, DIC, HIT, ITP as per  hematology.  Monitor hemoglobin, no evidence of acute blood loss.  We will continue to monitor.Given another 2 units today, will continue transfusion until platelets reach to the level of 50,000.  We are trying  HLA matched platelets.  Acute metabolic encephalopathy: Likely secondary to sepsis/AKI/hyperammonemia.  He has history of hepatic encephalopathy.Lactulose dose decreased due to diarrhoea.  Currently alert, oriented.  New onset A-fib: Now in normal sinus rhythm, spontaneously converted after started on Cardizem drip.  Cardiology was following, currently on Cardizem 360 mg daily.  Not a candidate for anticoagulant due to thrombocytopenia.  Monitor on telemetry.  History of hepatitis C/cirrhosis: On Lasix 80 mg daily.  Has history of hepatic encephalopathy. Has severe scrotal edema.  Continue lactulose.  Hypothyroidism: On Synthyroid  AKI: Resolved.   Chronic pain syndrome/anxiety: On Opiods,xanax at baseline  Hypokalemia/hypomagnesemia: Supplement and monitor as needed  Goals of care: Poor prognosis due to multiple comorbidities.  Palliative care was consulted.  Remains full code. I again discussed goals of care with the son on phone on 7/2.  His platelet level have not shown improvement despite being transfused consistently.  If he remains thrombocytopenic, he cannot go for surgery, in that case hospice would be the best approach for him. If his platelets level does not improve significantly by 7/4, I will reconsult palliative care, I will recommend hospice approach         Pressure Injury 01/03/22 Toe (Comment  which one) Anterior;Left Unstageable - Full thickness tissue loss in which the base of the injury is covered by slough (yellow, tan, gray, green or brown) and/or eschar (tan, brown or black) in the wound bed. (Active)  01/03/22 1000  Location: Toe (Comment  which one)  Location Orientation: Anterior;Left  Staging: Unstageable - Full thickness tissue loss in which the base  of the injury is covered by slough (yellow, tan, gray, green or brown) and/or eschar (tan, brown or black) in the wound bed.  Wound Description (Comments):   Present on Admission:   Dressing Type Gauze (Comment) 01/28/22 1530     Pressure Injury 01/03/22 Sacrum Mid Unstageable - Full thickness tissue loss in which the base of the injury is covered by slough (yellow, tan, gray, green or brown) and/or eschar (tan, brown or black) in the wound bed. (Active)  01/03/22 1000  Location: Sacrum  Location Orientation: Mid  Staging: Unstageable - Full thickness tissue loss in which the base of the injury is covered by slough (yellow, tan, gray, green or brown) and/or eschar (tan, brown or black) in the wound bed.  Wound Description (Comments):   Present on Admission:   Dressing Type Foam - Lift dressing to assess site every shift 01/28/22 0820     Pressure Injury 01/03/22 Coccyx Mid Unstageable - Full thickness tissue loss in which the base of the injury is covered by slough (yellow, tan, gray, green or brown) and/or eschar (tan, brown or black) in the wound bed. (Active)  01/03/22 1000  Location: Coccyx  Location Orientation: Mid  Staging: Unstageable - Full thickness tissue loss in which the base of the injury is covered by slough (yellow, tan, gray, green or brown) and/or eschar (tan, brown or black) in the wound bed.  Wound Description (Comments):   Present on Admission:   Dressing Type Foam - Lift dressing to assess site every shift 01/28/22 0820     Pressure Injury 01/03/22 Ankle Anterior;Left Deep Tissue Pressure Injury - Purple or maroon localized area of discolored intact skin or blood-filled blister due to damage of underlying soft tissue from pressure and/or shear. (Active)  01/03/22 1000  Location: Ankle  Location Orientation: Anterior;Left  Staging: Deep Tissue Pressure Injury - Purple or maroon localized area of discolored intact skin or blood-filled blister due to damage of underlying  soft tissue from pressure and/or shear.  Wound Description (Comments):   Present on Admission:   Dressing Type Gauze (Comment) 01/28/22 1530     Pressure Injury 01/15/22 Pretibial Distal;Left Unstageable - Full thickness tissue loss in which the base of the injury is covered by slough (yellow, tan, gray, green or brown) and/or eschar (tan, brown or black) in the wound bed. unstageable wound with  (Active)  01/15/22 2010  Location: Pretibial  Location Orientation: Distal;Left  Staging: Unstageable - Full thickness tissue loss in which the base of the injury is covered by slough (yellow, tan, gray, green or brown) and/or eschar (tan, brown or black) in the wound bed.  Wound Description (Comments): unstageable wound with drainage  Present on Admission: Yes  Dressing Type Gauze (Comment) 01/28/22 2100    DVT prophylaxis:SCDs Start: 01/15/22 1859     Code Status: Full Code  Family Communication: Discussed with son on phone on 7/2  Patient status:Inpatient  Patient is from :SNF  Anticipated discharge to:SNF  Estimated DC date:Not sure   Consultants: Vascular surgery, oncology  Procedures: None yet  Antimicrobials:  Anti-infectives (From admission, onward)    Start     Dose/Rate Route Frequency Ordered Stop   01/16/22 1400  vancomycin (VANCOCIN) IVPB 1000 mg/200 mL premix  Status:  Discontinued        1,000 mg 200 mL/hr over 60 Minutes Intravenous Every 24 hours 01/15/22 1923 01/16/22 0747   01/16/22 1100  metroNIDAZOLE (FLAGYL) IVPB 500 mg        500 mg 100 mL/hr over 60 Minutes Intravenous Every 12 hours 01/16/22 1004     01/16/22 1000  vancomycin (VANCOCIN) IVPB 1000 mg/200 mL premix        1,000 mg 200 mL/hr over 60 Minutes Intravenous Every 12 hours 01/16/22 0747     01/16/22 0845  ceFEPIme (MAXIPIME) 2 g in sodium chloride 0.9 % 100 mL IVPB        2 g 200 mL/hr over 30 Minutes Intravenous Every 8 hours 01/16/22 0757     01/15/22 2200  ceFEPIme (MAXIPIME) 2 g in  sodium chloride 0.9 % 100 mL IVPB  Status:  Discontinued        2 g 200 mL/hr over 30 Minutes Intravenous Every 12 hours 01/15/22 1923 01/16/22 0757   01/15/22 1430  vancomycin (VANCOREADY) IVPB 2000 mg/400 mL        2,000 mg 200 mL/hr over 120 Minutes Intravenous  Once 01/15/22 1425 01/15/22 1746   01/15/22 1415  ceFEPIme (MAXIPIME) 2 g in sodium chloride 0.9 % 100 mL IVPB        2 g 200 mL/hr over 30 Minutes Intravenous  Once 01/15/22 1412 01/15/22 1528   01/15/22 1415  metroNIDAZOLE (FLAGYL) IVPB 500 mg        500 mg 100 mL/hr over 60 Minutes Intravenous  Once 01/15/22 1412 01/15/22 1529   01/15/22 1415  vancomycin (VANCOCIN) IVPB 1000 mg/200 mL premix  Status:  Discontinued        1,000 mg 200 mL/hr over 60 Minutes Intravenous  Once 01/15/22 1412 01/15/22 1420       Subjective: Patient seen and examined at the bedside this morning.  He appears comfortable today.  There is no new complaints.  He has been transfused with platelets.  I again discussed about his complex situation regarding no improvement on platelets level despite being on  transfusion.  objective: Vitals:   01/28/22 1956 01/28/22 2320 01/29/22 0404 01/29/22 0810  BP: 131/63 (!) 131/50 119/66 (!) 133/56  Pulse: 78 69 68 86  Resp: _0 Temp: 98 F (36.7 C) 98.6 F (37 C) 98.1 F (36.7 C) 98.1 F (36.7 C)  TempSrc: Oral Oral Axillary Oral  SpO2: 99% 98% 95% 92%  Weight:      Height:        Intake/Output Summary (Last 24 hours) at 01/29/2022 1112 Last data filed at 01/29/2022 0404 Gross per 24 hour  Intake 1774.67 ml  Output 1950 ml  Net -175.33 ml   Filed Weights   01/23/22 0500 01/23/22 2128 01/24/22 0415  Weight: 97 kg 96.8 kg 97.6 kg    Examination:   General exam: Very deconditioned, chronically ill looking, weak, lying in bed, obese  HEENT: PERRL Respiratory system:  no wheezes or crackles  Cardiovascular system: S1 & S2 heard, RRR.  Gastrointestinal system: Abdomen is nondistended,  soft and nontender.  Right upper quadrant drain Central nervous system: Alert and oriented Extremities: No edema, no clubbing ,no cyanosis, right AKA, left lower extremity ulcers, PICC line on the right arm Skin: No rashes, no ulcers,no icterus   GU: Scrotal edema  Data Reviewed: I have personally reviewed following labs and imaging studies  CBC: Recent Labs  Lab 01/23/22 0536 01/24/22 0800 01/25/22 0620 01/26/22 0427 01/27/22 0410 01/28/22 0513 01/29/22 0420  WBC 2.3* 2.2* 2.1* 2.6* 2.3* 1.8* 2.4*  NEUTROABS 0.9* 0.8*  --   --   --   --   --  HGB 8.2* 7.4* 7.1* 8.1* 7.8* 7.3* 7.7*  HCT 25.2* 22.1* 22.0* 25.0* 22.4* 22.2* 23.0*  MCV 93.3 92.1 94.0 92.9 92.9 94.1 91.3  PLT 12* 10* 17* 15* 18* 22* 27*   Basic Metabolic Panel: Recent Labs  Lab 01/23/22 0536 01/28/22 0513 01/28/22 1500 01/29/22 0420  NA 136 140  --  137  K 3.7 2.6* 2.9* 4.9  CL 104 95*  --  96*  CO2 26 34*  --  34*  GLUCOSE 126* 156*  --  116*  BUN 14 8  --  11  CREATININE 0.34* 0.47*  --  0.58*  CALCIUM 8.4* 8.4*  --  8.4*  MG  --  1.2*  --  1.6*     No results found for this or any previous visit (from the past 240 hour(s)).    Radiology Studies: No results found.  Scheduled Meds:  sodium chloride   Intravenous Once   ALPRAZolam  2 mg Oral QHS   Chlorhexidine Gluconate Cloth  6 each Topical Daily   diltiazem  360 mg Oral Daily   furosemide  80 mg Oral Daily   insulin aspart  0-15 Units Subcutaneous Q4H   lactulose  10 g Oral TID   levothyroxine  50 mcg Oral QAC breakfast   pantoprazole (PROTONIX) IV  40 mg Intravenous Q12H   sodium chloride flush  10-40 mL Intracatheter Q12H   spironolactone  25 mg Oral Daily   Continuous Infusions:  sodium chloride 250 mL (01/27/22 2157)   ceFEPime (MAXIPIME) IV 2 g (01/29/22 4235)   metronidazole 500 mg (01/28/22 2344)   vancomycin 1,000 mg (01/29/22 0107)     LOS: 14 days   Shelly Coss, MD [facility] P7/09/2021, 11:12 AM

## 2022-01-29 NOTE — Progress Notes (Signed)
Physical Therapy Treatment Patient Details Name: [name]. MRN: [id number] DOB: 1950/12/11 Today's Date: 01/29/2022   History of Present Illness 71 year-old M with PMH of EtOH cirrhosis, nonimmune hemolytic anemia, pancytopenia, right AKA and hepatitis C brought to ED 12/18/21 with altered mental status after he slid off his bed and found down on the ground.. Patient  admitted to ICU with hypovolemic shock, hemolytic anemia, liver failure, encephalopathy, AKI and hyperkalemia.  Plan is for left LE amputation vs. Hospice and comfort care.    PT Comments    Pt admitted with above diagnosis. Pt was able to perform UE exercises with theraband.  Son asked PT to HOLD on sitting EOB until decision is made for amputation of LLE vs Hospice with comfort care. Will check back later in week. Pt encouraged to work on UEs exercises while in bed.   Pt currently with functional limitations due to balance and endurance deficits. Pt will benefit from skilled PT to increase their independence and safety with mobility to allow discharge to the venue listed below.      Recommendations for follow up therapy are one component of a multi-disciplinary discharge planning process, led by the attending physician.  Recommendations may be updated based on patient status, additional functional criteria and insurance authorization.  Follow Up Recommendations  Skilled nursing-short term rehab (<3 hours/day) Can patient physically be transported by private vehicle: No   Assistance Recommended at Discharge Frequent or constant Supervision/Assistance  Patient can return home with the following Two people to help with walking and/or transfers;A lot of help with bathing/dressing/bathroom;Assistance with cooking/housework;Direct supervision/assist for medications management;Direct supervision/assist for financial management;Assist for transportation;Help with stairs or ramp for entrance   Equipment Recommendations  None  recommended by PT    Recommendations for Other Services       Precautions / Restrictions Precautions Precautions: Fall Precaution Comments: R AKA, profound  weakness  left  hip, knee, demonstrates no ankle dorsi/plantar flex,  lacks P/AROM Knee extension appearing contracted;  per patient, contracture PTA(WC dep)also Restrictions Weight Bearing Restrictions: No RLE Weight Bearing: Non weight bearing LLE Weight Bearing: Non weight bearing Other Position/Activity Restrictions: unable to stand on L leg     Mobility  Bed Mobility Overal bed mobility: Needs Assistance Bed Mobility: Rolling Rolling: Max assist, +2 for physical assistance              Transfers                   General transfer comment: Pt sleeping in am and discussed therapy with pts son. He suggested for PT to come back later in day and to work with pts UEs until they decide about amputation.    Ambulation/Gait               General Gait Details: Pt unable to ambulate prior to hospital admission. stating he was using motorized WC   Stairs             Wheelchair Mobility    Modified Rankin (Stroke Patients Only)       Balance               Standing balance comment: unable                            Cognition Arousal/Alertness: Awake/alert Behavior During Therapy: WFL for tasks assessed/performed Overall Cognitive Status: Within Functional Limits for tasks assessed  General Comments: Pt tangental in conversation        Exercises General Exercises - Upper Extremity Shoulder Flexion: Strengthening, Both, 10 reps, Supine, Theraband Theraband Level (Shoulder Flexion): Level 2 (Red) Shoulder Extension: Strengthening, Both, 10 reps, Supine, Theraband Theraband Level (Shoulder Extension): Level 2 (Red) Shoulder Horizontal ADduction: AROM, Both, 10 reps, Supine, Theraband Theraband Level (Shoulder Horizontal  Adduction): Level 2 (Red) Elbow Flexion: Strengthening, Both, 10 reps, AROM, Theraband, Supine Theraband Level (Elbow Flexion): Level 2 (Red) Elbow Extension: Strengthening, Both, 10 reps, AROM, Supine, Theraband Theraband Level (Elbow Extension): Level 2 (Red)    General Comments        Pertinent Vitals/Pain Pain Assessment Pain Assessment: Faces Faces Pain Scale: Hurts little more Breathing: normal Negative Vocalization: none Facial Expression: smiling or inexpressive Body Language: tense, distressed pacing, fidgeting Consolability: no need to console PAINAD Score: 1 Pain Location: "everywhere" Pain Descriptors / Indicators: Sore Pain Intervention(s): Limited activity within patient's tolerance, Monitored during session, Repositioned    Home Living                          Prior Function            PT Goals (current goals can now be found in the care plan section) Acute Rehab PT Goals Patient Stated Goal: to get to bathroom Progress towards PT goals: Progressing toward goals    Frequency    Min 2X/week      PT Plan Current plan remains appropriate    Co-evaluation              AM-PAC PT "6 Clicks" Mobility   Outcome Measure  Help needed turning from your back to your side while in a flat bed without using bedrails?: Total Help needed moving from lying on your back to sitting on the side of a flat bed without using bedrails?: Total Help needed moving to and from a bed to a chair (including a wheelchair)?: Total Help needed standing up from a chair using your arms (e.g., wheelchair or bedside chair)?: Total Help needed to walk in hospital room?: Total Help needed climbing 3-5 steps with a railing? : Total 6 Click Score: 6    End of Session   Activity Tolerance: Patient limited by fatigue Patient left: in bed;with call bell/phone within reach Nurse Communication: Mobility status PT Visit Diagnosis: Adult, failure to thrive (R62.7);History  of falling (Z91.81);Muscle weakness (generalized) (M62.81)     Time: 6045-4098 PT Time Calculation (min) (ACUTE ONLY): 10 min  Charges:  $Therapeutic Exercise: 8-22 mins                     [facility] M,PT Acute Rehab Services [phone]    Alvira Philips 01/29/2022, 3:34 PM

## 2022-01-30 DIAGNOSIS — I70222 Atherosclerosis of native arteries of extremities with rest pain, left leg: Secondary | ICD-10-CM

## 2022-01-30 DIAGNOSIS — Z66 Do not resuscitate: Secondary | ICD-10-CM

## 2022-01-30 DIAGNOSIS — Z515 Encounter for palliative care: Secondary | ICD-10-CM

## 2022-01-30 DIAGNOSIS — G9340 Encephalopathy, unspecified: Secondary | ICD-10-CM | POA: Diagnosis not present

## 2022-01-30 LAB — GLUCOSE, CAPILLARY
Glucose-Capillary: 118 mg/dL — ABNORMAL HIGH (ref 70–99)
Glucose-Capillary: 121 mg/dL — ABNORMAL HIGH (ref 70–99)
Glucose-Capillary: 123 mg/dL — ABNORMAL HIGH (ref 70–99)
Glucose-Capillary: 128 mg/dL — ABNORMAL HIGH (ref 70–99)
Glucose-Capillary: 134 mg/dL — ABNORMAL HIGH (ref 70–99)
Glucose-Capillary: 168 mg/dL — ABNORMAL HIGH (ref 70–99)

## 2022-01-30 LAB — PREPARE PLATELET PHERESIS
Unit division: 0
Unit division: 0
Unit division: 0
Unit division: 0

## 2022-01-30 LAB — BPAM PLATELET PHERESIS
Blood Product Expiration Date: 202307052359
Blood Product Expiration Date: 202307052359
Blood Product Expiration Date: 202307052359
Blood Product Expiration Date: 202307062359
ISSUE DATE / TIME: 202307030836
ISSUE DATE / TIME: 202307031133
ISSUE DATE / TIME: 202307031529
Unit Type and Rh: 5100
Unit Type and Rh: 6200
Unit Type and Rh: 6200
Unit Type and Rh: 6200

## 2022-01-30 LAB — BASIC METABOLIC PANEL
Anion gap: 13 (ref 5–15)
BUN: 11 mg/dL (ref 8–23)
CO2: 35 mmol/L — ABNORMAL HIGH (ref 22–32)
Calcium: 8.6 mg/dL — ABNORMAL LOW (ref 8.9–10.3)
Chloride: 91 mmol/L — ABNORMAL LOW (ref 98–111)
Creatinine, Ser: 0.5 mg/dL — ABNORMAL LOW (ref 0.61–1.24)
GFR, Estimated: >60 mL/min (ref >60)
Glucose, Bld: 117 mg/dL — ABNORMAL HIGH (ref 70–99)
Potassium: 3.1 mmol/L — ABNORMAL LOW (ref 3.5–5.1)
Sodium: 139 mmol/L (ref 135–145)

## 2022-01-30 LAB — CBC
HCT: 21.6 % — ABNORMAL LOW (ref 39.0–52.0)
Hemoglobin: 7.2 g/dL — ABNORMAL LOW (ref 13.0–17.0)
MCH: 30.4 pg (ref 26.0–34.0)
MCHC: 33.3 g/dL (ref 30.0–36.0)
MCV: 91.1 fL (ref 80.0–100.0)
Platelets: 26 10*3/uL — CL (ref 150–400)
RBC: 2.37 MIL/uL — ABNORMAL LOW (ref 4.22–5.81)
RDW: 21.4 % — ABNORMAL HIGH (ref 11.5–15.5)
WBC: 1.8 10*3/uL — ABNORMAL LOW (ref 4.0–10.5)
nRBC: 0 % (ref 0.0–0.2)

## 2022-01-30 LAB — RED CROSS HLA ABC TYPING

## 2022-01-30 LAB — MAGNESIUM: Magnesium: 1.7 mg/dL (ref 1.7–2.4)

## 2022-01-30 MED ORDER — HYDROMORPHONE HCL 1 MG/ML IJ SOLN
0.5000 mg | INTRAMUSCULAR | Status: DC | PRN
  Administered 2022-01-30 – 2022-01-31 (×2): 0.5 mg via INTRAVENOUS
  Filled 2022-01-30 (×2): qty 0.5

## 2022-01-30 MED ORDER — ALPRAZOLAM 0.5 MG PO TABS
0.5000 mg | ORAL_TABLET | ORAL | Status: DC | PRN
  Administered 2022-01-30 – 2022-01-31 (×2): 0.5 mg via ORAL
  Filled 2022-01-30 (×2): qty 1

## 2022-01-30 MED ORDER — POTASSIUM CHLORIDE CRYS ER 20 MEQ PO TBCR
40.0000 meq | EXTENDED_RELEASE_TABLET | ORAL | Status: AC
  Administered 2022-01-30 (×2): 40 meq via ORAL
  Filled 2022-01-30 (×2): qty 2

## 2022-01-30 MED ORDER — OXYCODONE HCL 5 MG PO TABS
20.0000 mg | ORAL_TABLET | ORAL | Status: DC | PRN
  Administered 2022-01-30 – 2022-01-31 (×2): 20 mg via ORAL
  Filled 2022-01-30 (×2): qty 4

## 2022-01-30 NOTE — Progress Notes (Signed)
PROGRESS NOTE  Derek Anon Lorenson Jr.  UKG:[id number] DOB: 1951/07/24 DOA: 01/15/2022 PCP: [name], FNP   Brief Narrative:  Patient is a 71 year old male with history of hepatitis C, liver cirrhosis, chronic oxycodone use who presented with confusion, low blood pressure, abdominal pain from [facility] health care.  He was recently discharged on 01/04/2022 after being admitted for septic shock due to acute cholecystitis requiring percutaneous biliary drain.  Patient was also found with confusion presentation.  On presentation he was hypotensive and required ICU admission, transferred to [facility] service on 6/21.  He was found to have left toe infection, multiple pressure ulcers concerning for sepsis.  Vascular surgery consulted.  Hospital course also remarkable for severe thrombocytopenia needing platelet transfusion.  Vascular surgery consulted as well as  hematology .  Vascular surgery were planning for amputation versus revascularization of the left lower extremity after improvement in the platelet level. Looks like platelets level will not improve.  He already got 13 units of platelet transfusion without improvement.  I discussed with son at the bedside about goals of care today, I highly recommend hospice approach.  Palliative care reconsulted   Assessment & Plan:  Principal Problem:   Acute encephalopathy Active Problems:   Septic shock (HCC)  Septic shock secondary to left toe infection/multiple pressure ulcers: Hypotensive on presentation, suspected to have sepsis.  Started on broad-spectrum antibiotics.  Blood cultures have been negative so far.  MRI of the left lower extremity did not show any osteomyelitis.  Continue broad-spectrum antibiotics for now.  Left lower limb ischemia/peripheral artery disease: Vascular surgery were planning for revascularization versus amputation pending improvement in the platelet level.  We will follow-up with vascular surgery tomorrow regarding no  improvement in the platelets level and further plan  Pancytopenia/thrombocytopenia: Hematology was following.  Suspected to be from cirrhosis.  He has chronic  thrombo-cytopenia.  Platelet level was very low in the range of 10 K.  Low WBC.  Total of 13 units of platelets transfusion.  Platelet in the range of 27,000 today.  Low suspicion for TTP, DIC, HIT, ITP as per hematology.  Monitor hemoglobin, no evidence of acute blood loss.  Platelets level in the range of 27,000 today.    Acute metabolic encephalopathy: Likely secondary to sepsis/AKI/hyperammonemia.  He has history of hepatic encephalopathy.Lactulose dose decreased due to diarrhoea.  Currently alert, oriented.  New onset A-fib: Now in normal sinus rhythm, spontaneously converted after started on Cardizem drip.  Cardiology was following, currently on Cardizem 360 mg daily.  Not a candidate for anticoagulant due to thrombocytopenia.  Monitor on telemetry.  History of hepatitis C/cirrhosis: On Lasix 80 mg daily.  Has history of hepatic encephalopathy. Has severe scrotal edema.  Continue lactulose.  Hypothyroidism: On Synthyroid  AKI: Resolved.   Chronic pain syndrome/anxiety: On Opiods,xanax at baseline  Hypokalemia/hypomagnesemia: Supplement and monitor as needed  Goals of care: Poor prognosis due to multiple comorbidities.  Palliative care was consulted.  Remains full code. I again discussed goals of care with the son at bedside.  His platelet level have not shown improvement despite being transfused consistently.  If he remains thrombocytopenic, he cannot go for surgery, in that case hospice would be the best approach for him.  Reconsulted palliative care        Pressure Injury 01/03/22 Toe (Comment  which one) Anterior;Left Unstageable - Full thickness tissue loss in which the base of the injury is covered by slough (yellow, tan, gray, green or brown) and/or eschar (tan,  brown or black) in the wound bed. (Active)  01/03/22 1000   Location: Toe (Comment  which one)  Location Orientation: Anterior;Left  Staging: Unstageable - Full thickness tissue loss in which the base of the injury is covered by slough (yellow, tan, gray, green or brown) and/or eschar (tan, brown or black) in the wound bed.  Wound Description (Comments):   Present on Admission:   Dressing Type Gauze (Comment) 01/28/22 1530     Pressure Injury 01/03/22 Sacrum Mid Unstageable - Full thickness tissue loss in which the base of the injury is covered by slough (yellow, tan, gray, green or brown) and/or eschar (tan, brown or black) in the wound bed. (Active)  01/03/22 1000  Location: Sacrum  Location Orientation: Mid  Staging: Unstageable - Full thickness tissue loss in which the base of the injury is covered by slough (yellow, tan, gray, green or brown) and/or eschar (tan, brown or black) in the wound bed.  Wound Description (Comments):   Present on Admission:   Dressing Type Foam - Lift dressing to assess site every shift 01/28/22 0820     Pressure Injury 01/03/22 Coccyx Mid Unstageable - Full thickness tissue loss in which the base of the injury is covered by slough (yellow, tan, gray, green or brown) and/or eschar (tan, brown or black) in the wound bed. (Active)  01/03/22 1000  Location: Coccyx  Location Orientation: Mid  Staging: Unstageable - Full thickness tissue loss in which the base of the injury is covered by slough (yellow, tan, gray, green or brown) and/or eschar (tan, brown or black) in the wound bed.  Wound Description (Comments):   Present on Admission:   Dressing Type Foam - Lift dressing to assess site every shift 01/28/22 0820     Pressure Injury 01/03/22 Ankle Anterior;Left Deep Tissue Pressure Injury - Purple or maroon localized area of discolored intact skin or blood-filled blister due to damage of underlying soft tissue from pressure and/or shear. (Active)  01/03/22 1000  Location: Ankle  Location Orientation: Anterior;Left   Staging: Deep Tissue Pressure Injury - Purple or maroon localized area of discolored intact skin or blood-filled blister due to damage of underlying soft tissue from pressure and/or shear.  Wound Description (Comments):   Present on Admission:   Dressing Type Gauze (Comment) 01/28/22 1530     Pressure Injury 01/15/22 Pretibial Distal;Left Unstageable - Full thickness tissue loss in which the base of the injury is covered by slough (yellow, tan, gray, green or brown) and/or eschar (tan, brown or black) in the wound bed. unstageable wound with  (Active)  01/15/22 2010  Location: Pretibial  Location Orientation: Distal;Left  Staging: Unstageable - Full thickness tissue loss in which the base of the injury is covered by slough (yellow, tan, gray, green or brown) and/or eschar (tan, brown or black) in the wound bed.  Wound Description (Comments): unstageable wound with drainage  Present on Admission: Yes  Dressing Type Gauze (Comment) 01/28/22 2100    DVT prophylaxis:SCDs Start: 01/15/22 1859     Code Status: Full Code  Family Communication: Discussed with son at bedside on 7/3  Patient status:Inpatient  Patient is from :SNF  Anticipated discharge to:SNF vs hospice  Estimated DC date:Not sure   Consultants: Vascular surgery, oncology  Procedures: None yet  Antimicrobials:  Anti-infectives (From admission, onward)    Start     Dose/Rate Route Frequency Ordered Stop   01/16/22 1400  vancomycin (VANCOCIN) IVPB 1000 mg/200 mL premix  Status:  Discontinued  1,000 mg 200 mL/hr over 60 Minutes Intravenous Every 24 hours 01/15/22 1923 01/16/22 0747   01/16/22 1100  metroNIDAZOLE (FLAGYL) IVPB 500 mg        500 mg 100 mL/hr over 60 Minutes Intravenous Every 12 hours 01/16/22 1004     01/16/22 1000  vancomycin (VANCOCIN) IVPB 1000 mg/200 mL premix        1,000 mg 200 mL/hr over 60 Minutes Intravenous Every 12 hours 01/16/22 0747     01/16/22 0845  ceFEPIme (MAXIPIME) 2 g in  sodium chloride 0.9 % 100 mL IVPB        2 g 200 mL/hr over 30 Minutes Intravenous Every 8 hours 01/16/22 0757     01/15/22 2200  ceFEPIme (MAXIPIME) 2 g in sodium chloride 0.9 % 100 mL IVPB  Status:  Discontinued        2 g 200 mL/hr over 30 Minutes Intravenous Every 12 hours 01/15/22 1923 01/16/22 0757   01/15/22 1430  vancomycin (VANCOREADY) IVPB 2000 mg/400 mL        2,000 mg 200 mL/hr over 120 Minutes Intravenous  Once 01/15/22 1425 01/15/22 1746   01/15/22 1415  ceFEPIme (MAXIPIME) 2 g in sodium chloride 0.9 % 100 mL IVPB        2 g 200 mL/hr over 30 Minutes Intravenous  Once 01/15/22 1412 01/15/22 1528   01/15/22 1415  metroNIDAZOLE (FLAGYL) IVPB 500 mg        500 mg 100 mL/hr over 60 Minutes Intravenous  Once 01/15/22 1412 01/15/22 1529   01/15/22 1415  vancomycin (VANCOCIN) IVPB 1000 mg/200 mL premix  Status:  Discontinued        1,000 mg 200 mL/hr over 60 Minutes Intravenous  Once 01/15/22 1412 01/15/22 1420       Subjective: Patient seen and examined at the bedside this morning.  Hemodynamically stable, lying in bed, very weak, bedbound.  Appears sleepy, drowsy.  Long discussion held at the bedside with son about goals of care.  He is receptive and is looking forward to talk to palliative care team.  objective: Vitals:   01/30/22 0343 01/30/22 0817 01/30/22 0900 01/30/22 1001  BP: (!) 112/57 (!) 128/53 (!) 116/57 (!) 127/57  Pulse: 92 98    Resp: 16 16    Temp: 98.6 F (37 C) 98.4 F (36.9 C)    TempSrc: Oral Oral    SpO2: 98% 98%    Weight:      Height:        Intake/Output Summary (Last 24 hours) at 01/30/2022 1126 Last data filed at 01/30/2022 0900 Gross per 24 hour  Intake 1045 ml  Output 3400 ml  Net -2355 ml   Filed Weights   01/23/22 0500 01/23/22 2128 01/24/22 0415  Weight: 97 kg 96.8 kg 97.6 kg    Examination:   General exam: Obese, very weak, chronically looking, deconditioned HEENT: PERRL Respiratory system:  no wheezes or crackles   Cardiovascular system: S1 & S2 heard, RRR.  Gastrointestinal system: Abdomen is nondistended, soft and nontender.  Right upper quadrant drain Central nervous system: Alert and oriented Extremities: No edema, no clubbing ,no cyanosis, right AKA, left lower extremity ulcers, PICC line on the right arm Skin: No rashes, no ulcers,no icterus  GU: Scrotal edema  Data Reviewed: I have personally reviewed following labs and imaging studies  CBC: Recent Labs  Lab 01/24/22 0800 01/25/22 0620 01/26/22 0427 01/27/22 0410 01/28/22 0513 01/29/22 0420 01/30/22 0411  WBC 2.2*   < >  2.6* 2.3* 1.8* 2.4* 1.8*  NEUTROABS 0.8*  --   --   --   --   --   --   HGB 7.4*   < > 8.1* 7.8* 7.3* 7.7* 7.2*  HCT 22.1*   < > 25.0* 22.4* 22.2* 23.0* 21.6*  MCV 92.1   < > 92.9 92.9 94.1 91.3 91.1  PLT 10*   < > 15* 18* 22* 27* 26*   < > = values in this interval not displayed.   Basic Metabolic Panel: Recent Labs  Lab 01/28/22 0513 01/28/22 1500 01/29/22 0420 01/30/22 0411  NA 140  --  137 139  K 2.6* 2.9* 4.9 3.1*  CL 95*  --  96* 91*  CO2 34*  --  34* 35*  GLUCOSE 156*  --  116* 117*  BUN 8  --  11 11  CREATININE 0.47*  --  0.58* 0.50*  CALCIUM 8.4*  --  8.4* 8.6*  MG 1.2*  --  1.6* 1.7     No results found for this or any previous visit (from the past 240 hour(s)).    Radiology Studies: No results found.  Scheduled Meds:  sodium chloride   Intravenous Once   ALPRAZolam  2 mg Oral QHS   Chlorhexidine Gluconate Cloth  6 each Topical Daily   diltiazem  360 mg Oral Daily   furosemide  80 mg Oral Daily   insulin aspart  0-15 Units Subcutaneous Q4H   lactulose  10 g Oral TID   levothyroxine  50 mcg Oral QAC breakfast   pantoprazole (PROTONIX) IV  40 mg Intravenous Q12H   sodium chloride flush  10-40 mL Intracatheter Q12H   spironolactone  25 mg Oral Daily   Continuous Infusions:  sodium chloride 250 mL (01/27/22 2157)   ceFEPime (MAXIPIME) IV 2 g (01/30/22 0519)   metronidazole 500 mg  (01/30/22 0857)   vancomycin 1,000 mg (01/30/22 1019)     LOS: 15 days   Shelly Coss, MD [facility] P7/10/2021, 11:26 AM

## 2022-01-30 NOTE — Plan of Care (Signed)
  Problem: Education: Goal: Knowledge of General Education information will improve Description: Including pain rating scale, medication(s)/side effects and non-pharmacologic comfort measures Outcome: Progressing   Problem: Coping: Goal: Level of anxiety will decrease Outcome: Progressing   Problem: Pain Managment: Goal: General experience of comfort will improve Outcome: Progressing   

## 2022-01-30 NOTE — Progress Notes (Signed)
Palliative:  HPI: 71 y.o. male  with past medical history of hepatitis C, cirrhosis, hepatic encephalopathy, phantom limb syndrome s/p R AKA, chronic oxycodone admitted on 01/15/2022 with confusion, hypotension, abd pain from [facility] related to septic shock due to L toe infection vs pressure ulcer infection??. Recent hospitalization 12/18/21-01/04/22 with septic shock, rhabdomyolysis vs necrotizing fasciitis, PVD/PAD with severe LLE circulation, SVT, hepatic encephalopathy with decompensated liver failure, AKI, splenic infarction, and chronic pain. Palliative followed throughout last hospitalization assisting with goals of care (remains full code/full scope) as well as guidance with pain management. May be facing another amputation vs revascularization procedure but may not be stable for surgery and invasive management. Surgical intervention of any kind would be risky per vascular notes. Not adequate improvement in platelets despite numerous transfusions to enable surgical intervention.   I met today at [name] bedside with his son [name] after receiving update from Therapist, sports. We all had a long conversation regarding his progression and lack of improvement. [name] does have more confusion and appears much worse than when I saw him last. He is able to understand his poor prognosis. We discussed plans to focus on his comfort, liberalize medications for comfort, and minimize medications that are not adding to his comfort. I discussed with them code status and they agree that DNR is most appropriate at this time. I also discussed with them having help from hospice to continue to keep him comfortable - either hospice facility vs hospice to come to him in facility. They agree with hospice support. I attempted to ask [name] about his desires to continue lactulose and pills but answer not entirely clear so will continue for now and I educated [name] just to let the nurse know if he does not want to take these  medications.   I discussed with them plan to be surrounded by his loved ones. [name] will call pastor to come and visit. [name] is understandably very tearful and sad but also remains steadfast in his faith to gain comfort. We will meet again tomorrow to discuss hospice options based on progression overnight.   All questions/concerns addressed. Emotional support provided.   Exam: Alert, more confused. Tearful - appropriate to situation. No distress. Breathing regular, unlabored. Abd soft. Generalized weakness, fatigue, and pain.   Plan: - DNR decided.  - Open to hospice options.  - Liberalize pain, anxiety, comfort medications.  - Minimize medications/interventions not adding to comfort. - I will follow up tomorrow to further discuss appropriate hospice options.   Hunt, NP Palliative Medicine Team Pager [phone] (Please see [email or web address] for schedule) Team Phone [phone]    Greater than 50%  of this time was spent counseling and coordinating care related to the above assessment and plan

## 2022-01-31 DIAGNOSIS — G9340 Encephalopathy, unspecified: Secondary | ICD-10-CM | POA: Diagnosis not present

## 2022-01-31 DIAGNOSIS — R52 Pain, unspecified: Secondary | ICD-10-CM

## 2022-01-31 LAB — GLUCOSE, CAPILLARY
Glucose-Capillary: 128 mg/dL — ABNORMAL HIGH (ref 70–99)
Glucose-Capillary: 129 mg/dL — ABNORMAL HIGH (ref 70–99)
Glucose-Capillary: 132 mg/dL — ABNORMAL HIGH (ref 70–99)
Glucose-Capillary: 133 mg/dL — ABNORMAL HIGH (ref 70–99)
Glucose-Capillary: 149 mg/dL — ABNORMAL HIGH (ref 70–99)
Glucose-Capillary: 159 mg/dL — ABNORMAL HIGH (ref 70–99)

## 2022-01-31 MED ORDER — ALPRAZOLAM 0.5 MG PO TABS: 1.0000 mg | ORAL_TABLET | ORAL | Status: DC | PRN

## 2022-01-31 MED ORDER — OXYCODONE HCL 5 MG PO TABS
20.0000 mg | ORAL_TABLET | ORAL | Status: DC
Start: 2022-01-31 — End: 2022-01-31

## 2022-01-31 MED ORDER — OXYCODONE HCL 5 MG PO TABS
20.0000 mg | ORAL_TABLET | Freq: Four times a day (QID) | ORAL | Status: DC | PRN
  Administered 2022-01-31: 20 mg via ORAL
  Filled 2022-01-31: qty 4

## 2022-01-31 MED ORDER — MORPHINE SULFATE (CONCENTRATE) 10 MG/0.5ML PO SOLN
5.0000 mg | ORAL | Status: DC | PRN
  Administered 2022-01-31: 10 mg via SUBLINGUAL
  Filled 2022-01-31 (×2): qty 0.5

## 2022-01-31 MED ORDER — ALPRAZOLAM 0.5 MG PO TABS
1.0000 mg | ORAL_TABLET | ORAL | Status: DC
  Administered 2022-01-31: 1 mg via ORAL
  Administered 2022-01-31 – 2022-02-01 (×4): 2 mg via ORAL
  Filled 2022-01-31: qty 4
  Filled 2022-01-31: qty 2
  Filled 2022-01-31 (×3): qty 4

## 2022-01-31 MED ORDER — MORPHINE SULFATE (CONCENTRATE) 10 MG/0.5ML PO SOLN
5.0000 mg | ORAL | Status: DC
  Administered 2022-01-31 (×2): 10 mg via ORAL
  Filled 2022-01-31: qty 0.5

## 2022-01-31 MED ORDER — OXYCODONE HCL 5 MG PO TABS
20.0000 mg | ORAL_TABLET | ORAL | Status: DC
  Administered 2022-01-31 – 2022-02-01 (×2): 20 mg via ORAL
  Filled 2022-01-31 (×2): qty 4

## 2022-01-31 NOTE — Progress Notes (Signed)
Palliative:  HPI: 71 y.o. male  with past medical history of hepatitis C, cirrhosis, hepatic encephalopathy, phantom limb syndrome s/p R AKA, chronic oxycodone admitted on 01/15/2022 with confusion, hypotension, abd pain from [facility] related to septic shock due to L toe infection vs pressure ulcer infection??. Recent hospitalization 12/18/21-01/04/22 with septic shock, rhabdomyolysis vs necrotizing fasciitis, PVD/PAD with severe LLE circulation, SVT, hepatic encephalopathy with decompensated liver failure, AKI, splenic infarction, and chronic pain. Palliative followed throughout last hospitalization assisting with goals of care (remains full code/full scope) as well as guidance with pain management. May be facing another amputation vs revascularization procedure but may not be stable for surgery and invasive management. Surgical intervention of any kind would be risky per vascular notes. Not adequate improvement in platelets despite numerous transfusions to enable surgical intervention.   I met today with Mr. Morais and son, [name], at bedside. Mr. Shehan has increasing confusion as well as worsening pain and anxiety. He is not really eating much anymore (anticipate this will only continue to decrease as comfort medications titrated to meet his needs). Although he is confused he still recalls his poor prognosis. More difficult to maintain him on topic and to get answers to my questions today. Discussed with them my recommendation for hospice facility placement to continue with comfort care. Mr. Agustin ultimately leaves this decision in Jack's hands. I discussed with them schedule for comfort medication. Will leave lactulose if he wishes to take as he is anticipating visit today from his pastor. Unlikely he will be able to continue po intake very much longer.   All questions/concerns addressed. Emotional support provided.   Exam: More lethargic - struggles to stay awake for conversation. More confused. Breathing  regular, unlabored. Abd soft. Still in significant pain - grimacing with any slight movement.   Plan: - Morphine SL 5-10 mg + Xanax 1-2 mg every 4 hours scheduled. PRN doses available for breakthrough symptoms. Anticipate he will need ongoing titration. May crush and provide SL with a few drops of water if unable to swallow.  - DNR, comfort care. - Transition to hospice facility recommended - they are interested in [facility]. Prognosis likely < 2 weeks.   Dora, NP Palliative Medicine Team Pager [phone] (Please see [email or web address] for schedule) Team Phone [phone]    Greater than 50%  of this time was spent counseling and coordinating care related to the above assessment and plan

## 2022-01-31 NOTE — Progress Notes (Signed)
PROGRESS NOTE  Derek Anon Mcculloh Jr.  GMW:[id number] DOB: [birth year] DOA: 01/15/2022 PCP: [name], FNP   Brief Narrative:  Patient is a 71 year old male with history of hepatitis C, liver cirrhosis, chronic oxycodone use who presented with confusion, low blood pressure, abdominal pain from [facility] [facility].  He was recently discharged on 01/04/2022 after being admitted for septic shock due to acute cholecystitis requiring percutaneous biliary drain.  Patient was also found with confusion presentation.  On presentation he was hypotensive and required ICU admission, transferred to [facility] service on 6/21.  He was found to have left toe infection, multiple pressure ulcers concerning for sepsis.  Vascular surgery consulted.  Hospital course also remarkable for severe thrombocytopenia needing platelet transfusion.  Vascular surgery consulted as well as  hematology .  Vascular surgery were planning for amputation versus revascularization of the left lower extremity after improvement in the platelet level. Platelets level never improved despite getting 13 units of platelet .  Palliative care following for goals of care, now on full comfort care.  Plan for residential hospice placement.   Assessment & Plan:  Principal Problem:   Acute encephalopathy Active Problems:   Septic shock (HCC)   Critical limb ischemia of left lower extremity (HCC)   DNR (do not resuscitate)   Palliative care by specialist  Septic shock secondary to left toe infection/multiple pressure ulcers: Hypotensive on presentation, suspected to have sepsis.  Started on broad-spectrum antibiotics.  Blood cultures have been negative so far.  MRI of the left lower extremity did not show any osteomyelitis.  On comfort care now  Left lower limb ischemia/peripheral artery disease: Vascular surgery were planning for revascularization versus amputation pending improvement in the platelet level.  Now on comfort  care  Pancytopenia/thrombocytopenia: Hematology was following.  Suspected to be from cirrhosis.  He has chronic  thrombo-cytopenia.  Platelet level was very low in the range of 10 K.  Low WBC.  Total of 13 units of platelets transfusion.   Low suspicion for TTP, DIC, HIT, ITP as per hematology.  Now on comfort care  Acute metabolic encephalopathy: Likely secondary to sepsis/AKI/hyperammonemia.  He has history of hepatic encephalopathy.Lactulose dose decreased due to diarrhoea.  Currently alert, oriented.  New onset A-fib: Now in normal sinus rhythm, spontaneously converted after started on Cardizem drip.  Cardiology was following. Not a candidate for anticoagulant due to thrombocytopenia.  On comfort care  History of hepatitis C/cirrhosis:He was  On Lasix 80 mg daily.  Has history of hepatic encephalopathy. Has severe scrotal edema.  On comfort care  AKI: Resolved.   Chronic pain syndrome/anxiety: On Opiods,xanax at baseline   Goals of care: Poor prognosis due to multiple comorbidities.  Palliative care was consulted.    His platelet level did not not show improvement despite being transfused consistently. On comfort care now        Pressure Injury 01/03/22 Toe (Comment  which one) Anterior;Left Unstageable - Full thickness tissue loss in which the base of the injury is covered by slough (yellow, tan, gray, green or brown) and/or eschar (tan, brown or black) in the wound bed. (Active)  01/03/22 1000  Location: Toe (Comment  which one)  Location Orientation: Anterior;Left  Staging: Unstageable - Full thickness tissue loss in which the base of the injury is covered by slough (yellow, tan, gray, green or brown) and/or eschar (tan, brown or black) in the wound bed.  Wound Description (Comments):   Present on Admission:   Dressing Type Other (  Comment) 01/30/22 2000     Pressure Injury 01/03/22 Sacrum Mid Unstageable - Full thickness tissue loss in which the base of the injury is covered by  slough (yellow, tan, gray, green or brown) and/or eschar (tan, brown or black) in the wound bed. (Active)  01/03/22 1000  Location: Sacrum  Location Orientation: Mid  Staging: Unstageable - Full thickness tissue loss in which the base of the injury is covered by slough (yellow, tan, gray, green or brown) and/or eschar (tan, brown or black) in the wound bed.  Wound Description (Comments):   Present on Admission:   Dressing Type Gauze (Comment) 01/30/22 2000     Pressure Injury 01/03/22 Coccyx Mid Unstageable - Full thickness tissue loss in which the base of the injury is covered by slough (yellow, tan, gray, green or brown) and/or eschar (tan, brown or black) in the wound bed. (Active)  01/03/22 1000  Location: Coccyx  Location Orientation: Mid  Staging: Unstageable - Full thickness tissue loss in which the base of the injury is covered by slough (yellow, tan, gray, green or brown) and/or eschar (tan, brown or black) in the wound bed.  Wound Description (Comments):   Present on Admission:   Dressing Type Foam - Lift dressing to assess site every shift 01/30/22 2000     Pressure Injury 01/03/22 Ankle Anterior;Left Deep Tissue Pressure Injury - Purple or maroon localized area of discolored intact skin or blood-filled blister due to damage of underlying soft tissue from pressure and/or shear. (Active)  01/03/22 1000  Location: Ankle  Location Orientation: Anterior;Left  Staging: Deep Tissue Pressure Injury - Purple or maroon localized area of discolored intact skin or blood-filled blister due to damage of underlying soft tissue from pressure and/or shear.  Wound Description (Comments):   Present on Admission:   Dressing Type Gauze (Comment) 01/30/22 2000     Pressure Injury 01/15/22 Pretibial Distal;Left Unstageable - Full thickness tissue loss in which the base of the injury is covered by slough (yellow, tan, gray, green or brown) and/or eschar (tan, brown or black) in the wound bed.  unstageable wound with  (Active)  01/15/22 2010  Location: Pretibial  Location Orientation: Distal;Left  Staging: Unstageable - Full thickness tissue loss in which the base of the injury is covered by slough (yellow, tan, gray, green or brown) and/or eschar (tan, brown or black) in the wound bed.  Wound Description (Comments): unstageable wound with drainage  Present on Admission: Yes  Dressing Type Other (Comment) 01/30/22 2000    DVT prophylaxis:SCDs Start: 01/15/22 1859     Code Status: DNR  Family Communication: Discussed with son at bedside on 7/5  Patient status:Inpatient  Patient is from :SNF  Anticipated discharge [facility] hospice  Estimated DC date: as soon as bed is available   Consultants: Vascular surgery, oncology  Procedures: None yet  Antimicrobials:  Anti-infectives (From admission, onward)    Start     Dose/Rate Route Frequency Ordered Stop   01/16/22 1400  vancomycin (VANCOCIN) IVPB 1000 mg/200 mL premix  Status:  Discontinued        1,000 mg 200 mL/hr over 60 Minutes Intravenous Every 24 hours 01/15/22 1923 01/16/22 0747   01/16/22 1100  metroNIDAZOLE (FLAGYL) IVPB 500 mg  Status:  Discontinued        500 mg 100 mL/hr over 60 Minutes Intravenous Every 12 hours 01/16/22 1004 01/30/22 1714   01/16/22 1000  vancomycin (VANCOCIN) IVPB 1000 mg/200 mL premix  Status:  Discontinued  1,000 mg 200 mL/hr over 60 Minutes Intravenous Every 12 hours 01/16/22 0747 01/30/22 1714   01/16/22 0845  ceFEPIme (MAXIPIME) 2 g in sodium chloride 0.9 % 100 mL IVPB  Status:  Discontinued        2 g 200 mL/hr over 30 Minutes Intravenous Every 8 hours 01/16/22 0757 01/30/22 1714   01/15/22 2200  ceFEPIme (MAXIPIME) 2 g in sodium chloride 0.9 % 100 mL IVPB  Status:  Discontinued        2 g 200 mL/hr over 30 Minutes Intravenous Every 12 hours 01/15/22 1923 01/16/22 0757   01/15/22 1430  vancomycin (VANCOREADY) IVPB 2000 mg/400 mL        2,000 mg 200 mL/hr over 120  Minutes Intravenous  Once 01/15/22 1425 01/15/22 1746   01/15/22 1415  ceFEPIme (MAXIPIME) 2 g in sodium chloride 0.9 % 100 mL IVPB        2 g 200 mL/hr over 30 Minutes Intravenous  Once 01/15/22 1412 01/15/22 1528   01/15/22 1415  metroNIDAZOLE (FLAGYL) IVPB 500 mg        500 mg 100 mL/hr over 60 Minutes Intravenous  Once 01/15/22 1412 01/15/22 1529   01/15/22 1415  vancomycin (VANCOCIN) IVPB 1000 mg/200 mL premix  Status:  Discontinued        1,000 mg 200 mL/hr over 60 Minutes Intravenous  Once 01/15/22 1412 01/15/22 1420       Subjective: Patient seen and examined the bedside this morning.  He has been transitioned to full comfort care.  Perative care provider talking with the family and patient.  Looks comfortable.  objective: Vitals:   01/30/22 1132 01/30/22 1420 01/30/22 1605 01/30/22 2008  BP: (!) 117/58 (!) 124/56 114/74 120/62  Pulse:   76 97  Resp:   10 19  Temp:   98.1 F (36.7 C) 98.2 F (36.8 C)  TempSrc:    Axillary  SpO2:   91% 92%  Weight:      Height:        Intake/Output Summary (Last 24 hours) at 01/31/2022 1125 Last data filed at 01/31/2022 0742 Gross per 24 hour  Intake 120 ml  Output 3651 ml  Net -3531 ml   Filed Weights   01/23/22 0500 01/23/22 2128 01/24/22 0415  Weight: 97 kg 96.8 kg 97.6 kg    Examination:   General exam: Not in distress, significantly deconditioned, chronically looking HEENT: PERRL Respiratory system:  no wheezes or crackles  Cardiovascular system: S1 & S2 heard, RRR.  Gastrointestinal system: Abdomen is nondistended, soft and nontender.  Right upper quadrant drain Central nervous system: Alert and oriented Extremities: Right AKA, ulcers on the left lower extremity  Data Reviewed: I have personally reviewed following labs and imaging studies  CBC: Recent Labs  Lab 01/26/22 0427 01/27/22 0410 01/28/22 0513 01/29/22 0420 01/30/22 0411  WBC 2.6* 2.3* 1.8* 2.4* 1.8*  HGB 8.1* 7.8* 7.3* 7.7* 7.2*  HCT 25.0* 22.4*  22.2* 23.0* 21.6*  MCV 92.9 92.9 94.1 91.3 91.1  PLT 15* 18* 22* 27* 26*   Basic Metabolic Panel: Recent Labs  Lab 01/28/22 0513 01/28/22 1500 01/29/22 0420 01/30/22 0411  NA 140  --  137 139  K 2.6* 2.9* 4.9 3.1*  CL 95*  --  96* 91*  CO2 34*  --  34* 35*  GLUCOSE 156*  --  116* 117*  BUN 8  --  11 11  CREATININE 0.47*  --  0.58* 0.50*  CALCIUM 8.4*  --  8.4* 8.6*  MG 1.2*  --  1.6* 1.7     No results found for this or any previous visit (from the past 240 hour(s)).    Radiology Studies: No results found.  Scheduled Meds:  sodium chloride   Intravenous Once   ALPRAZolam  1-2 mg Oral Q4H   Chlorhexidine Gluconate Cloth  6 each Topical Daily   diltiazem  360 mg Oral Daily   furosemide  80 mg Oral Daily   lactulose  10 g Oral TID   morphine CONCENTRATE  5-10 mg Oral Q4H   sodium chloride flush  10-40 mL Intracatheter Q12H   Continuous Infusions:  sodium chloride 250 mL (01/27/22 2157)     LOS: 16 days   Shelly Coss, MD [facility] P7/11/2021, 11:25 AM

## 2022-01-31 NOTE — Progress Notes (Signed)
Manufacturing engineer [facility]) Hospital Liaison Note  Referral received for patient/family interest in [facility]. Chart under review for eligibility.   Hospice eligibility confirmed.   Plan is to transfer to [facility].   Please call with any questions or concerns. Thank you  Roselee Nova, [place] Hospital Liaison [phone]

## 2022-01-31 NOTE — Progress Notes (Signed)
[facility]  Subjective  - No change   Objective 120/62 97 98.2 F (36.8 C) (Axillary) 19 92%  Intake/Output Summary (Last 24 hours) at 01/31/2022 0701 Last data filed at 01/31/2022 0009 Gross per 24 hour  Intake 800 ml  Output 4150 ml  Net -3350 ml    Left toe/foot ulcer unchanged   Assessment/Planning: Plan for left LE AKA when PLT > 50.  01/30/22 26  Labs not draw this am will order CBC to ck plt.    Roxy Horseman 01/31/2022 7:01 AM --  Laboratory Lab Results: Recent Labs    01/29/22 0420 01/30/22 0411  WBC 2.4* 1.8*  HGB 7.7* 7.2*  HCT 23.0* 21.6*  PLT 27* 26*   BMET Recent Labs    01/29/22 0420 01/30/22 0411  NA 137 139  K 4.9 3.1*  CL 96* 91*  CO2 34* 35*  GLUCOSE 116* 117*  BUN 11 11  CREATININE 0.58* 0.50*  CALCIUM 8.4* 8.6*    COAG Lab Results  Component Value Date   INR 1.4 (H) 01/16/2022   INR >10.0 (HH) 01/15/2022   INR 1.4 (H) 01/15/2022   No results found for: "PTT"

## 2022-02-01 DIAGNOSIS — G9340 Encephalopathy, unspecified: Secondary | ICD-10-CM | POA: Diagnosis not present

## 2022-02-01 LAB — CBC
HCT: 22.6 % — ABNORMAL LOW (ref 39.0–52.0)
Hemoglobin: 7.6 g/dL — ABNORMAL LOW (ref 13.0–17.0)
MCH: 30.9 pg (ref 26.0–34.0)
MCHC: 33.6 g/dL (ref 30.0–36.0)
MCV: 91.9 fL (ref 80.0–100.0)
Platelets: 18 10*3/uL — CL (ref 150–400)
RBC: 2.46 MIL/uL — ABNORMAL LOW (ref 4.22–5.81)
RDW: 21.9 % — ABNORMAL HIGH (ref 11.5–15.5)
WBC: 2.1 10*3/uL — ABNORMAL LOW (ref 4.0–10.5)
nRBC: 0 % (ref 0.0–0.2)

## 2022-02-01 LAB — GLUCOSE, CAPILLARY
Glucose-Capillary: 133 mg/dL — ABNORMAL HIGH (ref 70–99)
Glucose-Capillary: 154 mg/dL — ABNORMAL HIGH (ref 70–99)

## 2022-02-01 NOTE — Progress Notes (Signed)
   Patient seen by Palliative care and the Plan is as follows  Plan: - Morphine SL 5-10 mg + Xanax 1-2 mg every 4 hours scheduled. PRN doses available for breakthrough symptoms. Anticipate he will need ongoing titration. May crush and provide SL with a few drops of water if unable to swallow.  - DNR, comfort care. - Transition to hospice facility recommended - they are interested in [facility]. Prognosis likely < 2 weeks.   [facility] will sign off.  If things change and our services are needed please let us know.   Roxy Horseman [facility] [phone]

## 2022-02-01 NOTE — Progress Notes (Signed)
Manufacturing engineer [facility]) Hospital Liaison Note   Bed offered and accepted for transfer to [facility] today. Unit RN please call report to [phone] prior to patient leaving the unit. Please send signed DNR and paperwork with patient.    Please call with any questions or concerns. Thank you   Roselee Nova, [place] Hospital Liaison [phone]

## 2022-02-01 NOTE — TOC Transition Note (Signed)
Transition of Care [facility] - CM/SW Discharge Note   Patient Details  Name: [name]. MRN: [id number] Date of Birth: 16-Jun-1951  Transition of Care [facility]) CM/SW Contact:  Verdell Carmine, RN Phone Number: 02/01/2022, 9:51 AM   Clinical Narrative:     Spoke to [place] at [facility]. Bed available at [facility] place. RN updated. Can call report to [phone]. .   Final next level of care: [place] Barriers to Discharge: No Barriers Identified   Patient Goals and CMS Choice Patient states their goals for this hospitalization and ongoing recovery are:: Pt Oriented to self   Choice offered to / list presented to : Adult Children  Discharge Placement               [facility]        Discharge Plan and Services In-house Referral: Clinical Social Work   Post Acute Care Choice: Residential Hospice Bed                               Social Determinants of Health (SDOH) Interventions     Readmission Risk Interventions     No data to display

## 2022-02-01 NOTE — Discharge Summary (Signed)
Physician Discharge Summary  [name]. XAJ:[id number] DOB: [birth year] DOA: 01/15/2022  PCP: [name], FNP  Admit date: 01/15/2022 Discharge date: 02/01/2022  Admitted From: Home Disposition:  residential Hospice  Discharge Condition:Stable CODE STATUS: Comfort Care Diet recommendation: Regular   Brief/Interim Summary:  Patient is a 71 year old male with history of hepatitis C, liver cirrhosis, chronic oxycodone use who presented with confusion, low blood pressure, abdominal pain from [facility] health care.  He was recently discharged on 01/04/2022 after being admitted for septic shock due to acute cholecystitis requiring percutaneous biliary drain.  Patient was also found with confusion presentation.  On presentation he was hypotensive and required ICU admission, transferred to [facility] service on 6/21.  He was found to have left toe infection, multiple pressure ulcers concerning for sepsis.  Vascular surgery consulted.  Hospital course also remarkable for severe thrombocytopenia needing platelet transfusion.  Vascular surgery consulted as well as  hematology .  Vascular surgery were planning for amputation versus revascularization of the left lower extremity after improvement in the platelet level. Platelets level never improved despite getting 13 units of platelet .  Palliative care were following for goals of care.  After discussion with palliative care, patient's care has been transitioned to full comfort .  Plan for residential hospice placement.  Discharge Diagnoses:  Principal Problem:   Acute encephalopathy Active Problems:   Septic shock ([place])   Critical limb ischemia of left lower extremity (HCC)   DNR (do not resuscitate)   Palliative care by specialist    Discharge Instructions  Discharge Instructions     Diet general   Complete by: As directed    No wound care   Complete by: As directed       Allergies as of 02/01/2022       Reactions   Clindamycin  Dermatitis   Unasyn [ampicillin-sulbactam Sodium] Rash   Diffuse whole body rash 12/28/21 Tolerated cefepime 01/15/22        Medication List     STOP taking these medications    acetaminophen 325 MG tablet Commonly known as: TYLENOL   ALPRAZolam 1 MG tablet Commonly known as: XANAX   fentaNYL 25 MCG/HR Commonly known as: DURAGESIC   folic acid 1 MG tablet Commonly known as: FOLVITE   furosemide 80 MG tablet Commonly known as: Lasix   lactulose 10 GM/15ML solution Commonly known as: CHRONULAC   levothyroxine 50 MCG tablet Commonly known as: SYNTHROID   losartan 100 MG tablet Commonly known as: COZAAR   lubiprostone 24 MCG capsule Commonly known as: AMITIZA   multivitamin with minerals Tabs tablet   Narcan 4 MG/0.1ML Liqd nasal spray kit Generic drug: naloxone   Oxycodone HCl 10 MG Tabs   pantoprazole 40 MG tablet Commonly known as: Protonix   QUEtiapine 25 MG tablet Commonly known as: SEROQUEL   Santyl 250 UNIT/GM ointment Generic drug: collagenase   vitamin B-12 1000 MCG tablet Commonly known as: CYANOCOBALAMIN   Vitamin D3 10 MCG (400 UNIT) Caps        Allergies  Allergen Reactions   Clindamycin Dermatitis   Unasyn [Ampicillin-Sulbactam Sodium] Rash    Diffuse whole body rash 12/28/21 Tolerated cefepime 01/15/22    Consultations: Vascular surgery, palliative care   Procedures/Studies: DG Chest Port 1 View  Result Date: 01/19/2022 CLINICAL DATA:  Provided history: Encounter for shortness of breath. EXAM: PORTABLE CHEST 1 VIEW COMPARISON:  Prior chest radiographs 01/15/2022 and earlier. FINDINGS: Right-sided PICC now present with tip projecting at the  level of the superior cavoatrial junction. Heart size within normal limits. New opacities within the left upper lobe and medial right lung base. No evidence of pleural effusion or pneumothorax. No acute bony abnormality identified. IMPRESSION: Right-sided PICC present with tip projecting at the  level of the superior cavoatrial junction. New opacities within the left upper lobe and medial right lung base, which may reflect atelectasis and/or pneumonia. The left upper lobe opacity is favored to reflect pneumonia. Electronically Signed   By: Kellie Simmering D.O.   On: 01/19/2022 08:57   MR TIBIA FIBULA LEFT W WO CONTRAST  Result Date: 01/16/2022 CLINICAL DATA:  Tibia and fibula osteomyelitis suspected. Ulcer of the distal lateral lower leg. EXAM: MRI OF LOWER LEFT EXTREMITY WITHOUT AND WITH CONTRAST TECHNIQUE: Multiplanar, multisequence MR imaging of the left tibia and fibula was performed both before and after administration of intravenous contrast. The technologist reports the patient was only able to lie on the left side. Best images possible were performed. CONTRAST:  11m GADAVIST GADOBUTROL 1 MMOL/ML IV SOLN COMPARISON:  CT left foot 01/15/2022 FINDINGS: Bones/Joint/Cartilage There are some areas of mild heterogeneous marrow signal especially within the mid height of the fibular diaphysis where there is patchy decreased T1 increased T2 signal (axial series 21 images 28 through 40, coronal series 19, image 15). This may represent mild stress related marrow edema. No cortical erosion is seen to specifically indicate suspicion for acute osteomyelitis. Ligaments Grossly unremarkable. Muscles and Tendons There is moderate edema throughout the visualized distal left calf musculature greatest within the anterior and deep posterior compartments and also the soleus muscle of the superficial posterior compartment. Mild fluid tracks around the posterior tibial, flexor hallucis longus and flexor digitorum longus muscles. Mild feathery fatty infiltration within the minimally partially visualized distal medial head of the gastrocnemius muscle. Soft tissues Moderate diffuse subcutaneous fat swelling, greatest within the distal anterolateral calf. There is a broad ulceration within the distal lateral calf region  measuring up to approximately 6.5 cm in craniocaudal dimension (coronal series 19, image 15). This comes close to the distal lateral fibular bone however does not appear to contact the bone and the cortex remains intact. No abnormal fibula marrow signal. No rim enhancing abscess is seen. IMPRESSION: 1. There is a deep ulcer within the distal lateral left calf measuring up to approximately 6.5 cm in craniocaudal dimension and coming close to the distal lateral fibular diaphysis. Nevertheless, the fibular cortex remains intact. No evidence of acute osteomyelitis. 2. There is mild heterogeneity of the more proximal (at mid height) fibular diaphyseal marrow that may include mild nonspecific marrow edema, however no cortical erosion is seen to specifically indicate acute osteomyelitis in this region. 3. Moderate edema within the visualized musculature, greatest within the anterior and deep posterior muscle compartments. Electronically Signed   By: RYvonne KendallM.D.   On: 01/16/2022 20:57   MR FOOT LEFT W WO CONTRAST  Result Date: 01/16/2022 CLINICAL DATA:  Left fifth toe ulcer. Ulcer of distal lateral lower leg. Evaluate for acute osteomyelitis. History of right leg amputation. EXAM: MRI OF THE LEFT FOREFOOT WITHOUT AND WITH CONTRAST TECHNIQUE: Multiplanar, multisequence MR imaging of the left forefoot was performed both before and after administration of intravenous contrast. The technologist reports the patient had a lie on his left side and the best possible images were performed. CONTRAST:  955mGADAVIST GADOBUTROL 1 MMOL/ML IV SOLN COMPARISON:  CT left foot 01/15/2022; FINDINGS: Bones/Joint/Cartilage There is decreased T1 increased T2 signal within  the subcutaneous fat just dorsal lateral to the fifth metatarsophalangeal joint (axial series 7, image 18 and sagittal series 11 image 27). This comes close to the dorsal lateral aspect of the fifth metatarsophalangeal joint, however no cortical erosion or marrow  edema is seen within the nearby bones to indicate acute osteomyelitis. There is increased STIR signal within the distal phalanges of the second (sagittal series 11, image 17) and third (sagittal series 11, image 21) toes. No definite adjacent ulceration is seen, however recommend clinical correlation for clinical suspicion for infection in this region. The increased STIR signal within the phalanges may be artifactual towards the edge of the image series. Moderate cartilage thinning and subchondral degenerative cystic changes throughout the third through fifth tarsometatarsal joints, with more severe osteoarthritis of the first and second tarsometatarsal joints. There is mild marrow edema within the dorsal third metatarsal head and dorsal lateral second metatarsal head which may represent degenerative subchondral marrow edema (coronal series 9 images 8 through 10, sagittal series 11 images 16 through 20). Ligaments The Lisfranc ligament complex appears intact. The plantar plates appear intact. Muscles and Tendons There is moderate diffuse edema throughout the visualized dorsal and plantar midfoot musculature. Soft tissues Moderate forefoot subcutaneous fat dorsal soft tissue swelling. IMPRESSION: 1. There may be a small ulcer dorsal lateral to the fifth metatarsophalangeal joint no definite osteomyelitis is visualized within the fifth ray. 2. There is increased STIR signal within the distal phalanges of the second and third toes on sagittal STIR sequence (series 11). However no adjacent ulceration is seen. Recommend clinical correlation for any clinical suspicion for infection in the second and third toe. 3. 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 4. Moderate diffuse plantar and dorsal muscle edema. This is nonspecific myositis. 5. Moderate diffuse soft tissue swelling consistent with cellulitis. Electronically Signed   By: Yvonne Kendall M.D.   On: 01/16/2022 20:44    ECHOCARDIOGRAM COMPLETE  Result Date: 01/16/2022    ECHOCARDIOGRAM REPORT   Patient Name:   [name]. Date of Exam: 01/16/2022 Medical Rec #:  [id number]            Height:       72.0 in Accession #:    [id number]           Weight:       199.5 lb Date of Birth:  1950/12/16            BSA:          2.128 m Patient Age:    15 years             BP:           149/66 mmHg Patient Gender: M                    HR:           89 bpm. Exam Location:  Inpatient Procedure: 2D Echo, Cardiac Doppler and Color Doppler Indications:    Shock  History:        Patient has prior history of Echocardiogram examinations, most                 recent 12/19/2021. Risk Factors:Hypertension.  Sonographer:    Jefferey Pica Referring Phys: [place]  1. Left ventricular ejection fraction, by estimation, is 65 to 70%. The left ventricle has normal function. The left ventricle has no regional wall motion abnormalities. There is mild  concentric left ventricular hypertrophy. Left ventricular diastolic parameters are consistent with Grade I diastolic dysfunction (impaired relaxation).  2. Right ventricular systolic function is normal. The right ventricular size is normal. There is normal pulmonary artery systolic pressure. The estimated right ventricular systolic pressure is 88.8 mmHg.  3. Left atrial size was mildly dilated.  4. The mitral valve is normal in structure. Trivial mitral valve regurgitation.  5. The aortic valve was not well visualized. Aortic valve regurgitation is not visualized.  6. The inferior vena cava is dilated in size with >50% respiratory variability, suggesting right atrial pressure of 8 mmHg. Comparison(s): This study is not off-axis. No wall motion abnormalities. LV is more vigorous from prior. FINDINGS  Left Ventricle: Left ventricular ejection fraction, by estimation, is 65 to 70%. The left ventricle has normal function. The left ventricle has no regional wall motion abnormalities.  The left ventricular internal cavity size was normal in size. There is  mild concentric left ventricular hypertrophy. Left ventricular diastolic parameters are consistent with Grade I diastolic dysfunction (impaired relaxation). Right Ventricle: The right ventricular size is normal. No increase in right ventricular wall thickness. Right ventricular systolic function is normal. There is normal pulmonary artery systolic pressure. The tricuspid regurgitant velocity is 2.04 m/s, and  with an assumed right atrial pressure of 8 mmHg, the estimated right ventricular systolic pressure is 91.6 mmHg. Left Atrium: Left atrial size was mildly dilated. Right Atrium: Right atrial size was normal in size. Pericardium: There is no evidence of pericardial effusion. Mitral Valve: The mitral valve is normal in structure. Trivial mitral valve regurgitation. Tricuspid Valve: The tricuspid valve is not well visualized. Tricuspid valve regurgitation is not demonstrated. No evidence of tricuspid stenosis. Aortic Valve: The aortic valve was not well visualized. Aortic valve regurgitation is not visualized. Aortic valve peak gradient measures 8.2 mmHg. Pulmonic Valve: The pulmonic valve was not well visualized. Pulmonic valve regurgitation is not visualized. No evidence of pulmonic stenosis. Aorta: The aortic root and ascending aorta are structurally normal, with no evidence of dilitation. Venous: The inferior vena cava is dilated in size with greater than 50% respiratory variability, suggesting right atrial pressure of 8 mmHg. IAS/Shunts: No atrial level shunt detected by color flow Doppler.  LEFT VENTRICLE PLAX 2D LVIDd:         5.00 cm   Diastology LVIDs:         3.60 cm   LV e' medial:    8.70 cm/s LV PW:         1.10 cm   LV E/e' medial:  10.9 LV IVS:        1.10 cm   LV e' lateral:   9.57 cm/s LVOT diam:     2.10 cm   LV E/e' lateral: 9.9 LV SV:         92 LV SV Index:   43 LVOT Area:     3.46 cm  RIGHT VENTRICLE             IVC RV S  prime:     16.20 cm/s  IVC diam: 2.20 cm TAPSE (M-mode): 3.1 cm LEFT ATRIUM             Index        RIGHT ATRIUM           Index LA diam:        4.30 cm 2.02 cm/m   RA Area:     15.50 cm LA Vol (A2C):   78.0  ml 36.65 ml/m  RA Volume:   38.20 ml  17.95 ml/m LA Vol (A4C):   61.7 ml 28.99 ml/m LA Biplane Vol: 76.0 ml 35.71 ml/m  AORTIC VALVE                 PULMONIC VALVE AV Area (Vmax): 3.78 cm     PV Vmax:       1.03 m/s AV Vmax:        143.00 cm/s  PV Peak grad:  4.2 mmHg AV Peak Grad:   8.2 mmHg LVOT Vmax:      156.00 cm/s LVOT Vmean:     77.800 cm/s LVOT VTI:       0.267 m  AORTA Ao Root diam: 3.70 cm Ao Asc diam:  3.50 cm MITRAL VALVE               TRICUSPID VALVE MV Area (PHT): 5.09 cm    TR Peak grad:   16.6 mmHg MV Decel Time: 149 msec    TR Vmax:        204.00 cm/s MV E velocity: 94.90 cm/s MV A velocity: 88.80 cm/s  SHUNTS MV E/A ratio:  1.07        Systemic VTI:  0.27 m                            Systemic Diam: 2.10 cm Rudean Haskell MD Electronically signed by Rudean Haskell MD Signature Date/Time: 01/16/2022/1:51:55 PM    Final    [place] EKG SITE RITE  Result Date: 01/16/2022 If Site Rite image not attached, placement could not be confirmed due to current cardiac rhythm.  DG Abd 1 View  Result Date: 01/15/2022 CLINICAL DATA:  [id number].  Nasogastric tube placement. EXAM: ABDOMEN - 1 VIEW COMPARISON:  X-ray abdomen 12/18/2021 FINDINGS: Enteric tube courses below the hemidiaphragm with tip and side port overlying the gastric lumen. Pigtail drain noted overlying the right upper quadrant. Gaseous dilatation of the small bowel measuring up to 4.2 cm. No radio-opaque calculi or other significant radiographic abnormality are seen. IMPRESSION: 1. Enteric tube in good position. 2. Gaseous dilatation of the small bowel. Electronically Signed   By: Iven Finn M.D.   On: 01/15/2022 22:48   CT FOOT LEFT WO CONTRAST  Result Date: 01/15/2022 CLINICAL DATA:  Left foot swelling in a diabetic.  Osteomyelitis suggested. EXAM: CT OF THE LEFT FOOT WITHOUT CONTRAST TECHNIQUE: Multidetector CT imaging of the left foot was performed according to the standard protocol. Multiplanar CT image reconstructions were also generated. RADIATION DOSE REDUCTION: This exam was performed according to the departmental dose-optimization program which includes automated exposure control, adjustment of the mA and/or kV according to patient size and/or use of iterative reconstruction technique. COMPARISON:  MRI left ankle 05/26/2006 FINDINGS: Bones/Joint/Cartilage Degenerative changes in the interphalangeal and intertarsal joints. No acute fracture is identified. No focal bone lesion or bone destruction. No cortical loss or sclerosis to suggest osteomyelitis. Ligaments Suboptimally assessed by CT. Muscles and Tendons Grossly intact. Soft tissues Stranding and infiltration along the dorsum of the foot in the subcutaneous soft tissues. This could represent edema or cellulitis. No loculated collections are seen. IMPRESSION: 1. Edema in the left foot, mostly along the dorsal surface. No loculated collections. 2. Degenerative changes in the interphalangeal and intertarsal joints. 3. No bone changes that would suggest osteomyelitis although MRI is more sensitive for this determination. Electronically Signed   By: Oren Beckmann.D.  On: 01/15/2022 20:01   CT ABDOMEN PELVIS WO CONTRAST  Result Date: 01/15/2022 CLINICAL DATA:  Abdominal pain, acute, non localized. Altered mental status. Hypotension. EXAM: CT ABDOMEN AND PELVIS WITHOUT CONTRAST TECHNIQUE: Multidetector CT imaging of the abdomen and pelvis was performed following the standard protocol without IV contrast. RADIATION DOSE REDUCTION: This exam was performed according to the departmental dose-optimization program which includes automated exposure control, adjustment of the mA and/or kV according to patient size and/or use of iterative reconstruction technique.  COMPARISON:  Abdominal ultrasound that CT the abdomen pelvis 12/22/2021. FINDINGS: Lower chest: Bibasilar dependent airspace opacities are present. The heart size is normal. Blood volume is hypodense. Hepatobiliary: Liver is unremarkable. Cholecystostomy tube present. The gallbladder is decompressed. Layering stones are present. Mild wall thickening is present. Pericholecystic fluid no longer seen. No new fluid collections are present. Pancreas: Unremarkable. No pancreatic ductal dilatation or surrounding inflammatory changes. Spleen: Normal in size without focal abnormality. Adrenals/Urinary Tract: Adrenal glands are normal bilaterally. Kidneys are unremarkable. Vascular calcifications noted. No stone or mass lesion is present. No obstruction present. Ureters are within normal limits bilaterally. The urinary bladder is normal. Stomach/Bowel: The stomach and duodenum are within normal limits. Small bowel is unremarkable. Terminal ileum is within normal limits. The appendix is visualized and normal. The ascending and transverse colon are within normal limits. Descending colon is unremarkable. Diverticular changes are present in the sigmoid colon without inflammation. Moderate stool is present at the rectum. Transverse diameter is 7.8 cm. Vascular/Lymphatic: Dense atherosclerotic calcifications are present at the aorta without aneurysm. Dense calcifications are present at the origins of the celiac artery and superior mesenteric artery. Dense calcifications are present at the origins of both renal arteries. No significant adenopathy is present. Reproductive: Prostate is unremarkable. Other: No abdominal wall hernia or abnormality. No abdominopelvic ascites. Musculoskeletal: Rightward curvature is present in the lumbar spine L2 superior endplate remote compression fracture noted. Vacuum disc in seen at L5-S1 with slight degenerative anterolisthesis. Central canal stenosis is greatest at L3-4 and L4-5. No focal osseous  lesions are present. Bony pelvis is normal. Left hip is unremarkable. Intramedullary densities are present on the right. Question remote osteomyelitis. IMPRESSION: 1. Cholecystostomy tube in place with decompression of the gallbladder. 2. Layering stones are present in the gallbladder. Mild wall thickening is present. No new fluid collections are present. No evidence for complication. 3. Moderate stool at the rectum without obstruction. 4. Sigmoid diverticulosis without diverticulitis. 5. Dense atherosclerotic disease including dense calcifications at the origins of the celiac artery and superior mesenteric artery and renal arteries. 6. Remote L2 superior endplate compression fracture. 7. Central canal stenosis is greatest at L3-4 and L4-5. 8. Intramedullary densities on the right. Question remote osteomyelitis. 9. Bibasilar dependent airspace opacities are present. 10. Aortic Atherosclerosis (ICD10-I70.0). Electronically Signed   By: San Morelle M.D.   On: 01/15/2022 16:20   DG Chest Port 1 View  Result Date: 01/15/2022 CLINICAL DATA:  Questionable sepsis.  Failure to thrive and fever. EXAM: PORTABLE CHEST 1 VIEW COMPARISON:  01/03/2022 and older exams. FINDINGS: Cardiac silhouette is normal in size. No mediastinal or hilar masses. Clear lungs.  No pleural effusion or pneumothorax. Skeletal structures are grossly intact. IMPRESSION: No active disease. Electronically Signed   By: Lajean Manes M.D.   On: 01/15/2022 15:49   DG CHEST PORT 1 VIEW  Result Date: 01/03/2022 CLINICAL DATA:  Shortness of breath EXAM: PORTABLE CHEST 1 VIEW COMPARISON:  January 02, 2022 FINDINGS: The heart size and mediastinal  contours are stable. The heart size is enlarged. There is probable mild patchy consolidation of the left lung base. There is no pulmonary edema or pleural effusion. The visualized skeletal structures are stable. IMPRESSION: Probable mild patchy consolidation of the left lung base, developing pneumonia is not  excluded. Electronically Signed   By: Abelardo Diesel M.D.   On: 01/03/2022 07:36      Subjective: Patient seen and examined at the bedside this morning.  On full comfort care, sleeping.  Not in kind of distress  Discharge Exam: Vitals:   01/31/22 2324 02/01/22 0844  BP: 112/65 (!) 123/59  Pulse: 98 94  Resp: (!) 21 20  Temp: 98.7 F (37.1 C) 98.3 F (36.8 C)  SpO2: 94% 96%   Vitals:   01/31/22 1130 01/31/22 1958 01/31/22 2324 02/01/22 0844  BP: (!) 115/54 (!) 133/58 112/65 (!) 123/59  Pulse: (!) 102 94 98 94  Resp: 20 19 (!) 21 20  Temp: 98.2 F (36.8 C) 98.6 F (37 C) 98.7 F (37.1 C) 98.3 F (36.8 C)  TempSrc: Oral Oral Oral Oral  SpO2: 93% 95% 94% 96%  Weight:      Height:        General: Sleeping comfortably    The results of significant diagnostics from this hospitalization (including imaging, microbiology, ancillary and laboratory) are listed below for reference.     Microbiology: No results found for this or any previous visit (from the past 240 hour(s)).   Labs: BNP (last 3 results) Recent Labs    12/18/21 1124  BNP 315.1*   Basic Metabolic Panel: Recent Labs  Lab 01/28/22 0513 01/28/22 1500 01/29/22 0420 01/30/22 0411  NA 140  --  137 139  K 2.6* 2.9* 4.9 3.1*  CL 95*  --  96* 91*  CO2 34*  --  34* 35*  GLUCOSE 156*  --  116* 117*  BUN 8  --  11 11  CREATININE 0.47*  --  0.58* 0.50*  CALCIUM 8.4*  --  8.4* 8.6*  MG 1.2*  --  1.6* 1.7   Liver Function Tests: No results for input(s): "AST", "ALT", "ALKPHOS", "BILITOT", "PROT", "ALBUMIN" in the last 168 hours. No results for input(s): "LIPASE", "AMYLASE" in the last 168 hours. Recent Labs  Lab 01/28/22 0513  AMMONIA 28   CBC: Recent Labs  Lab 01/27/22 0410 01/28/22 0513 01/29/22 0420 01/30/22 0411 02/01/22 0721  WBC 2.3* 1.8* 2.4* 1.8* 2.1*  HGB 7.8* 7.3* 7.7* 7.2* 7.6*  HCT 22.4* 22.2* 23.0* 21.6* 22.6*  MCV 92.9 94.1 91.3 91.1 91.9  PLT 18* 22* 27* 26* 18*   Cardiac  Enzymes: No results for input(s): "CKTOTAL", "CKMB", "CKMBINDEX", "TROPONINI" in the last 168 hours. BNP: Invalid input(s): "POCBNP" CBG: Recent Labs  Lab 01/31/22 1126 01/31/22 1649 01/31/22 2053 02/01/22 0553 02/01/22 0824  GLUCAP 149* 132* 159* 133* 154*   D-Dimer No results for input(s): "DDIMER" in the last 72 hours. Hgb A1c No results for input(s): "HGBA1C" in the last 72 hours. Lipid Profile No results for input(s): "CHOL", "HDL", "LDLCALC", "TRIG", "CHOLHDL", "LDLDIRECT" in the last 72 hours. Thyroid function studies No results for input(s): "TSH", "T4TOTAL", "T3FREE", "THYROIDAB" in the last 72 hours.  Invalid input(s): "FREET3" Anemia work up No results for input(s): "VITAMINB12", "FOLATE", "FERRITIN", "TIBC", "IRON", "RETICCTPCT" in the last 72 hours. Urinalysis    Component Value Date/Time   COLORURINE AMBER (A) 01/15/2022 1412   APPEARANCEUR HAZY (A) 01/15/2022 1412   LABSPEC 1.012 01/15/2022 1412  PHURINE 5.0 01/15/2022 1412   GLUCOSEU NEGATIVE 01/15/2022 1412   GLUCOSEU NEGATIVE 01/06/2015 0912   HGBUR NEGATIVE 01/15/2022 1412   BILIRUBINUR NEGATIVE 01/15/2022 1412   KETONESUR NEGATIVE 01/15/2022 1412   PROTEINUR NEGATIVE 01/15/2022 1412   UROBILINOGEN 1.0 01/06/2015 0912   NITRITE NEGATIVE 01/15/2022 1412   LEUKOCYTESUR NEGATIVE 01/15/2022 1412   Sepsis Labs Recent Labs  Lab 01/28/22 0513 01/29/22 0420 01/30/22 0411 02/01/22 0721  WBC 1.8* 2.4* 1.8* 2.1*   Microbiology No results found for this or any previous visit (from the past 240 hour(s)).  Please note: You were cared for by a hospitalist during your hospital stay. Once you are discharged, your primary care physician will handle any further medical issues. Please note that NO REFILLS for any discharge medications will be authorized once you are discharged, as it is imperative that you return to your primary care physician (or establish a relationship with a primary care physician if you do  not have one) for your post hospital discharge needs so that they can reassess your need for medications and monitor your lab values.    Time coordinating discharge: 40 minutes  SIGNED:   Shelly Coss, MD  [facility] 02/01/2022, 10:20 AM Pager [phone]  If 7PM-7AM, please contact night-coverage [email or web address] Password [email or web address]

## 2022-02-01 NOTE — Progress Notes (Signed)
Pt d/c'd from [facility] to [facility]. Tele and IV accesses d/c'd. Report called to nurse Alvia Grove at facility. Pt home medication oxycodone retrieved and sent with pt to facility. Nurse aware of home meds being sent. Attempted to notify son by phone. Awaiting return call.  Cyndee Giammarco A Javonn Gauger

## 2022-02-02 ENCOUNTER — Telehealth: Payer: Self-pay

## 2022-02-02 NOTE — Telephone Encounter (Signed)
Patient is hospice care. No transitional of care at this time.

## 2022-02-20 ENCOUNTER — Other Ambulatory Visit: Payer: Self-pay

## 2022-02-20 DIAGNOSIS — D61818 Other pancytopenia: Secondary | ICD-10-CM

## 2022-02-20 DIAGNOSIS — D696 Thrombocytopenia, unspecified: Secondary | ICD-10-CM

## 2022-02-20 DIAGNOSIS — D649 Anemia, unspecified: Secondary | ICD-10-CM

## 2022-02-21 ENCOUNTER — Inpatient Hospital Stay: Payer: Medicare Other

## 2022-02-21 ENCOUNTER — Inpatient Hospital Stay: Payer: Medicare Other | Admitting: Oncology

## 2022-02-27 DEATH — deceased

## 2022-05-28 ENCOUNTER — Encounter (INDEPENDENT_AMBULATORY_CARE_PROVIDER_SITE_OTHER): Payer: Self-pay
